# Patient Record
Sex: Female | Born: 1955 | Race: White | Hispanic: No | Marital: Married | Smoking: Former smoker
Health system: Southern US, Community
[De-identification: ages and names within clinical notes are randomized; demographics above are authoritative.]

## PROBLEM LIST (undated history)

## (undated) DIAGNOSIS — E785 Hyperlipidemia, unspecified: Secondary | ICD-10-CM

## (undated) DIAGNOSIS — J449 Chronic obstructive pulmonary disease, unspecified: Secondary | ICD-10-CM

## (undated) DIAGNOSIS — F32A Depression, unspecified: Secondary | ICD-10-CM

## (undated) DIAGNOSIS — I2510 Atherosclerotic heart disease of native coronary artery without angina pectoris: Secondary | ICD-10-CM

## (undated) DIAGNOSIS — F329 Major depressive disorder, single episode, unspecified: Secondary | ICD-10-CM

## (undated) DIAGNOSIS — J189 Pneumonia, unspecified organism: Secondary | ICD-10-CM

## (undated) DIAGNOSIS — A310 Pulmonary mycobacterial infection: Secondary | ICD-10-CM

## (undated) DIAGNOSIS — Z923 Personal history of irradiation: Secondary | ICD-10-CM

## (undated) DIAGNOSIS — F419 Anxiety disorder, unspecified: Secondary | ICD-10-CM

## (undated) DIAGNOSIS — R0989 Other specified symptoms and signs involving the circulatory and respiratory systems: Secondary | ICD-10-CM

## (undated) DIAGNOSIS — I219 Acute myocardial infarction, unspecified: Secondary | ICD-10-CM

## (undated) HISTORY — DX: Personal history of irradiation: Z92.3

## (undated) HISTORY — DX: Depression, unspecified: F32.A

## (undated) HISTORY — DX: Anxiety disorder, unspecified: F41.9

## (undated) HISTORY — DX: Major depressive disorder, single episode, unspecified: F32.9

## (undated) HISTORY — DX: Other specified symptoms and signs involving the circulatory and respiratory systems: R09.89

## (undated) HISTORY — DX: Chronic obstructive pulmonary disease, unspecified: J44.9

## (undated) HISTORY — DX: Hyperlipidemia, unspecified: E78.5

---
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
## 1984-04-04 HISTORY — PX: NASAL SINUS SURGERY: SHX719

## 2003-05-17 ENCOUNTER — Emergency Department (HOSPITAL_COMMUNITY): Admission: AD | Admit: 2003-05-17 | Discharge: 2003-05-17 | Payer: Self-pay | Admitting: Emergency Medicine

## 2008-11-03 ENCOUNTER — Encounter: Admission: RE | Admit: 2008-11-03 | Discharge: 2008-11-03 | Payer: Self-pay | Admitting: Internal Medicine

## 2010-06-03 ENCOUNTER — Ambulatory Visit (HOSPITAL_COMMUNITY)
Admission: RE | Admit: 2010-06-03 | Discharge: 2010-06-03 | Disposition: A | Discharge status: Home / Self Care | Source: Ambulatory Visit | Attending: Internal Medicine | Admitting: Internal Medicine

## 2010-06-03 ENCOUNTER — Other Ambulatory Visit (HOSPITAL_COMMUNITY)
Admission: RE | Admit: 2010-06-03 | Discharge: 2010-06-03 | Disposition: A | Discharge status: Home / Self Care | Source: Ambulatory Visit | Attending: Internal Medicine | Admitting: Internal Medicine

## 2010-06-03 ENCOUNTER — Other Ambulatory Visit (HOSPITAL_COMMUNITY): Payer: Self-pay | Admitting: Internal Medicine

## 2010-06-03 ENCOUNTER — Other Ambulatory Visit: Payer: Self-pay | Admitting: Internal Medicine

## 2010-06-03 DIAGNOSIS — J449 Chronic obstructive pulmonary disease, unspecified: Secondary | ICD-10-CM

## 2010-06-03 DIAGNOSIS — Z01419 Encounter for gynecological examination (general) (routine) without abnormal findings: Secondary | ICD-10-CM | POA: Insufficient documentation

## 2010-06-03 DIAGNOSIS — F17200 Nicotine dependence, unspecified, uncomplicated: Secondary | ICD-10-CM

## 2010-06-03 DIAGNOSIS — J4489 Other specified chronic obstructive pulmonary disease: Secondary | ICD-10-CM | POA: Insufficient documentation

## 2011-09-29 ENCOUNTER — Ambulatory Visit (HOSPITAL_COMMUNITY)
Admission: RE | Admit: 2011-09-29 | Discharge: 2011-09-29 | Disposition: A | Discharge status: Home / Self Care | Source: Ambulatory Visit | Attending: Internal Medicine | Admitting: Internal Medicine

## 2011-09-29 ENCOUNTER — Other Ambulatory Visit (HOSPITAL_COMMUNITY): Payer: Self-pay | Admitting: Internal Medicine

## 2011-09-29 DIAGNOSIS — J449 Chronic obstructive pulmonary disease, unspecified: Secondary | ICD-10-CM | POA: Insufficient documentation

## 2011-09-29 DIAGNOSIS — R634 Abnormal weight loss: Secondary | ICD-10-CM

## 2011-09-29 DIAGNOSIS — F17200 Nicotine dependence, unspecified, uncomplicated: Secondary | ICD-10-CM | POA: Insufficient documentation

## 2011-09-29 DIAGNOSIS — J4489 Other specified chronic obstructive pulmonary disease: Secondary | ICD-10-CM | POA: Insufficient documentation

## 2012-06-05 ENCOUNTER — Ambulatory Visit (HOSPITAL_COMMUNITY)
Admission: RE | Admit: 2012-06-05 | Discharge: 2012-06-05 | Disposition: A | Discharge status: Home / Self Care | Source: Ambulatory Visit | Attending: Internal Medicine | Admitting: Internal Medicine

## 2012-06-05 ENCOUNTER — Other Ambulatory Visit (HOSPITAL_COMMUNITY): Payer: Self-pay | Admitting: Internal Medicine

## 2012-06-05 DIAGNOSIS — R059 Cough, unspecified: Secondary | ICD-10-CM | POA: Insufficient documentation

## 2012-06-05 DIAGNOSIS — Z87891 Personal history of nicotine dependence: Secondary | ICD-10-CM | POA: Insufficient documentation

## 2012-06-05 DIAGNOSIS — J4489 Other specified chronic obstructive pulmonary disease: Secondary | ICD-10-CM | POA: Insufficient documentation

## 2012-06-05 DIAGNOSIS — R079 Chest pain, unspecified: Secondary | ICD-10-CM | POA: Insufficient documentation

## 2012-06-05 DIAGNOSIS — J449 Chronic obstructive pulmonary disease, unspecified: Secondary | ICD-10-CM | POA: Insufficient documentation

## 2012-06-06 ENCOUNTER — Encounter (HOSPITAL_COMMUNITY): Payer: Self-pay | Admitting: Emergency Medicine

## 2012-06-06 ENCOUNTER — Emergency Department (HOSPITAL_COMMUNITY)

## 2012-06-06 ENCOUNTER — Emergency Department (HOSPITAL_COMMUNITY)
Admission: EM | Admit: 2012-06-06 | Discharge: 2012-06-06 | Disposition: A | Discharge status: Home / Self Care | Attending: Emergency Medicine | Admitting: Emergency Medicine

## 2012-06-06 DIAGNOSIS — IMO0002 Reserved for concepts with insufficient information to code with codable children: Secondary | ICD-10-CM | POA: Insufficient documentation

## 2012-06-06 DIAGNOSIS — F17200 Nicotine dependence, unspecified, uncomplicated: Secondary | ICD-10-CM | POA: Insufficient documentation

## 2012-06-06 DIAGNOSIS — J029 Acute pharyngitis, unspecified: Secondary | ICD-10-CM | POA: Insufficient documentation

## 2012-06-06 DIAGNOSIS — J189 Pneumonia, unspecified organism: Secondary | ICD-10-CM

## 2012-06-06 DIAGNOSIS — J159 Unspecified bacterial pneumonia: Secondary | ICD-10-CM | POA: Insufficient documentation

## 2012-06-06 DIAGNOSIS — Z79899 Other long term (current) drug therapy: Secondary | ICD-10-CM | POA: Insufficient documentation

## 2012-06-06 DIAGNOSIS — R52 Pain, unspecified: Secondary | ICD-10-CM | POA: Insufficient documentation

## 2012-06-06 LAB — BASIC METABOLIC PANEL
BUN: 10 mg/dL (ref 6–23)
CO2: 25 mEq/L (ref 19–32)
Calcium: 9 mg/dL (ref 8.4–10.5)
GFR calc non Af Amer: >90 mL/min (ref >90)
Glucose, Bld: 105 mg/dL — ABNORMAL HIGH (ref 70–99)

## 2012-06-06 LAB — CBC WITH DIFFERENTIAL/PLATELET
Eosinophils Absolute: 0 10*3/uL (ref 0.0–0.7)
Eosinophils Relative: 0 % (ref 0–5)
HCT: 38.3 % (ref 36.0–46.0)
Hemoglobin: 13.2 g/dL (ref 12.0–15.0)
Lymphs Abs: 1.9 10*3/uL (ref 0.7–4.0)
MCH: 31 pg (ref 26.0–34.0)
MCV: 89.9 fL (ref 78.0–100.0)
Monocytes Relative: 9 % (ref 3–12)
RBC: 4.26 MIL/uL (ref 3.87–5.11)

## 2012-06-06 MED ORDER — KETOROLAC TROMETHAMINE 30 MG/ML IJ SOLN
30.0000 mg | Freq: Once | INTRAMUSCULAR | Status: AC
  Administered 2012-06-06: 30 mg via INTRAVENOUS
  Filled 2012-06-06: qty 1

## 2012-06-06 MED ORDER — FLUTICASONE-SALMETEROL 250-50 MCG/DOSE IN AEPB: 1.0000 | INHALATION_SPRAY | Freq: Every morning | RESPIRATORY_TRACT | Status: DC

## 2012-06-06 MED ORDER — KETOROLAC TROMETHAMINE 10 MG PO TABS: 10.0000 mg | ORAL_TABLET | Freq: Four times a day (QID) | ORAL | Status: DC | PRN

## 2012-06-06 MED ORDER — ONDANSETRON HCL 4 MG/2ML IJ SOLN
4.0000 mg | Freq: Once | INTRAMUSCULAR | Status: AC
  Administered 2012-06-06: 4 mg via INTRAVENOUS
  Filled 2012-06-06: qty 2

## 2012-06-06 MED ORDER — LEVOFLOXACIN IN D5W 750 MG/150ML IV SOLN
750.0000 mg | Freq: Once | INTRAVENOUS | Status: AC
  Administered 2012-06-06: 750 mg via INTRAVENOUS
  Filled 2012-06-06: qty 150

## 2012-06-06 MED ORDER — SODIUM CHLORIDE 0.9 % IV BOLUS (SEPSIS)
1000.0000 mL | Freq: Once | INTRAVENOUS | Status: AC
  Administered 2012-06-06: 1000 mL via INTRAVENOUS

## 2012-06-06 NOTE — ED Notes (Signed)
Pt states she was dx w/ pneumonia yesterday and was given oral abx.  Has taken two pills and states that she doesn't feel better yet.  C/o gen body aches.  Pain 6/10.

## 2012-06-06 NOTE — ED Provider Notes (Signed)
History     CSN: [id number]  Arrival date & time 06/06/12  1216   First MD Initiated Contact with Patient 06/06/12 1253      Chief Complaint  Patient presents with  . Pneumonia  . referred by PCP     (Consider location/radiation/quality/duration/timing/severity/associated sxs/prior treatment) HPI Comments: 57 year old female with a history of tobacco use but no other significant medical history who presents with a complaint of coughing and body aches. She has an associated sore throat. Her symptoms were gradual in onset 5 days ago, they have been persistent, her body aches involved are her lower back, bilateral thighs and bilateral upper extremities. She has no swelling, rashes. The cough has been productive of a green phlegm. Yesterday she had a chest x-ray which showed a possible left-sided pneumonia, she states that her family Dr. told her today that her blood work was abnormal and to seek further evaluation at the hospital. The patient states that she is just not improving though she does not feel worse than yesterday. She has been compliant with her Levaquin therapy.  The history is provided by the patient and the spouse.    History reviewed. No pertinent past medical history.  Past Surgical History  Procedure Laterality Date  . Nasal sinus surgery      History reviewed. No pertinent family history.  History  Substance Use Topics  . Smoking status: Current Every Day Smoker -- 0.50 packs/day  . Smokeless tobacco: Not on file  . Alcohol Use: Yes    OB History   Grav Para Term Preterm Abortions TAB SAB Ect Mult Living                  Review of Systems  All other systems reviewed and are negative.    Allergies  Review of patient's allergies indicates no known allergies.  Home Medications   Current Outpatient Rx  Name  Route  Sig  Dispense  Refill  . buPROPion (WELLBUTRIN XL) 300 MG 24 hr tablet   Oral   Take 300 mg by mouth every morning.         .  Cyanocobalamin (VITAMIN B 12 PO)   Oral   Take 1 tablet by mouth every morning.         . Fluticasone-Salmeterol (ADVAIR) 250-50 MCG/DOSE AEPB   Inhalation   Inhale 1 puff into the lungs every morning.         Marland Kitchen ibuprofen (ADVIL) 200 MG tablet   Oral   Take 400 mg by mouth every 8 (eight) hours as needed for fever.         . Multiple Vitamin (MULTIVITAMIN WITH MINERALS) TABS   Oral   Take 1 tablet by mouth every morning.         . vitamin C (ASCORBIC ACID) 500 MG tablet   Oral   Take 500 mg by mouth every morning.         Marland Kitchen ketorolac (TORADOL) 10 MG tablet   Oral   Take 1 tablet (10 mg total) by mouth every 6 (six) hours as needed for pain.   20 tablet   0     BP 134/80  Pulse 87  Temp(Src) 98.9 F (37.2 C) (Oral)  Resp 20  SpO2 95%  Physical Exam  Nursing note and vitals reviewed. Constitutional: She appears well-developed and well-nourished. No distress.  HENT:  Head: Normocephalic and atraumatic.  Mouth/Throat: No oropharyngeal exudate.  Nasal passages congested, pharynx erythematous without asymmetry exudate  hypertrophy  Eyes: Conjunctivae and EOM are normal. Pupils are equal, round, and reactive to light. Right eye exhibits no discharge. Left eye exhibits no discharge. No scleral icterus.  Neck: Normal range of motion. Neck supple. No JVD present. No thyromegaly present.  Cardiovascular: Normal rate, regular rhythm, normal heart sounds and intact distal pulses.  Exam reveals no gallop and no friction rub.   No murmur heard. Pulmonary/Chest: Effort normal and breath sounds normal. No respiratory distress. She has no wheezes. She has no rales.  Overall the lungs are very clear with only a slight expiratory wheeze.  Abdominal: Soft. Bowel sounds are normal. She exhibits no distension and no mass. There is no tenderness.  Musculoskeletal: Normal range of motion. She exhibits no edema and no tenderness.  Lymphadenopathy:    She has no cervical adenopathy.   Neurological: She is alert. Coordination normal.  Skin: Skin is warm and dry. No rash noted. No erythema.  Psychiatric: She has a normal mood and affect. Her behavior is normal.    ED Course  Procedures (including critical care time)  Labs Reviewed  CBC WITH DIFFERENTIAL - Abnormal; Notable for the following:    Platelets 148 (*)    All other components within normal limits  BASIC METABOLIC PANEL - Abnormal; Notable for the following:    Glucose, Bld 105 (*)    All other components within normal limits   Dg Chest 2 View  06/05/2012  *RADIOLOGY REPORT*  Clinical Data: Cough and left-sided chest pain.  History of tobacco use.  CHEST - 2 VIEW  Comparison: 09/29/2011  Findings: Stable hyperinflation and chronic lung disease.  There may be a very subtle infiltrate in the left infrahilar lung.  No edema or pleural fluid is identified.  Heart size and mediastinal contours are within normal limits.  IMPRESSION: Possible subtle acute infiltrate in the left infrahilar lung. Stable COPD.   Original Report Authenticated By: Irish Lack, M.D.      1. CAP (community acquired pneumonia)       MDM  The vital signs are overall very normal without fever or tachycardia or hypotension. Oxygenation 95% to 100% on room air. I have personally reviewed her x-ray from yesterday and find her to be a very small infiltrate in the left lung. Her blood work is totally normal without leukocytosis, renal dysfunction or electrolyte abnormalities. I discussed with the patient her findings and she appears stable for discharge. She has been given intravenous Levaquin prior to discharge as well as intravenous Toradol which has helped her pain significantly.  The patient appears stable to followup with her family Dr. I doubt worsening pneumonia and would consider that this is possibly a viral process such as influenza given her myalgias and upper respiratory symptoms.        Vida Roller, MD 06/06/12 [phone]

## 2012-06-06 NOTE — ED Notes (Signed)
Pharmacy at bedside

## 2012-07-19 ENCOUNTER — Other Ambulatory Visit (HOSPITAL_COMMUNITY): Payer: Self-pay | Admitting: Internal Medicine

## 2012-07-19 ENCOUNTER — Ambulatory Visit (HOSPITAL_COMMUNITY)
Admission: RE | Admit: 2012-07-19 | Discharge: 2012-07-19 | Disposition: A | Discharge status: Home / Self Care | Source: Ambulatory Visit | Attending: Internal Medicine | Admitting: Internal Medicine

## 2012-07-19 DIAGNOSIS — J189 Pneumonia, unspecified organism: Secondary | ICD-10-CM

## 2012-07-19 DIAGNOSIS — R0989 Other specified symptoms and signs involving the circulatory and respiratory systems: Secondary | ICD-10-CM | POA: Insufficient documentation

## 2012-07-19 DIAGNOSIS — R05 Cough: Secondary | ICD-10-CM | POA: Insufficient documentation

## 2012-07-19 DIAGNOSIS — J449 Chronic obstructive pulmonary disease, unspecified: Secondary | ICD-10-CM | POA: Insufficient documentation

## 2012-07-19 DIAGNOSIS — R059 Cough, unspecified: Secondary | ICD-10-CM | POA: Insufficient documentation

## 2012-07-19 DIAGNOSIS — J4489 Other specified chronic obstructive pulmonary disease: Secondary | ICD-10-CM | POA: Insufficient documentation

## 2012-07-19 DIAGNOSIS — F17200 Nicotine dependence, unspecified, uncomplicated: Secondary | ICD-10-CM | POA: Insufficient documentation

## 2013-06-01 ENCOUNTER — Other Ambulatory Visit: Payer: Self-pay | Admitting: Physician Assistant

## 2013-06-26 ENCOUNTER — Telehealth: Payer: Self-pay | Admitting: Internal Medicine

## 2013-06-26 NOTE — Telephone Encounter (Signed)
Left message for pt to call.

## 2013-06-26 NOTE — Telephone Encounter (Signed)
LEFT MESSAGE FOR PT TO [facility] WITH [place]

## 2013-07-14 DIAGNOSIS — J449 Chronic obstructive pulmonary disease, unspecified: Secondary | ICD-10-CM | POA: Insufficient documentation

## 2013-07-14 DIAGNOSIS — F419 Anxiety disorder, unspecified: Secondary | ICD-10-CM | POA: Insufficient documentation

## 2013-07-14 DIAGNOSIS — F329 Major depressive disorder, single episode, unspecified: Secondary | ICD-10-CM | POA: Insufficient documentation

## 2013-07-14 DIAGNOSIS — E785 Hyperlipidemia, unspecified: Secondary | ICD-10-CM | POA: Insufficient documentation

## 2013-07-14 DIAGNOSIS — R0989 Other specified symptoms and signs involving the circulatory and respiratory systems: Secondary | ICD-10-CM | POA: Insufficient documentation

## 2013-07-17 ENCOUNTER — Other Ambulatory Visit (HOSPITAL_COMMUNITY)
Admission: RE | Admit: 2013-07-17 | Discharge: 2013-07-17 | Disposition: A | Discharge status: Home / Self Care | Source: Ambulatory Visit | Attending: Physician Assistant | Admitting: Physician Assistant

## 2013-07-17 ENCOUNTER — Ambulatory Visit (INDEPENDENT_AMBULATORY_CARE_PROVIDER_SITE_OTHER): Admitting: Physician Assistant

## 2013-07-17 ENCOUNTER — Encounter: Payer: Self-pay | Admitting: Physician Assistant

## 2013-07-17 VITALS — BP 130/72 | HR 72 | Temp 97.9°F | Resp 16 | Ht 65.5 in | Wt 120.0 lb

## 2013-07-17 DIAGNOSIS — Z1151 Encounter for screening for human papillomavirus (HPV): Secondary | ICD-10-CM | POA: Insufficient documentation

## 2013-07-17 DIAGNOSIS — R0989 Other specified symptoms and signs involving the circulatory and respiratory systems: Secondary | ICD-10-CM

## 2013-07-17 DIAGNOSIS — Z1211 Encounter for screening for malignant neoplasm of colon: Secondary | ICD-10-CM

## 2013-07-17 DIAGNOSIS — Z79899 Other long term (current) drug therapy: Secondary | ICD-10-CM

## 2013-07-17 DIAGNOSIS — Z01419 Encounter for gynecological examination (general) (routine) without abnormal findings: Secondary | ICD-10-CM | POA: Insufficient documentation

## 2013-07-17 DIAGNOSIS — R5383 Other fatigue: Secondary | ICD-10-CM

## 2013-07-17 DIAGNOSIS — F17200 Nicotine dependence, unspecified, uncomplicated: Secondary | ICD-10-CM

## 2013-07-17 DIAGNOSIS — E785 Hyperlipidemia, unspecified: Secondary | ICD-10-CM

## 2013-07-17 DIAGNOSIS — N760 Acute vaginitis: Secondary | ICD-10-CM | POA: Insufficient documentation

## 2013-07-17 DIAGNOSIS — E559 Vitamin D deficiency, unspecified: Secondary | ICD-10-CM

## 2013-07-17 DIAGNOSIS — Z0000 Encounter for general adult medical examination without abnormal findings: Secondary | ICD-10-CM

## 2013-07-17 DIAGNOSIS — Z124 Encounter for screening for malignant neoplasm of cervix: Secondary | ICD-10-CM

## 2013-07-17 DIAGNOSIS — Z1159 Encounter for screening for other viral diseases: Secondary | ICD-10-CM

## 2013-07-17 DIAGNOSIS — R5381 Other malaise: Secondary | ICD-10-CM

## 2013-07-17 DIAGNOSIS — F329 Major depressive disorder, single episode, unspecified: Secondary | ICD-10-CM

## 2013-07-17 DIAGNOSIS — F3289 Other specified depressive episodes: Secondary | ICD-10-CM

## 2013-07-17 DIAGNOSIS — F32A Depression, unspecified: Secondary | ICD-10-CM

## 2013-07-17 DIAGNOSIS — I10 Essential (primary) hypertension: Secondary | ICD-10-CM

## 2013-07-17 DIAGNOSIS — J449 Chronic obstructive pulmonary disease, unspecified: Secondary | ICD-10-CM

## 2013-07-17 LAB — CBC WITH DIFFERENTIAL/PLATELET
BASOS ABS: 0 10*3/uL (ref 0.0–0.1)
Basophils Relative: 0 % (ref 0–1)
Eosinophils Absolute: 0.1 10*3/uL (ref 0.0–0.7)
Eosinophils Relative: 1 % (ref 0–5)
HEMATOCRIT: 41 % (ref 36.0–46.0)
Hemoglobin: 14.1 g/dL (ref 12.0–15.0)
LYMPHS ABS: 1.7 10*3/uL (ref 0.7–4.0)
LYMPHS PCT: 29 % (ref 12–46)
MCH: 30.4 pg (ref 26.0–34.0)
MCHC: 34.4 g/dL (ref 30.0–36.0)
MCV: 88.4 fL (ref 78.0–100.0)
MONO ABS: 0.4 10*3/uL (ref 0.1–1.0)
Monocytes Relative: 7 % (ref 3–12)
NEUTROS ABS: 3.7 10*3/uL (ref 1.7–7.7)
Neutrophils Relative %: 63 % (ref 43–77)
Platelets: 161 10*3/uL (ref 150–400)
RBC: 4.64 MIL/uL (ref 3.87–5.11)
RDW: 13.7 % (ref 11.5–15.5)
WBC: 5.9 10*3/uL (ref 4.0–10.5)

## 2013-07-17 LAB — HEPATIC FUNCTION PANEL
ALK PHOS: 80 U/L (ref 39–117)
ALT: 23 U/L (ref 0–35)
AST: 21 U/L (ref 0–37)
Albumin: 4.2 g/dL (ref 3.5–5.2)
BILIRUBIN INDIRECT: 0.5 mg/dL (ref 0.2–1.2)
BILIRUBIN TOTAL: 0.6 mg/dL (ref 0.2–1.2)
Bilirubin, Direct: 0.1 mg/dL (ref 0.0–0.3)
Total Protein: 6.9 g/dL (ref 6.0–8.3)

## 2013-07-17 LAB — LIPID PANEL
CHOL/HDL RATIO: 2.9 ratio
Cholesterol: 239 mg/dL — ABNORMAL HIGH (ref 0–200)
HDL: 82 mg/dL (ref >39)
LDL CALC: 145 mg/dL — AB (ref 0–99)
Triglycerides: 59 mg/dL (ref <150)
VLDL: 12 mg/dL (ref 0–40)

## 2013-07-17 LAB — VITAMIN B12: VITAMIN B 12: 1079 pg/mL — AB (ref 211–911)

## 2013-07-17 LAB — BASIC METABOLIC PANEL WITH GFR
BUN: 9 mg/dL (ref 6–23)
CHLORIDE: 104 meq/L (ref 96–112)
CO2: 28 mEq/L (ref 19–32)
Calcium: 9.3 mg/dL (ref 8.4–10.5)
Creat: 0.64 mg/dL (ref 0.50–1.10)
GFR, Est African American: >89 mL/min
Glucose, Bld: 93 mg/dL (ref 70–99)
POTASSIUM: 4 meq/L (ref 3.5–5.3)
Sodium: 140 mEq/L (ref 135–145)

## 2013-07-17 LAB — IRON AND TIBC
%SAT: 29 % (ref 20–55)
IRON: 79 ug/dL (ref 42–145)
TIBC: 273 ug/dL (ref 250–470)
UIBC: 194 ug/dL (ref 125–400)

## 2013-07-17 LAB — HEMOGLOBIN A1C
Hgb A1c MFr Bld: 5 % (ref <5.7)
Mean Plasma Glucose: 97 mg/dL (ref <117)

## 2013-07-17 LAB — MAGNESIUM: Magnesium: 1.9 mg/dL (ref 1.5–2.5)

## 2013-07-17 LAB — TSH: TSH: 2.038 u[IU]/mL (ref 0.350–4.500)

## 2013-07-17 LAB — FERRITIN: Ferritin: 170 ng/mL (ref 10–291)

## 2013-07-17 MED ORDER — TRIAMCINOLONE ACETONIDE 0.1 % EX OINT: 1.0000 "application " | TOPICAL_OINTMENT | Freq: Every day | CUTANEOUS | Status: DC

## 2013-07-17 NOTE — Progress Notes (Signed)
Complete Physical HPI 58 y.o. female  presents for a complete physical. 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 She denies chest pain, dizziness.  She states that for the past month she has been having some SOB when she goes to bed, she has been coughing some pale green sputum but very scant. Denies orthopnea, PND however she wakes up 4-5 times a night.  She is not on cholesterol medication. Her cholesterol is at goal. The cholesterol last visit was:  LDL 92, HDL 60.  Patient is on Vitamin D supplement.  42 She denies swelling at the end of her shit in her feet however she states that she has back and leg pain in the morning with some stiffness. Takes Advil occ helps. She has COPD, last CXR was 07/2012. She has stopped smoking since Aug 2nd.  Current Medications:  Current Outpatient Prescriptions on File Prior to Visit  Medication Sig Dispense Refill  . buPROPion (WELLBUTRIN XL) 300 MG 24 hr tablet TAKE 1 TABLET DAILY  30 tablet  0  . Cyanocobalamin (VITAMIN B 12 PO) Take 1 tablet by mouth every morning.      . Fluticasone-Salmeterol (ADVAIR) 250-50 MCG/DOSE AEPB Inhale 1 puff into the lungs every morning.  60 each  1  . ibuprofen (ADVIL) 200 MG tablet Take 400 mg by mouth every 8 (eight) hours as needed for fever.      . Multiple Vitamin (MULTIVITAMIN WITH MINERALS) TABS Take 1 tablet by mouth every morning.      . vitamin C (ASCORBIC ACID) 500 MG tablet Take 500 mg by mouth every morning.       No current facility-administered medications on file prior to visit.   Health Maintenance:   Tetanus:declines Pneumovax: declines Flu vaccine: declines Zostavax: declines Pap: 2012,  1 abnormal 40 years ago, due today MGM: 2014 due this year in July DEXA: Higher risk due to tobacco history and BMI, no fractures, family history will wait.  Colonoscopy: DUE EGD:  N/A  Allergies: No Known Allergies Medical History:  Past Medical History  Diagnosis Date  . Depression   . Anxiety   . COPD (chronic obstructive pulmonary disease)   . Hyperlipidemia   . Labile hypertension    Surgical History:  Past Surgical History  Procedure Laterality Date  . Nasal sinus surgery     Family History:  Family History  Problem Relation Age of Onset  . Heart attack Mother   . Emphysema Mother   . Heart attack Father   . Asthma Other    Social History:  History  Substance Use Topics  . Smoking status: Current Every Day Smoker -- 0.50 packs/day  . Smokeless tobacco: Not on file  . Alcohol Use: Yes     Review of Systems: [X]  = complains of  [ ]  = denies  General: Fatigue Valu.Nieves ] Fever [ ]  Chills [ ]  Weakness [ ]   Insomnia [X ]Weight change [ ]  Night sweats [ ]   Change in appetite [ ]  Eyes: Redness [ ]  Blurred vision [ ]  Diplopia [ ]  Discharge [ ]   ENT: Congestion [ ]  Sinus Pain [ ]  Post Nasal Drip [ ]  Sore Throat [ ]  Earache [ ]  hearing loss [ ]  Tinnitus [ ]  Snoring Valu.Nieves ]  Cardiac: Chest pain/pressure [ ]  SOB [ ]  Orthopnea [ ]   Palpitations [ ]   Paroxysmal nocturnal dyspnea[ ]  Claudication [ ]  Edema [ ]   Pulmonary: Cough Valu.Nieves ] Wheezing[ ]   SOB Valu.Nieves ]  Pleurisy [ ]   GI: Nausea [ ]  Vomiting[ ]  Dysphagia[ ]  Heartburn[ ]  Abdominal pain [ ]  Constipation [ ] ; Diarrhea [ ]  BRBPR [ ]  Melena[ ]  Bloating [ ]  Hemorrhoids [ ]   GU: Hematuria[ ]  Dysuria [ ]  Nocturia[ ]  Urgency [ ]   Hesitancy [ ]  Discharge [ ]  Frequency [ ]   Breast:  Breast lumps [ ]   nipple discharge [ ]    Neuro: Headaches[ ]  Vertigo[ ]  Paresthesias[ ]  Spasm [ ]  Speech changes [ ]  Incoordination [ ]   Ortho: Arthritis Valu.Nieves ] Joint pain Valu.Nieves ] Muscle pain [ X] Joint swelling [ ]  Back Pain Valu.Nieves ] Skin:  Rash [ ]   Pruritis [ ]  Change in skin lesion [ ]   Psych: Depression[ ]  Anxiety[ ]  Confusion [ ]  Memory loss [ ]   Heme/Lypmh: Bleeding [ ]  Bruising [ ]  Enlarged lymph nodes [ ]   Endocrine: Visual blurring [ ]   Paresthesia [ ]  Polyuria [ ]  Polydypsea [ ]    Heat/cold intolerance [ ]  Hypoglycemia [ ]   Physical Exam: Estimated body mass index is 19.66 kg/(m^2) as calculated from the following:   Height as of this encounter: 5' 5.5" (1.664 m).   Weight as of this encounter: 120 lb (54.432 kg). BP 130/72  Pulse 72  Temp(Src) 97.9 F (36.6 C)  Resp 16  Ht 5' 5.5" (1.664 m)  Wt 120 lb (54.432 kg)  BMI 19.66 kg/m2 General Appearance: Well nourished, in no apparent distress. Eyes: PERRLA, EOMs, conjunctiva no swelling or erythema, normal fundi and vessels. Sinuses: No Frontal/maxillary tenderness ENT/Mouth: Ext aud canals clear, normal light reflex with TMs without erythema, bulging.  Good dentition. No erythema, swelling, or exudate on post pharynx. Tonsils not swollen or erythematous. Hearing normal.  Neck: Supple, thyroid normal. No bruits Respiratory: Respiratory effort normal, , wheezing left sided, BS equal bilaterally without rales, rhonchi, or stridor. Cardio: RRR without murmurs, rubs or gallops. Brisk peripheral pulses without edema.  Chest: symmetric, with normal excursions and percussion. Breasts: Symmetric, without lumps, nipple discharge, retractions. Abdomen: Soft, +BS. Non tender, no guarding, rebound, hernias, masses, or organomegaly. .  Lymphatics: Non tender without lymphadenopathy.  Genitourinary: VULVA: normal appearing vulva with no masses, tenderness or lesions, vulvar hypopigmentation, VAGINA: normal appearing vagina with normal color and discharge, no lesions, vaginal discharge - white, malodorous and thick, CERVIX: normal appearing cervix without discharge or lesions, PAP: Pap smear done today, WET MOUNT done - results: pending. Musculoskeletal: Full ROM all peripheral extremities,5/5 strength, and normal gait. Skin: Warm, dry without rashes, lesions, ecchymosis.  Neuro: Cranial nerves intact, reflexes equal bilaterally. Normal muscle tone, no cerebellar symptoms. Sensation  intact.  Psych: Awake and oriented X 3, normal affect, Insight and Judgment appropriate.   EKG: WNL no changes.  Assessment and Plan: 1. Labile hypertension controlled - [place], RETROPERITNL ABD,  LTD  2. COPD (chronic obstructive pulmonary disease) Smoking cessation discussed - DG Chest 2 View; Future  3. Depression Continue meds  4. Hyperlipidemia -continue medications, check lipids, decrease fatty foods, increase activity.   5. Routine general medical examination at a health care facility - CBC with Differential - BASIC METABOLIC PANEL WITH GFR - Hepatic function panel - Lipid panel - TSH - Hemoglobin A1c - Vit D  25 hydroxy (rtn osteoporosis monitoring) - Urinalysis, Routine w reflex microscopic - Microalbumin / creatinine urine ratio - Vitamin B12 - Magnesium -  Iron and TIBC - Ferritin - EKG 12-Lead  6. Other malaise and fatigue Crowded mouth, HTN, fatigue, snoring, SOB, frequent awakenings - Ambulatory referral to Sleep Studies  7. Screening for cervical cancer - Cytology - PAP  8. Colon cancer screening - Ambulatory referral to Gastroenterology  9. Screening for viral disease - Hepatitis A antibody, total - Hepatitis B core antibody, total - Hepatitis B e antibody - Hepatitis B surface antibody - Hepatitis C antibody  10. Vaginitis and vulvovaginitis - Cytology - PAP   Discussed med's effects and SE's. Screening labs and tests as requested with regular follow-up as recommended.   [name] 9:23 AM

## 2013-07-18 ENCOUNTER — Encounter: Payer: Self-pay | Admitting: Gastroenterology

## 2013-07-18 LAB — URINALYSIS, ROUTINE W REFLEX MICROSCOPIC
Bilirubin Urine: NEGATIVE
GLUCOSE, UA: NEGATIVE mg/dL
HGB URINE DIPSTICK: NEGATIVE
KETONES UR: NEGATIVE mg/dL
Nitrite: NEGATIVE
PH: 6 (ref 5.0–8.0)
Protein, ur: NEGATIVE mg/dL
Specific Gravity, Urine: 1.007 (ref 1.005–1.030)
Urobilinogen, UA: 0.2 mg/dL (ref 0.0–1.0)

## 2013-07-18 LAB — HEPATITIS C ANTIBODY: HCV Ab: NEGATIVE

## 2013-07-18 LAB — HEPATITIS B CORE ANTIBODY, TOTAL: Hep B Core Total Ab: NONREACTIVE

## 2013-07-18 LAB — MICROALBUMIN / CREATININE URINE RATIO
Creatinine, Urine: 24 mg/dL
MICROALB/CREAT RATIO: 20.8 mg/g (ref 0.0–30.0)
Microalb, Ur: 0.5 mg/dL (ref 0.00–1.89)

## 2013-07-18 LAB — URINALYSIS, MICROSCOPIC ONLY
Bacteria, UA: NONE SEEN
CASTS: NONE SEEN
Crystals: NONE SEEN
SQUAMOUS EPITHELIAL / LPF: NONE SEEN

## 2013-07-18 LAB — VITAMIN D 25 HYDROXY (VIT D DEFICIENCY, FRACTURES): Vit D, 25-Hydroxy: 48 ng/mL (ref 30–89)

## 2013-07-18 LAB — HEPATITIS B SURFACE ANTIBODY,QUALITATIVE: Hep B S Ab: NEGATIVE

## 2013-07-18 LAB — HEPATITIS A ANTIBODY, TOTAL: HEP A TOTAL AB: REACTIVE — AB

## 2013-07-19 LAB — HEPATITIS B E ANTIBODY: Hepatitis Be Antibody: NONREACTIVE

## 2013-07-22 LAB — CERVICOVAGINAL ANCILLARY ONLY
Bacterial vaginitis: POSITIVE — AB
CANDIDA VAGINITIS: NEGATIVE

## 2013-07-23 ENCOUNTER — Other Ambulatory Visit: Payer: Self-pay

## 2013-07-23 MED ORDER — METRONIDAZOLE 500 MG PO TABS: ORAL_TABLET | ORAL | Status: DC

## 2013-07-23 MED ORDER — FLUTICASONE-SALMETEROL 250-50 MCG/DOSE IN AEPB: 1.0000 | INHALATION_SPRAY | Freq: Every morning | RESPIRATORY_TRACT | Status: DC

## 2013-07-23 NOTE — Addendum Note (Signed)
Addended by: [name] R on: 07/23/2013 08:42 AM   Modules accepted: Orders

## 2013-08-14 ENCOUNTER — Ambulatory Visit (AMBULATORY_SURGERY_CENTER): Payer: Self-pay | Admitting: *Deleted

## 2013-08-14 VITALS — Ht 64.0 in | Wt 118.6 lb

## 2013-08-14 DIAGNOSIS — Z1211 Encounter for screening for malignant neoplasm of colon: Secondary | ICD-10-CM

## 2013-08-14 MED ORDER — MOVIPREP 100 G PO SOLR: ORAL | Status: DC

## 2013-08-14 NOTE — Progress Notes (Signed)
No allergies to eggs or soy. No problems with anesthesia.  Pt given Emmi instructions for colonoscopy  No oxygen use  No diet drug use  

## 2013-08-28 ENCOUNTER — Ambulatory Visit (AMBULATORY_SURGERY_CENTER): Admitting: Gastroenterology

## 2013-08-28 ENCOUNTER — Encounter: Payer: Self-pay | Admitting: Gastroenterology

## 2013-08-28 VITALS — BP 136/67 | HR 56 | Temp 98.6°F | Resp 40 | Ht 60.0 in | Wt 118.0 lb

## 2013-08-28 DIAGNOSIS — D126 Benign neoplasm of colon, unspecified: Secondary | ICD-10-CM

## 2013-08-28 DIAGNOSIS — Z1211 Encounter for screening for malignant neoplasm of colon: Secondary | ICD-10-CM

## 2013-08-28 MED ORDER — SODIUM CHLORIDE 0.9 % IV SOLN: 500.0000 mL | INTRAVENOUS | Status: DC

## 2013-08-28 NOTE — Progress Notes (Signed)
Called to room to assist during endoscopic procedure.  Patient ID and intended procedure confirmed with present staff. Received instructions for my participation in the procedure from the performing physician.  

## 2013-08-28 NOTE — Op Note (Signed)
[place]  [address] [place], [address]   COLONOSCOPY PROCEDURE REPORT  PATIENT: Katrina Campbell, Katrina Campbell  MR#: [id number] BIRTHDATE: 1955/09/15 , 79  yrs. old GENDER: Female ENDOSCOPIST: Milus Banister, MD REFERRED MP:NTIRWER Melford Aase, M.D. PROCEDURE DATE:  08/28/2013 PROCEDURE:   Colonoscopy with snare polypectomy First Screening Colonoscopy - Avg.  risk and is 50 yrs.  old or older Yes.  Prior Negative Screening - Now for repeat screening. N/A  History of Adenoma - Now for follow-up colonoscopy & has been > or = to 3 yrs.  N/A  Polyps Removed Today? Yes. ASA CLASS:   Class II INDICATIONS:average risk screening. MEDICATIONS: MAC sedation, administered by CRNA and Propofol (Diprivan) 270 mg IV  DESCRIPTION OF PROCEDURE:   After the risks benefits and alternatives of the procedure were thoroughly explained, informed consent was obtained.  A digital rectal exam revealed no abnormalities of the rectum.   The [id number]  endoscope was introduced through the anus and advanced to the cecum, which was identified by both the appendix and ileocecal valve. No adverse events experienced.   The quality of the prep was excellent.  The instrument was then slowly withdrawn as the colon was fully examined.   COLON FINDINGS: One polyp was found, removed and sent to pathology. This was sessile, 38mm across, located in transverse segment, removed with cold snare.  The examination was otherwise normal. Retroflexed views revealed no abnormalities. The time to cecum=5 minutes 20 seconds.  Withdrawal time=9 minutes 41 seconds.  The scope was withdrawn and the procedure completed. COMPLICATIONS: There were no complications.  ENDOSCOPIC IMPRESSION: One polyp was found, removed and sent to pathology. The examination was otherwise normal.  RECOMMENDATIONS: If the polyp(s) removed today are proven to be adenomatous (pre-cancerous) polyps, you will need a repeat colonoscopy in  5 years.  Otherwise you should continue to follow colorectal cancer screening guidelines for "routine risk" patients with colonoscopy in 10 years.  You will receive a letter within 1-2 weeks with the results of your biopsy as well as final recommendations.  Please call my office if you have not received a letter after 3 weeks.   eSigned:  Milus Banister, MD 08/28/2013 10:05 AM

## 2013-08-28 NOTE — Patient Instructions (Signed)
Discharge instructions given with verbal understanding. Handout on polyps. Resume previous medications. YOU HAD AN ENDOSCOPIC PROCEDURE TODAY AT [facility]: Refer to the procedure report that was given to you for any specific questions about what was found during the examination.  If the procedure report does not answer your questions, please call your gastroenterologist to clarify.  If you requested that your care partner not be given the details of your procedure findings, then the procedure report has been included in a sealed envelope for you to review at your convenience later.  YOU SHOULD EXPECT: Some feelings of bloating in the abdomen. Passage of more gas than usual.  Walking can help get rid of the air that was put into your GI tract during the procedure and reduce the bloating. If you had a lower endoscopy (such as a colonoscopy or flexible sigmoidoscopy) you may notice spotting of blood in your stool or on the toilet paper. If you underwent a bowel prep for your procedure, then you may not have a normal bowel movement for a few days.  DIET: Your first meal following the procedure should be a light meal and then it is ok to progress to your normal diet.  A half-sandwich or bowl of soup is an example of a good first meal.  Heavy or fried foods are harder to digest and may make you feel nauseous or bloated.  Likewise meals heavy in dairy and vegetables can cause extra gas to form and this can also increase the bloating.  Drink plenty of fluids but you should avoid alcoholic beverages for 24 hours.  ACTIVITY: Your care partner should take you home directly after the procedure.  You should plan to take it easy, moving slowly for the rest of the day.  You can resume normal activity the day after the procedure however you should NOT DRIVE or use heavy machinery for 24 hours (because of the sedation medicines used during the test).    SYMPTOMS TO REPORT IMMEDIATELY: A  gastroenterologist can be reached at any hour.  During normal business hours, 8:30 AM to 5:00 PM Monday through Friday, call [phone].  After hours and on weekends, please call the GI answering service at [phone] who will take a message and have the physician on call contact you.   Following lower endoscopy (colonoscopy or flexible sigmoidoscopy):  Excessive amounts of blood in the stool  Significant tenderness or worsening of abdominal pains  Swelling of the abdomen that is new, acute  Fever of 100F or higher  FOLLOW UP: If any biopsies were taken you will be contacted by phone or by letter within the next 1-3 weeks.  Call your gastroenterologist if you have not heard about the biopsies in 3 weeks.  Our staff will call the home number listed on your records the next business day following your procedure to check on you and address any questions or concerns that you may have at that time regarding the information given to you following your procedure. This is a courtesy call and so if there is no answer at the home number and we have not heard from you through the emergency physician on call, we will assume that you have returned to your regular daily activities without incident.  SIGNATURES/CONFIDENTIALITY: You and/or your care partner have signed paperwork which will be entered into your electronic medical record.  These signatures attest to the fact that that the information above on your After Visit Summary has been  reviewed and is understood.  Full responsibility of the confidentiality of this discharge information lies with you and/or your care-partner.

## 2013-08-28 NOTE — Progress Notes (Signed)
Pt stable to RR 

## 2013-08-29 ENCOUNTER — Telehealth: Payer: Self-pay | Admitting: *Deleted

## 2013-08-29 NOTE — Telephone Encounter (Signed)
  Follow up Call-  Call back number 08/28/2013  Post procedure Call Back phone  # [phone]  Permission to leave phone message Yes    [facility]

## 2013-09-03 ENCOUNTER — Encounter: Payer: Self-pay | Admitting: Gastroenterology

## 2013-12-13 ENCOUNTER — Other Ambulatory Visit: Payer: Self-pay | Admitting: Physician Assistant

## 2014-01-13 ENCOUNTER — Ambulatory Visit (INDEPENDENT_AMBULATORY_CARE_PROVIDER_SITE_OTHER): Admitting: Internal Medicine

## 2014-01-13 ENCOUNTER — Encounter: Payer: Self-pay | Admitting: Internal Medicine

## 2014-01-13 VITALS — BP 132/80 | HR 68 | Temp 98.2°F | Resp 18 | Ht 65.5 in | Wt 123.0 lb

## 2014-01-13 DIAGNOSIS — J4541 Moderate persistent asthma with (acute) exacerbation: Secondary | ICD-10-CM

## 2014-01-13 MED ORDER — PREDNISONE 20 MG PO TABS: ORAL_TABLET | ORAL | Status: DC

## 2014-01-13 MED ORDER — UMECLIDINIUM BROMIDE 62.5 MCG/INH IN AEPB: 1.0000 | INHALATION_SPRAY | Freq: Every day | RESPIRATORY_TRACT | Status: DC

## 2014-01-13 NOTE — Progress Notes (Signed)
   Subjective:    Patient ID: Katrina Campbell, female    DOB: 01-09-1956, 58 y.o.   MRN: [id number]  HPI very nice 58 yo reformed smoker treated at an Urgent care ~ 1  mo ago w/Levaquin(?) and more recently given an Rx for a Z pak & a cephalosporin. Describes a productive cough.   PMHx (+) COPD, HLD & labile HTN Meds/All - reviewed  Review of Systems  All systems reviewed negative except as above.   Objective:   Physical Exam  BP 132/80  Pulse 68  Temp 98.2 F Resp 18  Ht 5' 5.5"   Wt 123 lb  BMI 20.15 kg/m2  O2 Sat = 94-96 %  HEENT - Eac's patent. TM's Nl. EOM's full. PERRLA. NasoOroPharynx clear. Neck - supple. Nl Thyroid. Carotids 2+ & No bruits, nodes, JVD Chest - Bilat coarse Rales, rhonchi and  Wheezes. No resp Distress.  Cor - Nl HS. RRR w/o sig MGR. PP 1(+). No edema. Abd - No palpable organomegaly, masses or tenderness. BS nl. MS- FROM w/o deformities. Muscle power, tone and bulk Nl. Gait Nl. Neuro - No obvious Cr N abnormalities. Sensory, motor and Cerebellar functions appear Nl w/o focal abnormalities. Psyche - Mental status normal & appropriate.  No delusions, ideations or obvious mood abnormalities.  Assessment & Plan:   1. Asthmatic bronchitis with exacerbation, moderate persistent  - advised start her 2 antibiotics  and given Rx - Prednisone 20 mg #20 Pulse/taper  & Sx/RX - Incruse Ellipta  - ROW - 2 weeks

## 2014-01-13 NOTE — Patient Instructions (Signed)
Chronic Asthmatic Bronchitis Chronic asthmatic bronchitis is a complication of persistent asthma. After a period of time with asthma, some people develop airflow obstruction that is present all the time, even when not having an asthma attack.There is also persistent inflammation of the airways, and the bronchial tubes produce more mucus. Chronic asthmatic bronchitis usually is a permanent problem with the lungs. CAUSES  Chronic asthmatic bronchitis happens most often in people who have asthma and also smoke cigarettes. Occasionally, it can happen to a person with long-standing or severe asthma even if the person is not a smoker. SIGNS AND SYMPTOMS  Chronic asthmatic bronchitis usually causes symptoms of both asthma and chronic bronchitis, including:   Coughing.  Increased sputum production.  Wheezing and shortness of breath.  Chest discomfort.  Recurring infections. DIAGNOSIS  Your health care provider will take a medical history and perform a physical exam. Chronic asthmatic bronchitis is suspected when a person with asthma has abnormal results on breathing tests (pulmonary function tests) even when breathing symptoms are at their best. Other tests, such as a chest X-ray, may be performed to rule out other conditions.  TREATMENT  Treatment involves controlling symptoms with medicine and lifestyle changes.  Your health care provider may prescribe asthma medicines, including inhaler and nebulizer medicines.  Infection can be treated with medicine to kill germs (antibiotics). Serious infections may require hospitalization. These can include:  Pneumonia.  Sinus infections.  Acute bronchitis.   Preventing infection and hospitalization is very important. Get an influenza vaccination every year as directed by your health care provider. Ask your health care provider whether you need a pneumonia vaccine.  Ask your health care provider whether you would benefit from a pulmonary  rehabilitation program. HOME CARE INSTRUCTIONS  Take medicines only as directed by your health care provider.  If you are a cigarette smoker, the most important thing that you can do is quit. Talk to your health care provider for help with quitting smoking.  Avoid pollen, dust, animal dander, molds, smoke, and other things that cause attacks.  Regular exercise is very important to help you feel better. Discuss possible exercise routines with your health care provider.  If animal dander is the cause of asthma, you may not be able to keep pets.  It is important that you:  Become educated about your medical condition.  Participate in maintaining wellness.  Seek medical care as directed. Delay in seeking medical care could cause permanent injury and may be a risk to your life. SEEK MEDICAL CARE IF:  You have wheezing and shortness of breath even if taking medicine to prevent attacks.  You have muscle aches, chest pain, or thickening of sputum.  Your sputum changes from clear or white to yellow, green, gray, or bloody. SEEK IMMEDIATE MEDICAL CARE IF:  Your usual medicines do not stop your wheezing.  You have increased coughing or shortness of breath or both.  You have increased difficulty breathing.  You have any problems from the medicine you are taking, such as a rash, itching, swelling, or trouble breathing. MAKE SURE YOU:   Understand these instructions.  Will watch your condition.  Will get help right away if you are not doing well or get worse. Document Released: 01/06/2006 Document Revised: 08/05/2013 Document Reviewed: 04/29/2013 [facility] Patient Information 2015 [place], [place]. This information is not intended to replace advice given to you by your health care provider. Make sure you discuss any questions you have with your health care provider.

## 2014-01-16 ENCOUNTER — Other Ambulatory Visit: Payer: Self-pay | Admitting: Internal Medicine

## 2014-01-16 MED ORDER — UMECLIDINIUM BROMIDE 62.5 MCG/INH IN AEPB
62.5000 ug | INHALATION_SPRAY | Freq: Every day | RESPIRATORY_TRACT | Status: AC
Start: 2014-01-16 — End: 2014-07-18

## 2014-01-22 ENCOUNTER — Other Ambulatory Visit: Payer: Self-pay | Admitting: *Deleted

## 2014-01-22 MED ORDER — BUPROPION HCL ER (XL) 300 MG PO TB24: ORAL_TABLET | ORAL | Status: DC

## 2014-01-23 ENCOUNTER — Telehealth: Payer: Self-pay | Admitting: *Deleted

## 2014-01-23 NOTE — Telephone Encounter (Signed)
Patient advised of normal CT Chest with no sign of cancer.

## 2014-01-28 ENCOUNTER — Ambulatory Visit (INDEPENDENT_AMBULATORY_CARE_PROVIDER_SITE_OTHER): Admitting: Internal Medicine

## 2014-01-28 ENCOUNTER — Encounter: Payer: Self-pay | Admitting: Internal Medicine

## 2014-01-28 VITALS — BP 112/70 | HR 68 | Temp 98.1°F | Resp 16 | Ht 65.5 in | Wt 127.8 lb

## 2014-01-28 DIAGNOSIS — B370 Candidal stomatitis: Secondary | ICD-10-CM

## 2014-01-28 DIAGNOSIS — J4531 Mild persistent asthma with (acute) exacerbation: Secondary | ICD-10-CM

## 2014-01-28 DIAGNOSIS — J042 Acute laryngotracheitis: Secondary | ICD-10-CM

## 2014-01-28 MED ORDER — AZITHROMYCIN 250 MG PO TABS: ORAL_TABLET | ORAL | Status: DC

## 2014-01-28 MED ORDER — FLUCONAZOLE 100 MG PO TABS: 100.0000 mg | ORAL_TABLET | Freq: Every day | ORAL | Status: AC

## 2014-01-28 MED ORDER — FLUTICASONE-SALMETEROL 250-50 MCG/DOSE IN AEPB: 1.0000 | INHALATION_SPRAY | Freq: Two times a day (BID) | RESPIRATORY_TRACT | Status: DC

## 2014-01-28 MED ORDER — PREDNISONE 20 MG PO TABS: ORAL_TABLET | ORAL | Status: DC

## 2014-01-28 NOTE — Progress Notes (Signed)
   Subjective:    Patient ID: Katrina Campbell, female    DOB: [birth year], 58 y.o.   MRN: [id number]  HPI  Patient presents for  Week f/u after treatment with a Z pak and Keflex and persistent dry non productive 'wheezy' cough. ! Month prior to that she had been treated with Levaquin. Denies fever chills or sputum pdn. CT scan Chest done 01/22/2014 showed COPD w/o pulmonary nodules. Patient relates smoking cessation about 1 year ago ( Has hx/o smoking ~ 1&1/2 ppd from age 14 yo 24 yrs til quitting age 58 = 34 Pk Yrs.) Was seen recently by Dr Redmond Baseman for ENT exam with Dx/o Ritta Slot and prescribed a "liquid" for Thrush.   Medication Sig  . buPROPion (WELLBUTRIN XL) 300 MG 24 hr tablet TAKE 1 TABLET DAILY  . calcium carbonate (OS-CAL) 600 MG TABS tablet Take 600 mg by mouth daily with breakfast.  . Cyanocobalamin (VITAMIN B 12 PO) Take 1 tablet by mouth every morning.  . fluticasone (FLONASE) 50 MCG/ACT nasal spray Place 1 spray into both nostrils 2 (two) times daily as needed for allergies or rhinitis.  Marland Kitchen guaiFENesin (MUCINEX) 600 MG 12 hr tablet Take by mouth daily.  Marland Kitchen ibuprofen (ADVIL) 200 MG tablet Take 400 mg by mouth every 8 (eight) hours as needed for fever.  . Multiple Vitamin (MULTIVITAMIN WITH MINERALS) TABS Take 1 tablet by mouth every morning.  Marland Kitchen Umeclidinium Bromide (INCRUSE ELLIPTA) 62.5 MCG/INH AEPB Inhale 62.5 mcg into the lungs daily.  . vitamin C (ASCORBIC ACID) 500 MG tablet Take 500 mg by mouth every morning.  . predniSONE (DELTASONE) 20 MG tablet Completed   No Known Allergies  Past Medical History  Diagnosis Date  . Depression   . Anxiety   . COPD (chronic obstructive pulmonary disease)   . Hyperlipidemia   . Labile hypertension    Review of Systems non contributory to above  Objective:   Physical Exam  BP 112/70  Pulse 68  Temp(Src) 98.1 F (36.7 C) (Temporal)  Resp 16  Ht 5' 5.5" (1.664 m)  Wt 127 lb 12.8 oz (57.97 kg)  BMI 20.94 kg/m2 Raspy wheezy dry cough and  slight hoarseness. O2 sat =  93%  HEENT - Eac's patent. TM's Nl. EOM's full. PERRLA. NasoOroPharynx with fer patches of Thrush. Neck - supple. Nl Thyroid. Carotids 2+ & No bruits, nodes, JVD Chest - Decreased BS w/ scattered dry rales/rhonchi and few fine forced post tussive end expiratory wheezes wheezes. Cor - Nl HS. RRR w/o sig MGR. PP 1(+). No edema. Abd - No palpable organomegaly, masses or tenderness. BS nl. MS- FROM w/o deformities. Muscle power, tone and bulk Nl. Gait Nl. Neuro - No obvious Cr N abnormalities. Sensory, motor and Cerebellar functions appear Nl w/o focal abnormalities. Psyche - Mental status normal & appropriate.  .  Assessment & Plan:   1. Acute laryngitis and tracheitis  - Rx Zpak  Repeat and Prednisone pulse/taper  2. Asthmatic bronchitis, mild persistent, with acute exacerbation  - Continue Advair & Incruse   3. Thrush, oral  - Rx Diflucan 100 mg # 15   ROV 2 weeks to reassess

## 2014-02-11 ENCOUNTER — Encounter: Payer: Self-pay | Admitting: Internal Medicine

## 2014-02-11 ENCOUNTER — Ambulatory Visit (INDEPENDENT_AMBULATORY_CARE_PROVIDER_SITE_OTHER): Admitting: Internal Medicine

## 2014-02-11 VITALS — BP 126/72 | HR 72 | Temp 98.1°F | Resp 16 | Ht 65.5 in | Wt 129.2 lb

## 2014-02-11 DIAGNOSIS — J4541 Moderate persistent asthma with (acute) exacerbation: Secondary | ICD-10-CM

## 2014-02-11 DIAGNOSIS — I10 Essential (primary) hypertension: Secondary | ICD-10-CM

## 2014-02-11 DIAGNOSIS — R0989 Other specified symptoms and signs involving the circulatory and respiratory systems: Secondary | ICD-10-CM

## 2014-02-11 NOTE — Progress Notes (Signed)
   Subjective:    Patient ID: Katrina Campbell, female    DOB: 1956/02/11, 58 y.o.   MRN: [id number]  HPI A very nice 58 yo MWF returning for recheck after protracted exacerbation(s) of bronchitis and 3 courses of antibiotics. Now 2 weeks post treatment with a Z pak/prednisone  and institution of Incruse Elliptica- she is markedly improved with occas dry cough, no sputum pdn and no apparent wheezing. Note patient did stop smoking Aug 2014. Recent CXR showed only COPD.   Medication Sig  . buPROPion XL)300 MG 24 hr tablet TAKE 1 TABLET DAILY  . OS-CAL 600 MG  Take 600 mg by mouth daily with breakfast.  . Cyanocobalamin (VITAMIN B 12 PO) Take 1 tablet by mouth every morning.  . fluticasone (FLONASE)nasal spray Place 1 spray into both nostrils 2  times daily   . ADVAIR DISKUS 250-50  Inhale 1 puff into the lungs 2 (two) times daily.  Marland Kitchen MUCINEX 600 MG 12 hr tablet Take by mouth daily.  Marland Kitchen ibuprofen 200 MG tablet Take 400 mg by mouth every 8 (eight) hours as needed for fever.  . MULTIVITAMIN WITH MINERALS Take 1 tablet by mouth every morning.  Marland Kitchen Umeclidinium Br / INCRUSE ELLIPTA 62.5  Inhale 62.5 mcg into the lungs daily.  . vitamin C (ASCORBIC ACID) 500 MG tablet Take 500 mg by mouth every morning.   No Known Allergies   Past Medical History  Diagnosis Date  . Depression   . Anxiety   . COPD (chronic obstructive pulmonary disease)   . Hyperlipidemia   . Labile hypertension    Review of Systems Negative except as above  Objective:   Physical Exam BP 126/72 mmHg  Pulse 72  Temp(Src) 98.1 F (36.7 C)  Resp 16  Ht 5' 5.5" (1.664 m)  Wt 129 lb 3.2 oz (58.605 kg)  BMI 21.17 kg/m2  HEENT - Negative & normal. Neck - supple. Nl Thyroid. Carotids 2+ & No bruits, nodes, JVD Chest - BS decreased w/o rales, rhonchi, wheezes. Cor - Nl HS. RRR w/o sig MGR. PP 1(+). No edema. MS- FROM w/o deformities. Muscle power, tone and bulk Nl. Gait Nl. Neuro - No obvious w/o focal abnormalities. Psyche -  Mental status normal & appropriate.   Assessment & Plan:   1. Labile hypertension   2. Asthmatic bronchitis with exacerbation, moderate persistent  - Continue meds same and ROV 6 months or prn

## 2014-02-20 ENCOUNTER — Emergency Department (INDEPENDENT_AMBULATORY_CARE_PROVIDER_SITE_OTHER)

## 2014-02-20 ENCOUNTER — Emergency Department (HOSPITAL_COMMUNITY)
Admission: EM | Admit: 2014-02-20 | Discharge: 2014-02-20 | Disposition: A | Discharge status: Home / Self Care | Source: Home / Self Care | Attending: Emergency Medicine | Admitting: Emergency Medicine

## 2014-02-20 ENCOUNTER — Encounter (HOSPITAL_COMMUNITY): Payer: Self-pay | Admitting: Emergency Medicine

## 2014-02-20 DIAGNOSIS — M25579 Pain in unspecified ankle and joints of unspecified foot: Secondary | ICD-10-CM

## 2014-02-20 DIAGNOSIS — M779 Enthesopathy, unspecified: Secondary | ICD-10-CM

## 2014-02-20 MED ORDER — TRAMADOL HCL 50 MG PO TABS: 100.0000 mg | ORAL_TABLET | Freq: Three times a day (TID) | ORAL | Status: DC | PRN

## 2014-02-20 MED ORDER — MELOXICAM 15 MG PO TABS: 15.0000 mg | ORAL_TABLET | Freq: Every day | ORAL | Status: DC

## 2014-02-20 NOTE — Discharge Instructions (Signed)

## 2014-02-20 NOTE — ED Notes (Signed)
Pt states that she was walking about 4 days ago and felt this sharp pain in her ankle that radiated to her leg. Pt states that he ankle started swelling about 2 days ago per pt.

## 2014-02-20 NOTE — ED Provider Notes (Signed)
Chief Complaint   Ankle Pain   History of Present Illness   Katrina Campbell is a 58 year old female who was at work 4 days ago when she developed pain over her right lateral malleolus. She denies any injury. She did not twist her ankle, did not hear a pop. The pain began spontaneously. It's been somewhat swollen since then. It radiates up into the pretibial area and in the calf. The pain is worse with weightbearing and better if she gets off her feet. The patient just finished several courses of antibiotics for pneumonia and was also taking steroids at the same time. She's not sure exactly what the antibiotics were.  Review of Systems   Other than as noted above, the patient denies any of the following symptoms: Systemic:  No fevers or chills.   Musculoskeletal:  No joint pain or swelling, back pain, or neck pain. Neurological:  No muscular weakness or paresthesias.  [place]   Past medical history, family history, social history, meds, and allergies were reviewed.   Physical Examination     Vital signs:  BP 152/74 mmHg  Pulse 64  Temp(Src) 98.5 F (36.9 C) (Oral)  Resp 20  SpO2 100% Gen:  Alert and oriented times 3.  In no distress. Musculoskeletal: Exam of the ankle reveals there is no pain to palpation over the lateral malleolus itself but she has pain both anteriorly and posteriorly. There is no obvious swelling. The ankle has a full range of motion with pain on movement. She has pain on resisted flexion, extension, and internal and external rotation. Anterior drawer sign negative.  Talar tilt negative. Squeeze test negative. Achilles tendon, peroneal tendon, and tibialis posterior were intact. Otherwise, all joints had a full a ROM with no swelling, bruising or deformity.  No edema, pulses full. Extremities were warm and pink.  Capillary refill was brisk. There was no calf tenderness. Homans sign was negative. Skin:  Clear, warm and dry.  No rash. Neuro:  Alert and oriented times 3.   Muscle strength was normal.  Sensation was intact to light touch.   Radiology   Dg Ankle Complete Right  02/20/2014   CLINICAL DATA:  Fall at work 5 days ago. Persistent ankle pain and swelling. Initial encounter.  EXAM: RIGHT ANKLE - COMPLETE 3+ VIEW  COMPARISON:  None.  FINDINGS: There is no evidence of fracture, dislocation, or joint effusion. There is no evidence of arthropathy or other focal bone abnormality. Soft tissues are unremarkable.  IMPRESSION: Negative.   Electronically Signed   By: Earle Gell M.D.   On: 02/20/2014 16:52    I reviewed the images independently and personally and concur with the radiologist's findings.  Course in [facility] [place]   The patient was placed in a cam walker and given crutches.  Assessment   The primary encounter diagnosis was Tendonitis. A diagnosis of Ankle pain was also pertinent to this visit.  Differential diagnosis is stress fracture, tendinitis, or tendon rupture.  Plan     1.  Meds:  The following meds were prescribed:   Discharge Medication List as of 02/20/2014  5:10 PM    START taking these medications   Details  meloxicam (MOBIC) 15 MG tablet Take 1 tablet (15 mg total) by mouth daily., Starting 02/20/2014, Until Discontinued, Normal    traMADol (ULTRAM) 50 MG tablet Take 2 tablets (100 mg total) by mouth every 8 (eight) hours as needed., Starting 02/20/2014, Until Discontinued, Print  2.  Patient Education/Counseling:  The patient was given appropriate handouts, self care instructions, including rest and activity, elevation, application of ice and compression, and instructed in pain control.  No weightbearing or work for right now and follow-up with orthopedics early next week.  3.  Follow up:  The patient was told to follow up here if no better in 3 to 4 days, or sooner if becoming worse in any way, and given some red flag symptoms such as increasing pain or neurological symptoms which would prompt immediate return.       Harden Mo, MD 02/20/14 [phone]

## 2014-02-21 ENCOUNTER — Encounter: Payer: Self-pay | Admitting: *Deleted

## 2014-02-24 ENCOUNTER — Other Ambulatory Visit: Payer: Self-pay | Admitting: *Deleted

## 2014-02-24 MED ORDER — FLUTICASONE-SALMETEROL 250-50 MCG/DOSE IN AEPB: 1.0000 | INHALATION_SPRAY | Freq: Two times a day (BID) | RESPIRATORY_TRACT | Status: DC

## 2014-06-09 ENCOUNTER — Telehealth: Payer: Self-pay | Admitting: *Deleted

## 2014-06-09 NOTE — Telephone Encounter (Signed)
PER pts ortho needs to schedule a BMD due to a fracture back in Nov.... He suggests this but Melissa said back at cpe she had needed one also  Needs MGM also will schedule both after 2 if possible & call pt.  Pt is already a pt at [facility]  Patient =  [phone]

## 2014-06-10 ENCOUNTER — Other Ambulatory Visit: Payer: Self-pay | Admitting: *Deleted

## 2014-06-10 DIAGNOSIS — Z1231 Encounter for screening mammogram for malignant neoplasm of breast: Secondary | ICD-10-CM

## 2014-06-10 DIAGNOSIS — Z780 Asymptomatic menopausal state: Secondary | ICD-10-CM

## 2014-06-10 NOTE — Telephone Encounter (Signed)
Faxed order to [facility] for Mammogram and Bone Density. [facility] to call patient and schedule appointments.

## 2014-06-25 ENCOUNTER — Other Ambulatory Visit: Payer: Self-pay | Admitting: *Deleted

## 2014-06-25 MED ORDER — FLUTICASONE PROPIONATE 50 MCG/ACT NA SUSP: 1.0000 | Freq: Two times a day (BID) | NASAL | Status: DC | PRN

## 2014-07-21 ENCOUNTER — Encounter: Payer: Self-pay | Admitting: Physician Assistant

## 2014-12-10 ENCOUNTER — Ambulatory Visit (INDEPENDENT_AMBULATORY_CARE_PROVIDER_SITE_OTHER): Admitting: Physician Assistant

## 2014-12-10 ENCOUNTER — Encounter: Payer: Self-pay | Admitting: Physician Assistant

## 2014-12-10 VITALS — BP 140/80 | HR 70 | Temp 97.5°F | Resp 16 | Ht 65.5 in | Wt 126.2 lb

## 2014-12-10 DIAGNOSIS — Z79899 Other long term (current) drug therapy: Secondary | ICD-10-CM

## 2014-12-10 DIAGNOSIS — J441 Chronic obstructive pulmonary disease with (acute) exacerbation: Secondary | ICD-10-CM

## 2014-12-10 DIAGNOSIS — K140 Glossitis: Secondary | ICD-10-CM

## 2014-12-10 MED ORDER — PREDNISONE 20 MG PO TABS: ORAL_TABLET | ORAL | Status: DC

## 2014-12-10 MED ORDER — LEVOFLOXACIN 500 MG PO TABS
500.0000 mg | ORAL_TABLET | Freq: Every day | ORAL | Status: DC
Start: 2014-12-10 — End: 2015-02-10

## 2014-12-10 MED ORDER — NYSTATIN 100000 UNIT/ML MT SUSP: 5.0000 mL | Freq: Four times a day (QID) | OROMUCOSAL | Status: DC

## 2014-12-10 MED ORDER — ALBUTEROL SULFATE HFA 108 (90 BASE) MCG/ACT IN AERS: 2.0000 | INHALATION_SPRAY | Freq: Four times a day (QID) | RESPIRATORY_TRACT | Status: DC | PRN

## 2014-12-10 NOTE — Patient Instructions (Signed)
Glossitis Glossitis is an inflammation of the tongue. Changes in the appearance of the tongue may be a primary tongue disorder. This means the problem is only in the tongue. Glossitis may be a symptom of other disorders. CAUSES   Excessive alcohol.  Multiple allergies.  Infections.  Tobacco and nicotine use.  Anemia.  Mechanical injury.  Spicy foods.  Vitamin B deficiency.  Damage from chemicals or hot food or drink. SYMPTOMS  There may be swelling and color changes in the tongue. Sometimes the surface of the tongue may look smooth. This disorder may be painless. But the tongue is usually sore and tender. It can be fiery red if condition is caused by deficiency of B vitamins. It is sometimes pale if there is anemia. Anemia means there are not enough red blood cells. There may be problems with chewing, swallowing and speaking. In some cases, glossitis may result in severe tongue swelling that blocks the airway. DIAGNOSIS  The diagnosis of glossitis is made easily by physical exam and asking for a history. Sometimes blood tests may be done. TREATMENT   The goal of treatment is to reduce inflammation. Hospitalization is usually not necessary unless tongue swelling is severe.  Good oral hygiene is important. This means good tooth brushing at least twice a day, and flossing daily for treatment and prevention.  Corticosteroids such as prednisone may be given to reduce the redness and soreness.  Medications may be prescribed if the cause of glossitis is an infection.  Other problems such as anemia and nutritional deficiencies are treated. This may be a dietary change or vitamin supplements. Avoid hot or spicy foods, alcohol, and tobacco. This lessens the discomfort.  Avoid anything that is irritating to your mouth or tongue. Glossitis usually responds well to treatment if the cause of it is removed or treated.  SEEK IMMEDIATE MEDICAL CARE IF:   Symptoms of glossitis persist for  longer than 10 days.  Tongue swelling is severe and breathing, speaking, chewing, or swallowing difficulties are present.  You have no relief from medications given.  You develop difficulties breathing. Document Released: 03/11/2002 Document Revised: 06/13/2011 Document Reviewed: 04/25/2008 [facility] Patient Information 2015 [place], [place]. This information is not intended to replace advice given to you by your health care provider. Make sure you discuss any questions you have with your health care provider. Chronic Obstructive Pulmonary Disease Chronic obstructive pulmonary disease (COPD) is a common lung problem. In COPD, the flow of air from the lungs is limited. The way your lungs work will probably never return to normal, but there are things you can do to improve your lungs and make yourself feel better. HOME CARE  Take all medicines as told by your doctor.  Avoid medicines or cough syrups that dry up your airway (such as antihistamines) and do not allow you to get rid of thick spit. You do not need to avoid them if told differently by your doctor.  If you smoke, stop. Smoking makes the problem worse.  Avoid being around things that make your breathing worse (like smoke, chemicals, and fumes).  Use oxygen therapy and therapy to help improve your lungs (pulmonary rehabilitation) if told by your doctor. If you need home oxygen therapy, ask your doctor if you should buy a tool to measure your oxygen level (oximeter).  Avoid people who have a sickness you can catch (contagious).  Avoid going outside when it is very hot, cold, or humid.  Eat healthy foods. Eat smaller meals more often.  Rest before meals.  Stay active, but remember to also rest.  Make sure to get all the shots (vaccines) your doctor recommends. Ask your doctor if you need a pneumonia shot.  Learn and use tips on how to relax.  Learn and use tips on how to control your breathing as told by your doctor.  Try:  Breathing in (inhaling) through your nose for 1 second. Then, pucker your lips and breath out (exhale) through your lips for 2 seconds.  Putting one hand on your belly (abdomen). Breathe in slowly through your nose for 1 second. Your hand on your belly should move out. Pucker your lips and breathe out slowly through your lips. Your hand on your belly should move in as you breathe out.  Learn and use controlled coughing to clear thick spit from your lungs. The steps are:  Lean your head a little forward.  Breathe in deeply.  Try to hold your breath for 3 seconds.  Keep your mouth slightly open while coughing 2 times.  Spit any thick spit out into a tissue.  Rest and do the steps again 1 or 2 times as needed. GET HELP IF:  You cough up more thick spit than usual.  There is a change in the color or thickness of the spit.  It is harder to breathe than usual.  Your breathing is faster than usual. GET HELP RIGHT AWAY IF:   You have shortness of breath while resting.  You have shortness of breath that stops you from:  Being able to talk.  Doing normal activities.  You chest hurts for longer than 5 minutes.  Your skin color is more blue than usual.  Your pulse oximeter shows that you have low oxygen for longer than 5 minutes. MAKE SURE YOU:   Understand these instructions.  Will watch your condition.  Will get help right away if you are not doing well or get worse. Document Released: 09/07/2007 Document Revised: 08/05/2013 Document Reviewed: 11/15/2012 [facility] Patient Information 2015 [place], [place]. This information is not intended to replace advice given to you by your health care provider. Make sure you discuss any questions you have with your health care provider.

## 2014-12-10 NOTE — Progress Notes (Signed)
   Subjective:    Patient ID: [name], female    DOB: 1955-04-06, 59 y.o.   MRN: [id number]  HPI 59 y.o. WF with history of COPD (72 year pack smoking history, q 2 years ago), HTN, presents with bronchitis symptoms x 2 weeks. She has sinus pressure, upper back pain, cough with green mucus, fever, chills, SOB, wheezing.  She is on mucinex, advair/incruse, flonase, and has albuterol inhaler but has not used it. Has seen Dr. Redmond Baseman in the past, ENT. She also has tongue tenderness for a long time, feels swollen/tender.   Blood pressure 140/80, pulse 70, temperature 97.5 F (36.4 C), resp. rate 16, height 5' 5.5" (1.664 m), weight 126 lb 3.2 oz (57.244 kg). Current Outpatient Prescriptions on File Prior to Visit  Medication Sig Dispense Refill  . buPROPion (WELLBUTRIN XL) 300 MG 24 hr tablet TAKE 1 TABLET DAILY 90 tablet 4  . calcium carbonate (OS-CAL) 600 MG TABS tablet Take 600 mg by mouth daily with breakfast.    . fluticasone (FLONASE) 50 MCG/ACT nasal spray Place 1 spray into both nostrils 2 (two) times daily as needed for allergies or rhinitis. 16 g 3  . Fluticasone-Salmeterol (ADVAIR DISKUS) 250-50 MCG/DOSE AEPB Inhale 1 puff into the lungs 2 (two) times daily. 180 each 1  . guaiFENesin (MUCINEX) 600 MG 12 hr tablet Take by mouth daily.    Marland Kitchen ibuprofen (ADVIL) 200 MG tablet Take 400 mg by mouth every 8 (eight) hours as needed for fever.    . Multiple Vitamin (MULTIVITAMIN WITH MINERALS) TABS Take 1 tablet by mouth every morning.    . vitamin C (ASCORBIC ACID) 500 MG tablet Take 500 mg by mouth every morning.     No current facility-administered medications on file prior to visit.   Past Medical History  Diagnosis Date  . Depression   . Anxiety   . COPD (chronic obstructive pulmonary disease)   . Hyperlipidemia   . Labile hypertension     Review of Systems  Constitutional: Positive for fatigue. Negative for fever and chills.  HENT: Positive for congestion, rhinorrhea, sinus pressure  and sore throat. Negative for dental problem, ear discharge, ear pain, nosebleeds, trouble swallowing and voice change.   Respiratory: Positive for cough, chest tightness, shortness of breath and wheezing.   Cardiovascular: Negative.   Gastrointestinal: Negative.   Genitourinary: Negative.   Musculoskeletal: Negative.   Neurological: Negative.        Objective:   Physical Exam  Constitutional: She is oriented to person, place, and time. She appears well-developed and well-nourished.  HENT:  Head: Normocephalic and atraumatic.  Right Ear: External ear normal.  Left Ear: External ear normal.  Nose: Nose normal.  Mouth/Throat: Oropharynx is clear and moist.  Eyes: Conjunctivae are normal. Pupils are equal, round, and reactive to light.  Neck: Normal range of motion. Neck supple.  Cardiovascular: Normal rate and regular rhythm.   Pulmonary/Chest: Effort normal. No respiratory distress. She has wheezes (diffuse but worse right lower lobe). She has no rales. She exhibits no tenderness.  Abdominal: Soft. Bowel sounds are normal.  Lymphadenopathy:    She has no cervical adenopathy.  Neurological: She is alert and oriented to person, place, and time.  Skin: Skin is warm and dry.      Assessment & Plan:  AECOPD- Levaquin '500mg'$ , prednisone, albuterol, and continue inhalers.  If worsening SOB, CP go to ER Needs CPE  Glossitis- check labs, will give nystatin mouth wash due to inhaler use.

## 2014-12-11 LAB — BASIC METABOLIC PANEL WITH GFR
BUN: 8 mg/dL (ref 7–25)
CALCIUM: 9.1 mg/dL (ref 8.6–10.4)
CO2: 29 mmol/L (ref 20–31)
CREATININE: 0.59 mg/dL (ref 0.50–0.99)
Chloride: 104 mmol/L (ref 98–110)
GFR, Est Non African American: >89 mL/min (ref >=60)
Glucose, Bld: 97 mg/dL (ref 65–99)
Potassium: 3.9 mmol/L (ref 3.5–5.3)
SODIUM: 145 mmol/L (ref 135–146)

## 2014-12-11 LAB — CBC WITH DIFFERENTIAL/PLATELET
Basophils Absolute: 0 10*3/uL (ref 0.0–0.1)
Basophils Relative: 0 % (ref 0–1)
EOS ABS: 0.1 10*3/uL (ref 0.0–0.7)
EOS PCT: 1 % (ref 0–5)
HCT: 41.8 % (ref 36.0–46.0)
Hemoglobin: 13.7 g/dL (ref 12.0–15.0)
LYMPHS ABS: 2.1 10*3/uL (ref 0.7–4.0)
Lymphocytes Relative: 22 % (ref 12–46)
MCH: 30.2 pg (ref 26.0–34.0)
MCHC: 32.8 g/dL (ref 30.0–36.0)
MCV: 92.1 fL (ref 78.0–100.0)
MONO ABS: 0.5 10*3/uL (ref 0.1–1.0)
MONOS PCT: 5 % (ref 3–12)
MPV: 10.9 fL (ref 8.6–12.4)
Neutro Abs: 6.9 10*3/uL (ref 1.7–7.7)
Neutrophils Relative %: 72 % (ref 43–77)
PLATELETS: 201 10*3/uL (ref 150–400)
RBC: 4.54 MIL/uL (ref 3.87–5.11)
RDW: 13.7 % (ref 11.5–15.5)
WBC: 9.6 10*3/uL (ref 4.0–10.5)

## 2014-12-11 LAB — IRON AND TIBC
%SAT: 26 % (ref 11–50)
Iron: 60 ug/dL (ref 45–160)
TIBC: 229 ug/dL — ABNORMAL LOW (ref 250–450)
UIBC: 169 ug/dL (ref 125–400)

## 2014-12-11 LAB — HEPATIC FUNCTION PANEL
ALBUMIN: 3.9 g/dL (ref 3.6–5.1)
ALT: 18 U/L (ref 6–29)
AST: 18 U/L (ref 10–35)
Alkaline Phosphatase: 87 U/L (ref 33–130)
BILIRUBIN DIRECT: 0.1 mg/dL (ref <=0.2)
BILIRUBIN TOTAL: 0.3 mg/dL (ref 0.2–1.2)
Indirect Bilirubin: 0.2 mg/dL (ref 0.2–1.2)
Total Protein: 6.3 g/dL (ref 6.1–8.1)

## 2014-12-11 LAB — FOLATE RBC: RBC Folate: 593 ng/mL (ref >280)

## 2014-12-11 LAB — FERRITIN: FERRITIN: 128 ng/mL (ref 10–291)

## 2014-12-11 LAB — VITAMIN B12: Vitamin B-12: 1103 pg/mL — ABNORMAL HIGH (ref 211–911)

## 2014-12-16 ENCOUNTER — Other Ambulatory Visit: Payer: Self-pay | Admitting: Internal Medicine

## 2014-12-29 ENCOUNTER — Ambulatory Visit (INDEPENDENT_AMBULATORY_CARE_PROVIDER_SITE_OTHER): Admitting: Physician Assistant

## 2014-12-29 ENCOUNTER — Encounter: Payer: Self-pay | Admitting: Physician Assistant

## 2014-12-29 VITALS — BP 116/78 | HR 72 | Temp 97.5°F | Resp 16 | Ht 65.5 in | Wt 127.0 lb

## 2014-12-29 DIAGNOSIS — Z79899 Other long term (current) drug therapy: Secondary | ICD-10-CM

## 2014-12-29 DIAGNOSIS — F329 Major depressive disorder, single episode, unspecified: Secondary | ICD-10-CM

## 2014-12-29 DIAGNOSIS — E785 Hyperlipidemia, unspecified: Secondary | ICD-10-CM

## 2014-12-29 DIAGNOSIS — E559 Vitamin D deficiency, unspecified: Secondary | ICD-10-CM

## 2014-12-29 DIAGNOSIS — R0989 Other specified symptoms and signs involving the circulatory and respiratory systems: Secondary | ICD-10-CM

## 2014-12-29 DIAGNOSIS — Z0000 Encounter for general adult medical examination without abnormal findings: Secondary | ICD-10-CM | POA: Diagnosis not present

## 2014-12-29 DIAGNOSIS — J449 Chronic obstructive pulmonary disease, unspecified: Secondary | ICD-10-CM

## 2014-12-29 DIAGNOSIS — F32A Depression, unspecified: Secondary | ICD-10-CM

## 2014-12-29 DIAGNOSIS — I10 Essential (primary) hypertension: Secondary | ICD-10-CM

## 2014-12-29 DIAGNOSIS — F419 Anxiety disorder, unspecified: Secondary | ICD-10-CM

## 2014-12-29 LAB — LIPID PANEL
Cholesterol: 185 mg/dL (ref 125–200)
HDL: 68 mg/dL (ref >=46)
LDL CALC: 86 mg/dL (ref <130)
TRIGLYCERIDES: 157 mg/dL — AB (ref <150)
Total CHOL/HDL Ratio: 2.7 Ratio (ref <=5.0)
VLDL: 31 mg/dL — AB (ref <30)

## 2014-12-29 LAB — MAGNESIUM: MAGNESIUM: 2.1 mg/dL (ref 1.5–2.5)

## 2014-12-29 NOTE — Progress Notes (Signed)
Complete Physical   Assessment and Plan: 1. Labile hypertension - continue medications, DASH diet, exercise and monitor at home. Call if greater than 130/80.  - TSH - Urinalysis, Routine w reflex microscopic (not at [facility]) - Microalbumin / creatinine urine ratio - EKG 12-Lead - [place], RETROPERITNL ABD,  LTD  2. Chronic obstructive pulmonary disease, unspecified COPD, unspecified chronic bronchitis type  will get CXR, continue meds.   3. Hyperlipidemia -continue medications, check lipids, decrease fatty foods, increase activity.  - Lipid panel  4. Depression Depression- continue medications, stress management techniques discussed, increase water, good sleep hygiene discussed, increase exercise, and increase veggies.   5. Anxiety Anxiety-  continue medications, stress management techniques discussed, increase water, good sleep hygiene discussed, increase exercise, and increase veggies.   6. Medication management - Magnesium  7. Vitamin D deficiency - Vit D  25 hydroxy (rtn osteoporosis monitoring)   Discussed med's effects and SE's. Screening labs and tests as requested with regular follow-up as recommended.   HPI 59 y.o. female  presents for a complete physical. 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 She denies chest pain, dizziness.  Lab Results  Component Value Date   HGBA1C 5.0 07/17/2013   She is not on cholesterol medication. Her cholesterol is at goal. The cholesterol last visit was:   Lab Results  Component Value Date   CHOL 239* 07/17/2013   HDL 82 07/17/2013   LDLCALC 145* 07/17/2013   TRIG 59 07/17/2013   CHOLHDL 2.9 07/17/2013   Patient is on Vitamin D supplement.   Lab Results  Component Value Date   VD25OH 48 07/17/2013   She denies swelling at the end of her shift in her feet however she states that she has back and leg pain in the  morning with some stiffness. Takes Advil occ helps. She has COPD, last CXR was 07/2012. She has She has stopped smoking since Aug 2nd 2014. Treated for COPD exacerbation 09/07, feeling better, still with cough  Current Medications:  Current Outpatient Prescriptions on File Prior to Visit  Medication Sig Dispense Refill  . ADVAIR DISKUS 250-50 MCG/DOSE AEPB USE 1 INHALATION TWICE A DAY 180 each 1  . albuterol (PROAIR HFA) 108 (90 BASE) MCG/ACT inhaler Inhale 2 puffs into the lungs every 6 (six) hours as needed for wheezing or shortness of breath. 18 g 3  . buPROPion (WELLBUTRIN XL) 300 MG 24 hr tablet TAKE 1 TABLET DAILY 90 tablet 4  . calcium carbonate (OS-CAL) 600 MG TABS tablet Take 600 mg by mouth daily with breakfast.    . fluticasone (FLONASE) 50 MCG/ACT nasal spray Place 1 spray into both nostrils 2 (two) times daily as needed for allergies or rhinitis. 16 g 3  . guaiFENesin (MUCINEX) 600 MG 12 hr tablet Take by mouth daily.    Marland Kitchen ibuprofen (ADVIL) 200 MG tablet Take 400 mg by mouth every 8 (eight) hours as needed for fever.    Marland Kitchen levofloxacin (LEVAQUIN) 500 MG tablet Take 1 tablet (500 mg total) by mouth daily. 10 tablet 0  . Multiple Vitamin (MULTIVITAMIN WITH MINERALS) TABS Take 1 tablet by mouth every morning.    . nystatin (MYCOSTATIN) 100000 UNIT/ML suspension Take 5 mLs (500,000 Units total) by mouth 4 (four) times daily. Swish and swallow 240 mL 1  . predniSONE (DELTASONE) 20 MG tablet 2 tablets daily  for 3 days, 1 tablet daily for 4 days. 10 tablet 0  . vitamin C (ASCORBIC ACID) 500 MG tablet Take 500 mg by mouth every morning.     No current facility-administered medications on file prior to visit.   Health Maintenance:   Tetanus:declines Pneumovax: declines Prevnar 13: declines Flu vaccine: declines Zostavax: declines Pap: 2015,  1 abnormal 40 years ago, due today MGM: 10/2014 DEXA: Higher risk due to tobacco history and BMI, no fractures, family history will wait.   Colonoscopy: 2015 EGD: N/A  Allergies: No Known Allergies Medical History:  Past Medical History  Diagnosis Date  . Depression   . Anxiety   . COPD (chronic obstructive pulmonary disease)   . Hyperlipidemia   . Labile hypertension    Surgical History:  Past Surgical History  Procedure Laterality Date  . Nasal sinus surgery  1986   Family History:  Family History  Problem Relation Age of Onset  . Heart attack Mother   . Emphysema Mother   . Heart attack Father   . Asthma Other   . Colon cancer Neg Hx    Social History:  Social History  Substance Use Topics  . Smoking status: Former Smoker -- 0.50 packs/day  . Smokeless tobacco: Current User    Last Attempt to Quit: 11/02/2012     Comment: vapor  . Alcohol Use: 25.2 oz/week    42 Cans of beer per week   ROS    Physical Exam: Estimated body mass index is 20.81 kg/(m^2) as calculated from the following:   Height as of this encounter: 5' 5.5" (1.664 m).   Weight as of this encounter: 127 lb (57.607 kg). BP 116/78 mmHg  Pulse 72  Temp(Src) 97.5 F (36.4 C)  Resp 16  Ht 5' 5.5" (1.664 m)  Wt 127 lb (57.607 kg)  BMI 20.81 kg/m2 General Appearance: Well nourished, in no apparent distress. Eyes: PERRLA, EOMs, conjunctiva no swelling or erythema, normal fundi and vessels. Sinuses: No Frontal/maxillary tenderness ENT/Mouth: Ext aud canals clear, normal light reflex with TMs without erythema, bulging.  Good dentition. No erythema, swelling, or exudate on post pharynx. Tonsils not swollen or erythematous. Hearing normal.  Neck: Supple, thyroid normal. No bruits Respiratory: Respiratory effort normal, , wheezing left sided, BS equal bilaterally without rales, rhonchi, or stridor. Cardio: RRR without murmurs, rubs or gallops. Brisk peripheral pulses without edema.  Chest: symmetric, with normal excursions and percussion. Breasts: Symmetric, without lumps, nipple discharge, retractions. Abdomen: Soft, +BS. Non tender,  no guarding, rebound, hernias, masses, or organomegaly.  Lymphatics: Non tender without lymphadenopathy.  Musculoskeletal: Full ROM all peripheral extremities,5/5 strength, and normal gait. Skin: Warm, dry without rashes, lesions, ecchymosis.  Neuro: Cranial nerves intact, reflexes equal bilaterally. Normal muscle tone, no cerebellar symptoms. Sensation intact.  Psych: Awake and oriented X 3, normal affect, Insight and Judgment appropriate.   EKG: WNL no changes. Aorta Scan: WNL  [name] 3:16 PM

## 2014-12-30 LAB — URINALYSIS, MICROSCOPIC ONLY
Bacteria, UA: NONE SEEN [HPF]
CRYSTALS: NONE SEEN [HPF]
Casts: NONE SEEN [LPF]
RBC / HPF: NONE SEEN RBC/HPF (ref <=2)
Squamous Epithelial / LPF: NONE SEEN [HPF] (ref <=5)
WBC, UA: NONE SEEN WBC/HPF (ref <=5)
YEAST: NONE SEEN [HPF]

## 2014-12-30 LAB — URINALYSIS, ROUTINE W REFLEX MICROSCOPIC
BILIRUBIN URINE: NEGATIVE
Glucose, UA: NEGATIVE
HGB URINE DIPSTICK: NEGATIVE
KETONES UR: NEGATIVE
NITRITE: NEGATIVE
PH: 6.5 (ref 5.0–8.0)
Protein, ur: NEGATIVE
SPECIFIC GRAVITY, URINE: 1.011 (ref 1.001–1.035)

## 2014-12-30 LAB — MICROALBUMIN / CREATININE URINE RATIO
Creatinine, Urine: 64.4 mg/dL
MICROALB UR: 0.2 mg/dL (ref <2.0)
Microalb Creat Ratio: 3.1 mg/g (ref 0.0–30.0)

## 2014-12-30 LAB — VITAMIN D 25 HYDROXY (VIT D DEFICIENCY, FRACTURES): Vit D, 25-Hydroxy: 50 ng/mL (ref 30–100)

## 2014-12-30 LAB — TSH: TSH: 3.819 u[IU]/mL (ref 0.350–4.500)

## 2015-01-10 ENCOUNTER — Encounter: Payer: Self-pay | Admitting: *Deleted

## 2015-01-26 ENCOUNTER — Other Ambulatory Visit: Payer: Self-pay | Admitting: Physician Assistant

## 2015-01-26 ENCOUNTER — Telehealth: Payer: Self-pay | Admitting: Physician Assistant

## 2015-01-26 DIAGNOSIS — J449 Chronic obstructive pulmonary disease, unspecified: Secondary | ICD-10-CM

## 2015-01-26 DIAGNOSIS — R9389 Abnormal findings on diagnostic imaging of other specified body structures: Secondary | ICD-10-CM

## 2015-01-26 NOTE — Telephone Encounter (Signed)
Patient went to UC over the weekend for cough, had CXR and states there was a mass. Referral made for CT and pulmonary but to [place] and she is requesting to have those things done here. Will get records sent to our office.

## 2015-01-30 ENCOUNTER — Other Ambulatory Visit

## 2015-02-10 ENCOUNTER — Ambulatory Visit (INDEPENDENT_AMBULATORY_CARE_PROVIDER_SITE_OTHER): Admitting: Pulmonary Disease

## 2015-02-10 ENCOUNTER — Encounter: Payer: Self-pay | Admitting: Pulmonary Disease

## 2015-02-10 VITALS — BP 122/80 | HR 75 | Temp 98.1°F | Ht 64.0 in | Wt 124.6 lb

## 2015-02-10 DIAGNOSIS — J432 Centrilobular emphysema: Secondary | ICD-10-CM

## 2015-02-10 DIAGNOSIS — J189 Pneumonia, unspecified organism: Secondary | ICD-10-CM

## 2015-02-10 DIAGNOSIS — R938 Abnormal findings on diagnostic imaging of other specified body structures: Secondary | ICD-10-CM

## 2015-02-10 DIAGNOSIS — R0600 Dyspnea, unspecified: Secondary | ICD-10-CM | POA: Diagnosis not present

## 2015-02-10 DIAGNOSIS — R9389 Abnormal findings on diagnostic imaging of other specified body structures: Secondary | ICD-10-CM

## 2015-02-10 MED ORDER — AMOXICILLIN-POT CLAVULANATE 875-125 MG PO TABS: 1.0000 | ORAL_TABLET | Freq: Two times a day (BID) | ORAL | Status: DC

## 2015-02-10 MED ORDER — FLUTICASONE-SALMETEROL 250-50 MCG/DOSE IN AEPB: INHALATION_SPRAY | RESPIRATORY_TRACT | Status: DC

## 2015-02-10 MED ORDER — UMECLIDINIUM BROMIDE 62.5 MCG/INH IN AEPB: 1.0000 | INHALATION_SPRAY | Freq: Every day | RESPIRATORY_TRACT | Status: DC

## 2015-02-10 NOTE — Patient Instructions (Signed)
Tylena-- it was nice meeting you today...  We reviewed your available medical data, did a preliminary breathing test, and checked your oxygen levels...     We decided to add an additional antibiotic and wait a few weeks to repeat your CXR & CT scan...     In this regard- start the new AUGMENTIN '875mg'$  one tab twice daily til gone...    Be sure to always take a Probiotic like ALIGN (OTC) daily while you are on the antibiotics...  For your COPD I would like to recommend taking the OIBBCW888- one inhalation twice daily...    And add the INCRUSE one inhalation once daily back into your regimen...  Continue the [facility] '600mg'$  tabs taking 2 tabs twice daily w/ lots of fluids...  You may use the OTC DELSYM for cough + lozenges, gum, copugh drops, etc...  Call for any questions...  Let's plan a follow up visit in 3 weeks, sooner if needed for problems.Marland KitchenMarland Kitchen

## 2015-02-28 ENCOUNTER — Encounter: Payer: Self-pay | Admitting: Pulmonary Disease

## 2015-02-28 NOTE — Progress Notes (Signed)
Subjective:     Patient ID: Katrina Campbell, female   DOB: Sep 10, 1955, 59 y.o.   MRN: [id number]  HPI 59 y/o WF, referred by [name] & [name] for an abn CXR>      Katrina Campbell is an ex-smoker, having started in her teens and smoked for ~45 yrs up to 2ppd, for an est 24 pack-yr smoking hx (she quit in 2014 because her fingers were purple);  She carries the Dx of underlying COPD & was placed on Advair250, also tried Incruse x 10mobut she stopped this med;  She describes a developing cough, prod of a small amt of green phlegm (no blood), assoc w/ increased SOB and sharp left upper CP, and decr energy "I was past going";  She went to urgent care where a CXR showed a left upper lobe opac "I was treated for pneumonia w/ 2 shots & Levaquin x 10d";  [facility] showing   Smoking Hx>  As above  Pulmonary Hx>  She has a hx of sinusitis w/ surg x2;  She has had occas bouts of bronchitis requiring antibiotics & pneumonia in 2015 treated w/ antibiotic & pred w/ resolution;  No hx asthma, no hx TB or known exposure;  She admits to mild cough, some chest congestion, but denied SOB/DOE before the last 2-3 yrs...  Medical Hx>  HBP (but not requiring meds now), HL (on diet alone), anxiety/ depression (on WellbutrinXL)..Marland KitchenMarland Kitchen Family Hx>  Father was a smoker w/ emphysema & died in his 773'sw/ CHF; mother died at 749w/ CHF; all 3 sibs have lung dis- 1Bro died 564'dw/ lung cancer, 1Sis died w/ pneumonia, 1Sis alive w/ COPD...  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  Current Meds>  Advair250, AlbuterolHFA, Mucinex, ASA81, MVIs, WellbutrinXL300...  EXAM shows Afeb, VSS, O2sat=98% on RA at rest;  HEENT- neg, mallampati1;  Chest- decr BS at bases, w/o w/r/r;  Heart- RR w/o m/r/g;  Abd- soft, neg;  Ext- w/o c/c/e;  Neuro- intact...   CXR 07/19/12 showed norm heart size, COPD w/  hyperinflation, resolved prev lingular infiltrate- now clear/ NAD...   [facility] athersclerotic calcif in Ao & branch vessels including the coronaries, no adenopathy, COPD changes noted, no pulm nodules, min RML atx  LABS 9/16 in Epic>  Chems- wnl;  CBC- wnl;  Norm VitD, B12, & Iron;  TSH=3.82...  [facility], underlying COPD w/ hyperinflation, 2.6cm density LUL medially- infiltrate vs mass...  CT Chest performed 01/27/15 [facility] showed left apical (medial/post) opacity assoc w/ centrilob emphysema changes & scattered areas of scarring  Spirometry 02/10/15 showed FVC=2.98 (97%), FEV1=1.59 (66%), %1sec=54%, mid-flows reduced at 33% predicted... c/w mod severe airflow obstruction & GOLD Stage2 COPD.  Ambulatory Oxygen saturation test on RA 02/10/15>  O2sat=97% w/ pulse=62/min;  She ambulated 3 laps w/ lowest O2sat=84% w/ pulse=102/min (SOB during a cough paroxysm)...   IMP/PLAN>>  CYeslinhas mod COPD & quit smoking 2014;  She had a lingular pneumonia in 2015 which resolved to baseline after antibiotics and pred rx;  She presented w/ acute LUL symptoms c/w pneumonia and an abn CXR=> slow to resolve after antibiotic rx;  I told her that in my opinion we should give it a little longer to see if  this represents XRay lag time- rec to take AUGMENTIN875Bid/Align, continue MUCINEX(2Bid)/ fluids, continue ADVAIR250Bid, and restart the INCRUSE daily;  We plan ROV in 3wks w/ CXR & to consider timing of a repeat CT, consider PET, etc....     Past Medical History  Diagnosis Date  . Depression >> on WellbutrinXL300   . Anxiety   . COPD (chronic obstructive pulmonary disease) ([place])   . Hyperlipidemia   . Labile hypertension >> not current on BP meds      Past Surgical History  Procedure Laterality Date  . Nasal sinus surgery  1986    Outpatient Encounter Prescriptions as of 02/10/2015  Medication  Sig  . albuterol (PROAIR HFA) 108 (90 BASE) MCG/ACT inhaler Inhale 2 puffs into the lungs every 6 (six) hours as needed for wheezing or shortness of breath.  Marland Kitchen aspirin 81 MG tablet Take 81 mg by mouth daily.  Marland Kitchen buPROPion (WELLBUTRIN XL) 300 MG 24 hr tablet TAKE 1 TABLET DAILY  . calcium carbonate (OS-CAL) 600 MG TABS tablet Take 600 mg by mouth daily with breakfast.  . fluticasone (FLONASE) 50 MCG/ACT nasal spray Place 1 spray into both nostrils 2 (two) times daily as needed for allergies or rhinitis.  . Fluticasone-Salmeterol (ADVAIR DISKUS) 250-50 MCG/DOSE AEPB USE 1 INHALATION TWICE A DAY  . guaiFENesin (MUCINEX) 600 MG 12 hr tablet Take by mouth daily.  Marland Kitchen ibuprofen (ADVIL) 200 MG tablet Take 400 mg by mouth every 8 (eight) hours as needed for fever.  . Multiple Vitamin (MULTIVITAMIN WITH MINERALS) TABS Take 1 tablet by mouth every morning.  . vitamin C (ASCORBIC ACID) 500 MG tablet Take 500 mg by mouth every morning.  . [DISCONTINUED] ADVAIR DISKUS 250-50 MCG/DOSE AEPB USE 1 INHALATION TWICE A DAY  . amoxicillin-clavulanate (AUGMENTIN) 875-125 MG tablet Take 1 tablet by mouth 2 (two) times daily.  . [DISCONTINUED] levofloxacin (LEVAQUIN) 500 MG tablet Take 1 tablet (500 mg total) by mouth daily.  . [DISCONTINUED] nystatin (MYCOSTATIN) 100000 UNIT/ML suspension Take 5 mLs (500,000 Units total) by mouth 4 (four) times daily. Swish and swallow (Patient not taking: Reported on 02/10/2015)  . [DISCONTINUED] predniSONE (DELTASONE) 20 MG tablet 2 tablets daily for 3 days, 1 tablet daily for 4 days.    No Known Allergies   Family History  Problem Relation Age of Onset  . Heart attack Mother   . Emphysema Mother   . Heart attack Father   . Asthma Other   . Colon cancer Neg Hx     Social History   Social History  . Marital Status: Married    Spouse Name: N/A  . Number of Children: N/A  . Years of Education: N/A   Occupational History  . Not on file.   Social History Main Topics  .  Smoking status: Former Smoker -- 1.00 packs/day for 40 years    Types: Cigarettes    Quit date: 11/09/2012  . Smokeless tobacco: Current User    Last Attempt to Quit: 11/02/2012     Comment: vapor  . Alcohol Use: 25.2 oz/week    42 Cans of beer per week  . Drug Use: No  . Sexual Activity: Not on file   Other Topics Concern  . Not on file   Social History Narrative    Current Medications, Allergies, Past Medical History, Past Surgical History, Family History, and Social History were reviewed in [employer] record.   Review of Systems  All symptoms NEG except where BOLDED >>  Constitutional:  F/C/S, fatigue, anorexia, unexpected weight change. HEENT:  HA, visual changes, hearing loss, earache, nasal symptoms, sore throat, mouth sores, hoarseness. Resp:  cough, sputum, hemoptysis; SOB, tightness, wheezing. Cardio:  CP, palpit, DOE, orthopnea, edema. GI:  N/V/D/C, blood in stool; reflux, abd pain, distention, gas. GU:  dysuria, freq, urgency, hematuria, flank pain, voiding difficulty. MS:  joint pain, swelling, tenderness, decr ROM; neck pain, back pain, etc. Neuro:  HA, tremors, seizures, dizziness, syncope, weakness, numbness, gait abn. Skin:  suspicious lesions or skin rash. Heme:  adenopathy, bruising, bleeding. Psyche:  confusion, agitation, sleep disturbance, hallucinations, anxiety, depression suicidal.   Objective:   Physical Exam       Vital Signs:  Reviewed...  General:  WD, WN, 59 y/o WF in NAD; alert & oriented; pleasant & cooperative... HEENT:  [place]/AT; Conjunctiva- pink, Sclera- nonicteric, EOM-wnl, PERRLA, EACs-clear, TMs-wnl; NOSE-clear; THROAT-clear & wnl. Neck:  Supple w/ fair ROM; no JVD; normal carotid impulses w/o bruits; no thyromegaly or nodules palpated; no lymphadenopathy. Chest:  Decr BS at bases, clear to P & A x few scat rhonchi- without wheezes, rales, or signs of consolidation. Heart:  Regular Rhythm; norm S1 & S2  without murmurs, rubs, or gallops detected. Abdomen:  Soft & nontender- no guarding or rebound; normal bowel sounds; no organomegaly or masses palpated. Ext:  Normal ROM; without deformities or arthritic changes; no varicose veins, venous insuffic, or edema;  Pulses intact w/o bruits. Neuro:  No focal neuro deficits; sensory testing normal; gait normal & balance OK. Derm:  No lesions noted; no rash etc. Lymph:  No cervical, supraclavicular, axillary, or inguinal adenopathy palpated.   Assessment:      IMP >>     COPD>  Mod airlow obstruction w/ GOLD Stage2 COPD; on Advair250, restart Incruse daily + Mucinex, Proair, etc...    Abn CXR/ CTChest>  LUL opacity- r/o pneumonia/ tumor/ etc...    HxPneumonia>  He had lingular pneumonia 2015 that responded to antibiotics and pred...    Ex-smoker>  She has ~65 pack-yr hx and quit in 2014...    Atherosclerotic calcif in Ao, branch vessels, and coronaries on prev CTs...   PLAN >>     Katrina Campbell has mod COPD & quit smoking 2014;  She had a lingular pneumonia in 2015 which resolved to baseline after antibiotics and pred rx;  She presented w/ acute LUL symptoms c/w pneumonia and an abn CXR=> slow to resolve after antibiotic rx;  I told her that in my opinion we should give it a little longer to see if this represents XRay lag time- rec to take AUGMENTIN875Bid/Align, continue MUCINEX(2Bid)/ fluids, continue ADVAIR250Bid, and restart the INCRUSE daily;  We plan ROV in 3wks w/ CXR & to consider timing of a repeat CT, consider PET, etc...     Plan:     Patient's Medications  New Prescriptions   AMOXICILLIN-CLAVULANATE (AUGMENTIN) 875-125 MG TABLET    Take 1 tablet by mouth 2 (two) times daily.   UMECLIDINIUM BROMIDE (INCRUSE ELLIPTA) 62.5 MCG/INH AEPB    Inhale 1 puff into the lungs daily.  Previous Medications   ALBUTEROL (PROAIR HFA) 108 (90 BASE) MCG/ACT INHALER    Inhale 2 puffs into the lungs every 6 (six) hours as needed for wheezing or shortness of  breath.   ASPIRIN 81 MG TABLET    Take 81 mg by mouth daily.   BUPROPION (WELLBUTRIN XL) 300 MG 24 HR TABLET  TAKE 1 TABLET DAILY   CALCIUM CARBONATE (OS-CAL) 600 MG TABS TABLET    Take 600 mg by mouth daily with breakfast.   FLUTICASONE (FLONASE) 50 MCG/ACT NASAL SPRAY    Place 1 spray into both nostrils 2 (two) times daily as needed for allergies or rhinitis.   GUAIFENESIN (MUCINEX) 600 MG 12 HR TABLET    Take by mouth daily.   IBUPROFEN (ADVIL) 200 MG TABLET    Take 400 mg by mouth every 8 (eight) hours as needed for fever.   MULTIPLE VITAMIN (MULTIVITAMIN WITH MINERALS) TABS    Take 1 tablet by mouth every morning.   VITAMIN C (ASCORBIC ACID) 500 MG TABLET    Take 500 mg by mouth every morning.  Modified Medications   Modified Medication Previous Medication   FLUTICASONE-SALMETEROL (ADVAIR DISKUS) 250-50 MCG/DOSE AEPB ADVAIR DISKUS 250-50 MCG/DOSE AEPB      USE 1 INHALATION TWICE A DAY    USE 1 INHALATION TWICE A DAY  Discontinued Medications   LEVOFLOXACIN (LEVAQUIN) 500 MG TABLET    Take 1 tablet (500 mg total) by mouth daily.   NYSTATIN (MYCOSTATIN) 100000 UNIT/ML SUSPENSION    Take 5 mLs (500,000 Units total) by mouth 4 (four) times daily. Swish and swallow   PREDNISONE (DELTASONE) 20 MG TABLET    2 tablets daily for 3 days, 1 tablet daily for 4 days.

## 2015-03-09 ENCOUNTER — Other Ambulatory Visit: Payer: Self-pay | Admitting: Internal Medicine

## 2015-03-11 ENCOUNTER — Ambulatory Visit (INDEPENDENT_AMBULATORY_CARE_PROVIDER_SITE_OTHER): Admitting: Pulmonary Disease

## 2015-03-11 ENCOUNTER — Ambulatory Visit (INDEPENDENT_AMBULATORY_CARE_PROVIDER_SITE_OTHER)
Admission: RE | Admit: 2015-03-11 | Discharge: 2015-03-11 | Disposition: A | Discharge status: Home / Self Care | Source: Ambulatory Visit | Attending: Pulmonary Disease | Admitting: Pulmonary Disease

## 2015-03-11 ENCOUNTER — Encounter: Payer: Self-pay | Admitting: Pulmonary Disease

## 2015-03-11 VITALS — BP 130/70 | HR 57 | Temp 98.3°F | Wt 129.2 lb

## 2015-03-11 DIAGNOSIS — R9389 Abnormal findings on diagnostic imaging of other specified body structures: Secondary | ICD-10-CM

## 2015-03-11 DIAGNOSIS — J189 Pneumonia, unspecified organism: Secondary | ICD-10-CM

## 2015-03-11 DIAGNOSIS — R938 Abnormal findings on diagnostic imaging of other specified body structures: Secondary | ICD-10-CM

## 2015-03-11 DIAGNOSIS — J449 Chronic obstructive pulmonary disease, unspecified: Secondary | ICD-10-CM

## 2015-03-11 MED ORDER — METHYLPREDNISOLONE 8 MG PO TABS: ORAL_TABLET | ORAL | Status: DC

## 2015-03-11 NOTE — Patient Instructions (Signed)
Today we updated your med list in our EPIC system...    Continue your current medications the same...    Continue the ADVAIR twice daily & the INCRUSE daily...  Increase the MUCINEX (Guaifenesin) to '600mg'$  4 times daily (or '1200mg'$  twice daily)...    With lots of extra fluids....  We are prescribing MEDROL '8mg'$  tabs to take as follows>>    Start w/ one tab twice daily for 5 days...    Then decrease to one tab each AM for 5 days...    Then decrease to 1/2 tab each AM for 5 days...    Then decrease to 1/2 tab every other day til gone (1/2, 0, 1/2, 0, etc)...  Work hard to expectorate the phlegm...  Call for any questions or if the sputum changes or you develop fever/ chills/ or sweats...  Let's plan a follow up visit in 2month, sooner if needed for problems..Marland KitchenMarland Kitchen

## 2015-03-11 NOTE — Progress Notes (Signed)
Subjective:     Patient ID: Katrina Campbell, female   DOB: Aug 02, 1955, 59 y.o.   MRN: [id number]  HPI ~  February 10, 2015:  Initial pulmonary consult by SN>  54 y/o WF, referred by Katrina Campbell & Katrina Campbell for an abn CXR>      Katrina Campbell is an ex-smoker, having started in her teens and smoked for ~45 yrs up to 2ppd, for an est 51 pack-yr smoking hx (she quit in 2014 because her fingers were purple);  She carries the Dx of underlying COPD & was placed on Advair250, also tried Incruse x 64mobut she stopped this med;  She describes a developing cough, prod of a small amt of green phlegm (no blood), assoc w/ increased SOB and sharp left upper CP, and decr energy "I was past going";  She went to urgent care where a CXR showed a left upper lobe opac "I was treated for pneumonia w/ 2 shots & Levaquin x 10d";  [facility] showing   Smoking Hx>  As above  Pulmonary Hx>  She has a hx of sinusitis w/ surg x2;  She has had occas bouts of bronchitis requiring antibiotics & pneumonia in 2015 treated w/ antibiotic & pred w/ resolution;  No hx asthma, no hx TB or known exposure;  She admits to mild cough, some chest congestion, but denied SOB/DOE before the last 2-3 yrs...  Medical Hx>  HBP (but not requiring meds now), HL (on diet alone), anxiety/ depression (on WellbutrinXL)..Marland KitchenMarland Kitchen Family Hx>  Father was a smoker w/ emphysema & died in his 798'sw/ CHF; mother died at 723w/ CHF; all 3 sibs have lung dis- 1Bro died 576'dw/ lung cancer, 1Sis died w/ pneumonia, 1Sis alive w/ COPD...  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  Current Meds>  Advair250, AlbuterolHFA, Mucinex, ASA81, MVIs, WellbutrinXL300... EXAM shows Afeb, VSS, O2sat=98% on RA at rest;  HEENT- neg, mallampati1;  Chest- decr BS at bases, w/o w/r/r;  Heart- RR w/o m/r/g;  Abd- soft, neg;  Ext- w/o c/c/e;  Neuro-  intact...   CXR 07/19/12 showed norm heart size, COPD w/ hyperinflation, resolved prev lingular infiltrate- now clear/ NAD...   [facility] athersclerotic calcif in Ao & branch vessels including the coronaries, no adenopathy, COPD changes noted, no pulm nodules, min RML atx  LABS 9/16 in Epic>  Chems- wnl;  CBC- wnl;  Norm VitD, B12, & Iron;  TSH=3.82...  [facility], underlying COPD w/ hyperinflation, 2.6cm density LUL medially- infiltrate vs mass...  CT Chest performed 01/27/15 [facility] showed left apical (medial/post) opacity assoc w/ centrilob emphysema changes & scattered areas of scarring  Spirometry 02/10/15 showed FVC=2.98 (97%), FEV1=1.59 (66%), %1sec=54%, mid-flows reduced at 33% predicted... c/w mod severe airflow obstruction & GOLD Stage2 COPD.  Ambulatory Oxygen saturation test on RA 02/10/15>  O2sat=97% w/ pulse=62/min;  She ambulated 3 laps w/ lowest O2sat=84% w/ pulse=102/min (SOB during a cough paroxysm)...  IMP/PLAN>>  [name] mod COPD & quit smoking 2014;  She had a lingular pneumonia in 2015 which resolved to baseline after antibiotics and pred rx;  She presented w/ acute LUL symptoms c/w pneumonia and an abn CXR=> slow to resolve after antibiotic rx;  I told her that in my opinion  we should give it a little longer to see if this represents XRay lag time- rec to take AUGMENTIN875Bid/Align, continue MUCINEX(2Bid)/ fluids, continue ADVAIR250Bid, and restart the INCRUSE daily;  We plan ROV in 3wks w/ CXR & to consider timing of a repeat CT, consider PET, etc....   ~  March 11, 2015:  80moROV w/ SN>  [name] that she is breathing sl better- notes sl congestion, sl sore throat, sl cough/ beige phlegm, sl left scapular pain, no f/c/s=> we discussed [facility]ukesMMW for throat & continue meds + Mucinex '1200mg'$ Bid w/ fluids...     COPD/ Emphysema> on Advair250Bid, Incruse daily,  Mucinex 600- 1to2Bid    Hx LUL pneumonia w/ left apical opac on CT 01/2015 slow to clear>     Ex-smoker w/ 65+pack-yr hx>  She quit smoking in 2014...    Hx sinusitis w/ surg x2 in the past EXAM shows Afeb, VSS, O2sat=99% on RA at rest;  HEENT- neg, mallampati1;  Chest- decr BS at bases, sl congested cough, w/o w/r/r;  Heart- RR w/o m/r/g;  Abd- soft, neg;  Ext- w/o c/c/e;  Neuro- intact...   CXR 03/11/15 showed norm heart size, hyperinflation w/ interstitial coarsening, NAD...  IMP/PLAN>>  Katrina Campbell Stage 2 COPD/ emphysema & prev LUL pneumonia w/ CT Chest 01/2015 showing left apical opacity (slow to clear); she has quit smoking (2014), takes Advair250Bid, Incruse daily, Mucinex, and we discussed need for incr exercise program... CXR today w/ COPD/emphysema & left apex looks ok- we decided to wait 243mo recheck CT at that time...     Past Medical History  Diagnosis Date  . Depression >> on WellbutrinXL300   . Anxiety   . COPD (chronic obstructive pulmonary disease) (HCC) >> on Advair250 & Incruse   . Hyperlipidemia   . Labile hypertension >> not current on BP meds      Past Surgical History  Procedure Laterality Date  . Nasal sinus surgery  1986    Outpatient Encounter Prescriptions as of 03/11/2015  Medication Sig  . albuterol (PROAIR HFA) 108 (90 BASE) MCG/ACT inhaler Inhale 2 puffs into the lungs every 6 (six) hours as needed for wheezing or shortness of breath.  . Marland Kitchenspirin 81 MG tablet Take 81 mg by mouth daily.  . Marland KitchenuPROPion (WELLBUTRIN XL) 300 MG 24 hr tablet TAKE 1 TABLET DAILY  . calcium carbonate (OS-CAL) 600 MG TABS tablet Take 600 mg by mouth daily with breakfast.  . fluticasone (FLONASE) 50 MCG/ACT nasal spray Place 1 spray into both nostrils 2 (two) times daily as needed for allergies or rhinitis.  . Fluticasone-Salmeterol (ADVAIR DISKUS) 250-50 MCG/DOSE AEPB USE 1 INHALATION TWICE A DAY  . guaiFENesin (MUCINEX) 600 MG 12 hr tablet Take by mouth daily.  . Marland Kitchenbuprofen (ADVIL)  200 MG tablet Take 400 mg by mouth every 8 (eight) hours as needed for fever.  . Multiple Vitamin (MULTIVITAMIN WITH MINERALS) TABS Take 1 tablet by mouth every morning.  . Marland Kitchenmeclidinium Bromide (INCRUSE ELLIPTA) 62.5 MCG/INH AEPB Inhale 1 puff into the lungs daily.  . vitamin C (ASCORBIC ACID) 500 MG tablet Take 500 mg by mouth every morning.  . [DISCONTINUED] amoxicillin-clavulanate (AUGMENTIN) 875-125 MG tablet Take 1 tablet by mouth 2 (two) times daily.    No Known Allergies   Current Medications, Allergies, Past Medical History, Past Surgical History, Family History, and Social History were reviewed in [employer].   Review of Systems  All symptoms NEG except where BOLDED >>  Constitutional:  F/C/S, fatigue, anorexia, unexpected weight change. HEENT:  HA, visual changes, hearing loss, earache, nasal symptoms, sore throat, mouth sores, hoarseness. Resp:  cough, sputum, hemoptysis; SOB, tightness, wheezing. Cardio:  CP, palpit, DOE, orthopnea, edema. GI:  N/V/D/C, blood in stool; reflux, abd pain, distention, gas. GU:  dysuria, freq, urgency, hematuria, flank pain, voiding difficulty. MS:  joint pain, swelling, tenderness, decr ROM; neck pain, back pain, etc. Neuro:  HA, tremors, seizures, dizziness, syncope, weakness, numbness, gait abn. Skin:  suspicious lesions or skin rash. Heme:  adenopathy, bruising, bleeding. Psyche:  confusion, agitation, sleep disturbance, hallucinations, anxiety, depression suicidal.   Objective:   Physical Exam       Vital Signs:  Reviewed...  General:  WD, WN, 59 y/o WF in NAD; alert & oriented; pleasant & cooperative... HEENT:  [place]/AT; Conjunctiva- pink, Sclera- nonicteric, EOM-wnl, PERRLA, EACs-clear, TMs-wnl; NOSE-clear; THROAT-clear & wnl. Neck:  Supple w/ fair ROM; no JVD; normal carotid impulses w/o bruits; no thyromegaly or nodules palpated; no lymphadenopathy. Chest:  Decr BS at bases, clear to P & A x  few scat rhonchi & sl congested cough- without wheezes, rales, or signs of consolidation. Heart:  Regular Rhythm; norm S1 & S2 without murmurs, rubs, or gallops detected. Abdomen:  Soft & nontender- no guarding or rebound; normal bowel sounds; no organomegaly or masses palpated. Ext:  Normal ROM; without deformities or arthritic changes; no varicose veins, venous insuffic, or edema;  Pulses intact w/o bruits. Neuro:  No focal neuro deficits; sensory testing normal; gait normal & balance OK. Derm:  No lesions noted; no rash etc. Lymph:  No cervical, supraclavicular, axillary, or inguinal adenopathy palpated.   Assessment:      IMP >>     COPD>  Mod airlow obstruction w/ GOLD Stage2 COPD; on Advair250, restart Incruse daily + Mucinex, Proair, etc...    Abn CXR/ CTChest>  LUL opacity- r/o pneumonia/ tumor/ etc...    HxPneumonia>  He had lingular pneumonia 2015 that responded to antibiotics and pred...    Ex-smoker>  She has ~65 pack-yr hx and quit in 2014...    Atherosclerotic calcif in Ao, branch vessels, and coronaries on prev CTs...   PLAN >>  11/8>  Katrina Campbell has mod COPD & quit smoking 2014;  She had a lingular pneumonia in 2015 which resolved to baseline after antibiotics and pred rx;  She presented w/ acute LUL symptoms c/w pneumonia and an abn CXR=> slow to resolve after antibiotic rx;  I told her that in my opinion we should give it a little longer to see if this represents XRay lag time- rec to take AUGMENTIN875Bid/Align, continue MUCINEX(2Bid)/ fluids, continue ADVAIR250Bid, and restart the INCRUSE daily;  We plan ROV in 3wks w/ CXR & to consider timing of a repeat CT, consider PET, etc... 12/7>  Katrina Campbell has Stage 2 COPD/ emphysema & prev LUL pneumonia w/ CT Chest 01/2015 showing left apical opacity (slow to clear); she has quit smoking (2014), takes Advair250Bid, Incruse daily, Mucinex, and we discussed need for incr exercise program... CXR today w/ COPD/emphysema & left apex looks ok- we  decided to wait 48mo& recheck CT at that time     Plan:     Patient's Medications  New Prescriptions   METHYLPREDNISOLONE (MEDROL) 8 MG TABLET    Use as directed  Previous Medications   ALBUTEROL (PROAIR HFA) 108 (90 BASE) MCG/ACT INHALER    Inhale 2 puffs into the lungs every  6 (six) hours as needed for wheezing or shortness of breath.   ASPIRIN 81 MG TABLET    Take 81 mg by mouth daily.   BUPROPION (WELLBUTRIN XL) 300 MG 24 HR TABLET    TAKE 1 TABLET DAILY   CALCIUM CARBONATE (OS-CAL) 600 MG TABS TABLET    Take 600 mg by mouth daily with breakfast.   FLUTICASONE (FLONASE) 50 MCG/ACT NASAL SPRAY    Place 1 spray into both nostrils 2 (two) times daily as needed for allergies or rhinitis.   FLUTICASONE-SALMETEROL (ADVAIR DISKUS) 250-50 MCG/DOSE AEPB    USE 1 INHALATION TWICE A DAY   GUAIFENESIN (MUCINEX) 600 MG 12 HR TABLET    Take by mouth daily.   IBUPROFEN (ADVIL) 200 MG TABLET    Take 400 mg by mouth every 8 (eight) hours as needed for fever.   MULTIPLE VITAMIN (MULTIVITAMIN WITH MINERALS) TABS    Take 1 tablet by mouth every morning.   UMECLIDINIUM BROMIDE (INCRUSE ELLIPTA) 62.5 MCG/INH AEPB    Inhale 1 puff into the lungs daily.   VITAMIN C (ASCORBIC ACID) 500 MG TABLET    Take 500 mg by mouth every morning.  Modified Medications   No medications on file  Discontinued Medications   AMOXICILLIN-CLAVULANATE (AUGMENTIN) 875-125 MG TABLET    Take 1 tablet by mouth 2 (two) times daily.

## 2015-03-23 ENCOUNTER — Encounter: Payer: Self-pay | Admitting: Pulmonary Disease

## 2015-03-30 ENCOUNTER — Encounter: Payer: Self-pay | Admitting: *Deleted

## 2015-04-25 ENCOUNTER — Encounter: Payer: Self-pay | Admitting: *Deleted

## 2015-05-12 ENCOUNTER — Ambulatory Visit (INDEPENDENT_AMBULATORY_CARE_PROVIDER_SITE_OTHER): Admitting: Pulmonary Disease

## 2015-05-12 ENCOUNTER — Encounter: Payer: Self-pay | Admitting: Pulmonary Disease

## 2015-05-12 ENCOUNTER — Ambulatory Visit (INDEPENDENT_AMBULATORY_CARE_PROVIDER_SITE_OTHER)
Admission: RE | Admit: 2015-05-12 | Discharge: 2015-05-12 | Disposition: A | Discharge status: Home / Self Care | Source: Ambulatory Visit | Attending: Pulmonary Disease | Admitting: Pulmonary Disease

## 2015-05-12 ENCOUNTER — Telehealth: Payer: Self-pay | Admitting: Pulmonary Disease

## 2015-05-12 VITALS — BP 138/78 | HR 68 | Temp 99.4°F | Ht 64.0 in | Wt 129.4 lb

## 2015-05-12 DIAGNOSIS — J449 Chronic obstructive pulmonary disease, unspecified: Secondary | ICD-10-CM

## 2015-05-12 DIAGNOSIS — R9389 Abnormal findings on diagnostic imaging of other specified body structures: Secondary | ICD-10-CM

## 2015-05-12 DIAGNOSIS — R938 Abnormal findings on diagnostic imaging of other specified body structures: Secondary | ICD-10-CM

## 2015-05-12 MED ORDER — FIRST-DUKES MOUTHWASH MT SUSP: 5.0000 mL | Freq: Four times a day (QID) | OROMUCOSAL | Status: DC | PRN

## 2015-05-12 NOTE — Telephone Encounter (Signed)
Spoke with Ronalee Belts with [facility].  Was advised there's several variations in proportions of [facility] MMW and is usually sent in with rx.  Per SN, see if pt is ok with rx being sent to another pharmacay.  Spoke with pt.  States her insurance will only cover rxs at [facility].    Spoke with Ronalee Belts at [facility].  Their standard MMW includes the following: 40 mg of hydrocortisone 25 mL of nystatin  155 mL of benadryl  Spoke with SN.  Per SN, about proportions for MMW is fine.  Ronalee Belts aware and voiced no further questions or concerns at this time.

## 2015-05-12 NOTE — Patient Instructions (Signed)
Today we updated your med list in our EPIC system...    Continue your current medications the same...  We discussed taking the ALBUTEROL rescue inhaler 1st thing in the AM...    Then following this w/ your CYELYH909- 2 sprays, and the INCRUSE- one inhalation...    Then brush & rinse your mouth to avoid side effects...  We wrote for the [facility] Magic Mouthwash> one tsp gargle & swallow up to 4 times daily as needed for your throat...  Continue the Mucinex '1200mg'$  twice daily + extra fluids...  Finally we checked a follow up CXR today & will sched a repeat CT Chest as well (please give the radiologist the CT [place] of your old scan)...  Call for any questions...  Let's plan a follow up visit in 60month, sooner if needed for problems..Marland KitchenMarland Kitchen

## 2015-05-12 NOTE — Telephone Encounter (Signed)
Pharmacy is requesting what ingredients and how much of each in [facility] MMW rx.  (? Benadryl, Nystatin, Hydrocortisone).  Please advise

## 2015-05-21 ENCOUNTER — Ambulatory Visit (INDEPENDENT_AMBULATORY_CARE_PROVIDER_SITE_OTHER)
Admission: RE | Admit: 2015-05-21 | Discharge: 2015-05-21 | Disposition: A | Discharge status: Home / Self Care | Source: Ambulatory Visit | Attending: Pulmonary Disease | Admitting: Pulmonary Disease

## 2015-05-21 DIAGNOSIS — R9389 Abnormal findings on diagnostic imaging of other specified body structures: Secondary | ICD-10-CM

## 2015-05-21 DIAGNOSIS — R938 Abnormal findings on diagnostic imaging of other specified body structures: Secondary | ICD-10-CM

## 2015-05-21 DIAGNOSIS — J449 Chronic obstructive pulmonary disease, unspecified: Secondary | ICD-10-CM | POA: Diagnosis not present

## 2015-05-25 ENCOUNTER — Telehealth: Payer: Self-pay | Admitting: Pulmonary Disease

## 2015-05-25 NOTE — Telephone Encounter (Signed)
Pt is requesting CT chest results from 05/22/15 (in epic). SN is off all week on schedule. Will forward to DOD to advise on results as pt is requesting results. Please advise MR thanks

## 2015-05-25 NOTE — Telephone Encounter (Signed)
Spoke with the pt and notified of results per MR She verbalized understanding  Nothing further needed

## 2015-05-25 NOTE — Telephone Encounter (Signed)
IMPRESSION: Prior patchy opacity in the medial left lung apex is no longer visualized. Mild residual post infectious/inflammatory scarring.  4 mm nodule along the minor fissure. Follow-up CT chest is suggested in 12 months.  Electronically Signed  By: Julian Hy M.D.  On: 05/22/2015 12:03      Above when compared to oct 2016

## 2015-05-28 ENCOUNTER — Other Ambulatory Visit: Payer: Self-pay | Admitting: Pulmonary Disease

## 2015-05-28 DIAGNOSIS — J449 Chronic obstructive pulmonary disease, unspecified: Secondary | ICD-10-CM

## 2015-05-28 NOTE — Progress Notes (Signed)
Quick Note:  Called spoke with patient, advised of CT results / recs as stated by SN. Pt verbalized her understanding and denied any questions. Pt is already scheduled for follow up w/ SN in August 2017, will add to note and create orders only encounter for cxr. ______

## 2015-05-30 NOTE — Progress Notes (Signed)
Subjective:     Patient ID: Katrina Campbell, female   DOB: September 28, 1955, 60 y.o.   MRN: [id number]  HPI ~  February 10, 2015:  Initial pulmonary consult by SN>  38 y/o WF, referred by Katrina Campbell & Katrina Campbell for an abn CXR>      Katrina Campbell is an ex-smoker, having started in her teens and smoked for ~45 yrs up to 2ppd, for an est 75 pack-yr smoking hx (she quit in 2014 because her fingers were purple);  She carries the Dx of underlying COPD & was placed on Advair250, also tried Incruse x 37mobut she stopped this med;  She describes a developing cough, prod of a small amt of green phlegm (no blood), assoc w/ increased SOB and sharp left upper CP, and decr energy "I was past going";  She went to urgent care where a CXR showed a left upper lobe opac "I was treated for pneumonia w/ 2 shots & Levaquin x 10d";  [facility] CT [facility] showing   Smoking Hx>  As above  Pulmonary Hx>  She has a hx of sinusitis w/ surg x2;  She has had occas bouts of bronchitis requiring antibiotics & pneumonia in 2015 treated w/ antibiotic & pred w/ resolution;  No hx asthma, no hx TB or known exposure;  She admits to mild cough, some chest congestion, but denied SOB/DOE before the last 2-3 yrs...  Medical Hx>  HBP (but not requiring meds now), HL (on diet alone), anxiety/ depression (on WellbutrinXL)..Katrina KitchenMarland Campbell Family Hx>  Father was a smoker w/ emphysema & died in his 727'sw/ CHF; mother died at 722w/ CHF; all 3 sibs have lung dis- 1Bro died 59'dw/ lung cancer, 1Sis died w/ pneumonia, 1Sis alive w/ COPD...  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  Current Meds>  Advair250, AlbuterolHFA, Mucinex, ASA81, MVIs, WellbutrinXL300... EXAM shows Afeb, VSS, O2sat=98% on RA at rest;  HEENT- neg, mallampati1;  Chest- decr BS at bases, w/o w/r/r;  Heart- RR w/o m/r/g;  Abd- soft, neg;  Ext- w/o c/c/e;  Neuro-  intact...   CXR 07/19/12 showed norm heart size, COPD w/ hyperinflation, resolved prev lingular infiltrate- now clear/ NAD...   [facility] showed athersclerotic calcif in Ao & branch vessels including the coronaries, no adenopathy, COPD changes noted, no pulm nodules, min RML atx  LABS 9/16 in Epic>  Chems- wnl;  CBC- wnl;  Norm VitD, B12, & Iron;  TSH=3.82...  [facility], underlying COPD w/ hyperinflation, 2.6cm density LUL medially- infiltrate vs mass...  CT Chest performed 01/27/15 [facility] showed left apical (medial/post) opacity assoc w/ centrilob emphysema changes & scattered areas of scarring  Spirometry 02/10/15 showed FVC=2.98 (97%), FEV1=1.59 (66%), %1sec=54%, mid-flows reduced at 33% predicted... c/w mod severe airflow obstruction & GOLD Stage2 COPD.  Ambulatory Oxygen saturation test on RA 02/10/15>  O2sat=97% w/ pulse=62/min;  She ambulated 3 laps w/ lowest O2sat=84% w/ pulse=102/min (SOB during a cough paroxysm)...  IMP/PLAN>>  CLeolahas mod COPD & quit smoking 2014;  She had a lingular pneumonia in 2015 which resolved to baseline after antibiotics and pred rx;  She presented w/ acute LUL symptoms c/w pneumonia and an abn CXR=> slow to resolve after antibiotic rx;  I told her that in my opinion  we should give it a little longer to see if this represents XRay lag time- rec to take AUGMENTIN875Bid/Align, continue MUCINEX(2Bid)/ fluids, continue ADVAIR250Bid, and restart the INCRUSE daily;  We plan ROV in 3wks w/ CXR & to consider timing of a repeat CT, consider PET, etc....   ~  March 11, 2015:  75moROV w/ SN>  CAnjuliereports that she is breathing sl better- notes sl congestion, sl sore throat, sl cough/ beige phlegm, sl left scapular pain, no f/c/s=> we discussed DukesMMW for throat & continue meds + Mucinex '1200mg'$ Bid w/ fluids...     COPD/ Emphysema> on Advair250Bid, Incruse daily,  Mucinex 600- 1to2Bid    Hx LUL pneumonia w/ left apical opac on CT 01/2015 slow to clear>     Ex-smoker w/ 65+pack-yr hx>  She quit smoking in 2014...    Hx sinusitis w/ surg x2 in the past EXAM shows Afeb, VSS, O2sat=99% on RA at rest;  HEENT- neg, mallampati1;  Chest- decr BS at bases, sl congested cough, w/o w/r/r;  Heart- RR w/o m/r/g;  Abd- soft, neg;  Ext- w/o c/c/e;  Neuro- intact...   CXR 03/11/15 showed norm heart size, hyperinflation w/ interstitial coarsening, NAD...  IMP/PLAN>>  CLancehas Stage 2 COPD/ emphysema & prev LUL pneumonia w/ CT Chest 01/2015 showing left apical opacity (slow to clear); she has quit smoking (2014), takes Advair250Bid, Incruse daily, Mucinex, and we discussed need for incr exercise program... CXR today w/ COPD/emphysema & left apex looks ok- we decided to wait 268mo recheck CT at that time...   ~  May 12, 2015:  62m25moV & Katrina Campbell some chr congestion, clear sput, & DOE- she continues on Advbair250, Incruse, Proair, Mucinex, Fluids... It is time for her f/u CT Chest...    COPD/ Emphysema> on Advair250Bid, Incruse daily, Proair, Mucinex 600- 1to2Bid; Mod airflow obstruction w/ GOLD Stage2 COPD    Hx LUL pneumonia w/ left apical opac on CT 01/2015 slow to clear> also had lingular pneumonia 2015    Ex-smoker w/ 65+pack-yr hx>  She quit smoking in 2014...    Hx sinusitis w/ surg x2 in the past    Atherosclerotic calcif in Ao, branch vessels, and coronaries on prev CTs...  EXAM shows Afeb, VSS, O2sat=97% on RA at rest;  HEENT- neg, mallampati1;  Chest- decr BS at bases, sl congested cough, w/o w/r/r;  Heart- RR w/o m/r/g;  Abd- soft, neg;  Ext- w/o c/c/e;  Neuro- intact  CXR 05/12/15 showed norm heart size, hyperinflation/ clear lungs/ NAD...   CT Chest 05/22/15 showed norm heart size, coronary calcif & atherosclerotic calcif of Ao, no signif adenopathy, mild centrilob & pqaraseptal emphysema, prev left apical opac is resolved w/ mild resid scarring, new 4mm262module along minor fissure & radiology rec f/u CT in 35yr.5yrMP/PLAN>>  Left apical opac is resolved, new 4mm n68mle in right mid-zone noted; for the cough she will continue the Mucinex and for the Dyspnea- advised again to start exercise program... We plan ROV in 58mo.  [facility]most Medical History  Diagnosis Date  . Depression >> on WellbutrinXL300   . Anxiety   . COPD (chronic obstructive pulmonary disease) (HCC) >> on Advair250 & Incruse   . Hyperlipidemia   . Labile hypertension >> not current on BP meds      Past Surgical History  Procedure Laterality Date  . Nasal sinus surgery  1986    Outpatient Encounter Prescriptions as of 03/11/2015  Medication Sig  .  albuterol (PROAIR HFA) 108 (90 BASE) MCG/ACT inhaler Inhale 2 puffs into the lungs every 6 (six) hours as needed for wheezing or shortness of breath.  Katrina Campbell aspirin 81 MG tablet Take 81 mg by mouth daily.  Katrina Campbell buPROPion (WELLBUTRIN XL) 300 MG 24 hr tablet TAKE 1 TABLET DAILY  . calcium carbonate (OS-CAL) 600 MG TABS tablet Take 600 mg by mouth daily with breakfast.  . fluticasone (FLONASE) 50 MCG/ACT nasal spray Place 1 spray into both nostrils 2 (two) times daily as needed for allergies or rhinitis.  . Fluticasone-Salmeterol (ADVAIR DISKUS) 250-50 MCG/DOSE AEPB USE 1 INHALATION TWICE A DAY  . guaiFENesin (MUCINEX) 600 MG 12 hr tablet Take by mouth daily.  Katrina Campbell ibuprofen (ADVIL) 200 MG tablet Take 400 mg by mouth every 8 (eight) hours as needed for fever.  . Multiple Vitamin (MULTIVITAMIN WITH MINERALS) TABS Take 1 tablet by mouth every morning.  Katrina Campbell Umeclidinium Bromide (INCRUSE ELLIPTA) 62.5 MCG/INH AEPB Inhale 1 puff into the lungs daily.  . vitamin C (ASCORBIC ACID) 500 MG tablet Take 500 mg by mouth every morning.  . [DISCONTINUED] amoxicillin-clavulanate (AUGMENTIN) 875-125 MG tablet Take 1 tablet by mouth 2 (two) times daily.    No Known Allergies   Current Medications, Allergies, Past Medical History, Past Surgical History, Family  History, and Social History were reviewed in [employer] record.   Review of Systems             All symptoms NEG except where BOLDED >>  Constitutional:  F/C/S, fatigue, anorexia, unexpected weight change. HEENT:  HA, visual changes, hearing loss, earache, nasal symptoms, sore throat, mouth sores, hoarseness. Resp:  cough, sputum, hemoptysis; SOB, tightness, wheezing. Cardio:  CP, palpit, DOE, orthopnea, edema. GI:  N/V/D/C, blood in stool; reflux, abd pain, distention, gas. GU:  dysuria, freq, urgency, hematuria, flank pain, voiding difficulty. MS:  joint pain, swelling, tenderness, decr ROM; neck pain, back pain, etc. Neuro:  HA, tremors, seizures, dizziness, syncope, weakness, numbness, gait abn. Skin:  suspicious lesions or skin rash. Heme:  adenopathy, bruising, bleeding. Psyche:  confusion, agitation, sleep disturbance, hallucinations, anxiety, depression suicidal.   Objective:   Physical Exam       Vital Signs:  Reviewed...  General:  WD, WN, 60 y/o WF in NAD; alert & oriented; pleasant & cooperative... HEENT:  [place]/AT; Conjunctiva- pink, Sclera- nonicteric, EOM-wnl, PERRLA, EACs-clear, TMs-wnl; NOSE-clear; THROAT-clear & wnl. Neck:  Supple w/ fair ROM; no JVD; normal carotid impulses w/o bruits; no thyromegaly or nodules palpated; no lymphadenopathy. Chest:  Decr BS at bases, clear to P & A x few scat rhonchi & sl congested cough- without wheezes, rales, or signs of consolidation. Heart:  Regular Rhythm; norm S1 & S2 without murmurs, rubs, or gallops detected. Abdomen:  Soft & nontender- no guarding or rebound; normal bowel sounds; no organomegaly or masses palpated. Ext:  Normal ROM; without deformities or arthritic changes; no varicose veins, venous insuffic, or edema;  Pulses intact w/o bruits. Neuro:  No focal neuro deficits; sensory testing normal; gait normal & balance OK. Derm:  No lesions noted; no rash etc. Lymph:  No cervical, supraclavicular,  axillary, or inguinal adenopathy palpated.   Assessment:      IMP >>     COPD>  Mod airlow obstruction w/ GOLD Stage2 COPD; on Advair250, restart Incruse daily + Mucinex, Proair, etc...    Abn CXR/ CTChest>  LUL opacity- r/o pneumonia/ tumor/ etc...    HxPneumonia>  He had lingular pneumonia 2015 that  responded to antibiotics and pred...    Ex-smoker>  She has ~65 pack-yr hx and quit in 2014...    Atherosclerotic calcif in Ao, branch vessels, and coronaries on prev CTs...   PLAN >>  11/8>  Amariyana has mod COPD & quit smoking 2014;  She had a lingular pneumonia in 2015 which resolved to baseline after antibiotics and pred rx;  She presented w/ acute LUL symptoms c/w pneumonia and an abn CXR=> slow to resolve after antibiotic rx;  I told her that in my opinion we should give it a little longer to see if this represents XRay lag time- rec to take AUGMENTIN875Bid/Align, continue MUCINEX(2Bid)/ fluids, continue ADVAIR250Bid, and restart the INCRUSE daily;  We plan ROV in 3wks w/ CXR & to consider timing of a repeat CT, consider PET, etc... 12/7>  Tsuyako has Stage 2 COPD/ emphysema & prev LUL pneumonia w/ CT Chest 01/2015 showing left apical opacity (slow to clear); she has quit smoking (2014), takes Advair250Bid, Incruse daily, Mucinex, and we discussed need for incr exercise program... CXR today w/ COPD/emphysema & left apex looks ok- we decided to wait 38mo& recheck CT at that time.  2/7>  Left apical opac is resolved, new 415mnodule in right mid-zone noted; for the cough she will continue the Mucinex and for the Dyspnea- advised again to start exercise program... We plan ROV in 64m3mo   Plan:     Patient's Medications  New Prescriptions   DIPHENHYD-HYDROCORT-NYSTATIN (FIRST-DUKES MOUTHWASH) SUSP    Use as directed 5 mLs in the mouth or throat 4 (four) times daily as needed (gargle and swallow).  Previous Medications   ALBUTEROL (PROAIR HFA) 108 (90 BASE) MCG/ACT INHALER    Inhale 2 puffs into  the lungs every 6 (six) hours as needed for wheezing or shortness of breath.   ASPIRIN 81 MG TABLET    Take 81 mg by mouth daily.   BUPROPION (WELLBUTRIN XL) 300 MG 24 HR TABLET    TAKE 1 TABLET DAILY   CALCIUM CARBONATE (OS-CAL) 600 MG TABS TABLET    Take 600 mg by mouth daily with breakfast.   FLUTICASONE (FLONASE) 50 MCG/ACT NASAL SPRAY    Place 1 spray into both nostrils 2 (two) times daily as needed for allergies or rhinitis.   FLUTICASONE-SALMETEROL (ADVAIR DISKUS) 250-50 MCG/DOSE AEPB    USE 1 INHALATION TWICE A DAY   GUAIFENESIN (MUCINEX) 600 MG 12 HR TABLET    Take 1,200 mg by mouth 2 (two) times daily.    IBUPROFEN (ADVIL) 200 MG TABLET    Take 400 mg by mouth every 8 (eight) hours as needed for fever.   MULTIPLE VITAMIN (MULTIVITAMIN WITH MINERALS) TABS    Take 1 tablet by mouth every morning.   UMECLIDINIUM BROMIDE (INCRUSE ELLIPTA) 62.5 MCG/INH AEPB    Inhale 1 puff into the lungs daily.   VITAMIN C (ASCORBIC ACID) 500 MG TABLET    Take 500 mg by mouth every morning.  Modified Medications   No medications on file  Discontinued Medications   METHYLPREDNISOLONE (MEDROL) 8 MG TABLET    Use as directed

## 2015-07-07 ENCOUNTER — Encounter: Payer: Self-pay | Admitting: Internal Medicine

## 2015-07-07 ENCOUNTER — Ambulatory Visit (INDEPENDENT_AMBULATORY_CARE_PROVIDER_SITE_OTHER): Admitting: Internal Medicine

## 2015-07-07 VITALS — BP 138/82 | HR 84 | Temp 99.0°F | Resp 18 | Ht 65.5 in | Wt 129.0 lb

## 2015-07-07 DIAGNOSIS — R300 Dysuria: Secondary | ICD-10-CM | POA: Diagnosis not present

## 2015-07-07 MED ORDER — CIPROFLOXACIN HCL 500 MG PO TABS: 500.0000 mg | ORAL_TABLET | Freq: Two times a day (BID) | ORAL | Status: AC

## 2015-07-07 MED ORDER — PHENAZOPYRIDINE HCL 100 MG PO TABS: 100.0000 mg | ORAL_TABLET | Freq: Three times a day (TID) | ORAL | Status: DC | PRN

## 2015-07-07 NOTE — Progress Notes (Signed)
   Subjective:    Patient ID: Katrina Campbell, female    DOB: 31-May-1955, 60 y.o.   MRN: [id number]  Dysuria  This is a new problem. The current episode started yesterday. The problem occurs every urination. The problem has been rapidly worsening. The quality of the pain is described as burning and stabbing. The pain is severe. There has been no fever. Associated symptoms include chills, frequency, hematuria, hesitancy, nausea and urgency. Pertinent negatives include no discharge, flank pain or vomiting.      Review of Systems  Constitutional: Positive for chills and fatigue. Negative for fever.  Gastrointestinal: Positive for nausea, abdominal pain and constipation. Negative for vomiting and diarrhea.  Genitourinary: Positive for dysuria, hesitancy, urgency, frequency and hematuria. Negative for flank pain, vaginal bleeding, vaginal discharge and vaginal pain.       Objective:   Physical Exam  Constitutional: She is oriented to person, place, and time. She appears well-developed and well-nourished. No distress.  HENT:  Head: Normocephalic.  Mouth/Throat: Oropharynx is clear and moist. No oropharyngeal exudate.  Eyes: Conjunctivae are normal. No scleral icterus.  Neck: Normal range of motion. Neck supple. No JVD present. No thyromegaly present.  Cardiovascular: Normal rate, regular rhythm, normal heart sounds and intact distal pulses.  Exam reveals no gallop and no friction rub.   No murmur heard. Pulmonary/Chest: Effort normal and breath sounds normal. No respiratory distress. She has no wheezes. She has no rales. She exhibits no tenderness.  Abdominal: Soft. Normal appearance and bowel sounds are normal. She exhibits no distension and no mass. There is tenderness in the suprapubic area. There is CVA tenderness. There is no rigidity, no rebound, no guarding, no tenderness at McBurney's point and negative Murphy's sign.  Musculoskeletal: Normal range of motion.  Lymphadenopathy:    She has  no cervical adenopathy.  Neurological: She is alert and oriented to person, place, and time.  Skin: Skin is warm and dry. She is not diaphoretic.  Psychiatric: She has a normal mood and affect. Her behavior is normal. Judgment and thought content normal.  Nursing note and vitals reviewed.   Filed Vitals:   07/07/15 1107  BP: 138/82  Pulse: 84  Temp: 99 F (37.2 C)  Resp: 18          Assessment & Plan:    1. Dysuria -cipro 500 mg BID x 10 days -pyridium prn -ibuprofen prn -likely cystitis - Urinalysis, Routine w reflex microscopic (not at [facility]) - Urine culture

## 2015-07-07 NOTE — Patient Instructions (Signed)
Please take cipro twice daily until the medication is gone unless you hear otherwise from me.  Please take pyridium up to 3 times daily to help with bladder spasms and urinary frequency.  For the next 48 hours please take ibuprofen 800 mg three times daily to help with pain and inflammation of the bladder.    If you get fever, nausea and vomiting that don't allow you to take your antibiotic, or severe pain under your ribs please go directly to the ER.

## 2015-07-08 LAB — URINALYSIS, ROUTINE W REFLEX MICROSCOPIC
Bilirubin Urine: NEGATIVE
GLUCOSE, UA: NEGATIVE
KETONES UR: NEGATIVE
NITRITE: POSITIVE — AB
PH: 6 (ref 5.0–8.0)
SPECIFIC GRAVITY, URINE: 1.007 (ref 1.001–1.035)

## 2015-07-08 LAB — URINALYSIS, MICROSCOPIC ONLY
CRYSTALS: NONE SEEN [HPF]
Casts: NONE SEEN [LPF]
RBC / HPF: NONE SEEN RBC/HPF (ref <=2)
Yeast: NONE SEEN [HPF]

## 2015-07-10 LAB — URINE CULTURE

## 2015-07-21 ENCOUNTER — Telehealth: Payer: Self-pay | Admitting: Internal Medicine

## 2015-07-21 NOTE — Telephone Encounter (Signed)
[facility] refused to do screening mammo, patient indicates a palable lump under her left arm pit. Also states that her bicep to her elbow is painful. Please advise.

## 2015-08-06 ENCOUNTER — Ambulatory Visit: Payer: Self-pay

## 2015-08-13 ENCOUNTER — Ambulatory Visit (INDEPENDENT_AMBULATORY_CARE_PROVIDER_SITE_OTHER)

## 2015-08-13 DIAGNOSIS — R8271 Bacteriuria: Secondary | ICD-10-CM

## 2015-08-14 LAB — URINALYSIS, ROUTINE W REFLEX MICROSCOPIC
Bilirubin Urine: NEGATIVE
GLUCOSE, UA: NEGATIVE
HGB URINE DIPSTICK: NEGATIVE
Ketones, ur: NEGATIVE
LEUKOCYTES UA: NEGATIVE
NITRITE: NEGATIVE
PROTEIN: NEGATIVE
Specific Gravity, Urine: 1.008 (ref 1.001–1.035)
pH: 7 (ref 5.0–8.0)

## 2015-08-14 LAB — URINE CULTURE
COLONY COUNT: NO GROWTH
ORGANISM ID, BACTERIA: NO GROWTH

## 2015-08-21 ENCOUNTER — Telehealth: Payer: Self-pay | Admitting: Pulmonary Disease

## 2015-08-21 MED ORDER — AMOXICILLIN-POT CLAVULANATE 875-125 MG PO TABS: 1.0000 | ORAL_TABLET | Freq: Two times a day (BID) | ORAL | Status: DC

## 2015-08-21 NOTE — Telephone Encounter (Signed)
Spoke with pt, aware of rec's. Rx called in to pharmacy requested. Nothing further needed.

## 2015-08-21 NOTE — Telephone Encounter (Signed)
Spoke with pt and she states that she started coughing several days ago. She has productive cough with green mucus. Pt denies wheeze/SOB/CP/tightness or f/n/v. Pt has been taking Mucinex 1200 BID and using her albuterol HFA with some improvement. Pt states that SN usually sends in abx for her.   SN out of office. MW please advise. Thanks!   Patient Instructions     Today we updated your med list in our EPIC system...  Continue your current medications the same...  We discussed taking the ALBUTEROL rescue inhaler 1st thing in the AM...  Then following this w/ your [id number]- 2 sprays, and the INCRUSE- one inhalation...  Then brush & rinse your mouth to avoid side effects...  We wrote for the [facility] Magic Mouthwash> one tsp gargle & swallow up to 4 times daily as needed for your throat...  Continue the Mucinex '1200mg'$  twice daily + extra fluids...  Finally we checked a follow up CXR today & will sched a repeat CT Chest as well (please give the radiologist the CT [place] of your old scan)...  Call for any questions...  Let's plan a follow up visit in 38month, sooner if needed for problems..Marland KitchenMarland Kitchen

## 2015-08-21 NOTE — Telephone Encounter (Signed)
Augmentin 875 mg take one pill twice daily  X 10 days - take at breakfast and supper with large glass of water.  It would help reduce the usual side effects (diarrhea and yeast infections) if you ate cultured yogurt at lunch.  

## 2015-09-03 ENCOUNTER — Other Ambulatory Visit: Payer: Self-pay | Admitting: Internal Medicine

## 2015-09-23 ENCOUNTER — Encounter: Payer: Self-pay | Admitting: Internal Medicine

## 2015-09-23 ENCOUNTER — Ambulatory Visit (INDEPENDENT_AMBULATORY_CARE_PROVIDER_SITE_OTHER): Admitting: Internal Medicine

## 2015-09-23 VITALS — BP 128/72 | HR 56 | Temp 98.2°F | Resp 16 | Ht 65.5 in | Wt 134.0 lb

## 2015-09-23 DIAGNOSIS — M7552 Bursitis of left shoulder: Secondary | ICD-10-CM | POA: Diagnosis not present

## 2015-09-23 MED ORDER — DIAZEPAM 5 MG PO TABS: 5.0000 mg | ORAL_TABLET | Freq: Every evening | ORAL | Status: DC | PRN

## 2015-09-23 MED ORDER — MELOXICAM 15 MG PO TABS: 15.0000 mg | ORAL_TABLET | Freq: Every day | ORAL | Status: DC

## 2015-09-23 NOTE — Progress Notes (Signed)
   Subjective:    Patient ID: Katrina Campbell, female    DOB: 1955-10-20, 60 y.o.   MRN: [id number]  Arm Pain  Pertinent negatives include no chest pain.  Patient presents to the office for evaluation of left shoulder pain x 2 months.  She notes that the pain is like a constant throbbing aching feeling.  It does really bother her when she lays on that side and when she has to put her seat belt on or take her bra off.  She did not have any  Injury that she can think of.  No prior injuries in the past.  She is right hand dominant.  No relief with advil.  She reports that it didn't help that much.  She has not noticed tingling, numbness, no swelling, some weakness.    Review of Systems  Constitutional: Negative for fever, chills and fatigue.  Respiratory: Negative for chest tightness and shortness of breath.   Cardiovascular: Negative for chest pain and palpitations.  Gastrointestinal: Negative for nausea, vomiting, abdominal pain, diarrhea and constipation.  Genitourinary: Positive for dysuria, urgency and frequency. Negative for hematuria, flank pain, vaginal bleeding, vaginal discharge, difficulty urinating and vaginal pain.       Objective:   Physical Exam  Constitutional: She is oriented to person, place, and time. She appears well-developed and well-nourished. No distress.  HENT:  Head: Normocephalic.  Mouth/Throat: Oropharynx is clear and moist. No oropharyngeal exudate.  Eyes: Conjunctivae are normal. No scleral icterus.  Neck: Normal range of motion. Neck supple. No JVD present. No thyromegaly present.  Cardiovascular: Normal rate, regular rhythm, normal heart sounds and intact distal pulses.  Exam reveals no gallop and no friction rub.   No murmur heard. Pulmonary/Chest: Effort normal and breath sounds normal. No respiratory distress. She has no wheezes. She has no rales. She exhibits no tenderness.  Abdominal: Soft. Bowel sounds are normal. She exhibits no distension and no mass. There  is no tenderness. There is no rebound and no guarding.  Musculoskeletal: Normal range of motion.  Lymphadenopathy:    She has no cervical adenopathy.  Neurological: She is alert and oriented to person, place, and time.  Skin: Skin is warm and dry. She is not diaphoretic.  Psychiatric: She has a normal mood and affect. Her behavior is normal. Judgment and thought content normal.  Nursing note and vitals reviewed.   Filed Vitals:   09/23/15 1033  BP: 128/72  Pulse: 56  Temp: 98.2 F (36.8 C)  Resp: 16          Assessment & Plan:    1. Shoulder bursitis, left -mobic daily -valium qhs -likely needs PT and intraarticular injection for bursitis -rotator cuff is intact - Ambulatory referral to Orthopedic Surgery

## 2015-11-09 ENCOUNTER — Encounter: Payer: Self-pay | Admitting: Pulmonary Disease

## 2015-11-09 ENCOUNTER — Ambulatory Visit (INDEPENDENT_AMBULATORY_CARE_PROVIDER_SITE_OTHER): Admitting: Pulmonary Disease

## 2015-11-09 VITALS — BP 122/70 | HR 64 | Temp 98.2°F | Ht 64.0 in | Wt 137.1 lb

## 2015-11-09 DIAGNOSIS — R9389 Abnormal findings on diagnostic imaging of other specified body structures: Secondary | ICD-10-CM

## 2015-11-09 DIAGNOSIS — J449 Chronic obstructive pulmonary disease, unspecified: Secondary | ICD-10-CM | POA: Diagnosis not present

## 2015-11-09 DIAGNOSIS — R938 Abnormal findings on diagnostic imaging of other specified body structures: Secondary | ICD-10-CM

## 2015-11-09 NOTE — Progress Notes (Signed)
Subjective:     Patient ID: Katrina Campbell, female   DOB: Aug 20, 1955, 60 y.o.   MRN: [id number]  HPI   60 y/o WF, former smoker who quit in 2014, w/ COPD/Emphysema (GOLD Stage 2), Hx LUL pneumonia 10/16 that was slow to clear, f/u CT Chest 2/17 w/ resolution of LUL changes (scarring) but coronary & Aortic atherosclerotic calcif, mild centrilob & pqaraseptal emphysema, & a new 46m nodule along minor fissure => needs f/u...  ~  February 10, 2015:  Initial pulmonary consult by SN>  89y/o WF, referred by [name]& Dr. BGlenda Chromanfor an abn CXR>      Katrina Campbell an ex-smoker, having started in her teens and smoked for ~45 yrs up to 2ppd, for an est 639pack-yr smoking hx (she quit in 2014 because her fingers were purple);  She carries the Dx of underlying COPD & was placed on Advair250, also tried Incruse x 34mout she stopped this med;  She describes a developing cough, prod of a small amt of green phlegm (no blood), assoc w/ increased SOB and sharp left upper CP, and decr energy "I was past going";  She went to urgent care where a CXR showed a left upper lobe opac "I was treated for pneumonia w/ 2 shots & Levaquin x 10d";  [facility] showing   Smoking Hx>  As above  Pulmonary Hx>  She has a hx of sinusitis w/ surg x2;  She has had occas bouts of bronchitis requiring antibiotics & pneumonia in 2015 treated w/ antibiotic & pred w/ resolution;  No hx asthma, no hx TB or known exposure;  She admits to mild cough, some chest congestion, but denied SOB/DOE before the last 2-3 yrs...  Medical Hx>  HBP (but not requiring meds now), HL (on diet alone), anxiety/ depression (on WellbutrinXL)...Marland KitchenMarland KitchenFamily Hx>  Father was a smoker w/ emphysema & died in his 7037's/ CHF; mother died at 7457/ CHF; all 3 sibs have lung dis- 1Bro died 5072'd/ lung cancer, 1Sis died w/ pneumonia, 1Sis alive w/ COPD...  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  Current Meds>  Advair250, AlbuterolHFA, Mucinex, ASA81, MVIs, WellbutrinXL300... EXAM shows Afeb, VSS, O2sat=98% on RA at rest;  HEENT- neg, mallampati1;  Chest- decr BS at bases, w/o w/r/r;  Heart- RR w/o m/r/g;  Abd- soft, neg;  Ext- w/o c/c/e;  Neuro- intact...   CXR 07/19/12 showed norm heart size, COPD w/ hyperinflation, resolved prev lingular infiltrate- now clear/ NAD...   [facility] at[facility] calcif in Ao & branch vessels including the coronaries, no adenopathy, COPD changes noted, no pulm nodules, min RML atx  LABS 9/16 in Epic>  Chems- wnl;  CBC- wnl;  Norm VitD, B12, & Iron;  TSH=3.82...  [facility], [facility] COPD w/ hyperinflation, 2.6cm density LUL medially- infiltrate vs mass...  CT Chest performed 01/27/15 [facility] showed left apical (medial/post) opacity assoc w/ centrilob emphysema changes & scattered areas of scarring  Spirometry 02/10/15 showed FVC=2.98 (97%), FEV1=1.59 (66%), %1sec=54%, mid-flows reduced at 33% predicted... c/w mod severe airflow obstruction & GOLD Stage2 COPD.  Ambulatory Oxygen saturation test on RA 02/10/15>  O2sat=97% w/ pulse=62/min;  She ambulated 3 laps w/ lowest O2sat=84% w/ pulse=102/min (SOB during  a cough paroxysm)...  IMP/PLAN>>  Katrina Campbell has mod COPD & quit smoking 2014;  She had a lingular pneumonia in 2015 which resolved to baseline after antibiotics and pred rx;  She presented w/ acute LUL symptoms c/w pneumonia and an abn CXR=> slow to resolve after antibiotic rx;  I told her that in my opinion we should give it a little longer to see if this represents XRay lag time- rec to take AUGMENTIN875Bid/Align, continue MUCINEX(2Bid)/ fluids, continue ADVAIR250Bid, and restart the INCRUSE daily;  We plan ROV in 3wks w/ CXR & to consider timing of a repeat CT, consider PET, etc....    ~  March 11, 2015:  23moROV w/ SN>  CKyndallreports that she is breathing sl better- notes sl congestion, sl sore throat, sl cough/ beige phlegm, sl left scapular pain, no f/c/s=> we discussed Katrina Campbell for throat & continue meds + Mucinex '1200mg'$ Bid w/ fluids...     COPD/ Emphysema> on Advair250Bid, Incruse daily, Mucinex 600- 1to2Bid    Hx LUL pneumonia w/ left apical opac on CT 01/2015 slow to clear>     Ex-smoker w/ 65+pack-yr hx>  She quit smoking in 2014...    Hx sinusitis w/ surg x2 in the past EXAM shows Afeb, VSS, O2sat=99% on RA at rest;  HEENT- neg, mallampati1;  Chest- decr BS at bases, sl congested cough, w/o w/r/r;  Heart- RR w/o m/r/g;  Abd- soft, neg;  Ext- w/o c/c/e;  Neuro- intact...   CXR 03/11/15 showed norm heart size, hyperinflation w/ interstitial coarsening, NAD...  IMP/PLAN>>  Katrina Campbell Stage 2 COPD/ emphysema & prev LUL pneumonia w/ CT Chest 01/2015 showing left apical opacity (slow to clear); she has quit smoking (2014), takes Advair250Bid, Incruse daily, Mucinex, and we discussed need for incr exercise program... CXR today w/ COPD/emphysema & left apex looks ok- we decided to wait 261mo recheck CT at that time...   ~  May 12, 2015:  46m27moV & Katrina Campbell some chr congestion, clear sput, & DOE- she continues on Advbair250, Incruse, Proair, Mucinex, Fluids... It is time for her f/u CT Chest...    COPD/ Emphysema> on Advair250Bid, Incruse daily, Proair, Mucinex 600- 1to2Bid; Mod airflow obstruction w/ GOLD Stage2 COPD    Hx LUL pneumonia w/ left apical opac on CT 01/2015 slow to clear> also had lingular pneumonia 2015    Ex-smoker w/ 65+pack-yr hx>  She quit smoking in 2014...    Hx sinusitis w/ surg x2 in the past    Atherosclerotic calcif in Ao, branch vessels, and coronaries on prev CTs...  EXAM shows Afeb, VSS, O2sat=97% on RA at rest;  HEENT- neg, mallampati1;  Chest- decr BS at bases, sl congested cough, w/o w/r/r;  Heart- RR w/o m/r/g;  Abd- soft, neg;  Ext-  w/o c/c/e;  Neuro- intact  CXR 05/12/15 showed norm heart size, hyperinflation/ clear lungs/ NAD...   CT Chest 05/22/15 showed norm heart size, coronary calcif & atherosclerotic calcif of Ao, no signif adenopathy, mild centrilob & pqaraseptal emphysema, prev left apical opac is resolved w/ mild resid scarring, new 4mm51mdule along minor fissure & radiology rec f/u CT in 57yr.49yrMP/PLAN>>  Left apical opac is resolved, new 4mm n66mle in right mid-zone noted; for the cough she will continue the Mucinex and for the Dyspnea- advised again to start exercise program... We plan ROV in 574mo.  48mougust 7, 2017:  574mo ROV31moonnie has retired from [employer] retirement but not exercising 7  we discussed the need for a regular exercise program;  She is concerned about 8# wt gain (her BMI=24) and this provides an ideal situation to encourage an exercise program- we discussed a Gym, the [employer], Yoga, etc... She had some left shoulder pain, saw an orthopedist w/ shot for frozen shoulder which helped some... Her breathing is stable- some DOE w/ exertion, notes occas wheezing, min cough, no sput, etc; she is regular w/ her ADVAIR-250 (2spBid) and Incruse (once daily) she is using Mucinex'1200mg'$ /d and AlbutHFA rescue inhaler as needed (once daily on ave); she thinks that her small dog may be part of her prob indoors and we reviewed having a safe room w/ HEPA filter air cleaner to creat a good environment for her to breathe...  EXAM shows Afeb, VSS, O2sat=98% on RA at rest;  HEENT- neg, mallampati1;  Chest- decr BS at bases, clear w/o w/r/r;  Heart- RR w/o m/r/g;  Abd- soft, neg;  Ext- w/o c/c/e;  Neuro- intact  We reviewed prev CXRs & CT Chest scan IMP/PLAN>>  Katrina Campbell is reassured about her breathing; doing sati off cigarettes for 109yr, taking her inhalers regularly; now needs to incr her exercise program & push the envelope for max improvement- briefly discussed Pulm Rehab but she wants to try it on her own & I  agree; we plan ROV recheck & f/u CT Chest in 654mo. In the meanwhile we reviewed adult vaccine schedule but she is reluctant, says she hasn't taken any vaccines, thinks her sisters vaccination gave her severe pneumonia a few yrs ago; I didn't push the issue but discussed the benefits from Pneumococcal vaccination 7 the flu shots; she will consider these options...     Past Medical History  Diagnosis Date  . Depression >> on WellbutrinXL300   . Anxiety   . COPD (chronic obstructive pulmonary disease) (HCC) >> on Advair250 & Incruse   . Hyperlipidemia   . Labile hypertension >> not current on BP meds      Past Surgical History:  Procedure Laterality Date  . [place]  Outpatient Encounter Prescriptions as of 11/09/15  Medication Sig  . albuterol (PROAIR HFA) 108 (90 BASE) MCG/ACT inhaler Inhale 2 puffs into the lungs every 6 (six) hours as needed for wheezing or shortness of breath.  . Marland Kitchenspirin 81 MG tablet Take 81 mg by mouth daily.  . Marland KitchenuPROPion (WELLBUTRIN XL) 300 MG 24 hr tablet TAKE 1 TABLET DAILY  . calcium carbonate (OS-CAL) 600 MG TABS tablet Take 600 mg by mouth daily with breakfast.  . fluticasone (FLONASE) 50 MCG/ACT nasal spray Place 1 spray into both nostrils 2 (two) times daily as needed for allergies or rhinitis.  . Fluticasone-Salmeterol (ADVAIR DISKUS) 250-50 MCG/DOSE AEPB USE 1 INHALATION TWICE A DAY  . guaiFENesin (MUCINEX) 600 MG 12 hr tablet Take by mouth daily.  . Marland Kitchenbuprofen (ADVIL) 200 MG tablet Take 400 mg by mouth every 8 (eight) hours as needed for fever.  . Multiple Vitamin (MULTIVITAMIN WITH MINERALS) TABS Take 1 tablet by mouth every morning.  . Marland Kitchenmeclidinium Bromide (INCRUSE ELLIPTA) 62.5 MCG/INH AEPB Inhale 1 puff into the lungs daily.  . vitamin C (ASCORBIC ACID) 500 MG tablet Take 500 mg by mouth every morning.    No Known Allergies   Current Medications, Allergies, Past Medical History, Past Surgical History, Family History, and Social  History were reviewed in [employer].   Review of Systems  All symptoms NEG except where BOLDED >>  Constitutional:  F/C/S, fatigue, anorexia, unexpected weight change. HEENT:  HA, visual changes, hearing loss, earache, nasal symptoms, sore throat, mouth sores, hoarseness. Resp:  cough, sputum, hemoptysis; SOB, tightness, wheezing. Cardio:  CP, palpit, DOE, orthopnea, edema. GI:  N/V/D/C, blood in stool; reflux, abd pain, distention, gas. GU:  dysuria, freq, urgency, hematuria, flank pain, voiding difficulty. MS:  joint pain, swelling, tenderness, decr ROM; neck pain, back pain, etc. Neuro:  HA, tremors, seizures, dizziness, syncope, weakness, numbness, gait abn. Skin:  suspicious lesions or skin rash. Heme:  adenopathy, bruising, bleeding. Psyche:  confusion, agitation, sleep disturbance, hallucinations, anxiety, depression suicidal.   Objective:   Physical Exam       Vital Signs:  Reviewed...   General:  WD, WN, 60 y/o WF in NAD; alert & oriented; pleasant & cooperative... HEENT:  [place]/AT; Conjunctiva- pink, Sclera- nonicteric, EOM-wnl, PERRLA, EACs-clear, TMs-wnl; NOSE-clear; THROAT-clear & wnl.  Neck:  Supple w/ fair ROM; no JVD; normal carotid impulses w/o bruits; no thyromegaly or nodules palpated; no lymphadenopathy.  Chest:  Decr BS at bases, clear to P & A x few scat rhonchi & sl congested cough- without wheezes, rales, or signs of consolidation. Heart:  Regular Rhythm; norm S1 & S2 without murmurs, rubs, or gallops detected. Abdomen:  Soft & nontender- no guarding or rebound; normal bowel sounds; no organomegaly or masses palpated. Ext:  Normal ROM; without deformities or arthritic changes; no varicose veins, venous insuffic, or edema;  Pulses intact w/o bruits. Neuro:  No focal neuro deficits; sensory testing normal; gait normal & balance OK. Derm:  No lesions noted; no rash etc. Lymph:  No cervical, supraclavicular, axillary, or  inguinal adenopathy palpated.   Assessment:      IMP >>     COPD>  Mod airlow obstruction w/ GOLD Stage2 COPD; on Advair250, restart Incruse daily + Mucinex, Proair, etc...    Abn CXR/ CTChest>  LUL opacity- r/o pneumonia/ tumor/ XRay lag time => resolved over time    HxPneumonia>  He had prev lingular pneumonia 2015 that responded to antibiotics and pred...    Ex-smoker>  She has ~65 pack-yr hx and quit in 2014...    Atherosclerotic calcif in Ao, branch vessels, and coronaries on prev CTs...   PLAN >>  02/10/15>  Katrina Campbell has mod COPD & quit smoking 2014;  She had a lingular pneumonia in 2015 which resolved to baseline after antibiotics and pred rx;  She presented w/ acute LUL symptoms c/w pneumonia and an abn CXR=> slow to resolve after antibiotic rx;  I told her that in my opinion we should give it a little longer to see if this represents XRay lag time- rec to take AUGMENTIN875Bid/Align, continue MUCINEX(2Bid)/ fluids, continue ADVAIR250Bid, and restart the INCRUSE daily;  We plan ROV in 3wks w/ CXR & to consider timing of a repeat CT, consider PET, etc... 03/11/15>  Katrina Campbell has Stage 2 COPD/ emphysema & prev LUL pneumonia w/ CT Chest 01/2015 showing left apical opacity (slow to clear); she has quit smoking (2014), takes Advair250Bid, Incruse daily, Mucinex, and we discussed need for incr exercise program... CXR today w/ COPD/emphysema & left apex looks ok- we decided to wait 61mo& recheck CT at that time.  05/12/15>  Left apical opac is resolved, new 471mnodule in right mid-zone noted; for the cough she will continue the Mucinex and for the Dyspnea- advised again to start exercise program... We plan ROV in 57m34mo/7/17>  ConMarlowe Campbell  is reassured about her breathing; doing satis off cigarettes for 4yr, taking her inhalers regularly; now needs to incr her exercise program & push the envelope for max improvement- briefly discussed Pulm Rehab but she wants to try it on her own & I agree; we plan ROV recheck &  f/u CT Chest in 693mo.     Plan:     Patient's Medications  New Prescriptions   No medications on file  Previous Medications   ALBUTEROL (PROAIR HFA) 108 (90 BASE) MCG/ACT INHALER    Inhale 2 puffs into the lungs every 6 (six) hours as needed for wheezing or shortness of breath.   ASPIRIN 81 MG TABLET    Take 81 mg by mouth daily.   BUPROPION (WELLBUTRIN XL) 300 MG 24 HR TABLET    TAKE 1 TABLET DAILY   CALCIUM CARBONATE (OS-CAL) 600 MG TABS TABLET    Take 600 mg by mouth daily with breakfast.   FLUTICASONE (FLONASE) 50 MCG/ACT NASAL SPRAY    Place 1 spray into both nostrils 2 (two) times daily as needed for allergies or rhinitis.   FLUTICASONE-SALMETEROL (ADVAIR DISKUS) 250-50 MCG/DOSE AEPB    USE 1 INHALATION TWICE A DAY   GUAIFENESIN (MUCINEX) 600 MG 12 HR TABLET    Take 1,200 mg by mouth 2 (two) times daily.    IBUPROFEN (ADVIL) 200 MG TABLET    Take 400 mg by mouth every 8 (eight) hours as needed for fever.   MULTIPLE VITAMIN (MULTIVITAMIN WITH MINERALS) TABS    Take 1 tablet by mouth every morning.   UMECLIDINIUM BROMIDE (INCRUSE ELLIPTA) 62.5 MCG/INH AEPB    Inhale 1 puff into the lungs daily.   VITAMIN C (ASCORBIC ACID) 500 MG TABLET    Take 500 mg by mouth every morning.  Modified Medications   No medications on file  Discontinued Medications   DIAZEPAM (VALIUM) 5 MG TABLET    Take 1 tablet (5 mg total) by mouth at bedtime as needed for anxiety (spasms).   MELOXICAM (MOBIC) 15 MG TABLET    Take 1 tablet (15 mg total) by mouth daily.

## 2015-11-09 NOTE — Patient Instructions (Signed)
Today we updated your med list in our EPIC system...    Continue your current medications the same...  We discussed diet & EXERCISE program to help your breathing...  Call for any questions or if we can be of service in any way...  Let's plan a follow up visit in 19mo sooner if needed for any breathing problems...    We will plan a follow up CT Chest at that time..Marland KitchenMarland Kitchen

## 2015-11-16 ENCOUNTER — Encounter: Payer: Self-pay | Admitting: *Deleted

## 2015-11-19 ENCOUNTER — Other Ambulatory Visit: Payer: Self-pay | Admitting: Pulmonary Disease

## 2015-12-02 ENCOUNTER — Other Ambulatory Visit: Payer: Self-pay | Admitting: Physician Assistant

## 2015-12-30 ENCOUNTER — Encounter: Payer: Self-pay | Admitting: Physician Assistant

## 2015-12-30 ENCOUNTER — Ambulatory Visit (INDEPENDENT_AMBULATORY_CARE_PROVIDER_SITE_OTHER): Admitting: Physician Assistant

## 2015-12-30 ENCOUNTER — Ambulatory Visit: Payer: Self-pay | Admitting: Physician Assistant

## 2015-12-30 VITALS — BP 122/66 | HR 64 | Temp 97.5°F | Resp 16 | Ht 64.0 in | Wt 138.8 lb

## 2015-12-30 DIAGNOSIS — F329 Major depressive disorder, single episode, unspecified: Secondary | ICD-10-CM

## 2015-12-30 DIAGNOSIS — Z136 Encounter for screening for cardiovascular disorders: Secondary | ICD-10-CM | POA: Diagnosis not present

## 2015-12-30 DIAGNOSIS — Z0001 Encounter for general adult medical examination with abnormal findings: Secondary | ICD-10-CM | POA: Diagnosis not present

## 2015-12-30 DIAGNOSIS — I712 Thoracic aortic aneurysm, without rupture, unspecified: Secondary | ICD-10-CM | POA: Insufficient documentation

## 2015-12-30 DIAGNOSIS — R9389 Abnormal findings on diagnostic imaging of other specified body structures: Secondary | ICD-10-CM

## 2015-12-30 DIAGNOSIS — E559 Vitamin D deficiency, unspecified: Secondary | ICD-10-CM

## 2015-12-30 DIAGNOSIS — I10 Essential (primary) hypertension: Secondary | ICD-10-CM

## 2015-12-30 DIAGNOSIS — R7309 Other abnormal glucose: Secondary | ICD-10-CM | POA: Diagnosis not present

## 2015-12-30 DIAGNOSIS — E785 Hyperlipidemia, unspecified: Secondary | ICD-10-CM | POA: Diagnosis not present

## 2015-12-30 DIAGNOSIS — Z79899 Other long term (current) drug therapy: Secondary | ICD-10-CM

## 2015-12-30 DIAGNOSIS — R6889 Other general symptoms and signs: Secondary | ICD-10-CM

## 2015-12-30 DIAGNOSIS — J432 Centrilobular emphysema: Secondary | ICD-10-CM | POA: Diagnosis not present

## 2015-12-30 DIAGNOSIS — R0989 Other specified symptoms and signs involving the circulatory and respiratory systems: Secondary | ICD-10-CM

## 2015-12-30 DIAGNOSIS — I700 Atherosclerosis of aorta: Secondary | ICD-10-CM | POA: Diagnosis not present

## 2015-12-30 DIAGNOSIS — F32A Depression, unspecified: Secondary | ICD-10-CM

## 2015-12-30 DIAGNOSIS — R938 Abnormal findings on diagnostic imaging of other specified body structures: Secondary | ICD-10-CM | POA: Diagnosis not present

## 2015-12-30 DIAGNOSIS — M8580 Other specified disorders of bone density and structure, unspecified site: Secondary | ICD-10-CM | POA: Insufficient documentation

## 2015-12-30 DIAGNOSIS — F419 Anxiety disorder, unspecified: Secondary | ICD-10-CM

## 2015-12-30 LAB — CBC WITH DIFFERENTIAL/PLATELET
Basophils Absolute: 0 cells/uL (ref 0–200)
Basophils Relative: 0 %
Eosinophils Absolute: 132 cells/uL (ref 15–500)
Eosinophils Relative: 2 %
HCT: 41.5 % (ref 35.0–45.0)
Hemoglobin: 13.8 g/dL (ref 11.7–15.5)
Lymphocytes Relative: 38 %
Lymphs Abs: 2508 cells/uL (ref 850–3900)
MCH: 30.1 pg (ref 27.0–33.0)
MCHC: 33.3 g/dL (ref 32.0–36.0)
MCV: 90.6 fL (ref 80.0–100.0)
MPV: 11.6 fL (ref 7.5–12.5)
Monocytes Absolute: 528 cells/uL (ref 200–950)
Monocytes Relative: 8 %
Neutro Abs: 3432 cells/uL (ref 1500–7800)
Neutrophils Relative %: 52 %
Platelets: 158 10*3/uL (ref 140–400)
RBC: 4.58 MIL/uL (ref 3.80–5.10)
RDW: 13.9 % (ref 11.0–15.0)
WBC: 6.6 10*3/uL (ref 3.8–10.8)

## 2015-12-30 MED ORDER — FLUTICASONE-SALMETEROL 250-50 MCG/DOSE IN AEPB: INHALATION_SPRAY | RESPIRATORY_TRACT | 1 refills | Status: DC

## 2015-12-30 NOTE — Progress Notes (Signed)
Complete Physical   Assessment and Plan: Labile hypertension - continue medications, DASH diet, exercise and monitor at home. Call if greater than 130/80.  - TSH - Urinalysis, Routine w reflex microscopic (not at [facility]) - Microalbumin / creatinine urine ratio - EKG 12-Lead - [place], RETROPERITNL ABD,  LTD  Chronic obstructive pulmonary disease, unspecified COPD, unspecified chronic bronchitis type  will get CXR, continue meds.   Hyperlipidemia -continue medications, check lipids, decrease fatty foods, increase activity.  - Lipid panel  Depression, remission Depression- continue medications, can try to decrease/stop wellbutrin in the fall, stress management techniques discussed, increase water, good sleep hygiene discussed, increase exercise, and increase veggies.   Anxiety  continue medications, stress management techniques discussed, increase water, good sleep hygiene discussed, increase exercise, and increase veggies.   Medication management - Magnesium  Vitamin D deficiency - Vit D  25 hydroxy (rtn osteoporosis monitoring)  Abnormal CT of the chest Repeat CT chest 05/2016  Thoracic aortic aneurysm without rupture (HCC) Control blood pressure, cholesterol, glucose, increase exercise.   Atherosclerosis of aorta (HCC) Control blood pressure, cholesterol, glucose, increase exercise.     Discussed med's effects and SE's. Screening labs and tests as requested with regular follow-up as recommended.   HPI 60 y.o. female  presents for a complete physical. Her blood pressure has been controlled at home, today their BP is BP: 122/66 She does not workout, fractured ankle, but just got treadmill and wants to start working out.  She denies chest pain, dizziness.  Lab Results  Component Value Date   HGBA1C 5.0 07/17/2013   She is not on cholesterol medication. Her cholesterol is at goal. The cholesterol last visit was:   Lab Results  Component Value Date   CHOL 185 12/29/2014    HDL 68 12/29/2014   LDLCALC 86 12/29/2014   TRIG 157 (H) 12/29/2014   CHOLHDL 2.7 12/29/2014   Patient is on Vitamin D supplement.   Lab Results  Component Value Date   VD25OH 46 12/29/2014   She denies swelling at the end of her shift in her feet however she states that she has back and leg pain in the morning with some stiffness. Takes Advil occ helps. She has COPD, last CT chest was 05/2015, follows with Dr. Lenna Gilford. She has She has stopped smoking since Aug 2nd 2014.  Current Medications:  Current Outpatient Prescriptions on File Prior to Visit  Medication Sig Dispense Refill  . albuterol (PROAIR HFA) 108 (90 BASE) MCG/ACT inhaler Inhale 2 puffs into the lungs every 6 (six) hours as needed for wheezing or shortness of breath. 18 g 3  . aspirin 81 MG tablet Take 81 mg by mouth daily.    Marland Kitchen buPROPion (WELLBUTRIN XL) 300 MG 24 hr tablet TAKE 1 TABLET DAILY 90 tablet 0  . calcium carbonate (OS-CAL) 600 MG TABS tablet Take 600 mg by mouth daily with breakfast.    . fluticasone (FLONASE) 50 MCG/ACT nasal spray Place 1 spray into both nostrils 2 (two) times daily as needed for allergies or rhinitis. 16 g 3  . Fluticasone-Salmeterol (ADVAIR DISKUS) 250-50 MCG/DOSE AEPB USE 1 INHALATION TWICE A DAY (Patient taking differently: 2 puffs, twice daily) 180 each 1  . guaiFENesin (MUCINEX) 600 MG 12 hr tablet Take 1,200 mg by mouth 2 (two) times daily.     Marland Kitchen ibuprofen (ADVIL) 200 MG tablet Take 400 mg by mouth every 8 (eight) hours as needed for fever.    . INCRUSE ELLIPTA 62.5 MCG/INH AEPB USE  1 INHALATION DAILY (DISCARD 6 WEEKS AFTER OPENING FOIL TRAY) 90 each 1  . Multiple Vitamin (MULTIVITAMIN WITH MINERALS) TABS Take 1 tablet by mouth every morning.    . vitamin C (ASCORBIC ACID) 500 MG tablet Take 500 mg by mouth every morning.     No current facility-administered medications on file prior to visit.    Health Maintenance:   Tetanus:declines Pneumovax: declines Prevnar 13: declines Flu  vaccine: declines Zostavax: declines  Pap: 2015,  1 abnormal 40 years ago, will not get another MGM: 07/2015 DEXA: 2016 Colonoscopy: 2015 EGD: N/A CT Chest: 05/2015, need repeat 1 year for nodule  Patient Care Team: Unk Pinto, MD as PCP - General (Internal Medicine) Druscilla Brownie, MD as Consulting Physician (Dermatology) Noralee Space, MD as Consulting Physician (Pulmonary Disease)   Medical History:  Past Medical History:  Diagnosis Date  . Anxiety   . COPD (chronic obstructive pulmonary disease) ([place])   . Depression   . Hyperlipidemia   . Labile hypertension    Allergies No Known Allergies  SURGICAL HISTORY She  has a past surgical history that includes Nasal sinus surgery (1986). FAMILY HISTORY Her family history includes Asthma in her other; Emphysema in her mother; Heart attack in her father and mother. SOCIAL HISTORY She  reports that she quit smoking about 3 years ago. Her smoking use included Cigarettes. She has a 40.00 pack-year smoking history. She uses smokeless tobacco. She reports that she drinks about 25.2 oz of alcohol per week . She reports that she does not use drugs.   Review of Systems  Constitutional: Negative.   HENT: Negative.   Eyes: Negative.   Respiratory: Negative.   Cardiovascular: Negative.   Gastrointestinal: Negative.   Genitourinary: Negative.   Musculoskeletal: Negative.   Skin: Negative.   Neurological: Negative.   Endo/Heme/Allergies: Negative.   Psychiatric/Behavioral: Negative.     Physical Exam: Estimated body mass index is 23.82 kg/m as calculated from the following:   Height as of this encounter: '5\' 4"'$  (1.626 m).   Weight as of this encounter: 138 lb 12.8 oz (63 kg). BP 122/66   Pulse 64   Temp 97.5 F (36.4 C)   Resp 16   Ht '5\' 4"'$  (1.626 m)   Wt 138 lb 12.8 oz (63 kg)   SpO2 98%   BMI 23.82 kg/m  General Appearance: Well nourished, in no apparent distress. Eyes: PERRLA, EOMs, conjunctiva no swelling or  erythema, normal fundi and vessels. Sinuses: No Frontal/maxillary tenderness ENT/Mouth: Ext aud canals clear, normal light reflex with TMs without erythema, bulging.  Good dentition. No erythema, swelling, or exudate on post pharynx. Tonsils not swollen or erythematous. Hearing normal.  Neck: Supple, thyroid normal. No bruits Respiratory: Respiratory effort normal, , wheezing left sided, BS equal bilaterally without rales, rhonchi, or stridor. Cardio: RRR without murmurs, rubs or gallops. Brisk peripheral pulses without edema.  Chest: symmetric, with normal excursions and percussion. Breasts: Symmetric, without lumps, nipple discharge, retractions. Abdomen: Soft, +BS. Non tender, no guarding, rebound, hernias, masses, or organomegaly.  Lymphatics: Non tender without lymphadenopathy.  Musculoskeletal: Full ROM all peripheral extremities,5/5 strength, and normal gait. Skin: Warm, dry without rashes, lesions, ecchymosis.  Neuro: Cranial nerves intact, reflexes equal bilaterally. Normal muscle tone, no cerebellar symptoms. Sensation intact.  Psych: Awake and oriented X 3, normal affect, Insight and Judgment appropriate.   EKG: WNL no changes. Aorta Scan: defer  [name] 3:18 PM

## 2015-12-30 NOTE — Patient Instructions (Signed)
Wait until spring, but can cut wellbturin in half x [id number] try to get off of it  Chronic Obstructive Pulmonary Disease Chronic obstructive pulmonary disease (COPD) is a common lung condition in which airflow from the lungs is limited. COPD is a general term that can be used to describe many different lung problems that limit airflow, including both chronic bronchitis and emphysema. If you have COPD, your lung function will probably never return to normal, but there are measures you can take to improve lung function and make yourself feel better. CAUSES   Smoking (common).  Exposure to secondhand smoke.  Genetic problems.  Chronic inflammatory lung diseases or recurrent infections. SYMPTOMS  Shortness of breath, especially with physical activity.  Deep, persistent (chronic) cough with a large amount of thick mucus.  Wheezing.  Rapid breaths (tachypnea).  Gray or bluish discoloration (cyanosis) of the skin, especially in your fingers, toes, or lips.  Fatigue.  Weight loss.  Frequent infections or episodes when breathing symptoms become much worse (exacerbations).  Chest tightness. DIAGNOSIS Your health care provider will take a medical history and perform a physical examination to diagnose COPD. Additional tests for COPD may include:  Lung (pulmonary) function tests.  Chest X-ray.  CT scan.  Blood tests. TREATMENT  Treatment for COPD may include:  Inhaler and nebulizer medicines. These help manage the symptoms of COPD and make your breathing more comfortable.  Supplemental oxygen. Supplemental oxygen is only helpful if you have a low oxygen level in your blood.  Exercise and physical activity. These are beneficial for nearly all people with COPD.  Lung surgery or transplant.  Nutrition therapy to gain weight, if you are underweight.  Pulmonary rehabilitation. This may involve working with a team of health care providers and specialists, such as respiratory,  occupational, and physical therapists. HOME CARE INSTRUCTIONS  Take all medicines (inhaled or pills) as directed by your health care provider.  Avoid over-the-counter medicines or cough syrups that dry up your airway (such as antihistamines) and slow down the elimination of secretions unless instructed otherwise by your health care provider.  If you are a smoker, the most important thing that you can do is stop smoking. Continuing to smoke will cause further lung damage and breathing trouble. Ask your health care provider for help with quitting smoking. He or she can direct you to community resources or hospitals that provide support.  Avoid exposure to irritants such as smoke, chemicals, and fumes that aggravate your breathing.  Use oxygen therapy and pulmonary rehabilitation if directed by your health care provider. If you require home oxygen therapy, ask your health care provider whether you should purchase a pulse oximeter to measure your oxygen level at home.  Avoid contact with individuals who have a contagious illness.  Avoid extreme temperature and humidity changes.  Eat healthy foods. Eating smaller, more frequent meals and resting before meals may help you maintain your strength.  Stay active, but balance activity with periods of rest. Exercise and physical activity will help you maintain your ability to do things you want to do.  Preventing infection and hospitalization is very important when you have COPD. Make sure to receive all the vaccines your health care provider recommends, especially the pneumococcal and influenza vaccines. Ask your health care provider whether you need a pneumonia vaccine.  Learn and use relaxation techniques to manage stress.  Learn and use controlled breathing techniques as directed by your health care provider. Controlled breathing techniques include:  Pursed lip breathing.  Start by breathing in (inhaling) through your nose for 1 second. Then, purse  your lips as if you were going to whistle and breathe out (exhale) through the pursed lips for 2 seconds.  Diaphragmatic breathing. Start by putting one hand on your abdomen just above your waist. Inhale slowly through your nose. The hand on your abdomen should move out. Then purse your lips and exhale slowly. You should be able to feel the hand on your abdomen moving in as you exhale.  Learn and use controlled coughing to clear mucus from your lungs. Controlled coughing is a series of short, progressive coughs. The steps of controlled coughing are: 1. Lean your head slightly forward. 2. Breathe in deeply using diaphragmatic breathing. 3. Try to hold your breath for 3 seconds. 4. Keep your mouth slightly open while coughing twice. 5. Spit any mucus out into a tissue. 6. Rest and repeat the steps once or twice as needed. SEEK MEDICAL CARE IF:  You are coughing up more mucus than usual.  There is a change in the color or thickness of your mucus.  Your breathing is more labored than usual.  Your breathing is faster than usual. SEEK IMMEDIATE MEDICAL CARE IF:  You have shortness of breath while you are resting.  You have shortness of breath that prevents you from:  Being able to talk.  Performing your usual physical activities.  You have chest pain lasting longer than 5 minutes.  Your skin color is more cyanotic than usual.  You measure low oxygen saturations for longer than 5 minutes with a pulse oximeter. MAKE SURE YOU:  Understand these instructions.  Will watch your condition.  Will get help right away if you are not doing well or get worse.   This information is not intended to replace advice given to you by your health care provider. Make sure you discuss any questions you have with your health care provider.   Document Released: 12/29/2004 Document Revised: 04/11/2014 Document Reviewed: 11/15/2012 [employer] Interactive Patient Education [employer].

## 2015-12-31 LAB — HEPATIC FUNCTION PANEL
ALT: 22 U/L (ref 6–29)
AST: 27 U/L (ref 10–35)
Albumin: 4.3 g/dL (ref 3.6–5.1)
Alkaline Phosphatase: 68 U/L (ref 33–130)
BILIRUBIN DIRECT: 0.1 mg/dL (ref <=0.2)
BILIRUBIN INDIRECT: 0.4 mg/dL (ref 0.2–1.2)
Total Bilirubin: 0.5 mg/dL (ref 0.2–1.2)
Total Protein: 6.4 g/dL (ref 6.1–8.1)

## 2015-12-31 LAB — URINALYSIS, ROUTINE W REFLEX MICROSCOPIC
BILIRUBIN URINE: NEGATIVE
GLUCOSE, UA: NEGATIVE
Hgb urine dipstick: NEGATIVE
Ketones, ur: NEGATIVE
Leukocytes, UA: NEGATIVE
Nitrite: NEGATIVE
PROTEIN: NEGATIVE
Specific Gravity, Urine: 1.017 (ref 1.001–1.035)
pH: 7 (ref 5.0–8.0)

## 2015-12-31 LAB — BASIC METABOLIC PANEL WITH GFR
BUN: 14 mg/dL (ref 7–25)
CALCIUM: 9.4 mg/dL (ref 8.6–10.4)
CHLORIDE: 106 mmol/L (ref 98–110)
CO2: 25 mmol/L (ref 20–31)
Creat: 0.76 mg/dL (ref 0.50–0.99)
GFR, EST NON AFRICAN AMERICAN: 84 mL/min (ref >=60)
GFR, Est African American: >89 mL/min (ref >=60)
Glucose, Bld: 85 mg/dL (ref 65–99)
POTASSIUM: 4 mmol/L (ref 3.5–5.3)
SODIUM: 144 mmol/L (ref 135–146)

## 2015-12-31 LAB — MICROALBUMIN / CREATININE URINE RATIO
Creatinine, Urine: 78 mg/dL (ref 20–320)
Microalb Creat Ratio: 3 mcg/mg creat (ref <30)
Microalb, Ur: 0.2 mg/dL

## 2015-12-31 LAB — LIPID PANEL
CHOL/HDL RATIO: 3.4 ratio (ref <=5.0)
CHOLESTEROL: 243 mg/dL — AB (ref 125–200)
HDL: 72 mg/dL (ref >=46)
LDL Cholesterol: 133 mg/dL — ABNORMAL HIGH (ref <130)
Triglycerides: 191 mg/dL — ABNORMAL HIGH (ref <150)
VLDL: 38 mg/dL — AB (ref <30)

## 2015-12-31 LAB — MAGNESIUM: Magnesium: 2.1 mg/dL (ref 1.5–2.5)

## 2015-12-31 LAB — TSH: TSH: 3.08 mIU/L

## 2015-12-31 LAB — VITAMIN D 25 HYDROXY (VIT D DEFICIENCY, FRACTURES): VIT D 25 HYDROXY: 55 ng/mL (ref 30–100)

## 2016-03-01 ENCOUNTER — Other Ambulatory Visit: Payer: Self-pay | Admitting: Internal Medicine

## 2016-03-05 ENCOUNTER — Encounter: Payer: Self-pay | Admitting: *Deleted

## 2016-04-04 DIAGNOSIS — I219 Acute myocardial infarction, unspecified: Secondary | ICD-10-CM

## 2016-04-04 HISTORY — DX: Acute myocardial infarction, unspecified: I21.9

## 2016-05-04 ENCOUNTER — Other Ambulatory Visit: Payer: Self-pay | Admitting: Pulmonary Disease

## 2016-05-11 ENCOUNTER — Ambulatory Visit: Admitting: Pulmonary Disease

## 2016-05-19 ENCOUNTER — Encounter: Payer: Self-pay | Admitting: Pulmonary Disease

## 2016-05-19 ENCOUNTER — Ambulatory Visit (INDEPENDENT_AMBULATORY_CARE_PROVIDER_SITE_OTHER): Admitting: Pulmonary Disease

## 2016-05-19 VITALS — BP 128/82 | HR 76 | Temp 97.9°F | Ht 64.0 in | Wt 137.0 lb

## 2016-05-19 DIAGNOSIS — I10 Essential (primary) hypertension: Secondary | ICD-10-CM

## 2016-05-19 DIAGNOSIS — J432 Centrilobular emphysema: Secondary | ICD-10-CM

## 2016-05-19 DIAGNOSIS — R938 Abnormal findings on diagnostic imaging of other specified body structures: Secondary | ICD-10-CM

## 2016-05-19 DIAGNOSIS — F419 Anxiety disorder, unspecified: Secondary | ICD-10-CM | POA: Diagnosis not present

## 2016-05-19 DIAGNOSIS — I700 Atherosclerosis of aorta: Secondary | ICD-10-CM

## 2016-05-19 DIAGNOSIS — R9389 Abnormal findings on diagnostic imaging of other specified body structures: Secondary | ICD-10-CM

## 2016-05-19 DIAGNOSIS — R0989 Other specified symptoms and signs involving the circulatory and respiratory systems: Secondary | ICD-10-CM

## 2016-05-19 MED ORDER — CLONAZEPAM 0.5 MG PO TABS: ORAL_TABLET | ORAL | 5 refills | Status: DC

## 2016-05-19 NOTE — Progress Notes (Signed)
Subjective:     Patient ID: Katrina Campbell, female   DOB: 09-Apr-1955, 61 y.o.   MRN: [id number]  HPI   61 y/o WF, former smoker who quit in 2014, w/ COPD/Emphysema (GOLD Stage 2), Hx LUL pneumonia 10/16 that was slow to clear, f/u CT Chest 2/17 w/ resolution of LUL changes (scarring) but coronary & Aortic atherosclerotic calcif, mild centrilob & pqaraseptal emphysema, & a new 47m nodule along minor fissure => needs f/u...  ~  February 10, 2015:  Initial pulmonary consult by SN>  61y/o WF, referred by [name]& Dr. BGlenda Chromanfor an abn CXR>      CKeciais an ex-smoker, having started in her teens and smoked for ~45 yrs up to 2ppd, for an est 650pack-yr smoking hx (she quit in 2014 because her fingers were purple);  She carries the Dx of underlying COPD & was placed on Advair250, also tried Incruse x 338mout she stopped this med;  She describes a developing cough, prod of a small amt of green phlegm (no blood), assoc w/ increased SOB and sharp left upper CP, and decr energy "I was past going";  She went to urgent care where a CXR showed a left upper lobe opac "I was treated for pneumonia w/ 2 shots & Levaquin x 10d";  [facility] (see below).  Smoking Hx>  As above  Pulmonary Hx>  She has a hx of sinusitis w/ surg x2;  She has had occas bouts of bronchitis requiring antibiotics & pneumonia in 2015 treated w/ antibiotic & pred w/ resolution;  No hx asthma, no hx TB or known exposure;  She admits to mild cough, some chest congestion, but denied SOB/DOE before the last 2-3 yrs...  Medical Hx>  HBP (but not requiring meds now), HL (on diet alone), anxiety/ depression (on WellbutrinXL)...Marland KitchenMarland KitchenFamily Hx>  Father was a smoker w/ emphysema & died in his 7016's/ CHF; mother died at 7421/ CHF; all 3 sibs have lung dis- 1Bro died 5043'd/ lung cancer, 1Sis died w/ pneumonia, 1Sis alive w/ COPD...  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  Current Meds>  Advair250, AlbuterolHFA, Mucinex, ASA81, MVIs, WellbutrinXL300... EXAM shows Afeb, VSS, O2sat=98% on RA at rest;  HEENT- neg, mallampati1;  Chest- decr BS at bases, w/o w/r/r;  Heart- RR w/o m/r/g;  Abd- soft, neg;  Ext- w/o c/c/e;  Neuro- intact...   CXR 07/19/12 showed norm heart size, COPD w/ hyperinflation, resolved prev lingular infiltrate- now clear/ NAD...   [facility] athersclerotic calcif in Ao & branch vessels including the coronaries, no adenopathy, COPD changes noted, no pulm nodules, min RML atx  LABS 9/16 in Epic>  Chems- wnl;  CBC- wnl;  Norm VitD, B12, & Iron;  TSH=3.82...  [facility], [facility] COPD w/ hyperinflation, 2.6cm density LUL medially- infiltrate vs mass...  CT Chest performed 01/27/15 [facility] showed left apical (medial/post) opacity assoc w/ centrilob emphysema changes & scattered areas of scarring  Spirometry 02/10/15 showed FVC=2.98 (97%), FEV1=1.59 (66%), %1sec=54%, mid-flows reduced at 33% predicted... c/w mod severe airflow obstruction & GOLD Stage2 COPD.  Ambulatory Oxygen saturation test on RA 02/10/15>  O2sat=97% w/ pulse=62/min;  She ambulated 3 laps w/ lowest O2sat=84% w/ pulse=102/min (SOB during  a cough paroxysm)...  IMP/PLAN>>  [name] has mod COPD & quit smoking 2014;  She had a lingular pneumonia in 2015 which resolved to baseline after antibiotics and pred rx;  She presented w/ acute LUL symptoms c/w pneumonia and an abn CXR=> slow to resolve after antibiotic rx;  I told her that in my opinion we should give it a little longer to see if this represents XRay lag time- rec to take AUGMENTIN875Bid/Align, continue MUCINEX(2Bid)/ fluids, continue ADVAIR250Bid, and restart the INCRUSE daily;  We plan ROV in 3wks w/ CXR & to consider timing of a repeat CT, consider PET,  etc....   ~  March 11, 2015:  41moROV w/ SN>  CCaileyreports that she is breathing sl better- notes sl congestion, sl sore throat, sl cough/ beige phlegm, sl left scapular pain, no f/c/s=> we discussed DukesMMW for throat & continue meds + Mucinex '1200mg'$ Bid w/ fluids...     COPD/ Emphysema> on Advair250Bid, Incruse daily, Mucinex 600- 1to2Bid    Hx LUL pneumonia w/ left apical opac on CT 01/2015 slow to clear>     Ex-smoker w/ 65+pack-yr hx>  She quit smoking in 2014...    Hx sinusitis w/ surg x2 in the past EXAM shows Afeb, VSS, O2sat=99% on RA at rest;  HEENT- neg, mallampati1;  Chest- decr BS at bases, sl congested cough, w/o w/r/r;  Heart- RR w/o m/r/g;  Abd- soft, neg;  Ext- w/o c/c/e;  Neuro- intact...   CXR 03/11/15 showed norm heart size, hyperinflation w/ interstitial coarsening, NAD...  IMP/PLAN>>  CNarissahas Stage 2 COPD/ emphysema & prev LUL pneumonia w/ CT Chest 01/2015 showing left apical opacity (slow to clear); she has quit smoking (2014), takes Advair250Bid, Incruse daily, Mucinex, and we discussed need for incr exercise program... CXR today w/ COPD/emphysema & left apex looks ok- we decided to wait 2890mo recheck CT at that time...   ~  May 12, 2015:  90m39moV & ConHeilytes some chr congestion, clear sput, & DOE- she continues on Advbair250, Incruse, Proair, Mucinex, Fluids... It is time for her f/u CT Chest...    COPD/ Emphysema> on Advair250Bid, Incruse daily, Proair, Mucinex 600- 1to2Bid; Mod airflow obstruction w/ GOLD Stage2 COPD    Hx LUL pneumonia w/ left apical opac on CT 01/2015 slow to clear> also had lingular pneumonia 2015    Ex-smoker w/ 65+pack-yr hx>  She quit smoking in 2014...    Hx sinusitis w/ surg x2 in the past    Atherosclerotic calcif in Ao, branch vessels, and coronaries on prev CTs...  EXAM shows Afeb, VSS, O2sat=97% on RA at rest;  HEENT- neg, mallampati1;  Chest- decr BS at bases, sl congested cough, w/o w/r/r;  Heart- RR w/o m/r/g;  Abd- soft, neg;   Ext- w/o c/c/e;  Neuro- intact  CXR 05/12/15 showed norm heart size, hyperinflation/ clear lungs/ NAD...   CT Chest 05/22/15 showed norm heart size, coronary calcif & atherosclerotic calcif of Ao, no signif adenopathy, mild centrilob & pqaraseptal emphysema, prev left apical opac is resolved w/ mild resid scarring, new 4mm70mdule along minor fissure & radiology rec f/u CT in 62yr.60yrMP/PLAN>>  Left apical opac is resolved, new 4mm n87mle in right mid-zone noted; for the cough she will continue the Mucinex and for the Dyspnea- advised again to start exercise program... We plan ROV in 47mo.  590mougust 7, 2017:  47mo ROV24moonnie has retired from [employer] retirement but not exercising 7  we discussed the need for a regular exercise program;  She is concerned about 8# wt gain (her BMI=24) and this provides an ideal situation to encourage an exercise program- we discussed a Gym, the [employer], Yoga, etc... She had some left shoulder pain, saw an orthopedist w/ shot for frozen shoulder which helped some... Her breathing is stable- some DOE w/ exertion, notes occas wheezing, min cough, no sput, etc; she is regular w/ her ADVAIR-250 (2spBid) and Incruse (once daily) she is using Mucinex'1200mg'$ /d and AlbutHFA rescue inhaler as needed (once daily on ave); she thinks that her small dog may be part of her prob indoors and we reviewed having a safe room w/ HEPA filter air cleaner to creat a good environment for her to breathe...  EXAM shows Afeb, VSS, O2sat=98% on RA at rest;  HEENT- neg, mallampati1;  Chest- decr BS at bases, clear w/o w/r/r;  Heart- RR w/o m/r/g;  Abd- soft, neg;  Ext- w/o c/c/e;  Neuro- intact  We reviewed prev CXRs & CT Chest scan IMP/PLAN>>  Wallace is reassured about her breathing; doing satis off cigarettes for 63yr, taking her inhalers regularly; now needs to incr her exercise program & push the envelope for max improvement- briefly discussed Pulm Rehab but she wants to try it on her  own & I agree; we plan ROV recheck & f/u CT Chest in 669mo. In the meanwhile we reviewed adult vaccine schedule but she is reluctant, says she hasn't taken any vaccines, thinks her sisters vaccination gave her severe pneumonia a few yrs ago; I didn't push the issue but discussed the benefits from Pneumococcal vaccination 7 the flu shots; she will consider these options...    ~  May 19, 2016:  70m64moV & Dynasty shared the tragic news that her 32 88o daughter (who lived w/ her) overdosed 2 wks ago & passed away ([place]); ConTomia understandably tearful, can't sleep, notes breathing is worse w/ incr use of her rescue inhaler (and little benefit)... We discussed trial of Klonopin for the dyspnea and anxiety, cautioned not to fall back on old habits and stay away from cigs...     COPD/ Emphysema> on Advair250Bid, Incruse daily, Mucinex 600- 1to2Bid; Proair rescue & Flonase prn; CT Chest showed centrilob & paraseptal emphysema and PFTs showed GOLD Stage 2 disease...    Hx LUL pneumonia w/ left apical opac on CT 01/2015 slow to clear> f/u CT Chest 2/17 showed resolution of LUL changes    4mm24mlm nodule on CT Chest 05/2015> located along the minor fissure & f/u due 43yr 27yr/u CT Chest 05/2016 w/ resolution of the nodule    Ex-smoker w/ 65+pack-yr hx>  She quit smoking in 2014...    Hx sinusitis w/ surg x2 in the past    Cardiac Issues> on ASA81; coronary & aortic calcif seen on CT Chest; prev hx of HBP (not on meds now) => CT Chest 05/2016 w/ Asc Ao measure of 4cm, +cardiac risk factors therefore referred to Cardiology to establish.    Medical issues> PCP is DrMcKeown on WellbutrinXL300 to help depression EXAM shows Afeb, VSS, O2sat=97% on RA at rest;  HEENT- neg, mallampati1;  Chest- decr BS at bases, clear w/o w/r/r;  Heart- RR w/o m/r/g;  Abd- soft, neg;  Ext- w/o c/c/e;  Neuro- intact  CT Chest => pending IMP/PLAN>>  Alonia's family has suffered a terrible event & she has  developed incr dyspnea as a result;  We discussed the benefit & roll of Klonopin in this setting & she will try 0.'5mg'$  1/2 to 1 tab Bid;  She is due for her f/u CT Chest & we will plan brief recheck in 6-8 wks... Note: >50% of this 31mn ov was spent in counseling & coordination of care...  ADDENDUM>>  CT CHEST 05/31/16 showed mod emphysema, no adenopathy, and the prev nodule along the right minor fissure has resolved;  Other CT findings include atherosclerotic changes in Ao & coronaries, and some dilatation of ascending Ao measuring ~4cm...  She has several risk factors for CAD w/ prev smoking & elev chol;  EKGs reviewed w/ poor R progression from V1=>3...  REC Cardiac eval to establish & follow going forward => [facility]...    Past Medical History  Diagnosis Date  . Depression >> on WellbutrinXL300   . Anxiety   . COPD (chronic obstructive pulmonary disease) (HCC) >> on Advair250 & Incruse   . Hyperlipidemia   . Labile hypertension >> not current on BP meds      Past Surgical History:  [place]  [place]   Outpatient Encounter Prescriptions as of 05/19/2016  Medication Sig  . albuterol (PROAIR HFA) 108 (90 BASE) MCG/ACT inhaler Inhale 2 puffs into the lungs every 6 (six) hours as needed for wheezing or shortness of breath.  .Marland Kitchenaspirin 81 MG tablet Take 81 mg by mouth daily.  .Marland KitchenbuPROPion (WELLBUTRIN XL) 300 MG 24 hr tablet TAKE 1 TABLET DAILY  . calcium carbonate (OS-CAL) 600 MG TABS tablet Take 600 mg by mouth daily with breakfast.  . fluticasone (FLONASE) 50 MCG/ACT nasal spray Place 1 spray into both nostrils 2 (two) times daily as needed for allergies or rhinitis.  . Fluticasone-Salmeterol (ADVAIR DISKUS) 250-50 MCG/DOSE AEPB 2 puffs, twice daily  . guaiFENesin (MUCINEX) 600 MG 12 hr tablet Take 1,200 mg by mouth 2 (two) times daily.   .Marland Kitchenibuprofen (ADVIL) 200 MG tablet Take 400 mg by mouth every 8 (eight) hours as needed for fever.   . INCRUSE ELLIPTA 62.5 MCG/INH AEPB USE 1 INHALATION DAILY (DISCARD 6 WEEKS AFTER OPENING FOIL TRAY)  . Multiple Vitamin (MULTIVITAMIN WITH MINERALS) TABS Take 1 tablet by mouth every morning.  . vitamin C (ASCORBIC ACID) 500 MG tablet Take 500 mg by mouth every morning.  . clonazePAM (KLONOPIN) 0.5 MG tablet Take 1/2 tablet by mouth two times daily   No facility-administered encounter medications on file as of 05/19/2016.     No Known Allergies   Current Medications, Allergies, Past Medical History, Past Surgical History, Family History, and Social History were reviewed in [employer]record.   Review of Systems             All symptoms NEG except where BOLDED >>  Constitutional:  F/C/S, fatigue, anorexia, unexpected weight change. HEENT:  HA, visual changes, hearing loss, earache, nasal symptoms, sore throat, mouth sores, hoarseness. Resp:  cough, sputum, hemoptysis; SOB, tightness, wheezing. Cardio:  CP, palpit, DOE, orthopnea, edema. GI:  N/V/D/C, blood in stool; reflux, abd pain, distention, gas. GU:  dysuria, freq, urgency, hematuria, flank pain, voiding difficulty. MS:  joint pain, swelling, tenderness, decr ROM; neck pain, back pain, etc. Neuro:  HA, tremors, seizures, dizziness, syncope, weakness, numbness, gait abn. Skin:  suspicious lesions or skin rash. Heme:  adenopathy, bruising, bleeding. Psyche:  confusion, agitation, sleep disturbance, hallucinations, anxiety, depression suicidal.   Objective:   Physical  Exam       Vital Signs:  Reviewed...   General:  WD, WN, 61 y/o WF in NAD; alert & oriented; pleasant & cooperative... HEENT:  [place]/AT; Conjunctiva- pink, Sclera- nonicteric, EOM-wnl, PERRLA, EACs-clear, TMs-wnl; NOSE-clear; THROAT-clear & wnl.  Neck:  Supple w/ fair ROM; no JVD; normal carotid impulses w/o bruits; no thyromegaly or nodules palpated; no lymphadenopathy.  Chest:  Decr BS at bases, clear to P & A x few scat rhonchi & sl  congested cough- without wheezes, rales, or signs of consolidation. Heart:  Regular Rhythm; norm S1 & S2 without murmurs, rubs, or gallops detected. Abdomen:  Soft & nontender- no guarding or rebound; normal bowel sounds; no organomegaly or masses palpated. Ext:  Normal ROM; without deformities or arthritic changes; no varicose veins, venous insuffic, or edema;  Pulses intact w/o bruits. Neuro:  No focal neuro deficits; sensory testing normal; gait normal & balance OK. Derm:  No lesions noted; no rash etc. Lymph:  No cervical, supraclavicular, axillary, or inguinal adenopathy palpated.   Assessment:      IMP >>     COPD/ Emphysema> on Advair250Bid, Incruse daily, Mucinex 600- 1to2Bid; Proair rescue & Flonase prn; CT Chest showed centrilob & paraseptal emphysema and PFTs showed GOLD Stage 2 disease...    Hx LUL pneumonia w/ left apical opac on CT 01/2015 slow to clear> f/u CT Chest 2/17 showed resolution of LUL changes    74m pulm nodule on CT Chest 05/2015> located along the minor fissure & f/u due 158yr> f/u CT Chest 05/2016 w/ resolution of the nodule    Ex-smoker w/ 65+pack-yr hx>  She quit smoking in 2014...    Hx sinusitis w/ surg x2 in the past    Cardiac Issues> on ASA81; coronary & aortic calcif seen on CT Chest; prev hx of HBP (not on meds now) => CT Chest 05/2016 w/ Asc Ao measure of 4cm, +cardiac risk factors therefore referred to Cardiology to establish.    Medical issues> PCP is DrMcKeown on WellbutrinXL300 to help depression.. Marland Kitchen PLAN >>  02/10/15>  CoJilliannaas mod COPD & quit smoking 2014;  She had a lingular pneumonia in 2015 which resolved to baseline after antibiotics and pred rx;  She presented w/ acute LUL symptoms c/w pneumonia and an abn CXR=> slow to resolve after antibiotic rx;  I told her that in my opinion we should give it a little longer to see if this represents XRay lag time- rec to take AUGMENTIN875Bid/Align, continue MUCINEX(2Bid)/ fluids, continue ADVAIR250Bid, and  restart the INCRUSE daily;  We plan ROV in 3wks w/ CXR & to consider timing of a repeat CT, consider PET, etc... 03/11/15>  CoEstephanieas Stage 2 COPD/ emphysema & prev LUL pneumonia w/ CT Chest 01/2015 showing left apical opacity (slow to clear); she has quit smoking (2014), takes Advair250Bid, Incruse daily, Mucinex, and we discussed need for incr exercise program... CXR today w/ COPD/emphysema & left apex looks ok- we decided to wait 92m73morecheck CT at that time.  05/12/15>  Left apical opac is resolved, new 4mm13mdule in right mid-zone noted; for the cough she will continue the Mucinex and for the Dyspnea- advised again to start exercise program... We plan ROV in 23mo.64mo/17>  ConniShenikaeassured about her breathing; doing satis off cigarettes for 71yrs,68yring her inhalers regularly; now needs to incr her exercise program & push the envelope for max improvement- briefly discussed Pulm Rehab but she wants to try it  on her own & I agree; we plan ROV recheck & f/u CT Chest in 18mo.. 05/19/16>   Ashling's family has suffered a terrible event & she has developed incr dyspnea as a result;  We discussed the benefit & roll of Klonopin in this setting & she will try 0.'5mg'$  1/2 to 1 tab Bid;  She is due for her f/u CT Chest & we will plan brief recheck in 6-8 wks     Plan:     Patient's Medications  New Prescriptions   CLONAZEPAM (KLONOPIN) 0.5 MG TABLET    Take 1/2 tablet by mouth two times daily  Previous Medications   ALBUTEROL (PROAIR HFA) 108 (90 BASE) MCG/ACT INHALER    Inhale 2 puffs into the lungs every 6 (six) hours as needed for wheezing or shortness of breath.   ASPIRIN 81 MG TABLET    Take 81 mg by mouth daily.   BUPROPION (WELLBUTRIN XL) 300 MG 24 HR TABLET    TAKE 1 TABLET DAILY   CALCIUM CARBONATE (OS-CAL) 600 MG TABS TABLET    Take 600 mg by mouth daily with breakfast.   FLUTICASONE (FLONASE) 50 MCG/ACT NASAL SPRAY    Place 1 spray into both nostrils 2 (two) times daily as needed for allergies or  rhinitis.   FLUTICASONE-SALMETEROL (ADVAIR DISKUS) 250-50 MCG/DOSE AEPB    2 puffs, twice daily   GUAIFENESIN (MUCINEX) 600 MG 12 HR TABLET    Take 1,200 mg by mouth 2 (two) times daily.    IBUPROFEN (ADVIL) 200 MG TABLET    Take 400 mg by mouth every 8 (eight) hours as needed for fever.   INCRUSE ELLIPTA 62.5 MCG/INH AEPB    USE 1 INHALATION DAILY (DISCARD 6 WEEKS AFTER OPENING FOIL TRAY)   MULTIPLE VITAMIN (MULTIVITAMIN WITH MINERALS) TABS    Take 1 tablet by mouth every morning.   VITAMIN C (ASCORBIC ACID) 500 MG TABLET    Take 500 mg by mouth every morning.  Modified Medications   No medications on file  Discontinued Medications   No medications on file

## 2016-05-19 NOTE — Patient Instructions (Signed)
Today we updated your med list in our EPIC system...    Continue your current medications the same...  We decided to start a combination relaxer to relax the chest wall muscles to improve your shortness of breath...    In this regard-- start the new KLONOPIN 0.'5mg'$  tabs taking 1/2 to 1 tab twice daily on a regular basis...  We will sched the follow up CT Chest to check on that tiny nodule & compare to our 2/17 scan...    We will contact you w/ the results when available...   Katrina Campbell, we are so sorry & saddened by your loss, if there is anything that we can do for you please let us know...  Let's plan a follow up visit in 6-8 weeks, sooner if needed for problems.Marland KitchenMarland Kitchen

## 2016-05-27 ENCOUNTER — Other Ambulatory Visit

## 2016-05-30 ENCOUNTER — Other Ambulatory Visit: Payer: Self-pay | Admitting: Internal Medicine

## 2016-05-31 ENCOUNTER — Ambulatory Visit (INDEPENDENT_AMBULATORY_CARE_PROVIDER_SITE_OTHER)
Admission: RE | Admit: 2016-05-31 | Discharge: 2016-05-31 | Disposition: A | Discharge status: Home / Self Care | Source: Ambulatory Visit | Attending: Pulmonary Disease | Admitting: Pulmonary Disease

## 2016-05-31 DIAGNOSIS — R938 Abnormal findings on diagnostic imaging of other specified body structures: Secondary | ICD-10-CM

## 2016-05-31 DIAGNOSIS — R9389 Abnormal findings on diagnostic imaging of other specified body structures: Secondary | ICD-10-CM

## 2016-06-01 ENCOUNTER — Other Ambulatory Visit: Payer: Self-pay | Admitting: Pulmonary Disease

## 2016-06-01 DIAGNOSIS — R9389 Abnormal findings on diagnostic imaging of other specified body structures: Secondary | ICD-10-CM

## 2016-06-01 DIAGNOSIS — I700 Atherosclerosis of aorta: Secondary | ICD-10-CM

## 2016-06-01 DIAGNOSIS — I712 Thoracic aortic aneurysm, without rupture, unspecified: Secondary | ICD-10-CM

## 2016-06-13 ENCOUNTER — Telehealth: Payer: Self-pay | Admitting: Pulmonary Disease

## 2016-06-13 NOTE — Telephone Encounter (Signed)
Spoke with pt. States that she received a jury summons. Pt is wanting SN to write a letter to excuse her from this. Advised her that we would need the jury summons to be brought to the office. Will await the jury summons.

## 2016-06-13 NOTE — Telephone Encounter (Signed)
Husband (barry) brought jury summons in. Summons placed in SN folder at the checkout area. Contact # [phone].Katrina Campbell

## 2016-06-13 NOTE — Telephone Encounter (Signed)
Will route message to [facility] for follow up.

## 2016-06-14 ENCOUNTER — Encounter: Payer: Self-pay | Admitting: *Deleted

## 2016-06-15 NOTE — Telephone Encounter (Signed)
Patient is calling to follow up on [employer]. Patient need summons completed and placed in mail by Friday. Please call patient once this is done.

## 2016-06-16 NOTE — Telephone Encounter (Signed)
SN please advise if you are okay with providing pt with a jury summons excuse letter. Thanks!

## 2016-06-16 NOTE — Telephone Encounter (Signed)
Per Marliss Czar  - the letter plus the original jury summons was placed in the mail on 3.13.18.   Called and spoke to pt, she has still not received this yet, pt states she needs this tomorrow 3.16.18. Called and left message for federal jury clerk, Augustina Mood, at [phone] and left detailed message stating dilemma. Advised pt that I will try and call back tomorrow morning and will also call her back as well. Will forward this to my box to f/u on.

## 2016-06-16 NOTE — Telephone Encounter (Signed)
Spoke with SN who remembered Katrina Campbell already doing this Letter in patient's chart and reprinted for SN to sign again  Letter signed by SN Called spoke with patient - letter personally placed up front in brown folder for her to pick (the summons is not here in the office for [place] to mail to the courthouse on her behalf)  Nothing further needed; will sign off

## 2016-06-17 NOTE — Telephone Encounter (Signed)
[place] at [phone] and was advised that even though it is due today 06/17/16, they will still accept it as long as the letter excusing her from jury duty was dated prior to due date. Spoke with Leigh and the summons and the letter was placed in the mail earlier this week. Pt aware. Nothing further needed at this time.

## 2016-06-23 ENCOUNTER — Ambulatory Visit (INDEPENDENT_AMBULATORY_CARE_PROVIDER_SITE_OTHER): Admitting: Cardiology

## 2016-06-23 ENCOUNTER — Encounter: Payer: Self-pay | Admitting: Cardiology

## 2016-06-23 VITALS — BP 132/82 | HR 80 | Ht 64.0 in | Wt 135.0 lb

## 2016-06-23 DIAGNOSIS — E785 Hyperlipidemia, unspecified: Secondary | ICD-10-CM | POA: Diagnosis not present

## 2016-06-23 DIAGNOSIS — I2510 Atherosclerotic heart disease of native coronary artery without angina pectoris: Secondary | ICD-10-CM

## 2016-06-23 DIAGNOSIS — R0609 Other forms of dyspnea: Secondary | ICD-10-CM | POA: Diagnosis not present

## 2016-06-23 DIAGNOSIS — R938 Abnormal findings on diagnostic imaging of other specified body structures: Secondary | ICD-10-CM

## 2016-06-23 DIAGNOSIS — I700 Atherosclerosis of aorta: Secondary | ICD-10-CM | POA: Diagnosis not present

## 2016-06-23 DIAGNOSIS — R0600 Dyspnea, unspecified: Secondary | ICD-10-CM

## 2016-06-23 DIAGNOSIS — R9389 Abnormal findings on diagnostic imaging of other specified body structures: Secondary | ICD-10-CM

## 2016-06-23 MED ORDER — ROSUVASTATIN CALCIUM 10 MG PO TABS: 10.0000 mg | ORAL_TABLET | Freq: Every day | ORAL | 1 refills | Status: DC

## 2016-06-23 NOTE — Progress Notes (Signed)
Cardiology Office Note    Date:  06/23/2016   ID:  Katrina Campbell, DOB Mar 21, 1956, MRN [id number]  PCP:  Alesia Richards, MD  Cardiologist:   Ena Dawley, MD  Referring physician:  Alesia Richards, MD   Chief complain: DOE, calcifications on chest CT  History of Present Illness:  Katrina Campbell is a 61 y.o. female with prior medical history of COPD depression hyperlipidemia who has been referred to [place] by her primary care physician for incidental finding of coronary artery calcification on chest CT performed for finding of pulmonary nodule. Patient has been in fairly good health, she was previously told she has high cholesterol but didn't want to take cholesterol medicine. She worked in [employer] and smoked until 4 years ago. In February of this year she lost her only daughter due to drug overdose and is currently going through major grief and depression with anxiety attacks and chest pain. She has always been very active and exercises on treadmill but lately she has noticed dyspnea on exertion when she walks on a treadmill. She denies palpitations or syncope. In her family her mother had CHF and died at age of 63 father had CHF and died at age of 45 both of her brother and sister died of lung disease. She denies any lower extremity edema no orthopnea or paroxysmal nocturnal dyspnea.  Past Medical History:  Diagnosis Date  . Anxiety   . COPD (chronic obstructive pulmonary disease) ([place])   . Depression   . Hyperlipidemia   . Labile hypertension     Past Surgical History:  Procedure Laterality Date  . NASAL SINUS SURGERY  1986    Current Medications: Outpatient Medications Prior to Visit  Medication Sig Dispense Refill  . albuterol (PROAIR HFA) 108 (90 BASE) MCG/ACT inhaler Inhale 2 puffs into the lungs every 6 (six) hours as needed for wheezing or shortness of breath. 18 g 3  . aspirin 81 MG tablet Take 81 mg by mouth daily.    Marland Kitchen buPROPion (WELLBUTRIN XL) 300 MG  24 hr tablet TAKE 1 TABLET DAILY 90 tablet 0  . calcium carbonate (OS-CAL) 600 MG TABS tablet Take 600 mg by mouth daily with breakfast.    . clonazePAM (KLONOPIN) 0.5 MG tablet Take 1/2 tablet by mouth two times daily 30 tablet 5  . fluticasone (FLONASE) 50 MCG/ACT nasal spray Place 1 spray into both nostrils 2 (two) times daily as needed for allergies or rhinitis. 16 g 3  . Fluticasone-Salmeterol (ADVAIR DISKUS) 250-50 MCG/DOSE AEPB 2 puffs, twice daily 180 each 1  . guaiFENesin (MUCINEX) 600 MG 12 hr tablet Take 1,200 mg by mouth 2 (two) times daily.     Marland Kitchen ibuprofen (ADVIL) 200 MG tablet Take 400 mg by mouth every 8 (eight) hours as needed for fever.    . INCRUSE ELLIPTA 62.5 MCG/INH AEPB USE 1 INHALATION DAILY (DISCARD 6 WEEKS AFTER OPENING FOIL TRAY) 90 each 1  . Multiple Vitamin (MULTIVITAMIN WITH MINERALS) TABS Take 1 tablet by mouth every morning.    . vitamin C (ASCORBIC ACID) 500 MG tablet Take 500 mg by mouth every morning.     No facility-administered medications prior to visit.      Allergies:   Patient has no known allergies.   Social History   Social History  . Marital status: Married    Spouse name: N/A  . Number of children: N/A  . Years of education: N/A   Social History Main Topics  . Smoking status:  Former Smoker    Packs/day: 1.00    Years: 40.00    Types: Cigarettes    Quit date: 11/09/2012  . Smokeless tobacco: Current User    Last attempt to quit: 11/02/2012     Comment: vapor  . Alcohol use 25.2 oz/week    42 Cans of beer per week  . Drug use: No  . Sexual activity: Not Asked   Other Topics Concern  . None   Social History Narrative  . None     Family History:  The patient's family history includes Asthma in her other; Congestive Heart Failure in her father and mother; Emphysema in her mother; Heart attack in her father and mother; Lung disease in her brother and sister.   ROS:   Please see the history of present illness.    ROS All other systems  reviewed and are negative.   PHYSICAL EXAM:   VS:  BP 132/82   Pulse 80   Ht '5\' 4"'$  (1.626 m)   Wt 135 lb (61.2 kg)   SpO2 96%   BMI 23.17 kg/m    GEN: Well nourished, well developed, in no acute distress  HEENT: normal  Neck: no JVD, carotid bruits, or masses Cardiac: RRR; no murmurs, rubs, or gallops,no edema  Respiratory:  clear to auscultation bilaterally, normal work of breathing GI: soft, nontender, nondistended, + BS MS: no deformity or atrophy  Skin: warm and dry, no rash Neuro:  Alert and Oriented x 3, Strength and sensation are intact Psych: euthymic mood, full affect  Wt Readings from Last 3 Encounters:  06/23/16 135 lb (61.2 kg)  05/19/16 137 lb (62.1 kg)  12/30/15 138 lb 12.8 oz (63 kg)      Studies/Labs Reviewed:   EKG:  EKG is ordered today.  The EKG was personally reviewed and showed normal sinus rhythm normal EKG.  Recent Labs: 12/30/2015: ALT 22; BUN 14; Creat 0.76; Hemoglobin 13.8; Magnesium 2.1; Platelets 158; Potassium 4.0; Sodium 144; TSH 3.08   Lipid Panel    Component Value Date/Time   CHOL 243 (H) 12/30/2015 1536   TRIG 191 (H) 12/30/2015 1536   HDL 72 12/30/2015 1536   CHOLHDL 3.4 12/30/2015 1536   VLDL 38 (H) 12/30/2015 1536   LDLCALC 133 (H) 12/30/2015 1536    Additional studies/ records that were reviewed today include:   Chest CT performed 03/30/2017 1. Resolution of previously described nodule along the right minor fissure. 2. Advanced emphysema, without acute superimposed process. 3. Age advanced coronary artery atherosclerosis. Recommend assessment of coronary risk factors and consideration of medical therapy. 4. Borderline to mild ascending aortic aneurysm, unchanged.   ASSESSMENT:    1. Coronary artery calcification seen on CT scan   2. DOE (dyspnea on exertion)   3. Abnormal CT of the chest   4. Hyperlipidemia, unspecified hyperlipidemia type   5. Atherosclerosis of aorta (HCC)     PLAN:  In order of problems  listed above:  1. The patient has symptoms of typical exertional dyspnea and evidence of coronary calcification in the proximal portions of all 3 coronary arteries seen on chest CT did have personally reviewed. We will schedule an exercise nuclear stress test to determine if any of those calcifications or functionally significant. 2. Coronary calcification on chest CT - the patient had LDL 133 in September 2017 and evidence of heavy coronary calcification on chest CT, she needs to be started treated with statin, I will start rosuvastatin 10 mg daily and repeat lipids  and LFTs in 2 months. 3. Hyperlipidemia - as above   Medication Adjustments/Labs and Tests Ordered: Current medicines are reviewed at length with the patient today.  Concerns regarding medicines are outlined above.  Medication changes, Labs and Tests ordered today are listed in the Patient Instructions below. Patient Instructions  Medication Instructions:   START TAKING ROSUVASTATIN 10 MG ONCE DAILY     Labwork:  PRIOR TOO YOUR 2 MONTH FOLLOW-UP WITH DR Meda Coffee TO CHECK A---CMET AND LIPIDS--PLEASE COME FASTING TO THIS LAB APPOINTMENT     Testing/Procedures:  Your physician has requested that you have en exercise stress myoview. For further information please visit [email or web address]. Please follow instruction sheet, as given.    Follow-Up:  2 MONTHS WITH DR Vasilis Luhman--PLEASE HAVE YOUR LABS DONE A WEEK PRIOR TOO THIS APPOINTMENT       If you need a refill on your cardiac medications before your next appointment, please call your pharmacy.      Signed, Ena Dawley, MD  06/23/2016 6:36 PM    [address], [place], [place]  [address] Phone: [phone]; Fax: [phone]

## 2016-06-23 NOTE — Patient Instructions (Signed)
Medication Instructions:   START TAKING ROSUVASTATIN 10 MG ONCE DAILY     Labwork:  PRIOR TOO YOUR 2 MONTH FOLLOW-UP WITH DR Meda Coffee TO CHECK A---CMET AND LIPIDS--PLEASE COME FASTING TO THIS LAB APPOINTMENT     Testing/Procedures:  Your physician has requested that you have en exercise stress myoview. For further information please visit [email or web address]. Please follow instruction sheet, as given.    Follow-Up:  2 MONTHS WITH DR NELSON--PLEASE HAVE YOUR LABS DONE A WEEK PRIOR TOO THIS APPOINTMENT       If you need a refill on your cardiac medications before your next appointment, please call your pharmacy.

## 2016-06-27 ENCOUNTER — Telehealth (HOSPITAL_COMMUNITY): Payer: Self-pay | Admitting: *Deleted

## 2016-06-27 NOTE — Telephone Encounter (Signed)
Patient given detailed instructions per Myocardial Perfusion Study Information Sheet for the test on 06/28/16 at 0715. Patient notified to arrive 15 minutes early and that it is imperative to arrive on time for appointment to keep from having the test rescheduled.  If you need to cancel or reschedule your appointment, please call the office within 24 hours of your appointment. Failure to do so may result in a cancellation of your appointment, and a $50 no show fee. Patient verbalized understanding.Chiyoko Torrico, Ranae Palms

## 2016-06-28 ENCOUNTER — Ambulatory Visit (HOSPITAL_COMMUNITY): Attending: Cardiology

## 2016-06-28 ENCOUNTER — Ambulatory Visit: Payer: Self-pay | Admitting: Physician Assistant

## 2016-06-28 DIAGNOSIS — E785 Hyperlipidemia, unspecified: Secondary | ICD-10-CM | POA: Insufficient documentation

## 2016-06-28 DIAGNOSIS — R938 Abnormal findings on diagnostic imaging of other specified body structures: Secondary | ICD-10-CM | POA: Diagnosis not present

## 2016-06-28 DIAGNOSIS — I700 Atherosclerosis of aorta: Secondary | ICD-10-CM | POA: Diagnosis not present

## 2016-06-28 DIAGNOSIS — R9389 Abnormal findings on diagnostic imaging of other specified body structures: Secondary | ICD-10-CM

## 2016-06-28 DIAGNOSIS — R0609 Other forms of dyspnea: Secondary | ICD-10-CM | POA: Diagnosis not present

## 2016-06-28 DIAGNOSIS — R0600 Dyspnea, unspecified: Secondary | ICD-10-CM

## 2016-06-28 DIAGNOSIS — R9439 Abnormal result of other cardiovascular function study: Secondary | ICD-10-CM | POA: Diagnosis not present

## 2016-06-28 LAB — MYOCARDIAL PERFUSION IMAGING
Estimated workload: 10.4 METS
Exercise duration (min): 8 min
Exercise duration (sec): 5 s
LV dias vol: 78 mL (ref 46–106)
LV sys vol: 33 mL
MPHR: 158 {beats}/min
Peak BP: 150 mmHg
Peak HR: 150 {beats}/min
Percent HR: 94 %
RATE: 0.22
RPE: 18
Rest HR: 66 {beats}/min
SDS: 1
SRS: 6
SSS: 5
TID: 0.94

## 2016-06-28 MED ORDER — TECHNETIUM TC 99M TETROFOSMIN IV KIT
32.4000 | PACK | Freq: Once | INTRAVENOUS | Status: AC | PRN
Start: 2016-06-28 — End: 2016-06-28
  Administered 2016-06-28: 32.4 via INTRAVENOUS
  Filled 2016-06-28: qty 33

## 2016-06-28 MED ORDER — TECHNETIUM TC 99M TETROFOSMIN IV KIT
10.9000 | PACK | Freq: Once | INTRAVENOUS | Status: AC | PRN
  Administered 2016-06-28: 10.9 via INTRAVENOUS
  Filled 2016-06-28: qty 11

## 2016-06-29 ENCOUNTER — Telehealth: Payer: Self-pay | Admitting: Cardiology

## 2016-06-29 NOTE — Telephone Encounter (Signed)
New Message   pt verbalized that she is returning call for rn for her results 06-28-2016

## 2016-06-29 NOTE — Telephone Encounter (Signed)
The pt has been given her stress test results. She verbalized understanding.

## 2016-06-30 ENCOUNTER — Ambulatory Visit (INDEPENDENT_AMBULATORY_CARE_PROVIDER_SITE_OTHER): Payer: TRICARE For Life (TFL) | Admitting: Pulmonary Disease

## 2016-06-30 VITALS — BP 116/74 | HR 65 | Temp 96.5°F | Ht 64.0 in | Wt 134.5 lb

## 2016-06-30 DIAGNOSIS — R938 Abnormal findings on diagnostic imaging of other specified body structures: Secondary | ICD-10-CM | POA: Diagnosis not present

## 2016-06-30 DIAGNOSIS — F419 Anxiety disorder, unspecified: Secondary | ICD-10-CM

## 2016-06-30 DIAGNOSIS — J432 Centrilobular emphysema: Secondary | ICD-10-CM

## 2016-06-30 DIAGNOSIS — F320 Major depressive disorder, single episode, mild: Secondary | ICD-10-CM | POA: Diagnosis not present

## 2016-06-30 DIAGNOSIS — I10 Essential (primary) hypertension: Secondary | ICD-10-CM

## 2016-06-30 DIAGNOSIS — I700 Atherosclerosis of aorta: Secondary | ICD-10-CM | POA: Diagnosis not present

## 2016-06-30 DIAGNOSIS — R9389 Abnormal findings on diagnostic imaging of other specified body structures: Secondary | ICD-10-CM

## 2016-06-30 DIAGNOSIS — I712 Thoracic aortic aneurysm, without rupture, unspecified: Secondary | ICD-10-CM

## 2016-06-30 DIAGNOSIS — R0989 Other specified symptoms and signs involving the circulatory and respiratory systems: Secondary | ICD-10-CM

## 2016-06-30 MED ORDER — FLUTICASONE-SALMETEROL 250-50 MCG/DOSE IN AEPB: 1.0000 | INHALATION_SPRAY | Freq: Two times a day (BID) | RESPIRATORY_TRACT | 0 refills | Status: DC

## 2016-06-30 NOTE — Patient Instructions (Signed)
Today we updated your med list in our EPIC system...    Continue your current medications the same...  We discussed using the Klonopin more regularly...    Try 1/4th tab during the day & reserve the 1/2 tab dose for bedtime... ' Continue the [place]...  Call for any questions...  Let's plan a follow up visit in 34mo sooner if needed for any problems..Marland KitchenMarland Kitchen

## 2016-07-03 ENCOUNTER — Encounter: Payer: Self-pay | Admitting: Pulmonary Disease

## 2016-07-03 NOTE — Progress Notes (Signed)
Subjective:     Patient ID: Katrina Campbell, female   DOB: [birth year], 61 y.o.   MRN: [id number]  HPI 61 y/o WF, former smoker who quit in 2014, w/ COPD/Emphysema (GOLD Stage 2), Hx LUL pneumonia 10/16 that was slow to clear, f/u CT Chest 2/17 w/ resolution of LUL changes (scarring) but coronary & Aortic atherosclerotic calcif, mild centrilob & pqaraseptal emphysema, & a new 2m nodule along minor fissure => needs f/u...  ~  February 10, 2015:  Initial pulmonary consult by SN>  [age]y/o WF, referred by [name]& Dr. BGlenda Chromanfor an abn CXR>      Katrina Campbell an ex-smoker, having started in her teens and smoked for ~45 yrs up to 2ppd, for an est 61pack-yr smoking hx (she quit in 2014 because her fingers were purple);  She carries the Dx of underlying COPD & was placed on Advair250, also tried Incruse x 345mout she stopped this med;  She describes a developing cough, prod of a small amt of green phlegm (no blood), assoc w/ increased SOB and sharp left upper CP, and decr energy "I was past going";  She went to urgent care where a CXR showed a left upper lobe opac "I was treated for pneumonia w/ 2 shots & Levaquin x 10d";  [facility] (see below).  Smoking Hx>  As above  Pulmonary Hx>  She has a hx of sinusitis w/ surg x2;  She has had occas bouts of bronchitis requiring antibiotics & pneumonia in 2015 treated w/ antibiotic & pred w/ resolution;  No hx asthma, no hx TB or known exposure;  She admits to mild cough, some chest congestion, but denied SOB/DOE before the last 2-3 yrs...  Medical Hx>  HBP (but not requiring meds now), HL (on diet alone), anxiety/ depression (on WellbutrinXL)...Marland KitchenMarland KitchenFamily Hx>  Father was a smoker w/ emphysema & died in his 7021's/ CHF; mother died at 7427/ CHF; all 3 sibs have lung dis- 1Bro died 5022'd/ lung cancer, 1Sis died w/ pneumonia, 1Sis alive w/ COPD...  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  Current Meds>  Advair250, AlbuterolHFA, Mucinex, ASA81, MVIs, WellbutrinXL300... EXAM shows Afeb, VSS, O2sat=98% on RA at rest;  HEENT- neg, mallampati1;  Chest- decr BS at bases, w/o w/r/r;  Heart- RR w/o m/r/g;  Abd- soft, neg;  Ext- w/o c/c/e;  Neuro- intact...   CXR 07/19/12 showed norm heart size, COPD w/ hyperinflation, resolved prev lingular infiltrate- now clear/ NAD...   [facility] athersclerotic calcif in Ao & branch vessels including the coronaries, no adenopathy, COPD changes noted, no pulm nodules, min RML atx  LABS 9/16 in Epic>  Chems- wnl;  CBC- wnl;  Norm VitD, B12, & Iron;  TSH=3.82...  [facility], underlying COPD w/ hyperinflation, 2.6cm density LUL medially- infiltrate vs mass...  CT Chest performed 01/27/15 [facility] showed left apical (medial/post) opacity assoc w/ centrilob emphysema changes & scattered areas of scarring  Spirometry 02/10/15 showed FVC=2.98 (97%), FEV1=1.59 (66%), %1sec=54%, mid-flows reduced at 33% predicted... c/w mod severe airflow obstruction & GOLD Stage2 COPD.  Ambulatory Oxygen saturation test on RA 02/10/15>  O2sat=97% w/ pulse=62/min;  She ambulated 3 laps w/ lowest O2sat=84% w/ pulse=102/min (SOB during a cough  paroxysm)...  IMP/PLAN>>  Cyndal has mod COPD & quit smoking 2014;  She had a lingular pneumonia in 2015 which resolved to baseline after antibiotics and pred rx;  She presented w/ acute LUL symptoms c/w pneumonia and an abn CXR=> slow to resolve after antibiotic rx;  I told her that in my opinion we should give it a little longer to see if this represents XRay lag time- rec to take AUGMENTIN875Bid/Align, continue MUCINEX(2Bid)/ fluids, continue ADVAIR250Bid, and restart the INCRUSE daily;  We plan ROV in 3wks w/ CXR & to consider timing of a repeat CT, consider PET, etc....    ~  March 11, 2015:  65mo ROV w/ SN>  Katrina Campbell reports that she is breathing sl better- notes sl congestion, sl sore throat, sl cough/ beige phlegm, sl left scapular pain, no f/c/s=> we discussed DukesMMW for throat & continue meds + Mucinex 1200mg Bid w/ fluids...     COPD/ Emphysema> on Advair250Bid, Incruse daily, Mucinex 600- 1to2Bid    Hx LUL pneumonia w/ left apical opac on CT 01/2015 slow to clear>     Ex-smoker w/ 65+pack-yr hx>  She quit smoking in 2014...    Hx sinusitis w/ surg x2 in the past EXAM shows Afeb, VSS, O2sat=99% on RA at rest;  HEENT- neg, mallampati1;  Chest- decr BS at bases, sl congested cough, w/o w/r/r;  Heart- RR w/o m/r/g;  Abd- soft, neg;  Ext- w/o c/c/e;  Neuro- intact...   CXR 03/11/15 showed norm heart size, hyperinflation w/ interstitial coarsening, NAD...  IMP/PLAN>>  Katrina Campbell has Stage 2 COPD/ emphysema & prev LUL pneumonia w/ CT Chest 01/2015 showing left apical opacity (slow to clear); she has quit smoking (2014), takes Advair250Bid, Incruse daily, Mucinex, and we discussed need for incr exercise program... CXR today w/ COPD/emphysema & left apex looks ok- we decided to wait 20mo & recheck CT at that time...   ~  May 12, 2015:  30mo ROV & Katrina Campbell notes some chr congestion, clear sput, & DOE- she continues on Advbair250, Incruse, Proair, Mucinex, Fluids... It is time for her f/u CT Chest...    COPD/ Emphysema> on Advair250Bid, Incruse daily, Proair, Mucinex 600- 1to2Bid; Mod airflow obstruction w/ GOLD Stage2 COPD    Hx LUL pneumonia w/ left apical opac on CT 01/2015 slow to clear> also had lingular pneumonia 2015    Ex-smoker w/ 65+pack-yr hx>  She quit smoking in 2014...    Hx sinusitis w/ surg x2 in the past    Atherosclerotic calcif in Ao, branch vessels, and coronaries on prev CTs...  EXAM shows Afeb, VSS, O2sat=97% on RA at rest;  HEENT- neg, mallampati1;  Chest- decr BS at bases, sl congested cough, w/o w/r/r;  Heart- RR w/o m/r/g;  Abd- soft, neg;  Ext-  w/o c/c/e;  Neuro- intact  CXR 05/12/15 showed norm heart size, hyperinflation/ clear lungs/ NAD...   CT Chest 05/22/15 showed norm heart size, coronary calcif & atherosclerotic calcif of Ao, no signif adenopathy, mild centrilob & pqaraseptal emphysema, prev left apical opac is resolved w/ mild resid scarring, new 4mm nodule along minor fissure & radiology rec f/u CT in 62yr... IMP/PLAN>>  Left apical opac is resolved, new 43mm nodule in right mid-zone noted; for the cough she will continue the Mucinex and for the Dyspnea- advised again to start exercise program... We plan ROV in 40mo.  ~  November 09, 2015:  2mo ROV & Katrina Campbell has retired from [employer], enjoying retirement but not exercising 7 we discussed  the need for a regular exercise program;  She is concerned about 8# wt gain (her BMI=24) and this provides an ideal situation to encourage an exercise program- we discussed a Gym, the [employer], Yoga, etc... She had some left shoulder pain, saw an orthopedist w/ shot for frozen shoulder which helped some... Her breathing is stable- some DOE w/ exertion, notes occas wheezing, min cough, no sput, etc; she is regular w/ her ADVAIR-250 (2spBid) and Incruse (once daily) she is using Mucinex1200mg /d and AlbutHFA rescue inhaler as needed (once daily on ave); she thinks that her small dog may be part of her prob indoors and we reviewed having a safe room w/ HEPA filter air cleaner to creat a good environment for her to breathe...  EXAM shows Afeb, VSS, O2sat=98% on RA at rest;  HEENT- neg, mallampati1;  Chest- decr BS at bases, clear w/o w/r/r;  Heart- RR w/o m/r/g;  Abd- soft, neg;  Ext- w/o c/c/e;  Neuro- intact  We reviewed prev CXRs & CT Chest scan IMP/PLAN>>  Katrina Campbell is reassured about her breathing; doing satis off cigarettes for 36yrs, taking her inhalers regularly; now needs to incr her exercise program & push the envelope for max improvement- briefly discussed Pulm Rehab but she wants to try it on her own & I  agree; we plan ROV recheck & f/u CT Chest in 57mo... In the meanwhile we reviewed adult vaccine schedule but she is reluctant, says she hasn't taken any vaccines, thinks her sisters vaccination gave her severe pneumonia a few yrs ago; I didn't push the issue but discussed the benefits from Pneumococcal vaccination 7 the flu shots; she will consider these options...   ~  May 19, 2016:  42mo ROV & Katrina Campbell shared the tragic news that her 61 y/o daughter (who lived w/ her) overdosed 2 wks ago & passed away (she had been in [place] x2 w/o help); Addisson is understandably tearful, can't sleep, notes breathing is worse w/ incr use of her rescue inhaler (and little benefit)... We discussed trial of Klonopin for the dyspnea and anxiety, cautioned not to fall back on old habits and stay away from cigs...     COPD/ Emphysema> on Advair250Bid, Incruse daily, Mucinex 600- 1to2Bid; Proair rescue & Flonase prn; CT Chest showed centrilob & paraseptal emphysema and PFTs showed GOLD Stage 2 disease...    Hx LUL pneumonia w/ left apical opac on CT 01/2015 slow to clear> f/u CT Chest 2/17 showed resolution of LUL changes    68mm pulm nodule on CT Chest 05/2015> located along the minor fissure & f/u due 62yr => f/u CT Chest 05/2016 w/ resolution of the nodule    Ex-smoker w/ 65+pack-yr hx>  She quit smoking in 2014...    Hx sinusitis w/ surg x2 in the past    Cardiac Issues> on ASA81; coronary & aortic calcif seen on CT Chest; prev hx of HBP (not on meds now), HL (not on meds) => CT Chest 05/2016 w/ Asc Ao measure of 4cm, +cardiac risk factors therefore referred to Cardiology to establish.    Medical issues> PCP is DrMcKeown on WellbutrinXL300 to help depression EXAM shows Afeb, VSS, O2sat=97% on RA at rest;  HEENT- neg, mallampati1;  Chest- decr BS at bases, clear w/o w/r/r;  Heart- RR w/o m/r/g;  Abd- soft, neg;  Ext- w/o c/c/e;  Neuro- intact  CT Chest => pending IMP/PLAN>>  Katrina Campbell family has suffered a terrible event & she  has developed incr dyspnea as a result;  We discussed the benefit & roll of Klonopin in this setting & she will try 0.'5mg'$  1/2 to 1 tab Bid;  She is due for her f/u CT Chest & we will plan brief recheck in 6-8 wks... Note: >50% of this 48mn ov was spent in counseling & coordination of care...  ADDENDUM>>  CT CHEST 05/31/16 showed mod emphysema, no adenopathy, and the prev nodule along the right minor fissure has resolved;  Other CT findings include atherosclerotic changes in Ao & coronaries, and some dilatation of ascending Ao measuring ~4cm...  She has several risk factors for CAD w/ prev smoking & elev chol;  EKGs reviewed w/ poor R progression from V1=>3...  REC Cardiac eval to establish & follow going forward => [facility]   ~  June 30, 2016:  6wk ROV & CSanaiihas been using the Klonopin 0.'5mg'$ - taking 1/2 tab Qhs but not using it during the day- we reviewed her options for taking this med and the expected benefits... In addition she saw [facility]- DrKNelson 06/23/16 for initial eval> note reviewed CAD seen on CT Chest, HBP (not currently on meds), HL (not on meds), ex-smoker, +FamHx heart dis;  She rec Nuclear stress test, and start med rx w/ Cres10...    COPD/emphysema> on Advair, Incruse, Mucinex, Proair; she is stable w/ DOE, min cough, no sput, no hemoptysis, etc...    Hx LUL pneumonia 10/16 that was slow to clear on CXR> finally resolved and serial CT scans showed resolution w/ persistent COPD/emphysema, atherosclerotic changes & ~4cm dilatation of the asc ao...    Ex-smoker w/ 65+pack-yr hx>  She quit smoking in 2014... EXAM shows Afeb, VSS, O2sat=97% on RA at rest;  HEENT- neg, mallampati1;  Chest- decr BS at bases, clear w/o w/r/r;  Heart- RR w/o m/r/g;  Abd- soft, neg;  Ext- w/o c/c/e;  Neuro- intact  EKG 06/23/16>  NSR, rate85, poor R prog V1-2, min NSSTTWA...   Nuclear Stress test 06/28/16>  Felt to be normal w/ breast attenuation but no ischemia, norm BP response, EF=58%,  normal wall motion...  IMP/PLAN>>  Katrina Campbell stable- she will continue the statin rx per drNelson & use the Klonopin as we discussed; stable on the Advair/ Incruse/ etc... we plan rov 672mosooner prn breathing problems...    Past Medical History  Diagnosis Date  . Depression >> on WellbutrinXL300   . Anxiety   . COPD (chronic obstructive pulmonary disease) (HCC) >> on Advair250 & Incruse   . Hyperlipidemia   . Labile hypertension >> not current on BP meds      Past Surgical History:  Procedure Laterality Date  . NATrappe  Outpatient Encounter Prescriptions as of 06/30/2016  Medication Sig  . albuterol (PROAIR HFA) 108 (90 BASE) MCG/ACT inhaler Inhale 2 puffs into the lungs every 6 (six) hours as needed for wheezing or shortness of breath.  . Marland Kitchenspirin 81 MG tablet Take 81 mg by mouth daily.  . Marland KitchenuPROPion (WELLBUTRIN XL) 300 MG 24 hr tablet TAKE 1 TABLET DAILY  . calcium carbonate (OS-CAL) 600 MG TABS tablet Take 600 mg by mouth daily with breakfast.  . clonazePAM (KLONOPIN) 0.5 MG tablet Take 1/2 tablet by mouth two times daily  . fluticasone (FLONASE) 50 MCG/ACT nasal spray Place 1 spray into both nostrils 2 (two) times daily as needed for allergies or rhinitis.  . Fluticasone-Salmeterol (ADVAIR DISKUS) 250-50 MCG/DOSE AEPB 2 puffs, twice daily  . guaiFENesin (MUCINEX) 600 MG  12 hr tablet Take 1,200 mg by mouth 2 (two) times daily.   Marland Kitchen ibuprofen (ADVIL) 200 MG tablet Take 400 mg by mouth every 8 (eight) hours as needed for fever.  . INCRUSE ELLIPTA 62.5 MCG/INH AEPB USE 1 INHALATION DAILY (DISCARD 6 WEEKS AFTER OPENING FOIL TRAY)  . Multiple Vitamin (MULTIVITAMIN WITH MINERALS) TABS Take 1 tablet by mouth every morning.  . rosuvastatin (CRESTOR) 10 MG tablet Take 1 tablet (10 mg total) by mouth daily.  . vitamin C (ASCORBIC ACID) 500 MG tablet Take 500 mg by mouth every morning.  . Fluticasone-Salmeterol (ADVAIR DISKUS) 250-50 MCG/DOSE AEPB Inhale 1 puff into the  lungs 2 (two) times daily.   No facility-administered encounter medications on file as of 06/30/2016.     No Known Allergies   Current Medications, Allergies, Past Medical History, Past Surgical History, Family History, and Social History were reviewed in [employer] record.   Review of Systems             All symptoms NEG except where BOLDED >>  Constitutional:  F/C/S, fatigue, anorexia, unexpected weight change. HEENT:  HA, visual changes, hearing loss, earache, nasal symptoms, sore throat, mouth sores, hoarseness. Resp:  cough, sputum, hemoptysis; SOB, tightness, wheezing. Cardio:  CP, palpit, DOE, orthopnea, edema. GI:  N/V/D/C, blood in stool; reflux, abd pain, distention, gas. GU:  dysuria, freq, urgency, hematuria, flank pain, voiding difficulty. MS:  joint pain, swelling, tenderness, decr ROM; neck pain, back pain, etc. Neuro:  HA, tremors, seizures, dizziness, syncope, weakness, numbness, gait abn. Skin:  suspicious lesions or skin rash. Heme:  adenopathy, bruising, bleeding. Psyche:  confusion, agitation, sleep disturbance, hallucinations, anxiety, depression suicidal.   Objective:   Physical Exam       Vital Signs:  Reviewed...   General:  WD, WN, 61 y/o WF in NAD; alert & oriented; pleasant & cooperative... HEENT:  [place]/AT; Conjunctiva- pink, Sclera- nonicteric, EOM-wnl, PERRLA, EACs-clear, TMs-wnl; NOSE-clear; THROAT-clear & wnl.  Neck:  Supple w/ fair ROM; no JVD; normal carotid impulses w/o bruits; no thyromegaly or nodules palpated; no lymphadenopathy.  Chest:  Decr BS at bases, clear to P & A x few scat rhonchi & sl congested cough- without wheezes, rales, or signs of consolidation. Heart:  Regular Rhythm; norm S1 & S2 without murmurs, rubs, or gallops detected. Abdomen:  Soft & nontender- no guarding or rebound; normal bowel sounds; no organomegaly or masses palpated. Ext:  Normal ROM; without deformities or arthritic changes; no varicose  veins, venous insuffic, or edema;  Pulses intact w/o bruits. Neuro:  No focal neuro deficits; sensory testing normal; gait normal & balance OK. Derm:  No lesions noted; no rash etc. Lymph:  No cervical, supraclavicular, axillary, or inguinal adenopathy palpated.   Assessment:      IMP >>     COPD/ Emphysema> on Advair250Bid, Incruse daily, Mucinex 600- 1to2Bid; Proair rescue & Flonase prn; CT Chest showed centrilob & paraseptal emphysema and PFTs showed GOLD Stage 2 disease...    Hx LUL pneumonia w/ left apical opac on CT 01/2015 slow to clear> f/u CT Chest 2/17 showed resolution of LUL changes    74m pulm nodule on CT Chest 05/2015> located along the minor fissure & f/u due 120yr> f/u CT Chest 05/2016 w/ resolution of the nodule    Ex-smoker w/ 65+pack-yr hx>  She quit smoking in 2014...    Hx sinusitis w/ surg x2 in the past    Cardiac Issues> on ASA81; coronary &  aortic calcif seen on CT Chest; prev hx of HBP (not on meds now) => CT Chest 05/2016 w/ Asc Ao measure of 4cm, +cardiac risk factors therefore referred to Cardiology to establish.    Medical issues> PCP is DrMcKeown on WellbutrinXL300 to help depression.Marland Kitchen   PLAN >>  02/10/15>  Darcey has mod COPD & quit smoking 2014;  She had a lingular pneumonia in 2015 which resolved to baseline after antibiotics and pred rx;  She presented w/ acute LUL symptoms c/w pneumonia and an abn CXR=> slow to resolve after antibiotic rx;  I told her that in my opinion we should give it a little longer to see if this represents XRay lag time- rec to take AUGMENTIN875Bid/Align, continue MUCINEX(2Bid)/ fluids, continue ADVAIR250Bid, and restart the INCRUSE daily;  We plan ROV in 3wks w/ CXR & to consider timing of a repeat CT, consider PET, etc... 03/11/15>  Acire has Stage 2 COPD/ emphysema & prev LUL pneumonia w/ CT Chest 01/2015 showing left apical opacity (slow to clear); she has quit smoking (2014), takes Advair250Bid, Incruse daily, Mucinex, and we discussed  need for incr exercise program... CXR today w/ COPD/emphysema & left apex looks ok- we decided to wait 34mo& recheck CT at that time.  05/12/15>  Left apical opac is resolved, new 455mnodule in right mid-zone noted; for the cough she will continue the Mucinex and for the Dyspnea- advised again to start exercise program... We plan ROV in 56m32mo/7/17>  Katrina Campbell reassured about her breathing; doing satis off cigarettes for 79yr66yraking her inhalers regularly; now needs to incr her exercise program & push the envelope for max improvement- briefly discussed Pulm Rehab but she wants to try it on her own & I agree; we plan ROV recheck & f/u CT Chest in 554mo.23mo/15/18>   Katrina Campbell family has suffered a terrible event & she has developed incr dyspnea as a result;  We discussed the benefit & roll of Klonopin in this setting & she will try 0.'5mg'$  1/2 to 1 tab Bid;  She is due for her f/u CT Chest & we will plan brief recheck in 6-8 wks  06/30/16>   Katrina Campbell- she will continue the statin rx per drNelson & use the Klonopin as we discussed; stable on the Advair/ Incruse/ etc... we plan rov 554mo, 55moer prn breathing problems...   Plan:     Patient's Medications  New Prescriptions   FLUTICASONE-SALMETEROL (ADVAIR DISKUS) 250-50 MCG/DOSE AEPB    Inhale 1 puff into the lungs 2 (two) times daily.  Previous Medications   ALBUTEROL (PROAIR HFA) 108 (90 BASE) MCG/ACT INHALER    Inhale 2 puffs into the lungs every 6 (six) hours as needed for wheezing or shortness of breath.   ASPIRIN 81 MG TABLET    Take 81 mg by mouth daily.   BUPROPION (WELLBUTRIN XL) 300 MG 24 HR TABLET    TAKE 1 TABLET DAILY   CALCIUM CARBONATE (OS-CAL) 600 MG TABS TABLET    Take 600 mg by mouth daily with breakfast.   CLONAZEPAM (KLONOPIN) 0.5 MG TABLET    Take 1/2 tablet by mouth two times daily   FLUTICASONE (FLONASE) 50 MCG/ACT NASAL SPRAY    Place 1 spray into both nostrils 2 (two) times daily as needed for allergies or rhinitis.    FLUTICASONE-SALMETEROL (ADVAIR DISKUS) 250-50 MCG/DOSE AEPB    2 puffs, twice daily   GUAIFENESIN (MUCINEX) 600 MG 12 HR TABLET    Take 1,200  mg by mouth 2 (two) times daily.    IBUPROFEN (ADVIL) 200 MG TABLET    Take 400 mg by mouth every 8 (eight) hours as needed for fever.   INCRUSE ELLIPTA 62.5 MCG/INH AEPB    USE 1 INHALATION DAILY (DISCARD 6 WEEKS AFTER OPENING FOIL TRAY)   MULTIPLE VITAMIN (MULTIVITAMIN WITH MINERALS) TABS    Take 1 tablet by mouth every morning.   ROSUVASTATIN (CRESTOR) 10 MG TABLET    Take 1 tablet (10 mg total) by mouth daily.   VITAMIN C (ASCORBIC ACID) 500 MG TABLET    Take 500 mg by mouth every morning.  Modified Medications   No medications on file  Discontinued Medications   No medications on file

## 2016-08-18 ENCOUNTER — Other Ambulatory Visit

## 2016-08-18 DIAGNOSIS — R0609 Other forms of dyspnea: Secondary | ICD-10-CM

## 2016-08-18 DIAGNOSIS — E785 Hyperlipidemia, unspecified: Secondary | ICD-10-CM

## 2016-08-18 DIAGNOSIS — R0600 Dyspnea, unspecified: Secondary | ICD-10-CM

## 2016-08-18 DIAGNOSIS — R9389 Abnormal findings on diagnostic imaging of other specified body structures: Secondary | ICD-10-CM

## 2016-08-18 DIAGNOSIS — I700 Atherosclerosis of aorta: Secondary | ICD-10-CM

## 2016-08-18 LAB — COMPREHENSIVE METABOLIC PANEL
ALT: 30 IU/L (ref 0–32)
AST: 31 IU/L (ref 0–40)
Albumin/Globulin Ratio: 2 (ref 1.2–2.2)
Albumin: 4.3 g/dL (ref 3.6–4.8)
Alkaline Phosphatase: 89 IU/L (ref 39–117)
BUN/Creatinine Ratio: 21 (ref 12–28)
BUN: 15 mg/dL (ref 8–27)
Bilirubin Total: 0.3 mg/dL (ref 0.0–1.2)
CO2: 24 mmol/L (ref 18–29)
Calcium: 9.3 mg/dL (ref 8.7–10.3)
Chloride: 102 mmol/L (ref 96–106)
Creatinine, Ser: 0.72 mg/dL (ref 0.57–1.00)
GFR calc Af Amer: 104 mL/min/{1.73_m2} (ref >59)
GFR calc non Af Amer: 90 mL/min/{1.73_m2} (ref >59)
Globulin, Total: 2.1 g/dL (ref 1.5–4.5)
Glucose: 86 mg/dL (ref 65–99)
Potassium: 4.6 mmol/L (ref 3.5–5.2)
Sodium: 142 mmol/L (ref 134–144)
Total Protein: 6.4 g/dL (ref 6.0–8.5)

## 2016-08-18 LAB — LIPID PANEL
Chol/HDL Ratio: 2.4 ratio (ref 0.0–4.4)
Cholesterol, Total: 162 mg/dL (ref 100–199)
HDL: 68 mg/dL (ref >39)
LDL Calculated: 80 mg/dL (ref 0–99)
Triglycerides: 70 mg/dL (ref 0–149)
VLDL Cholesterol Cal: 14 mg/dL (ref 5–40)

## 2016-08-25 ENCOUNTER — Ambulatory Visit (INDEPENDENT_AMBULATORY_CARE_PROVIDER_SITE_OTHER): Admitting: Cardiology

## 2016-08-25 DIAGNOSIS — R9389 Abnormal findings on diagnostic imaging of other specified body structures: Secondary | ICD-10-CM

## 2016-08-25 DIAGNOSIS — I700 Atherosclerosis of aorta: Secondary | ICD-10-CM

## 2016-08-25 DIAGNOSIS — E785 Hyperlipidemia, unspecified: Secondary | ICD-10-CM

## 2016-08-25 DIAGNOSIS — R0600 Dyspnea, unspecified: Secondary | ICD-10-CM

## 2016-08-25 DIAGNOSIS — R938 Abnormal findings on diagnostic imaging of other specified body structures: Secondary | ICD-10-CM | POA: Diagnosis not present

## 2016-08-25 DIAGNOSIS — R0609 Other forms of dyspnea: Secondary | ICD-10-CM

## 2016-08-25 MED ORDER — ROSUVASTATIN CALCIUM 10 MG PO TABS: 10.0000 mg | ORAL_TABLET | ORAL | 3 refills | Status: DC

## 2016-08-25 NOTE — Patient Instructions (Addendum)
Medication Instructions:   START TAKING CRESTOR 10 MG BY MOUTH THREE TIMES A WEEK    Labwork:  PRIOR TOO YOUR ONE YEAR FOLLOW-UP APPOINTMENT WITH DR Meda Coffee TO CHECK---CMET AND LIPIDS---PLEASE COME FASTING TO THIS LAB APPOINTMENT     Follow-Up:  Your physician wants you to follow-up in: [place] will receive a reminder letter in the mail two months in advance. If you don't receive a letter, please call our office to schedule the follow-up appointment.  PLEASE HAVE YOUR LABS DONE A WEEK PRIOR TOO THIS APPOINTMENT        If you need a refill on your cardiac medications before your next appointment, please call your pharmacy.

## 2016-08-25 NOTE — Progress Notes (Signed)
Cardiology Office Note    Date:  08/25/2016   ID:  Katrina Campbell, DOB 12/16/1955, MRN [id number]  PCP:  Unk Pinto, MD  Cardiologist:   Ena Dawley, MD  Referring physician:  Alesia Richards, MD   Chief complain: DOE, calcifications on chest CT  History of Present Illness:  Katrina Campbell is a 61 y.o. female with prior medical history of COPD depression hyperlipidemia who has been referred to [place] by her primary care physician for incidental finding of coronary artery calcification on chest CT performed for finding of pulmonary nodule. Patient has been in fairly good health, she was previously told she has high cholesterol but didn't want to take cholesterol medicine. She worked in [employer] and smoked until 4 years ago. In February of this year she lost her only daughter due to drug overdose and is currently going through major grief and depression with anxiety attacks and chest pain. She has always been very active and exercises on treadmill but lately she has noticed dyspnea on exertion when she walks on a treadmill. She denies palpitations or syncope. In her family her mother had CHF and died at age of 60 father had CHF and died at age of 63 both of her brother and sister died of lung disease. She denies any lower extremity edema no orthopnea or paroxysmal nocturnal dyspnea.  08/25/2016 - 2 months follow-up, the patient underwent exercise nuclear stress test with excellent functional capacity, normal LVEF and no ischemia. She was started on Crestor 10 mg daily and her cholesterol improved significantly, triglycerides improved from 191 -> 70, LDL improved from 133 -> 80. She feels that Crestor makes her slightly tired, she continues to exercise every day 20 minutes on a treadmill. She has changed her diet and tries to limit fried food and eat more fresh fruit.  Past Medical History:  Diagnosis Date  . Anxiety   . COPD (chronic obstructive pulmonary disease) ([place])   .  Depression   . Hyperlipidemia   . Labile hypertension    Past Surgical History:  Procedure Laterality Date  . NASAL SINUS SURGERY  1986   Current Medications: Outpatient Medications Prior to Visit  Medication Sig Dispense Refill  . albuterol (PROAIR HFA) 108 (90 BASE) MCG/ACT inhaler Inhale 2 puffs into the lungs every 6 (six) hours as needed for wheezing or shortness of breath. 18 g 3  . aspirin 81 MG tablet Take 81 mg by mouth daily.    Marland Kitchen buPROPion (WELLBUTRIN XL) 300 MG 24 hr tablet TAKE 1 TABLET DAILY 90 tablet 0  . calcium carbonate (OS-CAL) 600 MG TABS tablet Take 600 mg by mouth daily with breakfast.    . clonazePAM (KLONOPIN) 0.5 MG tablet Take 1/2 tablet by mouth two times daily 30 tablet 5  . fluticasone (FLONASE) 50 MCG/ACT nasal spray Place 1 spray into both nostrils 2 (two) times daily as needed for allergies or rhinitis. 16 g 3  . Fluticasone-Salmeterol (ADVAIR DISKUS) 250-50 MCG/DOSE AEPB 2 puffs, twice daily 180 each 1  . Fluticasone-Salmeterol (ADVAIR DISKUS) 250-50 MCG/DOSE AEPB Inhale 1 puff into the lungs 2 (two) times daily. 2 each 0  . guaiFENesin (MUCINEX) 600 MG 12 hr tablet Take 1,200 mg by mouth 2 (two) times daily.     Marland Kitchen ibuprofen (ADVIL) 200 MG tablet Take 400 mg by mouth every 8 (eight) hours as needed for fever.    . INCRUSE ELLIPTA 62.5 MCG/INH AEPB USE 1 INHALATION DAILY (DISCARD 6 WEEKS AFTER OPENING FOIL  TRAY) 90 each 1  . Multiple Vitamin (MULTIVITAMIN WITH MINERALS) TABS Take 1 tablet by mouth every morning.    . vitamin C (ASCORBIC ACID) 500 MG tablet Take 500 mg by mouth every morning.    . rosuvastatin (CRESTOR) 10 MG tablet Take 1 tablet (10 mg total) by mouth daily. 90 tablet 1   No facility-administered medications prior to visit.     Allergies:   Patient has no known allergies.   Social History   Social History  . Marital status: Married    Spouse name: N/A  . Number of children: N/A  . Years of education: N/A   Social History Main  Topics  . Smoking status: Former Smoker    Packs/day: 1.00    Years: 40.00    Types: Cigarettes    Quit date: 11/09/2012  . Smokeless tobacco: Current User    Last attempt to quit: 11/02/2012     Comment: vapor  . Alcohol use 25.2 oz/week    42 Cans of beer per week  . Drug use: No  . Sexual activity: Not on file   Other Topics Concern  . Not on file   Social History Narrative  . No narrative on file    Family History:  The patient's family history includes Asthma in her other; Congestive Heart Failure in her father and mother; Emphysema in her mother; Heart attack in her father and mother; Lung disease in her brother and sister.   ROS:   Please see the history of present illness.    ROS All other systems reviewed and are negative.  PHYSICAL EXAM:   VS:  BP 134/76   Pulse 68   Ht 5\' 4"  (1.626 m)   Wt 135 lb (61.2 kg)   BMI 23.17 kg/m    GEN: Well nourished, well developed, in no acute distress  HEENT: normal  Neck: no JVD, carotid bruits, or masses Cardiac: RRR; no murmurs, rubs, or gallops,no edema  Respiratory:  clear to auscultation bilaterally, normal work of breathing GI: soft, nontender, nondistended, + BS MS: no deformity or atrophy  Skin: warm and dry, no rash Neuro:  Alert and Oriented x 3, Strength and sensation are intact Psych: euthymic mood, full affect  Wt Readings from Last 3 Encounters:  08/25/16 135 lb (61.2 kg)  06/30/16 134 lb 8 oz (61 kg)  06/28/16 135 lb (61.2 kg)    Studies/Labs Reviewed:   EKG:  EKG is ordered today.  The EKG was personally reviewed and showed normal sinus rhythm normal EKG.  Recent Labs: 12/30/2015: Hemoglobin 13.8; Magnesium 2.1; Platelets 158; TSH 3.08 08/18/2016: ALT 30; BUN 15; Creatinine, Ser 0.72; Potassium 4.6; Sodium 142   Lipid Panel    Component Value Date/Time   CHOL 162 08/18/2016 0757   TRIG 70 08/18/2016 0757   HDL 68 08/18/2016 0757   CHOLHDL 2.4 08/18/2016 0757   CHOLHDL 3.4 12/30/2015 1536   VLDL 38  (H) 12/30/2015 1536   LDLCALC 80 08/18/2016 0757   Additional studies/ records that were reviewed today include:   Chest CT performed 03/30/2017 1. Resolution of previously described nodule along the right minor fissure. 2. Advanced emphysema, without acute superimposed process. 3. Age advanced coronary artery atherosclerosis. Recommend assessment of coronary risk factors and consideration of medical therapy. 4. Borderline to mild ascending aortic aneurysm, unchanged.    ASSESSMENT:    1. Abnormal CT of the chest   2. Hyperlipidemia, unspecified hyperlipidemia type   3. Atherosclerosis of aorta (  [place])   4. DOE (dyspnea on exertion)     PLAN:  In order of problems listed above:  1. The patient has symptoms of typical exertional dyspnea and evidence of coronary calcification in the proximal portions of all 3 coronary arteries seen on chest CT did have personally reviewed. Exercise nuclear stress test showed great functional capacity and no ischemia.. 2. Coronary calcification on chest CT - the patient had LDL 133 in September 2017 and evidence of heavy coronary calcification on chest CT, she was started on rosuvastatin 10 mg daily and repeat lipids show excellent improvement in LDL and triglycerides however mild fatigue, though decreased crests are to 10 mg 3 times a week.  3. Hyperlipidemia - as above  Follow up in one year with CMP and lipids.   Medication Adjustments/Labs and Tests Ordered: Current medicines are reviewed at length with the patient today.  Concerns regarding medicines are outlined above.  Medication changes, Labs and Tests ordered today are listed in the Patient Instructions below. There are no Patient Instructions on file for this visit.   Signed, Ena Dawley, MD  08/25/2016 9:35 AM    [facility] [address], [place], [place]  [address] Phone: [phone]; Fax: [phone]

## 2016-08-31 LAB — HM MAMMOGRAPHY

## 2016-09-02 ENCOUNTER — Encounter: Payer: Self-pay | Admitting: Family Medicine

## 2016-10-14 ENCOUNTER — Telehealth: Payer: Self-pay | Admitting: Physician Assistant

## 2016-10-14 MED ORDER — FLUTICASONE PROPIONATE 50 MCG/ACT NA SUSP: 1.0000 | Freq: Two times a day (BID) | NASAL | 0 refills | Status: DC | PRN

## 2016-10-14 NOTE — Telephone Encounter (Signed)
-----   Message from Elenor Quinones, [place] sent at 10/13/2016  2:30 PM EDT ----- Regarding: refill question Pt called for refill on Flonase & her last office visit with our office was on 12-30-15 with you.  She has no future visits with our office but she does have a CPE w/ a new provider on 12-28-2016.  Please advise?

## 2016-10-14 NOTE — Telephone Encounter (Signed)
Pt was made aware of Rx that was sent to the pharmacy & that she would need to get her refills from her new provider.  Pt agreed & hung up

## 2016-10-31 ENCOUNTER — Other Ambulatory Visit: Payer: Self-pay | Admitting: Pulmonary Disease

## 2016-12-28 ENCOUNTER — Encounter: Payer: Self-pay | Admitting: Family Medicine

## 2016-12-28 ENCOUNTER — Ambulatory Visit (INDEPENDENT_AMBULATORY_CARE_PROVIDER_SITE_OTHER): Admitting: Family Medicine

## 2016-12-28 VITALS — BP 132/80 | HR 78 | Temp 98.1°F | Resp 16 | Ht 64.0 in | Wt 141.0 lb

## 2016-12-28 DIAGNOSIS — M8580 Other specified disorders of bone density and structure, unspecified site: Secondary | ICD-10-CM | POA: Diagnosis not present

## 2016-12-28 DIAGNOSIS — Z124 Encounter for screening for malignant neoplasm of cervix: Secondary | ICD-10-CM | POA: Diagnosis not present

## 2016-12-28 DIAGNOSIS — Z0000 Encounter for general adult medical examination without abnormal findings: Secondary | ICD-10-CM | POA: Diagnosis not present

## 2016-12-28 DIAGNOSIS — J432 Centrilobular emphysema: Secondary | ICD-10-CM

## 2016-12-28 DIAGNOSIS — F320 Major depressive disorder, single episode, mild: Secondary | ICD-10-CM | POA: Diagnosis not present

## 2016-12-28 DIAGNOSIS — E785 Hyperlipidemia, unspecified: Secondary | ICD-10-CM | POA: Diagnosis not present

## 2016-12-28 MED ORDER — CALCIUM-VITAMIN D 500-400 MG-UNIT PO TABS: ORAL_TABLET | ORAL | Status: DC

## 2016-12-28 NOTE — Assessment & Plan Note (Signed)
Discussed co enzyme q10 to take with her crestor TIW Check labs, I think she has missed more doses at end of visit state she was not doing well with the crestor

## 2016-12-28 NOTE — Patient Instructions (Addendum)
Coenzyme q 10 can abe taken with the Crestor  We will call with lab results  Call if you would like referral for therapy F/U 6 months

## 2016-12-28 NOTE — Progress Notes (Signed)
Subjective:    Patient ID: Katrina Campbell, female    DOB: 14-Aug-1955, 61 y.o.   MRN: [id number]  Patient presents for New Patient CPE (is fasting)  Patient here to establish care and for Physical. She was previous patient of [facility] She is also followed by Dr. Lenna Gilford pulmonology secondary to her emphysema/COPD. She does have history of pneumonia as well as pulmonary nodules which are being followed. She is currently on Advair, Incruse and  albuterol as needed  Seasonal alergies   Dr. Meda Coffee cardiology- she was noted to have coronary artery calcifications and a CT scan. She had nuclear stress test with no evidence of any ischemia. She was started on Crestor 10 mg daily for her hyperlipidemia, had repeat in MAY and told to change to three times a week   Chart reveals that she lost her daughter to a drug overdose in February of this year. She's been on clonazepam and WellbutrinShe actually stopped the clonazepam as it was making her feel too drugged up in feeling that the next day. She is still on the Wellbutrin which she is actually been on since her 39s initially help her quit smoking and to help with her depression  Dermatology Dr. Allyson Sabal- full body check for moles yearly   Colonoscopy-UTD Mammogram- UTD PAP Smear- Due last in April 2015, normal Bone Density- Osteopenia 2016, had repeated his year   Optometry- [facility]   Immunizations- Declines    Review Of Systems:  GEN- denies fatigue, fever, weight loss,weakness, recent illness HEENT- denies eye drainage, change in vision, nasal discharge, CVS- denies chest pain, palpitations RESP- denies SOB, cough, wheeze ABD- denies N/V, change in stools, abd pain GU- denies dysuria, hematuria, dribbling, incontinence MSK- denies joint pain, muscle aches, injury Neuro- denies headache, dizziness, syncope, seizure activity       Objective:    BP 132/80   Pulse 78   Temp 98.1 F (36.7 C) (Oral)   Resp 16   Ht 5\' 4"  (1.626 m)   Wt 141  lb (64 kg)   SpO2 98%   BMI 24.20 kg/m  GEN- NAD, alert and oriented x3 HEENT- PERRL, EOMI, non injected sclera, pink conjunctiva, MMM, oropharynx clear Neck- Supple, no thyromegaly CVS- RRR, no murmur RESP-CTAB ABD-NABS,soft,NT,ND GU- normal external genitalia, vaginal mucosa pink and moist, cervix visualized no growth, no blood form os - until after pap then mild bleeding,no discharge, no CMT, no ovarian masses, uterus normal size Psych- tearful discussing daughter, not anxious no SI EXT- No edema Pulses- Radial, DP- 2+        Assessment & Plan:      Problem List Items Addressed This Visit      Unprioritized   Osteopenia    Vitamin  D, weight bearing exercise      Relevant Orders   Vitamin D, 25-hydroxy   Hyperlipidemia    Discussed co enzyme q10 to take with her crestor TIW Check labs, I think she has missed more doses at end of visit state she was not doing well with the crestor       Relevant Orders   Lipid panel   Depression    Continue Wellbutrin      COPD (chronic obstructive pulmonary disease) (HCC)    Followed by pulmonary.       Other Visit Diagnoses    Routine general medical examination at a health care facility    -  Primary   CPE done, PAP smear done if normal none further,  declines immunizations, obtain recent bone density, check vitamin D level   Relevant Orders   CBC with Differential/Platelet   Comprehensive metabolic panel   Cervical cancer screening       Relevant Orders   Pap IG w/ reflex to HPV when ASC-U      Note: This dictation was prepared with Dragon dictation along with smaller phrase technology. Any transcriptional errors that result from this process are unintentional.

## 2016-12-28 NOTE — Assessment & Plan Note (Signed)
Vitamin  D, weight bearing exercise

## 2016-12-28 NOTE — Assessment & Plan Note (Signed)
Followed by pulmonary 

## 2016-12-28 NOTE — Assessment & Plan Note (Signed)
-   Continue Wellbutrin 

## 2016-12-29 ENCOUNTER — Encounter: Payer: Self-pay | Admitting: *Deleted

## 2016-12-29 ENCOUNTER — Other Ambulatory Visit: Payer: Self-pay | Admitting: *Deleted

## 2016-12-29 ENCOUNTER — Encounter: Payer: Self-pay | Admitting: Physician Assistant

## 2016-12-29 LAB — COMPREHENSIVE METABOLIC PANEL
AG Ratio: 1.9 (calc) (ref 1.0–2.5)
ALKALINE PHOSPHATASE (APISO): 81 U/L (ref 33–130)
ALT: 26 U/L (ref 6–29)
AST: 25 U/L (ref 10–35)
Albumin: 4.4 g/dL (ref 3.6–5.1)
BILIRUBIN TOTAL: 0.5 mg/dL (ref 0.2–1.2)
BUN: 16 mg/dL (ref 7–25)
CALCIUM: 9.1 mg/dL (ref 8.6–10.4)
CO2: 27 mmol/L (ref 20–32)
Chloride: 106 mmol/L (ref 98–110)
Creat: 0.82 mg/dL (ref 0.50–0.99)
Globulin: 2.3 g/dL (calc) (ref 1.9–3.7)
Glucose, Bld: 88 mg/dL (ref 65–99)
Potassium: 4.5 mmol/L (ref 3.5–5.3)
Sodium: 141 mmol/L (ref 135–146)
Total Protein: 6.7 g/dL (ref 6.1–8.1)

## 2016-12-29 LAB — PAP IG W/ RFLX HPV ASCU

## 2016-12-29 LAB — CBC WITH DIFFERENTIAL/PLATELET
BASOS PCT: 0.3 %
Basophils Absolute: 18 cells/uL (ref 0–200)
Eosinophils Absolute: 102 cells/uL (ref 15–500)
Eosinophils Relative: 1.7 %
HEMATOCRIT: 41.8 % (ref 35.0–45.0)
HEMOGLOBIN: 14.1 g/dL (ref 11.7–15.5)
LYMPHS ABS: 2286 {cells}/uL (ref 850–3900)
MCH: 30.5 pg (ref 27.0–33.0)
MCHC: 33.7 g/dL (ref 32.0–36.0)
MCV: 90.3 fL (ref 80.0–100.0)
MPV: 11.8 fL (ref 7.5–12.5)
Monocytes Relative: 6.9 %
NEUTROS ABS: 3180 {cells}/uL (ref 1500–7800)
Neutrophils Relative %: 53 %
PLATELETS: 145 10*3/uL (ref 140–400)
RBC: 4.63 10*6/uL (ref 3.80–5.10)
RDW: 12.7 % (ref 11.0–15.0)
Total Lymphocyte: 38.1 %
WBC: 6 10*3/uL (ref 3.8–10.8)
WBCMIX: 414 {cells}/uL (ref 200–950)

## 2016-12-29 LAB — LIPID PANEL
CHOLESTEROL: 193 mg/dL (ref <200)
HDL: 73 mg/dL (ref >50)
LDL CHOLESTEROL (CALC): 101 mg/dL — AB
Non-HDL Cholesterol (Calc): 120 mg/dL (calc) (ref <130)
TRIGLYCERIDES: 94 mg/dL (ref <150)
Total CHOL/HDL Ratio: 2.6 (calc) (ref <5.0)

## 2016-12-29 LAB — VITAMIN D 25 HYDROXY (VIT D DEFICIENCY, FRACTURES): Vit D, 25-Hydroxy: 53 ng/mL (ref 30–100)

## 2016-12-29 MED ORDER — FLUTICASONE PROPIONATE 50 MCG/ACT NA SUSP: 1.0000 | Freq: Two times a day (BID) | NASAL | 0 refills | Status: DC | PRN

## 2017-01-02 ENCOUNTER — Ambulatory Visit: Payer: TRICARE For Life (TFL) | Admitting: Pulmonary Disease

## 2017-01-03 ENCOUNTER — Encounter: Payer: Self-pay | Admitting: *Deleted

## 2017-01-05 ENCOUNTER — Other Ambulatory Visit: Payer: Self-pay | Admitting: Physician Assistant

## 2017-01-12 ENCOUNTER — Other Ambulatory Visit: Payer: Self-pay | Admitting: Family Medicine

## 2017-01-12 MED ORDER — BUPROPION HCL ER (XL) 300 MG PO TB24: 300.0000 mg | ORAL_TABLET | Freq: Every day | ORAL | 1 refills | Status: DC

## 2017-01-24 ENCOUNTER — Encounter: Payer: Self-pay | Admitting: Physician Assistant

## 2017-03-08 ENCOUNTER — Ambulatory Visit (HOSPITAL_COMMUNITY)
Admission: EM | Admit: 2017-03-08 | Discharge: 2017-03-08 | Disposition: A | Discharge status: Home / Self Care | Attending: Family Medicine | Admitting: Family Medicine

## 2017-03-08 ENCOUNTER — Encounter (HOSPITAL_COMMUNITY): Payer: Self-pay | Admitting: Emergency Medicine

## 2017-03-08 ENCOUNTER — Telehealth: Payer: Self-pay | Admitting: Pulmonary Disease

## 2017-03-08 ENCOUNTER — Ambulatory Visit (INDEPENDENT_AMBULATORY_CARE_PROVIDER_SITE_OTHER)

## 2017-03-08 DIAGNOSIS — M25572 Pain in left ankle and joints of left foot: Secondary | ICD-10-CM | POA: Diagnosis not present

## 2017-03-08 MED ORDER — DICLOFENAC SODIUM 75 MG PO TBEC: 75.0000 mg | DELAYED_RELEASE_TABLET | Freq: Two times a day (BID) | ORAL | 0 refills | Status: DC

## 2017-03-08 NOTE — Telephone Encounter (Signed)
Spoke with the pt  She is c/o cough x 2 wks with green sputum  She is also c/o chest congestion and minimal wheezing  She has not been seen since march 2018  OV with SN at 9:30 am tomorrow

## 2017-03-08 NOTE — ED Triage Notes (Signed)
Pt sts left ankle pain starting 3 days ago; pt sts some swelling and hx of same with fx in past

## 2017-03-08 NOTE — Discharge Instructions (Signed)
You may use over the counter ibuprofen or acetaminophen as needed.  ° °

## 2017-03-09 ENCOUNTER — Encounter: Payer: Self-pay | Admitting: Pulmonary Disease

## 2017-03-09 ENCOUNTER — Ambulatory Visit (INDEPENDENT_AMBULATORY_CARE_PROVIDER_SITE_OTHER): Admitting: Pulmonary Disease

## 2017-03-09 ENCOUNTER — Ambulatory Visit: Admitting: Physician Assistant

## 2017-03-09 VITALS — BP 128/84 | HR 67 | Temp 98.0°F | Ht 64.0 in | Wt 142.6 lb

## 2017-03-09 DIAGNOSIS — J441 Chronic obstructive pulmonary disease with (acute) exacerbation: Secondary | ICD-10-CM | POA: Insufficient documentation

## 2017-03-09 DIAGNOSIS — I712 Thoracic aortic aneurysm, without rupture, unspecified: Secondary | ICD-10-CM

## 2017-03-09 DIAGNOSIS — R9389 Abnormal findings on diagnostic imaging of other specified body structures: Secondary | ICD-10-CM

## 2017-03-09 DIAGNOSIS — J432 Centrilobular emphysema: Secondary | ICD-10-CM

## 2017-03-09 DIAGNOSIS — I700 Atherosclerosis of aorta: Secondary | ICD-10-CM

## 2017-03-09 DIAGNOSIS — R0989 Other specified symptoms and signs involving the circulatory and respiratory systems: Secondary | ICD-10-CM

## 2017-03-09 DIAGNOSIS — F341 Dysthymic disorder: Secondary | ICD-10-CM | POA: Insufficient documentation

## 2017-03-09 MED ORDER — METHYLPREDNISOLONE 4 MG PO TBPK: ORAL_TABLET | ORAL | 0 refills | Status: DC

## 2017-03-09 MED ORDER — LEVOFLOXACIN 500 MG PO TABS: 500.0000 mg | ORAL_TABLET | Freq: Every day | ORAL | 0 refills | Status: DC

## 2017-03-09 NOTE — Patient Instructions (Signed)
Today we updated your med list in our EPIC system...    Continue your current medications the same...  We wrote for an antibiotic- LEVAQUIN 500mg  one tab daily til gone...  And a MEDROL Dosepak for the inflammation-- take as directed on the pack...  Rest at home, lots of fluids, Tylenol as needed...  Call for any questions...   Let's plan a follow up eval this spring, and remember>>  NO SHOVELING SNOW!!!

## 2017-03-09 NOTE — ED Provider Notes (Signed)
[facility]   [id number] 03/08/17 Arrival Time: 6213  ASSESSMENT & PLAN:  1. Acute left ankle pain     Meds ordered this encounter  Medications  . diclofenac (VOLTAREN) 75 MG EC tablet    Sig: Take 1 tablet (75 mg total) by mouth 2 (two) times daily.    Dispense:  14 tablet    Refill:  0   Possibly tendonitis. Will f/u in several days with PCP or here if not showing improvement. Reviewed expectations re: course of current medical issues. Questions answered. Outlined signs and symptoms indicating need for more acute intervention. Patient verbalized understanding. After Visit Summary given.   SUBJECTIVE:  Katrina Campbell is a 61 y.o. female who presents with complaint of pain of her left lateral ankle. Onset approx 3 days ago. Doesn't remember injury. Some swelling. No extremity sensation changes or weakness. Certain movements exacerbate discomfort. Able to bear weight with discomfort. No OTC treatment.  ROS: As per HPI.   OBJECTIVE:  Vitals:   03/08/17 1359  BP: 138/71  Pulse: 73  Resp: 18  Temp: 98.8 F (37.1 C)  TempSrc: Oral  SpO2: 98%    General appearance: alert; no distress Extremities: L ankle with lateral swelling and tenderness over posterior lateral malleolus; FROM with discomfort; no bruising Skin: warm and dry Neurologic: normal gait; normal symmetric reflexes Psychological: alert and cooperative; normal mood and affect  Imaging: Dg Ankle Complete Left  Result Date: 03/08/2017 CLINICAL DATA:  Lateral ankle swelling.  No known injury. EXAM: LEFT ANKLE COMPLETE - 3+ VIEW COMPARISON:  None. FINDINGS: Soft tissue swelling is present over the lateral malleolus. There is no underlying fracture. The joint is located. No focal osseous abnormality present. IMPRESSION: Soft tissue swelling over the lateral malleolus without underlying fracture. This may related to edema or focal soft tissue injury. Electronically Signed   By: San Morelle M.D.   On:  03/08/2017 15:12    No Known Allergies  Past Medical History:  Diagnosis Date  . Anxiety   . COPD (chronic obstructive pulmonary disease) ([place])   . Depression   . Hyperlipidemia   . Labile hypertension    Social History   Socioeconomic History  . Marital status: Married    Spouse name: Not on file  . Number of children: Not on file  . Years of education: Not on file  . Highest education level: Not on file  Social Needs  . Financial resource strain: Not on file  . Food insecurity - worry: Not on file  . Food insecurity - inability: Not on file  . Transportation needs - medical: Not on file  . Transportation needs - non-medical: Not on file  Occupational History  . Not on file  Tobacco Use  . Smoking status: Former Smoker    Packs/day: 1.00    Years: 40.00    Pack years: 40.00    Types: Cigarettes    Last attempt to quit: 11/09/2012    Years since quitting: 4.3  . Smokeless tobacco: Never Used  . Tobacco comment: vapor  Substance and Sexual Activity  . Alcohol use: Yes    Alcohol/week: 14.4 oz    Types: 24 Cans of beer per week  . Drug use: No  . Sexual activity: Not Currently  Other Topics Concern  . Not on file  Social History Narrative  . Not on file   Family History  Problem Relation Age of Onset  . Heart attack Mother   . Emphysema Mother   .  Congestive Heart Failure Mother   . Heart attack Father   . Congestive Heart Failure Father   . Asthma Other   . Lung disease Sister   . Lung disease Brother   . Colon cancer Neg Hx    Past Surgical History:  Procedure Laterality Date  . NASAL SINUS SURGERY  1986     Vanessa Kick, MD 03/09/17 [phone]

## 2017-03-09 NOTE — Progress Notes (Signed)
Subjective:     Patient ID: Katrina Campbell, female   DOB: 20-Sep-1955, 61 y.o.   MRN: [id number]  HPI 61 y/o WF, former smoker who quit in 2014, w/ COPD/Emphysema (GOLD Stage 2), Hx LUL pneumonia 10/16 that was slow to clear, f/u CT Chest 2/17 w/ resolution of LUL changes (scarring) but coronary & Aortic atherosclerotic calcif, mild centrilob & pqaraseptal emphysema, & a new 66mm nodule along minor fissure => needs f/u...  ~  February 10, 2015:  Initial pulmonary consult by Katrina Campbell>  69 y/o WF, referred by Katrina Campbell & Dr. Glenda Campbell for an abn CXR>      Katrina Campbell is an ex-smoker, having started in her teens and smoked for ~45 yrs up to 2ppd, for an est 44 pack-yr smoking hx (she quit in 2014 because her fingers were purple);  She carries the Dx of underlying COPD & was placed on Advair250, also tried Incruse x 38mo but she stopped this med;  She describes a developing cough, prod of a small amt of green phlegm (no blood), assoc w/ increased SOB and sharp left upper CP, and decr energy "I was past going";  She went to urgent care where a CXR showed a left upper lobe opac "I was treated for pneumonia w/ 2 shots & Levaquin x 10d";  She subseq saw Katrina Mutters PA at [facility] office who ordered a CT Chest (see below).  Smoking Hx>  As above  Pulmonary Hx>  She has a hx of sinusitis w/ surg x2;  She has had occas bouts of bronchitis requiring antibiotics & pneumonia in 2015 treated w/ antibiotic & pred w/ resolution;  No hx asthma, no hx TB or known exposure;  She admits to mild cough, some chest congestion, but denied SOB/DOE before the last 2-3 yrs...  Medical Hx>  HBP (but not requiring meds now), HL (on diet alone), anxiety/ depression (on WellbutrinXL).Marland KitchenMarland Kitchen  Family Hx>  Father was a smoker w/ emphysema & died in his 23's w/ CHF; mother died at 21 w/ CHF; all 3 sibs have lung dis- 1Bro died 75'd w/ lung cancer, 1Sis died w/ pneumonia, 1Sis alive w/ COPD...  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  Current Meds>  Advair250, AlbuterolHFA, Mucinex, ASA81, MVIs, WellbutrinXL300... EXAM shows Afeb, VSS, O2sat=98% on RA at rest;  HEENT- neg, mallampati1;  Chest- decr BS at bases, w/o w/r/r;  Heart- RR w/o m/r/g;  Abd- soft, neg;  Ext- w/o c/c/e;  Neuro- intact...   CXR 07/19/12 showed norm heart size, COPD w/ hyperinflation, resolved prev lingular infiltrate- now clear/ NAD...   CT Chest 01/22/14 done at [facility] showed athersclerotic calcif in Ao & branch vessels including the coronaries, no adenopathy, COPD changes noted, no pulm nodules, min RML atx  LABS 9/16 in Epic>  Chems- wnl;  CBC- wnl;  Norm VitD, B12, & Iron;  TSH=3.82...  CXR 01/25/15 at [facility] showed norm heart size, underlying COPD w/ hyperinflation, 2.6cm density LUL medially- infiltrate vs mass...  CT Chest performed 01/27/15 [facility] showed left apical (medial/post) opacity assoc w/ centrilob emphysema changes & scattered areas of scarring  Spirometry 02/10/15 showed FVC=2.98 (97%), FEV1=1.59 (66%), %1sec=54%, mid-flows reduced at 33% predicted... c/w mod severe airflow obstruction & GOLD Stage2 COPD.  Ambulatory Oxygen saturation test on RA 02/10/15>  O2sat=97% w/ pulse=62/min;  She ambulated 3 laps w/ lowest O2sat=84% w/ pulse=102/min (SOB during a cough  paroxysm)...  IMP/PLAN>>  Katrina Campbell has mod COPD & quit smoking 2014;  She had a lingular pneumonia in 2015 which resolved to baseline after antibiotics and pred rx;  She presented w/ acute LUL symptoms c/w pneumonia and an abn CXR=> slow to resolve after antibiotic rx;  I told her that in my opinion we should give it a little longer to see if this represents XRay lag time- rec to take AUGMENTIN875Bid/Align, continue MUCINEX(2Bid)/ fluids, continue ADVAIR250Bid, and restart the INCRUSE daily;  We plan ROV in 3wks w/ CXR & to consider timing of a repeat CT, consider PET, etc....    ~  March 11, 2015:  65mo ROV w/ Katrina Campbell>  Katrina Campbell reports that she is breathing sl better- notes sl congestion, sl sore throat, sl cough/ beige phlegm, sl left scapular pain, no f/c/s=> we discussed DukesMMW for throat & continue meds + Mucinex 1200mg Bid w/ fluids...     COPD/ Emphysema> on Advair250Bid, Incruse daily, Mucinex 600- 1to2Bid    Hx LUL pneumonia w/ left apical opac on CT 01/2015 slow to clear>     Ex-smoker w/ 65+pack-yr hx>  She quit smoking in 2014...    Hx sinusitis w/ surg x2 in the past EXAM shows Afeb, VSS, O2sat=99% on RA at rest;  HEENT- neg, mallampati1;  Chest- decr BS at bases, sl congested cough, w/o w/r/r;  Heart- RR w/o m/r/g;  Abd- soft, neg;  Ext- w/o c/c/e;  Neuro- intact...   CXR 03/11/15 showed norm heart size, hyperinflation w/ interstitial coarsening, NAD...  IMP/PLAN>>  Katrina Campbell has Stage 2 COPD/ emphysema & prev LUL pneumonia w/ CT Chest 01/2015 showing left apical opacity (slow to clear); she has quit smoking (2014), takes Advair250Bid, Incruse daily, Mucinex, and we discussed need for incr exercise program... CXR today w/ COPD/emphysema & left apex looks ok- we decided to wait 20mo & recheck CT at that time...   ~  May 12, 2015:  30mo ROV & Katrina Campbell notes some chr congestion, clear sput, & DOE- she continues on Advbair250, Incruse, Proair, Mucinex, Fluids... It is time for her f/u CT Chest...    COPD/ Emphysema> on Advair250Bid, Incruse daily, Proair, Mucinex 600- 1to2Bid; Mod airflow obstruction w/ GOLD Stage2 COPD    Hx LUL pneumonia w/ left apical opac on CT 01/2015 slow to clear> also had lingular pneumonia 2015    Ex-smoker w/ 65+pack-yr hx>  She quit smoking in 2014...    Hx sinusitis w/ surg x2 in the past    Atherosclerotic calcif in Ao, branch vessels, and coronaries on prev CTs...  EXAM shows Afeb, VSS, O2sat=97% on RA at rest;  HEENT- neg, mallampati1;  Chest- decr BS at bases, sl congested cough, w/o w/r/r;  Heart- RR w/o m/r/g;  Abd- soft, neg;  Ext-  w/o c/c/e;  Neuro- intact  CXR 05/12/15 showed norm heart size, hyperinflation/ clear lungs/ NAD...   CT Chest 05/22/15 showed norm heart size, coronary calcif & atherosclerotic calcif of Ao, no signif adenopathy, mild centrilob & pqaraseptal emphysema, prev left apical opac is resolved w/ mild resid scarring, new 4mm nodule along minor fissure & radiology rec f/u CT in 62yr... IMP/PLAN>>  Left apical opac is resolved, new 43mm nodule in right mid-zone noted; for the cough she will continue the Mucinex and for the Dyspnea- advised again to start exercise program... We plan ROV in 40mo.  ~  November 09, 2015:  2mo ROV & Katrina Campbell has retired from [employer], enjoying retirement but not exercising 7 we discussed  the need for a regular exercise program;  She is concerned about 8# wt gain (her BMI=24) and this provides an ideal situation to encourage an exercise program- we discussed a Gym, the [employer], Yoga, etc... She had some left shoulder pain, saw an orthopedist w/ shot for frozen shoulder which helped some... Her breathing is stable- some DOE w/ exertion, notes occas wheezing, min cough, no sput, etc; she is regular w/ her ADVAIR-250 (2spBid) and Incruse (once daily) she is using Mucinex1200mg /d and AlbutHFA rescue inhaler as needed (once daily on ave); she thinks that her small dog may be part of her prob indoors and we reviewed having a safe room w/ HEPA filter air cleaner to creat a good environment for her to breathe...  EXAM shows Afeb, VSS, O2sat=98% on RA at rest;  HEENT- neg, mallampati1;  Chest- decr BS at bases, clear w/o w/r/r;  Heart- RR w/o m/r/g;  Abd- soft, neg;  Ext- w/o c/c/e;  Neuro- intact  We reviewed prev CXRs & CT Chest scan IMP/PLAN>>  Katrina Campbell is reassured about her breathing; doing satis off cigarettes for 36yrs, taking her inhalers regularly; now needs to incr her exercise program & push the envelope for max improvement- briefly discussed Pulm Rehab but she wants to try it on her own & I  agree; we plan ROV recheck & f/u CT Chest in 57mo... In the meanwhile we reviewed adult vaccine schedule but she is reluctant, says she hasn't taken any vaccines, thinks her sisters vaccination gave her severe pneumonia a few yrs ago; I didn't push the issue but discussed the benefits from Pneumococcal vaccination 7 the flu shots; she will consider these options...   ~  May 19, 2016:  42mo ROV & Katrina Campbell shared the tragic news that her 61 y/o daughter (who lived w/ her) overdosed 2 wks ago & passed away (she had been in [place] x2 w/o help); Katrina Campbell is understandably tearful, can't sleep, notes breathing is worse w/ incr use of her rescue inhaler (and little benefit)... We discussed trial of Klonopin for the dyspnea and anxiety, cautioned not to fall back on old habits and stay away from cigs...     COPD/ Emphysema> on Advair250Bid, Incruse daily, Mucinex 600- 1to2Bid; Proair rescue & Flonase prn; CT Chest showed centrilob & paraseptal emphysema and PFTs showed GOLD Stage 2 disease...    Hx LUL pneumonia w/ left apical opac on CT 01/2015 slow to clear> f/u CT Chest 2/17 showed resolution of LUL changes    68mm pulm nodule on CT Chest 05/2015> located along the minor fissure & f/u due 62yr => f/u CT Chest 05/2016 w/ resolution of the nodule    Ex-smoker w/ 65+pack-yr hx>  She quit smoking in 2014...    Hx sinusitis w/ surg x2 in the past    Cardiac Issues> on ASA81; coronary & aortic calcif seen on CT Chest; prev hx of HBP (not on meds now), HL (not on meds) => CT Chest 05/2016 w/ Asc Ao measure of 4cm, +cardiac risk factors therefore referred to Cardiology to establish.    Medical issues> PCP is DrMcKeown on WellbutrinXL300 to help depression EXAM shows Afeb, VSS, O2sat=97% on RA at rest;  HEENT- neg, mallampati1;  Chest- decr BS at bases, clear w/o w/r/r;  Heart- RR w/o m/r/g;  Abd- soft, neg;  Ext- w/o c/c/e;  Neuro- intact  CT Chest => pending IMP/PLAN>>  Katrina Campbell's family has suffered a terrible event & she  has developed incr dyspnea as a result;  We discussed the benefit & roll of Klonopin in this setting & she will try 0.5mg  1/2 to 1 tab Bid;  She is due for her f/u CT Chest & we will plan brief recheck in 6-8 wks... Note: >50% of this 22min ov was spent in counseling & coordination of care...  ADDENDUM>>  CT CHEST 05/31/16 showed mod emphysema, no adenopathy, and the prev nodule along the right minor fissure has resolved;  Other CT findings include atherosclerotic changes in Ao & coronaries, and some dilatation of ascending Ao measuring ~4cm...  She has several risk factors for CAD w/ prev smoking & elev chol;  EKGs reviewed w/ poor R progression from V1=>3...  REC Cardiac eval to establish & follow going forward => referred to [facility]...  ~  June 30, 2016:  6wk ROV & Katrina Campbell has been using the Klonopin 0.5mg - taking 1/2 tab Qhs but not using it during the day- we reviewed her options for taking this med and the expected benefits... In addition she saw [facility]- DrKNelson 06/23/16 for initial eval> note reviewed CAD seen on CT Chest, HBP (not currently on meds), HL (not on meds), ex-smoker, +FamHx heart dis;  She rec Nuclear stress test, and start med rx w/ Cres10...    COPD/emphysema> on Advair, Incruse, Mucinex, Proair; she is stable w/ DOE, min cough, no sput, no hemoptysis, etc...    Hx LUL pneumonia 10/16 that was slow to clear on CXR> finally resolved and serial CT scans showed resolution w/ persistent COPD/emphysema, atherosclerotic changes & ~4cm dilatation of the asc ao...    Ex-smoker w/ 65+pack-yr hx>  She quit smoking in 2014... EXAM shows Afeb, VSS, O2sat=97% on RA at rest;  HEENT- neg, mallampati1;  Chest- decr BS at bases, clear w/o w/r/r;  Heart- RR w/o m/r/g;  Abd- soft, neg;  Ext- w/o c/c/e;  Neuro- intact  EKG 06/23/16>  NSR, rate85, poor R prog V1-2, min NSSTTWA...   Nuclear Stress test 06/28/16>  Felt to be normal w/ breast attenuation but no ischemia, norm BP response, EF=58%,  normal wall motion...  IMP/PLAN>>  Katrina Campbell is stable- she will continue the statin rx per DrNelson & use the Klonopin as we discussed; stable on the Advair/ Incruse/ etc... we plan rov 32mo, sooner prn breathing problems...   ~  March 09, 2017:  81mo ROV & Katrina Campbell presents w/ a 2wk hx cough chest congestion/ some wheezing, incr SOB, productive of light green sput, trace hemoptysis, denies f/c/s, not resting at night;  She had some recent tendonitis in ankle, given voltaren but had to cut back on exercise => we discussed COPD exac & Rx w/ Levaquin & Medrol Dosepak which has worked well for her in the past... We reviewed the following medical problems during today's office visit>      COPD/ Emphysema> on Advair250Bid, Incruse daily, Mucinex 600- 1to2Bid w/ fluids; Proair rescue & Flonase prn; CT Chest showed centrilob & paraseptal emphysema and PFTs 02/2015 showed GOLD Stage 2 disease (FEV1=1.59 (66%).    Hx LUL pneumonia w/ left apical opac on CT 01/2015 slow to clear> f/u CT Chest 2/17 showed resolution of LUL changes    41mm pulm nodule on CT Chest 05/2015> located along the minor fissure => f/u CT Chest 05/2016 w/ resolution of the nodule    Ex-smoker w/ 65+pack-yr hx>  She quit smoking in 2014...    Hx sinusitis w/ surg x2 in the past    Cardiac Issues> on ASA81; coronary & aortic calcif  seen on CT Chest; prev hx of HBP (not on meds now), HL (not prev on meds), exsmoker & +FamHx => CT Chest 05/2016 w/ Asc Ao measure of 4cm, +cardiac risk factors therefore referred to Cardiology & seen by [facility] 06/2016;  She rec Nuclear stress test done 06/2016 felt to be normal w/ breast attenuation but no ischemia, norm BP response, EF=58%, normal wall motion;  She was started on Crestor10...    Medical issues> PCP is DrMcKeown on WellbutrinXL300 to help depression;  EXAM shows Afeb, VSS, O2sat=97% on RA at rest;  HEENT- neg, mallampati1;  Chest- decr BS at bases, clear w/o w/r/r;  Heart- RR w/o m/r/g;  Abd- soft, neg;   Ext- w/o c/c/e;  Neuro- intact IMP/PLAN>>  Katrina Campbell has a COPD exac & we discussed Levaquin500- Qdx7d and Medrol dosepak;  Asked to continue regular meds diligently, consider incr Mucinex600 to 2Bid + fluids;  When able=> back into exercise program & pay attention to her body in terms of chest discomfort, SOB, etc... We plan routine ROV recheck in 89mo w/ f/u CXR, Spirometry, Labs.    Past Medical History  Diagnosis Date  . Depression >> on WellbutrinXL300   . Anxiety   . COPD (chronic obstructive pulmonary disease) (HCC) >> on Advair250 & Incruse   . Hyperlipidemia   . Labile hypertension >> not current on BP meds      Past Surgical History:  Procedure Laterality Date  . NASAL SINUS SURGERY  1986    Outpatient Encounter Medications as of 03/09/2017  Medication Sig  . albuterol (PROAIR HFA) 108 (90 BASE) MCG/ACT inhaler Inhale 2 puffs into the lungs every 6 (six) hours as needed for wheezing or shortness of breath.  Marland Kitchen aspirin 81 MG tablet Take 81 mg by mouth daily.  Marland Kitchen buPROPion (WELLBUTRIN XL) 300 MG 24 hr tablet Take 1 tablet (300 mg total) by mouth daily.  . Calcium Carb-Cholecalciferol (CALCIUM-VITAMIN D) 500-400 MG-UNIT TABS 1 a day  . fluticasone (FLONASE) 50 MCG/ACT nasal spray Place 1 spray into both nostrils 2 (two) times daily as needed for allergies or rhinitis.  . Fluticasone-Salmeterol (ADVAIR DISKUS) 250-50 MCG/DOSE AEPB Inhale 1 puff into the lungs 2 (two) times daily.  Marland Kitchen guaiFENesin (MUCINEX) 600 MG 12 hr tablet Take 1,200 mg by mouth 2 (two) times daily.   Marland Kitchen ibuprofen (ADVIL) 200 MG tablet Take 400 mg by mouth every 8 (eight) hours as needed for fever.  . INCRUSE ELLIPTA 62.5 MCG/INH AEPB USE 1 INHALATION DAILY (DISCARD 6 WEEKS AFTER OPENING FOIL TRAY)  . MAGNESIUM PO Take by mouth daily.  . Multiple Vitamin (MULTIVITAMIN WITH MINERALS) TABS Take 1 tablet by mouth every morning.  . Omega-3 Fatty Acids (FISH OIL PO) Take 500 mg by mouth daily.  . rosuvastatin (CRESTOR) 10  MG tablet Take 1 tablet (10 mg total) by mouth 3 (three) times a week.  . vitamin C (ASCORBIC ACID) 500 MG tablet Take 500 mg by mouth every morning.  . diclofenac (VOLTAREN) 75 MG EC tablet Take 1 tablet (75 mg total) by mouth 2 (two) times daily. (Patient not taking: Reported on 03/09/2017)  . levofloxacin (LEVAQUIN) 500 MG tablet Take 1 tablet (500 mg total) by mouth daily.  . methylPREDNISolone (MEDROL DOSEPAK) 4 MG TBPK tablet 6 day pak-take as directed   No facility-administered encounter medications on file as of 03/09/2017.     No Known Allergies   Current Medications, Allergies, Past Medical History, Past Surgical History, Family History, and Social History  were reviewed in [facility] record.   Review of Systems             All symptoms NEG except where BOLDED >>  Constitutional:  F/C/S, fatigue, anorexia, unexpected weight change. HEENT:  HA, visual changes, hearing loss, earache, nasal symptoms, sore throat, mouth sores, hoarseness. Resp:  cough, sputum, hemoptysis; SOB, tightness, wheezing. Cardio:  CP, palpit, DOE, orthopnea, edema. GI:  N/V/D/C, blood in stool; reflux, abd pain, distention, gas. GU:  dysuria, freq, urgency, hematuria, flank pain, voiding difficulty. MS:  joint pain, swelling, tenderness, decr ROM; neck pain, back pain, etc. Neuro:  HA, tremors, seizures, dizziness, syncope, weakness, numbness, gait abn. Skin:  suspicious lesions or skin rash. Heme:  adenopathy, bruising, bleeding. Psyche:  confusion, agitation, sleep disturbance, hallucinations, anxiety, depression suicidal.   Objective:   Physical Exam       Vital Signs:  Reviewed...   General:  WD, WN, 61 y/o WF in NAD; alert & oriented; pleasant & cooperative... HEENT:  [place]/AT; Conjunctiva- pink, Sclera- nonicteric, EOM-wnl, PERRLA, EACs-clear, TMs-wnl; NOSE-clear; THROAT-clear & wnl.  Neck:  Supple w/ fair ROM; no JVD; normal carotid impulses w/o bruits; no thyromegaly or  nodules palpated; no lymphadenopathy.  Chest:  Decr BS at bases, clear to P & A x few scat rhonchi & sl congested cough- without wheezes, rales, or signs of consolidation. Heart:  Regular Rhythm; norm S1 & S2 without murmurs, rubs, or gallops detected. Abdomen:  Soft & nontender- no guarding or rebound; normal bowel sounds; no organomegaly or masses palpated. Ext:  Normal ROM; without deformities or arthritic changes; no varicose veins, venous insuffic, or edema;  Pulses intact w/o bruits. Neuro:  No focal neuro deficits; sensory testing normal; gait normal & balance OK. Derm:  No lesions noted; no rash etc. Lymph:  No cervical, supraclavicular, axillary, or inguinal adenopathy palpated.   Assessment:      IMP >>     COPD/ Emphysema> on Advair250Bid, Incruse daily, Mucinex 600- 1to2Bid; Proair rescue & Flonase prn; CT Chest showed centrilob & paraseptal emphysema and PFTs showed GOLD Stage 2 disease...    Hx LUL pneumonia w/ left apical opac on CT 01/2015 slow to clear> f/u CT Chest 2/17 showed resolution of LUL changes    22mm pulm nodule on CT Chest 05/2015> located along the minor fissure & f/u due 2yr => f/u CT Chest 05/2016 w/ resolution of the nodule    Ex-smoker w/ 65+pack-yr hx>  She quit smoking in 2014...    Hx sinusitis w/ surg x2 in the past    Cardiac Issues> on ASA81; coronary & aortic calcif seen on CT Chest; prev hx of HBP (not on meds now) => CT Chest 05/2016 w/ Asc Ao measure of 4cm, +cardiac risk factors therefore referred to Cardiology to establish.    Medical issues> PCP is DrMcKeown on WellbutrinXL300 to help depression.Marland Kitchen   PLAN >>  02/10/15>  Katrina Campbell has mod COPD & quit smoking 2014;  She had a lingular pneumonia in 2015 which resolved to baseline after antibiotics and pred rx;  She presented w/ acute LUL symptoms c/w pneumonia and an abn CXR=> slow to resolve after antibiotic rx;  I told her that in my opinion we should give it a little longer to see if this represents XRay  lag time- rec to take AUGMENTIN875Bid/Align, continue MUCINEX(2Bid)/ fluids, continue ADVAIR250Bid, and restart the INCRUSE daily;  We plan ROV in 3wks w/ CXR & to consider timing of a repeat CT,  consider PET, etc... 03/11/15>  Katrina Campbell has Stage 2 COPD/ emphysema & prev LUL pneumonia w/ CT Chest 01/2015 showing left apical opacity (slow to clear); she has quit smoking (2014), takes Advair250Bid, Incruse daily, Mucinex, and we discussed need for incr exercise program... CXR today w/ COPD/emphysema & left apex looks ok- we decided to wait 37mo & recheck CT at that time.  05/12/15>  Left apical opac is resolved, new 57mm nodule in right mid-zone noted; for the cough she will continue the Mucinex and for the Dyspnea- advised again to start exercise program... We plan ROV in 69mo. 11/09/15>  Katrina Campbell is reassured about her breathing; doing satis off cigarettes for 26yrs, taking her inhalers regularly; now needs to incr her exercise program & push the envelope for max improvement- briefly discussed Pulm Rehab but she wants to try it on her own & I agree; we plan ROV recheck & f/u CT Chest in 8mo... 05/19/16>   Katrina Campbell's family has suffered a terrible event & she has developed incr dyspnea as a result;  We discussed the benefit & roll of Klonopin in this setting & she will try 0.5mg  1/2 to 1 tab Bid;  She is due for her f/u CT Chest & we will plan brief recheck in 6-8 wks  06/30/16>   Katrina Campbell is stable- she will continue the statin rx per drNelson & use the Klonopin as we discussed; stable on the Advair/ Incruse/ etc... we plan rov 3mo, sooner prn breathing problems... 03/09/17>   Katrina Campbell has a COPD exac & we discussed Levaquin500- Qdx7d and Medrol dosepak;  Asked to continue regular meds diligently, consider incr Mucinex600 to 2Bid + fluids;  When able=> back into exercise program & pay attention to her body in terms of chest discomfort, SOB, etc   Plan:       Medication List        Accurate as of 03/09/17  4:36 PM. Always  use your most recent med list.          ADVIL 200 MG tablet Generic drug:  ibuprofen   albuterol 108 (90 Base) MCG/ACT inhaler Commonly known as:  PROAIR HFA Inhale 2 puffs into the lungs every 6 (six) hours as needed for wheezing or shortness of breath.   aspirin 81 MG tablet   buPROPion 300 MG 24 hr tablet Commonly known as:  WELLBUTRIN XL Take 1 tablet (300 mg total) by mouth daily.   Calcium-Vitamin D 500-400 MG-UNIT Tabs 1 a day   diclofenac 75 MG EC tablet Commonly known as:  VOLTAREN Take 1 tablet (75 mg total) by mouth 2 (two) times daily.   FISH OIL PO   fluticasone 50 MCG/ACT nasal spray Commonly known as:  FLONASE Place 1 spray into both nostrils 2 (two) times daily as needed for allergies or rhinitis.   Fluticasone-Salmeterol 250-50 MCG/DOSE Aepb Commonly known as:  ADVAIR DISKUS Inhale 1 puff into the lungs 2 (two) times daily.   guaiFENesin 600 MG 12 hr tablet Commonly known as:  MUCINEX   INCRUSE ELLIPTA 62.5 MCG/INH Aepb Generic drug:  umeclidinium bromide USE 1 INHALATION DAILY (DISCARD 6 WEEKS AFTER OPENING FOIL TRAY)   levofloxacin 500 MG tablet Commonly known as:  LEVAQUIN Take 1 tablet (500 mg total) by mouth daily.   MAGNESIUM PO   methylPREDNISolone 4 MG Tbpk tablet Commonly known as:  MEDROL DOSEPAK 6 day pak-take as directed   multivitamin with minerals Tabs tablet   rosuvastatin 10 MG tablet Commonly known as:  CRESTOR Take 1 tablet (10 mg total) by mouth 3 (three) times a week.   vitamin C 500 MG tablet Commonly known as:  ASCORBIC ACID       Where to Get Your Medications    These medications were sent to [facility] [place] [facility] [address] [facility] [address]  [place], [place] [address]   Phone:  [phone]   levofloxacin 500 MG tablet  methylPREDNISolone 4 MG Tbpk tablet

## 2017-04-29 ENCOUNTER — Other Ambulatory Visit: Payer: Self-pay | Admitting: Pulmonary Disease

## 2017-06-27 ENCOUNTER — Ambulatory Visit: Admitting: Family Medicine

## 2017-07-04 ENCOUNTER — Other Ambulatory Visit: Payer: Self-pay | Admitting: *Deleted

## 2017-07-04 MED ORDER — FLUTICASONE-SALMETEROL 250-50 MCG/DOSE IN AEPB: 1.0000 | INHALATION_SPRAY | Freq: Two times a day (BID) | RESPIRATORY_TRACT | 2 refills | Status: DC

## 2017-07-11 ENCOUNTER — Other Ambulatory Visit: Payer: Self-pay | Admitting: Family Medicine

## 2017-08-08 ENCOUNTER — Ambulatory Visit (INDEPENDENT_AMBULATORY_CARE_PROVIDER_SITE_OTHER): Admitting: Family Medicine

## 2017-08-08 ENCOUNTER — Other Ambulatory Visit: Payer: Self-pay

## 2017-08-08 ENCOUNTER — Encounter: Payer: Self-pay | Admitting: Family Medicine

## 2017-08-08 VITALS — BP 136/84 | HR 75 | Temp 98.2°F | Resp 18 | Ht 64.0 in | Wt 141.4 lb

## 2017-08-08 DIAGNOSIS — B370 Candidal stomatitis: Secondary | ICD-10-CM

## 2017-08-08 DIAGNOSIS — T63484A Toxic effect of venom of other arthropod, undetermined, initial encounter: Secondary | ICD-10-CM

## 2017-08-08 MED ORDER — NYSTATIN 100000 UNIT/ML MT SUSP: 5.0000 mL | Freq: Four times a day (QID) | OROMUCOSAL | 0 refills | Status: DC

## 2017-08-08 MED ORDER — HYDROXYZINE HCL 10 MG PO TABS: 10.0000 mg | ORAL_TABLET | Freq: Four times a day (QID) | ORAL | 0 refills | Status: DC | PRN

## 2017-08-08 MED ORDER — METHYLPREDNISOLONE ACETATE 40 MG/ML IJ SUSP
40.0000 mg | Freq: Once | INTRAMUSCULAR | Status: AC
  Administered 2017-08-08: 40 mg via INTRAMUSCULAR

## 2017-08-08 NOTE — Progress Notes (Signed)
Patient received depo medrol 40 mg injection in right ventrogluteal. Patient tolerated well

## 2017-08-08 NOTE — Patient Instructions (Addendum)
Take over the counter zyrtec, allergra or claritin daily for the next several day, once to twice daily. Take Pepcid (famotidine) (over the counter ) 40 mg today. If the rash is not improving, get the hydroxyzine from the pharmacy and take as prescribed.  The steroid shot should help decrease the size of the reaction.  If in two days its not better, call us, we will need to get you a short burst of steroids.  Go to the ER or call 911 if you have swelling of your lips, face, tongue, or stridor or wheeze, chest pain, shortness of breath, uncontrolled nausea or vomiting.    Please return for recheck if this is not significantly better by Friday.   Oral Thrush, Adult Oral thrush, also called oral candidiasis, is a fungal infection that develops in the mouth and throat and on the tongue. It causes white patches to form on the mouth and tongue. Katrina Campbell is most common in older adults, but it can occur at any age. Many cases of thrush are mild, but this infection can also be serious. Katrina Campbell can be a repeated (recurrent) problem for certain people who have a weak body defense system (immune system). The weakness can be caused by chronic illnesses, or by taking medicines that limit the body's ability to fight infection. If a person has difficulty fighting infection, the fungus that causes thrush can spread through the body. This can cause life-threatening blood or organ infections. What are the causes? This condition is caused by a fungus (yeast) called Candida albicans.  This fungus is normally present in small amounts in the mouth and on other mucous membranes. It usually causes no harm.  If conditions are present that allow the fungus to grow without control, it invades surrounding tissues and becomes an infection.  Other Candida species can also lead to thrush (rare).  What increases the risk? This condition is more likely to develop in:  People with a weakened immune system.  Older  adults.  People with HIV (human immunodeficiency virus).  People with diabetes.  People with dry mouth (xerostomia).  Pregnant women.  People with poor dental care, especially people who have false teeth.  People who use antibiotic medicines.  What are the signs or symptoms? Symptoms of this condition can vary from mild and moderate to severe and persistent. Symptoms may include:  A burning feeling in the mouth and throat. This can occur at the start of a thrush infection.  White patches that stick to the mouth and tongue. The tissue around the patches may be red, raw, and painful. If rubbed (during tooth brushing, for example), the patches and the tissue of the mouth may bleed easily.  A bad taste in the mouth or difficulty tasting foods.  A cottony feeling in the mouth.  Pain during eating and swallowing.  Poor appetite.  Cracking at the corners of the mouth.  How is this diagnosed? This condition is diagnosed based on:  Physical exam. Your health care provider will look in your mouth.  Health history. Your health care provider will ask you questions about your health.  How is this treated? This condition is treated with medicines called antifungals, which prevent the growth of fungi. These medicines are either applied directly to the affected area (topical) or swallowed (oral). The treatment will depend on the severity of the condition. Mild thrush Mild cases of thrush may clear up with the use of an antifungal mouth rinse or lozenges. Treatment usually lasts about  14 days. Moderate to severe thrush  More severe thrush infections that have spread to the esophagus are treated with an oral antifungal medicine. A topical antifungal medicine may also be used.  For some severe infections, treatment may need to continue for more than 14 days.  Oral antifungal medicines are rarely used during pregnancy because they may be harmful to the unborn child. If you are pregnant,  talk with your health care provider about options for treatment. Persistent or recurrent thrush For cases of thrush that do not go away or keep coming back:  Treatment may be needed twice as long as the symptoms last.  Treatment will include both oral and topical antifungal medicines.  People with a weakened immune system can take an antifungal medicine on a continuous basis to prevent thrush infections.  It is important to treat conditions that make a person more likely to get thrush, such as diabetes or HIV. Follow these instructions at home: Medicines  Take over-the-counter and prescription medicines only as told by your health care provider.  Talk with your health care provider about an over-the-counter medicine called gentian violet, which kills bacteria and fungi. Relieving soreness and discomfort To help reduce the discomfort of thrush:  Drink cold liquids such as water or iced tea.  Try flavored ice treats or frozen juices.  Eat foods that are easy to swallow, such as gelatin, ice cream, or custard.  Try drinking from a straw if the patches in your mouth are painful.  General instructions  Eat plain, unflavored yogurt as directed by your health care provider. Check the label to make sure the yogurt contains live cultures. This yogurt can help healthy bacteria to grow in the mouth and can stop the growth of the fungus that causes thrush.  If you wear dentures, remove the dentures before going to bed, brush them vigorously, and soak them in a cleaning solution as directed by your health care provider.  Rinse your mouth with a warm salt-water mixture several times a day. To make a salt-water mixture, completely dissolve 1/2-1 tsp of salt in 1 cup of warm water. Contact a health care provider if:  Your symptoms are getting worse or are not improving within 7 days of starting treatment.  You have symptoms of a spreading infection, such as white patches on the skin outside of  the mouth. This information is not intended to replace advice given to you by your health care provider. Make sure you discuss any questions you have with your health care provider. Document Released: 12/15/2003 Document Revised: 12/14/2015 Document Reviewed: 12/14/2015 Elsevier Interactive Patient Education  2017 [place], [place], or [employer], Adult Bees, wasps, and hornets are part of a family of insects that can sting people. These stings can cause pain and inflammation, but they are usually not serious. However, some people may have an allergic reaction to a sting. This can cause the symptoms to be more severe. What increases the risk? You may be at a greater risk of getting stung if you:  Provoke a stinging insect by swatting or disturbing it.  Wear strong-smelling soaps, deodorants, or body sprays.  Spend time outdoors near gardens with flowers or fruit trees or in clothes that expose skin.  Eat or drink outside.  What are the signs or symptoms? Common symptoms of this condition include:  A red lump in the skin that sometimes has a tiny hole in the center. In some cases, a stinger may be  in the center of the wound.  Pain and itching at the sting site.  Redness and swelling around the sting site. If you have an allergic reaction (localized allergic reaction), the swelling and redness may spread out from the sting site. In some cases, this reaction can continue to develop over the next 24-48 hours.  In rare cases, a person may have a severe allergic reaction (anaphylactic reaction) to a sting. Symptoms of an anaphylactic reaction may include:  Wheezing or difficulty breathing.  Raised, itchy, red patches on the skin (hives).  Nausea or vomiting.  Abdominal cramping.  Diarrhea.  Tightness in the chest or chest pain.  Dizziness or fainting.  Redness of the face (flushing).  Hoarse voice.  Swollen tongue, lips, or face.  How is this diagnosed? This  condition is usually diagnosed based on your symptoms and medical history as well as a physical exam. You may have an allergy test to determine if you are allergic to the substance that the insect injected during the sting (venom). How is this treated? If you were stung by a bee, the stinger and a small sac of venom may be in the wound. It is important to remove the stinger as soon as possible. You can do this by brushing across the wound with gauze, a fingernail, or a flat card such as a credit card. Removing the stinger can help reduce the severity of your body's reaction to the sting. Most stings can be treated with:  Icing to reduce swelling in the area.  Medicines (antihistamines) to treat itching or an allergic reaction.  Medicines to help reduce pain. These may be medicines that you take by mouth, or medicated creams or lotions that you apply to your skin.  Pay close attention to your symptoms after you have been stung. If possible, have someone stay with you to make sure you do not have an allergic reaction. If you have any signs of an allergic reaction, call your health care provider. If you have ever had a severe allergic reaction, your health care provider may give you an inhaler or injectable medicine (epinephrine auto-injector) to use if necessary. Follow these instructions at home:  Wash the sting site 2-3 times each day with soap and water as told by your health care provider.  Apply or take over-the-counter and prescription medicines only as told by your health care provider.  If directed, apply ice to the sting area. ? Put ice in a plastic bag. ? Place a towel between your skin and the bag. ? Leave the ice on for 20 minutes, 2-3 times a day.  Do not scratch the sting area.  If you had a severe allergic reaction to a sting, you may need: ? To wear a medical bracelet or necklace that lists the allergy. ? To learn when and how to use an anaphylaxis kit or epinephrine  injection. Your family members and coworkers may also need to learn this. ? To carry an anaphylaxis kit or epinephrine injection with you at all times. How is this prevented?  Avoid swatting at stinging insects and disturbing insect nests.  Do not use fragrant soaps or lotions.  Wear shoes, pants, and long sleeves when spending time outdoors, especially in grassy areas where stinging insects are common.  Keep outdoor areas free from nests or hives.  Keep food and drink containers covered when eating outdoors.  Avoid working or sitting near [employer], if possible.  Wear gloves if you are gardening or  working outdoors.  If an attack by a stinging insect or a swarm seems likely in the moment, move away from the area or find a barrier between you and the insect(s), such as a door. Contact a health care provider if:  Your symptoms do not get better in 2-3 days.  You have redness, swelling, or pain that spreads beyond the area of the sting.  You have a fever. Get help right away if: You have symptoms of a severe allergic reaction. These include:  Wheezing or difficulty breathing.  Tightness in the chest or chest pain.  Light-headedness or fainting.  Itchy, raised, red patches on the skin.  Nausea or vomiting.  Abdominal cramping.  Diarrhea.  A swollen tongue or lips, or trouble swallowing.  Dizziness or fainting.  Summary  Stings from bees, wasps, and hornets can cause pain and inflammation, but they are usually not serious. However, some people may have an allergic reaction to a sting. This can cause the symptoms to be more severe.  Pay close attention to your symptoms after you have been stung. If possible, have someone stay with you to make sure you do not have an allergic reaction.  Call your health care provider if you have any signs of an allergic reaction. This information is not intended to replace advice given to you by your health care provider. Make  sure you discuss any questions you have with your health care provider. Document Released: 03/21/2005 Document Revised: 05/26/2016 Document Reviewed: 05/26/2016 Elsevier Interactive Patient Education  [employer].

## 2017-08-08 NOTE — Progress Notes (Signed)
Patient ID: Katrina Campbell, female    DOB: [birth year], 62 y.o.   MRN: [id number]  PCP: Alycia Rossetti, MD  Chief Complaint  Patient presents with  . bug bite on lower right back side    reached down to pick up dog     Subjective:   Katrina Campbell is a 62 y.o. female, presents to clinic with CC of possible sting or insect bite to her right abdomen that occurred 3 days ago.  She had taken her dog outside for a walk and was in some bushes when she felt something sting her on her right side is very mild felt like some burning or stinging.  She states that later that night she had a baseball sized red swollen area to that side but it still was not very bothersome and she had no other symptoms at that time.  Sunday morning, 2 days ago the area has spread from baseball size to about the size of a basketball covering most of her right side and abdomen.  Stinging burning and mild itching was more severe.  She applied topical hydrocortisone cream to it without much change.  She had some associated nausea, decreased appetite, and headache for which she took Tylenol.  She also feels like she is swollen all over to her hands and her face.  She had no other area of rash.  She denies any swelling her lips around her eyes.  No sensation of tongue swelling or throat closure.  She does have a history of COPD and pulmonary disease she denies any worsening shortness of breath from her baseline denies any stridor, wheezing.  It is not improved over the past 2 days so she came into be evaluated.  She has not tried any other treatments topical or over-the-counter.  She is unable to take Benadryl because it makes her extremely agitated.    She denies fever, chills, sweats, vomiting, diarrhea, near-syncope, rash.  No other new symptoms associated with possible sting or insect bite with this rash.  She does complain of chronic sore mouth and throat, inability to swallow citrusy things or spicy foods.  She is on inhaled  corticosteroid for her COPD.  She was treated a few years ago for oral thrush.  When examining her today, I commented that her throat was red and appear to have thrush, and she would like to be treated again for it.  When discussing treatments for his possible insect bite or localized allergic reaction patient states that she does not want to take pills if at all possible because her daughter did die of a drug overdose and she is very wary of medications.   Patient Active Problem List   Diagnosis Date Noted  . COPD with acute exacerbation ([place]) 03/09/2017  . Dysthymia 03/09/2017  . DOE (dyspnea on exertion) 06/23/2016  . Vitamin D deficiency 12/30/2015  . Thoracic aortic aneurysm ([place]) 12/30/2015  . Atherosclerosis of aorta ([place]) 12/30/2015  . Osteopenia 12/30/2015  . Abnormal CT of the chest 02/10/2015  . Depression   . Anxiety   . COPD (chronic obstructive pulmonary disease) ([place])   . Hyperlipidemia   . Labile hypertension      Prior to Admission medications   Medication Sig Start Date End Date Taking? Authorizing Provider  albuterol (PROAIR HFA) 108 (90 BASE) MCG/ACT inhaler Inhale 2 puffs into the lungs every 6 (six) hours as needed for wheezing or shortness of breath. 12/10/14  Yes [name], PA-C  aspirin  81 MG tablet Take 81 mg by mouth daily.   Yes [provider]  buPROPion (WELLBUTRIN XL) 300 MG 24 hr tablet TAKE 1 TABLET DAILY 07/11/17  Yes [place], Modena Nunnery, MD  Calcium Carb-Cholecalciferol (CALCIUM-VITAMIN D) 500-400 MG-UNIT TABS 1 a day 12/28/16  Yes Jemison, Modena Nunnery, MD  diclofenac (VOLTAREN) 75 MG EC tablet Take 1 tablet (75 mg total) by mouth 2 (two) times daily. 03/08/17  Yes Hagler, Aaron Edelman, MD  fluticasone (FLONASE) 50 MCG/ACT nasal spray Place 1 spray into both nostrils 2 (two) times daily as needed for allergies or rhinitis. 12/29/16  Yes Hallwood, Modena Nunnery, MD  Fluticasone-Salmeterol (ADVAIR DISKUS) 250-50 MCG/DOSE AEPB Inhale 1 puff into the lungs 2 (two)  times daily. 07/04/17  Yes Noralee Space, MD  guaiFENesin (MUCINEX) 600 MG 12 hr tablet Take 1,200 mg by mouth 2 (two) times daily.    Yes [provider]  ibuprofen (ADVIL) 200 MG tablet Take 400 mg by mouth every 8 (eight) hours as needed for fever.   Yes [provider]  INCRUSE ELLIPTA 62.5 MCG/INH AEPB USE 1 INHALATION DAILY (DISCARD 6 WEEKS AFTER OPENING FOIL TRAY) 05/01/17  Yes Noralee Space, MD  MAGNESIUM PO Take by mouth daily.   Yes [provider]  Multiple Vitamin (MULTIVITAMIN WITH MINERALS) TABS Take 1 tablet by mouth every morning.   Yes [provider]  Omega-3 Fatty Acids (FISH OIL PO) Take 500 mg by mouth daily.   Yes [provider]  rosuvastatin (CRESTOR) 10 MG tablet Take 1 tablet (10 mg total) by mouth 3 (three) times a week. 08/26/16  Yes Dorothy Spark, MD  vitamin C (ASCORBIC ACID) 500 MG tablet Take 500 mg by mouth every morning.   Yes [provider]  hydrOXYzine (ATARAX/VISTARIL) 10 MG tablet Take 1 tablet (10 mg total) by mouth every 6 (six) hours as needed for itching (itching, burning rash). 08/08/17   Delsa Grana, PA-C  nystatin (MYCOSTATIN) 100000 UNIT/ML suspension Take 5 mLs (500,000 Units total) by mouth 4 (four) times daily. Swish, gargle and swallow 08/08/17   Delsa Grana, PA-C     No Known Allergies   Family History  Problem Relation Age of Onset  . Heart attack Mother   . Emphysema Mother   . Congestive Heart Failure Mother   . Heart attack Father   . Congestive Heart Failure Father   . Asthma Other   . Lung disease Sister   . Lung disease Brother   . Colon cancer Neg Hx      Social History   Socioeconomic History  . Marital status: Married    Spouse name: Not on file  . Number of children: Not on file  . Years of education: Not on file  . Highest education level: Not on file  Occupational History  . Not on file  Social Needs  . Financial resource strain: Not on file  . Food  insecurity:    Worry: Not on file    Inability: Not on file  . Transportation needs:    Medical: Not on file    Non-medical: Not on file  Tobacco Use  . Smoking status: Former Smoker    Packs/day: 1.00    Years: 40.00    Pack years: 40.00    Types: Cigarettes    Last attempt to quit: 11/09/2012    Years since quitting: 4.7  . Smokeless tobacco: Never Used  . Tobacco comment: vapor  Substance and Sexual Activity  .  Alcohol use: Yes    Alcohol/week: 14.4 oz    Types: 24 Cans of beer per week  . Drug use: No  . Sexual activity: Not Currently  Lifestyle  . Physical activity:    Days per week: Not on file    Minutes per session: Not on file  . Stress: Not on file  Relationships  . Social connections:    Talks on phone: Not on file    Gets together: Not on file    Attends religious service: Not on file    Active member of club or organization: Not on file    Attends meetings of clubs or organizations: Not on file    Relationship status: Not on file  . Intimate partner violence:    Fear of current or ex partner: Not on file    Emotionally abused: Not on file    Physically abused: Not on file    Forced sexual activity: Not on file  Other Topics Concern  . Not on file  Social History Narrative  . Not on file     Review of Systems  Constitutional: Negative.  Negative for activity change, appetite change, chills, diaphoresis, fatigue, fever and unexpected weight change.  HENT: Positive for mouth sores and sore throat. Negative for congestion, dental problem, drooling, facial swelling, sneezing, trouble swallowing and voice change.   Eyes: Negative.  Negative for pain, discharge, redness and itching.  Respiratory: Negative for apnea, cough, choking, chest tightness, shortness of breath, wheezing and stridor.   Cardiovascular: Negative.  Negative for chest pain, palpitations and leg swelling.  Gastrointestinal: Positive for nausea. Negative for abdominal distention, abdominal  pain, diarrhea and vomiting.  Endocrine: Negative.   Genitourinary: Negative.   Musculoskeletal: Positive for arthralgias. Negative for joint swelling and myalgias.  Skin: Positive for color change. Negative for pallor, rash and wound.  Allergic/Immunologic: Negative.   Neurological: Positive for headaches. Negative for dizziness, tremors, seizures, syncope, speech difficulty, weakness, light-headedness and numbness.  Hematological: Negative.   Psychiatric/Behavioral: Negative.   All other systems reviewed and are negative.      Objective:    Vitals:   08/08/17 0832  BP: 136/84  Pulse: 75  Resp: 18  Temp: 98.2 F (36.8 C)  TempSrc: Oral  SpO2: 96%  Weight: 141 lb 6.4 oz (64.1 kg)  Height: 5\' 4"  (1.626 m)      Physical Exam  Constitutional: She is oriented to person, place, and time. She appears well-developed and well-nourished.  Non-toxic appearance. No distress.  Thin well-appearing female, appears stated age, no acute distress  HENT:  Head: Normocephalic and atraumatic.  Right Ear: External ear normal.  Left Ear: External ear normal.  Nose: Nose normal.  Mouth/Throat: Uvula is midline and mucous membranes are normal. No oropharyngeal exudate.  Diffuse pharyngeal erythema with white patches, over OP, palate, tongue Uvula midline, no edema Normal phonation, no stridor No swelling of lips, no tongue edema, no trismus  Eyes: Pupils are equal, round, and reactive to light. Conjunctivae, EOM and lids are normal. Right eye exhibits no discharge. Left eye exhibits no discharge. Scleral icterus is present.  No periorbital edema or erythema  Neck: Normal range of motion and phonation normal. Neck supple. No tracheal deviation present.  Cardiovascular: Normal rate, regular rhythm, normal heart sounds, intact distal pulses and normal pulses. Exam reveals no gallop and no friction rub.  No murmur heard. Pulses:      Radial pulses are 2+ on the right side, and 2+ on  the left  side.       Posterior tibial pulses are 2+ on the right side, and 2+ on the left side.  Pulmonary/Chest: Effort normal and breath sounds normal. No stridor. No respiratory distress. She has no wheezes. She has no rhonchi. She has no rales. She exhibits no tenderness.  Abdominal: Soft. Normal appearance and bowel sounds are normal. She exhibits no distension and no mass. There is no tenderness. There is no rebound and no guarding.  Musculoskeletal: Normal range of motion. She exhibits no edema or deformity.  Lymphadenopathy:    She has no cervical adenopathy.  Neurological: She is alert and oriented to person, place, and time. No cranial nerve deficit. She exhibits normal muscle tone. Coordination and gait normal.  Skin: Skin is warm, dry and intact. Capillary refill takes less than 2 seconds. Rash noted. She is not diaphoretic. There is erythema. No pallor.  Only 10 x 8 area of erythema, edema and induration to right side of abdomen between hips and ribs, with surrounding ~15 cm radius of mild edema with erythema and without induration.  Entire area with mild warmth.  Nontender to palpation, no fluctuance, skin intact, excoriations  No rash noted to anywhere else on patient's body, no rash to chest neck or face  Psychiatric: She has a normal mood and affect. Her speech is normal and behavior is normal. Judgment and thought content normal.  Nursing note and vitals reviewed.         Assessment & Plan:      ICD-10-CM   1. Allergic reaction to insect sting, undetermined intent, initial encounter T63.484A hydrOXYzine (ATARAX/VISTARIL) 10 MG tablet    methylPREDNISolone acetate (DEPO-MEDROL) injection 40 mg  2. Oral thrush B37.0 nystatin (MYCOSTATIN) 100000 UNIT/ML suspension    Large area of erythema and edema to right side of patient's abdomen, secondary to possible insect or bug bite, onset 3 days ago, gradually worsened over the first 24 to 48 hours and has been unchanged since that time  with mild constitutional symptoms of nausea headache and sensation of swelling however patient has no lip or face swelling, stridor, wheeze.  She has not taken any Benadryl because of adverse reactions.  She does not want to take steroids because she is very wary of taking pills.  She did agree to getting a steroid shot in clinic here.  She will continue to apply topical steroids to the area of swelling.  Most consistent with a localized allergic reaction to possible insect sting or bite.  No concern for skin infection at this time.  Advised patient to treat with antihistamines and Pepcid with steroids.  Plan is to treat with second-generation antihistamines for the next 1 to 2 days to see if she tolerates it and see if rash decreases inside with the steroid shot.  If the rash does not begin to decrease then I have called her in a lower dose Atarax to try.  If Atarax Pepcid and steroid injection do not decrease the area of erythema and edema in the next 2 to 3 days she is supposed to contact us or return for recheck so that we can prescribe a short lower dose burst of steroids.  Patient is in agreement with this plan, it was printed out for her on her discharge instructions and highlighted with her.  She is also advised to recheck if there is any concerning signs of skin infection developing.  On exam was also noted that patient has oral thrush secondary  to ICS use.  Gust proper use and swish and spitting after inhaled steroids.  Will prescribe oral nystatin to swish gargle and swallow.    Patient tolerated steroid injection was rechecked before she left, airway patent, no respiratory distress, vital signs stable and reassuring, patient left in good condition with stable vital signs.   Delsa Grana, PA-C 08/08/17 11:05 AM

## 2017-09-05 ENCOUNTER — Other Ambulatory Visit

## 2017-09-06 ENCOUNTER — Telehealth: Payer: Self-pay | Admitting: Pulmonary Disease

## 2017-09-06 MED ORDER — PREDNISONE 10 MG PO TABS: ORAL_TABLET | ORAL | 0 refills | Status: DC

## 2017-09-06 MED ORDER — DOXYCYCLINE HYCLATE 100 MG PO TABS: 100.0000 mg | ORAL_TABLET | Freq: Two times a day (BID) | ORAL | 0 refills | Status: DC

## 2017-09-06 NOTE — Telephone Encounter (Signed)
Spoke with pt, she started feeling bad 4 days ago with chest congestion. She is coughing up light green phlegm but sometimes the cough is dry especially at night. She knows she is going to get worse if she doesn't get an ABX. She denies any SOB but did have some wheezing last night. She has taken Mucin ex which she uses every day but has taken more since feeling sick. She also used her Flonase as well but no relief. Please advise DOD.   [facility]

## 2017-09-06 NOTE — Telephone Encounter (Signed)
Take doxycycline 100mg  po twice daily x 5 days; take after meals and avoid sunlight   Please take prednisone 40 mg x1 day, then 30 mg x1 day, then 20 mg x1 day, then 10 mg x1 day, and then 5 mg x1 day and stop

## 2017-09-06 NOTE — Telephone Encounter (Signed)
Spoke with pt. She is aware of Dr. Golden Pop recommendations. Rxs have been sent in. Nothing further was needed.

## 2017-09-12 ENCOUNTER — Ambulatory Visit: Admitting: Physician Assistant

## 2017-09-23 ENCOUNTER — Other Ambulatory Visit: Payer: Self-pay | Admitting: Family Medicine

## 2017-09-25 LAB — HM MAMMOGRAPHY

## 2017-09-27 ENCOUNTER — Encounter: Payer: Self-pay | Admitting: *Deleted

## 2017-10-03 ENCOUNTER — Other Ambulatory Visit: Admitting: *Deleted

## 2017-10-03 DIAGNOSIS — R0600 Dyspnea, unspecified: Secondary | ICD-10-CM

## 2017-10-03 DIAGNOSIS — R9389 Abnormal findings on diagnostic imaging of other specified body structures: Secondary | ICD-10-CM

## 2017-10-03 DIAGNOSIS — E785 Hyperlipidemia, unspecified: Secondary | ICD-10-CM

## 2017-10-03 DIAGNOSIS — I700 Atherosclerosis of aorta: Secondary | ICD-10-CM

## 2017-10-03 DIAGNOSIS — R0609 Other forms of dyspnea: Secondary | ICD-10-CM

## 2017-10-03 LAB — COMPREHENSIVE METABOLIC PANEL
ALT: 39 IU/L — ABNORMAL HIGH (ref 0–32)
AST: 32 IU/L (ref 0–40)
Albumin/Globulin Ratio: 2.2 (ref 1.2–2.2)
Albumin: 4.4 g/dL (ref 3.6–4.8)
Alkaline Phosphatase: 85 IU/L (ref 39–117)
BUN/Creatinine Ratio: 16 (ref 12–28)
BUN: 13 mg/dL (ref 8–27)
Bilirubin Total: 0.4 mg/dL (ref 0.0–1.2)
CO2: 24 mmol/L (ref 20–29)
Calcium: 9.1 mg/dL (ref 8.7–10.3)
Chloride: 106 mmol/L (ref 96–106)
Creatinine, Ser: 0.8 mg/dL (ref 0.57–1.00)
GFR calc Af Amer: 90 mL/min/{1.73_m2} (ref >59)
GFR calc non Af Amer: 78 mL/min/{1.73_m2} (ref >59)
Globulin, Total: 2 g/dL (ref 1.5–4.5)
Glucose: 91 mg/dL (ref 65–99)
Potassium: 4.4 mmol/L (ref 3.5–5.2)
Sodium: 143 mmol/L (ref 134–144)
Total Protein: 6.4 g/dL (ref 6.0–8.5)

## 2017-10-03 LAB — LIPID PANEL
Chol/HDL Ratio: 2.8 ratio (ref 0.0–4.4)
Cholesterol, Total: 193 mg/dL (ref 100–199)
HDL: 69 mg/dL (ref >39)
LDL Calculated: 107 mg/dL — ABNORMAL HIGH (ref 0–99)
Triglycerides: 87 mg/dL (ref 0–149)
VLDL Cholesterol Cal: 17 mg/dL (ref 5–40)

## 2017-10-04 ENCOUNTER — Telehealth: Payer: Self-pay | Admitting: Cardiology

## 2017-10-04 NOTE — Telephone Encounter (Signed)
Pt calling and returning your call. Please call pt.

## 2017-10-04 NOTE — Telephone Encounter (Signed)
Notified the Katrina Campbell that per Dr Meda Coffee, her labs showed that she had a normal CMP, normal HDL, TG, borderline LDL, and she recommends that she continue the same dose of rosuvastatin.  Katrina Campbell verbalized understanding and agrees with this plan.

## 2017-10-10 ENCOUNTER — Ambulatory Visit (INDEPENDENT_AMBULATORY_CARE_PROVIDER_SITE_OTHER): Admitting: Physician Assistant

## 2017-10-10 ENCOUNTER — Encounter: Payer: Self-pay | Admitting: Physician Assistant

## 2017-10-10 VITALS — BP 144/86 | HR 62 | Ht 64.0 in | Wt 138.4 lb

## 2017-10-10 DIAGNOSIS — I712 Thoracic aortic aneurysm, without rupture, unspecified: Secondary | ICD-10-CM

## 2017-10-10 DIAGNOSIS — E785 Hyperlipidemia, unspecified: Secondary | ICD-10-CM | POA: Diagnosis not present

## 2017-10-10 DIAGNOSIS — R9389 Abnormal findings on diagnostic imaging of other specified body structures: Secondary | ICD-10-CM | POA: Diagnosis not present

## 2017-10-10 DIAGNOSIS — R0600 Dyspnea, unspecified: Secondary | ICD-10-CM

## 2017-10-10 DIAGNOSIS — I700 Atherosclerosis of aorta: Secondary | ICD-10-CM | POA: Diagnosis not present

## 2017-10-10 DIAGNOSIS — R0609 Other forms of dyspnea: Secondary | ICD-10-CM

## 2017-10-10 MED ORDER — ROSUVASTATIN CALCIUM 10 MG PO TABS: 10.0000 mg | ORAL_TABLET | ORAL | 3 refills | Status: DC

## 2017-10-10 NOTE — Progress Notes (Signed)
Cardiology Office Note    Date:  10/10/2017   ID:  Katrina Campbell, DOB 1955-09-27, MRN [id number]  PCP:  Katrina Rossetti, MD  Cardiologist: No primary care provider on file.  Chief Complaint  Patient presents with  . Follow-up    History of Present Illness:  Katrina Campbell is a 62 y.o. female who has a history of coronary calcification and mildly dilated thoracic aortic aneurysm on CT and underwent nuclear stress test with excellent functional capacity, no ischemia and normal LVEF.  She also has hyperlipidemia managed with Crestor, COPD.  Recent lipid profile cholesterol 193 LDL 107 HDL 69, ALT slightly elevated at 39.  Patient comes in today for yearly follow-up.  Overall she feels well.  She denies any chest pain, palpitations, dyspnea, dizziness or presyncope.  She has chronic dyspnea on exertion from her COPD.  Blood pressure is up a little today and she has not been checking it at home.  She just got back from the beach so probably has not been eating as well as she should have.  Is scheduled to see Dr. Lenna Gilford in October and may have a CT then.  Exercises 40 minutes daily without problems.  She stopped her baby aspirin 6 months ago after reading up on the latest study.    Past Medical History:  Diagnosis Date  . Anxiety   . COPD (chronic obstructive pulmonary disease) ([place])   . Depression   . Hyperlipidemia   . Labile hypertension     Past Surgical History:  Procedure Laterality Date  . NASAL SINUS SURGERY  1986    Current Medications: Current Meds  Medication Sig  . albuterol (PROAIR HFA) 108 (90 BASE) MCG/ACT inhaler Inhale 2 puffs into the lungs every 6 (six) hours as needed for wheezing or shortness of breath.  Marland Kitchen buPROPion (WELLBUTRIN XL) 300 MG 24 hr tablet TAKE 1 TABLET DAILY  . Calcium Carb-Cholecalciferol (CALCIUM-VITAMIN D) 500-400 MG-UNIT TABS 1 a day  . fluticasone (FLONASE) 50 MCG/ACT nasal spray USE 1 SPRAY IN EACH NOSTRIL TWICE A DAY AS NEEDED FOR ALLERGIES OR  RHINITIS  . Fluticasone-Salmeterol (ADVAIR DISKUS) 250-50 MCG/DOSE AEPB Inhale 1 puff into the lungs 2 (two) times daily.  Marland Kitchen guaiFENesin (MUCINEX) 600 MG 12 hr tablet Take 1,200 mg by mouth 2 (two) times daily.   . hydrOXYzine (ATARAX/VISTARIL) 10 MG tablet Take 1 tablet (10 mg total) by mouth every 6 (six) hours as needed for itching (itching, burning rash).  Marland Kitchen ibuprofen (ADVIL) 200 MG tablet Take 400 mg by mouth every 8 (eight) hours as needed for fever.  . INCRUSE ELLIPTA 62.5 MCG/INH AEPB USE 1 INHALATION DAILY (DISCARD 6 WEEKS AFTER OPENING FOIL TRAY)  . MAGNESIUM PO Take by mouth daily.  . Multiple Vitamin (MULTIVITAMIN WITH MINERALS) TABS Take 1 tablet by mouth every morning.  . Omega-3 Fatty Acids (FISH OIL PO) Take 500 mg by mouth daily.  . predniSONE (DELTASONE) 10 MG tablet Take 40 mg x1 day, then 30 mg x1 day, then 20 mg x1 day, then 10 mg x1 day, and then 5 mg x1 day and stop  . rosuvastatin (CRESTOR) 10 MG tablet Take 1 tablet (10 mg total) by mouth 3 (three) times a week.  . vitamin C (ASCORBIC ACID) 500 MG tablet Take 500 mg by mouth every morning.     Allergies:   Patient has no known allergies.   Social History   Socioeconomic History  . Marital status: Married    Spouse  name: Not on file  . Number of children: Not on file  . Years of education: Not on file  . Highest education level: Not on file  Occupational History  . Not on file  Social Needs  . Financial resource strain: Not on file  . Food insecurity:    Worry: Not on file    Inability: Not on file  . Transportation needs:    Medical: Not on file    Non-medical: Not on file  Tobacco Use  . Smoking status: Former Smoker    Packs/day: 1.00    Years: 40.00    Pack years: 40.00    Types: Cigarettes    Last attempt to quit: 11/09/2012    Years since quitting: 4.9  . Smokeless tobacco: Never Used  . Tobacco comment: vapor  Substance and Sexual Activity  . Alcohol use: Yes    Alcohol/week: 14.4 oz     Types: 24 Cans of beer per week  . Drug use: No  . Sexual activity: Not Currently  Lifestyle  . Physical activity:    Days per week: Not on file    Minutes per session: Not on file  . Stress: Not on file  Relationships  . Social connections:    Talks on phone: Not on file    Gets together: Not on file    Attends religious service: Not on file    Active member of club or organization: Not on file    Attends meetings of clubs or organizations: Not on file    Relationship status: Not on file  Other Topics Concern  . Not on file  Social History Narrative  . Not on file     Family History:  The patient's family history includes Asthma in her other; Congestive Heart Failure in her father and mother; Emphysema in her mother; Heart attack in her father and mother; Lung disease in her brother and sister.   ROS:   Please see the history of present illness.    Review of Systems  Constitution: Negative.  HENT: Negative.   Eyes: Negative.   Cardiovascular: Positive for dyspnea on exertion.  Respiratory: Positive for cough.   Hematologic/Lymphatic: Bruises/bleeds easily.  Musculoskeletal: Negative.  Negative for joint pain.  Gastrointestinal: Negative.   Genitourinary: Negative.   Neurological: Negative.    All other systems reviewed and are negative.   PHYSICAL EXAM:   VS:  BP (!) 144/86 (BP Location: Left Arm, Patient Position: Sitting, Cuff Size: Normal)   Pulse 62   Ht 5\' 4"  (1.626 m)   Wt 138 lb 6.4 oz (62.8 kg)   BMI 23.76 kg/m   Physical Exam  GEN: Well nourished, well developed, in no acute distress  Neck: no JVD, carotid bruits, or masses Cardiac:RRR; no murmurs, rubs, or gallops  Respiratory:  clear to auscultation bilaterally, normal work of breathing GI: soft, nontender, nondistended, + BS Ext: without cyanosis, clubbing, or edema, Good distal pulses bilaterally Neuro:  Alert and Oriented x 3 Psych: euthymic mood, full affect  Wt Readings from Last 3 Encounters:   10/10/17 138 lb 6.4 oz (62.8 kg)  08/08/17 141 lb 6.4 oz (64.1 kg)  03/09/17 142 lb 9.6 oz (64.7 kg)      Studies/Labs Reviewed:   EKG:  EKG is ordered today.  The ekg ordered today demonstrates normal sinus rhythm, normal EKG  Recent Labs: 12/28/2016: Hemoglobin 14.1; Platelets 145 10/03/2017: ALT 39; BUN 13; Creatinine, Ser 0.80; Potassium 4.4; Sodium 143   Lipid  Panel    Component Value Date/Time   CHOL 193 10/03/2017 0806   TRIG 87 10/03/2017 0806   HDL 69 10/03/2017 0806   CHOLHDL 2.8 10/03/2017 0806   CHOLHDL 2.6 12/28/2016 0906   VLDL 38 (H) 12/30/2015 1536   LDLCALC 107 (H) 10/03/2017 0806   LDLCALC 101 (H) 12/28/2016 0906    Additional studies/ records that were reviewed today include:  CT 2/2018IMPRESSION: 1. Resolution of previously described nodule along the right minor fissure. 2. Advanced emphysema, without acute superimposed process. 3. Age advanced coronary artery atherosclerosis. Recommend assessment of coronary risk factors and consideration of medical therapy. 4. Borderline to mild ascending aortic aneurysm, unchanged. Recommend annual imaging followup by CTA or MRA. This recommendation follows 2010 ACCF/AHA/AATS/ACR/ASA/SCA/SCAI/SIR/STS/SVM Guidelines for the Diagnosis and Management of Patients with Thoracic Aortic Disease. Circulation. 2010; 121: I203-T597     Electronically Signed   By: Abigail Miyamoto M.D.   On: 05/31/2016 15:59    Nuclear stress test 3/2018Study Highlights      Nuclear stress EF: 58%.  Blood pressure demonstrated a normal response to exercise.  There was no ST segment deviation noted during stress.  Defect 1: There is a small defect of moderate severity present in the apical anterior and apex location.  The study is normal.  This is a low risk study.  The left ventricular ejection fraction is normal (55-65%).   Normal stress nuclear study with breast attenuation but no ischemia; EF 58 with normal wall motion.         ASSESSMENT:    1. Abnormal CT of the chest   2. Thoracic aortic aneurysm without rupture ([place])   3. Hyperlipidemia, unspecified hyperlipidemia type   4. Atherosclerosis of aorta ([place])   5. DOE (dyspnea on exertion)      PLAN:  In order of problems listed above:  Abnormal CT of the chest -normal nuclear stress test 06/2016  Thoracic aortic aneurysm on CT mildly dilated at 4 cm stable in 2017 and 05/2016-to see Dr. Leeanne Deed in October who will schedule her next CT  Hyperlipidemia LDL 107 on low-dose Crestor.  Dr. Meda Coffee did not advise making any further changes.  Avoid processed foods.  Borderline hypertension-blood pressure mildly elevated today.  She does have a cuff at home and will be keeping track of it.  2 g sodium diet.  She will call us if it stays above 135/85.  Otherwise follow-up with Dr. Meda Coffee in 1 year.    Medication Adjustments/Labs and Tests Ordered: Current medicines are reviewed at length with the patient today.  Concerns regarding medicines are outlined above.  Medication changes, Labs and Tests ordered today are listed in the Patient Instructions below. There are no Patient Instructions on file for this visit.   Sumner Boast, PA-C  10/10/2017 10:30 AM    [facility] [address], [place], [place]  [address] Phone: [phone]; Fax: [phone]

## 2017-10-10 NOTE — Patient Instructions (Addendum)
Medication Instructions:   START BACK TAKING ASPIRIN 81 MG ONCE A DY    If you need a refill on your cardiac medications before your next appointment, please call your pharmacy.  Labwork: NONE ORDERED  TODAY     Testing/Procedures: NONE ORDERED  TODAY    Follow-Up: Your physician wants you to follow-up in: [place] will receive a reminder letter in the mail two months in advance. If you don't receive a letter, please call our office to schedule the follow-up appointment.     Any Other Special Instructions Will Be Listed Below (If Applicable).  MONITOR BLOOD PRESSURE TO MAINTAIN 135/85                                                                   Low-Sodium Eating Plan Sodium, which is an element that makes up salt, helps you maintain a healthy balance of fluids in your body. Too much sodium can increase your blood pressure and cause fluid and waste to be held in your body. Your health care provider or dietitian may recommend following this plan if you have high blood pressure (hypertension), kidney disease, liver disease, or heart failure. Eating less sodium can help lower your blood pressure, reduce swelling, and protect your heart, liver, and kidneys. What are tips for following this plan? General guidelines  Most people on this plan should limit their sodium intake to 1,500-2,000 mg (milligrams) of sodium each day. Reading food labels  The Nutrition Facts label lists the amount of sodium in one serving of the food. If you eat more than one serving, you must multiply the listed amount of sodium by the number of servings.  Choose foods with less than 140 mg of sodium per serving.  Avoid foods with 300 mg of sodium or more per serving. Shopping  Look for lower-sodium products, often labeled as "low-sodium" or "no salt added."  Always check the sodium content even if foods are labeled as "unsalted" or "no salt added".  Buy fresh foods. ? Avoid  canned foods and premade or frozen meals. ? Avoid canned, cured, or processed meats  Buy breads that have less than 80 mg of sodium per slice. Cooking  Eat more home-cooked food and less restaurant, buffet, and fast food.  Avoid adding salt when cooking. Use salt-free seasonings or herbs instead of table salt or sea salt. Check with your health care provider or pharmacist before using salt substitutes.  Cook with plant-based oils, such as canola, sunflower, or olive oil. Meal planning  When eating at a restaurant, ask that your food be prepared with less salt or no salt, if possible.  Avoid foods that contain MSG (monosodium glutamate). MSG is sometimes added to [place] food, bouillon, and some canned foods. What foods are recommended? The items listed may not be a complete list. Talk with your dietitian about what dietary choices are best for you. Grains Low-sodium cereals, including oats, puffed wheat and rice, and shredded wheat. Low-sodium crackers. Unsalted rice. Unsalted pasta. Low-sodium bread. Whole-grain breads and whole-grain pasta. Vegetables Fresh or frozen vegetables. "No salt added" canned vegetables. "No salt added" tomato sauce and paste. Low-sodium or reduced-sodium tomato and vegetable juice. Fruits Fresh, frozen, or canned fruit. Fruit juice.  Meats and other protein foods Fresh or frozen (no salt added) meat, poultry, seafood, and fish. Low-sodium canned tuna and salmon. Unsalted nuts. Dried peas, beans, and lentils without added salt. Unsalted canned beans. Eggs. Unsalted nut butters. Dairy Milk. Soy milk. Cheese that is naturally low in sodium, such as ricotta cheese, fresh mozzarella, or Swiss cheese Low-sodium or reduced-sodium cheese. Cream cheese. Yogurt. Fats and oils Unsalted butter. Unsalted margarine with no trans fat. Vegetable oils such as canola or olive oils. Seasonings and other foods Fresh and dried herbs and spices. Salt-free seasonings. Low-sodium  mustard and ketchup. Sodium-free salad dressing. Sodium-free light mayonnaise. Fresh or refrigerated horseradish. Lemon juice. Vinegar. Homemade, reduced-sodium, or low-sodium soups. Unsalted popcorn and pretzels. Low-salt or salt-free chips. What foods are not recommended? The items listed may not be a complete list. Talk with your dietitian about what dietary choices are best for you. Grains Instant hot cereals. Bread stuffing, pancake, and biscuit mixes. Croutons. Seasoned rice or pasta mixes. Noodle soup cups. Boxed or frozen macaroni and cheese. Regular salted crackers. Self-rising flour. Vegetables Sauerkraut, pickled vegetables, and relishes. Olives. [place] fries. Onion rings. Regular canned vegetables (not low-sodium or reduced-sodium). Regular canned tomato sauce and paste (not low-sodium or reduced-sodium). Regular tomato and vegetable juice (not low-sodium or reduced-sodium). Frozen vegetables in sauces. Meats and other protein foods Meat or fish that is salted, canned, smoked, spiced, or pickled. Bacon, ham, sausage, hotdogs, corned beef, chipped beef, packaged lunch meats, salt pork, jerky, pickled herring, anchovies, regular canned tuna, sardines, salted nuts. Dairy Processed cheese and cheese spreads. Cheese curds. Blue cheese. Feta cheese. String cheese. Regular cottage cheese. Buttermilk. Canned milk. Fats and oils Salted butter. Regular margarine. Ghee. Bacon fat. Seasonings and other foods Onion salt, garlic salt, seasoned salt, table salt, and sea salt. Canned and packaged gravies. Worcestershire sauce. Tartar sauce. Barbecue sauce. Teriyaki sauce. Soy sauce, including reduced-sodium. Steak sauce. Fish sauce. Oyster sauce. Cocktail sauce. Horseradish that you find on the shelf. Regular ketchup and mustard. Meat flavorings and tenderizers. Bouillon cubes. Hot sauce and Tabasco sauce. Premade or packaged marinades. Premade or packaged taco seasonings. Relishes. Regular salad  dressings. Salsa. Potato and tortilla chips. Corn chips and puffs. Salted popcorn and pretzels. Canned or dried soups. Pizza. Frozen entrees and pot pies. Summary  Eating less sodium can help lower your blood pressure, reduce swelling, and protect your heart, liver, and kidneys.  Most people on this plan should limit their sodium intake to 1,500-2,000 mg (milligrams) of sodium each day.  Canned, boxed, and frozen foods are high in sodium. Restaurant foods, fast foods, and pizza are also very high in sodium. You also get sodium by adding salt to food.  Try to cook at home, eat more fresh fruits and vegetables, and eat less fast food, canned, processed, or prepared foods. This information is not intended to replace advice given to you by your health care provider. Make sure you discuss any questions you have with your health care provider. Document Released: 09/10/2001 Document Revised: 03/14/2016 Document Reviewed: 03/14/2016 Elsevier Interactive Patient Education  [employer].

## 2017-10-26 ENCOUNTER — Other Ambulatory Visit: Payer: Self-pay | Admitting: Pulmonary Disease

## 2017-10-27 ENCOUNTER — Other Ambulatory Visit: Payer: Self-pay | Admitting: Pulmonary Disease

## 2017-11-06 ENCOUNTER — Emergency Department (HOSPITAL_COMMUNITY)

## 2017-11-06 ENCOUNTER — Observation Stay (HOSPITAL_COMMUNITY)
Admission: EM | Admit: 2017-11-06 | Discharge: 2017-11-08 | Disposition: A | Discharge status: Home / Self Care | Attending: Internal Medicine | Admitting: Internal Medicine

## 2017-11-06 ENCOUNTER — Other Ambulatory Visit: Payer: Self-pay

## 2017-11-06 ENCOUNTER — Encounter (HOSPITAL_COMMUNITY): Payer: Self-pay | Admitting: Emergency Medicine

## 2017-11-06 DIAGNOSIS — Z7982 Long term (current) use of aspirin: Secondary | ICD-10-CM | POA: Diagnosis not present

## 2017-11-06 DIAGNOSIS — R0609 Other forms of dyspnea: Secondary | ICD-10-CM

## 2017-11-06 DIAGNOSIS — F419 Anxiety disorder, unspecified: Secondary | ICD-10-CM | POA: Insufficient documentation

## 2017-11-06 DIAGNOSIS — Z9889 Other specified postprocedural states: Secondary | ICD-10-CM | POA: Insufficient documentation

## 2017-11-06 DIAGNOSIS — R0600 Dyspnea, unspecified: Secondary | ICD-10-CM

## 2017-11-06 DIAGNOSIS — R55 Syncope and collapse: Secondary | ICD-10-CM | POA: Diagnosis not present

## 2017-11-06 DIAGNOSIS — Z8249 Family history of ischemic heart disease and other diseases of the circulatory system: Secondary | ICD-10-CM | POA: Diagnosis not present

## 2017-11-06 DIAGNOSIS — Z7951 Long term (current) use of inhaled steroids: Secondary | ICD-10-CM | POA: Diagnosis not present

## 2017-11-06 DIAGNOSIS — J432 Centrilobular emphysema: Secondary | ICD-10-CM | POA: Insufficient documentation

## 2017-11-06 DIAGNOSIS — I249 Acute ischemic heart disease, unspecified: Secondary | ICD-10-CM

## 2017-11-06 DIAGNOSIS — I10 Essential (primary) hypertension: Secondary | ICD-10-CM | POA: Diagnosis not present

## 2017-11-06 DIAGNOSIS — Z87891 Personal history of nicotine dependence: Secondary | ICD-10-CM | POA: Diagnosis not present

## 2017-11-06 DIAGNOSIS — I2584 Coronary atherosclerosis due to calcified coronary lesion: Secondary | ICD-10-CM | POA: Insufficient documentation

## 2017-11-06 DIAGNOSIS — Z825 Family history of asthma and other chronic lower respiratory diseases: Secondary | ICD-10-CM | POA: Diagnosis not present

## 2017-11-06 DIAGNOSIS — I700 Atherosclerosis of aorta: Secondary | ICD-10-CM | POA: Diagnosis not present

## 2017-11-06 DIAGNOSIS — I214 Non-ST elevation (NSTEMI) myocardial infarction: Secondary | ICD-10-CM | POA: Diagnosis present

## 2017-11-06 DIAGNOSIS — E785 Hyperlipidemia, unspecified: Secondary | ICD-10-CM | POA: Diagnosis not present

## 2017-11-06 DIAGNOSIS — I2510 Atherosclerotic heart disease of native coronary artery without angina pectoris: Secondary | ICD-10-CM | POA: Diagnosis not present

## 2017-11-06 DIAGNOSIS — Z79899 Other long term (current) drug therapy: Secondary | ICD-10-CM | POA: Diagnosis not present

## 2017-11-06 DIAGNOSIS — R9389 Abnormal findings on diagnostic imaging of other specified body structures: Secondary | ICD-10-CM

## 2017-11-06 DIAGNOSIS — I712 Thoracic aortic aneurysm, without rupture: Secondary | ICD-10-CM | POA: Insufficient documentation

## 2017-11-06 DIAGNOSIS — F329 Major depressive disorder, single episode, unspecified: Secondary | ICD-10-CM | POA: Insufficient documentation

## 2017-11-06 DIAGNOSIS — I209 Angina pectoris, unspecified: Secondary | ICD-10-CM | POA: Diagnosis present

## 2017-11-06 LAB — BASIC METABOLIC PANEL
Anion gap: 11 (ref 5–15)
BUN: 6 mg/dL — ABNORMAL LOW (ref 8–23)
CO2: 25 mmol/L (ref 22–32)
CREATININE: 0.81 mg/dL (ref 0.44–1.00)
Calcium: 9.7 mg/dL (ref 8.9–10.3)
Chloride: 104 mmol/L (ref 98–111)
GFR calc non Af Amer: >60 mL/min (ref >60)
Glucose, Bld: 128 mg/dL — ABNORMAL HIGH (ref 70–99)
Potassium: 3.9 mmol/L (ref 3.5–5.1)
SODIUM: 140 mmol/L (ref 135–145)

## 2017-11-06 LAB — I-STAT TROPONIN, ED: TROPONIN I, POC: 0 ng/mL (ref 0.00–0.08)

## 2017-11-06 LAB — CBC WITH DIFFERENTIAL/PLATELET
Abs Immature Granulocytes: 0 10*3/uL (ref 0.0–0.1)
BASOS ABS: 0 10*3/uL (ref 0.0–0.1)
BASOS PCT: 0 %
EOS ABS: 0.1 10*3/uL (ref 0.0–0.7)
Eosinophils Relative: 1 %
HCT: 41.8 % (ref 36.0–46.0)
Hemoglobin: 13.7 g/dL (ref 12.0–15.0)
IMMATURE GRANULOCYTES: 0 %
LYMPHS ABS: 1.8 10*3/uL (ref 0.7–4.0)
Lymphocytes Relative: 19 %
MCH: 30.2 pg (ref 26.0–34.0)
MCHC: 32.8 g/dL (ref 30.0–36.0)
MCV: 92.1 fL (ref 78.0–100.0)
Monocytes Absolute: 0.5 10*3/uL (ref 0.1–1.0)
Monocytes Relative: 5 %
NEUTROS PCT: 75 %
Neutro Abs: 6.8 10*3/uL (ref 1.7–7.7)
PLATELETS: 191 10*3/uL (ref 150–400)
RBC: 4.54 MIL/uL (ref 3.87–5.11)
RDW: 13.1 % (ref 11.5–15.5)
WBC: 9.2 10*3/uL (ref 4.0–10.5)

## 2017-11-06 NOTE — ED Triage Notes (Signed)
Pt to ED via [facility] with c/o chest pain and shortness of breath.  Enroute to ED pt was given ASA 324 and one NTG SL.  After approx 10 mins pt had a syncopal episode and what appeared to be V-tach on monitor.  Pt then vomited.  Pt rates chest pain at this time a 6 on scale of 0/10

## 2017-11-06 NOTE — ED Provider Notes (Signed)
[facility] EMERGENCY DEPARTMENT Provider Note   CSN: [id number] Arrival date & time: 11/06/17  2144    History   Chief Complaint Chief Complaint  Patient presents with  . Chest Pain    HPI Katrina Campbell is a 62 y.o. female.  62 y/o female with hx of COPD, HLD, labile HTN, and thoracic AA presents to the ED for complaints of chest pain. Patient states that she had eaten a carrot at 1700 and then went to walk on her treadmill which she does for 20 minutes every day. Patient states that she began to feel increasingly dyspneic and "couldn't get past the first stage" as she usually does. Prior cardiology notes reference some baseline DOE, but this today felt different. She got off the treadmill and went to the bathroom, felt lightheaded when walking back to sit in a chair. The patient endorses associated sharp, central, substernal, nonradiating chest pain. This has improved to a "soreness" in her chest and shoulders. She felt diaphoretic. Received 324mg  ASA and 1 SL NTG via EMS. After receiving NTG, patient had a syncopal event lasting ~90 seconds; vomited after regaining consciousness. EMS is unsure, but feel patient had a run of V-tach on the monitor while in transport. No preceding fever or URI symptoms. No leg swelling or prior hx of ACS.  The history is provided by the patient. No language interpreter was used.  Chest Pain      Past Medical History:  Diagnosis Date  . Anxiety   . COPD (chronic obstructive pulmonary disease) ([place])   . Depression   . Hyperlipidemia   . Labile hypertension     Patient Active Problem List   Diagnosis Date Noted  . Chest pain 11/07/2017  . COPD with acute exacerbation ([place]) 03/09/2017  . Dysthymia 03/09/2017  . DOE (dyspnea on exertion) 06/23/2016  . Vitamin D deficiency 12/30/2015  . Thoracic aortic aneurysm ([place]) 12/30/2015  . Atherosclerosis of aorta ([place]) 12/30/2015  . Osteopenia 12/30/2015  . Abnormal CT of the chest  02/10/2015  . Depression   . Anxiety   . COPD (chronic obstructive pulmonary disease) ([place])   . Hyperlipidemia   . Labile hypertension     Past Surgical History:  Procedure Laterality Date  . NASAL SINUS SURGERY  1986     OB History   None      Home Medications    Prior to Admission medications   Medication Sig Start Date End Date Taking? Authorizing Provider  ADVAIR DISKUS 250-50 MCG/DOSE AEPB USE 1 INHALATION TWICE A DAY 10/27/17  Yes Noralee Space, MD  albuterol (PROAIR HFA) 108 (90 BASE) MCG/ACT inhaler Inhale 2 puffs into the lungs every 6 (six) hours as needed for wheezing or shortness of breath. 12/10/14  Yes [name], PA-C  aspirin EC 81 MG tablet Take 81 mg by mouth daily.   Yes [provider]  buPROPion (WELLBUTRIN XL) 300 MG 24 hr tablet TAKE 1 TABLET DAILY 07/11/17  Yes [place], Modena Nunnery, MD  Calcium Carb-Cholecalciferol (CALCIUM-VITAMIN D) 500-400 MG-UNIT TABS 1 a day 12/28/16  Yes [place], Modena Nunnery, MD  cholecalciferol (VITAMIN D) 1000 units tablet Take 2,000 Units by mouth daily.   Yes [provider]  fluticasone (FLONASE) 50 MCG/ACT nasal spray USE 1 SPRAY IN EACH NOSTRIL TWICE A DAY AS NEEDED FOR ALLERGIES OR RHINITIS 09/25/17  Yes [place], Modena Nunnery, MD  guaiFENesin (MUCINEX) 600 MG 12 hr tablet Take 1,200 mg by mouth 2 (two) times daily  as needed for to loosen phlegm.    Yes [provider]  hydrOXYzine (ATARAX/VISTARIL) 10 MG tablet Take 1 tablet (10 mg total) by mouth every 6 (six) hours as needed for itching (itching, burning rash). 08/08/17  Yes Delsa Grana, PA-C  ibuprofen (ADVIL) 200 MG tablet Take 400 mg by mouth every 8 (eight) hours as needed for fever.   Yes [provider]  INCRUSE ELLIPTA 62.5 MCG/INH AEPB USE 1 INHALATION DAILY (DISCARD 6 WEEKS AFTER OPENING FOIL TRAY) 10/26/17  Yes Noralee Space, MD  MAGNESIUM PO Take 1 tablet by mouth daily.    Yes [provider]  Omega-3 Fatty Acids (FISH OIL PO) Take  500 mg by mouth daily.   Yes [provider]  rosuvastatin (CRESTOR) 10 MG tablet Take 1 tablet (10 mg total) by mouth 3 (three) times a week. 10/11/17  Yes Imogene Burn, PA-C  vitamin C (ASCORBIC ACID) 500 MG tablet Take 500 mg by mouth every morning.   Yes [provider]  predniSONE (DELTASONE) 10 MG tablet Take 40 mg x1 day, then 30 mg x1 day, then 20 mg x1 day, then 10 mg x1 day, and then 5 mg x1 day and stop Patient not taking: Reported on 11/07/2017 09/06/17   Brand Males, MD    Family History Family History  Problem Relation Age of Onset  . Heart attack Mother   . Emphysema Mother   . Congestive Heart Failure Mother   . Heart attack Father   . Congestive Heart Failure Father   . Asthma Other   . Lung disease Sister   . Lung disease Brother   . Colon cancer Neg Hx     Social History Social History   Tobacco Use  . Smoking status: Former Smoker    Packs/day: 1.00    Years: 40.00    Pack years: 40.00    Types: Cigarettes    Last attempt to quit: 11/09/2012    Years since quitting: 4.9  . Smokeless tobacco: Never Used  . Tobacco comment: vapor  Substance Use Topics  . Alcohol use: Yes    Alcohol/week: 14.4 oz    Types: 24 Cans of beer per week  . Drug use: No     Allergies   Patient has no known allergies.   Review of Systems Review of Systems  Cardiovascular: Positive for chest pain.  Ten systems reviewed and are negative for acute change, except as noted in the HPI.     Physical Exam Updated Vital Signs BP 130/90   Pulse 81   Temp 99 F (37.2 C) (Oral)   Resp 16   Ht 5\' 4"  (1.626 m)   Wt 60.8 kg (134 lb)   LMP  (Exact Date)   SpO2 99%   BMI 23.00 kg/m   Physical Exam  Constitutional: She is oriented to person, place, and time. She appears well-developed and well-nourished. No distress.  Nontoxic appearing and in NAD  HENT:  Head: Normocephalic and atraumatic.  Eyes: Conjunctivae and EOM are normal. No scleral icterus.    Neck: Normal range of motion.  Cardiovascular: Normal rate, regular rhythm and intact distal pulses.  Pulmonary/Chest: Effort normal. No stridor. No respiratory distress. She has no wheezes.  Slightly decreased BS throughout, but otherwise clear  Abdominal: Soft. She exhibits no distension.  Musculoskeletal: Normal range of motion.  Neurological: She is alert and oriented to person, place, and time. She exhibits normal muscle tone. Coordination normal.  Skin: Skin  is warm and dry. No rash noted. She is not diaphoretic. No erythema. No pallor.  Psychiatric: Her behavior is normal. Her mood appears anxious.  Nursing note and vitals reviewed.    ED Treatments / Results  Labs (all labs ordered are listed, but only abnormal results are displayed) Labs Reviewed  BASIC METABOLIC PANEL - Abnormal; Notable for the following components:      Result Value   Glucose, Bld 128 (*)    BUN 6 (*)    All other components within normal limits  I-STAT TROPONIN, ED - Abnormal; Notable for the following components:   Troponin i, poc 0.13 (*)    All other components within normal limits  CBC WITH DIFFERENTIAL/PLATELET  HEPARIN LEVEL (UNFRACTIONATED)  I-STAT TROPONIN, ED    EKG EKG Interpretation  Date/Time:  Monday November 06 2017 21:50:11 EDT Ventricular Rate:  73 PR Interval:    QRS Duration: 94 QT Interval:  400 QTC Calculation: 441 R Axis:   48 Text Interpretation:  Sinus rhythm Anterior infarct, old NO prior for comparison Confirmed by Thayer Jew [phone]) on 11/07/2017 12:56:33 AM   Radiology Dg Chest 2 View  Result Date: 11/06/2017 CLINICAL DATA:  Acute onset of generalized chest pain and shortness of breath. Syncope. Ventricular tachycardia. Vomiting. EXAM: CHEST - 2 VIEW COMPARISON:  CT of the chest performed 05/31/2016, and chest radiograph performed 05/12/2015 FINDINGS: The lungs are well-aerated and clear. There is no evidence of focal opacification, pleural effusion or  pneumothorax. The heart is normal in size; the mediastinal contour is within normal limits. No acute osseous abnormalities are seen. External pacing pads are noted. IMPRESSION: No acute cardiopulmonary process seen. Electronically Signed   By: Garald Balding M.D.   On: 11/06/2017 22:38   Ct Angio Chest Aorta W And/or Wo Contrast  Result Date: 11/07/2017 CLINICAL DATA:  Chest pain and dyspnea EXAM: CT ANGIOGRAPHY CHEST WITH CONTRAST TECHNIQUE: Multidetector CT imaging of the chest was performed using the standard protocol during bolus administration of intravenous contrast. Multiplanar CT image reconstructions and MIPs were obtained to evaluate the vascular anatomy. CONTRAST:  135mL ISOVUE-370 IOPAMIDOL (ISOVUE-370) INJECTION 76% COMPARISON:  05/31/2016 FINDINGS: Cardiovascular: Satisfactory opacification of the pulmonary arteries to the segmental level without pulmonary embolus. Ectatic, atherosclerotic ascending thoracic aorta to 3.8 cm. No dissection. Normal heart size with coronary arteriosclerosis at the proximal aspect of the LAD. No pericardial effusion. Patent great vessels with conventional branch pattern. Mediastinum/Nodes: No mediastinal nor hilar adenopathy. Patent midline trachea and mainstem bronchi. Esophagus is unremarkable. Lungs/Pleura: Centrilobular emphysema is noted bilaterally with scarring at the left lung apex. 6.6 mm in average nodular subpleural densities seen in the right upper lobe. No pulmonary consolidation. Additional 5 mm nodule in the left lower lobe is also seen, series 6/135. Probable atelectasis posteriorly in the right lower lobe with stable atelectasis and/or scarring in the lingula. No effusion or pneumothorax. Upper Abdomen: No acute abnormality. Musculoskeletal: No chest wall abnormality. No acute or significant osseous findings. Review of the MIP images confirms the above findings. IMPRESSION: 1. Stable mild ectasia of the ascending thoracic aorta to 3.8 cm. No dissection  or aneurysm. 2. No acute pulmonary embolus. 3. Nodular densities in the right upper lobe and left lung base measuring 6.6 mm and 5 mm respectively. These are new since prior exam. Non-contrast chest CT at 3-6 months is recommended. If the nodules are stable at time of repeat CT, then future CT at 18-24 months (from today's scan) is considered optional for  low-risk patients, but is recommended for high-risk patients. This recommendation follows the consensus statement: Guidelines for Management of Incidental Pulmonary Nodules Detected on CT Images: From the Fleischner Society 2017; Radiology 2017; 284:228-243. 4. Coronary arteriosclerosis. 5. Centrilobular emphysema is redemonstrated. Aortic Atherosclerosis (ICD10-I70.0). Electronically Signed   By: Ashley Royalty M.D.   On: 11/07/2017 01:50    Procedures Procedures (including critical care time)  Medications Ordered in ED Medications  heparin bolus via infusion 3,000 Units (has no administration in time range)  heparin ADULT infusion 100 units/mL (25000 units/250mL sodium chloride 0.45%) (has no administration in time range)  iopamidol (ISOVUE-370) 76 % injection 100 mL (100 mLs Intravenous Contrast Given 11/07/17 0100)    CRITICAL CARE Performed by: Antonietta Breach   Total critical care time: 35 minutes  Critical care time was exclusive of separately billable procedures and treating other patients.  Critical care was necessary to treat or prevent imminent or life-threatening deterioration.  Critical care was time spent personally by me on the following activities: development of treatment plan with patient and/or surrogate as well as nursing, discussions with consultants, evaluation of patient's response to treatment, examination of patient, obtaining history from patient or surrogate, ordering and performing treatments and interventions, ordering and review of laboratory studies, ordering and review of radiographic studies, pulse oximetry and  re-evaluation of patient's condition.   Initial Impression / Assessment and Plan / ED Course  I have reviewed the triage vital signs and the nursing notes.  Pertinent labs & imaging results that were available during my care of the patient were reviewed by me and considered in my medical decision making (see chart for details).     62 year old female presents to the emergency department for evaluation of chest pain and shortness of breath which began while walking on her treadmill.  Patient does this daily.  She was given 324mg  aspirin by EMS.  She was also given 1 tablet of sublingual nitroglycerin, but syncopized after receiving it.  Patient hemodynamically stable in the emergency department.  Troponin has been rising.  With concerning story for ACS, patient started on heparin.  Will admit to hospitalist service for further management; anticipate AM cardiology consultation.  Vitals:   11/07/17 0145 11/07/17 0200 11/07/17 0215 11/07/17 0231  BP: 111/76 113/73 111/81 130/90  Pulse: 78 73 72 81  Resp: (!) 21 (!) 23 18 16   Temp:      TempSrc:      SpO2: 97% 96% 96% 99%  Weight:      Height:        Final Clinical Impressions(s) / ED Diagnoses   Final diagnoses:  Acute coronary syndrome [facility])    ED Discharge Orders    None       Antonietta Breach, PA-C 11/07/17 0305    Merryl Hacker, MD 11/07/17 [phone]

## 2017-11-06 NOTE — ED Notes (Signed)
Pt to xray at this time.

## 2017-11-07 ENCOUNTER — Encounter (HOSPITAL_COMMUNITY)
Admission: EM | Disposition: A | Discharge status: Home / Self Care | Payer: Self-pay | Source: Home / Self Care | Attending: Emergency Medicine

## 2017-11-07 ENCOUNTER — Emergency Department (HOSPITAL_COMMUNITY)

## 2017-11-07 DIAGNOSIS — I249 Acute ischemic heart disease, unspecified: Secondary | ICD-10-CM | POA: Diagnosis not present

## 2017-11-07 DIAGNOSIS — I214 Non-ST elevation (NSTEMI) myocardial infarction: Secondary | ICD-10-CM | POA: Diagnosis not present

## 2017-11-07 DIAGNOSIS — R079 Chest pain, unspecified: Secondary | ICD-10-CM

## 2017-11-07 DIAGNOSIS — I2510 Atherosclerotic heart disease of native coronary artery without angina pectoris: Secondary | ICD-10-CM

## 2017-11-07 DIAGNOSIS — E782 Mixed hyperlipidemia: Secondary | ICD-10-CM | POA: Diagnosis not present

## 2017-11-07 DIAGNOSIS — I209 Angina pectoris, unspecified: Secondary | ICD-10-CM | POA: Diagnosis present

## 2017-11-07 DIAGNOSIS — I2584 Coronary atherosclerosis due to calcified coronary lesion: Secondary | ICD-10-CM

## 2017-11-07 HISTORY — PX: LEFT HEART CATH AND CORONARY ANGIOGRAPHY: CATH118249

## 2017-11-07 LAB — I-STAT TROPONIN, ED: Troponin i, poc: 0.13 ng/mL (ref 0.00–0.08)

## 2017-11-07 LAB — TROPONIN I
TROPONIN I: 0.04 ng/mL — AB (ref <0.03)
TROPONIN I: 0.04 ng/mL — AB (ref <0.03)
Troponin I: 0.07 ng/mL (ref <0.03)

## 2017-11-07 LAB — HIV ANTIBODY (ROUTINE TESTING W REFLEX): HIV Screen 4th Generation wRfx: NONREACTIVE

## 2017-11-07 LAB — HEPARIN LEVEL (UNFRACTIONATED): Heparin Unfractionated: 0.36 IU/mL (ref 0.30–0.70)

## 2017-11-07 SURGERY — LEFT HEART CATH AND CORONARY ANGIOGRAPHY
Anesthesia: LOCAL

## 2017-11-07 MED ORDER — UMECLIDINIUM BROMIDE 62.5 MCG/INH IN AEPB
1.0000 | INHALATION_SPRAY | Freq: Every day | RESPIRATORY_TRACT | Status: DC
  Administered 2017-11-08: 1 via RESPIRATORY_TRACT
  Filled 2017-11-07 (×2): qty 7

## 2017-11-07 MED ORDER — ONDANSETRON HCL 4 MG/2ML IJ SOLN: 4.0000 mg | Freq: Once | INTRAMUSCULAR | Status: DC | PRN

## 2017-11-07 MED ORDER — SODIUM CHLORIDE 0.9% FLUSH: 3.0000 mL | INTRAVENOUS | Status: DC | PRN

## 2017-11-07 MED ORDER — LIDOCAINE HCL (PF) 1 % IJ SOLN
INTRAMUSCULAR | Status: AC
  Filled 2017-11-07: qty 30

## 2017-11-07 MED ORDER — GUAIFENESIN ER 600 MG PO TB12: 1200.0000 mg | ORAL_TABLET | Freq: Two times a day (BID) | ORAL | Status: DC | PRN

## 2017-11-07 MED ORDER — ACETAMINOPHEN 325 MG PO TABS
650.0000 mg | ORAL_TABLET | ORAL | Status: DC | PRN
  Administered 2017-11-07 (×2): 650 mg via ORAL
  Filled 2017-11-07 (×2): qty 2

## 2017-11-07 MED ORDER — HEPARIN SODIUM (PORCINE) 1000 UNIT/ML IJ SOLN
INTRAMUSCULAR | Status: AC
  Filled 2017-11-07: qty 1

## 2017-11-07 MED ORDER — SODIUM CHLORIDE 0.9 % IV SOLN
INTRAVENOUS | Status: DC
  Administered 2017-11-07: 14:00:00 via INTRAVENOUS

## 2017-11-07 MED ORDER — HEPARIN (PORCINE) IN NACL 1000-0.9 UT/500ML-% IV SOLN
INTRAVENOUS | Status: AC
  Filled 2017-11-07: qty 1000

## 2017-11-07 MED ORDER — IOPAMIDOL (ISOVUE-370) INJECTION 76%
100.0000 mL | Freq: Once | INTRAVENOUS | Status: AC | PRN
  Administered 2017-11-07: 100 mL via INTRAVENOUS

## 2017-11-07 MED ORDER — IOHEXOL 350 MG/ML SOLN
INTRAVENOUS | Status: DC | PRN
  Administered 2017-11-07: 30 mL via INTRA_ARTERIAL

## 2017-11-07 MED ORDER — FENTANYL CITRATE (PF) 100 MCG/2ML IJ SOLN
INTRAMUSCULAR | Status: DC | PRN
  Administered 2017-11-07: 25 ug via INTRAVENOUS

## 2017-11-07 MED ORDER — MIDAZOLAM HCL 2 MG/2ML IJ SOLN
INTRAMUSCULAR | Status: AC
  Filled 2017-11-07: qty 2

## 2017-11-07 MED ORDER — ACETAMINOPHEN 500 MG PO TABS
1000.0000 mg | ORAL_TABLET | Freq: Once | ORAL | Status: AC
  Administered 2017-11-07: 1000 mg via ORAL
  Filled 2017-11-07: qty 2

## 2017-11-07 MED ORDER — LACTATED RINGERS IV SOLN: INTRAVENOUS | Status: DC

## 2017-11-07 MED ORDER — HEPARIN SODIUM (PORCINE) 1000 UNIT/ML IJ SOLN
INTRAMUSCULAR | Status: DC | PRN
  Administered 2017-11-07: 3000 [IU] via INTRAVENOUS

## 2017-11-07 MED ORDER — ASPIRIN 81 MG PO CHEW: 81.0000 mg | CHEWABLE_TABLET | ORAL | Status: DC

## 2017-11-07 MED ORDER — SODIUM CHLORIDE 0.9% FLUSH: 3.0000 mL | Freq: Two times a day (BID) | INTRAVENOUS | Status: DC

## 2017-11-07 MED ORDER — ASPIRIN EC 325 MG PO TBEC
325.0000 mg | DELAYED_RELEASE_TABLET | Freq: Every day | ORAL | Status: DC
  Administered 2017-11-07: 325 mg via ORAL
  Filled 2017-11-07: qty 1

## 2017-11-07 MED ORDER — HEPARIN BOLUS VIA INFUSION
3000.0000 [IU] | Freq: Once | INTRAVENOUS | Status: AC
  Administered 2017-11-07: 3000 [IU] via INTRAVENOUS
  Filled 2017-11-07: qty 3000

## 2017-11-07 MED ORDER — MORPHINE SULFATE (PF) 4 MG/ML IV SOLN
2.0000 mg | Freq: Once | INTRAVENOUS | Status: AC | PRN
  Administered 2017-11-07: 2 mg via INTRAVENOUS
  Filled 2017-11-07: qty 1

## 2017-11-07 MED ORDER — SODIUM CHLORIDE 0.9 % IV SOLN: 250.0000 mL | INTRAVENOUS | Status: DC | PRN

## 2017-11-07 MED ORDER — SODIUM CHLORIDE 0.9 % WEIGHT BASED INFUSION: 3.0000 mL/kg/h | INTRAVENOUS | Status: DC

## 2017-11-07 MED ORDER — HYDROXYZINE HCL 10 MG PO TABS
10.0000 mg | ORAL_TABLET | Freq: Four times a day (QID) | ORAL | Status: DC | PRN
  Filled 2017-11-07: qty 1

## 2017-11-07 MED ORDER — SODIUM CHLORIDE 0.9% FLUSH
3.0000 mL | Freq: Two times a day (BID) | INTRAVENOUS | Status: DC
  Administered 2017-11-08: 3 mL via INTRAVENOUS

## 2017-11-07 MED ORDER — OMEGA-3-ACID ETHYL ESTERS 1 G PO CAPS
1.0000 g | ORAL_CAPSULE | Freq: Every day | ORAL | Status: DC
  Administered 2017-11-07 – 2017-11-08 (×2): 1 g via ORAL
  Filled 2017-11-07 (×2): qty 1

## 2017-11-07 MED ORDER — ENOXAPARIN SODIUM 40 MG/0.4ML ~~LOC~~ SOLN
40.0000 mg | SUBCUTANEOUS | Status: DC
  Filled 2017-11-07: qty 0.4

## 2017-11-07 MED ORDER — HEPARIN (PORCINE) IN NACL 1000-0.9 UT/500ML-% IV SOLN
INTRAVENOUS | Status: DC | PRN
  Administered 2017-11-07 (×2): 500 mL

## 2017-11-07 MED ORDER — ASPIRIN 81 MG PO CHEW
81.0000 mg | CHEWABLE_TABLET | Freq: Every day | ORAL | Status: DC
  Administered 2017-11-08: 81 mg via ORAL
  Filled 2017-11-07: qty 1

## 2017-11-07 MED ORDER — MAGNESIUM OXIDE 400 (241.3 MG) MG PO TABS
ORAL_TABLET | Freq: Every day | ORAL | Status: DC
  Administered 2017-11-07 – 2017-11-08 (×2): 400 mg via ORAL
  Filled 2017-11-07 (×2): qty 1

## 2017-11-07 MED ORDER — ALBUTEROL SULFATE (2.5 MG/3ML) 0.083% IN NEBU: 2.5000 mg | INHALATION_SOLUTION | Freq: Four times a day (QID) | RESPIRATORY_TRACT | Status: DC | PRN

## 2017-11-07 MED ORDER — FENTANYL CITRATE (PF) 100 MCG/2ML IJ SOLN
INTRAMUSCULAR | Status: AC
  Filled 2017-11-07: qty 2

## 2017-11-07 MED ORDER — MIDAZOLAM HCL 2 MG/2ML IJ SOLN
INTRAMUSCULAR | Status: DC | PRN
  Administered 2017-11-07: 1 mg via INTRAVENOUS

## 2017-11-07 MED ORDER — FLUTICASONE PROPIONATE 50 MCG/ACT NA SUSP
1.0000 | Freq: Every day | NASAL | Status: DC
  Administered 2017-11-08: 1 via NASAL
  Filled 2017-11-07: qty 16

## 2017-11-07 MED ORDER — VERAPAMIL HCL 2.5 MG/ML IV SOLN
INTRAVENOUS | Status: AC
  Filled 2017-11-07: qty 2

## 2017-11-07 MED ORDER — LIDOCAINE HCL (PF) 1 % IJ SOLN
INTRAMUSCULAR | Status: DC | PRN
  Administered 2017-11-07: 2 mL

## 2017-11-07 MED ORDER — BUPROPION HCL ER (XL) 300 MG PO TB24
300.0000 mg | ORAL_TABLET | Freq: Every day | ORAL | Status: DC
  Administered 2017-11-07 – 2017-11-08 (×2): 300 mg via ORAL
  Filled 2017-11-07: qty 2
  Filled 2017-11-07: qty 1

## 2017-11-07 MED ORDER — ASPIRIN EC 81 MG PO TBEC: 81.0000 mg | DELAYED_RELEASE_TABLET | Freq: Every day | ORAL | Status: DC

## 2017-11-07 MED ORDER — MOMETASONE FURO-FORMOTEROL FUM 200-5 MCG/ACT IN AERO
2.0000 | INHALATION_SPRAY | Freq: Two times a day (BID) | RESPIRATORY_TRACT | Status: DC
  Administered 2017-11-08: 2 via RESPIRATORY_TRACT
  Filled 2017-11-07 (×2): qty 8.8

## 2017-11-07 MED ORDER — SODIUM CHLORIDE 0.9 % IV SOLN: INTRAVENOUS | Status: AC

## 2017-11-07 MED ORDER — SODIUM CHLORIDE 0.9 % WEIGHT BASED INFUSION: 1.0000 mL/kg/h | INTRAVENOUS | Status: DC

## 2017-11-07 MED ORDER — ROSUVASTATIN CALCIUM 10 MG PO TABS
10.0000 mg | ORAL_TABLET | ORAL | Status: DC
  Administered 2017-11-07: 10 mg via ORAL
  Filled 2017-11-07: qty 1

## 2017-11-07 MED ORDER — HEPARIN (PORCINE) IN NACL 100-0.45 UNIT/ML-% IJ SOLN
750.0000 [IU]/h | INTRAMUSCULAR | Status: DC
  Administered 2017-11-07: 750 [IU]/h via INTRAVENOUS
  Filled 2017-11-07: qty 250

## 2017-11-07 MED ORDER — VERAPAMIL HCL 2.5 MG/ML IV SOLN
INTRAVENOUS | Status: DC | PRN
  Administered 2017-11-07: 10 mL via INTRA_ARTERIAL

## 2017-11-07 MED ORDER — ONDANSETRON HCL 4 MG/2ML IJ SOLN: 4.0000 mg | Freq: Four times a day (QID) | INTRAMUSCULAR | Status: DC | PRN

## 2017-11-07 SURGICAL SUPPLY — 9 items
CATH 5FR JL3.5 JR4 ANG PIG MP (CATHETERS) ×2 IMPLANT
DEVICE RAD TR BAND REGULAR (VASCULAR PRODUCTS) ×2 IMPLANT
GLIDESHEATH SLEND SS 6F .021 (SHEATH) ×2 IMPLANT
GUIDEWIRE INQWIRE 1.5J.035X260 (WIRE) ×1 IMPLANT
INQWIRE 1.5J .035X260CM (WIRE) ×2
KIT HEART LEFT (KITS) ×2 IMPLANT
PACK CARDIAC CATHETERIZATION (CUSTOM PROCEDURE TRAY) ×2 IMPLANT
TRANSDUCER W/STOPCOCK (MISCELLANEOUS) ×2 IMPLANT
TUBING CIL FLEX 10 FLL-RA (TUBING) ×2 IMPLANT

## 2017-11-07 NOTE — Progress Notes (Signed)
ANTICOAGULATION CONSULT NOTE - Initial Consult  Pharmacy Consult for Heparin Indication: chest pain/ACS  No Known Allergies  Patient Measurements: Height: 5\' 4"  (162.6 cm) Weight: 134 lb (60.8 kg) IBW/kg (Calculated) : 54.7  Vital Signs: Temp: 99 F (37.2 C) (08/05 2219) Temp Source: Oral (08/05 2219) BP: 113/73 (08/06 0200) Pulse Rate: 73 (08/06 0200)  Labs: Recent Labs    11/06/17 2214  HGB 13.7  HCT 41.8  PLT 191  CREATININE 0.81    Estimated Creatinine Clearance: 60.6 mL/min (by C-G formula based on SCr of 0.81 mg/dL).   Medical History: Past Medical History:  Diagnosis Date  . Anxiety   . COPD (chronic obstructive pulmonary disease) ([place])   . Depression   . Hyperlipidemia   . Labile hypertension     Medications:  No current facility-administered medications on file prior to encounter.    Current Outpatient Medications on File Prior to Encounter  Medication Sig Dispense Refill  . ADVAIR DISKUS 250-50 MCG/DOSE AEPB USE 1 INHALATION TWICE A DAY 180 each 0  . albuterol (PROAIR HFA) 108 (90 BASE) MCG/ACT inhaler Inhale 2 puffs into the lungs every 6 (six) hours as needed for wheezing or shortness of breath. 18 g 3  . aspirin EC 81 MG tablet Take 81 mg by mouth daily.    [place] Kitchen buPROPion (WELLBUTRIN XL) 300 MG 24 hr tablet TAKE 1 TABLET DAILY 90 tablet 1  . Calcium Carb-Cholecalciferol (CALCIUM-VITAMIN D) 500-400 MG-UNIT TABS 1 a day 60 tablet   . cholecalciferol (VITAMIN D) 1000 units tablet Take 2,000 Units by mouth daily.    . fluticasone (FLONASE) 50 MCG/ACT nasal spray USE 1 SPRAY IN EACH NOSTRIL TWICE A DAY AS NEEDED FOR ALLERGIES OR RHINITIS 48 g 0  . guaiFENesin (MUCINEX) 600 MG 12 hr tablet Take 1,200 mg by mouth 2 (two) times daily as needed for to loosen phlegm.     . hydrOXYzine (ATARAX/VISTARIL) 10 MG tablet Take 1 tablet (10 mg total) by mouth every 6 (six) hours as needed for itching (itching, burning rash). 28 tablet 0  . ibuprofen (ADVIL) 200 MG  tablet Take 400 mg by mouth every 8 (eight) hours as needed for fever.    . INCRUSE ELLIPTA 62.5 MCG/INH AEPB USE 1 INHALATION DAILY (DISCARD 6 WEEKS AFTER OPENING FOIL TRAY) 90 each 1  . MAGNESIUM PO Take 1 tablet by mouth daily.     . Omega-3 Fatty Acids (FISH OIL PO) Take 500 mg by mouth daily.    . rosuvastatin (CRESTOR) 10 MG tablet Take 1 tablet (10 mg total) by mouth 3 (three) times a week. 45 tablet 3  . vitamin C (ASCORBIC ACID) 500 MG tablet Take 500 mg by mouth every morning.    . predniSONE (DELTASONE) 10 MG tablet Take 40 mg x1 day, then 30 mg x1 day, then 20 mg x1 day, then 10 mg x1 day, and then 5 mg x1 day and stop (Patient not taking: Reported on 11/07/2017) 11 tablet 0  . [DISCONTINUED] Multiple Vitamin (MULTIVITAMIN WITH MINERALS) TABS Take 1 tablet by mouth every morning.       Assessment: 62 y.o. female with chest pain for heparin Goal of Therapy:  Heparin level 0.3-0.7 units/ml Monitor platelets by anticoagulation protocol: Yes   Plan:  Heparin 3000 units IV bolus, then start heparin 750 units/hr Check heparin level in 6 hours.    Tommi Crepeau, Bronson Curb 11/07/2017,2:38 AM

## 2017-11-07 NOTE — Progress Notes (Signed)
ANTICOAGULATION CONSULT NOTE  Pharmacy Consult for Heparin Indication: chest pain/ACS  No Known Allergies  Patient Measurements: Height: 5\' 4"  (162.6 cm) Weight: 134 lb (60.8 kg) IBW/kg (Calculated) : 54.7  Vital Signs: BP: 137/72 (08/06 1430) Pulse Rate: 68 (08/06 1430)  Labs: Recent Labs    11/06/17 2214 11/07/17 0531 11/07/17 0943  HGB 13.7  --   --   HCT 41.8  --   --   PLT 191  --   --   HEPARINUNFRC  --   --  0.36  CREATININE 0.81  --   --   TROPONINI  --  0.07* 0.04*    Estimated Creatinine Clearance: 60.6 mL/min (by C-G formula based on SCr of 0.81 mg/dL).  Assessment: 62 y.o. female continues on IV heparin for r/o ACS. Initial heparin level is therapeutic. No bleeding noted and troponin is trending down. Planning cardiac cath today.   Goal of Therapy:  Heparin level 0.3-0.7 units/ml Monitor platelets by anticoagulation protocol: Yes   Plan:  Continue heparin gtt 750 units/hr F/u post-cath plans  Teige Rountree, Rande Lawman 11/07/2017,3:10 PM

## 2017-11-07 NOTE — H&P (Signed)
History and Physical    Katrina Campbell PZW:[id number] DOB: 12-Jun-1955 DOA: 11/06/2017  PCP: Alycia Rossetti, MD  Patient coming from: Home.  Chief Complaint: Chest pain.  HPI: Katrina Campbell is a 62 y.o. female with history of COPD, previous tobacco abuse, hyperlipidemia presents to the ER with complaint of chest pain.  Patient states that last evening around 5 PM patient was eating candy when she felt that something got stuck into her esophagus.  She started having stuck up feeling in the throat and chest pain.  Had no shortness of breath.  Continued to worsen.  And patient called her PCP who advised to call EMS.  EMS on route in the [place] gave him nitroglycerin sublingual following which patient had a brief syncopal episode and recovered without any issues.  Patient states following syncopal episode patient had multiple episodes of vomiting but no abdominal pain.  ED Course: In the ER CT angiogram of the chest shows pulmonary nodules: Early atherosclerosis otherwise unremarkable.  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  Troponin was mildly elevated for which patient was started on heparin.  Patient is chest pain-free and admitted for further work-up for chest pain with positive troponin.  Review of Systems: As per HPI, rest all negative.   Past Medical History:  Diagnosis Date  . Anxiety   . COPD (chronic obstructive pulmonary disease) ([place])   . Depression   . Hyperlipidemia   . Labile hypertension     Past Surgical History:  Procedure Laterality Date  . NASAL SINUS SURGERY  1986     reports that she quit smoking about 4 years ago. Her smoking use included cigarettes. She has a 40.00 pack-year smoking history. She has never used smokeless tobacco. She reports that she drinks about 14.4 oz of alcohol per week. She reports that she does not use drugs.  No Known Allergies  Family History  Problem Relation Age of Onset  . Heart attack Mother   . Emphysema Mother   . Congestive Heart Failure  Mother   . Heart attack Father   . Congestive Heart Failure Father   . Asthma Other   . Lung disease Sister   . Lung disease Brother   . Colon cancer Neg Hx     Prior to Admission medications   Medication Sig Start Date End Date Taking? Authorizing Provider  ADVAIR DISKUS 250-50 MCG/DOSE AEPB USE 1 INHALATION TWICE A DAY 10/27/17  Yes Noralee Space, MD  albuterol (PROAIR HFA) 108 (90 BASE) MCG/ACT inhaler Inhale 2 puffs into the lungs every 6 (six) hours as needed for wheezing or shortness of breath. 12/10/14  Yes [name], PA-C  aspirin EC 81 MG tablet Take 81 mg by mouth daily.   Yes [provider]  buPROPion (WELLBUTRIN XL) 300 MG 24 hr tablet TAKE 1 TABLET DAILY 07/11/17  Yes Garnet, Modena Nunnery, MD  Calcium Carb-Cholecalciferol (CALCIUM-VITAMIN D) 500-400 MG-UNIT TABS 1 a day 12/28/16  Yes Lindy, Modena Nunnery, MD  cholecalciferol (VITAMIN D) 1000 units tablet Take 2,000 Units by mouth daily.   Yes [provider]  fluticasone (FLONASE) 50 MCG/ACT nasal spray USE 1 SPRAY IN EACH NOSTRIL TWICE A DAY AS NEEDED FOR ALLERGIES OR RHINITIS 09/25/17  Yes [place], Modena Nunnery, MD  guaiFENesin (MUCINEX) 600 MG 12 hr tablet Take 1,200 mg by mouth 2 (two) times daily as needed for to loosen phlegm.    Yes [provider]  hydrOXYzine (ATARAX/VISTARIL) 10 MG tablet Take 1 tablet (10  mg total) by mouth every 6 (six) hours as needed for itching (itching, burning rash). 08/08/17  Yes Delsa Grana, PA-C  ibuprofen (ADVIL) 200 MG tablet Take 400 mg by mouth every 8 (eight) hours as needed for fever.   Yes [provider]  INCRUSE ELLIPTA 62.5 MCG/INH AEPB USE 1 INHALATION DAILY (DISCARD 6 WEEKS AFTER OPENING FOIL TRAY) 10/26/17  Yes Noralee Space, MD  MAGNESIUM PO Take 1 tablet by mouth daily.    Yes [provider]  Omega-3 Fatty Acids (FISH OIL PO) Take 500 mg by mouth daily.   Yes [provider]  rosuvastatin (CRESTOR) 10 MG tablet Take 1 tablet (10 mg  total) by mouth 3 (three) times a week. 10/11/17  Yes Imogene Burn, PA-C  vitamin C (ASCORBIC ACID) 500 MG tablet Take 500 mg by mouth every morning.   Yes [provider]  predniSONE (DELTASONE) 10 MG tablet Take 40 mg x1 day, then 30 mg x1 day, then 20 mg x1 day, then 10 mg x1 day, and then 5 mg x1 day and stop Patient not taking: Reported on 11/07/2017 09/06/17   Brand Males, MD    Physical Exam: Vitals:   11/07/17 0245 11/07/17 0300 11/07/17 0315 11/07/17 0330  BP:  (!) 118/97  111/76  Pulse: 77 77 74 73  Resp: 20 (!) 21 20 18   Temp:      TempSrc:      SpO2: 96% 97% 97% 96%  Weight:      Height:          Constitutional: Moderately built and nourished. Vitals:   11/07/17 0245 11/07/17 0300 11/07/17 0315 11/07/17 0330  BP:  (!) 118/97  111/76  Pulse: 77 77 74 73  Resp: 20 (!) 21 20 18   Temp:      TempSrc:      SpO2: 96% 97% 97% 96%  Weight:      Height:       Eyes: Anicteric no pallor. ENMT: No discharge from the ears eyes nose or mouth. Neck: No mass or.  No neck rigidity.  No JVD appreciated. Respiratory: No rhonchi or crepitations. Cardiovascular: S1-S2 heard no murmurs appreciated. Abdomen: Soft nontender bowel sounds present.   Musculoskeletal: No edema.  No joint effusion. Skin: No rash. Neurologic: Alert awake oriented to time place and person.  Moves all extremities. Psychiatric: Appears normal per normal affect.   Labs on Admission: I have personally reviewed following labs and imaging studies  CBC: Recent Labs  Lab 11/06/17 2214  WBC 9.2  NEUTROABS 6.8  HGB 13.7  HCT 41.8  MCV 92.1  PLT 299   Basic Metabolic Panel: Recent Labs  Lab 11/06/17 2214  NA 140  K 3.9  CL 104  CO2 25  GLUCOSE 128*  BUN 6*  CREATININE 0.81  CALCIUM 9.7   GFR: Estimated Creatinine Clearance: 60.6 mL/min (by C-G formula based on SCr of 0.81 mg/dL). Liver Function Tests: No results for input(s): AST, ALT, ALKPHOS, BILITOT, PROT, ALBUMIN in the  last 168 hours. No results for input(s): LIPASE, AMYLASE in the last 168 hours. No results for input(s): AMMONIA in the last 168 hours. Coagulation Profile: No results for input(s): INR, PROTIME in the last 168 hours. Cardiac Enzymes: No results for input(s): CKTOTAL, CKMB, CKMBINDEX, TROPONINI in the last 168 hours. BNP (last 3 results) No results for input(s): PROBNP in the last 8760 hours. HbA1C: No results for input(s): HGBA1C in the last 72 hours. CBG: No results  for input(s): GLUCAP in the last 168 hours. Lipid Profile: No results for input(s): CHOL, HDL, LDLCALC, TRIG, CHOLHDL, LDLDIRECT in the last 72 hours. Thyroid Function Tests: No results for input(s): TSH, T4TOTAL, FREET4, T3FREE, THYROIDAB in the last 72 hours. Anemia Panel: No results for input(s): VITAMINB12, FOLATE, FERRITIN, TIBC, IRON, RETICCTPCT in the last 72 hours. Urine analysis:    Component Value Date/Time   COLORURINE YELLOW 12/30/2015 1536   APPEARANCEUR CLEAR 12/30/2015 1536   LABSPEC 1.017 12/30/2015 1536   PHURINE 7.0 12/30/2015 1536   GLUCOSEU NEGATIVE 12/30/2015 1536   HGBUR NEGATIVE 12/30/2015 1536   BILIRUBINUR NEGATIVE 12/30/2015 1536   KETONESUR NEGATIVE 12/30/2015 1536   PROTEINUR NEGATIVE 12/30/2015 1536   UROBILINOGEN 0.2 07/17/2013 1024   NITRITE NEGATIVE 12/30/2015 1536   LEUKOCYTESUR NEGATIVE 12/30/2015 1536   Sepsis Labs: @LABRCNTIP (procalcitonin:4,lacticidven:4) )No results found for this or any previous visit (from the past 240 hour(s)).   Radiological Exams on Admission: Dg Chest 2 View  Result Date: 11/06/2017 CLINICAL DATA:  Acute onset of generalized chest pain and shortness of breath. Syncope. Ventricular tachycardia. Vomiting. EXAM: CHEST - 2 VIEW COMPARISON:  CT of the chest performed 05/31/2016, and chest radiograph performed 05/12/2015 FINDINGS: The lungs are well-aerated and clear. There is no evidence of focal opacification, pleural effusion or pneumothorax. The heart is  normal in size; the mediastinal contour is within normal limits. No acute osseous abnormalities are seen. External pacing pads are noted. IMPRESSION: No acute cardiopulmonary process seen. Electronically Signed   By: Garald Balding M.D.   On: 11/06/2017 22:38   Ct Angio Chest Aorta W And/or Wo Contrast  Result Date: 11/07/2017 CLINICAL DATA:  Chest pain and dyspnea EXAM: CT ANGIOGRAPHY CHEST WITH CONTRAST TECHNIQUE: Multidetector CT imaging of the chest was performed using the standard protocol during bolus administration of intravenous contrast. Multiplanar CT image reconstructions and MIPs were obtained to evaluate the vascular anatomy. CONTRAST:  157mL ISOVUE-370 IOPAMIDOL (ISOVUE-370) INJECTION 76% COMPARISON:  05/31/2016 FINDINGS: Cardiovascular: Satisfactory opacification of the pulmonary arteries to the segmental level without pulmonary embolus. Ectatic, atherosclerotic ascending thoracic aorta to 3.8 cm. No dissection. Normal heart size with coronary arteriosclerosis at the proximal aspect of the LAD. No pericardial effusion. Patent great vessels with conventional branch pattern. Mediastinum/Nodes: No mediastinal nor hilar adenopathy. Patent midline trachea and mainstem bronchi. Esophagus is unremarkable. Lungs/Pleura: Centrilobular emphysema is noted bilaterally with scarring at the left lung apex. 6.6 mm in average nodular subpleural densities seen in the right upper lobe. No pulmonary consolidation. Additional 5 mm nodule in the left lower lobe is also seen, series 6/135. Probable atelectasis posteriorly in the right lower lobe with stable atelectasis and/or scarring in the lingula. No effusion or pneumothorax. Upper Abdomen: No acute abnormality. Musculoskeletal: No chest wall abnormality. No acute or significant osseous findings. Review of the MIP images confirms the above findings. IMPRESSION: 1. Stable mild ectasia of the ascending thoracic aorta to 3.8 cm. No dissection or aneurysm. 2. No acute  pulmonary embolus. 3. Nodular densities in the right upper lobe and left lung base measuring 6.6 mm and 5 mm respectively. These are new since prior exam. Non-contrast chest CT at 3-6 months is recommended. If the nodules are stable at time of repeat CT, then future CT at 18-24 months (from today's scan) is considered optional for low-risk patients, but is recommended for high-risk patients. This recommendation follows the consensus statement: Guidelines for Management of Incidental Pulmonary Nodules Detected on CT Images: From the Fleischner Society 2017;  Radiology 2017; [id number]. 4. Coronary arteriosclerosis. 5. Centrilobular emphysema is redemonstrated. Aortic Atherosclerosis (ICD10-I70.0). Electronically Signed   By: Ashley Royalty M.D.   On: 11/07/2017 01:50    EKG: Independently reviewed.  Normal sinus rhythm with poor R wave progressions.  Assessment/Plan Active Problems:   Chest pain    1. Chest pain with positive troponin - we will cycle cardiac markers patient has been already started on heparin.  Keep patient on aspirin patient is presently chest pain-free.  Cardiology consulted.  CAT scan of the chest shows coronary arterial sclerosis.  Patient also has history of tobacco abuse previously. 2. History of COPD presently not wheezing.  Continue home inhalers. 3. Hyperlipidemia on statins. 4. Syncope likely vasovagal or brief episode of hypotension.  Patient also has vomiting.  Closely monitor in telemetry. 5. Lung nodules will need follow-up as outpatient.   DVT prophylaxis: Heparin.   Code Status: Full code. Family Communication: Discussed with patient. Disposition Plan: Home. Consults called: Cardiology. Admission status: Observation.   Rise Patience MD [facility] Pager [phone].  If 7PM-7AM, please contact night-coverage [email or web address] Password [email or web address]  11/07/2017, 4:10 AM

## 2017-11-07 NOTE — ED Notes (Signed)
Patient transported to CT 

## 2017-11-07 NOTE — Progress Notes (Signed)
Pt's primary RN called this RN for reassessment of right radial cath site with TR band applied. Primary RN has reinserted 56mL of air into TR band. Cath site now at a level 0 upon this RN's assessment. Cap refill is less than 3 seconds, no hematoma/bleeding observed, skin is warm and dry, and right radial pulse is 2+. Will continue to monitor.

## 2017-11-07 NOTE — ED Notes (Signed)
Dr. Dina Rich given critical Troponin results. Freida Busman RN notified

## 2017-11-07 NOTE — ED Notes (Signed)
Heparin infusion and bolus verified by Seth Bake, RN.

## 2017-11-07 NOTE — ED Notes (Signed)
Schorr, MD paged for Trop of 0.07. Will continue to monitor.

## 2017-11-07 NOTE — Interval H&P Note (Signed)
History and Physical Interval Note:  11/07/2017 4:56 PM  Katrina Campbell  has presented today for surgery, with the diagnosis of NSTEMI  The various methods of treatment have been discussed with the patient and family. After consideration of risks, benefits and other options for treatment, the patient has consented to  Procedure(s): LEFT HEART CATH AND CORONARY ANGIOGRAPHY (N/A) as a surgical intervention .  The patient's history has been reviewed, patient examined, no change in status, stable for surgery.  I have reviewed the patient's chart and labs.  Questions were answered to the patient's satisfaction.    Cath Lab Visit (complete for each Cath Lab visit)  Clinical Evaluation Leading to the Procedure:   ACS: Yes.    Non-ACS:  N/A  Deziree Mokry

## 2017-11-07 NOTE — Brief Op Note (Signed)
BRIEF CARDIAC CATHETERIZATION NOTE  DATE: 11/07/2017 TIME: 5:30 PM  PATIENT:  Katrina Campbell  62 y.o. female  PRE-OPERATIVE DIAGNOSIS:  NSTEMI  POST-OPERATIVE DIAGNOSIS:  Non-obstructive CAD  PROCEDURE:  Procedure(s): LEFT HEART CATH AND CORONARY ANGIOGRAPHY (N/A)  SURGEON:  Surgeon(s) and Role:    * Costella Schwarz, MD - Primary  FINDINGS: 1. Non-obstructive CAD with eccentric calcification of the ostial LAD with ~20% stenosis.  Otherwise, no significant CAD. 2. Normal LVEF and LVEDP.  RECOMMENDATIONS: 1. Continue medical therapy and risk factor modification.  Nelva Bush, MD [facility] [place] [facility] Pager: [phone]

## 2017-11-07 NOTE — H&P (View-Only) (Signed)
Cardiology Consultation:   Patient ID: Katrina Campbell; [id number]; 1955/08/07   Admit date: 11/06/2017 Date of Consult: 11/07/2017  Primary Care Provider: Alycia Rossetti, MD Primary Cardiologist: Dr. Meda Coffee    Patient Profile:   Katrina Campbell is a 62 y.o. female with a hx of coronary calcification, mildly dilated thoracic aortic aneurysm, COPD, hyperlipidemia, hypertension and anxiety who is being seen today for the evaluation of chest pain and elevated troponin at the request of Dr. Wyline Copas.  Patient had abnormal CT of chest in February 2018 showing mild ascending aortic aneurysm and age and advanced coronary artery atherosclerosis.  Follow-up stress test low risk study with excellent exercise capacity.  Patient exercise every day on treadmill.  She walks on and off incline for about 20 to 30-minute without any limitation.  She was doing well on cardiac standpoint when last seen by APP 10/2017.  History of Present Illness:   Katrina Campbell tented for sudden onset chest pain and shortness of breath.  This happened 10 minutes after eating "carrot".  Felt like something stuck with pressure-like sensation and shortness of breath.  She called her on-call PCP who advised EMS activation.  Patient received 324 mg aspirin and sublingual nitroglycerin x1.  Per ER note patient had about 30 seconds of syncope following nitroglycerin administration.  Her systolic blood pressure dropped to 60 from 158.  Reviewed from EMS report in media.  ER note indicates V. tach however review of strips in media just showed artifact -personally reviewed.  No arrhythmia noted.  Point-of-care troponin 0 0.13.  Troponin I 0.07>>0.04.  Electrolyte and kidney function normal.  CT angio of chest IMPRESSION: 1. Stable mild ectasia of the ascending thoracic aorta to 3.8 cm. No dissection or aneurysm. 2. No acute pulmonary embolus. 3. Nodular densities in the right upper lobe and left lung base measuring 6.6 mm and 5 mm respectively. These  are new since prior exam. Non-contrast chest CT at 3-6 months is recommended. If the nodules are stable at time of repeat CT, then future CT at 18-24 months (from today's scan) is considered optional for low-risk patients, but is recommended for high-risk patients. This recommendation follows the consensus statement: Guidelines for Management of Incidental Pulmonary Nodules Detected on CT Images: From the Fleischner Society 2017; Radiology 2017; 284:228-243. 4. Coronary arteriosclerosis. 5. Centrilobular emphysema is redemonstrated.  Past Medical History:  Diagnosis Date  . Anxiety   . COPD (chronic obstructive pulmonary disease) ([place])   . Depression   . Hyperlipidemia   . Labile hypertension     Past Surgical History:  Procedure Laterality Date  . NASAL SINUS SURGERY  1986    Inpatient Medications: Scheduled Meds: . aspirin EC  325 mg Oral Daily  . buPROPion  300 mg Oral Daily  . fluticasone  1 spray Each Nare Daily  . magnesium oxide   Oral Daily  . mometasone-formoterol  2 puff Inhalation BID  . omega-3 acid ethyl esters  1 g Oral Daily  . rosuvastatin  10 mg Oral Once per day on Tue Thu Sat  . umeclidinium bromide  1 puff Inhalation Daily   Continuous Infusions: . sodium chloride    . heparin 750 Units/hr (11/07/17 0306)   PRN Meds: acetaminophen, albuterol, guaiFENesin, hydrOXYzine, ondansetron (ZOFRAN) IV, ondansetron (ZOFRAN) IV  Allergies:   No Known Allergies  Social History:   Social History   Socioeconomic History  . Marital status: Married    Spouse name: Not on file  . Number of children: Not  on file  . Years of education: Not on file  . Highest education level: Not on file  Occupational History  . Not on file  Social Needs  . Financial resource strain: Not on file  . Food insecurity:    Worry: Not on file    Inability: Not on file  . Transportation needs:    Medical: Not on file    Non-medical: Not on file  Tobacco Use  . Smoking status:  Former Smoker    Packs/day: 1.00    Years: 40.00    Pack years: 40.00    Types: Cigarettes    Last attempt to quit: 11/09/2012    Years since quitting: 4.9  . Smokeless tobacco: Never Used  . Tobacco comment: vapor  Substance and Sexual Activity  . Alcohol use: Yes    Alcohol/week: 14.4 oz    Types: 24 Cans of beer per week  . Drug use: No  . Sexual activity: Not Currently  Lifestyle  . Physical activity:    Days per week: Not on file    Minutes per session: Not on file  . Stress: Not on file  Relationships  . Social connections:    Talks on phone: Not on file    Gets together: Not on file    Attends religious service: Not on file    Active member of club or organization: Not on file    Attends meetings of clubs or organizations: Not on file    Relationship status: Not on file  . Intimate partner violence:    Fear of current or ex partner: Not on file    Emotionally abused: Not on file    Physically abused: Not on file    Forced sexual activity: Not on file  Other Topics Concern  . Not on file  Social History Narrative  . Not on file    Family History:    Family History  Problem Relation Age of Onset  . Heart attack Mother   . Emphysema Mother   . Congestive Heart Failure Mother   . Heart attack Father   . Congestive Heart Failure Father   . Asthma Other   . Lung disease Sister   . Lung disease Brother   . Colon cancer Neg Hx      ROS:  Please see the history of present illness.  All other ROS reviewed and negative.     Physical Exam/Data:   Vitals:   11/07/17 0811 11/07/17 0815 11/07/17 1000 11/07/17 1149  BP: 126/89  109/65   Pulse: (!) 58 64 61   Resp: 15 (!) 24 20   Temp:      TempSrc:      SpO2: 98% 98% 93% 100%  Weight:      Height:       No intake or output data in the 24 hours ending 11/07/17 1334 Filed Weights   11/06/17 2155  Weight: 134 lb (60.8 kg)   Body mass index is 23 kg/m.  General:  Well nourished, well developed, in no  acute distress HEENT: normal Lymph: no adenopathy Neck: no JVD Endocrine:  No thryomegaly Vascular: No carotid bruits; FA pulses 2+ bilaterally without bruits  Cardiac:  normal S1, S2; RRR; no murmur. Substernal chest pressure reproducible with palpations Lungs:  clear to auscultation bilaterally, no wheezing, rhonchi or rales  Abd: soft, nontender, no hepatomegaly  Ext: no edema Musculoskeletal:  No deformities, BUE and BLE strength normal and equal Skin: warm and dry  Neuro:  CNs 2-12 intact, no focal abnormalities noted Psych:  Normal affect   EKG:  The EKG was personally reviewed and demonstrates:  NSR at rate of 73 bpm  Telemetry:  Telemetry was personally reviewed and demonstrates:  Normal sinus rhythm   Relevant CV Studies: As above  Laboratory Data:  Chemistry Recent Labs  Lab 11/06/17 2214  NA 140  K 3.9  CL 104  CO2 25  GLUCOSE 128*  BUN 6*  CREATININE 0.81  CALCIUM 9.7  GFRNONAA >60  GFRAA >60  ANIONGAP 11    No results for input(s): PROT, ALBUMIN, AST, ALT, ALKPHOS, BILITOT in the last 168 hours. Hematology Recent Labs  Lab 11/06/17 2214  WBC 9.2  RBC 4.54  HGB 13.7  HCT 41.8  MCV 92.1  MCH 30.2  MCHC 32.8  RDW 13.1  PLT 191   Cardiac Enzymes Recent Labs  Lab 11/07/17 0531 11/07/17 0943  TROPONINI 0.07* 0.04*    Recent Labs  Lab 11/06/17 2215 11/07/17 0222  TROPIPOC 0.00 0.13*    BNPNo results for input(s): BNP, PROBNP in the last 168 hours.  DDimer No results for input(s): DDIMER in the last 168 hours.  Radiology/Studies:  Dg Chest 2 View  Result Date: 11/06/2017 CLINICAL DATA:  Acute onset of generalized chest pain and shortness of breath. Syncope. Ventricular tachycardia. Vomiting. EXAM: CHEST - 2 VIEW COMPARISON:  CT of the chest performed 05/31/2016, and chest radiograph performed 05/12/2015 FINDINGS: The lungs are well-aerated and clear. There is no evidence of focal opacification, pleural effusion or pneumothorax. The heart is  normal in size; the mediastinal contour is within normal limits. No acute osseous abnormalities are seen. External pacing pads are noted. IMPRESSION: No acute cardiopulmonary process seen. Electronically Signed   By: Garald Balding M.D.   On: 11/06/2017 22:38   Ct Angio Chest Aorta W And/or Wo Contrast  Result Date: 11/07/2017 CLINICAL DATA:  Chest pain and dyspnea EXAM: CT ANGIOGRAPHY CHEST WITH CONTRAST TECHNIQUE: Multidetector CT imaging of the chest was performed using the standard protocol during bolus administration of intravenous contrast. Multiplanar CT image reconstructions and MIPs were obtained to evaluate the vascular anatomy. CONTRAST:  133mL ISOVUE-370 IOPAMIDOL (ISOVUE-370) INJECTION 76% COMPARISON:  05/31/2016 FINDINGS: Cardiovascular: Satisfactory opacification of the pulmonary arteries to the segmental level without pulmonary embolus. Ectatic, atherosclerotic ascending thoracic aorta to 3.8 cm. No dissection. Normal heart size with coronary arteriosclerosis at the proximal aspect of the LAD. No pericardial effusion. Patent great vessels with conventional branch pattern. Mediastinum/Nodes: No mediastinal nor hilar adenopathy. Patent midline trachea and mainstem bronchi. Esophagus is unremarkable. Lungs/Pleura: Centrilobular emphysema is noted bilaterally with scarring at the left lung apex. 6.6 mm in average nodular subpleural densities seen in the right upper lobe. No pulmonary consolidation. Additional 5 mm nodule in the left lower lobe is also seen, series 6/135. Probable atelectasis posteriorly in the right lower lobe with stable atelectasis and/or scarring in the lingula. No effusion or pneumothorax. Upper Abdomen: No acute abnormality. Musculoskeletal: No chest wall abnormality. No acute or significant osseous findings. Review of the MIP images confirms the above findings. IMPRESSION: 1. Stable mild ectasia of the ascending thoracic aorta to 3.8 cm. No dissection or aneurysm. 2. No acute  pulmonary embolus. 3. Nodular densities in the right upper lobe and left lung base measuring 6.6 mm and 5 mm respectively. These are new since prior exam. Non-contrast chest CT at 3-6 months is recommended. If the nodules are stable at time of repeat CT,  then future CT at 18-24 months (from today's scan) is considered optional for low-risk patients, but is recommended for high-risk patients. This recommendation follows the consensus statement: Guidelines for Management of Incidental Pulmonary Nodules Detected on CT Images: From the Fleischner Society 2017; Radiology 2017; 284:228-243. 4. Coronary arteriosclerosis. 5. Centrilobular emphysema is redemonstrated. Aortic Atherosclerosis (ICD10-I70.0). Electronically Signed   By: Ashley Royalty M.D.   On: 11/07/2017 01:50    Assessment and Plan:   1. Chest pain/NSTEMI -No prior episode.  Questionable GI in etiology versus musculoskeletal as her pain is reproducible with palpation.  Troponin trending down as above.  EKG without acute ischemic changes.  Currently chest pain-free.  Only reproducible with palpation.  No arrhythmia noted on telemetry as well as EMS strip. -We will let her eat.  Prior exercise Myoview normal in March 2018.  CTA of chest without dissection or PE.  -Continue aspirin, statin and heparin. Given coronary calcification and mid elevated enzyme will plan cath later today. Keep NPO.   2.  Syncope -Due to vasovagal/hypotension episode. Records from EMS run sheet - PTA Alert L Sit 158/100 M 101 R 16 R 94 Rm 4 15=4+5+6/NQ 12 Time      BP          P     SPO2 21:17   148/92     78    95 21:22    60/35     59    92 21:26   130/98   102   93 21:36   127/77    81    94  Dr. Meda Coffee to see today.   For questions or updates, please contact [place] Please consult [email or web address] for contact info under Cardiology/STEMI.   Mahalia Longest [place], [place]  11/07/2017 1:34 PM   The patient was seen, examined and discussed with  Bhagat,Bhavinkumar PA-C and I agree with the above.   62 y.o. female with a known hx of coronary calcification, mildly dilated thoracic aortic aneurysm, COPD, hyperlipidemia, hypertension and anxiety, previously evaluated by me, normal stress test in 2018, now presenting with chest pain with some typical and some atypical features, however ruled in for a NSTEMI, we will plan for a left cardiac cath today. Chest CT ruled out pulmonary embolism and aortic dissection, but shows new pulmonary nodules that will need to be followed. Crea is normal, no allergy to contrast. Continue rosuvastatin and aspirin, no room for ACEI or BB.  Ena Dawley, MD 11/07/2017

## 2017-11-07 NOTE — Progress Notes (Signed)
PROGRESS NOTE    Katrina Campbell  VEH:[id number] DOB: 15-Dec-1955 DOA: 11/06/2017 PCP: Alycia Rossetti, MD    Brief Narrative:  62 y.o. female with history of COPD, previous tobacco abuse, hyperlipidemia presents to the ER with complaint of chest pain.  Patient states that last evening around 5 PM patient was eating candy when she felt that something got stuck into her esophagus.  She started having stuck up feeling in the throat and chest pain.  Had no shortness of breath.  Continued to worsen.  And patient called her PCP who advised to call EMS.  EMS on route in the [place] gave him nitroglycerin sublingual following which patient had a brief syncopal episode and recovered without any issues.  Patient states following syncopal episode patient had multiple episodes of vomiting but no abdominal pain.  ED Course: In the ER CT angiogram of the chest shows pulmonary nodules: Early atherosclerosis otherwise unremarkable.  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  Troponin was mildly elevated for which patient was started on heparin.  Patient is chest pain-free and admitted for further work-up for chest pain with positive troponin.  Assessment & Plan:   Active Problems:   Chest pain   Acute coronary syndrome ([place])   1. Chest pain with positive troponin 1. Troponins trending down 2. This AM, patient denied further chest pains however noted to have bout of recurrent chest pain 3. Cardiology had been consulted and patient has since undergone heart cath 8/6, results pending. 2. History of COPD presently not wheezing.   1. Currently on minimal O2 support 2. Continue home inhalers as needed 3. Hyperlipidemia on statins 1. Continue statin as tolerated 4. Syncope likely vasovagal or brief episode of hypotension.   1. Continue to monitor on tele 2. Patient has remained stable thus far 5. Lung nodules will need follow-up as outpatient. 1. Incidental R upper and L basilar nodules noted with recommendationsfor repeat  non-contrast CT in 3-6 months. If nodules stable, then repeat CT 18-24 months  DVT prophylaxis: Heparin gtt Code Status: Full Family Communication: Pt in room, family at bedside Disposition Plan: Uncertain at this time  Consultants:   Cardiology  Procedures:   Heart cath 8/6  Antimicrobials: Anti-infectives (From admission, onward)   None       Subjective: This AM, denies further chest pains  Objective: Vitals:   11/07/17 1711 11/07/17 1716 11/07/17 1721 11/07/17 1726  BP: (!) 145/82 127/74 130/78 (!) 140/91  Pulse: 74 77 73 68  Resp: 14 16 16 14   Temp:      TempSrc:      SpO2: 97% 95% 94% 95%  Weight:      Height:       No intake or output data in the 24 hours ending 11/07/17 1731 Filed Weights   11/06/17 2155 11/07/17 1531  Weight: 60.8 kg (134 lb) 60.4 kg (133 lb 3.2 oz)    Examination:  General exam: Appears calm and comfortable  Respiratory system: Clear to auscultation. Respiratory effort normal. Cardiovascular system: S1 & S2 heard, RRR Gastrointestinal system: Abdomen is nondistended, soft and nontender. No organomegaly or masses felt. Normal bowel sounds heard. Central nervous system: Alert and oriented. No focal neurological deficits. Extremities: Symmetric 5 x 5 power. Skin: No rashes, lesions  Psychiatry: Judgement and insight appear normal. Mood & affect appropriate.   Data Reviewed: I have personally reviewed following labs and imaging studies  CBC: Recent Labs  Lab 11/06/17 2214  WBC 9.2  NEUTROABS 6.8  HGB  13.7  HCT 41.8  MCV 92.1  PLT 417   Basic Metabolic Panel: Recent Labs  Lab 11/06/17 2214  NA 140  K 3.9  CL 104  CO2 25  GLUCOSE 128*  BUN 6*  CREATININE 0.81  CALCIUM 9.7   GFR: Estimated Creatinine Clearance: 60.6 mL/min (by C-G formula based on SCr of 0.81 mg/dL). Liver Function Tests: No results for input(s): AST, ALT, ALKPHOS, BILITOT, PROT, ALBUMIN in the last 168 hours. No results for input(s): LIPASE,  AMYLASE in the last 168 hours. No results for input(s): AMMONIA in the last 168 hours. Coagulation Profile: No results for input(s): INR, PROTIME in the last 168 hours. Cardiac Enzymes: Recent Labs  Lab 11/07/17 0531 11/07/17 0943 11/07/17 1541  TROPONINI 0.07* 0.04* 0.04*   BNP (last 3 results) No results for input(s): PROBNP in the last 8760 hours. HbA1C: No results for input(s): HGBA1C in the last 72 hours. CBG: No results for input(s): GLUCAP in the last 168 hours. Lipid Profile: No results for input(s): CHOL, HDL, LDLCALC, TRIG, CHOLHDL, LDLDIRECT in the last 72 hours. Thyroid Function Tests: No results for input(s): TSH, T4TOTAL, FREET4, T3FREE, THYROIDAB in the last 72 hours. Anemia Panel: No results for input(s): VITAMINB12, FOLATE, FERRITIN, TIBC, IRON, RETICCTPCT in the last 72 hours. Sepsis Labs: No results for input(s): PROCALCITON, LATICACIDVEN in the last 168 hours.  No results found for this or any previous visit (from the past 240 hour(s)).   Radiology Studies: Dg Chest 2 View  Result Date: 11/06/2017 CLINICAL DATA:  Acute onset of generalized chest pain and shortness of breath. Syncope. Ventricular tachycardia. Vomiting. EXAM: CHEST - 2 VIEW COMPARISON:  CT of the chest performed 05/31/2016, and chest radiograph performed 05/12/2015 FINDINGS: The lungs are well-aerated and clear. There is no evidence of focal opacification, pleural effusion or pneumothorax. The heart is normal in size; the mediastinal contour is within normal limits. No acute osseous abnormalities are seen. External pacing pads are noted. IMPRESSION: No acute cardiopulmonary process seen. Electronically Signed   By: Garald Balding M.D.   On: 11/06/2017 22:38   Ct Angio Chest Aorta W And/or Wo Contrast  Result Date: 11/07/2017 CLINICAL DATA:  Chest pain and dyspnea EXAM: CT ANGIOGRAPHY CHEST WITH CONTRAST TECHNIQUE: Multidetector CT imaging of the chest was performed using the standard protocol  during bolus administration of intravenous contrast. Multiplanar CT image reconstructions and MIPs were obtained to evaluate the vascular anatomy. CONTRAST:  150mL ISOVUE-370 IOPAMIDOL (ISOVUE-370) INJECTION 76% COMPARISON:  05/31/2016 FINDINGS: Cardiovascular: Satisfactory opacification of the pulmonary arteries to the segmental level without pulmonary embolus. Ectatic, atherosclerotic ascending thoracic aorta to 3.8 cm. No dissection. Normal heart size with coronary arteriosclerosis at the proximal aspect of the LAD. No pericardial effusion. Patent great vessels with conventional branch pattern. Mediastinum/Nodes: No mediastinal nor hilar adenopathy. Patent midline trachea and mainstem bronchi. Esophagus is unremarkable. Lungs/Pleura: Centrilobular emphysema is noted bilaterally with scarring at the left lung apex. 6.6 mm in average nodular subpleural densities seen in the right upper lobe. No pulmonary consolidation. Additional 5 mm nodule in the left lower lobe is also seen, series 6/135. Probable atelectasis posteriorly in the right lower lobe with stable atelectasis and/or scarring in the lingula. No effusion or pneumothorax. Upper Abdomen: No acute abnormality. Musculoskeletal: No chest wall abnormality. No acute or significant osseous findings. Review of the MIP images confirms the above findings. IMPRESSION: 1. Stable mild ectasia of the ascending thoracic aorta to 3.8 cm. No dissection or aneurysm. 2. No acute  pulmonary embolus. 3. Nodular densities in the right upper lobe and left lung base measuring 6.6 mm and 5 mm respectively. These are new since prior exam. Non-contrast chest CT at 3-6 months is recommended. If the nodules are stable at time of repeat CT, then future CT at 18-24 months (from today's scan) is considered optional for low-risk patients, but is recommended for high-risk patients. This recommendation follows the consensus statement: Guidelines for Management of Incidental Pulmonary Nodules  Detected on CT Images: From the Fleischner Society 2017; Radiology 2017; 284:228-243. 4. Coronary arteriosclerosis. 5. Centrilobular emphysema is redemonstrated. Aortic Atherosclerosis (ICD10-I70.0). Electronically Signed   By: Ashley Royalty M.D.   On: 11/07/2017 01:50    Scheduled Meds: . [START ON 11/08/2017] aspirin  81 mg Oral Pre-Cath  . [MAR Hold] aspirin EC  325 mg Oral Daily  . [MAR Hold] buPROPion  300 mg Oral Daily  . [MAR Hold] fluticasone  1 spray Each Nare Daily  . [MAR Hold] magnesium oxide   Oral Daily  . [MAR Hold] mometasone-formoterol  2 puff Inhalation BID  . [MAR Hold] omega-3 acid ethyl esters  1 g Oral Daily  . [MAR Hold] rosuvastatin  10 mg Oral Once per day on Tue Thu Sat  . sodium chloride flush  3 mL Intravenous Q12H  . [MAR Hold] umeclidinium bromide  1 puff Inhalation Daily   Continuous Infusions: . sodium chloride 75 mL/hr at 11/07/17 1401  . sodium chloride    . [START ON 11/08/2017] sodium chloride     Followed by  . [START ON 11/08/2017] sodium chloride    . heparin 750 Units/hr (11/07/17 0306)     LOS: 0 days   Marylu Lund, MD [facility] Pager [phone]  If 7PM-7AM, please contact night-coverage [email or web address] Password [facility] 11/07/2017, 5:31 PM

## 2017-11-07 NOTE — Consult Note (Addendum)
Cardiology Consultation:   Patient ID: Katrina Campbell; [id number]; 06-27-1955   Admit date: 11/06/2017 Date of Consult: 11/07/2017  Primary Care Provider: Alycia Rossetti, MD Primary Cardiologist: Dr. Meda Coffee    Patient Profile:   Katrina Campbell is a 63 y.o. female with a hx of coronary calcification, mildly dilated thoracic aortic aneurysm, COPD, hyperlipidemia, hypertension and anxiety who is being seen today for the evaluation of chest pain and elevated troponin at the request of Dr. Wyline Copas.  Patient had abnormal CT of chest in February 2018 showing mild ascending aortic aneurysm and age and advanced coronary artery atherosclerosis.  Follow-up stress test low risk study with excellent exercise capacity.  Patient exercise every day on treadmill.  She walks on and off incline for about 20 to 30-minute without any limitation.  She was doing well on cardiac standpoint when last seen by APP 10/2017.  History of Present Illness:   [name] tented for sudden onset chest pain and shortness of breath.  This happened 10 minutes after eating "carrot".  Felt like something stuck with pressure-like sensation and shortness of breath.  She called her on-call PCP who advised EMS activation.  Patient received 324 mg aspirin and sublingual nitroglycerin x1.  Per ER note patient had about 30 seconds of syncope following nitroglycerin administration.  Her systolic blood pressure dropped to 60 from 158.  Reviewed from EMS report in media.  ER note indicates V. tach however review of strips in media just showed artifact -personally reviewed.  No arrhythmia noted.  Point-of-care troponin 0 0.13.  Troponin I 0.07>>0.04.  Electrolyte and kidney function normal.  CT angio of chest IMPRESSION: 1. Stable mild ectasia of the ascending thoracic aorta to 3.8 cm. No dissection or aneurysm. 2. No acute pulmonary embolus. 3. Nodular densities in the right upper lobe and left lung base measuring 6.6 mm and 5 mm respectively. These  are new since prior exam. Non-contrast chest CT at 3-6 months is recommended. If the nodules are stable at time of repeat CT, then future CT at 18-24 months (from today's scan) is considered optional for low-risk patients, but is recommended for high-risk patients. This recommendation follows the consensus statement: Guidelines for Management of Incidental Pulmonary Nodules Detected on CT Images: From the Fleischner Society 2017; Radiology 2017; 284:228-243. 4. Coronary arteriosclerosis. 5. Centrilobular emphysema is redemonstrated.  Past Medical History:  Diagnosis Date  . Anxiety   . COPD (chronic obstructive pulmonary disease) ([place])   . Depression   . Hyperlipidemia   . Labile hypertension     Past Surgical History:  Procedure Laterality Date  . NASAL SINUS SURGERY  1986    Inpatient Medications: Scheduled Meds: . aspirin EC  325 mg Oral Daily  . buPROPion  300 mg Oral Daily  . fluticasone  1 spray Each Nare Daily  . magnesium oxide   Oral Daily  . mometasone-formoterol  2 puff Inhalation BID  . omega-3 acid ethyl esters  1 g Oral Daily  . rosuvastatin  10 mg Oral Once per day on Tue Thu Sat  . umeclidinium bromide  1 puff Inhalation Daily   Continuous Infusions: . sodium chloride    . heparin 750 Units/hr (11/07/17 0306)   PRN Meds: acetaminophen, albuterol, guaiFENesin, hydrOXYzine, ondansetron (ZOFRAN) IV, ondansetron (ZOFRAN) IV  Allergies:   No Known Allergies  Social History:   Social History   Socioeconomic History  . Marital status: Married    Spouse name: Not on file  . Number of children: Not  on file  . Years of education: Not on file  . Highest education level: Not on file  Occupational History  . Not on file  Social Needs  . Financial resource strain: Not on file  . Food insecurity:    Worry: Not on file    Inability: Not on file  . Transportation needs:    Medical: Not on file    Non-medical: Not on file  Tobacco Use  . Smoking status:  Former Smoker    Packs/day: 1.00    Years: 40.00    Pack years: 40.00    Types: Cigarettes    Last attempt to quit: 11/09/2012    Years since quitting: 4.9  . Smokeless tobacco: Never Used  . Tobacco comment: vapor  Substance and Sexual Activity  . Alcohol use: Yes    Alcohol/week: 14.4 oz    Types: 24 Cans of beer per week  . Drug use: No  . Sexual activity: Not Currently  Lifestyle  . Physical activity:    Days per week: Not on file    Minutes per session: Not on file  . Stress: Not on file  Relationships  . Social connections:    Talks on phone: Not on file    Gets together: Not on file    Attends religious service: Not on file    Active member of club or organization: Not on file    Attends meetings of clubs or organizations: Not on file    Relationship status: Not on file  . Intimate partner violence:    Fear of current or ex partner: Not on file    Emotionally abused: Not on file    Physically abused: Not on file    Forced sexual activity: Not on file  Other Topics Concern  . Not on file  Social History Narrative  . Not on file    Family History:    Family History  Problem Relation Age of Onset  . Heart attack Mother   . Emphysema Mother   . Congestive Heart Failure Mother   . Heart attack Father   . Congestive Heart Failure Father   . Asthma Other   . Lung disease Sister   . Lung disease Brother   . Colon cancer Neg Hx      ROS:  Please see the history of present illness.  All other ROS reviewed and negative.     Physical Exam/Data:   Vitals:   11/07/17 0811 11/07/17 0815 11/07/17 1000 11/07/17 1149  BP: 126/89  109/65   Pulse: (!) 58 64 61   Resp: 15 (!) 24 20   Temp:      TempSrc:      SpO2: 98% 98% 93% 100%  Weight:      Height:       No intake or output data in the 24 hours ending 11/07/17 1334 Filed Weights   11/06/17 2155  Weight: 134 lb (60.8 kg)   Body mass index is 23 kg/m.  General:  Well nourished, well developed, in no  acute distress HEENT: normal Lymph: no adenopathy Neck: no JVD Endocrine:  No thryomegaly Vascular: No carotid bruits; FA pulses 2+ bilaterally without bruits  Cardiac:  normal S1, S2; RRR; no murmur. Substernal chest pressure reproducible with palpations Lungs:  clear to auscultation bilaterally, no wheezing, rhonchi or rales  Abd: soft, nontender, no hepatomegaly  Ext: no edema Musculoskeletal:  No deformities, BUE and BLE strength normal and equal Skin: warm and dry  Neuro:  CNs 2-12 intact, no focal abnormalities noted Psych:  Normal affect   EKG:  The EKG was personally reviewed and demonstrates:  NSR at rate of 73 bpm  Telemetry:  Telemetry was personally reviewed and demonstrates:  Normal sinus rhythm   Relevant CV Studies: As above  Laboratory Data:  Chemistry Recent Labs  Lab 11/06/17 2214  NA 140  K 3.9  CL 104  CO2 25  GLUCOSE 128*  BUN 6*  CREATININE 0.81  CALCIUM 9.7  GFRNONAA >60  GFRAA >60  ANIONGAP 11    No results for input(s): PROT, ALBUMIN, AST, ALT, ALKPHOS, BILITOT in the last 168 hours. Hematology Recent Labs  Lab 11/06/17 2214  WBC 9.2  RBC 4.54  HGB 13.7  HCT 41.8  MCV 92.1  MCH 30.2  MCHC 32.8  RDW 13.1  PLT 191   Cardiac Enzymes Recent Labs  Lab 11/07/17 0531 11/07/17 0943  TROPONINI 0.07* 0.04*    Recent Labs  Lab 11/06/17 2215 11/07/17 0222  TROPIPOC 0.00 0.13*    BNPNo results for input(s): BNP, PROBNP in the last 168 hours.  DDimer No results for input(s): DDIMER in the last 168 hours.  Radiology/Studies:  Dg Chest 2 View  Result Date: 11/06/2017 CLINICAL DATA:  Acute onset of generalized chest pain and shortness of breath. Syncope. Ventricular tachycardia. Vomiting. EXAM: CHEST - 2 VIEW COMPARISON:  CT of the chest performed 05/31/2016, and chest radiograph performed 05/12/2015 FINDINGS: The lungs are well-aerated and clear. There is no evidence of focal opacification, pleural effusion or pneumothorax. The heart is  normal in size; the mediastinal contour is within normal limits. No acute osseous abnormalities are seen. External pacing pads are noted. IMPRESSION: No acute cardiopulmonary process seen. Electronically Signed   By: Garald Balding M.D.   On: 11/06/2017 22:38   Ct Angio Chest Aorta W And/or Wo Contrast  Result Date: 11/07/2017 CLINICAL DATA:  Chest pain and dyspnea EXAM: CT ANGIOGRAPHY CHEST WITH CONTRAST TECHNIQUE: Multidetector CT imaging of the chest was performed using the standard protocol during bolus administration of intravenous contrast. Multiplanar CT image reconstructions and MIPs were obtained to evaluate the vascular anatomy. CONTRAST:  127mL ISOVUE-370 IOPAMIDOL (ISOVUE-370) INJECTION 76% COMPARISON:  05/31/2016 FINDINGS: Cardiovascular: Satisfactory opacification of the pulmonary arteries to the segmental level without pulmonary embolus. Ectatic, atherosclerotic ascending thoracic aorta to 3.8 cm. No dissection. Normal heart size with coronary arteriosclerosis at the proximal aspect of the LAD. No pericardial effusion. Patent great vessels with conventional branch pattern. Mediastinum/Nodes: No mediastinal nor hilar adenopathy. Patent midline trachea and mainstem bronchi. Esophagus is unremarkable. Lungs/Pleura: Centrilobular emphysema is noted bilaterally with scarring at the left lung apex. 6.6 mm in average nodular subpleural densities seen in the right upper lobe. No pulmonary consolidation. Additional 5 mm nodule in the left lower lobe is also seen, series 6/135. Probable atelectasis posteriorly in the right lower lobe with stable atelectasis and/or scarring in the lingula. No effusion or pneumothorax. Upper Abdomen: No acute abnormality. Musculoskeletal: No chest wall abnormality. No acute or significant osseous findings. Review of the MIP images confirms the above findings. IMPRESSION: 1. Stable mild ectasia of the ascending thoracic aorta to 3.8 cm. No dissection or aneurysm. 2. No acute  pulmonary embolus. 3. Nodular densities in the right upper lobe and left lung base measuring 6.6 mm and 5 mm respectively. These are new since prior exam. Non-contrast chest CT at 3-6 months is recommended. If the nodules are stable at time of repeat CT,  then future CT at 18-24 months (from today's scan) is considered optional for low-risk patients, but is recommended for high-risk patients. This recommendation follows the consensus statement: Guidelines for Management of Incidental Pulmonary Nodules Detected on CT Images: From the Fleischner Society 2017; Radiology 2017; 284:228-243. 4. Coronary arteriosclerosis. 5. Centrilobular emphysema is redemonstrated. Aortic Atherosclerosis (ICD10-I70.0). Electronically Signed   By: Ashley Royalty M.D.   On: 11/07/2017 01:50    Assessment and Plan:   1. Chest pain/NSTEMI -No prior episode.  Questionable GI in etiology versus musculoskeletal as her pain is reproducible with palpation.  Troponin trending down as above.  EKG without acute ischemic changes.  Currently chest pain-free.  Only reproducible with palpation.  No arrhythmia noted on telemetry as well as EMS strip. -We will let her eat.  Prior exercise Myoview normal in March 2018.  CTA of chest without dissection or PE.  -Continue aspirin, statin and heparin. Given coronary calcification and mid elevated enzyme will plan cath later today. Keep NPO.   2.  Syncope -Due to vasovagal/hypotension episode. Records from EMS run sheet - PTA Alert L Sit 158/100 M 101 R 16 R 94 Rm 4 15=4+5+6/NQ 12 Time      BP          P     SPO2 21:17   148/92     78    95 21:22    60/35     59    92 21:26   130/98   102   93 21:36   127/77    81    94  Dr. Meda Coffee to see today.   For questions or updates, please contact [place] Please consult [email or web address] for contact info under Cardiology/STEMI.   [place] Longest [place], [place]  11/07/2017 1:34 PM   The patient was seen, examined and discussed with  Bhagat,Bhavinkumar PA-C and I agree with the above.   62 y.o. female with a known hx of coronary calcification, mildly dilated thoracic aortic aneurysm, COPD, hyperlipidemia, hypertension and anxiety, previously evaluated by me, normal stress test in 2018, now presenting with chest pain with some typical and some atypical features, however ruled in for a NSTEMI, we will plan for a left cardiac cath today. Chest CT ruled out pulmonary embolism and aortic dissection, but shows new pulmonary nodules that will need to be followed. Crea is normal, no allergy to contrast. Continue rosuvastatin and aspirin, no room for ACEI or BB.  Ena Dawley, MD 11/07/2017

## 2017-11-08 ENCOUNTER — Encounter (HOSPITAL_COMMUNITY): Payer: Self-pay | Admitting: Internal Medicine

## 2017-11-08 ENCOUNTER — Other Ambulatory Visit: Payer: Self-pay

## 2017-11-08 DIAGNOSIS — I2510 Atherosclerotic heart disease of native coronary artery without angina pectoris: Secondary | ICD-10-CM | POA: Diagnosis not present

## 2017-11-08 DIAGNOSIS — R55 Syncope and collapse: Secondary | ICD-10-CM

## 2017-11-08 DIAGNOSIS — R079 Chest pain, unspecified: Secondary | ICD-10-CM | POA: Diagnosis not present

## 2017-11-08 DIAGNOSIS — E785 Hyperlipidemia, unspecified: Secondary | ICD-10-CM | POA: Diagnosis not present

## 2017-11-08 DIAGNOSIS — I249 Acute ischemic heart disease, unspecified: Secondary | ICD-10-CM | POA: Diagnosis not present

## 2017-11-08 DIAGNOSIS — E782 Mixed hyperlipidemia: Secondary | ICD-10-CM | POA: Diagnosis not present

## 2017-11-08 LAB — CBC
HEMATOCRIT: 38.5 % (ref 36.0–46.0)
Hemoglobin: 12.7 g/dL (ref 12.0–15.0)
MCH: 30.3 pg (ref 26.0–34.0)
MCHC: 33 g/dL (ref 30.0–36.0)
MCV: 91.9 fL (ref 78.0–100.0)
Platelets: 157 10*3/uL (ref 150–400)
RBC: 4.19 MIL/uL (ref 3.87–5.11)
RDW: 13 % (ref 11.5–15.5)
WBC: 7.8 10*3/uL (ref 4.0–10.5)

## 2017-11-08 LAB — BASIC METABOLIC PANEL
Anion gap: 9 (ref 5–15)
BUN: 5 mg/dL — AB (ref 8–23)
CALCIUM: 8.4 mg/dL — AB (ref 8.9–10.3)
CO2: 25 mmol/L (ref 22–32)
CREATININE: 0.69 mg/dL (ref 0.44–1.00)
Chloride: 106 mmol/L (ref 98–111)
GFR calc Af Amer: >60 mL/min (ref >60)
GLUCOSE: 101 mg/dL — AB (ref 70–99)
Potassium: 3.8 mmol/L (ref 3.5–5.1)
Sodium: 140 mmol/L (ref 135–145)

## 2017-11-08 MED ORDER — ROSUVASTATIN CALCIUM 10 MG PO TABS: 10.0000 mg | ORAL_TABLET | Freq: Every day | ORAL | Status: DC

## 2017-11-08 MED ORDER — ROSUVASTATIN CALCIUM 10 MG PO TABS: 10.0000 mg | ORAL_TABLET | Freq: Every day | ORAL | 0 refills | Status: DC

## 2017-11-08 NOTE — Progress Notes (Addendum)
Progress Note  Patient Name: Katrina Campbell Date of Encounter: 11/08/2017  Primary Cardiologist: Ena Dawley, MD   Subjective   Patient reports feeling some chest congestion this morning which she attributes to missing her inhalers yesterday. Denies chest pain, SOB, or palpitations. Some soreness noted at right radial cath site.  Inpatient Medications    Scheduled Meds: . aspirin  81 mg Oral Daily  . buPROPion  300 mg Oral Daily  . enoxaparin (LOVENOX) injection  40 mg Subcutaneous Q24H  . fluticasone  1 spray Each Nare Daily  . magnesium oxide   Oral Daily  . mometasone-formoterol  2 puff Inhalation BID  . omega-3 acid ethyl esters  1 g Oral Daily  . rosuvastatin  10 mg Oral q1800  . sodium chloride flush  3 mL Intravenous Q12H  . umeclidinium bromide  1 puff Inhalation Daily   Continuous Infusions: . sodium chloride     PRN Meds: sodium chloride, acetaminophen, albuterol, guaiFENesin, hydrOXYzine, ondansetron (ZOFRAN) IV, sodium chloride flush   Vital Signs    Vitals:   11/07/17 1726 11/08/17 0017 11/08/17 0521 11/08/17 0552  BP: (!) 140/91 (!) 101/54 123/73   Pulse: 68 64 67   Resp: 14 16 16    Temp:  98.3 F (36.8 C) 98.8 F (37.1 C)   TempSrc:  Oral Oral   SpO2: 95% 93% 94%   Weight:    133 lb 11.2 oz (60.6 kg)  Height:        Intake/Output Summary (Last 24 hours) at 11/08/2017 1007 Last data filed at 11/08/2017 0918 Gross per 24 hour  Intake 730 ml  Output 800 ml  Net -70 ml   Filed Weights   11/06/17 2155 11/07/17 1531 11/08/17 0552  Weight: 134 lb (60.8 kg) 133 lb 3.2 oz (60.4 kg) 133 lb 11.2 oz (60.6 kg)    Telemetry    NSR - Personally Reviewed   Physical Exam   GEN: Laying in bed in no acute distress.   Neck: No JVD, no carotid bruits Cardiac: RRR, no murmurs, rubs, or gallops.  Respiratory: Clear to auscultation bilaterally, no wheezes/ rales/ rhonchi GI: NABS, Soft, nontender, non-distended  MS: No edema; No deformity. Neuro:   Nonfocal, moving all extremities spontaneously Psych: Normal affect   Labs    Chemistry Recent Labs  Lab 11/06/17 2214 11/08/17 0429  NA 140 140  K 3.9 3.8  CL 104 106  CO2 25 25  GLUCOSE 128* 101*  BUN 6* 5*  CREATININE 0.81 0.69  CALCIUM 9.7 8.4*  GFRNONAA >60 >60  GFRAA >60 >60  ANIONGAP 11 9     Hematology Recent Labs  Lab 11/06/17 2214 11/08/17 0429  WBC 9.2 7.8  RBC 4.54 4.19  HGB 13.7 12.7  HCT 41.8 38.5  MCV 92.1 91.9  MCH 30.2 30.3  MCHC 32.8 33.0  RDW 13.1 13.0  PLT 191 157    Cardiac Enzymes Recent Labs  Lab 11/07/17 0531 11/07/17 0943 11/07/17 1541  TROPONINI 0.07* 0.04* 0.04*    Recent Labs  Lab 11/06/17 2215 11/07/17 0222  TROPIPOC 0.00 0.13*     BNPNo results for input(s): BNP, PROBNP in the last 168 hours.   DDimer No results for input(s): DDIMER in the last 168 hours.   Radiology    Dg Chest 2 View  Result Date: 11/06/2017 CLINICAL DATA:  Acute onset of generalized chest pain and shortness of breath. Syncope. Ventricular tachycardia. Vomiting. EXAM: CHEST - 2 VIEW COMPARISON:  CT of the  chest performed 05/31/2016, and chest radiograph performed 05/12/2015 FINDINGS: The lungs are well-aerated and clear. There is no evidence of focal opacification, pleural effusion or pneumothorax. The heart is normal in size; the mediastinal contour is within normal limits. No acute osseous abnormalities are seen. External pacing pads are noted. IMPRESSION: No acute cardiopulmonary process seen. Electronically Signed   By: Garald Balding M.D.   On: 11/06/2017 22:38   Ct Angio Chest Aorta W And/or Wo Contrast  Result Date: 11/07/2017 CLINICAL DATA:  Chest pain and dyspnea EXAM: CT ANGIOGRAPHY CHEST WITH CONTRAST TECHNIQUE: Multidetector CT imaging of the chest was performed using the standard protocol during bolus administration of intravenous contrast. Multiplanar CT image reconstructions and MIPs were obtained to evaluate the vascular anatomy. CONTRAST:   116mL ISOVUE-370 IOPAMIDOL (ISOVUE-370) INJECTION 76% COMPARISON:  05/31/2016 FINDINGS: Cardiovascular: Satisfactory opacification of the pulmonary arteries to the segmental level without pulmonary embolus. Ectatic, atherosclerotic ascending thoracic aorta to 3.8 cm. No dissection. Normal heart size with coronary arteriosclerosis at the proximal aspect of the LAD. No pericardial effusion. Patent great vessels with conventional branch pattern. Mediastinum/Nodes: No mediastinal nor hilar adenopathy. Patent midline trachea and mainstem bronchi. Esophagus is unremarkable. Lungs/Pleura: Centrilobular emphysema is noted bilaterally with scarring at the left lung apex. 6.6 mm in average nodular subpleural densities seen in the right upper lobe. No pulmonary consolidation. Additional 5 mm nodule in the left lower lobe is also seen, series 6/135. Probable atelectasis posteriorly in the right lower lobe with stable atelectasis and/or scarring in the lingula. No effusion or pneumothorax. Upper Abdomen: No acute abnormality. Musculoskeletal: No chest wall abnormality. No acute or significant osseous findings. Review of the MIP images confirms the above findings. IMPRESSION: 1. Stable mild ectasia of the ascending thoracic aorta to 3.8 cm. No dissection or aneurysm. 2. No acute pulmonary embolus. 3. Nodular densities in the right upper lobe and left lung base measuring 6.6 mm and 5 mm respectively. These are new since prior exam. Non-contrast chest CT at 3-6 months is recommended. If the nodules are stable at time of repeat CT, then future CT at 18-24 months (from today's scan) is considered optional for low-risk patients, but is recommended for high-risk patients. This recommendation follows the consensus statement: Guidelines for Management of Incidental Pulmonary Nodules Detected on CT Images: From the Fleischner Society 2017; Radiology 2017; 284:228-243. 4. Coronary arteriosclerosis. 5. Centrilobular emphysema is  redemonstrated. Aortic Atherosclerosis (ICD10-I70.0). Electronically Signed   By: Ashley Royalty M.D.   On: 11/07/2017 01:50    Cardiac Studies   LHC 11/07/17: Conclusions: 1. Eccentric calcification with mild stenosis of the ostial LAD (~20%).  Otherwise, no angiographically significant coronary artery disease. 2. Normal left ventricular systolic function and filling pressure.  Recommendations: 1. Continue medical therapy and risk factor modification to prevent progression of CAD. 2. Monitor overnight to ensure the patient does not have recurrent chest pain.  Anticipate discharge home tomorrow morning.  Recommend Aspirin 81mg  daily for mild CAD and thoracic aortic aneurysm.    Patient Profile     62 y.o. female with PMH of coronary calcifications, HTN, HLD, mildly dilated thoracic aortic aneurysm, COPD, and anxiety who presented with CP and syncope after nitro administration.   Assessment & Plan    1. Chest pain: patient with a history of coronary arteriosclerosis on prior CT chest. She presented with chest pain and had mild trop elevations to 0.07. She underwent LHC yesterday to further evaluate and was found to have mild non-obstructive CAD with 20%  stenosis of her LAD and normal LV function. She was recommended for continue medical therapy and risk factor modification.  - Continue ASA and statin  2. HTN: BP stable not on antihypertensive therapy. She reports taking her BP daily at home and keeping a log with SBP typically in the 110s and DBP typically in the 60s.  - Could consider adding low dose ACEi/ARB vs. BBlocker outpatient if BP log consistently greater than 120/80  3. HLD: LDL 107 10/03/17. Patient reports some dietary indiscretion recently.  - Will increase home crestor to 10mg  daily - has been taking TIW.  4. Ascending thoracic aortic aneurysm: stable on CTA chest this admission - Continue routine monitoring  5. Pulmonary nodules: noted on CT chest this admission. Will  need outpatient follow-up - Continue management per primary team  Outpatient follow-up has been arranged.   For questions or updates, please contact [place] Please consult [email or web address] for contact info under Cardiology/STEMI.     Signed, Abigail Butts, PA-C  11/08/2017, 10:07 AM   [phone]  The patient was seen, examined and discussed with Abigail Butts, PA-C  and I agree with the above.   62 y.o. female with a known hx of coronary calcification, mildly dilated thoracic aortic aneurysm, COPD, hyperlipidemia, hypertension and anxiety, previously evaluated by me, normal stress test in 2018, now presenting with chest pain with some typical and some atypical features, however ruled in for a NSTEMI, we will plan for a left cardiac cath today. Chest CT ruled out pulmonary embolism and aortic dissection, but shows new pulmonary nodules that will need to be followed. Crea is normal, s/t cath yesterday with only non-obstructive CAD, she can be discharged, we will arrange an outpatient follow up. Continue rosuvastatin and aspirin, no room for ACEI or BB. Vitals are stable.  Ena Dawley, MD 11/08/2017

## 2017-11-08 NOTE — Discharge Summary (Signed)
Physician Discharge Summary  Katrina Campbell IWP:[id number] DOB: Apr 11, 1955  PCP: Alycia Rossetti, MD  Admit date: 11/06/2017 Discharge date: 11/08/2017  Recommendations for Outpatient Follow-up:  1. Dr. Vic Blackbird, PCP in 1 week with repeat labs (BMP). Ferdinand, NP/Cardiology on 12/08/2017 at 8:30 AM. 3. Dr. Ena Dawley, Cardiology  Home Health: None Equipment/Devices: None  Discharge Condition: Improved and stable CODE STATUS: Full Diet recommendation: Heart healthy diet.  Discharge Diagnoses:  Active Problems:   Chest pain   Acute coronary syndrome [facility])   Brief Summary: 62 year old female with PMH of coronary calcification, mildly dilated thoracic aortic aneurysm, COPD (follows with Dr. Ty Hilts, Pulmonology), HLD, HTN, depression and anxiety presented to the ED due to chest pain.  She developed sudden onset of chest pain and dyspnea approximately 10 minutes after eating carrot.  She felt like something stuck with pressure-like sensation and dyspnea.  She called her PCP who advised EMS activation.  She received 324 mg of aspirin and sublingual NTG x1.  As per reports she apparently had about 30 seconds of syncope following NTG administration when her SBP dropped from 158-60.  Patient had abnormal CT of chest in February 2018 showing borderline to mild ascending aortic aneurysm, age advanced coronary artery atherosclerosis, advanced emphysema.  Up stress test had shown low risk study with excellent exercise capacity.  She apparently exercises every day on treadmill and walks on and off incline for about 20 to 30 minutes without any limitation.  She was admitted for further evaluation and cardiology was consulted.  Chest pain: Cardiology was consulted and assisted with evaluation.  She had history of coronary atherosclerosis on prior CT chest.  She presented with chest pain and had mild troponin elevation to 0.07.  Troponin showed flat trend.  She underwent LHC on 8/6 to  further evaluate and was found to have mild nonobstructive CAD with 20% stenosis of her LAD and normal LV function.  Cardiology recommends continued medical therapy, risk factor modification, they have seen her today and cleared her for discharge.  Continue aspirin and statin dose has been adjusted as below.  Syncope: Transient.  In the context of transient hypotension related to NTG.  No arrhythmias noted on monitor.  Currently asymptomatic.  Gradual increase in activity advised.  As discussed with Cardiology, no driving restrictions recommended.  COPD: Stable without clinical bronchospasm.  Essential hypertension: As per patient's report, she takes her blood pressure daily at home and maintains a log with SBP typically in the 110s and DBP typically in the 60s.  She developed transient hypotension in the ambulance on route to ED likely related to sublingual NTG.  Stable.  Hyperlipidemia: LDL 107 on 10/03/2017.  Cardiology have increased Crestor from 10 mg 3 times a week to 10 mg daily.  Ascending thoracic aortic aneurysm: Stable on CTA chest this admission.  Outpatient follow-up.  Pulmonary nodules: Incidentally noted on CT this admission.  Nodular densities noted in right upper lobe and left lung base measuring 6.6 mm and 5 mm respectively which are new since prior exam.  Currently not smoking.  Outpatient follow-up as per radiology recommendations (noncontrast CT chest at 3 to 6 months recommended.   Consultations:  Cardiology  Procedures:  [place] 8/6.   Discharge Instructions  Discharge Instructions    Call MD for:   Complete by:  As directed    Passing out or feeling like passing out.   Call MD for:  difficulty breathing, headache or visual disturbances   Complete  by:  As directed    Call MD for:  extreme fatigue   Complete by:  As directed    Call MD for:  persistant dizziness or light-headedness   Complete by:  As directed    Call MD for:  persistant nausea and vomiting    Complete by:  As directed    Call MD for:  severe uncontrolled pain   Complete by:  As directed    Diet - low sodium heart healthy   Complete by:  As directed    Increase activity slowly   Complete by:  As directed        Medication List    STOP taking these medications   predniSONE 10 MG tablet Commonly known as:  DELTASONE     TAKE these medications   ADVAIR DISKUS 250-50 MCG/DOSE Aepb Generic drug:  Fluticasone-Salmeterol USE 1 INHALATION TWICE A DAY   ADVIL 200 MG tablet Generic drug:  ibuprofen Take 400 mg by mouth every 8 (eight) hours as needed for fever.   albuterol 108 (90 Base) MCG/ACT inhaler Commonly known as:  PROAIR HFA Inhale 2 puffs into the lungs every 6 (six) hours as needed for wheezing or shortness of breath.   aspirin EC 81 MG tablet Take 81 mg by mouth daily.   buPROPion 300 MG 24 hr tablet Commonly known as:  WELLBUTRIN XL TAKE 1 TABLET DAILY   Calcium-Vitamin D 500-400 MG-UNIT Tabs 1 a day   cholecalciferol 1000 units tablet Commonly known as:  VITAMIN D Take 2,000 Units by mouth daily.   FISH OIL PO Take 500 mg by mouth daily.   fluticasone 50 MCG/ACT nasal spray Commonly known as:  FLONASE USE 1 SPRAY IN EACH NOSTRIL TWICE A DAY AS NEEDED FOR ALLERGIES OR RHINITIS   guaiFENesin 600 MG 12 hr tablet Commonly known as:  MUCINEX Take 1,200 mg by mouth 2 (two) times daily as needed for to loosen phlegm.   hydrOXYzine 10 MG tablet Commonly known as:  ATARAX/VISTARIL Take 1 tablet (10 mg total) by mouth every 6 (six) hours as needed for itching (itching, burning rash).   INCRUSE ELLIPTA 62.5 MCG/INH Aepb Generic drug:  umeclidinium bromide USE 1 INHALATION DAILY (DISCARD 6 WEEKS AFTER OPENING FOIL TRAY)   MAGNESIUM PO Take 1 tablet by mouth daily.   rosuvastatin 10 MG tablet Commonly known as:  CRESTOR Take 1 tablet (10 mg total) by mouth daily at 6 PM. What changed:  when to take this   vitamin C 500 MG tablet Commonly known  as:  ASCORBIC ACID Take 500 mg by mouth every morning.      Follow-up Information    Daune Perch, NP Follow up on 12/08/2017.   Specialty:  Nurse Practitioner Why:  Please arrive 15 minutes early for your 8:30am cardiology follow-up appointment Contact information: [address] [place] [address] [phone]        Alycia Rossetti, MD. Go on 11/15/2017.   Specialty:  Family Medicine Why:  To be seen with repeat labs (BMP).@2 :15pm Contact information: [address] [place] [address] [phone]        Dorothy Spark, MD.   Specialty:  Cardiology Contact information: [place] [address] [phone]          No Known Allergies    Procedures/Studies: Dg Chest 2 View  Result Date: 11/06/2017 CLINICAL DATA:  Acute onset of generalized chest pain and shortness  of breath. Syncope. Ventricular tachycardia. Vomiting. EXAM: CHEST - 2 VIEW COMPARISON:  CT of the chest performed 05/31/2016, and chest radiograph performed 05/12/2015 FINDINGS: The lungs are well-aerated and clear. There is no evidence of focal opacification, pleural effusion or pneumothorax. The heart is normal in size; the mediastinal contour is within normal limits. No acute osseous abnormalities are seen. External pacing pads are noted. IMPRESSION: No acute cardiopulmonary process seen. Electronically Signed   By: Garald Balding M.D.   On: 11/06/2017 22:38   Ct Angio Chest Aorta W And/or Wo Contrast  Result Date: 11/07/2017 CLINICAL DATA:  Chest pain and dyspnea EXAM: CT ANGIOGRAPHY CHEST WITH CONTRAST TECHNIQUE: Multidetector CT imaging of the chest was performed using the standard protocol during bolus administration of intravenous contrast. Multiplanar CT image reconstructions and MIPs were obtained to evaluate the vascular anatomy. CONTRAST:  152mL ISOVUE-370 IOPAMIDOL (ISOVUE-370) INJECTION 76% COMPARISON:  05/31/2016 FINDINGS: Cardiovascular:  Satisfactory opacification of the pulmonary arteries to the segmental level without pulmonary embolus. Ectatic, atherosclerotic ascending thoracic aorta to 3.8 cm. No dissection. Normal heart size with coronary arteriosclerosis at the proximal aspect of the LAD. No pericardial effusion. Patent great vessels with conventional branch pattern. Mediastinum/Nodes: No mediastinal nor hilar adenopathy. Patent midline trachea and mainstem bronchi. Esophagus is unremarkable. Lungs/Pleura: Centrilobular emphysema is noted bilaterally with scarring at the left lung apex. 6.6 mm in average nodular subpleural densities seen in the right upper lobe. No pulmonary consolidation. Additional 5 mm nodule in the left lower lobe is also seen, series 6/135. Probable atelectasis posteriorly in the right lower lobe with stable atelectasis and/or scarring in the lingula. No effusion or pneumothorax. Upper Abdomen: No acute abnormality. Musculoskeletal: No chest wall abnormality. No acute or significant osseous findings. Review of the MIP images confirms the above findings. IMPRESSION: 1. Stable mild ectasia of the ascending thoracic aorta to 3.8 cm. No dissection or aneurysm. 2. No acute pulmonary embolus. 3. Nodular densities in the right upper lobe and left lung base measuring 6.6 mm and 5 mm respectively. These are new since prior exam. Non-contrast chest CT at 3-6 months is recommended. If the nodules are stable at time of repeat CT, then future CT at 18-24 months (from today's scan) is considered optional for low-risk patients, but is recommended for high-risk patients. This recommendation follows the consensus statement: Guidelines for Management of Incidental Pulmonary Nodules Detected on CT Images: From the Fleischner Society 2017; Radiology 2017; 284:228-243. 4. Coronary arteriosclerosis. 5. Centrilobular emphysema is redemonstrated. Aortic Atherosclerosis (ICD10-I70.0). Electronically Signed   By: Ashley Royalty M.D.   On: 11/07/2017  01:50      Subjective: Patient denies any further chest pain or dyspnea.  Ambulated in the room without dizziness or lightheadedness.  Mild soreness in the right wrist at site of LHC without bleeding or swelling.  Patient lives with spouse and drives infrequently to doctor's visits.  Mild chest congestion but no productive cough.  No wheezing.  Discharge Exam:  Vitals:   11/08/17 0017 11/08/17 0521 11/08/17 0552 11/08/17 1145  BP: (!) 101/54 123/73  (!) 141/77  Pulse: 64 67  65  Resp: 16 16  20   Temp: 98.3 F (36.8 C) 98.8 F (37.1 C)  97.7 F (36.5 C)  TempSrc: Oral Oral  Oral  SpO2: 93% 94%  97%  Weight:   60.6 kg (133 lb 11.2 oz)   Height:        General: Pleasant middle-aged female, moderately built and nourished sitting up comfortably in bed.  Cardiovascular: S1 & S2 heard, RRR, S1/S2 +. No murmurs, rubs, gallops or clicks. No JVD or pedal edema.  Telemetry personally reviewed: Sinus rhythm. Respiratory: Clear to auscultation without wheezing, rhonchi or crackles. No increased work of breathing. Abdominal:  Non distended, non tender & soft. No organomegaly or masses appreciated. Normal bowel sounds heard. CNS: Alert and oriented. No focal deficits. Extremities: no edema, no cyanosis.  Right wrist/LHC site without acute findings.    The results of significant diagnostics from this hospitalization (including imaging, microbiology, ancillary and laboratory) are listed below for reference.     Microbiology: No results found for this or any previous visit (from the past 240 hour(s)).   Labs: CBC: Recent Labs  Lab 11/06/17 2214 11/08/17 0429  WBC 9.2 7.8  NEUTROABS 6.8  --   HGB 13.7 12.7  HCT 41.8 38.5  MCV 92.1 91.9  PLT 191 161   Basic Metabolic Panel: Recent Labs  Lab 11/06/17 2214 11/08/17 0429  NA 140 140  K 3.9 3.8  CL 104 106  CO2 25 25  GLUCOSE 128* 101*  BUN 6* 5*  CREATININE 0.81 0.69  CALCIUM 9.7 8.4*   Cardiac Enzymes: Recent Labs  Lab  11/07/17 0531 11/07/17 0943 11/07/17 1541  TROPONINI 0.07* 0.04* 0.04*      Time coordinating discharge: 25 minutes  SIGNED:  Vernell Leep, MD, FACP, [facility]. [facility] Pager [phone]  If 7PM-7AM, please contact night-coverage [email or web address] Password [email or web address] 11/08/2017, 12:11 PM

## 2017-11-08 NOTE — Discharge Instructions (Addendum)
Please get your medications reviewed and adjusted by your Primary MD. ° °Please request your Primary MD to go over all Hospital Tests and Procedure/Radiological results at the follow up, please get all Hospital records sent to your Prim MD by signing hospital release before you go home. ° °If you had Pneumonia of Lung problems at the Hospital: °Please get a 2 view Chest X ray done in 6-8 weeks after hospital discharge or sooner if instructed by your Primary MD. ° °If you have Congestive Heart Failure: °Please call your Cardiologist or Primary MD anytime you have any of the following symptoms:  °1) 3 pound weight gain in 24 hours or 5 pounds in 1 week  °2) shortness of breath, with or without a dry hacking cough  °3) swelling in the hands, feet or stomach  °4) if you have to sleep on extra pillows at night in order to breathe ° °Follow cardiac low salt diet and 1.5 lit/day fluid restriction. ° °If you have diabetes °Accuchecks 4 times/day, Once in AM empty stomach and then before each meal. °Log in all results and show them to your primary doctor at your next visit. °If any glucose reading is under 80 or above 300 call your primary MD immediately. ° °If you have Seizure/Convulsions/Epilepsy: °Please do not drive, operate heavy machinery, participate in activities at heights or participate in high speed sports until you have seen by Primary MD or a Neurologist and advised to do so again. ° °If you had Gastrointestinal Bleeding: °Please ask your Primary MD to check a complete blood count within one week of discharge or at your next visit. Your endoscopic/colonoscopic biopsies that are pending at the time of discharge, will also need to followed by your Primary MD. ° °Get Medicines reviewed and adjusted. °Please take all your medications with you for your next visit with your Primary MD ° °Please request your Primary MD to go over all hospital tests and procedure/radiological results at the follow up, please ask your  Primary MD to get all Hospital records sent to his/her office. ° °If you experience worsening of your admission symptoms, develop shortness of breath, life threatening emergency, suicidal or homicidal thoughts you must seek medical attention immediately by calling 911 or calling your MD immediately  if symptoms less severe. ° °You must read complete instructions/literature along with all the possible adverse reactions/side effects for all the Medicines you take and that have been prescribed to you. Take any new Medicines after you have completely understood and accpet all the possible adverse reactions/side effects.  ° °Do not drive or operate heavy machinery when taking Pain medications.  ° °Do not take more than prescribed Pain, Sleep and Anxiety Medications ° °Special Instructions: If you have smoked or chewed Tobacco  in the last 2 yrs please stop smoking, stop any regular Alcohol  and or any Recreational drug use. ° °Wear Seat belts while driving. ° °Please note °You were cared for by a hospitalist during your hospital stay. If you have any questions about your discharge medications or the care you received while you were in the hospital after you are discharged, you can call the unit and asked to speak with the hospitalist on call if the hospitalist that took care of you is not available. Once you are discharged, your primary care physician will handle any further medical issues. Please note that NO REFILLS for any discharge medications will be authorized once you are discharged, as it is imperative that you   return to your primary care physician (or establish a relationship with a primary care physician if you do not have one) for your aftercare needs so that they can reassess your need for medications and monitor your lab values.  You can reach the hospitalist office at phone [phone] or fax [phone]   If you do not have a primary care physician, you can call [phone] for a physician  referral.    Syncope Syncope is when you temporarily lose consciousness. Syncope may also be called fainting or passing out. It is caused by a sudden decrease in blood flow to the brain. Even though most causes of syncope are not dangerous, syncope can be a sign of a serious medical problem. Signs that you may be about to faint include:  Feeling dizzy or light-headed.  Feeling nauseous.  Seeing all white or all black in your field of vision.  Having cold, clammy skin.  If you fainted, get medical help right away.Call your local emergency services (911 in the [place] Do not drive yourself to the hospital. Follow these instructions at home: Pay attention to any changes in your symptoms. Take these actions to help with your condition:  Have someone stay with you until you feel stable.  Do not drive, use machinery, or play sports until your health care provider says it is okay.  Keep all follow-up visits as told by your health care provider. This is important.  If you start to feel like you might faint, lie down right away and raise (elevate) your feet above the level of your heart. Breathe deeply and steadily. Wait until all of the symptoms have passed.  Drink enough fluid to keep your urine clear or pale yellow.  If you are taking blood pressure or heart medicine, get up slowly and take several minutes to sit and then stand. This can reduce dizziness.  Take over-the-counter and prescription medicines only as told by your health care provider.  Get help right away if:  You have a severe headache.  You have unusual pain in your chest, abdomen, or back.  You are bleeding from your mouth or rectum, or you have black or tarry stool.  You have a very fast or irregular heartbeat (palpitations).  You have pain with breathing.  You faint once or repeatedly.  You have a seizure.  You are confused.  You have trouble walking.  You have severe weakness.  You have vision  problems. These symptoms may represent a serious problem that is an emergency. Do not wait to see if your symptoms will go away. Get medical help right away. Call your local emergency services (911 in the [place] Do not drive yourself to the hospital. This information is not intended to replace advice given to you by your health care provider. Make sure you discuss any questions you have with your health care provider. Document Released: 03/21/2005 Document Revised: 08/27/2015 Document Reviewed: 12/03/2014 Elsevier Interactive Patient Education  2018 [employer].    Nonspecific Chest Pain Chest pain can be caused by many different conditions. There is always a chance that your pain could be related to something serious, such as a heart attack or a blood clot in your lungs. Chest pain can also be caused by conditions that are not life-threatening. If you have chest pain, it is very important to follow up with your health care provider. What are the causes? Causes of this condition include:  Heartburn.  Pneumonia or bronchitis.  Anxiety or stress.  Inflammation around your heart (pericarditis) or lung (pleuritis or pleurisy).  A blood clot in your lung.  A collapsed lung (pneumothorax). This can develop suddenly on its own (spontaneous pneumothorax) or from trauma to the chest.  Shingles infection (varicella-zoster virus).  Heart attack.  Damage to the bones, muscles, and cartilage that make up your chest wall. This can include: ? Bruised bones due to injury. ? Strained muscles or cartilage due to frequent or repeated coughing or overwork. ? Fracture to one or more ribs. ? Sore cartilage due to inflammation (costochondritis).  What increases the risk? Risk factors for this condition may include:  Activities that increase your risk for trauma or injury to your chest.  Respiratory infections or conditions that cause frequent coughing.  Medical conditions or overeating that can  cause heartburn.  Heart disease or family history of heart disease.  Conditions or health behaviors that increase your risk of developing a blood clot.  Having had chicken pox (varicella zoster).  What are the signs or symptoms? Chest pain can feel like:  Burning or tingling on the surface of your chest or deep in your chest.  Crushing, pressure, aching, or squeezing pain.  Dull or sharp pain that is worse when you move, cough, or take a deep breath.  Pain that is also felt in your back, neck, shoulder, or arm, or pain that spreads to any of these areas.  Your chest pain may come and go, or it may stay constant. How is this diagnosed? Lab tests or other studies may be needed to find the cause of your pain. Your health care provider may have you take a test called an ECG (electrocardiogram). An ECG records your heartbeat patterns at the time the test is performed. You may also have other tests, such as:  Transthoracic echocardiogram (TTE). In this test, sound waves are used to create a picture of the heart structures and to look at how blood flows through your heart.  Transesophageal echocardiogram (TEE).This is a more advanced imaging test that takes images from inside your body. It allows your health care provider to see your heart in finer detail.  Cardiac monitoring. This allows your health care provider to monitor your heart rate and rhythm in real time.  Holter monitor. This is a portable device that records your heartbeat and can help to diagnose abnormal heartbeats. It allows your health care provider to track your heart activity for several days, if needed.  Stress tests. These can be done through exercise or by taking medicine that makes your heart beat more quickly.  Blood tests.  Other imaging tests.  How is this treated? Treatment depends on what is causing your chest pain. Treatment may include:  Medicines. These may include: ? Acid blockers for  heartburn. ? Anti-inflammatory medicine. ? Pain medicine for inflammatory conditions. ? Antibiotic medicine, if an infection is present. ? Medicines to dissolve blood clots. ? Medicines to treat coronary artery disease (CAD).  Supportive care for conditions that do not require medicines. This may include: ? Resting. ? Applying heat or cold packs to injured areas. ? Limiting activities until pain decreases.  Follow these instructions at home: Medicines  If you were prescribed an antibiotic, take it as told by your health care provider. Do not stop taking the antibiotic even if you start to feel better.  Take over-the-counter and prescription medicines only as told by your health care provider. Lifestyle  Do not use any products that contain nicotine or tobacco,  such as cigarettes and e-cigarettes. If you need help quitting, ask your health care provider.  Do not drink alcohol.  Make lifestyle changes as directed by your health care provider. These may include: ? Getting regular exercise. Ask your health care provider to suggest some activities that are safe for you. ? Eating a heart-healthy diet. A registered dietitian can help you to learn healthy eating options. ? Maintaining a healthy weight. ? Managing diabetes, if necessary. ? Reducing stress, such as with yoga or relaxation techniques. General instructions  Avoid any activities that bring on chest pain.  If heartburn is the cause for your chest pain, raise (elevate) the head of your bed about 6 inches (15 cm) by putting blocks under the legs. Sleeping with more pillows does not effectively relieve heartburn because it only changes the position of your head.  Keep all follow-up visits as told by your health care provider. This is important. This includes any further testing if your chest pain does not go away. Contact a health care provider if:  Your chest pain does not go away.  You have a rash with blisters on your  chest.  You have a fever.  You have chills. Get help right away if:  Your chest pain is worse.  You have a cough that gets worse, or you cough up blood.  You have severe pain in your abdomen.  You have severe weakness.  You faint.  You have sudden, unexplained chest discomfort.  You have sudden, unexplained discomfort in your arms, back, neck, or jaw.  You have shortness of breath at any time.  You suddenly start to sweat, or your skin gets clammy.  You feel nauseous or you vomit.  You suddenly feel light-headed or dizzy.  Your heart begins to beat quickly, or it feels like it is skipping beats. These symptoms may represent a serious problem that is an emergency. Do not wait to see if the symptoms will go away. Get medical help right away. Call your local emergency services (911 in the [place] Do not drive yourself to the hospital. This information is not intended to replace advice given to you by your health care provider. Make sure you discuss any questions you have with your health care provider. Document Released: 12/29/2004 Document Revised: 12/14/2015 Document Reviewed: 12/14/2015 Elsevier Interactive Patient Education  2017 [employer].

## 2017-11-08 NOTE — Plan of Care (Signed)
  Problem: Pain Managment: Goal: General experience of comfort will improve Outcome: Progressing   Problem: Coping: Goal: Level of anxiety will decrease Outcome: Progressing   

## 2017-11-13 ENCOUNTER — Telehealth: Payer: Self-pay | Admitting: Physician Assistant

## 2017-11-13 DIAGNOSIS — M7989 Other specified soft tissue disorders: Secondary | ICD-10-CM

## 2017-11-13 DIAGNOSIS — M25421 Effusion, right elbow: Secondary | ICD-10-CM

## 2017-11-13 DIAGNOSIS — L539 Erythematous condition, unspecified: Secondary | ICD-10-CM

## 2017-11-13 NOTE — Telephone Encounter (Signed)
New Message   Pt was released for hospital from having a heart cath and is having some symptoms with her right arm. Please call

## 2017-11-13 NOTE — Telephone Encounter (Signed)
Spoke with patient who states she is having throbbing in right arm from elbow to wrist s/p LHC on 8/6. She states pain relieved by Advil 400 mg twice daily. Reports swelling in one spot approximately 4 inches from injection site and extending 2 inches above that with warmth and mild redness over the weekend.  States she put ice on the site yesterday evening and thinks it is better today. States soreness improving and denies discharge from injection site. I advised her to elevate her RUE above the level of her heart while sitting and to continue to monitor today for s/s of infection. She states that swelling and discoloration have improved since last Tuesday. I advised her to call back if redness or warmth returns today or with other questions or concerns. She verbalized understanding and agreement and thanked me for the call.

## 2017-11-13 NOTE — Telephone Encounter (Signed)
Please schedule her for a upper extremity arterial US ASAP

## 2017-11-14 ENCOUNTER — Ambulatory Visit (HOSPITAL_COMMUNITY)
Admission: RE | Admit: 2017-11-14 | Discharge: 2017-11-14 | Disposition: A | Discharge status: Home / Self Care | Source: Ambulatory Visit | Attending: Cardiology | Admitting: Cardiology

## 2017-11-14 DIAGNOSIS — M7989 Other specified soft tissue disorders: Secondary | ICD-10-CM | POA: Insufficient documentation

## 2017-11-14 DIAGNOSIS — L539 Erythematous condition, unspecified: Secondary | ICD-10-CM | POA: Diagnosis not present

## 2017-11-14 DIAGNOSIS — M25421 Effusion, right elbow: Secondary | ICD-10-CM | POA: Insufficient documentation

## 2017-11-14 NOTE — Telephone Encounter (Signed)
Spoke with the pt and informed her that Dr Meda Coffee recommends that we order for her to have a upper extremity arterial US done ASAP. Informed the pt that I will send a message to our [facility] schedulers to call her back ASAP to have this study arranged.  Pt verbalized understanding and agrees with this plan.

## 2017-11-14 NOTE — Telephone Encounter (Signed)
Pts upper extremity arterial US is scheduled for today at 2:30 pm.  Pt made aware of appt date and time by [facility] scheduler.

## 2017-11-15 ENCOUNTER — Inpatient Hospital Stay: Admitting: Family Medicine

## 2017-11-21 ENCOUNTER — Ambulatory Visit (INDEPENDENT_AMBULATORY_CARE_PROVIDER_SITE_OTHER): Admitting: Family Medicine

## 2017-11-21 ENCOUNTER — Encounter: Payer: Self-pay | Admitting: Family Medicine

## 2017-11-21 ENCOUNTER — Other Ambulatory Visit: Payer: Self-pay

## 2017-11-21 VITALS — BP 130/64 | HR 68 | Temp 98.1°F | Resp 12 | Ht 64.0 in | Wt 135.0 lb

## 2017-11-21 DIAGNOSIS — R2231 Localized swelling, mass and lump, right upper limb: Secondary | ICD-10-CM | POA: Diagnosis not present

## 2017-11-21 DIAGNOSIS — J432 Centrilobular emphysema: Secondary | ICD-10-CM | POA: Diagnosis not present

## 2017-11-21 DIAGNOSIS — I2510 Atherosclerotic heart disease of native coronary artery without angina pectoris: Secondary | ICD-10-CM | POA: Diagnosis not present

## 2017-11-21 NOTE — Patient Instructions (Addendum)
Ultrasound to be done  We will call with lab results F/U 4-5 months for Physical

## 2017-11-21 NOTE — Assessment & Plan Note (Signed)
Coronary artery disease seen on her catheterization continue the aspirin and Crestor daily follow-up with cardiology  Recheck her renal function as she did have diet with her catheterization make sure this is still intact.  The mass is concerning for possible either deep subcutaneous hematoma or nodule or possible DVT will obtain an ultrasound of the upper extremity

## 2017-11-21 NOTE — Progress Notes (Signed)
Subjective:    Patient ID: Katrina Campbell, female    DOB: Sep 04, 1955, 62 y.o.   MRN: [id number]  Patient presents for Hospital F/U (x2 weeks- S/P elevated troponins, has had heart cath) Here for hospital follow-up.  She was admitted to the hospital after having episode of chest discomfort after eating a carrot.  When EMS arrived she has had a syncopal event.  She had mild elevated troponins therefore cardiac catheterization was performed through her right arm.  She had 20% nonobstructive coronary artery disease therefore medical management was decided.  She was already on statin drug but taken 3 times a week this was increased to daily.  Her blood pressures are mildly elevated but then returned back to normal.  She opted to follow them and so starting a beta-blocker and she has follow-up with cardiology in a couple of weeks.  She has not had any further chest pain shortness of breath or syncopal episodes.  Thought that this may be angina with some coronary artery disease unclear the cause of the syncopal episode.  Review her medication she is taking everything as prescribed.  She is concerned about him not that is come up on her right upper arm.  Initially she has some swelling and pain in the lower part of her arm she has an arterial duplex done on the 14th but there was no sign of any fistula or blockage at that time but states that the ultrasonographer did not go above her elbow which is where she has does not that is tender.  No increase in size  Also had a mild cough since she left the hospital she does have some mild production but it is clear she has not had any wheezing or tightness like her typical COPD exacerbations.  She did start taking some Mucinex and that has helped.  Review Of Systems:  GEN- denies fatigue, fever, weight loss,weakness, recent illness HEENT- denies eye drainage, change in vision, nasal discharge, CVS- denies chest pain, palpitations RESP- denies SOB, cough, wheeze ABD-  denies N/V, change in stools, abd pain GU- denies dysuria, hematuria, dribbling, incontinence MSK- + joint pain, muscle aches, injury Neuro- denies headache, dizziness, syncope, seizure activity       Objective:    BP 130/64   Pulse 68   Temp 98.1 F (36.7 C) (Oral)   Resp 12   Ht 5\' 4"  (1.626 m)   Wt 135 lb (61.2 kg)   SpO2 96%   BMI 23.17 kg/m  GEN- NAD, alert and oriented x3 HEENT- PERRL, EOMI, non injected sclera, pink conjunctiva, MMM, oropharynx clear Neck- Supple, no thyromegaly CVS- RRR, no murmur RESP-CTAB ABD-NABS,soft,NT,ND EXT- No edema, RIght Upper ext- grape size mass lower portiom or right arm beneath subcutaneous tissue, TTP, no bruising above  Pulses- Radial, DP- 2+        Assessment & Plan:      Problem List Items Addressed This Visit      Unprioritized   CAD (coronary artery disease)    Coronary artery disease seen on her catheterization continue the aspirin and Crestor daily follow-up with cardiology  Recheck her renal function as she did have diet with her catheterization make sure this is still intact.  The mass is concerning for possible either deep subcutaneous hematoma or nodule or possible DVT will obtain an ultrasound of the upper extremity      Relevant Orders   Basic metabolic panel   COPD (chronic obstructive pulmonary disease) ([place])  Pulmonary exam is normal.  Not can change in of her medications hold on any prednisone or antibiotics at this time.  We will see how she does with the Mucinex first not required any use of albuterol       Other Visit Diagnoses    Arm mass, right    -  Primary   Concern for probable soft tissue mass/hematoma or DVT in upper arm. her vascular arterial study was of lower arm ,[place] to be done   Relevant Orders   VAS [place] UPPER EXTREMITY VENOUS DUPLEX   US Venous Img Upper Bilat      Note: This dictation was prepared with Dragon dictation along with smaller phrase technology. Any transcriptional errors  that result from this process are unintentional.

## 2017-11-21 NOTE — Assessment & Plan Note (Signed)
Pulmonary exam is normal.  Not can change in of her medications hold on any prednisone or antibiotics at this time.  We will see how she does with the Mucinex first not required any use of albuterol

## 2017-11-22 ENCOUNTER — Ambulatory Visit (HOSPITAL_COMMUNITY)
Admission: RE | Admit: 2017-11-22 | Discharge: 2017-11-22 | Disposition: A | Discharge status: Home / Self Care | Source: Ambulatory Visit | Attending: Family Medicine | Admitting: Family Medicine

## 2017-11-22 ENCOUNTER — Encounter (HOSPITAL_COMMUNITY)

## 2017-11-22 ENCOUNTER — Telehealth: Payer: Self-pay | Admitting: *Deleted

## 2017-11-22 DIAGNOSIS — R2231 Localized swelling, mass and lump, right upper limb: Secondary | ICD-10-CM | POA: Diagnosis present

## 2017-11-22 NOTE — Progress Notes (Signed)
*  Preliminary Results* Right upper extremity venous duplex completed. Right upper extremity is negative for deep and superficial vein thrombosis. Left message with Dr. Dorian Heckle assistant.    11/22/2017 9:36 AM Abram Sander

## 2017-11-22 NOTE — Telephone Encounter (Signed)
Received call from [facility] with the vascular lab.   Reports no DVT seen in R upper arm.

## 2017-11-27 ENCOUNTER — Encounter: Payer: Self-pay | Admitting: Family Medicine

## 2017-11-28 LAB — BASIC METABOLIC PANEL
BUN: 11 mg/dL (ref 7–25)
CO2: 27 mmol/L (ref 20–32)
Calcium: 9.3 mg/dL (ref 8.6–10.4)
Chloride: 104 mmol/L (ref 98–110)
Creat: 0.73 mg/dL (ref 0.50–0.99)
Glucose, Bld: 86 mg/dL (ref 65–99)
POTASSIUM: 4.4 mmol/L (ref 3.5–5.3)
SODIUM: 140 mmol/L (ref 135–146)

## 2017-12-08 ENCOUNTER — Encounter: Payer: Self-pay | Admitting: Cardiology

## 2017-12-08 ENCOUNTER — Ambulatory Visit (INDEPENDENT_AMBULATORY_CARE_PROVIDER_SITE_OTHER): Admitting: Cardiology

## 2017-12-08 VITALS — BP 146/82 | HR 69 | Ht 64.0 in | Wt 132.8 lb

## 2017-12-08 DIAGNOSIS — R911 Solitary pulmonary nodule: Secondary | ICD-10-CM

## 2017-12-08 DIAGNOSIS — E785 Hyperlipidemia, unspecified: Secondary | ICD-10-CM

## 2017-12-08 DIAGNOSIS — I2510 Atherosclerotic heart disease of native coronary artery without angina pectoris: Secondary | ICD-10-CM | POA: Diagnosis not present

## 2017-12-08 DIAGNOSIS — I712 Thoracic aortic aneurysm, without rupture, unspecified: Secondary | ICD-10-CM

## 2017-12-08 NOTE — Progress Notes (Signed)
Cardiology Office Note:    Date:  12/08/2017   ID:  Katrina Campbell, DOB 09-Jan-1956, MRN [id number]  PCP:  Katrina Rossetti, MD  Cardiologist:  Katrina Dawley, MD  Referring MD: Katrina Rossetti, MD   Chief Complaint  Patient presents with  . Hospitalization Follow-up    Post cath    History of Present Illness:    Katrina Campbell is a 62 y.o. female with a past medical history significant for coronary calcification and mildly dilated thoracic aortic aneurysm on CT.  Nuclear stress test, 06/2016,  with excellent functional capacity, no ischemia and normal LVEF.  She also has hyperlipidemia, hypertension, depression and COPD.  She was admitted to the hospital on 11/06/2017 with complaints of sudden onset of chest pain and dyspnea approximately 10 minutes after eating a carrot.  She was given nitroglycerin sublingually and apparently had about 30 seconds of syncope afterward.  Her systolic blood pressure dropped from 158 to 60.  She had a mild troponin elevation of 0.07 and a flat trend.  She underwent left heart cath on 11/07/2017 that showed mild nonobstructive CAD with 20% stenosis of her LAD and normal LV function.  She is being treated with risk factor modification, aspirin and statin.  Katrina Campbell is here today alone for hospital follow up. She had developed pain and swelling of her right arm above the cath site.  Ultrasound of the right arm was done showing no evidence of pseudoaneurysm or AV fistula.  Another study was done later to check for DVT which was negative and the patient was advised to use warm compress.  Her arm is better. She still has a very small residual knot in her upper medial arm. Right radial cath site is healed. BP was a little high in the hospital. She kept a record at home as advised. BP 110's-120's/70's-80's. She increased her crestor to every day. She is having trouble sleeping but no pain. She wonders if the Crestor is causing this.   She is back to exercising  On her  treadmill with incline of 6 for 20 minutes. No exertional chest discomfort or dyspnea. No lightheadedness, palpitations.    Past Medical History:  Diagnosis Date  . Anxiety   . COPD (chronic obstructive pulmonary disease) ([place])   . Depression   . Hyperlipidemia   . Labile hypertension     Past Surgical History:  Procedure Laterality Date  . LEFT HEART CATH AND CORONARY ANGIOGRAPHY N/A 11/07/2017   Procedure: LEFT HEART CATH AND CORONARY ANGIOGRAPHY;  Surgeon: Nelva Bush, MD;  Location: [facility];  Service: Cardiovascular;  Laterality: N/A;  . NASAL SINUS SURGERY  1986    Current Medications: Current Meds  Medication Sig  . ADVAIR DISKUS 250-50 MCG/DOSE AEPB USE 1 INHALATION TWICE A DAY  . albuterol (PROAIR HFA) 108 (90 BASE) MCG/ACT inhaler Inhale 2 puffs into the lungs every 6 (six) hours as needed for wheezing or shortness of breath.  Marland Kitchen aspirin EC 81 MG tablet Take 81 mg by mouth daily.  Marland Kitchen buPROPion (WELLBUTRIN XL) 300 MG 24 hr tablet TAKE 1 TABLET DAILY  . Calcium Carb-Cholecalciferol (CALCIUM-VITAMIN D) 500-400 MG-UNIT TABS 1 a day  . cholecalciferol (VITAMIN D) 1000 units tablet Take 2,000 Units by mouth daily.  . fluticasone (FLONASE) 50 MCG/ACT nasal spray USE 1 SPRAY IN EACH NOSTRIL TWICE A DAY AS NEEDED FOR ALLERGIES OR RHINITIS  . guaiFENesin (MUCINEX) 600 MG 12 hr tablet Take 1,200 mg by mouth 2 (two)  times daily as needed for to loosen phlegm.   Marland Kitchen ibuprofen (ADVIL) 200 MG tablet Take 400 mg by mouth every 8 (eight) hours as needed for fever.  . INCRUSE ELLIPTA 62.5 MCG/INH AEPB USE 1 INHALATION DAILY (DISCARD 6 WEEKS AFTER OPENING FOIL TRAY)  . MAGNESIUM PO Take 1 tablet by mouth daily.   . Omega-3 Fatty Acids (FISH OIL PO) Take 500 mg by mouth daily.  . rosuvastatin (CRESTOR) 10 MG tablet Take 1 tablet (10 mg total) by mouth daily at 6 PM.  . vitamin C (ASCORBIC ACID) 500 MG tablet Take 500 mg by mouth every morning.     Allergies:   Patient has no  known allergies.   Social History   Socioeconomic History  . Marital status: Married    Spouse name: Not on file  . Number of children: Not on file  . Years of education: Not on file  . Highest education level: Not on file  Occupational History  . Not on file  Social Needs  . Financial resource strain: Not on file  . Food insecurity:    Worry: Not on file    Inability: Not on file  . Transportation needs:    Medical: Not on file    Non-medical: Not on file  Tobacco Use  . Smoking status: Former Smoker    Packs/day: 1.00    Years: 40.00    Pack years: 40.00    Types: Cigarettes    Last attempt to quit: 11/09/2012    Years since quitting: 5.0  . Smokeless tobacco: Never Used  . Tobacco comment: vapor  Substance and Sexual Activity  . Alcohol use: Yes    Alcohol/week: 24.0 standard drinks    Types: 24 Cans of beer per week  . Drug use: No  . Sexual activity: Not Currently  Lifestyle  . Physical activity:    Days per week: Not on file    Minutes per session: Not on file  . Stress: Not on file  Relationships  . Social connections:    Talks on phone: Not on file    Gets together: Not on file    Attends religious service: Not on file    Active member of club or organization: Not on file    Attends meetings of clubs or organizations: Not on file    Relationship status: Not on file  Other Topics Concern  . Not on file  Social History Narrative  . Not on file     Family History: The patient's family history includes Asthma in her other; Congestive Heart Failure in her father and mother; Emphysema in her mother; Heart attack in her father and mother; Lung disease in her brother and sister. There is no history of Colon cancer. ROS:   Please see the history of present illness.     All other systems reviewed and are negative.  EKGs/Labs/Other Studies Reviewed:    The following studies were reviewed today:  LEFT HEART CATH AND CORONARY ANGIOGRAPHY 11/07/2017  Conclusion    Conclusions: 1. Eccentric calcification with mild stenosis of the ostial LAD (~20%).  Otherwise, no angiographically significant coronary artery disease. 2. Normal left ventricular systolic function and filling pressure.  Recommendations: 1. Continue medical therapy and risk factor modification to prevent progression of CAD. 2. Monitor overnight to ensure the patient does not have recurrent chest pain.  Anticipate discharge home tomorrow morning.  Recommend Aspirin 81mg  daily for mild CAD and thoracic aortic aneurysm.  CT 05/2016 IMPRESSION: 1. Resolution of previously described nodule along the right minor fissure. 2. Advanced emphysema, without acute superimposed process. 3. Age advanced coronary artery atherosclerosis. Recommend assessment of coronary risk factors and consideration of medical therapy. 4. Borderline to mild ascending aortic aneurysm, unchanged. Recommend annual imaging followup by CTA or MRA. This recommendation follows 2010 ACCF/AHA/AATS/ACR/ASA/SCA/SCAI/SIR/STS/SVM Guidelines for the Diagnosis and Management of Patients with Thoracic Aortic Disease. Circulation. 2010; 121: X381-W299   Nuclear stress test 06/2016 Study Highlights    Nuclear stress EF: 58%.  Blood pressure demonstrated a normal response to exercise.  There was no ST segment deviation noted during stress.  Defect 1: There is a small defect of moderate severity present in the apical anterior and apex location.  The study is normal.  This is a low risk study.  The left ventricular ejection fraction is normal (55-65%).  Normal stress nuclear study with breast attenuation but no ischemia; EF 58 with normal wall motion.     EKG:  EKG is not ordered today.    Recent Labs: 10/03/2017: ALT 39 11/08/2017: Hemoglobin 12.7; Platelets 157 11/21/2017: BUN 11; Creat 0.73; Potassium 4.4; Sodium 140   Recent Lipid Panel    Component Value Date/Time   CHOL 193 10/03/2017 0806    TRIG 87 10/03/2017 0806   HDL 69 10/03/2017 0806   CHOLHDL 2.8 10/03/2017 0806   CHOLHDL 2.6 12/28/2016 0906   VLDL 38 (H) 12/30/2015 1536   LDLCALC 107 (H) 10/03/2017 0806   LDLCALC 101 (H) 12/28/2016 0906    Physical Exam:    VS:  BP (!) 146/82   Pulse 69   Ht 5\' 4"  (1.626 m)   Wt 132 lb 12.8 oz (60.2 kg)   SpO2 96%   BMI 22.80 kg/m     Wt Readings from Last 3 Encounters:  12/08/17 132 lb 12.8 oz (60.2 kg)  11/21/17 135 lb (61.2 kg)  11/08/17 133 lb 11.2 oz (60.6 kg)     Physical Exam  Constitutional: She is oriented to person, place, and time. She appears well-developed and well-nourished. No distress.  HENT:  Head: Normocephalic and atraumatic.  Neck: Normal range of motion. Neck supple. No JVD present.  Cardiovascular: Normal rate, regular rhythm, normal heart sounds and intact distal pulses. Exam reveals no gallop and no friction rub.  No murmur heard. Pulmonary/Chest: Effort normal and breath sounds normal. No respiratory distress. She has no wheezes. She has no rales.  Abdominal: Soft. Bowel sounds are normal.  Musculoskeletal: Normal range of motion. She exhibits no edema or deformity.  Neurological: She is alert and oriented to person, place, and time.  Skin: Skin is warm and dry.  Psychiatric: She has a normal mood and affect. Her behavior is normal. Judgment and thought content normal.  Vitals reviewed.   ASSESSMENT:    1. Coronary artery disease involving native coronary artery of native heart without angina pectoris   2. Hyperlipidemia, unspecified hyperlipidemia type   3. Thoracic aortic aneurysm without rupture (HCC)   4. Pulmonary nodule    PLAN:    In order of problems listed above:  CAD: Mild nonobstructive CAD by cardiac catheterization on 11/07/2017.  Advised on risk factor modification, aspirin and statin. Recovered well, back to exercising.   Hyperlipidemia: LDL was 107 on 10/03/2017.  Her Crestor has been increased from 10 mg 3 times a week to  10 mg daily. Will follow up at her PCP.  Ascending thoracic aortic aneurysm: Stable on CTA 11/07/2017, 3.8 cm.  Continue routine monitoring.  Pulmonary nodules: Incidentally noted on CT this admission.  Non smoker. Will need outpatient follow-up. Her PCP, Dr. Lenna Gilford is following.    Medication Adjustments/Labs and Tests Ordered: Current medicines are reviewed at length with the patient today.  Concerns regarding medicines are outlined above. Labs and tests ordered and medication changes are outlined in the patient instructions below:  Patient Instructions  Medication Instructions: Your physician recommends that you continue on your current medications as directed. Please refer to the Current Medication list given to you today.   Labwork: None Ordered  Procedures/Testing: None Ordered  Follow-Up: Your physician wants you to follow-up in: 1 year with Dr.Nelson You will receive a reminder letter in the mail two months in advance. If you don't receive a letter, please call our office to schedule the follow-up appointment.   Any Additional Special Instructions Will Be Listed Below (If Applicable).   DASH Eating Plan DASH stands for "Dietary Approaches to Stop Hypertension." The DASH eating plan is a healthy eating plan that has been shown to reduce high blood pressure (hypertension). It may also reduce your risk for type 2 diabetes, heart disease, and stroke. The DASH eating plan may also help with weight loss. What are tips for following this plan? General guidelines  Avoid eating more than 2,300 mg (milligrams) of salt (sodium) a day. If you have hypertension, you may need to reduce your sodium intake to 1,500 mg a day.  Limit alcohol intake to no more than 1 drink a day for nonpregnant women and 2 drinks a day for men. One drink equals 12 oz of beer, 5 oz of wine, or 1 oz of hard liquor.  Work with your health care provider to maintain a healthy body weight or to lose weight. Ask what  an ideal weight is for you.  Get at least 30 minutes of exercise that causes your heart to beat faster (aerobic exercise) most days of the week. Activities may include walking, swimming, or biking.  Work with your health care provider or diet and nutrition specialist (dietitian) to adjust your eating plan to your individual calorie needs. Reading food labels  Check food labels for the amount of sodium per serving. Choose foods with less than 5 percent of the Daily Value of sodium. Generally, foods with less than 300 mg of sodium per serving fit into this eating plan.  To find whole grains, look for the word "whole" as the first word in the ingredient list. Shopping  Buy products labeled as "low-sodium" or "no salt added."  Buy fresh foods. Avoid canned foods and premade or frozen meals. Cooking  Avoid adding salt when cooking. Use salt-free seasonings or herbs instead of table salt or sea salt. Check with your health care provider or pharmacist before using salt substitutes.  Do not fry foods. Cook foods using healthy methods such as baking, boiling, grilling, and broiling instead.  Cook with heart-healthy oils, such as olive, canola, soybean, or sunflower oil. Meal planning   Eat a balanced diet that includes: ? 5 or more servings of fruits and vegetables each day. At each meal, try to fill half of your plate with fruits and vegetables. ? Up to 6-8 servings of whole grains each day. ? Less than 6 oz of lean meat, poultry, or fish each day. A 3-oz serving of meat is about the same size as a deck of cards. One egg equals 1 oz. ? 2 servings of low-fat dairy each day. ?  A serving of nuts, seeds, or beans 5 times each week. ? Heart-healthy fats. Healthy fats called Omega-3 fatty acids are found in foods such as flaxseeds and coldwater fish, like sardines, salmon, and mackerel.  Limit how much you eat of the following: ? Canned or prepackaged foods. ? Food that is high in trans fat,  such as fried foods. ? Food that is high in saturated fat, such as fatty meat. ? Sweets, desserts, sugary drinks, and other foods with added sugar. ? Full-fat dairy products.  Do not salt foods before eating.  Try to eat at least 2 vegetarian meals each week.  Eat more home-cooked food and less restaurant, buffet, and fast food.  When eating at a restaurant, ask that your food be prepared with less salt or no salt, if possible. What foods are recommended? The items listed may not be a complete list. Talk with your dietitian about what dietary choices are best for you. Grains Whole-grain or whole-wheat bread. Whole-grain or whole-wheat pasta. Brown rice. [place]. Bulgur. Whole-grain and low-sodium cereals. Pita bread. Low-fat, low-sodium crackers. Whole-wheat flour tortillas. Vegetables Fresh or frozen vegetables (raw, steamed, roasted, or grilled). Low-sodium or reduced-sodium tomato and vegetable juice. Low-sodium or reduced-sodium tomato sauce and tomato paste. Low-sodium or reduced-sodium canned vegetables. Fruits All fresh, dried, or frozen fruit. Canned fruit in natural juice (without added sugar). Meat and other protein foods Skinless chicken or [place]. Ground chicken or [place]. Pork with fat trimmed off. Fish and seafood. Egg whites. Dried beans, peas, or lentils. Unsalted nuts, nut butters, and seeds. Unsalted canned beans. Lean cuts of beef with fat trimmed off. Low-sodium, lean deli meat. Dairy Low-fat (1%) or fat-free (skim) milk. Fat-free, low-fat, or reduced-fat cheeses. Nonfat, low-sodium ricotta or cottage cheese. Low-fat or nonfat yogurt. Low-fat, low-sodium cheese. Fats and oils Soft margarine without trans fats. Vegetable oil. Low-fat, reduced-fat, or light mayonnaise and salad dressings (reduced-sodium). Canola, safflower, olive, soybean, and sunflower oils. Avocado. Seasoning and other foods Herbs. Spices. Seasoning mixes without salt. Unsalted popcorn and  pretzels. Fat-free sweets. What foods are not recommended? The items listed may not be a complete list. Talk with your dietitian about what dietary choices are best for you. Grains Baked goods made with fat, such as croissants, muffins, or some breads. Dry pasta or rice meal packs. Vegetables Creamed or fried vegetables. Vegetables in a cheese sauce. Regular canned vegetables (not low-sodium or reduced-sodium). Regular canned tomato sauce and paste (not low-sodium or reduced-sodium). Regular tomato and vegetable juice (not low-sodium or reduced-sodium). Angie Fava. Olives. Fruits Canned fruit in a light or heavy syrup. Fried fruit. Fruit in cream or butter sauce. Meat and other protein foods Fatty cuts of meat. Ribs. Fried meat. Berniece Salines. Sausage. Bologna and other processed lunch meats. Salami. Fatback. Hotdogs. Bratwurst. Salted nuts and seeds. Canned beans with added salt. Canned or smoked fish. Whole eggs or egg yolks. Chicken or [place] with skin. Dairy Whole or 2% milk, cream, and half-and-half. Whole or full-fat cream cheese. Whole-fat or sweetened yogurt. Full-fat cheese. Nondairy creamers. Whipped toppings. Processed cheese and cheese spreads. Fats and oils Butter. Stick margarine. Lard. Shortening. Ghee. Bacon fat. Tropical oils, such as coconut, palm kernel, or palm oil. Seasoning and other foods Salted popcorn and pretzels. Onion salt, garlic salt, seasoned salt, table salt, and sea salt. Worcestershire sauce. Tartar sauce. Barbecue sauce. Teriyaki sauce. Soy sauce, including reduced-sodium. Steak sauce. Canned and packaged gravies. Fish sauce. Oyster sauce. Cocktail sauce. Horseradish that you find on the shelf. Ketchup.  Mustard. Meat flavorings and tenderizers. Bouillon cubes. Hot sauce and Tabasco sauce. Premade or packaged marinades. Premade or packaged taco seasonings. Relishes. Regular salad dressings. Where to find more information:  National Heart, Lung, and [place]:  https://wilson-eaton.com/  American Heart Association: www.heart.org Summary  The DASH eating plan is a healthy eating plan that has been shown to reduce high blood pressure (hypertension). It may also reduce your risk for type 2 diabetes, heart disease, and stroke.  With the DASH eating plan, you should limit salt (sodium) intake to 2,300 mg a day. If you have hypertension, you may need to reduce your sodium intake to 1,500 mg a day.  When on the DASH eating plan, aim to eat more fresh fruits and vegetables, whole grains, lean proteins, low-fat dairy, and heart-healthy fats.  Work with your health care provider or diet and nutrition specialist (dietitian) to adjust your eating plan to your individual calorie needs. This information is not intended to replace advice given to you by your health care provider. Make sure you discuss any questions you have with your health care provider. Document Released: 03/10/2011 Document Revised: 03/14/2016 Document Reviewed: 03/14/2016 Elsevier Interactive Patient Education  [employer].    If you need a refill on your cardiac medications before your next appointment, please call your pharmacy.      Signed, Daune Perch, NP  12/08/2017 [place]

## 2017-12-08 NOTE — Patient Instructions (Addendum)
Medication Instructions: Your physician recommends that you continue on your current medications as directed. Please refer to the Current Medication list given to you today.   Labwork: None Ordered  Procedures/Testing: None Ordered  Follow-Up: Your physician wants you to follow-up in: 1 year with Dr.Nelson You will receive a reminder letter in the mail two months in advance. If you don't receive a letter, please call our office to schedule the follow-up appointment.   Any Additional Special Instructions Will Be Listed Below (If Applicable).   DASH Eating Plan DASH stands for "Dietary Approaches to Stop Hypertension." The DASH eating plan is a healthy eating plan that has been shown to reduce high blood pressure (hypertension). It may also reduce your risk for type 2 diabetes, heart disease, and stroke. The DASH eating plan may also help with weight loss. What are tips for following this plan? General guidelines  Avoid eating more than 2,300 mg (milligrams) of salt (sodium) a day. If you have hypertension, you may need to reduce your sodium intake to 1,500 mg a day.  Limit alcohol intake to no more than 1 drink a day for nonpregnant women and 2 drinks a day for men. One drink equals 12 oz of beer, 5 oz of wine, or 1 oz of hard liquor.  Work with your health care provider to maintain a healthy body weight or to lose weight. Ask what an ideal weight is for you.  Get at least 30 minutes of exercise that causes your heart to beat faster (aerobic exercise) most days of the week. Activities may include walking, swimming, or biking.  Work with your health care provider or diet and nutrition specialist (dietitian) to adjust your eating plan to your individual calorie needs. Reading food labels  Check food labels for the amount of sodium per serving. Choose foods with less than 5 percent of the Daily Value of sodium. Generally, foods with less than 300 mg of sodium per serving fit into this  eating plan.  To find whole grains, look for the word "whole" as the first word in the ingredient list. Shopping  Buy products labeled as "low-sodium" or "no salt added."  Buy fresh foods. Avoid canned foods and premade or frozen meals. Cooking  Avoid adding salt when cooking. Use salt-free seasonings or herbs instead of table salt or sea salt. Check with your health care provider or pharmacist before using salt substitutes.  Do not fry foods. Cook foods using healthy methods such as baking, boiling, grilling, and broiling instead.  Cook with heart-healthy oils, such as olive, canola, soybean, or sunflower oil. Meal planning   Eat a balanced diet that includes: ? 5 or more servings of fruits and vegetables each day. At each meal, try to fill half of your plate with fruits and vegetables. ? Up to 6-8 servings of whole grains each day. ? Less than 6 oz of lean meat, poultry, or fish each day. A 3-oz serving of meat is about the same size as a deck of cards. One egg equals 1 oz. ? 2 servings of low-fat dairy each day. ? A serving of nuts, seeds, or beans 5 times each week. ? Heart-healthy fats. Healthy fats called Omega-3 fatty acids are found in foods such as flaxseeds and coldwater fish, like sardines, salmon, and mackerel.  Limit how much you eat of the following: ? Canned or prepackaged foods. ? Food that is high in trans fat, such as fried foods. ? Food that is high in saturated  fat, such as fatty meat. ? Sweets, desserts, sugary drinks, and other foods with added sugar. ? Full-fat dairy products.  Do not salt foods before eating.  Try to eat at least 2 vegetarian meals each week.  Eat more home-cooked food and less restaurant, buffet, and fast food.  When eating at a restaurant, ask that your food be prepared with less salt or no salt, if possible. What foods are recommended? The items listed may not be a complete list. Talk with your dietitian about what dietary choices  are best for you. Grains Whole-grain or whole-wheat bread. Whole-grain or whole-wheat pasta. Brown rice. [place]. Bulgur. Whole-grain and low-sodium cereals. Pita bread. Low-fat, low-sodium crackers. Whole-wheat flour tortillas. Vegetables Fresh or frozen vegetables (raw, steamed, roasted, or grilled). Low-sodium or reduced-sodium tomato and vegetable juice. Low-sodium or reduced-sodium tomato sauce and tomato paste. Low-sodium or reduced-sodium canned vegetables. Fruits All fresh, dried, or frozen fruit. Canned fruit in natural juice (without added sugar). Meat and other protein foods Skinless chicken or [place]. Ground chicken or [place]. Pork with fat trimmed off. Fish and seafood. Egg whites. Dried beans, peas, or lentils. Unsalted nuts, nut butters, and seeds. Unsalted canned beans. Lean cuts of beef with fat trimmed off. Low-sodium, lean deli meat. Dairy Low-fat (1%) or fat-free (skim) milk. Fat-free, low-fat, or reduced-fat cheeses. Nonfat, low-sodium ricotta or cottage cheese. Low-fat or nonfat yogurt. Low-fat, low-sodium cheese. Fats and oils Soft margarine without trans fats. Vegetable oil. Low-fat, reduced-fat, or light mayonnaise and salad dressings (reduced-sodium). Canola, safflower, olive, soybean, and sunflower oils. Avocado. Seasoning and other foods Herbs. Spices. Seasoning mixes without salt. Unsalted popcorn and pretzels. Fat-free sweets. What foods are not recommended? The items listed may not be a complete list. Talk with your dietitian about what dietary choices are best for you. Grains Baked goods made with fat, such as croissants, muffins, or some breads. Dry pasta or rice meal packs. Vegetables Creamed or fried vegetables. Vegetables in a cheese sauce. Regular canned vegetables (not low-sodium or reduced-sodium). Regular canned tomato sauce and paste (not low-sodium or reduced-sodium). Regular tomato and vegetable juice (not low-sodium or reduced-sodium). Angie Fava.  Olives. Fruits Canned fruit in a light or heavy syrup. Fried fruit. Fruit in cream or butter sauce. Meat and other protein foods Fatty cuts of meat. Ribs. Fried meat. Berniece Salines. Sausage. Bologna and other processed lunch meats. Salami. Fatback. Hotdogs. Bratwurst. Salted nuts and seeds. Canned beans with added salt. Canned or smoked fish. Whole eggs or egg yolks. Chicken or [place] with skin. Dairy Whole or 2% milk, cream, and half-and-half. Whole or full-fat cream cheese. Whole-fat or sweetened yogurt. Full-fat cheese. Nondairy creamers. Whipped toppings. Processed cheese and cheese spreads. Fats and oils Butter. Stick margarine. Lard. Shortening. Ghee. Bacon fat. Tropical oils, such as coconut, palm kernel, or palm oil. Seasoning and other foods Salted popcorn and pretzels. Onion salt, garlic salt, seasoned salt, table salt, and sea salt. Worcestershire sauce. Tartar sauce. Barbecue sauce. Teriyaki sauce. Soy sauce, including reduced-sodium. Steak sauce. Canned and packaged gravies. Fish sauce. Oyster sauce. Cocktail sauce. Horseradish that you find on the shelf. Ketchup. Mustard. Meat flavorings and tenderizers. Bouillon cubes. Hot sauce and Tabasco sauce. Premade or packaged marinades. Premade or packaged taco seasonings. Relishes. Regular salad dressings. Where to find more information:  National Heart, Lung, and [place]: https://wilson-eaton.com/  American Heart Association: www.heart.org Summary  The DASH eating plan is a healthy eating plan that has been shown to reduce high blood pressure (hypertension). It may also reduce  your risk for type 2 diabetes, heart disease, and stroke.  With the DASH eating plan, you should limit salt (sodium) intake to 2,300 mg a day. If you have hypertension, you may need to reduce your sodium intake to 1,500 mg a day.  When on the DASH eating plan, aim to eat more fresh fruits and vegetables, whole grains, lean proteins, low-fat dairy, and heart-healthy  fats.  Work with your health care provider or diet and nutrition specialist (dietitian) to adjust your eating plan to your individual calorie needs. This information is not intended to replace advice given to you by your health care provider. Make sure you discuss any questions you have with your health care provider. Document Released: 03/10/2011 Document Revised: 03/14/2016 Document Reviewed: 03/14/2016 Elsevier Interactive Patient Education  [employer].    If you need a refill on your cardiac medications before your next appointment, please call your pharmacy.

## 2018-01-07 ENCOUNTER — Other Ambulatory Visit: Payer: Self-pay | Admitting: Pulmonary Disease

## 2018-01-24 ENCOUNTER — Telehealth: Payer: Self-pay | Admitting: Pulmonary Disease

## 2018-01-24 NOTE — Telephone Encounter (Signed)
Called and spoke with patient she stated that she was seen in the hospital on 8/5 and got out on 8/8. When patient was released she was told that she needed to follow up with SN in regards to the new nodule that was found. Patient has been scheduled to follow up with SN. Nothing further needed.

## 2018-01-28 ENCOUNTER — Other Ambulatory Visit: Payer: Self-pay | Admitting: Family Medicine

## 2018-02-06 ENCOUNTER — Ambulatory Visit (INDEPENDENT_AMBULATORY_CARE_PROVIDER_SITE_OTHER): Admitting: Pulmonary Disease

## 2018-02-06 ENCOUNTER — Encounter: Payer: Self-pay | Admitting: Pulmonary Disease

## 2018-02-06 VITALS — BP 142/80 | HR 73 | Temp 98.3°F | Ht 64.0 in | Wt 133.2 lb

## 2018-02-06 DIAGNOSIS — F341 Dysthymic disorder: Secondary | ICD-10-CM | POA: Diagnosis not present

## 2018-02-06 DIAGNOSIS — J432 Centrilobular emphysema: Secondary | ICD-10-CM | POA: Diagnosis not present

## 2018-02-06 DIAGNOSIS — R918 Other nonspecific abnormal finding of lung field: Secondary | ICD-10-CM | POA: Diagnosis not present

## 2018-02-06 DIAGNOSIS — R9389 Abnormal findings on diagnostic imaging of other specified body structures: Secondary | ICD-10-CM | POA: Diagnosis not present

## 2018-02-06 MED ORDER — FLUTICASONE-SALMETEROL 250-50 MCG/DOSE IN AEPB: INHALATION_SPRAY | RESPIRATORY_TRACT | 2 refills | Status: DC

## 2018-02-06 NOTE — Patient Instructions (Signed)
Today we updated your med list in our EPIC system...    Continue your current medications the same...  We discussed repeating your CT Chest at this time...    We will contact you w/ the results when available...   Call for any questions or if I can be of service in any way...  After we see the results we will arrange for further follow up with out pulmonary team..Marland Kitchen

## 2018-02-06 NOTE — Progress Notes (Addendum)
Subjective:     Patient ID: Katrina Campbell, female   DOB: [birth year], 62 y.o.   MRN: [id number]  HPI 62 y/o WF, former smoker who quit in 2014, w/ COPD/Emphysema (GOLD Stage 2), Hx LUL pneumonia 10/16 that was slow to clear, f/u CT Chest 2/17 w/ resolution of LUL changes (scarring) but coronary & Aortic atherosclerotic calcif, mild centrilob & pqaraseptal emphysema, & a new 25mm nodule along minor fissure => needs f/u...  ~  February 10, 2015:  Initial pulmonary consult by SN>  26 y/o WF, referred by [name] & [name] for an abn CXR>      Katrina Campbell is an ex-smoker, having started in her teens and smoked for ~45 yrs up to 2ppd, for an est 50 pack-yr smoking hx (she quit in 2014 because her fingers were purple);  She carries the Dx of underlying COPD & was placed on Advair250, also tried Incruse x 77mo but she stopped this med;  She describes a developing cough, prod of a small amt of green phlegm (no blood), assoc w/ increased SOB and sharp left upper CP, and decr energy "I was past going";  She went to urgent care where a CXR showed a left upper lobe opac "I was treated for pneumonia w/ 2 shots & Levaquin x 10d";  She subseq saw [name] PA at [facility] office who ordered a CT Chest (see below).  Smoking Hx>  As above  Pulmonary Hx>  She has a hx of sinusitis w/ surg x2;  She has had occas bouts of bronchitis requiring antibiotics & pneumonia in 2015 treated w/ antibiotic & pred w/ resolution;  No hx asthma, no hx TB or known exposure;  She admits to mild cough, some chest congestion, but denied SOB/DOE before the last 2-3 yrs...  Medical Hx>  HBP (but not requiring meds now), HL (on diet alone), anxiety/ depression (on WellbutrinXL).Marland KitchenMarland Kitchen  Family Hx>  Father was a smoker w/ emphysema & died in his 44's w/ CHF; mother died at 8 w/ CHF; all 3 sibs have lung dis- 1Bro died 55'd w/ lung cancer, 1Sis died w/ pneumonia, 1Sis alive w/ COPD...  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  Current Meds>  Advair250, AlbuterolHFA, Mucinex, ASA81, MVIs, WellbutrinXL300... EXAM shows Afeb, VSS, O2sat=98% on RA at rest;  HEENT- neg, mallampati1;  Chest- decr BS at bases, w/o w/r/r;  Heart- RR w/o m/r/g;  Abd- soft, neg;  Ext- w/o c/c/e;  Neuro- intact...   CXR 07/19/12 showed norm heart size, COPD w/ hyperinflation, resolved prev lingular infiltrate- now clear/ NAD...   CT Chest 01/22/14 done at [facility] showed athersclerotic calcif in Ao & branch vessels including the coronaries, no adenopathy, COPD changes noted, no pulm nodules, min RML atx  LABS 9/16 in Epic>  Chems- wnl;  CBC- wnl;  Norm VitD, B12, & Iron;  TSH=3.82...  CXR 01/25/15 at [facility] showed norm heart size, underlying COPD w/ hyperinflation, 2.6cm density LUL medially- infiltrate vs mass...  CT Chest performed 01/27/15 [facility] showed left apical (medial/post) opacity assoc w/ centrilob emphysema changes & scattered areas of scarring  Spirometry 02/10/15 showed FVC=2.98 (97%), FEV1=1.59 (66%), %1sec=54%, mid-flows reduced at 33% predicted... c/w mod severe airflow obstruction & GOLD Stage2 COPD.  Ambulatory Oxygen saturation test on RA 02/10/15>  O2sat=97% w/ pulse=62/min;  She ambulated 3 laps w/ lowest O2sat=84% w/ pulse=102/min (SOB during a cough  paroxysm)...  IMP/PLAN>>  Katrina Campbell has mod COPD & quit smoking 2014;  She had a lingular pneumonia in 2015 which resolved to baseline after antibiotics and pred rx;  She presented w/ acute LUL symptoms c/w pneumonia and an abn CXR=> slow to resolve after antibiotic rx;  I told her that in my opinion we should give it a little longer to see if this represents XRay lag time- rec to take AUGMENTIN875Bid/Align, continue MUCINEX(2Bid)/ fluids, continue ADVAIR250Bid, and restart the INCRUSE daily;  We plan ROV in 3wks w/ CXR & to consider timing of a repeat CT, consider PET, etc....    ~  March 11, 2015:  86mo ROV w/ SN>  Katrina Campbell reports that she is breathing sl better- notes sl congestion, sl sore throat, sl cough/ beige phlegm, sl left scapular pain, no f/c/s=> we discussed DukesMMW for throat & continue meds + Mucinex 1200mg Bid w/ fluids...     COPD/ Emphysema> on Advair250Bid, Incruse daily, Mucinex 600- 1to2Bid    Hx LUL pneumonia w/ left apical opac on CT 01/2015 slow to clear>     Ex-smoker w/ 65+pack-yr hx>  She quit smoking in 2014...    Hx sinusitis w/ surg x2 in the past EXAM shows Afeb, VSS, O2sat=99% on RA at rest;  HEENT- neg, mallampati1;  Chest- decr BS at bases, sl congested cough, w/o w/r/r;  Heart- RR w/o m/r/g;  Abd- soft, neg;  Ext- w/o c/c/e;  Neuro- intact...   CXR 03/11/15 showed norm heart size, hyperinflation w/ interstitial coarsening, NAD...  IMP/PLAN>>  Katrina Campbell has Stage 2 COPD/ emphysema & prev LUL pneumonia w/ CT Chest 01/2015 showing left apical opacity (slow to clear); she has quit smoking (2014), takes Advair250Bid, Incruse daily, Mucinex, and we discussed need for incr exercise program... CXR today w/ COPD/emphysema & left apex looks ok- we decided to wait 67mo & recheck CT at that time...   ~  May 12, 2015:  62mo ROV & Katrina Campbell notes some chr congestion, clear sput, & DOE- she continues on Advbair250, Incruse, Proair, Mucinex, Fluids... It is time for her f/u CT Chest...    COPD/ Emphysema> on Advair250Bid, Incruse daily, Proair, Mucinex 600- 1to2Bid; Mod airflow obstruction w/ GOLD Stage2 COPD    Hx LUL pneumonia w/ left apical opac on CT 01/2015 slow to clear> also had lingular pneumonia 2015    Ex-smoker w/ 65+pack-yr hx>  She quit smoking in 2014...    Hx sinusitis w/ surg x2 in the past    Atherosclerotic calcif in Ao, branch vessels, and coronaries on prev CTs...  EXAM shows Afeb, VSS, O2sat=97% on RA at rest;  HEENT- neg, mallampati1;  Chest- decr BS at bases, sl congested cough, w/o w/r/r;  Heart- RR w/o m/r/g;  Abd- soft, neg;  Ext-  w/o c/c/e;  Neuro- intact  CXR 05/12/15 showed norm heart size, hyperinflation/ clear lungs/ NAD...   CT Chest 05/22/15 showed norm heart size, coronary calcif & atherosclerotic calcif of Ao, no signif adenopathy, mild centrilob & pqaraseptal emphysema, prev left apical opac is resolved w/ mild resid scarring, new 4mm nodule along minor fissure & radiology rec f/u CT in 59yr... IMP/PLAN>>  Left apical opac is resolved, new 53mm nodule in right mid-zone noted; for the cough she will continue the Mucinex and for the Dyspnea- advised again to start exercise program... We plan ROV in 70mo.  ~  November 09, 2015:  5mo ROV & Katrina Campbell has retired from [employer], enjoying retirement but not exercising 7 we discussed  the need for a regular exercise program;  She is concerned about 8# wt gain (her BMI=24) and this provides an ideal situation to encourage an exercise program- we discussed a Gym, the [employer], Yoga, etc... She had some left shoulder pain, saw an orthopedist w/ shot for frozen shoulder which helped some... Her breathing is stable- some DOE w/ exertion, notes occas wheezing, min cough, no sput, etc; she is regular w/ her ADVAIR-250 (2spBid) and Incruse (once daily) she is using Mucinex1200mg /d and AlbutHFA rescue inhaler as needed (once daily on ave); she thinks that her small dog may be part of her prob indoors and we reviewed having a safe room w/ HEPA filter air cleaner to creat a good environment for her to breathe...  EXAM shows Afeb, VSS, O2sat=98% on RA at rest;  HEENT- neg, mallampati1;  Chest- decr BS at bases, clear w/o w/r/r;  Heart- RR w/o m/r/g;  Abd- soft, neg;  Ext- w/o c/c/e;  Neuro- intact  We reviewed prev CXRs & CT Chest scan IMP/PLAN>>  Margart is reassured about her breathing; doing satis off cigarettes for 6yrs, taking her inhalers regularly; now needs to incr her exercise program & push the envelope for max improvement- briefly discussed Pulm Rehab but she wants to try it on her own & I  agree; we plan ROV recheck & f/u CT Chest in 66mo... In the meanwhile we reviewed adult vaccine schedule but she is reluctant, says she hasn't taken any vaccines, thinks her sisters vaccination gave her severe pneumonia a few yrs ago; I didn't push the issue but discussed the benefits from Pneumococcal vaccination 7 the flu shots; she will consider these options...   ~  May 19, 2016:  42mo ROV & Katrina Campbell shared the tragic news that her 97 y/o daughter (who lived w/ her) overdosed 2 wks ago & passed away (she had been in [place] x2 w/o help); Kenedi is understandably tearful, can't sleep, notes breathing is worse w/ incr use of her rescue inhaler (and little benefit)... We discussed trial of Klonopin for the dyspnea and anxiety, cautioned not to fall back on old habits and stay away from cigs...     COPD/ Emphysema> on Advair250Bid, Incruse daily, Mucinex 600- 1to2Bid; Proair rescue & Flonase prn; CT Chest showed centrilob & paraseptal emphysema and PFTs showed GOLD Stage 2 disease...    Hx LUL pneumonia w/ left apical opac on CT 01/2015 slow to clear> f/u CT Chest 2/17 showed resolution of LUL changes    107mm pulm nodule on CT Chest 05/2015> located along the minor fissure & f/u due 77yr => f/u CT Chest 05/2016 w/ resolution of the nodule    Ex-smoker w/ 65+pack-yr hx>  She quit smoking in 2014...    Hx sinusitis w/ surg x2 in the past    Cardiac Issues> on ASA81; coronary & aortic calcif seen on CT Chest; prev hx of HBP (not on meds now), HL (not on meds) => CT Chest 05/2016 w/ Asc Ao measure of 4cm, +cardiac risk factors therefore referred to Cardiology to establish.    Medical issues> PCP is DrMcKeown on WellbutrinXL300 to help depression EXAM shows Afeb, VSS, O2sat=97% on RA at rest;  HEENT- neg, mallampati1;  Chest- decr BS at bases, clear w/o w/r/r;  Heart- RR w/o m/r/g;  Abd- soft, neg;  Ext- w/o c/c/e;  Neuro- intact  CT Chest => pending IMP/PLAN>>  Katrina Campbell's family has suffered a terrible event & she  has developed incr dyspnea as a result;  We discussed the benefit & roll of Klonopin in this setting & she will try 0.5mg  1/2 to 1 tab Bid;  She is due for her f/u CT Chest & we will plan brief recheck in 6-8 wks... Note: >50% of this 64min ov was spent in counseling & coordination of care...  ADDENDUM>>  CT CHEST 05/31/16 showed mod emphysema, no adenopathy, and the prev nodule along the right minor fissure has resolved;  Other CT findings include atherosclerotic changes in Ao & coronaries, and some dilatation of ascending Ao measuring ~4cm...  She has several risk factors for CAD w/ prev smoking & elev chol;  EKGs reviewed w/ poor R progression from V1=>3...  REC Cardiac eval to establish & follow going forward => referred to [facility]...  ~  June 30, 2016:  6wk ROV & Katrina Campbell has been using the Klonopin 0.5mg - taking 1/2 tab Qhs but not using it during the day- we reviewed her options for taking this med and the expected benefits... In addition she saw CARDS- DrKNelson 06/23/16 for initial eval> note reviewed CAD seen on CT Chest, HBP (not currently on meds), HL (not on meds), ex-smoker, +FamHx heart dis;  She rec Nuclear stress test, and start med rx w/ Cres10...    COPD/emphysema> on Advair, Incruse, Mucinex, Proair; she is stable w/ DOE, min cough, no sput, no hemoptysis, etc...    Hx LUL pneumonia 10/16 that was slow to clear on CXR> finally resolved and serial CT scans showed resolution w/ persistent COPD/emphysema, atherosclerotic changes & ~4cm dilatation of the asc ao...    Ex-smoker w/ 65+pack-yr hx>  She quit smoking in 2014... EXAM shows Afeb, VSS, O2sat=97% on RA at rest;  HEENT- neg, mallampati1;  Chest- decr BS at bases, clear w/o w/r/r;  Heart- RR w/o m/r/g;  Abd- soft, neg;  Ext- w/o c/c/e;  Neuro- intact  EKG 06/23/16>  NSR, rate85, poor R prog V1-2, min NSSTTWA...   Nuclear Stress test 06/28/16>  Felt to be normal w/ breast attenuation but no ischemia, norm BP response, EF=58%,  normal wall motion...  IMP/PLAN>>  Katrina Campbell is stable- she will continue the statin rx per DrNelson & use the Klonopin as we discussed; stable on the Advair/ Incruse/ etc... we plan rov 92mo, sooner prn breathing problems...   ~  March 09, 2017:  62mo ROV & Katrina Campbell presents w/ a 2wk hx cough chest congestion/ some wheezing, incr SOB, productive of light green sput, trace hemoptysis, denies f/c/s, not resting at night;  She had some recent tendonitis in ankle, given voltaren but had to cut back on exercise => we discussed COPD exac & Rx w/ Levaquin & Medrol Dosepak which has worked well for her in the past... We reviewed the following medical problems during today's office visit>      COPD/ Emphysema> on Advair250Bid, Incruse daily, Mucinex 600- 1to2Bid w/ fluids; Proair rescue & Flonase prn; CT Chest showed centrilob & paraseptal emphysema and PFTs 02/2015 showed GOLD Stage 2 disease (FEV1=1.59 (66%).    Hx LUL pneumonia w/ left apical opac on CT 01/2015 slow to clear> f/u CT Chest 2/17 showed resolution of LUL changes    25mm pulm nodule on CT Chest 05/2015> located along the minor fissure => f/u CT Chest 05/2016 w/ resolution of the nodule    Ex-smoker w/ 65+pack-yr hx>  She quit smoking in 2014...    Hx sinusitis w/ surg x2 in the past    Cardiac Issues> on ASA81; coronary & aortic calcif  seen on CT Chest; prev hx of HBP (not on meds now), HL (not prev on meds), exsmoker & +FamHx => CT Chest 05/2016 w/ Asc Ao measure of 4cm, +cardiac risk factors therefore referred to Cardiology & seen by [facility] 06/2016;  She rec Nuclear stress test done 06/2016 felt to be normal w/ breast attenuation but no ischemia, norm BP response, EF=58%, normal wall motion;  She was started on Crestor10...    Medical issues> PCP is DrMcKeown on WellbutrinXL300 to help depression;  EXAM shows Afeb, VSS, O2sat=97% on RA at rest;  HEENT- neg, mallampati1;  Chest- decr BS at bases, clear w/o w/r/r;  Heart- RR w/o m/r/g;  Abd- soft, neg;   Ext- w/o c/c/e;  Neuro- intact IMP/PLAN>>  Katrina Campbell has a COPD exac & we discussed Levaquin500- Qdx7d and Medrol dosepak;  Asked to continue regular meds diligently, consider incr Mucinex600 to 2Bid + fluids;  When able=> back into exercise program & pay attention to her body in terms of chest discomfort, SOB, etc... We plan routine ROV recheck in 8mo w/ f/u CXR, Spirometry, Labs.   ~  February 06, 2018:  40mo ROV & Katrina Campbell did not return as planned earlier this yr having been Burns w/ CP where she ruled out for coronary ischemia, but had a CT Angio Chest that also ruled out for PE but showed 2 new tiny lung nodules measuring 6-30mm in R-ape[place] and 53mm in extreme LLL...  She is an ex-smoker w/ mixed COPD w/ centrilob emphysema and chr bronchitis (quit 2014) on Advair250Bid + Incruse daily and Mucinex 1200mg Bid + fluids;  Spirometry in 2016 showed FEV1=1.59 (66%) & %1sec=54% c/w mod airflow obstruction & GOLD Stage2 COPD;  She notes a mild cough, occas clear sputum, no hemoptysis, occas coughing paroxysms w/ a tickle/ hacking/ feels tight/ ?wheezing;  She has chr stable DOE but exercises on a treadmill daily & has been improving her duration etc... We reviewed the following interval medical notes in Epic-EMR>      She saw CARDS- [email or web address] on 10/10/17>  DrNelson pt- Known coronary calcif on CT Chest & mildly dilated asc ao ~4cm; s/p nuclear stress test w/ excellent functional capacity, no ischemia (+breast attenuation), norm LVEF (58%); no CP/ palpit/ dizziness/ edema; exercising on treadmill daily, she stopped her ASA on her own & was rec to restart.    She was ADM 8/6- 11/08/17 by [facility] w/ c/o CP (this occurred 10 min after eating a carrot)>  Notes reviewed, CXR w/ COPD- NAD, CT Angio Chest was NEG for PE, showed 2 small lung nodules RUL & LLL, atherosclerotic changes & ectatic Ao, COPD/ emphysema, etc;  Adm due to abn EKG w/ poor R progression & sl elev troponin;  Cardiac CATH was performed w/ mild non-obstructive CAD  (20%LAD) and norm LVF- rec for continued medical therapy & risk factor reduction (ASA81, Cres10, FishOil)...    She saw PCP- DrKDurham in [place] on 11/21/17>  Hosp f/u visit, note reviewed, complic in right arm w/ hematoma from the cath=> conservative rx...    She saw CARDS- JHammond,NP on 12/08/17>  Hosp f/u visit, right arm complic improved, back on her treadmill & approaching baseline... We reviewed the following medical problems during today's office visit>      COPD/ Emphysema> on Advair250Bid, Incruse daily, Mucinex 600- 1to2Bid w/ fluids; Proair rescue & Flonase prn; CT Chest showed centrilob & paraseptal emphysema, subseq CTA showed RUL & LLL nodules=> due for f/u scan now; PFTs 02/2015 showed mod airflow obstruction &  GOLD Stage 2 disease (FEV1=1.59 (66%) & %1sec=54%)    Hx LUL pneumonia w/ left apical opac on CT 01/2015 slow to clear> f/u CT Chest 2/17 showed resolution of LUL changes    Abn serial CT Chest scans> nodule located along the minor fissure in 2017 => resolved on scan 2018; 2 nodules noted on CTA 11/2017 are due for f/u scan now...    Ex-smoker w/ 65+pack-yr hx>  She quit smoking in 2014...    Hx sinusitis w/ surg x2 in the past    Cardiac Issues> on ASA81; coronary & aortic calcif seen on CT Chest; prev hx of HBP (not on meds now), HL (not prev on meds- on Cres10 now), exsmoker & +FamHx => CT Chest 2016-06-10 w/ Asc Ao measure of 4cm, +cardiac risk factors therefore referred to Cardiology & seen by [facility] 06/2016;  She rec Nuclear stress test done 06/2016 felt to be normal w/ breast attenuation but no ischemia, norm BP response, EF=58%, normal wall motion;  She was started on ASA81 &Crestor10;  Then ADM 11/2017 w/ CP=> cath showed non-obstructive dis w/ 20% LAD, norm LVEF & rec to continue med rx...    Medical issues> PCP is DrKDurham on Crestor10 for HL; WellbutrinXL300 to help depression (62 y/o daugh passed away 06-10-16 from drug overdose)  EXAM shows Afeb, VSS, O2sat=98% on RA at  rest;  HEENT- neg, mallampati1;  Chest- decr BS at bases, clear w/o w/r/r;  Heart- RR w/o m/r/g;  Abd- soft, neg;  Ext- w/o c/c/e;  Neuro- intact...  CT Chest (w/o contrast) 02/22/18 showed norm heart size, aortic & coronary atherosclerosis w/ ectatic Asc Ao measuring 4.0cm, no adenopathy, severe centrilob emphysema & diffuse bronch wall thickening, prev 22mm LLL & 29mm RUL nodules have resolved, new irreg 62mm LUL nodule identified=> needs f/u scan in about 59mo, mild bronchiectasis in RML... Note- radiology mentioned an indeterm liver focus in inferior left lobe- best seen on 11/2017 CTA, MRI w/ & w/o contrast is rec for further eval IMP/PLAN>>  Katrina Campbell has mod severe COPD/emphysema & her breathing is essentially stable on her regimen of Advair/ Incruse/ Mucinex, and her exercise program;  She knows to avoid infections as much as poss, and treat any URIs promptly to avoid severe resp exacerbations;  Her f/u CTChest showed resolution of the prev RUL & LLL nodules (likely inflammatory) but a new LUL lesion was identified=> this will need yet another f/u CT Chest in about 77mo;  As I am retiring at the end of 2019 I will arrange for Katrina Campbell to see one of my young partners in the 1st half of 2020 with this in mind...  Note too that the report indicates a liver focus in left lobe w/ rec for MRI w/ and w/o contrast to further eval=> I will send notification to her PCPs DrKDurham & DrWPicard in [place]...    Past Medical History  Diagnosis Date  . Depression >> on WellbutrinXL300   . Anxiety   . COPD (chronic obstructive pulmonary disease) (HCC) >> on Advair250 & Incruse   . Hyperlipidemia   . Labile hypertension >> not current on BP meds      Past Surgical History:  Procedure Laterality Date  . LEFT HEART CATH AND CORONARY ANGIOGRAPHY N/A 11/07/2017   Procedure: LEFT HEART CATH AND CORONARY ANGIOGRAPHY;  Surgeon: Nelva Bush, MD;  Location: [facility];  Service: Cardiovascular;  Laterality:  N/A;  . NASAL SINUS SURGERY  1986    Outpatient Encounter Medications  as of 02/06/2018  Medication Sig  . albuterol (PROAIR HFA) 108 (90 BASE) MCG/ACT inhaler Inhale 2 puffs into the lungs every 6 (six) hours as needed for wheezing or shortness of breath.  Marland Kitchen aspirin EC 81 MG tablet Take 81 mg by mouth daily.  Marland Kitchen buPROPion (WELLBUTRIN XL) 300 MG 24 hr tablet TAKE 1 TABLET DAILY  . Calcium Carb-Cholecalciferol (CALCIUM-VITAMIN D) 500-400 MG-UNIT TABS 1 a day  . cholecalciferol (VITAMIN D) 1000 units tablet Take 2,000 Units by mouth daily.  . fluticasone (FLONASE) 50 MCG/ACT nasal spray USE 1 SPRAY IN EACH NOSTRIL TWICE A DAY AS NEEDED FOR ALLERGIES OR RHINITIS  . Fluticasone-Salmeterol (ADVAIR DISKUS) 250-50 MCG/DOSE AEPB USE 1 INHALATION TWICE A DAY  . guaiFENesin (MUCINEX) 600 MG 12 hr tablet Take 1,200 mg by mouth 2 (two) times daily as needed for to loosen phlegm.   Marland Kitchen ibuprofen (ADVIL) 200 MG tablet Take 400 mg by mouth every 8 (eight) hours as needed for fever.  . INCRUSE ELLIPTA 62.5 MCG/INH AEPB USE 1 INHALATION DAILY (DISCARD 6 WEEKS AFTER OPENING FOIL TRAY)  . MAGNESIUM PO Take 1 tablet by mouth daily.   . Omega-3 Fatty Acids (FISH OIL PO) Take 500 mg by mouth daily.  . rosuvastatin (CRESTOR) 10 MG tablet Take 1 tablet (10 mg total) by mouth daily at 6 PM.  . vitamin C (ASCORBIC ACID) 500 MG tablet Take 500 mg by mouth every morning.  . [DISCONTINUED] ADVAIR DISKUS 250-50 MCG/DOSE AEPB USE 1 INHALATION TWICE A DAY   No facility-administered encounter medications on file as of 02/06/2018.     No Known Allergies    There is no immunization history on file for this patient. => need to confirm Flu & Pneumonia vaccinations w/ her PCP...   Current Medications, Allergies, Past Medical History, Past Surgical History, Family History, and Social History were reviewed in [employer] record.   Review of Systems             All symptoms NEG except where BOLDED >>   Constitutional:  F/C/S, fatigue, anorexia, unexpected weight change. HEENT:  HA, visual changes, hearing loss, earache, nasal symptoms, sore throat, mouth sores, hoarseness. Resp:  cough, sputum, hemoptysis; SOB, tightness, wheezing. Cardio:  CP, palpit, DOE, orthopnea, edema. GI:  N/V/D/C, blood in stool; reflux, abd pain, distention, gas. GU:  dysuria, freq, urgency, hematuria, flank pain, voiding difficulty. MS:  joint pain, swelling, tenderness, decr ROM; neck pain, back pain, etc. Neuro:  HA, tremors, seizures, dizziness, syncope, weakness, numbness, gait abn. Skin:  suspicious lesions or skin rash. Heme:  adenopathy, bruising, bleeding. Psyche:  confusion, agitation, sleep disturbance, hallucinations, anxiety, depression suicidal.   Objective:   Physical Exam       Vital Signs:  Reviewed...   General:  WD, WN, 62 y/o WF in NAD; alert & oriented; pleasant & cooperative... HEENT:  [place]/AT; Conjunctiva- pink, Sclera- nonicteric, EOM-wnl, PERRLA, EACs-clear, TMs-wnl; NOSE-clear; THROAT-clear & wnl.  Neck:  Supple w/ fair ROM; no JVD; normal carotid impulses w/o bruits; no thyromegaly or nodules palpated; no lymphadenopathy.  Chest:  Decr BS at bases, clear to P & A x few scat rhonchi & sl congested cough- without wheezes, rales, or signs of consolidation. Heart:  Regular Rhythm; norm S1 & S2 without murmurs, rubs, or gallops detected. Abdomen:  Soft & nontender- no guarding or rebound; normal bowel sounds; no organomegaly or masses palpated. Ext:  Normal ROM; without deformities or arthritic changes; no varicose veins, venous insuffic,  or edema;  Pulses intact w/o bruits. Neuro:  No focal neuro deficits; sensory testing normal; gait normal & balance OK. Derm:  No lesions noted; no rash etc. Lymph:  No cervical, supraclavicular, axillary, or inguinal adenopathy palpated.   Assessment:      IMP >>     COPD/ Emphysema> on Advair250Bid, Incruse daily, Mucinex 600- 1to2Bid; Proair  rescue & Flonase prn; CT Chest showed centrilob & paraseptal emphysema and PFTs showed GOLD Stage 2 disease...    Hx LUL pneumonia w/ left apical opac on CT 01/2015 slow to clear> f/u CT Chest 2/17 showed resolution of LUL changes    80mm pulm nodule on CT Chest 05/2015> located along the minor fissure & f/u due 48yr => f/u CT Chest 05/2016 w/ resolution of the nodule    Ex-smoker w/ 65+pack-yr hx>  She quit smoking in 2014...    Hx sinusitis w/ surg x2 in the past    Cardiac Issues> on ASA81; coronary & aortic calcif seen on CT Chest; prev hx of HBP (not on meds now) => CT Chest 05/2016 w/ Asc Ao measure of 4cm, +cardiac risk factors therefore referred to Cardiology to establish.    Medical issues> PCP is DrMcKeown on WellbutrinXL300 to help depression.Marland Kitchen   PLAN >>  02/10/15>  Amila has mod COPD & quit smoking 2014;  She had a lingular pneumonia in 2015 which resolved to baseline after antibiotics and pred rx;  She presented w/ acute LUL symptoms c/w pneumonia and an abn CXR=> slow to resolve after antibiotic rx;  I told her that in my opinion we should give it a little longer to see if this represents XRay lag time- rec to take AUGMENTIN875Bid/Align, continue MUCINEX(2Bid)/ fluids, continue ADVAIR250Bid, and restart the INCRUSE daily;  We plan ROV in 3wks w/ CXR & to consider timing of a repeat CT, consider PET, etc... 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 CXR today w/ COPD/emphysema & left apex looks ok- we decided to wait 37mo & recheck CT at that time.  05/12/15>  Left apical opac is resolved, new 40mm nodule in right mid-zone noted; for the cough she will continue the Mucinex and for the Dyspnea- advised again to start exercise program... We plan ROV in 32mo. 11/09/15>  Katrina Campbell is reassured about  her breathing; doing satis off cigarettes for 38yrs, taking her inhalers regularly; now needs to incr her exercise program & push the envelope for max improvement- briefly discussed Pulm Rehab but she wants to try it on her own & I agree; we plan ROV recheck & f/u CT Chest in 5mo... 05/19/16>   Katrina Campbell's family has suffered a terrible event & she has developed incr dyspnea as a result;  We discussed the benefit & roll of Klonopin in this setting & she will try 0.5mg  1/2 to 1 tab Bid;  She is due for her f/u CT Chest & we will plan brief recheck in 6-8 wks  06/30/16>   Katrina Campbell is stable- she will continue the statin rx per drNelson & use the Klonopin as we discussed; stable on the Advair/ Incruse/ etc... we plan rov 58mo, sooner prn breathing problems... 03/09/17>   Katrina Campbell has a COPD exac & we discussed Levaquin500- Qdx7d and Medrol dosepak;  Asked to continue regular meds diligently, consider incr  Mucinex600 to 2Bid + fluids;  When able=> back into exercise program & pay attention to her body in terms of chest discomfort, SOB, etc   Plan:     Patient's Medications  New Prescriptions   No medications on file  Previous Medications   ALBUTEROL (PROAIR HFA) 108 (90 BASE) MCG/ACT INHALER    Inhale 2 puffs into the lungs every 6 (six) hours as needed for wheezing or shortness of breath.   ASPIRIN EC 81 MG TABLET    Take 81 mg by mouth daily.   BUPROPION (WELLBUTRIN XL) 300 MG 24 HR TABLET    TAKE 1 TABLET DAILY   CALCIUM CARB-CHOLECALCIFEROL (CALCIUM-VITAMIN D) 500-400 MG-UNIT TABS    1 a day   CHOLECALCIFEROL (VITAMIN D) 1000 UNITS TABLET    Take 2,000 Units by mouth daily.   FLUTICASONE (FLONASE) 50 MCG/ACT NASAL SPRAY    USE 1 SPRAY IN EACH NOSTRIL TWICE A DAY AS NEEDED FOR ALLERGIES OR RHINITIS   GUAIFENESIN (MUCINEX) 600 MG 12 HR TABLET    Take 1,200 mg by mouth 2 (two) times daily as needed for to loosen phlegm.    IBUPROFEN (ADVIL) 200 MG TABLET    Take 400 mg by mouth every 8 (eight) hours as needed  for fever.   INCRUSE ELLIPTA 62.5 MCG/INH AEPB    USE 1 INHALATION DAILY (DISCARD 6 WEEKS AFTER OPENING FOIL TRAY)   MAGNESIUM PO    Take 1 tablet by mouth daily.    OMEGA-3 FATTY ACIDS (FISH OIL PO)    Take 500 mg by mouth daily.   ROSUVASTATIN (CRESTOR) 10 MG TABLET    Take 1 tablet (10 mg total) by mouth daily at 6 PM.   VITAMIN C (ASCORBIC ACID) 500 MG TABLET    Take 500 mg by mouth every morning.  Modified Medications   Modified Medication Previous Medication   FLUTICASONE-SALMETEROL (ADVAIR DISKUS) 250-50 MCG/DOSE AEPB ADVAIR DISKUS 250-50 MCG/DOSE AEPB      USE 1 INHALATION TWICE A DAY    USE 1 INHALATION TWICE A DAY  Discontinued Medications   No medications on file

## 2018-02-13 ENCOUNTER — Encounter: Payer: Self-pay | Admitting: Family Medicine

## 2018-02-13 ENCOUNTER — Ambulatory Visit (INDEPENDENT_AMBULATORY_CARE_PROVIDER_SITE_OTHER): Admitting: Family Medicine

## 2018-02-13 ENCOUNTER — Other Ambulatory Visit: Payer: Self-pay | Admitting: Family Medicine

## 2018-02-13 ENCOUNTER — Ambulatory Visit
Admission: RE | Admit: 2018-02-13 | Discharge: 2018-02-13 | Disposition: A | Discharge status: Home / Self Care | Source: Ambulatory Visit | Attending: Family Medicine | Admitting: Family Medicine

## 2018-02-13 VITALS — BP 164/90 | HR 72 | Temp 97.5°F | Resp 18 | Ht 64.0 in | Wt 132.0 lb

## 2018-02-13 DIAGNOSIS — R079 Chest pain, unspecified: Secondary | ICD-10-CM | POA: Diagnosis not present

## 2018-02-13 MED ORDER — HYDROCODONE-HOMATROPINE 5-1.5 MG/5ML PO SYRP: 5.0000 mL | ORAL_SOLUTION | Freq: Three times a day (TID) | ORAL | 0 refills | Status: DC | PRN

## 2018-02-13 NOTE — Progress Notes (Signed)
Subjective:    Patient ID: Katrina Campbell, female    DOB: 1956/02/19, 62 y.o.   MRN: [id number]  HPI Patient symptoms began Saturday.  Started with an upper respiratory infection including head congestion, sore throat, chest congestion, cough.  However she developed left-sided chest pain.  It is worse when she sitting up.  If she lies down it is better.  She denies any pain with deep inspiration.  She does have pain with coughing.  There is no pain with palpation of the sternum or the ribs.  I am unable to reproduce the pain on exam.  She is not coughing up any blood.  She denies any recent travel.  She denies any prolonged immobilization.  Denies any leg swelling or sign of DVT.  Patient had a normal stress test in March 2018.  Is no evidence of reversible ischemia at that time.  She had a left heart catheterization in August 2019.  The results are dictated below: Conclusions: 1. Eccentric calcification with mild stenosis of the ostial LAD (~20%).  Otherwise, no angiographically significant coronary artery disease. 2. Normal left ventricular systolic function and filling pressure.  On exam today, there are no audible crackles or rails or wheezing on pulmonary exam.  There is no JVD.  There is no peripheral edema.  The remainder of her exam is reassuring.  There is no evidence of a serious bacterial infection. EKG today shows normal sinus rhythm with normal intervals.  There is no evidence of ischemia or infarction.  However it is a poor quality EKG with significant motion artifact.  I see no evidence of ST segment elevation or PR depression that would suggest pericarditis.  Past Medical History:  Diagnosis Date  . Anxiety   . COPD (chronic obstructive pulmonary disease) ([place])   . Depression   . Hyperlipidemia   . Labile hypertension    Past Surgical History:  Procedure Laterality Date  . LEFT HEART CATH AND CORONARY ANGIOGRAPHY N/A 11/07/2017   Procedure: LEFT HEART CATH AND CORONARY  ANGIOGRAPHY;  Surgeon: Nelva Bush, MD;  Location: [facility];  Service: Cardiovascular;  Laterality: N/A;  . NASAL SINUS SURGERY  1986   Current Outpatient Medications on File Prior to Visit  Medication Sig Dispense Refill  . albuterol (PROAIR HFA) 108 (90 BASE) MCG/ACT inhaler Inhale 2 puffs into the lungs every 6 (six) hours as needed for wheezing or shortness of breath. 18 g 3  . aspirin EC 81 MG tablet Take 81 mg by mouth daily.    Marland Kitchen buPROPion (WELLBUTRIN XL) 300 MG 24 hr tablet TAKE 1 TABLET DAILY 90 tablet 4  . Calcium Carb-Cholecalciferol (CALCIUM-VITAMIN D) 500-400 MG-UNIT TABS 1 a day 60 tablet   . cholecalciferol (VITAMIN D) 1000 units tablet Take 2,000 Units by mouth daily.    . fluticasone (FLONASE) 50 MCG/ACT nasal spray USE 1 SPRAY IN EACH NOSTRIL TWICE A DAY AS NEEDED FOR ALLERGIES OR RHINITIS 48 g 0  . Fluticasone-Salmeterol (ADVAIR DISKUS) 250-50 MCG/DOSE AEPB USE 1 INHALATION TWICE A DAY 180 each 2  . guaiFENesin (MUCINEX) 600 MG 12 hr tablet Take 1,200 mg by mouth 2 (two) times daily as needed for to loosen phlegm.     Marland Kitchen ibuprofen (ADVIL) 200 MG tablet Take 400 mg by mouth every 8 (eight) hours as needed for fever.    . INCRUSE ELLIPTA 62.5 MCG/INH AEPB USE 1 INHALATION DAILY (DISCARD 6 WEEKS AFTER OPENING FOIL TRAY) 90 each 1  . MAGNESIUM PO  Take 1 tablet by mouth daily.     . Omega-3 Fatty Acids (FISH OIL PO) Take 500 mg by mouth daily.    . rosuvastatin (CRESTOR) 10 MG tablet Take 1 tablet (10 mg total) by mouth daily at 6 PM. 30 tablet 0  . vitamin C (ASCORBIC ACID) 500 MG tablet Take 500 mg by mouth every morning.     No current facility-administered medications on file prior to visit.      Past Medical History:  Diagnosis Date  . Anxiety   . COPD (chronic obstructive pulmonary disease) ([place])   . Depression   . Hyperlipidemia   . Labile hypertension    Past Surgical History:  Procedure Laterality Date  . LEFT HEART CATH AND CORONARY ANGIOGRAPHY  N/A 11/07/2017   Procedure: LEFT HEART CATH AND CORONARY ANGIOGRAPHY;  Surgeon: Nelva Bush, MD;  Location: [facility];  Service: Cardiovascular;  Laterality: N/A;  . NASAL SINUS SURGERY  1986   Current Outpatient Medications on File Prior to Visit  Medication Sig Dispense Refill  . albuterol (PROAIR HFA) 108 (90 BASE) MCG/ACT inhaler Inhale 2 puffs into the lungs every 6 (six) hours as needed for wheezing or shortness of breath. 18 g 3  . aspirin EC 81 MG tablet Take 81 mg by mouth daily.    Marland Kitchen buPROPion (WELLBUTRIN XL) 300 MG 24 hr tablet TAKE 1 TABLET DAILY 90 tablet 4  . Calcium Carb-Cholecalciferol (CALCIUM-VITAMIN D) 500-400 MG-UNIT TABS 1 a day 60 tablet   . cholecalciferol (VITAMIN D) 1000 units tablet Take 2,000 Units by mouth daily.    . fluticasone (FLONASE) 50 MCG/ACT nasal spray USE 1 SPRAY IN EACH NOSTRIL TWICE A DAY AS NEEDED FOR ALLERGIES OR RHINITIS 48 g 0  . Fluticasone-Salmeterol (ADVAIR DISKUS) 250-50 MCG/DOSE AEPB USE 1 INHALATION TWICE A DAY 180 each 2  . guaiFENesin (MUCINEX) 600 MG 12 hr tablet Take 1,200 mg by mouth 2 (two) times daily as needed for to loosen phlegm.     Marland Kitchen ibuprofen (ADVIL) 200 MG tablet Take 400 mg by mouth every 8 (eight) hours as needed for fever.    . INCRUSE ELLIPTA 62.5 MCG/INH AEPB USE 1 INHALATION DAILY (DISCARD 6 WEEKS AFTER OPENING FOIL TRAY) 90 each 1  . MAGNESIUM PO Take 1 tablet by mouth daily.     . Omega-3 Fatty Acids (FISH OIL PO) Take 500 mg by mouth daily.    . rosuvastatin (CRESTOR) 10 MG tablet Take 1 tablet (10 mg total) by mouth daily at 6 PM. 30 tablet 0  . vitamin C (ASCORBIC ACID) 500 MG tablet Take 500 mg by mouth every morning.     No current facility-administered medications on file prior to visit.    No Known Allergies Social History   Socioeconomic History  . Marital status: Married    Spouse name: Not on file  . Number of children: Not on file  . Years of education: Not on file  . Highest education  level: Not on file  Occupational History  . Not on file  Social Needs  . Financial resource strain: Not on file  . Food insecurity:    Worry: Not on file    Inability: Not on file  . Transportation needs:    Medical: Not on file    Non-medical: Not on file  Tobacco Use  . Smoking status: Former Smoker    Packs/day: 1.00    Years: 40.00    Pack years: 40.00  Types: Cigarettes    Last attempt to quit: 11/09/2012    Years since quitting: 5.2  . Smokeless tobacco: Never Used  . Tobacco comment: vapor  Substance and Sexual Activity  . Alcohol use: Yes    Alcohol/week: 24.0 standard drinks    Types: 24 Cans of beer per week  . Drug use: No  . Sexual activity: Not Currently  Lifestyle  . Physical activity:    Days per week: Not on file    Minutes per session: Not on file  . Stress: Not on file  Relationships  . Social connections:    Talks on phone: Not on file    Gets together: Not on file    Attends religious service: Not on file    Active member of club or organization: Not on file    Attends meetings of clubs or organizations: Not on file    Relationship status: Not on file  . Intimate partner violence:    Fear of current or ex partner: Not on file    Emotionally abused: Not on file    Physically abused: Not on file    Forced sexual activity: Not on file  Other Topics Concern  . Not on file  Social History Narrative  . Not on file     Review of Systems  All other systems reviewed and are negative.      Objective:   Physical Exam  Constitutional: She appears well-developed and well-nourished. No distress.  HENT:  Head: Normocephalic and atraumatic.  Right Ear: External ear normal.  Left Ear: External ear normal.  Nose: Nose normal.  Mouth/Throat: Oropharynx is clear and moist. No oropharyngeal exudate.  Cardiovascular: Normal rate, regular rhythm and normal heart sounds. Exam reveals no gallop and no friction rub.  No murmur heard. Pulmonary/Chest:  Effort normal and breath sounds normal. No stridor. No respiratory distress. She has no wheezes. She has no rales.  Abdominal: Soft. Bowel sounds are normal. She exhibits no distension and no mass. There is no tenderness. There is no rebound and no guarding. No hernia.  Lymphadenopathy:    She has no cervical adenopathy.  Skin: She is not diaphoretic.  Vitals reviewed.         Assessment & Plan:  Chest pain, unspecified type - Plan: EKG 12-Lead, DG Chest 2 View, CBC with Differential/Platelet, COMPLETE METABOLIC PANEL WITH GFR, D-dimer, quantitative (not at [facility])  Pain is nonspecific.  I will check a d-dimer.  If negative I believe this will fully rule out pulmonary embolism.  If positive, I will get a CT Joetta Manners of the chest to evaluate for PE.  I believe the most likely explanation is musculoskeletal chest wall pain related to coughing from an upper respiratory infection.  However I will also obtain a chest x-ray to evaluate for subtle pneumonia that may be causing her left-sided chest pain.  Also obtain a CBC to evaluate for leukocytosis.  Await the results of the chest x-ray to determine further treatment course.  Right now her symptoms sound to me to be more of a viral upper respiratory infection with chest wall pain.

## 2018-02-14 ENCOUNTER — Other Ambulatory Visit: Payer: Self-pay | Admitting: Cardiology

## 2018-02-14 DIAGNOSIS — R0600 Dyspnea, unspecified: Secondary | ICD-10-CM

## 2018-02-14 DIAGNOSIS — E785 Hyperlipidemia, unspecified: Secondary | ICD-10-CM

## 2018-02-14 DIAGNOSIS — R0609 Other forms of dyspnea: Secondary | ICD-10-CM

## 2018-02-14 DIAGNOSIS — R9389 Abnormal findings on diagnostic imaging of other specified body structures: Secondary | ICD-10-CM

## 2018-02-14 DIAGNOSIS — I700 Atherosclerosis of aorta: Secondary | ICD-10-CM

## 2018-02-14 LAB — CBC WITH DIFFERENTIAL/PLATELET
Basophils Absolute: 8 cells/uL (ref 0–200)
Basophils Relative: 0.2 %
Eosinophils Absolute: 29 cells/uL (ref 15–500)
Eosinophils Relative: 0.7 %
HCT: 43.8 % (ref 35.0–45.0)
Hemoglobin: 15.1 g/dL (ref 11.7–15.5)
Lymphs Abs: 1451 cells/uL (ref 850–3900)
MCH: 30.1 pg (ref 27.0–33.0)
MCHC: 34.5 g/dL (ref 32.0–36.0)
MCV: 87.4 fL (ref 80.0–100.0)
MPV: 12.5 fL (ref 7.5–12.5)
Monocytes Relative: 10.7 %
Neutro Abs: 2173 cells/uL (ref 1500–7800)
Neutrophils Relative %: 53 %
Platelets: 121 10*3/uL — ABNORMAL LOW (ref 140–400)
RBC: 5.01 10*6/uL (ref 3.80–5.10)
RDW: 12.7 % (ref 11.0–15.0)
Total Lymphocyte: 35.4 %
WBC mixed population: 439 cells/uL (ref 200–950)
WBC: 4.1 10*3/uL (ref 3.8–10.8)

## 2018-02-14 LAB — COMPLETE METABOLIC PANEL WITH GFR
AG RATIO: 1.9 (calc) (ref 1.0–2.5)
ALBUMIN MSPROF: 4.3 g/dL (ref 3.6–5.1)
ALT: 39 U/L — ABNORMAL HIGH (ref 6–29)
AST: 41 U/L — ABNORMAL HIGH (ref 10–35)
Alkaline phosphatase (APISO): 83 U/L (ref 33–130)
BILIRUBIN TOTAL: 0.2 mg/dL (ref 0.2–1.2)
BUN: 16 mg/dL (ref 7–25)
CALCIUM: 9.4 mg/dL (ref 8.6–10.4)
CO2: 27 mmol/L (ref 20–32)
Chloride: 108 mmol/L (ref 98–110)
Creat: 0.78 mg/dL (ref 0.50–0.99)
GFR, Est African American: 93 mL/min/{1.73_m2} (ref >=60)
GFR, Est Non African American: 80 mL/min/{1.73_m2} (ref >=60)
GLOBULIN: 2.3 g/dL (ref 1.9–3.7)
Glucose, Bld: 87 mg/dL (ref 65–99)
POTASSIUM: 4.4 mmol/L (ref 3.5–5.3)
SODIUM: 144 mmol/L (ref 135–146)
TOTAL PROTEIN: 6.6 g/dL (ref 6.1–8.1)

## 2018-02-14 LAB — D-DIMER, QUANTITATIVE (NOT AT ARMC): D DIMER QUANT: 0.36 ug{FEU}/mL (ref <0.50)

## 2018-02-14 MED ORDER — ROSUVASTATIN CALCIUM 10 MG PO TABS: 10.0000 mg | ORAL_TABLET | Freq: Every day | ORAL | 3 refills | Status: DC

## 2018-02-14 NOTE — Telephone Encounter (Signed)
Pt's medication was sent to pt's pharmacy as requested. Confirmation received.  °

## 2018-02-16 ENCOUNTER — Ambulatory Visit (INDEPENDENT_AMBULATORY_CARE_PROVIDER_SITE_OTHER): Admitting: Family Medicine

## 2018-02-16 ENCOUNTER — Encounter: Payer: Self-pay | Admitting: Family Medicine

## 2018-02-16 VITALS — BP 150/82 | HR 74 | Temp 98.2°F | Resp 16 | Wt 132.0 lb

## 2018-02-16 DIAGNOSIS — J069 Acute upper respiratory infection, unspecified: Secondary | ICD-10-CM

## 2018-02-16 DIAGNOSIS — R079 Chest pain, unspecified: Secondary | ICD-10-CM | POA: Diagnosis not present

## 2018-02-16 MED ORDER — AZITHROMYCIN 250 MG PO TABS: ORAL_TABLET | ORAL | 0 refills | Status: DC

## 2018-02-16 NOTE — Progress Notes (Signed)
Subjective:    Patient ID: Katrina Campbell, female    DOB: [birth year], 62 y.o.   MRN: [id number]  HPI  02/13/18 Patient symptoms began Saturday.  Started with an upper respiratory infection including head congestion, sore throat, chest congestion, cough.  However she developed left-sided chest pain.  It is worse when she sitting up.  If she lies down it is better.  She denies any pain with deep inspiration.  She does have pain with coughing.  There is no pain with palpation of the sternum or the ribs.  I am unable to reproduce the pain on exam.  She is not coughing up any blood.  She denies any recent travel.  She denies any prolonged immobilization.  Denies any leg swelling or sign of DVT.  Patient had a normal stress test in March 2018.  Is no evidence of reversible ischemia at that time.  She had a left heart catheterization in August 2019.  The results are dictated below: Conclusions: 1. Eccentric calcification with mild stenosis of the ostial LAD (~20%).  Otherwise, no angiographically significant coronary artery disease. 2. Normal left ventricular systolic function and filling pressure.  On exam today, there are no audible crackles or rails or wheezing on pulmonary exam.  There is no JVD.  There is no peripheral edema.  The remainder of her exam is reassuring.  There is no evidence of a serious bacterial infection. EKG today shows normal sinus rhythm with normal intervals.  There is no evidence of ischemia or infarction.  However it is a poor quality EKG with significant motion artifact.  I see no evidence of ST segment elevation or PR depression that would suggest pericarditis.  At that time, my plan was: Pain is nonspecific.  I will check a d-dimer.  If negative I believe this will fully rule out pulmonary embolism.  If positive, I will get a CT Joetta Manners of the chest to evaluate for PE.  I believe the most likely explanation is musculoskeletal chest wall pain related to coughing from an upper  respiratory infection.  However I will also obtain a chest x-ray to evaluate for subtle pneumonia that may be causing her left-sided chest pain.  Also obtain a CBC to evaluate for leukocytosis.  Await the results of the chest x-ray to determine further treatment course.  Right now her symptoms sound to me to be more of a viral upper respiratory infection with chest wall pain.  02/16/18 Patient states she feels a little bit better.  The pain in her left chest has improved on Advil.  She now has some pain in her right flank and in her right mid axillary line.  She continues to have cough.  She continues to have diffuse myalgias.  However she is now having copious rhinorrhea and sinus drainage.  She has head congestion.  She continues to to remain afebrile.  She denies any significant sinus pain.  She has no sore throat.  She denies any otalgia.  She denies any purulent sputum.  She denies any hemoptysis.  She denies any significant pleurisy although it does hurt to cough.  Test x-ray was clear.  D-dimer was negative.  CBC was normal  Past Medical History:  Diagnosis Date  . Anxiety   . COPD (chronic obstructive pulmonary disease) ([place])   . Depression   . Hyperlipidemia   . Labile hypertension    Past Surgical History:  Procedure Laterality Date  . LEFT HEART CATH AND CORONARY ANGIOGRAPHY N/A  11/07/2017   Procedure: LEFT HEART CATH AND CORONARY ANGIOGRAPHY;  Surgeon: Nelva Bush, MD;  Location: [facility];  Service: Cardiovascular;  Laterality: N/A;  . NASAL SINUS SURGERY  1986   Current Outpatient Medications on File Prior to Visit  Medication Sig Dispense Refill  . albuterol (PROAIR HFA) 108 (90 BASE) MCG/ACT inhaler Inhale 2 puffs into the lungs every 6 (six) hours as needed for wheezing or shortness of breath. 18 g 3  . aspirin EC 81 MG tablet Take 81 mg by mouth daily.    Marland Kitchen buPROPion (WELLBUTRIN XL) 300 MG 24 hr tablet TAKE 1 TABLET DAILY 90 tablet 4  . Calcium  Carb-Cholecalciferol (CALCIUM-VITAMIN D) 500-400 MG-UNIT TABS 1 a day 60 tablet   . cholecalciferol (VITAMIN D) 1000 units tablet Take 2,000 Units by mouth daily.    . fluticasone (FLONASE) 50 MCG/ACT nasal spray USE 1 SPRAY IN EACH NOSTRIL TWICE A DAY AS NEEDED FOR ALLERGIES OR RHINITIS 48 g 0  . Fluticasone-Salmeterol (ADVAIR DISKUS) 250-50 MCG/DOSE AEPB USE 1 INHALATION TWICE A DAY 180 each 2  . guaiFENesin (MUCINEX) 600 MG 12 hr tablet Take 1,200 mg by mouth 2 (two) times daily as needed for to loosen phlegm.     Marland Kitchen ibuprofen (ADVIL) 200 MG tablet Take 400 mg by mouth every 8 (eight) hours as needed for fever.    . INCRUSE ELLIPTA 62.5 MCG/INH AEPB USE 1 INHALATION DAILY (DISCARD 6 WEEKS AFTER OPENING FOIL TRAY) 90 each 1  . MAGNESIUM PO Take 1 tablet by mouth daily.     . Omega-3 Fatty Acids (FISH OIL PO) Take 500 mg by mouth daily.    . rosuvastatin (CRESTOR) 10 MG tablet Take 1 tablet (10 mg total) by mouth daily at 6 PM. 90 tablet 3  . vitamin C (ASCORBIC ACID) 500 MG tablet Take 500 mg by mouth every morning.    Marland Kitchen HYDROcodone-homatropine (HYCODAN) 5-1.5 MG/5ML syrup Take 5 mLs by mouth every 8 (eight) hours as needed for cough. (Patient not taking: Reported on 02/16/2018) 120 mL 0   No current facility-administered medications on file prior to visit.      Past Medical History:  Diagnosis Date  . Anxiety   . COPD (chronic obstructive pulmonary disease) ([place])   . Depression   . Hyperlipidemia   . Labile hypertension    Past Surgical History:  Procedure Laterality Date  . LEFT HEART CATH AND CORONARY ANGIOGRAPHY N/A 11/07/2017   Procedure: LEFT HEART CATH AND CORONARY ANGIOGRAPHY;  Surgeon: Nelva Bush, MD;  Location: [facility];  Service: Cardiovascular;  Laterality: N/A;  . NASAL SINUS SURGERY  1986   Current Outpatient Medications on File Prior to Visit  Medication Sig Dispense Refill  . albuterol (PROAIR HFA) 108 (90 BASE) MCG/ACT inhaler Inhale 2 puffs into the  lungs every 6 (six) hours as needed for wheezing or shortness of breath. 18 g 3  . aspirin EC 81 MG tablet Take 81 mg by mouth daily.    Marland Kitchen buPROPion (WELLBUTRIN XL) 300 MG 24 hr tablet TAKE 1 TABLET DAILY 90 tablet 4  . Calcium Carb-Cholecalciferol (CALCIUM-VITAMIN D) 500-400 MG-UNIT TABS 1 a day 60 tablet   . cholecalciferol (VITAMIN D) 1000 units tablet Take 2,000 Units by mouth daily.    . fluticasone (FLONASE) 50 MCG/ACT nasal spray USE 1 SPRAY IN EACH NOSTRIL TWICE A DAY AS NEEDED FOR ALLERGIES OR RHINITIS 48 g 0  . Fluticasone-Salmeterol (ADVAIR DISKUS) 250-50 MCG/DOSE AEPB USE  1 INHALATION TWICE A DAY 180 each 2  . guaiFENesin (MUCINEX) 600 MG 12 hr tablet Take 1,200 mg by mouth 2 (two) times daily as needed for to loosen phlegm.     Marland Kitchen ibuprofen (ADVIL) 200 MG tablet Take 400 mg by mouth every 8 (eight) hours as needed for fever.    . INCRUSE ELLIPTA 62.5 MCG/INH AEPB USE 1 INHALATION DAILY (DISCARD 6 WEEKS AFTER OPENING FOIL TRAY) 90 each 1  . MAGNESIUM PO Take 1 tablet by mouth daily.     . Omega-3 Fatty Acids (FISH OIL PO) Take 500 mg by mouth daily.    . rosuvastatin (CRESTOR) 10 MG tablet Take 1 tablet (10 mg total) by mouth daily at 6 PM. 90 tablet 3  . vitamin C (ASCORBIC ACID) 500 MG tablet Take 500 mg by mouth every morning.    Marland Kitchen HYDROcodone-homatropine (HYCODAN) 5-1.5 MG/5ML syrup Take 5 mLs by mouth every 8 (eight) hours as needed for cough. (Patient not taking: Reported on 02/16/2018) 120 mL 0   No current facility-administered medications on file prior to visit.    No Known Allergies Social History   Socioeconomic History  . Marital status: Married    Spouse name: Not on file  . Number of children: Not on file  . Years of education: Not on file  . Highest education level: Not on file  Occupational History  . Not on file  Social Needs  . Financial resource strain: Not on file  . Food insecurity:    Worry: Not on file    Inability: Not on file  . Transportation  needs:    Medical: Not on file    Non-medical: Not on file  Tobacco Use  . Smoking status: Former Smoker    Packs/day: 1.00    Years: 40.00    Pack years: 40.00    Types: Cigarettes    Last attempt to quit: 11/09/2012    Years since quitting: 5.2  . Smokeless tobacco: Never Used  . Tobacco comment: vapor  Substance and Sexual Activity  . Alcohol use: Yes    Alcohol/week: 24.0 standard drinks    Types: 24 Cans of beer per week  . Drug use: No  . Sexual activity: Not Currently  Lifestyle  . Physical activity:    Days per week: Not on file    Minutes per session: Not on file  . Stress: Not on file  Relationships  . Social connections:    Talks on phone: Not on file    Gets together: Not on file    Attends religious service: Not on file    Active member of club or organization: Not on file    Attends meetings of clubs or organizations: Not on file    Relationship status: Not on file  . Intimate partner violence:    Fear of current or ex partner: Not on file    Emotionally abused: Not on file    Physically abused: Not on file    Forced sexual activity: Not on file  Other Topics Concern  . Not on file  Social History Narrative  . Not on file     Review of Systems  Cardiovascular: Positive for chest pain.  All other systems reviewed and are negative.      Objective:   Physical Exam  Constitutional: She appears well-developed and well-nourished. No distress.  HENT:  Head: Normocephalic and atraumatic.  Right Ear: External ear normal.  Left Ear: External ear normal.  Nose: Nose normal.  Mouth/Throat: Oropharynx is clear and moist. No oropharyngeal exudate.  Cardiovascular: Normal rate, regular rhythm and normal heart sounds. Exam reveals no gallop and no friction rub.  No murmur heard. Pulmonary/Chest: Effort normal and breath sounds normal. No stridor. No respiratory distress. She has no wheezes. She has no rales.  Abdominal: Soft. Bowel sounds are normal. She  exhibits no distension and no mass. There is no tenderness. There is no rebound and no guarding. No hernia.  Lymphadenopathy:    She has no cervical adenopathy.  Skin: She is not diaphoretic.  Vitals reviewed.         Assessment & Plan:  Chest pain, unspecified type  Viral URI  Physical exam is unremarkable.  I am reassured by the fact the patient is gradually improving.  I still suspect an underlying viral illness/upper respiratory infection with pleurisy and musculoskeletal chest wall pain.  I recommended tincture of time.  I did give her a Z-Pak.  If symptoms worsen, she develops high fever, purulent sputum, I want her to start the antibiotics.  Recheck next week if no better.  Seek medical attention immediately if worsening.  I recommended Xyzal for head congestion.  She does have mild elevations in her liver function test which I believe are likely due to the virus.  I recommended rechecking a CMP next week.

## 2018-02-21 ENCOUNTER — Other Ambulatory Visit

## 2018-02-21 DIAGNOSIS — R945 Abnormal results of liver function studies: Principal | ICD-10-CM

## 2018-02-21 DIAGNOSIS — R7989 Other specified abnormal findings of blood chemistry: Secondary | ICD-10-CM

## 2018-02-22 ENCOUNTER — Ambulatory Visit (INDEPENDENT_AMBULATORY_CARE_PROVIDER_SITE_OTHER)
Admission: RE | Admit: 2018-02-22 | Discharge: 2018-02-22 | Disposition: A | Discharge status: Home / Self Care | Source: Ambulatory Visit | Attending: Pulmonary Disease | Admitting: Pulmonary Disease

## 2018-02-22 DIAGNOSIS — R918 Other nonspecific abnormal finding of lung field: Secondary | ICD-10-CM

## 2018-02-22 DIAGNOSIS — R9389 Abnormal findings on diagnostic imaging of other specified body structures: Secondary | ICD-10-CM

## 2018-02-23 ENCOUNTER — Other Ambulatory Visit: Payer: Self-pay | Admitting: Pulmonary Disease

## 2018-02-23 DIAGNOSIS — R918 Other nonspecific abnormal finding of lung field: Secondary | ICD-10-CM

## 2018-02-27 LAB — COMPREHENSIVE METABOLIC PANEL
AG Ratio: 2 (calc) (ref 1.0–2.5)
ALT: 26 U/L (ref 6–29)
AST: 29 U/L (ref 10–35)
Albumin: 4.2 g/dL (ref 3.6–5.1)
Alkaline phosphatase (APISO): 83 U/L (ref 33–130)
BUN: 14 mg/dL (ref 7–25)
CO2: 28 mmol/L (ref 20–32)
Calcium: 9.2 mg/dL (ref 8.6–10.4)
Chloride: 108 mmol/L (ref 98–110)
Creat: 0.78 mg/dL (ref 0.50–0.99)
GLUCOSE: 94 mg/dL (ref 65–99)
Globulin: 2.1 g/dL (calc) (ref 1.9–3.7)
Potassium: 4.4 mmol/L (ref 3.5–5.3)
SODIUM: 144 mmol/L (ref 135–146)
TOTAL PROTEIN: 6.3 g/dL (ref 6.1–8.1)
Total Bilirubin: 0.4 mg/dL (ref 0.2–1.2)

## 2018-02-27 LAB — TEST AUTHORIZATION

## 2018-02-27 LAB — LIPID PANEL
CHOL/HDL RATIO: 3.4 (calc) (ref <5.0)
Cholesterol: 151 mg/dL (ref <200)
HDL: 45 mg/dL — AB (ref >50)
LDL CHOLESTEROL (CALC): 83 mg/dL
Non-HDL Cholesterol (Calc): 106 mg/dL (calc) (ref <130)
TRIGLYCERIDES: 126 mg/dL (ref <150)

## 2018-03-09 ENCOUNTER — Telehealth: Payer: Self-pay | Admitting: Family Medicine

## 2018-03-09 NOTE — Telephone Encounter (Signed)
Patient called in stating that she was having bouts of diarrhea, and vomiting with some muscle aches. Patient asked if there was anything otc that she could take. Advised patient to that she could alternate between advil and tylenol and to sip clear liquids to avoid dehydration as well. Advised patient that if symptoms worsen to go to urgent care. Patient verbalized understanding.

## 2018-04-03 ENCOUNTER — Telehealth: Payer: Self-pay | Admitting: Pulmonary Disease

## 2018-04-03 NOTE — Telephone Encounter (Signed)
Called and spoke with patient, she is aware and verbalized understanding. Patient will start medications OTC but does not want to make a visit at this time.

## 2018-04-03 NOTE — Telephone Encounter (Signed)
Patient will need an office visit. She can start mucinex and delsym until she can be seen.

## 2018-04-03 NOTE — Telephone Encounter (Signed)
Called and spoke with patient, she stated that she has been having a headache with congestion and productive cough with green mucus. Patient denies fever, no body aches or chills. Patient would like something to be called in.

## 2018-04-24 ENCOUNTER — Other Ambulatory Visit: Payer: Self-pay | Admitting: Pulmonary Disease

## 2018-04-25 ENCOUNTER — Other Ambulatory Visit: Payer: Self-pay | Admitting: Family Medicine

## 2018-04-25 MED ORDER — FLUTICASONE PROPIONATE 50 MCG/ACT NA SUSP: 2.0000 | Freq: Every day | NASAL | 3 refills | Status: DC

## 2018-05-29 ENCOUNTER — Other Ambulatory Visit: Payer: Self-pay

## 2018-05-29 ENCOUNTER — Emergency Department (HOSPITAL_COMMUNITY)

## 2018-05-29 ENCOUNTER — Telehealth: Payer: Self-pay | Admitting: Family Medicine

## 2018-05-29 ENCOUNTER — Encounter (HOSPITAL_COMMUNITY): Payer: Self-pay

## 2018-05-29 ENCOUNTER — Observation Stay (HOSPITAL_COMMUNITY)
Admission: EM | Admit: 2018-05-29 | Discharge: 2018-05-30 | Disposition: A | Discharge status: Home / Self Care | Attending: Internal Medicine | Admitting: Internal Medicine

## 2018-05-29 DIAGNOSIS — I25119 Atherosclerotic heart disease of native coronary artery with unspecified angina pectoris: Principal | ICD-10-CM | POA: Insufficient documentation

## 2018-05-29 DIAGNOSIS — F32A Depression, unspecified: Secondary | ICD-10-CM | POA: Diagnosis present

## 2018-05-29 DIAGNOSIS — J439 Emphysema, unspecified: Secondary | ICD-10-CM | POA: Insufficient documentation

## 2018-05-29 DIAGNOSIS — Z7982 Long term (current) use of aspirin: Secondary | ICD-10-CM | POA: Diagnosis not present

## 2018-05-29 DIAGNOSIS — Z87891 Personal history of nicotine dependence: Secondary | ICD-10-CM | POA: Diagnosis not present

## 2018-05-29 DIAGNOSIS — Z79899 Other long term (current) drug therapy: Secondary | ICD-10-CM | POA: Diagnosis not present

## 2018-05-29 DIAGNOSIS — I10 Essential (primary) hypertension: Secondary | ICD-10-CM | POA: Diagnosis not present

## 2018-05-29 DIAGNOSIS — F419 Anxiety disorder, unspecified: Secondary | ICD-10-CM | POA: Insufficient documentation

## 2018-05-29 DIAGNOSIS — I712 Thoracic aortic aneurysm, without rupture: Secondary | ICD-10-CM | POA: Diagnosis not present

## 2018-05-29 DIAGNOSIS — F329 Major depressive disorder, single episode, unspecified: Secondary | ICD-10-CM | POA: Insufficient documentation

## 2018-05-29 DIAGNOSIS — R079 Chest pain, unspecified: Secondary | ICD-10-CM | POA: Diagnosis present

## 2018-05-29 DIAGNOSIS — E785 Hyperlipidemia, unspecified: Secondary | ICD-10-CM | POA: Diagnosis not present

## 2018-05-29 DIAGNOSIS — I700 Atherosclerosis of aorta: Secondary | ICD-10-CM | POA: Diagnosis not present

## 2018-05-29 DIAGNOSIS — Z8249 Family history of ischemic heart disease and other diseases of the circulatory system: Secondary | ICD-10-CM | POA: Insufficient documentation

## 2018-05-29 DIAGNOSIS — Z791 Long term (current) use of non-steroidal anti-inflammatories (NSAID): Secondary | ICD-10-CM | POA: Insufficient documentation

## 2018-05-29 DIAGNOSIS — Z7951 Long term (current) use of inhaled steroids: Secondary | ICD-10-CM | POA: Insufficient documentation

## 2018-05-29 DIAGNOSIS — M8580 Other specified disorders of bone density and structure, unspecified site: Secondary | ICD-10-CM | POA: Insufficient documentation

## 2018-05-29 DIAGNOSIS — I2510 Atherosclerotic heart disease of native coronary artery without angina pectoris: Secondary | ICD-10-CM | POA: Diagnosis present

## 2018-05-29 DIAGNOSIS — I209 Angina pectoris, unspecified: Secondary | ICD-10-CM | POA: Diagnosis not present

## 2018-05-29 DIAGNOSIS — J449 Chronic obstructive pulmonary disease, unspecified: Secondary | ICD-10-CM | POA: Diagnosis present

## 2018-05-29 LAB — I-STAT TROPONIN, ED: TROPONIN I, POC: 0.01 ng/mL (ref 0.00–0.08)

## 2018-05-29 LAB — TROPONIN I
Troponin I: <0.03 ng/mL (ref <0.03)
Troponin I: <0.03 ng/mL (ref <0.03)

## 2018-05-29 LAB — BASIC METABOLIC PANEL
ANION GAP: 4 — AB (ref 5–15)
BUN: 13 mg/dL (ref 8–23)
CHLORIDE: 109 mmol/L (ref 98–111)
CO2: 27 mmol/L (ref 22–32)
Calcium: 9.3 mg/dL (ref 8.9–10.3)
Creatinine, Ser: 0.74 mg/dL (ref 0.44–1.00)
Glucose, Bld: 101 mg/dL — ABNORMAL HIGH (ref 70–99)
POTASSIUM: 4.8 mmol/L (ref 3.5–5.1)
Sodium: 140 mmol/L (ref 135–145)

## 2018-05-29 LAB — CBC
HEMATOCRIT: 43.9 % (ref 36.0–46.0)
HEMOGLOBIN: 13.9 g/dL (ref 12.0–15.0)
MCH: 29.1 pg (ref 26.0–34.0)
MCHC: 31.7 g/dL (ref 30.0–36.0)
MCV: 91.8 fL (ref 80.0–100.0)
NRBC: 0 % (ref 0.0–0.2)
Platelets: 134 10*3/uL — ABNORMAL LOW (ref 150–400)
RBC: 4.78 MIL/uL (ref 3.87–5.11)
RDW: 13.7 % (ref 11.5–15.5)
WBC: 5.4 10*3/uL (ref 4.0–10.5)

## 2018-05-29 MED ORDER — ENOXAPARIN SODIUM 40 MG/0.4ML ~~LOC~~ SOLN
40.0000 mg | SUBCUTANEOUS | Status: DC
  Administered 2018-05-29: 40 mg via SUBCUTANEOUS
  Filled 2018-05-29: qty 0.4

## 2018-05-29 MED ORDER — ROSUVASTATIN CALCIUM 5 MG PO TABS
10.0000 mg | ORAL_TABLET | Freq: Every day | ORAL | Status: DC
  Administered 2018-05-29: 10 mg via ORAL
  Filled 2018-05-29: qty 2

## 2018-05-29 MED ORDER — ALBUTEROL SULFATE (2.5 MG/3ML) 0.083% IN NEBU: 2.5000 mg | INHALATION_SOLUTION | Freq: Four times a day (QID) | RESPIRATORY_TRACT | Status: DC | PRN

## 2018-05-29 MED ORDER — IBUPROFEN 400 MG PO TABS
400.0000 mg | ORAL_TABLET | Freq: Four times a day (QID) | ORAL | Status: DC | PRN
  Administered 2018-05-29: 400 mg via ORAL
  Filled 2018-05-29 (×2): qty 1

## 2018-05-29 MED ORDER — BUPROPION HCL ER (XL) 150 MG PO TB24
300.0000 mg | ORAL_TABLET | Freq: Every day | ORAL | Status: DC
  Administered 2018-05-29 – 2018-05-30 (×2): 300 mg via ORAL
  Filled 2018-05-29 (×2): qty 2

## 2018-05-29 MED ORDER — UMECLIDINIUM BROMIDE 62.5 MCG/INH IN AEPB
1.0000 | INHALATION_SPRAY | Freq: Every day | RESPIRATORY_TRACT | Status: DC
  Administered 2018-05-30: 1 via RESPIRATORY_TRACT
  Filled 2018-05-29: qty 7

## 2018-05-29 MED ORDER — ASPIRIN EC 81 MG PO TBEC
81.0000 mg | DELAYED_RELEASE_TABLET | Freq: Every day | ORAL | Status: DC
  Administered 2018-05-30: 81 mg via ORAL
  Filled 2018-05-29: qty 1

## 2018-05-29 MED ORDER — GUAIFENESIN ER 600 MG PO TB12: 1200.0000 mg | ORAL_TABLET | Freq: Two times a day (BID) | ORAL | Status: DC | PRN

## 2018-05-29 MED ORDER — ONDANSETRON HCL 4 MG/2ML IJ SOLN: 4.0000 mg | Freq: Four times a day (QID) | INTRAMUSCULAR | Status: DC | PRN

## 2018-05-29 MED ORDER — ACETAMINOPHEN 325 MG PO TABS: 650.0000 mg | ORAL_TABLET | ORAL | Status: DC | PRN

## 2018-05-29 MED ORDER — FLUTICASONE FUROATE-VILANTEROL 200-25 MCG/INH IN AEPB
1.0000 | INHALATION_SPRAY | Freq: Every day | RESPIRATORY_TRACT | Status: DC
  Administered 2018-05-30: 1 via RESPIRATORY_TRACT
  Filled 2018-05-29: qty 28

## 2018-05-29 MED ORDER — MORPHINE SULFATE (PF) 2 MG/ML IV SOLN: 2.0000 mg | INTRAVENOUS | Status: DC | PRN

## 2018-05-29 MED ORDER — NITROGLYCERIN 0.4 MG SL SUBL
SUBLINGUAL_TABLET | SUBLINGUAL | Status: AC
  Administered 2018-05-29: 0.4 mg
  Filled 2018-05-29: qty 1

## 2018-05-29 MED ORDER — SODIUM CHLORIDE 0.9% FLUSH
3.0000 mL | Freq: Once | INTRAVENOUS | Status: AC
  Administered 2018-05-29: 3 mL via INTRAVENOUS

## 2018-05-29 NOTE — ED Notes (Signed)
Report to Elizabeth,RN

## 2018-05-29 NOTE — ED Notes (Signed)
ED TO INPATIENT HANDOFF REPORT  ED Nurse Name and Phone #:  Kathlee Nations [phone]  S Name/Age/Gender Katrina Campbell 63 y.o. female Room/Bed: H020C/H020C  Code Status   Code Status: Prior  Home/SNF/Other Home Patient oriented to: self, place, time and situation Is this baseline? Yes   Triage Complete: Triage complete  Chief Complaint Chest Pain; L Arm Pain; HTN (159/99)  Triage Note Pt presents for evaluation of chest pain with radiation to L arm that started 2 hours ago. Reports hx of similar last year that she was admitted for. Pt reports hypertension.    Allergies No Known Allergies  Level of Care/Admitting Diagnosis ED Disposition    ED Disposition Condition Comment   Admit  Hospital Area: [facility] [[id number]]  Level of Care: Cardiac Telemetry [103]  I expect the patient will be discharged within 24 hours: Yes  LOW acuity---Tx typically complete <24 hrs---ACUTE conditions typically can be evaluated <24 hours---LABS likely to return to acceptable levels <24 hours---IS near functional baseline---EXPECTED to return to current living arrangement---NOT newly hypoxic: Meets criteria for 5C-Observation unit  Diagnosis: Chest pain [128786]  Admitting Physician: Barb Merino [[id number]]  Attending Physician: Barb Merino [[id number]]  PT Class (Do Not Modify): Observation [104]  PT Acc Code (Do Not Modify): Observation [10022]       B Medical/Surgery History Past Medical History:  Diagnosis Date  . Anxiety   . COPD (chronic obstructive pulmonary disease) ([place])   . Depression   . Hyperlipidemia   . Labile hypertension    Past Surgical History:  Procedure Laterality Date  . LEFT HEART CATH AND CORONARY ANGIOGRAPHY N/A 11/07/2017   Procedure: LEFT HEART CATH AND CORONARY ANGIOGRAPHY;  Surgeon: Nelva Bush, MD;  Location: [facility];  Service: Cardiovascular;  Laterality: N/A;  . NASAL SINUS SURGERY  1986     A IV Location/Drains/Wounds Patient  Lines/Drains/Airways Status   Active Line/Drains/Airways    Name:   Placement date:   Placement time:   Site:   Days:   Peripheral IV 05/29/18 Left;Distal Forearm   05/29/18    1343    Forearm   less than 1          Intake/Output Last 24 hours  Intake/Output Summary (Last 24 hours) at 05/29/2018 1636 Last data filed at 05/29/2018 1343 Gross per 24 hour  Intake 3 ml  Output -  Net 3 ml    Labs/Imaging Results for orders placed or performed during the hospital encounter of 05/29/18 (from the past 48 hour(s))  Basic metabolic panel     Status: Abnormal   Collection Time: 05/29/18 12:32 PM  Result Value Ref Range   Sodium 140 135 - 145 mmol/L   Potassium 4.8 3.5 - 5.1 mmol/L   Chloride 109 98 - 111 mmol/L   CO2 27 22 - 32 mmol/L   Glucose, Bld 101 (H) 70 - 99 mg/dL   BUN 13 8 - 23 mg/dL   Creatinine, Ser 0.74 0.44 - 1.00 mg/dL   Calcium 9.3 8.9 - 10.3 mg/dL   GFR calc non Af Amer >60 >60 mL/min   GFR calc Af Amer >60 >60 mL/min   Anion gap 4 (L) 5 - 15    Comment: Performed at [facility], [place] [address] [place], [place] [address]  CBC     Status: Abnormal   Collection Time: 05/29/18 12:32 PM  Result Value Ref Range   WBC 5.4 4.0 - 10.5 K/uL   RBC 4.78  3.87 - 5.11 MIL/uL   Hemoglobin 13.9 12.0 - 15.0 g/dL   HCT 43.9 36.0 - 46.0 %   MCV 91.8 80.0 - 100.0 fL   MCH 29.1 26.0 - 34.0 pg   MCHC 31.7 30.0 - 36.0 g/dL   RDW 13.7 11.5 - 15.5 %   Platelets 134 (L) 150 - 400 K/uL    Comment: REPEATED TO VERIFY   nRBC 0.0 0.0 - 0.2 %    Comment: Performed at [facility], [place] [address], [place], [place] [address]  I-stat troponin, ED     Status: None   Collection Time: 05/29/18  1:04 PM  Result Value Ref Range   Troponin i, poc 0.01 0.00 - 0.08 ng/mL   Comment 3            Comment: Due to the release kinetics of cTnI, a negative result within the first hours of the onset of symptoms does not rule out myocardial infarction with certainty. If myocardial  infarction is still suspected, repeat the test at appropriate intervals.    Dg Chest 2 View  Result Date: 05/29/2018 CLINICAL DATA:  Acute onset of burning chest pain that began this morning. EXAM: CHEST - 2 VIEW COMPARISON:  CT chest 02/22/2018 and earlier. Chest x-ray 02/13/2018 and earlier. FINDINGS: Cardiac silhouette normal in size, unchanged. Thoracic aorta mildly atherosclerotic, unchanged. Hilar and mediastinal contours otherwise unremarkable. Hyperinflation, emphysematous changes throughout both lungs and mild central peribronchial thickening, unchanged. Lungs otherwise clear. No localized airspace consolidation. No pleural effusions. No pneumothorax. Normal pulmonary vascularity. Visualized bony thorax intact. IMPRESSION: 1. No acute cardiopulmonary disease. 2. Aortic Atherosclerosis (ICD10-I70.0) and Emphysema (ICD10-J43.9). Electronically Signed   By: Evangeline Dakin M.D.   On: 05/29/2018 13:15    Pending Labs Unresulted Labs (From admission, onward)    Start     Ordered   Signed and Held  Troponin I - Now Then Q6H  Now then every 6 hours,   R     Signed and Held          Vitals/Pain Today's Vitals   05/29/18 1519 05/29/18 1524 05/29/18 1545 05/29/18 1631  BP: 127/78 127/72 132/67   Pulse: 70 65 61   Resp: 16 15 17    Temp:      TempSrc:      SpO2: 92% 93% 96%   Weight:      Height:      PainSc:    3     Isolation Precautions No active isolations  Medications Medications  ibuprofen (ADVIL,MOTRIN) tablet 400 mg (has no administration in time range)  sodium chloride flush (NS) 0.9 % injection 3 mL (3 mLs Intravenous Given 05/29/18 1343)  nitroGLYCERIN (NITROSTAT) 0.4 MG SL tablet (0.4 mg  Given 05/29/18 1516)    Mobility walks Low fall risk   Focused Assessments Cardiac Assessment Handoff:  Cardiac Rhythm: Normal sinus rhythm Lab Results  Component Value Date   TROPONINI 0.04 ([place]) 11/07/2017   Lab Results  Component Value Date   DDIMER 0.36 02/13/2018    Does the Patient currently have chest pain? No     R Recommendations: See Admitting Provider Note  Report given to:   Additional Notes:  I-stat Troponin 0.01

## 2018-05-29 NOTE — Telephone Encounter (Signed)
Pt's husband called and states that the pt's left arm is burning with some pain and her BP is 159/99. Pt states that she was having CP but took 3 ASA and it went away and she was having SOB but used her inhaler and that helped but it did not go away. With those symptoms and pt's past medical HX I recommended that he take her to the ER for evaluation. Pt's husband agreed and verbalized understanding.

## 2018-05-29 NOTE — ED Provider Notes (Signed)
[facility] EMERGENCY DEPARTMENT Provider Note   CSN: [id number] Arrival date & time: 05/29/18  1224    History   Chief Complaint Chief Complaint  Patient presents with  . Chest Pain    HPI 1. Katrina Campbell is a 63 y.o. female with a PMH HLD, COPD, HTN, and Anxiety presenting with intermittent left sided chest pain radiating to left arm onset at 11am today. Patient reports she had similar symptoms last year. Patient describes pain as a burning and nothing makes symptoms worse. Patient reports ASA helps her symptoms and she took three 81mg  ASA this morning. Patient denies using hormone replacement, recent travel, recent surgery, leg edema/pain, or hx of DVT/PE. Patient denies recent tobacco use (quit 5 years ago) or drug use. Patient reports daily alcohol use with a glass of wine at night. Dr. Meda Coffee is patient's cardiologist. Last Cath was on 11/2017 and it revealed eccentric calcification with mild stenosis of the ostial LAD (~20%), but no angiographically significant coronary artery disease. Left ventricular systolic function and filling pressure were normal.      HPI  Past Medical History:  Diagnosis Date  . Anxiety   . COPD (chronic obstructive pulmonary disease) ([place])   . Depression   . Hyperlipidemia   . Labile hypertension     Patient Active Problem List   Diagnosis Date Noted  . Pulmonary nodules 02/06/2018  . CAD (coronary artery disease) 11/21/2017  . Swelling of joint of upper arm, right 11/14/2017  . Swelling of extremity, right 11/14/2017  . Redness of forearm 11/14/2017  . Chest pain 11/07/2017  . Acute coronary syndrome ([place])   . COPD with acute exacerbation ([place]) 03/09/2017  . Dysthymia 03/09/2017  . DOE (dyspnea on exertion) 06/23/2016  . Vitamin D deficiency 12/30/2015  . Thoracic aortic aneurysm ([place]) 12/30/2015  . Atherosclerosis of aorta ([place]) 12/30/2015  . Osteopenia 12/30/2015  . Abnormal CT of the chest 02/10/2015  . Depression     . Anxiety   . COPD (chronic obstructive pulmonary disease) ([place])   . Hyperlipidemia   . Labile hypertension     Past Surgical History:  Procedure Laterality Date  . LEFT HEART CATH AND CORONARY ANGIOGRAPHY N/A 11/07/2017   Procedure: LEFT HEART CATH AND CORONARY ANGIOGRAPHY;  Surgeon: Nelva Bush, MD;  Location: [facility];  Service: Cardiovascular;  Laterality: N/A;  . NASAL SINUS SURGERY  1986     OB History   No obstetric history on file.      Home Medications    Prior to Admission medications   Medication Sig Start Date End Date Taking? Authorizing Provider  albuterol (PROAIR HFA) 108 (90 BASE) MCG/ACT inhaler Inhale 2 puffs into the lungs every 6 (six) hours as needed for wheezing or shortness of breath. 12/10/14   [name], PA-C  aspirin EC 81 MG tablet Take 81 mg by mouth daily.    [provider]  azithromycin (ZITHROMAX) 250 MG tablet 2 tabs poqday1, 1 tab poqday 2-5 02/16/18   Susy Frizzle, MD  buPROPion (WELLBUTRIN XL) 300 MG 24 hr tablet TAKE 1 TABLET DAILY 01/29/18   Alycia Rossetti, MD  Calcium Carb-Cholecalciferol (CALCIUM-VITAMIN D) 500-400 MG-UNIT TABS 1 a day 12/28/16   Alycia Rossetti, MD  cholecalciferol (VITAMIN D) 1000 units tablet Take 2,000 Units by mouth daily.    [provider]  fluticasone (FLONASE) 50 MCG/ACT nasal spray Place 2 sprays into both nostrils daily. 04/25/18   Alycia Rossetti, MD  Fluticasone-Salmeterol (ADVAIR DISKUS) 250-50 MCG/DOSE AEPB USE 1 INHALATION TWICE A DAY 02/06/18   Noralee Space, MD  guaiFENesin (MUCINEX) 600 MG 12 hr tablet Take 1,200 mg by mouth 2 (two) times daily as needed for to loosen phlegm.     [provider]  HYDROcodone-homatropine (HYCODAN) 5-1.5 MG/5ML syrup Take 5 mLs by mouth every 8 (eight) hours as needed for cough. Patient not taking: Reported on 02/16/2018 02/13/18   Susy Frizzle, MD  ibuprofen (ADVIL) 200 MG tablet Take 400 mg by mouth every 8 (eight)  hours as needed for fever.    [provider]  INCRUSE ELLIPTA 62.5 MCG/INH AEPB USE 1 INHALATION DAILY (DISCARD 6 WEEKS AFTER OPENING FOIL TRAY) 04/24/18   Noralee Space, MD  MAGNESIUM PO Take 1 tablet by mouth daily.     [provider]  Omega-3 Fatty Acids (FISH OIL PO) Take 500 mg by mouth daily.    [provider]  rosuvastatin (CRESTOR) 10 MG tablet Take 1 tablet (10 mg total) by mouth daily at 6 PM. 02/14/18   Dorothy Spark, MD  vitamin C (ASCORBIC ACID) 500 MG tablet Take 500 mg by mouth every morning.    [provider]    Family History Family History  Problem Relation Age of Onset  . Heart attack Mother   . Emphysema Mother   . Congestive Heart Failure Mother   . Heart attack Father   . Congestive Heart Failure Father   . Asthma Other   . Lung disease Sister   . Lung disease Brother   . Colon cancer Neg Hx     Social History Social History   Tobacco Use  . Smoking status: Former Smoker    Packs/day: 1.00    Years: 40.00    Pack years: 40.00    Types: Cigarettes    Last attempt to quit: 11/09/2012    Years since quitting: 5.5  . Smokeless tobacco: Never Used  . Tobacco comment: vapor  Substance Use Topics  . Alcohol use: Yes    Alcohol/week: 24.0 standard drinks    Types: 24 Cans of beer per week  . Drug use: No     Allergies   Patient has no known allergies.   Review of Systems Review of Systems  Constitutional: Negative for activity change, appetite change, chills, diaphoresis, fatigue, fever and unexpected weight change.  HENT: Negative for congestion and rhinorrhea.   Respiratory: Positive for shortness of breath. Negative for cough, chest tightness and wheezing.   Cardiovascular: Positive for chest pain. Negative for palpitations and leg swelling.  Gastrointestinal: Positive for nausea. Negative for abdominal pain and vomiting.  Endocrine: Negative for cold intolerance and heat intolerance.  Musculoskeletal:  Negative for back pain.  Skin: Negative for rash.  Allergic/Immunologic: Negative for immunocompromised state.  Neurological: Positive for light-headedness. Negative for dizziness, syncope and weakness.  Psychiatric/Behavioral: Negative for agitation and behavioral problems. The patient is not nervous/anxious.      Physical Exam Updated Vital Signs BP 127/72   Pulse 65   Temp 97.7 F (36.5 C) (Oral)   Resp 15   Ht 5\' 4"  (1.626 m)   Wt 59 kg   SpO2 93%   BMI 22.31 kg/m   Physical Exam Vitals signs and nursing note reviewed.  Constitutional:      General: She is not in acute distress.    Appearance: She is well-developed. She is not diaphoretic.  HENT:     Head: Normocephalic  and atraumatic.  Neck:     Musculoskeletal: Normal range of motion and neck supple.     Vascular: No JVD.  Cardiovascular:     Rate and Rhythm: Normal rate and regular rhythm.     Pulses: Normal pulses.          Radial pulses are 2+ on the right side and 2+ on the left side.       Dorsalis pedis pulses are 2+ on the right side and 2+ on the left side.     Heart sounds: Normal heart sounds. No murmur. No friction rub. No gallop.   Pulmonary:     Effort: Pulmonary effort is normal. No respiratory distress.     Breath sounds: Normal breath sounds. No wheezing or rales.  Chest:     Chest wall: No tenderness.  Abdominal:     Palpations: Abdomen is soft.     Tenderness: There is no abdominal tenderness.  Musculoskeletal: Normal range of motion.     Right lower leg: She exhibits no tenderness. No edema.     Left lower leg: She exhibits no tenderness. No edema.  Skin:    Capillary Refill: Capillary refill takes less than 2 seconds.     Coloration: Skin is not pale.     Findings: No rash.  Neurological:     Mental Status: She is alert and oriented to person, place, and time.      ED Treatments / Results  Labs (all labs ordered are listed, but only abnormal results are displayed) Labs Reviewed    BASIC METABOLIC PANEL - Abnormal; Notable for the following components:      Result Value   Glucose, Bld 101 (*)    Anion gap 4 (*)    All other components within normal limits  CBC - Abnormal; Notable for the following components:   Platelets 134 (*)    All other components within normal limits  I-STAT TROPONIN, ED    EKG EKG Interpretation  Date/Time:  Tuesday May 29 2018 12:30:43 EST Ventricular Rate:  60 PR Interval:  148 QRS Duration: 72 QT Interval:  442 QTC Calculation: 442 R Axis:   71 Text Interpretation:  Normal sinus rhythm Septal infarct , age undetermined Abnormal ECG since last tracing no significant change Confirmed by Malvin Johns [phone]) on 05/29/2018 1:33:27 PM   Radiology Dg Chest 2 View  Result Date: 05/29/2018 CLINICAL DATA:  Acute onset of burning chest pain that began this morning. EXAM: CHEST - 2 VIEW COMPARISON:  CT chest 02/22/2018 and earlier. Chest x-ray 02/13/2018 and earlier. FINDINGS: Cardiac silhouette normal in size, unchanged. Thoracic aorta mildly atherosclerotic, unchanged. Hilar and mediastinal contours otherwise unremarkable. Hyperinflation, emphysematous changes throughout both lungs and mild central peribronchial thickening, unchanged. Lungs otherwise clear. No localized airspace consolidation. No pleural effusions. No pneumothorax. Normal pulmonary vascularity. Visualized bony thorax intact. IMPRESSION: 1. No acute cardiopulmonary disease. 2. Aortic Atherosclerosis (ICD10-I70.0) and Emphysema (ICD10-J43.9). Electronically Signed   By: Evangeline Dakin M.D.   On: 05/29/2018 13:15    Procedures Procedures (including critical care time)  Medications Ordered in ED Medications  sodium chloride flush (NS) 0.9 % injection 3 mL (3 mLs Intravenous Given 05/29/18 1343)  nitroGLYCERIN (NITROSTAT) 0.4 MG SL tablet (0.4 mg  Given 05/29/18 1516)     Initial Impression / Assessment and Plan / ED Course  I have reviewed the triage vital signs  and the nursing notes.  Pertinent labs & imaging results that were available during my care  of the patient were reviewed by me and considered in my medical decision making (see chart for details).  Clinical Course as of May 29 1541  Tue May 29, 2018  1342 No acute cardiopulmonary disease noted on CXR.  DG Chest 2 View [AH]    Clinical Course User Index [AH] Arville Lime, PA-C      Concern for cardiac etiology of Chest Pain due to history and risk factors. No acute abnormalities found on EKG and first round of cardiac enzymes negative. Patient had ASA prior to arrival. Provided nitroglycerin with improvement of symptoms. Heart score is a 4. This case was discussed with Dr. Tamera Punt and agrees with plan to admit.  Consulted hospitalist. Hospitalist agreed to admit patient.   Final Clinical Impressions(s) / ED Diagnoses   Final diagnoses:  Left-sided chest pain    ED Discharge Orders    None       Arville Lime, PA-C 05/29/18 1553    Malvin Johns, MD 05/29/18 1555

## 2018-05-29 NOTE — ED Notes (Addendum)
Pt stats chest pain is back to 3/10 but now c/o headache. Will inform provider

## 2018-05-29 NOTE — ED Notes (Signed)
RN informed Pt can receive visitor

## 2018-05-29 NOTE — ED Notes (Signed)
Chest pain started this am at 10 radiating to left arm, nauseated, called medical dr and took 3 baby ASA prior to arrival.

## 2018-05-29 NOTE — ED Triage Notes (Signed)
Pt presents for evaluation of chest pain with radiation to L arm that started 2 hours ago. Reports hx of similar last year that she was admitted for. Pt reports hypertension.

## 2018-05-29 NOTE — H&P (Signed)
History and Physical    [name] OZD:[id number] DOB: Sep 16, 1955 DOA: 05/29/2018  PCP: Alycia Rossetti, MD  Patient coming from: Home.  I have personally briefly reviewed patient's old medical records available.   Chief Complaint: Chest pain.  HPI: Katrina Campbell is a 63 y.o. female with medical history significant of anxiety depression, hypertension hyperlipidemia and COPD, negative cardiac cath on 11/07/2017 presenting to the emergency room with left arm pain and left-sided chest pain for 1 day.  According to the patient, all of a sudden she started having pain in her left arm all throughout, then it settled at left side of the chest, moderate in intensity, intermittent.  Her arm pain improved, however her chest pain remained at 3 out of 10 all the time.  Denies any cough or wheezing.  Denies any nausea vomiting.  She started having headache after nitroglycerin.  No recent flulike symptoms.  No history of anginal pain.  She has COPD and on nebulizers but denies any exacerbations, wheezing. ED Course: Hemodynamically stable.  Electrolytes normal.  Twelve-lead EKG nonischemic.  Point-of-care troponin nonischemic.  Received nitroglycerin and aspirin in route to ER.  Review of Systems: As per HPI otherwise 10 point review of systems negative.    Past Medical History:  Diagnosis Date  . Anxiety   . COPD (chronic obstructive pulmonary disease) ([place])   . Depression   . Hyperlipidemia   . Labile hypertension     Past Surgical History:  Procedure Laterality Date  . LEFT HEART CATH AND CORONARY ANGIOGRAPHY N/A 11/07/2017   Procedure: LEFT HEART CATH AND CORONARY ANGIOGRAPHY;  Surgeon: Nelva Bush, MD;  Location: [facility];  Service: Cardiovascular;  Laterality: N/A;  . [place]     reports that she quit smoking about 5 years ago. Her smoking use included cigarettes. She has a 40.00 pack-year smoking history. She has never used smokeless tobacco. She reports current  alcohol use of about 24.0 standard drinks of alcohol per week. She reports that she does not use drugs.  No Known Allergies  Family History  Problem Relation Age of Onset  . Heart attack Mother   . Emphysema Mother   . Congestive Heart Failure Mother   . Heart attack Father   . Congestive Heart Failure Father   . Asthma Other   . Lung disease Sister   . Lung disease Brother   . Colon cancer Neg Hx      Prior to Admission medications   Medication Sig Start Date End Date Taking? Authorizing Provider  albuterol (PROAIR HFA) 108 (90 BASE) MCG/ACT inhaler Inhale 2 puffs into the lungs every 6 (six) hours as needed for wheezing or shortness of breath. 12/10/14  Yes [name], PA-C  aspirin EC 81 MG tablet Take 81 mg by mouth daily.   Yes [provider]  buPROPion (WELLBUTRIN XL) 300 MG 24 hr tablet TAKE 1 TABLET DAILY Patient taking differently: Take 300 mg by mouth daily.  01/29/18  Yes [place], Modena Nunnery, MD  Calcium Carb-Cholecalciferol (CALCIUM-VITAMIN D) 500-400 MG-UNIT TABS 1 a day 12/28/16  Yes [place], Modena Nunnery, MD  fluticasone [facility]) 50 MCG/ACT nasal spray Place 2 sprays into both nostrils daily. Patient taking differently: Place 2 sprays into both nostrils daily as needed for rhinitis.  04/25/18  Yes [place], Modena Nunnery, MD  Fluticasone-Salmeterol (ADVAIR DISKUS) 250-50 MCG/DOSE AEPB USE 1 INHALATION TWICE A DAY Patient taking differently: Inhale 1 puff into the lungs 2 (two) times  daily.  02/06/18  Yes Noralee Space, MD  guaiFENesin (MUCINEX) 600 MG 12 hr tablet Take 1,200 mg by mouth 2 (two) times daily as needed for to loosen phlegm.    Yes [provider]  ibuprofen (ADVIL) 200 MG tablet Take 400 mg by mouth every 8 (eight) hours as needed for fever.   Yes [provider]  INCRUSE ELLIPTA 62.5 MCG/INH AEPB USE 1 INHALATION DAILY (DISCARD 6 WEEKS AFTER OPENING FOIL TRAY) Patient taking differently: Inhale 1 puff into the lungs daily.  04/24/18  Yes  Noralee Space, MD  MAGNESIUM PO Take 1 tablet by mouth daily.    Yes [provider]  Omega-3 Fatty Acids (FISH OIL PO) Take 500 mg by mouth daily.   Yes [provider]  rosuvastatin (CRESTOR) 10 MG tablet Take 1 tablet (10 mg total) by mouth daily at 6 PM. 02/14/18  Yes Dorothy Spark, MD  vitamin C (ASCORBIC ACID) 500 MG tablet Take 500 mg by mouth every morning.   Yes [provider]  HYDROcodone-homatropine (HYCODAN) 5-1.5 MG/5ML syrup Take 5 mLs by mouth every 8 (eight) hours as needed for cough. Patient not taking: Reported on 02/16/2018 02/13/18   Susy Frizzle, MD    Physical Exam: Vitals:   05/29/18 1515 05/29/18 1519 05/29/18 1524 05/29/18 1545  BP: (!) 146/73 127/78 127/72 132/67  Pulse: (!) 56 70 65 61  Resp: 14 16 15 17   Temp:      TempSrc:      SpO2: 97% 92% 93% 96%  Weight:      Height:        Constitutional: NAD, calm, comfortable Vitals:   05/29/18 1515 05/29/18 1519 05/29/18 1524 05/29/18 1545  BP: (!) 146/73 127/78 127/72 132/67  Pulse: (!) 56 70 65 61  Resp: 14 16 15 17   Temp:      TempSrc:      SpO2: 97% 92% 93% 96%  Weight:      Height:       Eyes: PERRL, lids and conjunctivae normal ENMT: Mucous membranes are moist. Posterior pharynx clear of any exudate or lesions.Normal dentition.  Neck: normal, supple, no masses, no thyromegaly Respiratory: clear to auscultation bilaterally, no wheezing, no crackles. Normal respiratory effort. No accessory muscle use.  Cardiovascular: Regular rate and rhythm, no murmurs / rubs / gallops. No extremity edema. 2+ pedal pulses. No carotid bruits.  No reproducible chest pain. Abdomen: no tenderness, no masses palpated. No hepatosplenomegaly. Bowel sounds positive.  Musculoskeletal: no clubbing / cyanosis. No joint deformity upper and lower extremities. Good ROM, no contractures. Normal muscle tone.  Skin: no rashes, lesions, ulcers. No induration Neurologic: CN 2-12 grossly intact.  Sensation intact, DTR normal. Strength 5/5 in all 4.  Psychiatric: Normal judgment and insight. Alert and oriented x 3.  Looking anxious.    Labs on Admission: I have personally reviewed following labs and imaging studies  CBC: Recent Labs  Lab 05/29/18 1232  WBC 5.4  HGB 13.9  HCT 43.9  MCV 91.8  PLT 767*   Basic Metabolic Panel: Recent Labs  Lab 05/29/18 1232  NA 140  K 4.8  CL 109  CO2 27  GLUCOSE 101*  BUN 13  CREATININE 0.74  CALCIUM 9.3   GFR: Estimated Creatinine Clearance: 61.3 mL/min (by C-G formula based on SCr of 0.74 mg/dL). Liver Function Tests: No results for input(s): AST, ALT, ALKPHOS, BILITOT, PROT, ALBUMIN in the last 168 hours. No results for input(s): LIPASE,  AMYLASE in the last 168 hours. No results for input(s): AMMONIA in the last 168 hours. Coagulation Profile: No results for input(s): INR, PROTIME in the last 168 hours. Cardiac Enzymes: No results for input(s): CKTOTAL, CKMB, CKMBINDEX, TROPONINI in the last 168 hours. BNP (last 3 results) No results for input(s): PROBNP in the last 8760 hours. HbA1C: No results for input(s): HGBA1C in the last 72 hours. CBG: No results for input(s): GLUCAP in the last 168 hours. Lipid Profile: No results for input(s): CHOL, HDL, LDLCALC, TRIG, CHOLHDL, LDLDIRECT in the last 72 hours. Thyroid Function Tests: No results for input(s): TSH, T4TOTAL, FREET4, T3FREE, THYROIDAB in the last 72 hours. Anemia Panel: No results for input(s): VITAMINB12, FOLATE, FERRITIN, TIBC, IRON, RETICCTPCT in the last 72 hours. Urine analysis:    Component Value Date/Time   COLORURINE YELLOW 12/30/2015 1536   APPEARANCEUR CLEAR 12/30/2015 1536   LABSPEC 1.017 12/30/2015 1536   PHURINE 7.0 12/30/2015 1536   GLUCOSEU NEGATIVE 12/30/2015 1536   HGBUR NEGATIVE 12/30/2015 1536   BILIRUBINUR NEGATIVE 12/30/2015 1536   KETONESUR NEGATIVE 12/30/2015 1536   PROTEINUR NEGATIVE 12/30/2015 1536   UROBILINOGEN 0.2 07/17/2013 1024     NITRITE NEGATIVE 12/30/2015 1536   LEUKOCYTESUR NEGATIVE 12/30/2015 1536    Radiological Exams on Admission: Dg Chest 2 View  Result Date: 05/29/2018 CLINICAL DATA:  Acute onset of burning chest pain that began this morning. EXAM: CHEST - 2 VIEW COMPARISON:  CT chest 02/22/2018 and earlier. Chest x-ray 02/13/2018 and earlier. FINDINGS: Cardiac silhouette normal in size, unchanged. Thoracic aorta mildly atherosclerotic, unchanged. Hilar and mediastinal contours otherwise unremarkable. Hyperinflation, emphysematous changes throughout both lungs and mild central peribronchial thickening, unchanged. Lungs otherwise clear. No localized airspace consolidation. No pleural effusions. No pneumothorax. Normal pulmonary vascularity. Visualized bony thorax intact. IMPRESSION: 1. No acute cardiopulmonary disease. 2. Aortic Atherosclerosis (ICD10-I70.0) and Emphysema (ICD10-J43.9). Electronically Signed   By: Evangeline Dakin M.D.   On: 05/29/2018 13:15    EKG: Independently reviewed. Normal sinus rhythm.  No acute ST-T wave changes.  Comparable to previous EKGs.  Assessment/Plan Principal Problem:   Ischemic chest pain (HCC) Active Problems:   Depression   COPD (chronic obstructive pulmonary disease) (HCC)   Hyperlipidemia   CAD (coronary artery disease)   Chest pain     1.  Chest pain: Probably atypical chest pain. We will admit patient to the telemetry unit given severity of symptoms. Currently chest pain improving. Cycle EKG and troponins. Supplemental oxygen to keep saturations more than 90%. Aspirin , beta-blockers and a statin to continue. Nitroglycerin and Morphine for severe and recurrent pain. Rule out acute coronary syndrome, if ruled out she will follow-up with her cardiologist.  2.  Hypertension: Stable.  Resume home medications.  3.  Hyperlipidemia: Tolerating Crestor.  She will continue.  4.  Anxiety depression: On Wellbutrin.  Controlled symptoms.  5.  COPD without  exacerbation: Patient is on Incruse, Advair and albuterol that she will continue.    DVT prophylaxis: Lovenox subcu. Code Status: Full code. Family Communication: No family at bedside. Disposition Plan: Home, anticipate tomorrow Consults called: None Admission status: Observation.   Barb Merino MD [facility] Pager [phone]  If 7PM-7AM, please contact night-coverage [email or web address] Password [facility]  05/29/2018, 4:28 PM

## 2018-05-29 NOTE — ED Notes (Signed)
Pt states chest pain improved.

## 2018-05-30 DIAGNOSIS — I209 Angina pectoris, unspecified: Secondary | ICD-10-CM

## 2018-05-30 LAB — TROPONIN I: Troponin I: <0.03 ng/mL (ref <0.03)

## 2018-05-30 NOTE — Discharge Summary (Addendum)
Physician Discharge Summary  Calvina Liptak EVO:[id number] DOB: Oct 03, 1955 DOA: 05/29/2018  PCP: Alycia Rossetti, MD  Admit date: 05/29/2018 Discharge date: 05/30/2018  Admitted From: home Discharge disposition: home   Recommendations for Outpatient Follow-Up:   1. Consider PPI   Discharge Diagnosis:   Principal Problem:   Ischemic chest pain ([place]) Active Problems:   Depression   COPD (chronic obstructive pulmonary disease) (H[facility])   Hyperlipidemia   CAD (coronary artery disease)   Chest pain    Discharge Condition: Improved.  Diet recommendation: Low sodium, heart healthy  Wound care: None.  Code status: Full.   History of Present Illness:   Katrina Campbell is a 63 y.o. female with medical history significant of anxiety depression, hypertension hyperlipidemia and COPD, negative cardiac cath on 11/07/2017 presenting to the emergency room with left arm pain and left-sided chest pain for 1 day.  According to the patient, all of a sudden she started having pain in her left arm all throughout, then it settled at left side of the chest, moderate in intensity, intermittent.  Her arm pain improved, however her chest pain remained at 3 out of 10 all the time.  Denies any cough or wheezing.  Denies any nausea vomiting.  She started having headache after nitroglycerin.  No recent flulike symptoms.  No history of anginal pain.  She has COPD and on nebulizers but denies any exacerbations, wheezing. ED Course: Hemodynamically stable.  Electrolytes normal.  Twelve-lead EKG nonischemic.  Point-of-care troponin nonischemic.  Received nitroglycerin and aspirin in route to ER.   Hospital Course by Problem:    Chest pain: Probably atypical chest pain. -? gerd vs herpes zoster beginning as se was having more "burning sensation down arm" -patient to contact PCP for treatment if she develops rash/lesions  -CE negative; ruled out she will follow-up with her cardiologist.  Hypertension:  Stable.  Resume home medications.   Hyperlipidemia: Tolerating Crestor.  She will continue.   Anxiety depression: On Wellbutrin.  Controlled symptoms.  COPD without exacerbation: Patient is on Incruse, Advair and albuterol that she will continue.    Medical Consultants:      Discharge Exam:   Vitals:   05/30/18 0919 05/30/18 1007  BP:  135/79  Pulse:  61  Resp:  18  Temp:  97.9 F (36.6 C)  SpO2: 98% 96%   Vitals:   05/30/18 0416 05/30/18 0914 05/30/18 0919 05/30/18 1007  BP: 125/66   135/79  Pulse: (!) 56   61  Resp: 18   18  Temp: (!) 97.4 F (36.3 C)   97.9 F (36.6 C)  TempSrc: Oral   Oral  SpO2: 98% 98% 98% 96%  Weight:      Height:        General exam: Appears calm and comfortable.   The results of significant diagnostics from this hospitalization (including imaging, microbiology, ancillary and laboratory) are listed below for reference.     Procedures and Diagnostic Studies:   Dg Chest 2 View  Result Date: 05/29/2018 CLINICAL DATA:  Acute onset of burning chest pain that began this morning. EXAM: CHEST - 2 VIEW COMPARISON:  CT chest 02/22/2018 and earlier. Chest x-ray 02/13/2018 and earlier. FINDINGS: Cardiac silhouette normal in size, unchanged. Thoracic aorta mildly atherosclerotic, unchanged. Hilar and mediastinal contours otherwise unremarkable. Hyperinflation, emphysematous changes throughout both lungs and mild central peribronchial thickening, unchanged. Lungs otherwise clear. No localized airspace consolidation. No pleural effusions. No pneumothorax. Normal pulmonary vascularity. Visualized bony thorax  intact. IMPRESSION: 1. No acute cardiopulmonary disease. 2. Aortic Atherosclerosis (ICD10-I70.0) and Emphysema (ICD10-J43.9). Electronically Signed   By: Evangeline Dakin M.D.   On: 05/29/2018 13:15     Labs:   Basic Metabolic Panel: Recent Labs  Lab 05/29/18 1232  NA 140  K 4.8  CL 109  CO2 27  GLUCOSE 101*  BUN 13  CREATININE 0.74    CALCIUM 9.3   GFR Estimated Creatinine Clearance: 61.3 mL/min (by C-G formula based on SCr of 0.74 mg/dL). Liver Function Tests: No results for input(s): AST, ALT, ALKPHOS, BILITOT, PROT, ALBUMIN in the last 168 hours. No results for input(s): LIPASE, AMYLASE in the last 168 hours. No results for input(s): AMMONIA in the last 168 hours. Coagulation profile No results for input(s): INR, PROTIME in the last 168 hours.  CBC: Recent Labs  Lab 05/29/18 1232  WBC 5.4  HGB 13.9  HCT 43.9  MCV 91.8  PLT 134*   Cardiac Enzymes: Recent Labs  Lab 05/29/18 1756 05/29/18 2238 05/30/18 0527  TROPONINI <0.03 <0.03 <0.03   BNP: Invalid input(s): POCBNP CBG: No results for input(s): GLUCAP in the last 168 hours. D-Dimer No results for input(s): DDIMER in the last 72 hours. Hgb A1c No results for input(s): HGBA1C in the last 72 hours. Lipid Profile No results for input(s): CHOL, HDL, LDLCALC, TRIG, CHOLHDL, LDLDIRECT in the last 72 hours. Thyroid function studies No results for input(s): TSH, T4TOTAL, T3FREE, THYROIDAB in the last 72 hours.  Invalid input(s): FREET3 Anemia work up No results for input(s): VITAMINB12, FOLATE, FERRITIN, TIBC, IRON, RETICCTPCT in the last 72 hours. Microbiology No results found for this or any previous visit (from the past 240 hour(s)).   Discharge Instructions:   Discharge Instructions    Diet - low sodium heart healthy   Complete by:  As directed    Increase activity slowly   Complete by:  As directed      Allergies as of 05/30/2018   No Known Allergies     Medication List    STOP taking these medications   HYDROcodone-homatropine 5-1.5 MG/5ML syrup Commonly known as:  HYCODAN     TAKE these medications   ADVIL 200 MG tablet Generic drug:  ibuprofen Take 400 mg by mouth every 8 (eight) hours as needed for fever.   albuterol 108 (90 Base) MCG/ACT inhaler Commonly known as:  PROAIR HFA Inhale 2 puffs into the lungs every 6  (six) hours as needed for wheezing or shortness of breath.   aspirin EC 81 MG tablet Take 81 mg by mouth daily.   buPROPion 300 MG 24 hr tablet Commonly known as:  WELLBUTRIN XL TAKE 1 TABLET DAILY   Calcium-Vitamin D 500-400 MG-UNIT Tabs 1 a day   FISH OIL PO Take 500 mg by mouth daily.   fluticasone 50 MCG/ACT nasal spray Commonly known as:  FLONASE Place 2 sprays into both nostrils daily. What changed:    when to take this  reasons to take this   Fluticasone-Salmeterol 250-50 MCG/DOSE Aepb Commonly known as:  ADVAIR DISKUS USE 1 INHALATION TWICE A DAY What changed:    how much to take  how to take this  when to take this  additional instructions   guaiFENesin 600 MG 12 hr tablet Commonly known as:  MUCINEX Take 1,200 mg by mouth 2 (two) times daily as needed for to loosen phlegm.   INCRUSE ELLIPTA 62.5 MCG/INH Aepb Generic drug:  umeclidinium bromide USE 1 INHALATION DAILY (DISCARD 6  WEEKS AFTER OPENING FOIL TRAY) What changed:  See the new instructions.   MAGNESIUM PO Take 1 tablet by mouth daily.   rosuvastatin 10 MG tablet Commonly known as:  CRESTOR Take 1 tablet (10 mg total) by mouth daily at 6 PM.   vitamin C 500 MG tablet Commonly known as:  ASCORBIC ACID Take 500 mg by mouth every morning.      Follow-up Information    [place], Modena Nunnery, MD Follow up.   Specialty:  Family Medicine Why:  B12 if not recently checked Contact information: [place] [address] [phone]        Dorothy Spark, MD .   Specialty:  Cardiology Contact information: [place] [address] [phone]            Time coordinating discharge: 25 min  Signed:  Geradine Girt DO  [facility] 05/30/2018, 12:16 PM

## 2018-07-12 ENCOUNTER — Telehealth: Payer: Self-pay | Admitting: Pulmonary Disease

## 2018-07-12 NOTE — Telephone Encounter (Signed)
Called and spoke with patient regarding an ROV with BQ Pt is former SN pt and he is recommending pt to be seen by BQ Advised due to the covid19 BQ is not available in office until after July 2020 Placed recall for 91mo for BQ scheduled to be called for July Advised pt to call our office sooner if cough worsens for televisit with NP Pt expressed and verbalized understanding Nothing further needed at this time.

## 2018-08-07 ENCOUNTER — Telehealth: Payer: Self-pay | Admitting: Pulmonary Disease

## 2018-08-07 NOTE — Telephone Encounter (Signed)
Spoke with patient. She stated that she has had a cough on and off since January. At times, she will cough so hard that she will cough up clear phlegm with small specks of blood in it. Denied any other symptoms. Patient wanted to be seen but did not want to come into the office due to covid. Advised her that I could get her scheduled for a televisit. She agreed. Did advise her that a CXR might be needed but if it is, she could come to the office and go straight back to CXR. She verbalized understanding.   She has been scheduled for 08/08/18 at 930.   Nothing further needed at time of call.

## 2018-08-08 ENCOUNTER — Other Ambulatory Visit: Payer: Self-pay

## 2018-08-08 ENCOUNTER — Ambulatory Visit (INDEPENDENT_AMBULATORY_CARE_PROVIDER_SITE_OTHER): Admitting: Primary Care

## 2018-08-08 ENCOUNTER — Encounter: Payer: Self-pay | Admitting: Primary Care

## 2018-08-08 DIAGNOSIS — R042 Hemoptysis: Secondary | ICD-10-CM | POA: Diagnosis not present

## 2018-08-08 DIAGNOSIS — R918 Other nonspecific abnormal finding of lung field: Secondary | ICD-10-CM

## 2018-08-08 NOTE — Patient Instructions (Addendum)
Hemoptysis: - Avoid NSAIDS - Ok to continue ASA 81 mg - Hydromet syrup as prescribed for cough suppression - Back off flonase some as this can dry out nasal membranes  - Monitor symptoms and if worsens or develop frank red hemoptysis >1 tsp present to ED  - Looking into getting follow -up CT chest done this month re: pulmonary nodule   Follow up with Dr. Lake Bells in [place]    Hemoptysis  Hemoptysis is when you cough up blood. It can be mild or serious. If it is mild, you may cough up bloody spit and mucus (sputum). If you cough up 1-2 cups (240-480 mL) of blood within 24 hours (massive hemoptysis), it is an emergency. If you cough up blood, it is important to go and see your doctor. Follow these instructions at home:  Watch your condition for any changes.  Take over-the-counter and prescription medicines only as told by your doctor.  If you were prescribed an antibiotic medicine, take it as told by your doctor. Do not stop taking the antibiotic even if you start to feel better.  Go back to your normal activities as told by your doctor. Ask your doctor what activities are safe for you to do.  Do not use any products that contain nicotine or tobacco. These include cigarettes and e-cigarettes. If you need help quitting, ask your doctor.  Keep all follow-up visits as told by your doctor. This is important. Contact a doctor if:  You have a fever.  You cough up bloody spit and mucus. Get help right away if:  You cough up fresh blood or blood clots.  You have trouble breathing.  You have chest pain. This information is not intended to replace advice given to you by your health care provider. Make sure you discuss any questions you have with your health care provider. Document Released: 03/07/2012 Document Revised: 12/18/2015 Document Reviewed: 12/18/2015 Elsevier Interactive Patient Education  [employer].

## 2018-08-08 NOTE — Progress Notes (Signed)
Virtual Visit via Telephone Note  I connected with Katrina Campbell on 08/08/18 at  9:30 AM EDT by telephone and verified that I am speaking with the correct person using two identifiers.  Location: Patient: Home Provider: Office   I discussed the limitations, risks, security and privacy concerns of performing an evaluation and management service by telephone and the availability of in person appointments. I also discussed with the patient that there may be a patient responsible charge related to this service. The patient expressed understanding and agreed to proceed.   History of Present Illness: 63 year old female, former smoker quit 2014. PMH significant for COPD GOLD 2, paraseptal emphysema, dyspnea, pulmonary nodules, abnormal CT chest, CAD, thoracic aortic aneurysm, ACS, atherosclerosis of the aorta. Former patient of Dr. Lenna Gilford, [place] with Dr. Lake Bells. Last seen in office on 02/06/2018. Maintained on Advair 250 bid, incruse, mucinex, proair prn and flonase prn.   08/08/2018 Called today with complaints of cough. She normally has some baseline cough but recently developed persistent cough at night which has been keeping her awake. Starting to see some daily spects of blood in her mucus. Denies frank hemoptysis. She is not on a Blood thinner, takes a baby asa daily. Recenly started taking Advil for HA she has been getting in the morning. No changes in her breathing. Continues using Advair and incruse as prescribed, rare use of rescue inhaler. Using flonase a few times a day. She has some Hydromet cough syrup at home, asking if she can take. Discussed safe precautions. Denies sick contact, fever, pleuritic chest pain.   Observations/Objective:  - No shortness of breath, cough or wheezing noted during phone conversation  Testing:  02/22/18 CT Chest- Resolution prev RUL/LLL nodules but new irregular solid 50mm nodule noted in RUL. Otherwise, stable mixed COPD (centrilob emphysema and diffuse  bronchial wall thickening), RML bronchiectasis changes, similar atherosclerotic changes in Ao  11/2017 CTA - negative PE, showed 2 small lung nodules RUL/LLL measuring 6.6 and 58mm, atherosclerotic changes, extatic Ao, copd/emphysema   PFTs 02/2015 showed mod airflow obstruction & GOLD Stage 2 disease (FEV1=1.59 (66%) & %1sec=54%)  Assessment and Plan:  Hemoptysis: - Avoid NSAIDS - Ok to continue ASA 81 mg - Hydromet syrup for cough suppression - Also advised she back off flonase some as this can dry out nasal membranes - Monitor symptoms and if worsens or develops frank red hemoptysis >1 tsp present to ED   Pulmonary nodules: - CT chest in November showed new RUL irregular solid 41mm nodule  - Needs repeat CT chest this month, do not recommend pushing off d/t COVID restrictions  Follow Up Instructions: Follow up with Dr. Lake Bells in [place] (establish care)    I discussed the assessment and treatment plan with the patient. The patient was provided an opportunity to ask questions and all were answered. The patient agreed with the plan and demonstrated an understanding of the instructions.   The patient was advised to call back or seek an in-person evaluation if the symptoms worsen or if the condition fails to improve as anticipated.  I provided 20 minutes of non-face-to-face time during this encounter.   Martyn Ehrich, NP

## 2018-08-09 ENCOUNTER — Telehealth: Payer: Self-pay | Admitting: *Deleted

## 2018-08-09 NOTE — Progress Notes (Signed)
Reviewed,a gree needs CT scan

## 2018-08-09 NOTE — Telephone Encounter (Signed)
Covid-19 travel screening questions  Have you traveled in the last 14 days? If yes where? No Do you now or have you had a fever in the last 14 days? No Do you have any respiratory symptoms of shortness of breath or cough now or in the last 14 days? No Do you have any family members or close contacts with diagnosed or suspected Covid-19? No      

## 2018-08-10 ENCOUNTER — Other Ambulatory Visit: Payer: Self-pay

## 2018-08-10 ENCOUNTER — Ambulatory Visit (INDEPENDENT_AMBULATORY_CARE_PROVIDER_SITE_OTHER)
Admission: RE | Admit: 2018-08-10 | Discharge: 2018-08-10 | Disposition: A | Discharge status: Home / Self Care | Source: Ambulatory Visit | Attending: Primary Care | Admitting: Primary Care

## 2018-08-10 DIAGNOSIS — R918 Other nonspecific abnormal finding of lung field: Secondary | ICD-10-CM

## 2018-08-10 NOTE — Progress Notes (Signed)
Ct scan ordered for 1 year.

## 2018-08-10 NOTE — Progress Notes (Signed)
Called patient and discussed CT chest results, size of previous nodule has decreased in size.  Recommend repeat CT in 12-18 months. No further hemoptysis

## 2018-08-28 ENCOUNTER — Ambulatory Visit: Admitting: Pulmonary Disease

## 2018-08-30 ENCOUNTER — Telehealth: Payer: Self-pay | Admitting: Primary Care

## 2018-08-30 MED ORDER — BENZONATATE 200 MG PO CAPS: 200.0000 mg | ORAL_CAPSULE | Freq: Three times a day (TID) | ORAL | 1 refills | Status: DC | PRN

## 2018-08-30 NOTE — Telephone Encounter (Signed)
Send in RX for tessalon perles take three times a day for cough. Tylenol for headache.

## 2018-08-30 NOTE — Telephone Encounter (Signed)
Called and spoke with pt letting her know that [facility] sent in Rx tessalon to see if this would help with her cough. Stated to her to take tylenol for the headache and pt verbalized understanding. Nothing further needed.

## 2018-08-30 NOTE — Telephone Encounter (Signed)
Called [facility] and cancelled the Rx that had been sent there for the benzonatate. Sent the benzonatate Rx to pt's preferred pharmacy. Called and spoke with pt letting her know this had been done. Pt verbalized understanding. Nothing further needed.

## 2018-08-30 NOTE — Telephone Encounter (Signed)
Primary Pulmonologist: former SN pt, last seen by [facility] Last office visit and with whom: 08/08/2018 with Derl Barrow What do we see them for (pulmonary problems): centrilobular emphysema Last OV assessment/plan: Instructions   Hemoptysis: - Avoid NSAIDS - Ok to continue ASA 81 mg - Hydromet syrup as prescribed for cough suppression - Back off flonase some as this can dry out nasal membranes  - Monitor symptoms and if worsens or develop frank red hemoptysis >1 tsp present to ED  - Looking into getting follow -up CT chest done this month re: pulmonary nodule   Follow up with Dr. Lake Bells in [place]    Hemoptysis   Hemoptysis is when you cough up blood. It can be mild or serious. If it is mild, you may cough up bloody spit and mucus (sputum). If you cough up 1-2 cups (240-480 mL) of blood within 24 hours (massive hemoptysis), it is an emergency. If you cough up blood, it is important to go and see your doctor. Follow these instructions at home:  Watch your condition for any changes.  Take over-the-counter and prescription medicines only as told by your doctor.  If you were prescribed an antibiotic medicine, take it as told by your doctor. Do not stop taking the antibiotic even if you start to feel better.  Go back to your normal activities as told by your doctor. Ask your doctor what activities are safe for you to do.  Do not use any products that contain nicotine or tobacco. These include cigarettes and e-cigarettes. If you need help quitting, ask your doctor.  Keep all follow-up visits as told by your doctor. This is important. Contact a doctor if:  You have a fever.  You cough up bloody spit and mucus. Get help right away if:  You cough up fresh blood or blood clots.  You have trouble breathing.  You have chest pain. This information is not intended to replace advice given to you by your health care provider. Make sure you discuss any questions you have with your health  care provider. Document Released: 03/07/2012 Document Revised: 12/18/2015 Document Reviewed: 12/18/2015 Elsevier Interactive Patient Education  2019 [employer].       Was appointment offered to patient (explain)?  Pt wanting recommendations   Reason for call: Called and spoke with pt who stated she is still coughing and having pain in her chest which pt stated it is soreness/pain from the coughing. Pt also states from coughing so much she also has been having a headache.  Pt was taking the cough med that was prescribed by PCP but did not like the way it way it was making her feel so after two doses she stopped taking it and began taking mucinex.  Pt is also still taking her advair and incruse daily as prescribed.  Asked pt if she has had to use her rescue inhaler and pt stated that she has not used it.  Pt denies any temp as she stated that her temp was 98.6 about an hour ago. Pt denies any body aches or chills. Pt denies any recent travel and has not been around anyone that has been sick or exposed to [place].  Pt states when she coughs, the mucus is clear that she gets up. Pt wants to know what we recommend to help with her symptoms. Beth, please advise on this for pt. Thanks!  (examples of things to ask: : When did symptoms start? Fever? Cough? Productive? Color to sputum? More sputum  than usual? Wheezing? Have you needed increased oxygen? Are you taking your respiratory medications? What over the counter measures have you tried?)

## 2018-09-05 ENCOUNTER — Telehealth: Payer: Self-pay

## 2018-09-05 DIAGNOSIS — R059 Cough, unspecified: Secondary | ICD-10-CM

## 2018-09-05 DIAGNOSIS — R05 Cough: Secondary | ICD-10-CM

## 2018-09-05 NOTE — Telephone Encounter (Signed)
Called and spoke with Patient.  Patient denies fever, or SHOB at this time.  Patient stated she did not want hydromet cough syrup, because when last prescribed, it made her itch. Explained sputum culture, understanding stated.   Message routed to [place], NP

## 2018-09-05 NOTE — Telephone Encounter (Signed)
Needs sputum culture. Any fever or sob? Offer to refill hydromet cough syrup

## 2018-09-05 NOTE — Telephone Encounter (Signed)
Sputum orders in x3.  Nothing further is needed.  Cups at the front.

## 2018-09-05 NOTE — Telephone Encounter (Signed)
  Ladona Ridgel were sent to pharmacy for cough 08/30/18. Pt states she was told to call back if cough not improved. Pt can't sleep she is up coughing all night. Tessalon not helping she took 1 before bed last night and had to take another one within 2 hrs. Cough-thick clear mucus She is taking Mucinex as well.     Hemoptysis: - Avoid NSAIDS - Ok to continue ASA 81 mg - Hydromet syrup as prescribed for cough suppression - Back off flonase some as this can dry out nasal membranes  - Monitor symptoms and if worsens or develop frank red hemoptysis >1 tsp present to ED  - Looking into getting follow -up CT chest done this month re: pulmonary nodule   Follow up with Dr. Lake Bells in [place]    Hemoptysis   Hemoptysis is when you cough up blood. It can be mild or serious. If it is mild, you may cough up bloody spit and mucus (sputum). If you cough up 1-2 cups (240-480 mL) of blood within 24 hours (massive hemoptysis), it is an emergency. If you cough up blood, it is important to go and see your doctor. Follow these instructions at home:  Watch your condition for any changes.  Take over-the-counter and prescription medicines only as told by your doctor.  If you were prescribed an antibiotic medicine, take it as told by your doctor. Do not stop taking the antibiotic even if you start to feel better.  Go back to your normal activities as told by your doctor. Ask your doctor what activities are safe for you to do.  Do not use any products that contain nicotine or tobacco. These include cigarettes and e-cigarettes. If you need help quitting, ask your doctor.  Keep all follow-up visits as told by your doctor. This is important. Contact a doctor if:  You have a fever.  You cough up bloody spit and mucus. Get help right away if:  You cough up fresh blood or blood clots.  You have trouble breathing.  You have chest pain.

## 2018-09-05 NOTE — Telephone Encounter (Signed)
Can you please order all three sputum cultures for me lisa?

## 2018-09-06 NOTE — Telephone Encounter (Signed)
Pt is calling back about her cough   [phone]

## 2018-09-06 NOTE — Telephone Encounter (Signed)
Returned call to patient.  Patient was unclear as to whether or not she was supposed to pick up sputum cup or not.  Advised cup is at front door for pick up and to read over instructions when she picks it up to make sure she has no further questions before leaving with cup.  Patient acknowledged understanding and plans to pick up specimen cup tomorrow.  Nothing further needed at this time.

## 2018-09-10 ENCOUNTER — Other Ambulatory Visit

## 2018-09-10 DIAGNOSIS — R059 Cough, unspecified: Secondary | ICD-10-CM

## 2018-09-10 DIAGNOSIS — R05 Cough: Secondary | ICD-10-CM

## 2018-09-11 ENCOUNTER — Telehealth: Payer: Self-pay | Admitting: Primary Care

## 2018-09-11 NOTE — Telephone Encounter (Signed)
She does not need another CT. Needs repeat in 12-18 months.

## 2018-09-11 NOTE — Telephone Encounter (Signed)
Spoke with the pt  She looked at her mychart today and noticed she has ct pending for 10/01/2018 Her last ct just done on 08/10/2018 and results below:   Notes recorded by Martyn Ehrich, NP on 08/10/2018 at 1:55 PM EDT Called patient and discussed CT chest results, size of previous nodule has decreased in size. Recommend repeat CT in 12-18 months. No further hemoptysis     Beth- pt asking why she needs another ct chest so soon Please advise thanks

## 2018-09-12 NOTE — Telephone Encounter (Signed)
Pt has been made aware no need for another CT until 12-18 mos. Nothing further needed.

## 2018-09-14 ENCOUNTER — Telehealth: Payer: Self-pay | Admitting: Primary Care

## 2018-09-14 DIAGNOSIS — R093 Abnormal sputum: Secondary | ICD-10-CM

## 2018-09-14 MED ORDER — LEVOFLOXACIN 500 MG PO TABS: 500.0000 mg | ORAL_TABLET | Freq: Every day | ORAL | 0 refills | Status: DC

## 2018-09-14 NOTE — Telephone Encounter (Signed)
Spoke with pt and relayed results. Pt stated she is having cough with clear mucus (about 2 tablespoonsfull), slight SOB with exercise and not sleeping. Antibiotic sent to preferred pharmacy. Will refer to ID. Nothing further is needed.

## 2018-09-14 NOTE — Progress Notes (Deleted)
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 Does she have active symptoms -Cough, fever, sob? Please refer to infectious disease.

## 2018-09-14 NOTE — Progress Notes (Addendum)
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 Does she have active symptoms -Cough, fever, sob? Please refer to infectious disease.   Patient contacted by nurse and stated that she has a constant productive cough with clear sputum and increases shortness of breath. Sending in RX levaquin.

## 2018-09-14 NOTE — Telephone Encounter (Signed)
Spoke with pt, advised her that we sent her ABX to the pharmacy. Pt understood. Nothing further is needed.

## 2018-09-14 NOTE — Progress Notes (Signed)
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 Does she have active symptoms -Cough, fever, sob? Please refer to infectious disease.

## 2018-09-24 ENCOUNTER — Other Ambulatory Visit: Payer: Self-pay

## 2018-09-24 ENCOUNTER — Encounter: Payer: Self-pay | Admitting: Infectious Disease

## 2018-09-24 ENCOUNTER — Ambulatory Visit (INDEPENDENT_AMBULATORY_CARE_PROVIDER_SITE_OTHER): Payer: TRICARE For Life (TFL) | Admitting: Infectious Disease

## 2018-09-24 VITALS — BP 164/92 | HR 68 | Temp 98.3°F | Wt 138.0 lb

## 2018-09-24 DIAGNOSIS — J42 Unspecified chronic bronchitis: Secondary | ICD-10-CM | POA: Diagnosis not present

## 2018-09-24 DIAGNOSIS — R05 Cough: Secondary | ICD-10-CM

## 2018-09-24 DIAGNOSIS — R9389 Abnormal findings on diagnostic imaging of other specified body structures: Secondary | ICD-10-CM

## 2018-09-24 DIAGNOSIS — R918 Other nonspecific abnormal finding of lung field: Secondary | ICD-10-CM | POA: Diagnosis not present

## 2018-09-24 DIAGNOSIS — A498 Other bacterial infections of unspecified site: Secondary | ICD-10-CM

## 2018-09-24 DIAGNOSIS — Z8619 Personal history of other infectious and parasitic diseases: Secondary | ICD-10-CM

## 2018-09-24 DIAGNOSIS — R053 Chronic cough: Secondary | ICD-10-CM

## 2018-09-24 MED ORDER — SULFAMETHOXAZOLE-TRIMETHOPRIM 800-160 MG PO TABS: 1.0000 | ORAL_TABLET | Freq: Two times a day (BID) | ORAL | 1 refills | Status: DC

## 2018-09-24 NOTE — Progress Notes (Signed)
Subjective:   Reason for Consult: Stenotrophomonas in sputum and history of protracted cough  Requesting Physician: Geraldo Pitter, NP    Patient ID: Katrina Campbell, female    DOB: 10-12-1955, 63 y.o.   MRN: [id number]  HPI  63 year old female, former smoker quit 2014. PMH significant for COPD GOLD 2, paraseptal emphysema, dyspnea, pulmonary nodules, abnormal CT chest, CAD, thoracic aortic aneurysm, ACS, atherosclerosis of the aorta.  Patient is still being followed in pulmonary clinic and most recently seen by Derl Barrow in a virtual visit on Aug 08, 2018.  She relates a history of worsening cough this spring that has been going on since February.  This began shortly after she was discharged in the hospital for chest pain with a work-up and thought that she was suffering from gastroesophageal reflux disease.  She is not having fevers or systemic symptoms of nausea or vomiting but has had some hemoptysis in the past which improved with cessation of nonsteroidals, continues to cough throughout the day at worse at night in terms of how much it bothers her.  She makes quite a bit of sputum.  This is been soft for AFB and for fungal and for bacterial culture and the Stenotrophomonas maltophilia organism was isolated.  She denies having been on recent antibiotics was given levofloxacin prescription but did not want to take it because she read that it was "an antibiotic of last resort".  I noted her history of nodules that if she shown up and then disappeared and gotten smaller in time.  I wondered if she could potentially be harboring Mycobacterium avium though she did not grow on her most recent AFB culture yet.  She also does not have other symptoms that would fit perfectly with that diagnosis such as weight loss or fevers.  But she certainly does have a chronic cough that is not resolving.  She denies being on PPIs or if that helped her for her chronic cough either.    Past Medical  History:  Diagnosis Date  . Anxiety   . COPD (chronic obstructive pulmonary disease) ([place])   . Depression   . Hyperlipidemia   . Labile hypertension     Past Surgical History:  Procedure Laterality Date  . LEFT HEART CATH AND CORONARY ANGIOGRAPHY N/A 11/07/2017   Procedure: LEFT HEART CATH AND CORONARY ANGIOGRAPHY;  Surgeon: Nelva Bush, MD;  Location: [facility];  Service: Cardiovascular;  Laterality: N/A;  . NASAL SINUS SURGERY  1986    Family History  Problem Relation Age of Onset  . Heart attack Mother   . Emphysema Mother   . Congestive Heart Failure Mother   . Heart attack Father   . Congestive Heart Failure Father   . Asthma Other   . Lung disease Sister   . Lung disease Brother   . Colon cancer Neg Hx       Social History   Socioeconomic History  . Marital status: Married    Spouse name: Not on file  . Number of children: Not on file  . Years of education: Not on file  . Highest education level: Not on file  Occupational History  . Not on file  Social Needs  . Financial resource strain: Not on file  . Food insecurity    Worry: Not on file    Inability: Not on file  . Transportation needs    Medical: Not on file    Non-medical: Not on file  Tobacco Use  .  Smoking status: Former Smoker    Packs/day: 1.00    Years: 40.00    Pack years: 40.00    Types: Cigarettes    Quit date: 11/09/2012    Years since quitting: 5.8  . Smokeless tobacco: Never Used  . Tobacco comment: vapor  Substance and Sexual Activity  . Alcohol use: Yes    Alcohol/week: 24.0 standard drinks    Types: 24 Cans of beer per week  . Drug use: No  . Sexual activity: Not Currently  Lifestyle  . Physical activity    Days per week: Not on file    Minutes per session: Not on file  . Stress: Not on file  Relationships  . Social Herbalist on phone: Not on file    Gets together: Not on file    Attends religious service: Not on file    Active member of club or  organization: Not on file    Attends meetings of clubs or organizations: Not on file    Relationship status: Not on file  Other Topics Concern  . Not on file  Social History Narrative  . Not on file    No Known Allergies   Current Outpatient Medications:  .  albuterol (PROAIR HFA) 108 (90 BASE) MCG/ACT inhaler, Inhale 2 puffs into the lungs every 6 (six) hours as needed for wheezing or shortness of breath., Disp: 18 g, Rfl: 3 .  aspirin EC 81 MG tablet, Take 81 mg by mouth daily., Disp: , Rfl:  .  buPROPion (WELLBUTRIN XL) 300 MG 24 hr tablet, TAKE 1 TABLET DAILY (Patient taking differently: Take 300 mg by mouth daily. ), Disp: 90 tablet, Rfl: 4 .  Calcium Carb-Cholecalciferol (CALCIUM-VITAMIN D) 500-400 MG-UNIT TABS, 1 a day, Disp: 60 tablet, Rfl:  .  fluticasone (FLONASE) 50 MCG/ACT nasal spray, Place 2 sprays into both nostrils daily. (Patient taking differently: Place 2 sprays into both nostrils daily as needed for rhinitis. ), Disp: 48 g, Rfl: 3 .  Fluticasone-Salmeterol (ADVAIR DISKUS) 250-50 MCG/DOSE AEPB, USE 1 INHALATION TWICE A DAY (Patient taking differently: Inhale 1 puff into the lungs 2 (two) times daily. ), Disp: 180 each, Rfl: 2 .  guaiFENesin (MUCINEX) 600 MG 12 hr tablet, Take 1,200 mg by mouth 2 (two) times daily as needed for to loosen phlegm. , Disp: , Rfl:  .  ibuprofen (ADVIL) 200 MG tablet, Take 400 mg by mouth every 8 (eight) hours as needed for fever., Disp: , Rfl:  .  INCRUSE ELLIPTA 62.5 MCG/INH AEPB, USE 1 INHALATION DAILY (DISCARD 6 WEEKS AFTER OPENING FOIL TRAY) (Patient taking differently: Inhale 1 puff into the lungs daily. ), Disp: 90 each, Rfl: 1 .  MAGNESIUM PO, Take 1 tablet by mouth daily. , Disp: , Rfl:  .  Omega-3 Fatty Acids (FISH OIL PO), Take 500 mg by mouth daily., Disp: , Rfl:  .  rosuvastatin (CRESTOR) 10 MG tablet, Take 1 tablet (10 mg total) by mouth daily at 6 PM., Disp: 90 tablet, Rfl: 3 .  vitamin C (ASCORBIC ACID) 500 MG tablet, Take 500 mg  by mouth every morning., Disp: , Rfl:  .  benzonatate (TESSALON) 200 MG capsule, Take 1 capsule (200 mg total) by mouth 3 (three) times daily as needed for cough. (Patient not taking: Reported on 09/24/2018), Disp: 30 capsule, Rfl: 1 .  sulfamethoxazole-trimethoprim (BACTRIM DS) 800-160 MG tablet, Take 1 tablet by mouth 2 (two) times daily., Disp: 20 tablet, Rfl: 1   Review  of Systems  Constitutional: Negative for chills and fever.  HENT: Negative for congestion and sore throat.   Eyes: Negative for photophobia.  Respiratory: Positive for cough, chest tightness and shortness of breath. Negative for wheezing.   Cardiovascular: Negative for chest pain, palpitations and leg swelling.  Gastrointestinal: Negative for abdominal pain, blood in stool, constipation, diarrhea, nausea and vomiting.  Genitourinary: Negative for dysuria, flank pain and hematuria.  Musculoskeletal: Negative for back pain and myalgias.  Skin: Negative for rash.  Neurological: Negative for dizziness, weakness and headaches.  Hematological: Does not bruise/bleed easily.  Psychiatric/Behavioral: Negative for suicidal ideas.       Objective:   Physical Exam Constitutional:      General: She is not in acute distress.    Appearance: She is not diaphoretic.  HENT:     Head: Normocephalic and atraumatic.     Right Ear: External ear normal.     Left Ear: External ear normal.     Nose: Nose normal.     Mouth/Throat:     Pharynx: No oropharyngeal exudate.  Eyes:     General: No scleral icterus.    Conjunctiva/sclera: Conjunctivae normal.     Pupils: Pupils are equal, round, and reactive to light.  Neck:     Musculoskeletal: Normal range of motion and neck supple.  Cardiovascular:     Rate and Rhythm: Normal rate and regular rhythm.     Heart sounds: Normal heart sounds. No murmur. No friction rub. No gallop.   Pulmonary:     Effort: Prolonged expiration present. No respiratory distress.     Breath sounds: Normal  breath sounds. No wheezing or rales.  Abdominal:     General: Bowel sounds are normal. There is no distension.     Palpations: Abdomen is soft.     Tenderness: There is no abdominal tenderness. There is no rebound.  Musculoskeletal: Normal range of motion.        General: No tenderness.  Lymphadenopathy:     Cervical: No cervical adenopathy.  Skin:    General: Skin is warm and dry.     Coloration: Skin is not pale.     Findings: No erythema or rash.  Neurological:     Mental Status: She is alert and oriented to person, place, and time.     Coordination: Coordination normal.  Psychiatric:        Judgment: Judgment normal.           Assessment & Plan:   Chronic cough in a patient with history of lung nodules and COPD:  I would like to work her up for Mycobacterium avium and get 2 additional sputum's for AFB culture.  I will also try to treat the stenotrophomonas with Bactrim.  I think other things such as gastroesophageal reflux disease potentially competitive attributing to her pathology should be considered`   Stenotrophomonas maltophilia: Not sure this is a colonizer pathogen but we will plan on giving her a 10-day course of Bactrim double strength 1 tablet twice daily.  Have asked her to drink copious amounts of fluids and avoid foods that are high in potassium.  I will plan on seeing her back in 1 month's time  I spent greater than 60 minutes with the patient including greater than 50% of time in face to face counsel of the patient during differential for chronic cough, including infectious causes causes such as Mycobacterium avium, unusual organism such as stenotrophomonas, more typically seen in highly treatment experienced individuals, other  causes of cough and in coordination of her care.

## 2018-09-26 ENCOUNTER — Other Ambulatory Visit: Payer: Medicare Other

## 2018-09-26 ENCOUNTER — Other Ambulatory Visit: Payer: Self-pay

## 2018-09-26 DIAGNOSIS — R05 Cough: Secondary | ICD-10-CM

## 2018-09-26 DIAGNOSIS — R059 Cough, unspecified: Secondary | ICD-10-CM

## 2018-10-01 ENCOUNTER — Inpatient Hospital Stay: Admission: RE | Admit: 2018-10-01 | Payer: TRICARE For Life (TFL) | Source: Ambulatory Visit

## 2018-10-12 ENCOUNTER — Telehealth: Payer: Self-pay | Admitting: Cardiology

## 2018-10-12 DIAGNOSIS — I2510 Atherosclerotic heart disease of native coronary artery without angina pectoris: Secondary | ICD-10-CM

## 2018-10-12 DIAGNOSIS — I712 Thoracic aortic aneurysm, without rupture, unspecified: Secondary | ICD-10-CM

## 2018-10-12 DIAGNOSIS — E785 Hyperlipidemia, unspecified: Secondary | ICD-10-CM

## 2018-10-12 DIAGNOSIS — I700 Atherosclerosis of aorta: Secondary | ICD-10-CM

## 2018-10-12 NOTE — Telephone Encounter (Signed)
New Message:  Patient wants to know if she will have to have labs drawn prior to her appt with Dr. Meda Coffee in October.

## 2018-10-12 NOTE — Telephone Encounter (Signed)
Called the pt and ordered for her to come in on October 1 at 0950 for lab to check lipids, cmet, cbc w diff, and TSH.  Pt is aware to come fasting to this appt.  Pts OV appt with Dr Meda Coffee is scheduled the following week on 10/6.  Pt verbalized understanding and agrees with this plan.

## 2018-10-18 LAB — HM MAMMOGRAPHY

## 2018-10-21 ENCOUNTER — Other Ambulatory Visit: Payer: Self-pay | Admitting: Pulmonary Disease

## 2018-10-24 ENCOUNTER — Ambulatory Visit (INDEPENDENT_AMBULATORY_CARE_PROVIDER_SITE_OTHER): Payer: Medicare Other | Admitting: Infectious Disease

## 2018-10-24 ENCOUNTER — Encounter: Payer: Self-pay | Admitting: Infectious Disease

## 2018-10-24 ENCOUNTER — Other Ambulatory Visit: Payer: Self-pay

## 2018-10-24 ENCOUNTER — Telehealth: Payer: Self-pay | Admitting: Primary Care

## 2018-10-24 DIAGNOSIS — R9389 Abnormal findings on diagnostic imaging of other specified body structures: Secondary | ICD-10-CM | POA: Diagnosis not present

## 2018-10-24 DIAGNOSIS — R918 Other nonspecific abnormal finding of lung field: Secondary | ICD-10-CM | POA: Diagnosis not present

## 2018-10-24 DIAGNOSIS — R0600 Dyspnea, unspecified: Secondary | ICD-10-CM

## 2018-10-24 DIAGNOSIS — A310 Pulmonary mycobacterial infection: Secondary | ICD-10-CM | POA: Insufficient documentation

## 2018-10-24 DIAGNOSIS — R0609 Other forms of dyspnea: Secondary | ICD-10-CM

## 2018-10-24 DIAGNOSIS — J42 Unspecified chronic bronchitis: Secondary | ICD-10-CM | POA: Diagnosis not present

## 2018-10-24 HISTORY — DX: Pulmonary mycobacterial infection: A31.0

## 2018-10-24 NOTE — Progress Notes (Signed)
Virtual Visit via Video Note  I connected with Katrina Campbell on 10/24/18 at  2:30 PM EDT by a video enabled telemedicine application and verified that I am speaking with the correct person using two identifiers.  Location: Patient: Home Provider: My Home  I discussed the limitations of evaluation and management by telemedicine and the availability of in person appointments. The patient expressed understanding and agreed to proceed.  History of Present Illness:  63 year old female, former smoker quit 2014. PMH significant for COPD GOLD 2, paraseptal emphysema, dyspnea, pulmonary nodules, abnormal CT chest, CAD, thoracic aortic aneurysm, ACS, atherosclerosis of the aorta.  Patient is still being followed in pulmonary clinic and most recently seen by Derl Barrow in a virtual visit on Aug 08, 2018.  She related a history of worsening cough this spring that has been going on since February.  This began shortly after she was discharged in the hospital for chest pain with a work-up and thought that she was suffering from gastroesophageal reflux disease.  She is not having fevers or systemic symptoms of nausea or vomiting but has had some hemoptysis in the past which improved with cessation of nonsteroidals, continues to cough throughout the day at worse at night in terms of how much it bothers her.  She makes quite a bit of sputum.  This is been ssent for AFB and for fungal and for bacterial culture and the Stenotrophomonas maltophilia organism was isolated.  She denies having been on recent antibiotics was given levofloxacin prescription but did not want to take it because she read that it was "an antibiotic of last resort".  I noted her history of nodules that if she shown up and then disappeared and gotten smaller in time.  I wondered if she could potentially be harboring Mycobacterium avium though she did not grow on her most  recent AFB culture from [facility]  She also dod not have other  symptoms that would fit perfectly with that diagnosis such as weight loss or fevers.  But she certainly does have a chronic cough that is not resolving.  She denies being on PPIs or if that helped her for her chronic cough either.  When I last saw her we decided to give her therapy for stenotrophomonas with Bactrim  We did this and it made absolutely no difference in her symptoms showing that this is a colonizer not a culprit organism for her cough.  She brought 3 sputum's for [place] to send to the lab for AFB stain and culture but only 1 culture was sent which was positive for Mycobacterium avium intracellulare.  I think it would be worthwhile initiating therapy for M avium to see if it improves her cough.  However she would like to first talk to her a pulmonary physician which I think is quite reasonable since treating Mycobacterium avium is an arduous affair and certainly not an emergency.   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  Her radiographic changes or not a's "slam dunk" for Mycobacterium avium but given the ongoing cough I think she warrants a therapeutic trial of M avium therapy.  She would like to discuss this with pulmonary first which is quite reasonable.    Follow Up Instructions:  I discussed the assessment and treatment plan with the patient. The patient was provided an opportunity to ask questions and all were answered. The patient agreed with the plan and demonstrated an understanding of the instructions.   The patient was advised to call back or seek an in-person evaluation if the symptoms worsen or if the condition fails to improve as anticipated.  I provided 21  minutes of non-face-to-face time during this encounter.   Alcide Evener, MD

## 2018-10-24 NOTE — Telephone Encounter (Signed)
Called spoke with patient to inquire about her cough and if sooner appt was needed.  Per patient her cough is NOT acute - is the same nagging cough that we have been treating.  Per patient she is requesting a 2nd opinion from her last visit with ID w/ [name] (6.22.2020 and virtual visit 10/24/18).  Appt has already been scheduled with Dr Vaughan Browner on 7.31.2020 @ 1100.  Patient is aware to contact the office for sooner follow up if her symptoms worsen prior to ov.  Will sign and route to Dr Vaughan Browner to make him aware.

## 2018-10-25 LAB — RESPIRATORY CULTURE OR RESPIRATORY AND SPUTUM CULTURE
MICRO NUMBER:: 546674
RESULT:: NORMAL
SPECIMEN QUALITY:: ADEQUATE

## 2018-10-25 LAB — FUNGUS CULTURE W SMEAR
MICRO NUMBER:: 546672
SMEAR:: NONE SEEN
SPECIMEN QUALITY:: ADEQUATE

## 2018-10-25 LAB — MYCOBACTERIA,CULT W/FLUOROCHROME SMEAR
MICRO NUMBER:: 546673
SMEAR:: NONE SEEN
SPECIMEN QUALITY:: ADEQUATE

## 2018-10-26 ENCOUNTER — Encounter: Payer: Self-pay | Admitting: *Deleted

## 2018-11-02 ENCOUNTER — Other Ambulatory Visit: Payer: Self-pay

## 2018-11-02 ENCOUNTER — Ambulatory Visit (INDEPENDENT_AMBULATORY_CARE_PROVIDER_SITE_OTHER): Payer: Medicare Other | Admitting: Pulmonary Disease

## 2018-11-02 ENCOUNTER — Encounter: Payer: Self-pay | Admitting: Pulmonary Disease

## 2018-11-02 VITALS — BP 120/64 | HR 69 | Temp 98.0°F | Ht 64.0 in | Wt 136.6 lb

## 2018-11-02 DIAGNOSIS — J449 Chronic obstructive pulmonary disease, unspecified: Secondary | ICD-10-CM

## 2018-11-02 DIAGNOSIS — I2510 Atherosclerotic heart disease of native coronary artery without angina pectoris: Secondary | ICD-10-CM | POA: Diagnosis not present

## 2018-11-02 DIAGNOSIS — R918 Other nonspecific abnormal finding of lung field: Secondary | ICD-10-CM

## 2018-11-02 MED ORDER — TRELEGY ELLIPTA 100-62.5-25 MCG/INH IN AEPB: 1.0000 | INHALATION_SPRAY | Freq: Every day | RESPIRATORY_TRACT | 3 refills | Status: AC

## 2018-11-02 MED ORDER — TRELEGY ELLIPTA 100-62.5-25 MCG/INH IN AEPB: 1.0000 | INHALATION_SPRAY | Freq: Every day | RESPIRATORY_TRACT | 0 refills | Status: DC

## 2018-11-02 NOTE — Patient Instructions (Addendum)
I have reviewed your CT scan There is a small lung nodule that can be followed up in 1 year We will order a CT scan without contrast for nodule follow-up in 1 year We will stop the advair, incruse and change you to a different inhaler called Trelegy Continue using the Mucinex for cough Follow-up in 3 months.

## 2018-11-02 NOTE — Progress Notes (Signed)
Katrina Campbell    [id number]    [birth year]  Primary Care Physician:[place], Modena Nunnery, MD  Referring Physician: Alycia Rossetti, MD [address] [place],  [place] [id number]  Chief complaint: Follow-up for COPD, pulmonary nodules  HPI: 63 year old with history of COPD, lung nodules, hyperlipidemia.  Previously followed by Dr. Belva Chimes of ongoing issues with cough since February 2020.  She had sputum culture which showed stenotrophomonas. Referred to ID.  She was treated Bactrim with no improvement in symptoms.  The stenotrophomonas is thought to be a colonization.  She also had 3 sputum cultures for AFB out of concern for MAI given findings of pulmonary nodules.  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  ID had discussed starting MAI therapy but patient is reluctant and wants a second opinion.  Currently maintained on Advair and Incruse for many years.  She is taking over-the-counter Zyrtec and Mucinex for cough which is helping some.  Denies any fevers, chills, loss of weight, loss of appetite.  Pets: Has a dog.  No cats, birds, farm animals Occupation: Retired Regulatory affairs officer Exposures: No known exposures.  No mold, hot tub, Jacuzzi Smoking history: 40-pack-year smoker.  Quit in 2014 Travel history: Previously lived in [place].  No significant recent travel Relevant family history: Dad had emphysema.  He was a smoker.  Outpatient Encounter Medications as of 11/02/2018  Medication Sig  . albuterol (PROAIR HFA) 108 (90 BASE) MCG/ACT inhaler Inhale 2 puffs into the lungs every 6 (six) hours as needed for wheezing or shortness of breath.  Marland Kitchen aspirin EC 81 MG tablet Take 81 mg by mouth daily.  Marland Kitchen buPROPion (WELLBUTRIN XL) 300 MG 24 hr tablet TAKE 1 TABLET DAILY (Patient taking differently: Take 300 mg by mouth daily. )  . Calcium Carb-Cholecalciferol (CALCIUM-VITAMIN D) 500-400 MG-UNIT TABS 1 a day  . fluticasone (FLONASE) 50 MCG/ACT nasal spray Place 2 sprays into  both nostrils daily. (Patient taking differently: Place 2 sprays into both nostrils daily as needed for rhinitis. )  . Fluticasone-Salmeterol (ADVAIR DISKUS) 250-50 MCG/DOSE AEPB USE 1 INHALATION TWICE A DAY (Patient taking differently: Inhale 1 puff into the lungs 2 (two) times daily. )  . guaiFENesin (MUCINEX) 600 MG 12 hr tablet Take 1,200 mg by mouth 2 (two) times daily as needed for to loosen phlegm.   Marland Kitchen ibuprofen (ADVIL) 200 MG tablet Take 400 mg by mouth every 8 (eight) hours as needed for fever.  Marland Kitchen MAGNESIUM PO Take 1 tablet by mouth daily.   . Omega-3 Fatty Acids (FISH OIL PO) Take 500 mg by mouth daily.  . rosuvastatin (CRESTOR) 10 MG tablet Take 1 tablet (10 mg total) by mouth daily at 6 PM.  . umeclidinium bromide (INCRUSE ELLIPTA) 62.5 MCG/INH AEPB Inhale 1 puff into the lungs daily.  . vitamin C (ASCORBIC ACID) 500 MG tablet Take 500 mg by mouth every morning.  . [DISCONTINUED] benzonatate (TESSALON) 200 MG capsule Take 1 capsule (200 mg total) by mouth 3 (three) times daily as needed for cough. (Patient not taking: Reported on 09/24/2018)  . [DISCONTINUED] sulfamethoxazole-trimethoprim (BACTRIM DS) 800-160 MG tablet Take 1 tablet by mouth 2 (two) times daily.   No facility-administered encounter medications on file as of 11/02/2018.     Allergies as of 11/02/2018  . (No Known Allergies)    Past Medical History:  Diagnosis Date  . Anxiety   . COPD (chronic obstructive pulmonary disease) ([place])   .  Depression   . Hyperlipidemia   . Labile hypertension   . 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 LEFT HEART CATH AND CORONARY ANGIOGRAPHY N/A 11/07/2017   Procedure: LEFT HEART CATH AND CORONARY ANGIOGRAPHY;  Surgeon: Nelva Bush, MD;  Location: [facility];  Service: Cardiovascular;  Laterality: N/A;  . NASAL SINUS SURGERY  1986    Family History  Problem Relation Age of Onset  . Heart attack  Mother   . Emphysema Mother   . Congestive Heart Failure Mother   . Heart attack Father   . Congestive Heart Failure Father   . Asthma Other   . Lung disease Sister   . Lung disease Brother   . Colon cancer Neg Hx     Social History   Socioeconomic History  . Marital status: Married    Spouse name: Not on file  . Number of children: Not on file  . Years of education: Not on file  . Highest education level: Not on file  Occupational History  . Not on file  Social Needs  . Financial resource strain: Not on file  . Food insecurity    Worry: Not on file    Inability: Not on file  . Transportation needs    Medical: Not on file    Non-medical: Not on file  Tobacco Use  . Smoking status: Former Smoker    Packs/day: 1.00    Years: 40.00    Pack years: 40.00    Types: Cigarettes    Quit date: 11/09/2012    Years since quitting: 5.9  . Smokeless tobacco: Never Used  . Tobacco comment: vapor  Substance and Sexual Activity  . Alcohol use: Yes    Alcohol/week: 24.0 standard drinks    Types: 24 Cans of beer per week  . Drug use: No  . Sexual activity: Not Currently  Lifestyle  . Physical activity    Days per week: Not on file    Minutes per session: Not on file  . Stress: Not on file  Relationships  . Social Herbalist on phone: Not on file    Gets together: Not on file    Attends religious service: Not on file    Active member of club or organization: Not on file    Attends meetings of clubs or organizations: Not on file    Relationship status: Not on file  . Intimate partner violence    Fear of current or ex partner: Not on file    Emotionally abused: Not on file    Physically abused: Not on file    Forced sexual activity: Not on file  Other Topics Concern  . Not on file  Social History Narrative  . Not on file    Review of systems: Review of Systems  Constitutional: Negative for fever and chills.  HENT: Negative.   Eyes: Negative for blurred  vision.  Respiratory: as per HPI  Cardiovascular: Negative for chest pain and palpitations.  Gastrointestinal: Negative for vomiting, diarrhea, blood per rectum. Genitourinary: Negative for dysuria, urgency, frequency and hematuria.  Musculoskeletal: Negative for myalgias, back pain and joint pain.  Skin: Negative for itching and rash.  Neurological: Negative for dizziness, tremors, focal weakness, seizures and loss of consciousness.  Endo/Heme/Allergies: Negative for environmental allergies.  Psychiatric/Behavioral: Negative for depression, suicidal ideas and hallucinations.  All other systems reviewed and are negative.  Physical Exam: Blood pressure  120/64, pulse 69, temperature 98 F (36.7 C), temperature source Oral, height _0  (1.626 m), weight 136 lb 9.6 oz (62 kg), SpO2 97 %. Gen:      No acute distress HEENT:  EOMI, sclera anicteric Neck:     No masses; no thyromegaly Lungs:    Clear to auscultation bilaterally; normal respiratory effort CV:         Regular rate and rhythm; no murmurs Abd:      + bowel sounds; soft, non-tender; no palpable masses, no distension Ext:    No edema; adequate peripheral perfusion Skin:      Warm and dry; no rash Neuro: alert and oriented x 3 Psych: normal mood and affect  Data Reviewed: Imaging: CT chest 05/21/2015- 4 mm nodule in the minor fissure.  Emphysema. CT chest 05/31/2016- resolution of pulmonary nodule.  Emphysema.   CT chest 11/07/2017- nodular densities in the right upper lobe.  5 mm nodule in the left lower lobe. Emphysema.  Atelectasis in the right lower lobe and lingula. CT chest 02/22/2018- previously described lung nodules have resolved.  New 8 mm left apical lung nodule.  Emphysema CT chest 08/10/2018- improving size of left upper lobe nodule.  Emphysema. I have reviewed the images personally.  PFTs: Spirometry 02/10/2015 FVC 3.0 [97%], FEV1 1.6 [6 6%],F/F 54 Moderate obstruction.  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  Follow-up repeat imaging in 1 year Reasonable to consider MAI infection especially as her pulmonary nodules have waxed and waned however CT scan findings are not very typical of this.  Discussed watchful waiting versus further work-up with BAL with patient.  She would prefer to hold on any treatment or investigation for now.  Plan/Recommendations: - Stop Advair, Incruse.  Start Trelegy - Mucinex - Follow-up CT in 1 year.  Marshell Garfinkel MD [facility] 11/02/2018, 11:29 AM  CC: Alycia Rossetti, MD

## 2018-11-07 LAB — MYCOBACTERIA,CULT W/FLUOROCHROME SMEAR
MICRO NUMBER:: 605049
SMEAR:: NONE SEEN
SPECIMEN QUALITY:: ADEQUATE

## 2018-11-26 ENCOUNTER — Ambulatory Visit: Payer: Medicare Other | Admitting: Infectious Disease

## 2019-01-02 ENCOUNTER — Other Ambulatory Visit: Payer: Self-pay

## 2019-01-02 ENCOUNTER — Other Ambulatory Visit: Payer: Medicare Other | Admitting: *Deleted

## 2019-01-02 LAB — COMPREHENSIVE METABOLIC PANEL
ALT: 27 IU/L (ref 0–32)
AST: 32 IU/L (ref 0–40)
Albumin/Globulin Ratio: 1.9 (ref 1.2–2.2)
Albumin: 4.4 g/dL (ref 3.8–4.8)
Alkaline Phosphatase: 95 IU/L (ref 39–117)
BUN/Creatinine Ratio: 17 (ref 12–28)
BUN: 15 mg/dL (ref 8–27)
Bilirubin Total: 0.4 mg/dL (ref 0.0–1.2)
CO2: 24 mmol/L (ref 20–29)
Calcium: 9.2 mg/dL (ref 8.7–10.3)
Chloride: 104 mmol/L (ref 96–106)
Creatinine, Ser: 0.88 mg/dL (ref 0.57–1.00)
GFR calc Af Amer: 80 mL/min/{1.73_m2} (ref >59)
GFR calc non Af Amer: 69 mL/min/{1.73_m2} (ref >59)
Globulin, Total: 2.3 g/dL (ref 1.5–4.5)
Glucose: 89 mg/dL (ref 65–99)
Potassium: 4.3 mmol/L (ref 3.5–5.2)
Sodium: 139 mmol/L (ref 134–144)
Total Protein: 6.7 g/dL (ref 6.0–8.5)

## 2019-01-02 LAB — CBC WITH DIFFERENTIAL/PLATELET
Basophils Absolute: 0 10*3/uL (ref 0.0–0.2)
Basos: 0 %
EOS (ABSOLUTE): 0.1 10*3/uL (ref 0.0–0.4)
Eos: 1 %
Hematocrit: 41.9 % (ref 34.0–46.6)
Hemoglobin: 14.5 g/dL (ref 11.1–15.9)
Immature Grans (Abs): 0 10*3/uL (ref 0.0–0.1)
Immature Granulocytes: 0 %
Lymphocytes Absolute: 2.1 10*3/uL (ref 0.7–3.1)
Lymphs: 34 %
MCH: 30.9 pg (ref 26.6–33.0)
MCHC: 34.6 g/dL (ref 31.5–35.7)
MCV: 89 fL (ref 79–97)
Monocytes Absolute: 0.4 10*3/uL (ref 0.1–0.9)
Monocytes: 7 %
Neutrophils Absolute: 3.4 10*3/uL (ref 1.4–7.0)
Neutrophils: 58 %
Platelets: 154 10*3/uL (ref 150–450)
RBC: 4.7 x10E6/uL (ref 3.77–5.28)
RDW: 12.2 % (ref 11.7–15.4)
WBC: 6 10*3/uL (ref 3.4–10.8)

## 2019-01-02 LAB — LIPID PANEL
Chol/HDL Ratio: 2.6 ratio (ref 0.0–4.4)
Cholesterol, Total: 173 mg/dL (ref 100–199)
HDL: 67 mg/dL (ref >39)
LDL Chol Calc (NIH): 90 mg/dL (ref 0–99)
Triglycerides: 87 mg/dL (ref 0–149)
VLDL Cholesterol Cal: 16 mg/dL (ref 5–40)

## 2019-01-02 LAB — TSH: TSH: 5.32 u[IU]/mL — ABNORMAL HIGH (ref 0.450–4.500)

## 2019-01-03 ENCOUNTER — Other Ambulatory Visit: Payer: Medicare Other

## 2019-01-04 ENCOUNTER — Other Ambulatory Visit: Payer: Self-pay

## 2019-01-07 ENCOUNTER — Ambulatory Visit (INDEPENDENT_AMBULATORY_CARE_PROVIDER_SITE_OTHER): Payer: Medicare Other | Admitting: Family Medicine

## 2019-01-07 ENCOUNTER — Other Ambulatory Visit: Payer: Self-pay

## 2019-01-07 ENCOUNTER — Encounter: Payer: Self-pay | Admitting: Family Medicine

## 2019-01-07 VITALS — BP 132/68 | HR 68 | Temp 98.3°F | Resp 14 | Ht 64.0 in | Wt 140.0 lb

## 2019-01-07 DIAGNOSIS — I2510 Atherosclerotic heart disease of native coronary artery without angina pectoris: Secondary | ICD-10-CM

## 2019-01-07 DIAGNOSIS — E785 Hyperlipidemia, unspecified: Secondary | ICD-10-CM

## 2019-01-07 DIAGNOSIS — R7989 Other specified abnormal findings of blood chemistry: Secondary | ICD-10-CM

## 2019-01-07 DIAGNOSIS — F320 Major depressive disorder, single episode, mild: Secondary | ICD-10-CM

## 2019-01-07 DIAGNOSIS — F5104 Psychophysiologic insomnia: Secondary | ICD-10-CM

## 2019-01-07 DIAGNOSIS — J42 Unspecified chronic bronchitis: Secondary | ICD-10-CM

## 2019-01-07 LAB — T4, FREE: Free T4: 1.1 ng/dL (ref 0.8–1.8)

## 2019-01-07 LAB — TSH: TSH: 2.63 mIU/L (ref 0.40–4.50)

## 2019-01-07 LAB — T3, FREE: T3, Free: 2.9 pg/mL (ref 2.3–4.2)

## 2019-01-07 MED ORDER — TRAZODONE HCL 50 MG PO TABS: 25.0000 mg | ORAL_TABLET | Freq: Every evening | ORAL | 3 refills | Status: DC | PRN

## 2019-01-07 NOTE — Assessment & Plan Note (Signed)
Per above start trazodone

## 2019-01-07 NOTE — Assessment & Plan Note (Signed)
She has an appoint with cardiology tomorrow.  She has not had any chest pains or recent angina symptoms.  Her other labs are fairly unremarkable.  Her abnormal TSH could just be transmitted it could also be a sign of stress in the setting of this possible MAI infection that pulmonary is currently working up.  Today Katrina Campbell repeat her thyroid function along with the T3 and T4 to see if she does have true underlying disorder. Not start any medications at this time.  If it is still borderline I will actually repeat her levels in 6 to 8 weeks.  Brought in the chronic insomnia and stress we discussed grief counseling but she wants to hold off at this time.  We will go ahead and start her on trazodone 25 to 50 mg at bedtime to see if this helps with sleep and her overall mood.

## 2019-01-07 NOTE — Progress Notes (Signed)
Subjective:    Patient ID: Katrina Campbell, female    DOB: September 30, 1955, 63 y.o.   MRN: [id number]  Patient presents for Follow-up (had labs done at [facility] that showed elevated tsh)   Pt here due to elevated TSH at  5.320 was found on her routine lab per her cardiologist.  She has not had any particular symptoms that she is aware of.  She does have chronic fatigue but she thinks that this is secondary to depression and not sleeping well.  Her daughter passed away 3 years ago and she still has difficulty sleeping and dealing with this.  She would like to try something to help with her sleep.  She has not had any palpitations but she does have chronic cough with some shortness of breath which she is followed by pulmonary.  They think that she may have MAI infection which is still being worked up.  I did review the pulmonary and infectious disease note.  She may be proceeding with bronchoscopy for confirmation diagnosis before having to go on antibiotics for almost a year to treat this.    No known thyroid disorder in the family   she has been gaining weight, now 140lbs  she does treadmill in the evening  even when she exercises feels puffy, retaining fluid  Note in the past she did try clonazepam for sleep this caused significant drowsiness            Review Of Systems:  GEN- denies fatigue, fever, weight loss,weakness, recent illness HEENT- denies eye drainage, change in vision, nasal discharge, CVS- denies chest pain, palpitations RESP- denies SOB, cough, wheeze ABD- denies N/V, change in stools, abd pain GU- denies dysuria, hematuria, dribbling, incontinence MSK- denies joint pain, muscle aches, injury Neuro- denies headache, dizziness, syncope, seizure activity       Objective:    BP 132/68   Pulse 68   Temp 98.3 F (36.8 C) (Oral)   Resp 14   Ht 5\' 4"  (1.626 m)   Wt 140 lb (63.5 kg)   SpO2 97%   BMI 24.03 kg/m  GEN- NAD, alert and oriented x3 HEENT- PERRL, EOMI, non  injected sclera, pink conjunctiva, MMM, oropharynx clear Neck- Supple, no thyromegaly CVS- RRR, no murmur RESP-CTAB Psych- Tearful, not axious appearing, no SI well groomed  EXT- No edema Pulses- Radial 2+        Assessment & Plan:      Problem List Items Addressed This Visit      Unprioritized   CAD (coronary artery disease) - Primary    She has an appoint with cardiology tomorrow.  She has not had any chest pains or recent angina symptoms.  Her other labs are fairly unremarkable.  Her abnormal TSH could just be transmitted it could also be a sign of stress in the setting of this possible MAI infection that pulmonary is currently working up.  Today Minna repeat her thyroid function along with the T3 and T4 to see if she does have true underlying disorder. Not start any medications at this time.  If it is still borderline I will actually repeat her levels in 6 to 8 weeks.  Brought in the chronic insomnia and stress we discussed grief counseling but she wants to hold off at this time.  We will go ahead and start her on trazodone 25 to 50 mg at bedtime to see if this helps with sleep and her overall mood.      Chronic insomnia  COPD (chronic obstructive pulmonary disease) ([place])   Depression    Per above start trazodone      Relevant Medications   traZODone (DESYREL) 50 MG tablet   Hyperlipidemia    Other Visit Diagnoses    Abnormal TSH       Relevant Orders   T3, free   TSH   T4, free      Note: This dictation was prepared with Dragon dictation along with smaller phrase technology. Any transcriptional errors that result from this process are unintentional.

## 2019-01-07 NOTE — Patient Instructions (Signed)
F/U 3 weeks TELEPHONE VISIT for medications

## 2019-01-08 ENCOUNTER — Encounter: Payer: Self-pay | Admitting: Cardiology

## 2019-01-08 ENCOUNTER — Ambulatory Visit (INDEPENDENT_AMBULATORY_CARE_PROVIDER_SITE_OTHER): Payer: Medicare Other | Admitting: Cardiology

## 2019-01-08 VITALS — BP 122/74 | HR 67 | Ht 64.0 in | Wt 140.4 lb

## 2019-01-08 DIAGNOSIS — I712 Thoracic aortic aneurysm, without rupture, unspecified: Secondary | ICD-10-CM

## 2019-01-08 DIAGNOSIS — I2510 Atherosclerotic heart disease of native coronary artery without angina pectoris: Secondary | ICD-10-CM

## 2019-01-08 DIAGNOSIS — E785 Hyperlipidemia, unspecified: Secondary | ICD-10-CM

## 2019-01-08 MED ORDER — FUROSEMIDE 20 MG PO TABS: 20.0000 mg | ORAL_TABLET | Freq: Every day | ORAL | 6 refills | Status: DC | PRN

## 2019-01-08 NOTE — Patient Instructions (Addendum)
Medication Instructions:   START TAKING FUROSEMIDE (LASIX) 20 MG BY MOUTH DAILY AS NEEDED FOR LOWER EXTREMITY SWELLING  If you need a refill on your cardiac medications before your next appointment, please call your pharmacy.     Follow-Up: At [facility], you and your health needs are our priority.  As part of our continuing mission to provide you with exceptional heart care, we have created designated Provider Care Teams.  These Care Teams include your primary Cardiologist (physician) and Advanced Practice Providers (APPs -  Physician Assistants and Nurse Practitioners) who all work together to provide you with the care you need, when you need it. You will need a follow up appointment in 12 months.  Please call our office 2 months in advance to schedule this appointment.  You may see Ena Dawley, MD or one of the following Advanced Practice Providers on your designated Care Team:   Melina Copa, [place] . Ermalinda Barrios, PA-C

## 2019-01-08 NOTE — Progress Notes (Signed)
Cardiology Office Note:    Date:  01/10/2019   ID:  [name], DOB 02-04-1956, MRN [id number]  PCP:  Alycia Rossetti, MD  Cardiologist:  Ena Dawley, MD  Referring MD: Alycia Rossetti, MD   Reason for visit: 6 months follow-up, lower extremity edema  History of Present Illness:    Katrina Campbell is a 63 y.o. female with a past medical history significant for coronary calcification and mildly dilated thoracic aortic aneurysm on CT.  Nuclear stress test, 06/2016,  with excellent functional capacity, no ischemia and normal LVEF.  She also has hyperlipidemia, hypertension, depression and COPD.  She was admitted to the hospital on 11/06/2017 with complaints of sudden onset of chest pain and dyspnea approximately 10 minutes after eating a carrot.  She was given nitroglycerin sublingually and apparently had about 30 seconds of syncope afterward.  Her systolic blood pressure dropped from 158 to 60.  She had a mild troponin elevation of 0.07 and a flat trend.  She underwent left heart cath on 11/07/2017 that showed mild nonobstructive CAD with 20% stenosis of her LAD and normal LV function.  She is being treated with risk factor modification, aspirin and statin.  Katrina Campbell is here today alone for hospital follow up. She had developed pain and swelling of her right arm above the cath site.  Ultrasound of the right arm was done showing no evidence of pseudoaneurysm or AV fistula.  Another study was done later to check for DVT which was negative and the patient was advised to use warm compress.  Her arm is better. She still has a very small residual knot in her upper medial arm. Right radial cath site is healed. BP was a little high in the hospital. She kept a record at home as advised. BP 110's-120's/70's-80's. She increased her crestor to every day. She is having trouble sleeping but no pain. She wonders if the Crestor is causing this.   01/08/2019 -this is 1 year follow-up, the patient denies any  further chest pain shortness of breath, she exercises and feels well.  She has on and off lower extremity edema that improved by next morning.   Past Medical History:  Diagnosis Date  . Anxiety   . COPD (chronic obstructive pulmonary disease) ([place])   . Depression   . Hyperlipidemia   . Labile hypertension   . 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 LEFT HEART CATH AND CORONARY ANGIOGRAPHY N/A 11/07/2017   Procedure: LEFT HEART CATH AND CORONARY ANGIOGRAPHY;  Surgeon: Nelva Bush, MD;  Location: [facility];  Service: Cardiovascular;  Laterality: N/A;  . NASAL SINUS SURGERY  1986    Current Medications: Current Meds  Medication Sig  . albuterol (PROAIR HFA) 108 (90 BASE) MCG/ACT inhaler Inhale 2 puffs into the lungs every 6 (six) hours as needed for wheezing or shortness of breath.  Marland Kitchen aspirin EC 81 MG tablet Take 81 mg by mouth daily.  Marland Kitchen buPROPion (WELLBUTRIN XL) 300 MG 24 hr tablet Take 300 mg by mouth daily.  . Calcium Carb-Cholecalciferol (CALCIUM-VITAMIN D) 500-400 MG-UNIT TABS 1 a day  . fluticasone (FLONASE) 50 MCG/ACT nasal spray Place 2 sprays into both nostrils as needed for allergies or rhinitis.  . Fluticasone-Umeclidin-Vilant (TRELEGY ELLIPTA) 100-62.5-25 MCG/INH AEPB Inhale 1 puff into the lungs daily.  Marland Kitchen guaiFENesin (MUCINEX) 600 MG 12 hr tablet Take 1,200 mg by mouth 2 (two) times daily as needed  for to loosen phlegm.   Marland Kitchen ibuprofen (ADVIL) 200 MG tablet Take 400 mg by mouth every 8 (eight) hours as needed for fever.  Marland Kitchen MAGNESIUM PO Take 1 tablet by mouth daily.   . Omega-3 Fatty Acids (FISH OIL PO) Take 500 mg by mouth daily.  . traZODone (DESYREL) 50 MG tablet Take 0.5-1 tablets (25-50 mg total) by mouth at bedtime as needed for sleep.  . vitamin C (ASCORBIC ACID) 500 MG tablet Take 500 mg by mouth every morning.  . [DISCONTINUED] rosuvastatin (CRESTOR) 10 MG tablet Take 1 tablet (10 mg  total) by mouth daily at 6 PM.     Allergies:   Patient has no known allergies.   Social History   Socioeconomic History  . Marital status: Married    Spouse name: Not on file  . Number of children: Not on file  . Years of education: Not on file  . Highest education level: Not on file  Occupational History  . Not on file  Social Needs  . Financial resource strain: Not on file  . Food insecurity    Worry: Not on file    Inability: Not on file  . Transportation needs    Medical: Not on file    Non-medical: Not on file  Tobacco Use  . Smoking status: Former Smoker    Packs/day: 1.00    Years: 40.00    Pack years: 40.00    Types: Cigarettes    Quit date: 11/09/2012    Years since quitting: 6.1  . Smokeless tobacco: Never Used  . Tobacco comment: vapor  Substance and Sexual Activity  . Alcohol use: Yes    Alcohol/week: 24.0 standard drinks    Types: 24 Cans of beer per week  . Drug use: No  . Sexual activity: Not Currently  Lifestyle  . Physical activity    Days per week: Not on file    Minutes per session: Not on file  . Stress: Not on file  Relationships  . Social Herbalist on phone: Not on file    Gets together: Not on file    Attends religious service: Not on file    Active member of club or organization: Not on file    Attends meetings of clubs or organizations: Not on file    Relationship status: Not on file  Other Topics Concern  . Not on file  Social History Narrative  . Not on file     Family History: The patient's family history includes Asthma in an other family member; Congestive Heart Failure in her father and mother; Emphysema in her mother; Heart attack in her father and mother; Lung disease in her brother and sister. There is no history of Colon cancer. ROS:   Please see the history of present illness.     All other systems reviewed and are negative.  EKGs/Labs/Other Studies Reviewed:    The following studies were reviewed today:   LEFT HEART CATH AND CORONARY ANGIOGRAPHY 11/07/2017  Conclusion   Conclusions: 1. Eccentric calcification with mild stenosis of the ostial LAD (~20%).  Otherwise, no angiographically significant coronary artery disease. 2. Normal left ventricular systolic function and filling pressure.  Recommendations: 1. Continue medical therapy and risk factor modification to prevent progression of CAD. 2. Monitor overnight to ensure the patient does not have recurrent chest pain.  Anticipate discharge home tomorrow morning.  Recommend Aspirin 81mg  daily for mild CAD and thoracic aortic aneurysm.  CT 05/2016 IMPRESSION: 1. Resolution of previously described nodule along the right minor fissure. 2. Advanced emphysema, without acute superimposed process. 3. Age advanced coronary artery atherosclerosis. Recommend assessment of coronary risk factors and consideration of medical therapy. 4. Borderline to mild ascending aortic aneurysm, unchanged. Recommend annual imaging followup by CTA or MRA. This recommendation follows 2010 ACCF/AHA/AATS/ACR/ASA/SCA/SCAI/SIR/STS/SVM Guidelines for the Diagnosis and Management of Patients with Thoracic Aortic Disease. Circulation. 2010; 121: Q683-M196   Nuclear stress test 06/2016 Study Highlights    Nuclear stress EF: 58%.  Blood pressure demonstrated a normal response to exercise.  There was no ST segment deviation noted during stress.  Defect 1: There is a small defect of moderate severity present in the apical anterior and apex location.  The study is normal.  This is a low risk study.  The left ventricular ejection fraction is normal (55-65%).  Normal stress nuclear study with breast attenuation but no ischemia; EF 58 with normal wall motion.     EKG:  EKG is not ordered today.    Recent Labs: 01/02/2019: ALT 27; BUN 15; Creatinine, Ser 0.88; Hemoglobin 14.5; Platelets 154; Potassium 4.3; Sodium 139 01/07/2019: TSH 2.63   Recent  Lipid Panel    Component Value Date/Time   CHOL 173 01/02/2019 1005   TRIG 87 01/02/2019 1005   HDL 67 01/02/2019 1005   CHOLHDL 2.6 01/02/2019 1005   CHOLHDL 3.4 02/21/2018 0845   VLDL 38 (H) 12/30/2015 1536   LDLCALC 90 01/02/2019 1005   LDLCALC 83 02/21/2018 0845    Physical Exam:    VS:  BP 122/74   Pulse 67   Ht 5\' 4"  (1.626 m)   Wt 140 lb 6.4 oz (63.7 kg)   SpO2 97%   BMI 24.10 kg/m     Wt Readings from Last 3 Encounters:  01/08/19 140 lb 6.4 oz (63.7 kg)  01/07/19 140 lb (63.5 kg)  11/02/18 136 lb 9.6 oz (62 kg)     Physical Exam  Constitutional: She is oriented to person, place, and time. She appears well-developed and well-nourished. No distress.  HENT:  Head: Normocephalic and atraumatic.  Neck: Normal range of motion. Neck supple. No JVD present.  Cardiovascular: Normal rate, regular rhythm, normal heart sounds and intact distal pulses. Exam reveals no gallop and no friction rub.  No murmur heard. Pulmonary/Chest: Effort normal and breath sounds normal. No respiratory distress. She has no wheezes. She has no rales.  Abdominal: Soft. Bowel sounds are normal.  Musculoskeletal: Normal range of motion.        General: No deformity or edema.  Neurological: She is alert and oriented to person, place, and time.  Skin: Skin is warm and dry.  Psychiatric: She has a normal mood and affect. Her behavior is normal. Judgment and thought content normal.  Vitals reviewed.   ASSESSMENT:    1. Coronary artery disease involving native coronary artery of native heart without angina pectoris   2. Hyperlipidemia, unspecified hyperlipidemia type   3. Thoracic aortic aneurysm without rupture (HCC)    PLAN:    In order of problems listed above:  CAD: Mild nonobstructive CAD by cardiac catheterization on 11/07/2017.  Advised on risk factor modification, aspirin and statin.  EKG today is normal and unchanged.  No need for ischemic work-up.  Hyperlipidemia: LDL was 107 on  10/03/2017.  Her Crestor has been increased from 10 mg 3 times a week to 10 mg daily.   Upper normal size of the ascending aorta: Stable  on CTA 11/07/2017, 3.8 cm.  Continue routine monitoring.  Pulmonary nodules: Incidentally noted on CT in 2019, follow-up scan in May 2020 showed decrease in size, most compatible with inflammatory/infectious process. Additional surveillance CT is reasonable at 18-24 months after the initial CT. Dr. Lenna Gilford is following.   Intermittent lower extremity edema -we will prescribe Lasix 20 mg daily p.o. as needed  Medication Adjustments/Labs and Tests Ordered: Current medicines are reviewed at length with the patient today.  Concerns regarding medicines are outlined above. Labs and tests ordered and medication changes are outlined in the patient instructions below:  Patient Instructions  Medication Instructions:   START TAKING FUROSEMIDE (LASIX) 20 MG BY MOUTH DAILY AS NEEDED FOR LOWER EXTREMITY SWELLING  If you need a refill on your cardiac medications before your next appointment, please call your pharmacy.     Follow-Up: At [facility], you and your health needs are our priority.  As part of our continuing mission to provide you with exceptional heart care, we have created designated Provider Care Teams.  These Care Teams include your primary Cardiologist (physician) and Advanced Practice Providers (APPs -  Physician Assistants and Nurse Practitioners) who all work together to provide you with the care you need, when you need it. You will need a follow up appointment in 12 months.  Please call our office 2 months in advance to schedule this appointment.  You may see Ena Dawley, MD or one of the following Advanced Practice Providers on your designated Care Team:   Melina Copa, [place] . Ermalinda Barrios, PA-C        Signed, Ena Dawley, MD  01/10/2019 7:21 AM    [place]

## 2019-01-09 ENCOUNTER — Other Ambulatory Visit: Payer: Self-pay | Admitting: Cardiology

## 2019-01-09 DIAGNOSIS — R0600 Dyspnea, unspecified: Secondary | ICD-10-CM

## 2019-01-09 DIAGNOSIS — E785 Hyperlipidemia, unspecified: Secondary | ICD-10-CM

## 2019-01-09 DIAGNOSIS — R0609 Other forms of dyspnea: Secondary | ICD-10-CM

## 2019-01-09 DIAGNOSIS — I700 Atherosclerosis of aorta: Secondary | ICD-10-CM

## 2019-01-09 DIAGNOSIS — R9389 Abnormal findings on diagnostic imaging of other specified body structures: Secondary | ICD-10-CM

## 2019-01-11 ENCOUNTER — Telehealth: Payer: Self-pay | Admitting: *Deleted

## 2019-01-11 NOTE — Telephone Encounter (Signed)
Received call from patient.   Reports that she was seen in office on 01/07/2019. States that she did discuss her chronic cough with MD at that time. States that since Monday, cough has worsened and become dry and hacking. Denies SOB or production of mucus, though states that she takes Mucinex daily. Noted cough while on phone and voice quality hoarse. Reports that she is worried that she has developed laryngitis.   MD please advise.

## 2019-01-11 NOTE — Telephone Encounter (Signed)
Call placed to patient and patient made aware.  

## 2019-01-11 NOTE — Telephone Encounter (Signed)
Her chronic cough is being managed by pulmonary they are working up some chronic infection Laryngitis can come from post nasal drainage or the continous cough due to inflammation She can try magic mouthwash if there is any sore throat, otherwise her regular meds for her COPD/allergies, salt water gargle, nasal saline Throat lozenges  Advise her antibiotics are not typically needed for laryngitis  She can also call them for recommendations if the above does not help

## 2019-01-15 ENCOUNTER — Encounter: Payer: Self-pay | Admitting: Pulmonary Disease

## 2019-01-15 ENCOUNTER — Telehealth: Payer: Self-pay

## 2019-01-15 ENCOUNTER — Ambulatory Visit (INDEPENDENT_AMBULATORY_CARE_PROVIDER_SITE_OTHER): Payer: Medicare Other | Admitting: Pulmonary Disease

## 2019-01-15 ENCOUNTER — Other Ambulatory Visit: Payer: Self-pay

## 2019-01-15 VITALS — BP 138/70 | HR 62 | Temp 98.0°F | Ht 64.0 in | Wt 139.0 lb

## 2019-01-15 DIAGNOSIS — I2510 Atherosclerotic heart disease of native coronary artery without angina pectoris: Secondary | ICD-10-CM

## 2019-01-15 DIAGNOSIS — J449 Chronic obstructive pulmonary disease, unspecified: Secondary | ICD-10-CM | POA: Diagnosis not present

## 2019-01-15 MED ORDER — FLUTTER DEVI: 0 refills | Status: DC

## 2019-01-15 MED ORDER — PANTOPRAZOLE SODIUM 40 MG PO TBEC: 40.0000 mg | DELAYED_RELEASE_TABLET | Freq: Every day | ORAL | 1 refills | Status: DC

## 2019-01-15 MED ORDER — CHLORPHENIRAMINE MALEATE 4 MG PO TABS: 8.0000 mg | ORAL_TABLET | Freq: Three times a day (TID) | ORAL | 1 refills | Status: DC | PRN

## 2019-01-15 NOTE — Telephone Encounter (Signed)
LMTCB x1 to make patient aware that Dr. Vaughan Browner has scheduled her for a Bronch at [facility] on 01/29/19 @8 :30am. Patient will need to arrive 2 hours prior and have Covid testing the week before and someone will call to schedule that.

## 2019-01-15 NOTE — Telephone Encounter (Signed)
Covid testing scheduled. Nothing further needed at this time.

## 2019-01-15 NOTE — Progress Notes (Signed)
Katrina Campbell    [id number]    1955/06/29  Primary Care Physician:Brightwood, Modena Nunnery, MD  Referring Physician: Alycia Rossetti, MD [address] [place],  [place] [address]  Chief complaint: Follow-up for COPD, pulmonary nodules  HPI: 63 year old with history of COPD, lung nodules, hyperlipidemia.  Previously followed by Dr. Belva Chimes of ongoing issues with cough since February 2020.  She had sputum culture which showed stenotrophomonas. Referred to ID.  She was treated Bactrim with no improvement in symptoms.  The stenotrophomonas is thought to be a colonization.  She also had 3 sputum cultures for AFB out of concern for MAI given findings of pulmonary nodules.  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  ID had discussed starting MAI therapy but patient is reluctant and wants a second opinion.  Currently maintained on Advair and Incruse for many years.  She is taking over-the-counter Zyrtec and Mucinex for cough which is helping some.  Denies any fevers, chills, loss of weight, loss of appetite.  Pets: Has a dog.  No cats, birds, farm animals Occupation: Retired Regulatory affairs officer Exposures: No known exposures.  No mold, hot tub, Jacuzzi Smoking history: 40-pack-year smoker.  Quit in 2014 Travel history: Previously lived in [place].  No significant recent travel Relevant family history: Dad had emphysema.  He was a smoker.   Interim history: Continues to have chronic cough, mostly nonproductive in nature Continues on Trelegy inhaler  Outpatient Encounter Medications as of 01/15/2019  Medication Sig  . albuterol (PROAIR HFA) 108 (90 BASE) MCG/ACT inhaler Inhale 2 puffs into the lungs every 6 (six) hours as needed for wheezing or shortness of breath.  Marland Kitchen aspirin EC 81 MG tablet Take 81 mg by mouth daily.  Marland Kitchen buPROPion (WELLBUTRIN XL) 300 MG 24 hr tablet Take 300 mg by mouth daily.  . Calcium Carb-Cholecalciferol (CALCIUM-VITAMIN D) 500-400 MG-UNIT TABS 1 a day  .  fluticasone (FLONASE) 50 MCG/ACT nasal spray Place 2 sprays into both nostrils as needed for allergies or rhinitis.  . Fluticasone-Umeclidin-Vilant (TRELEGY ELLIPTA) 100-62.5-25 MCG/INH AEPB Inhale 1 puff into the lungs daily.  . furosemide (LASIX) 20 MG tablet Take 1 tablet (20 mg total) by mouth daily as needed for edema (Swelling).  Marland Kitchen guaiFENesin (MUCINEX) 600 MG 12 hr tablet Take 1,200 mg by mouth 2 (two) times daily as needed for to loosen phlegm.   Marland Kitchen ibuprofen (ADVIL) 200 MG tablet Take 400 mg by mouth every 8 (eight) hours as needed for fever.  Marland Kitchen MAGNESIUM PO Take 1 tablet by mouth daily.   . Omega-3 Fatty Acids (FISH OIL PO) Take 500 mg by mouth daily.  . rosuvastatin (CRESTOR) 10 MG tablet TAKE 1 TABLET DAILY AT 6 P.M.  . traZODone (DESYREL) 50 MG tablet Take 0.5-1 tablets (25-50 mg total) by mouth at bedtime as needed for sleep.  . vitamin C (ASCORBIC ACID) 500 MG tablet Take 500 mg by mouth every morning.   No facility-administered encounter medications on file as of 01/15/2019.    Physical Exam: Blood pressure 138/70, pulse 62, temperature 98 F (36.7 C), temperature source Temporal, height 5\' 4"  (1.626 m), weight 139 lb (63 kg), SpO2 98 %. Gen:      No acute distress HEENT:  EOMI, sclera anicteric Neck:     No masses; no thyromegaly Lungs:    Clear to auscultation bilaterally; normal respiratory effort CV:         Regular rate and  rhythm; no murmurs Abd:      + bowel sounds; soft, non-tender; no palpable masses, no distension Ext:    No edema; adequate peripheral perfusion Skin:      Warm and dry; no rash Neuro: alert and oriented x 3 Psych: normal mood and affect  Data Reviewed: Imaging: CT chest 05/21/2015- 4 mm nodule in the minor fissure.  Emphysema. CT chest 05/31/2016- resolution of pulmonary nodule.  Emphysema.   CT chest 11/07/2017- nodular densities in the right upper lobe.  5 mm nodule in the left lower lobe. Emphysema.  Atelectasis in the right lower lobe and  lingula. CT chest 02/22/2018- previously described lung nodules have resolved.  New 8 mm left apical lung nodule.  Emphysema CT chest 08/10/2018- improving size of left upper lobe nodule.  Emphysema. I have reviewed the images personally.  PFTs: Spirometry 02/10/2015 FVC 3.0 [97%], FEV1 1.6 [6 6%],F/F 54 Moderate obstruction.  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  Follow-up repeat imaging in 1 year Reasonable to consider MAI infection especially as her pulmonary nodules have waxed and waned with sputum growing MAI, however CT scan findings are not very typical of this.  We had held off bronchoscopy earlier this year after discussion with the patient- but given ongoing symptoms she would like to proceed Risk benefits discussed with patient and she is agreeable.   More then 1/2 the time of the 40 min visit was spent in counseling and/or coordination of care with the patient and family.  Plan/Recommendations: - Continue Trelegy - Mucinex, flutter valve - Flonase, chlorphentermine, Protonix - Schedule bronchoscopy  Marshell Garfinkel MD  Pulmonary and Critical Care 01/15/2019, 11:09 AM  CC: Katrina Campbell, Modena Nunnery, MD

## 2019-01-15 NOTE — Telephone Encounter (Signed)
I spoke with the patient and made her aware of the time and location for her Bronch. She verbalized understanding and will be there 2 hours before. She has been made aware that she will get a call for Covid testing.

## 2019-01-15 NOTE — Addendum Note (Signed)
Addended by: Hildred Alamin I on: 01/15/2019 03:13 PM   Modules accepted: Orders

## 2019-01-15 NOTE — Patient Instructions (Signed)
Continue the Flonase We will start you on chlorpheniramine 8 mg 3 times daily.  This medication is available over-the-counter as well We will also started on Protonix 40 mg a day. Continue the Trelegy inhaler, Mucinex We will give you a flutter valve for clearance of secretion  We will schedule you for a bronchoscope.  I will get back in touch with you with the date after this has been scheduled Follow-up in 1-2 months.

## 2019-01-18 ENCOUNTER — Telehealth: Payer: Self-pay | Admitting: Pulmonary Disease

## 2019-01-18 NOTE — Telephone Encounter (Signed)
Spoke with pt. She is questioning if her husband could be tested for COVID when she goes for her preprocedure testing. Advised her that if her husband is not a pt of ours, we can't order the test. Pt will contact her husband's PCP and see if they would be willing to order this test. Nothing further was needed.

## 2019-01-23 ENCOUNTER — Ambulatory Visit: Payer: Medicare Other | Admitting: Pulmonary Disease

## 2019-01-26 ENCOUNTER — Other Ambulatory Visit (HOSPITAL_COMMUNITY)
Admission: RE | Admit: 2019-01-26 | Discharge: 2019-01-26 | Disposition: A | Discharge status: Home / Self Care | Payer: Medicare Other | Source: Ambulatory Visit | Attending: Pulmonary Disease | Admitting: Pulmonary Disease

## 2019-01-26 DIAGNOSIS — Z20828 Contact with and (suspected) exposure to other viral communicable diseases: Secondary | ICD-10-CM | POA: Insufficient documentation

## 2019-01-26 DIAGNOSIS — Z01812 Encounter for preprocedural laboratory examination: Secondary | ICD-10-CM | POA: Insufficient documentation

## 2019-01-27 LAB — NOVEL CORONAVIRUS, NAA (HOSP ORDER, SEND-OUT TO REF LAB; TAT 18-24 HRS): SARS-CoV-2, NAA: NOT DETECTED

## 2019-01-28 ENCOUNTER — Ambulatory Visit (INDEPENDENT_AMBULATORY_CARE_PROVIDER_SITE_OTHER): Payer: Medicare Other | Admitting: Family Medicine

## 2019-01-28 ENCOUNTER — Encounter: Payer: Self-pay | Admitting: Family Medicine

## 2019-01-28 ENCOUNTER — Other Ambulatory Visit: Payer: Self-pay

## 2019-01-28 DIAGNOSIS — I2510 Atherosclerotic heart disease of native coronary artery without angina pectoris: Secondary | ICD-10-CM | POA: Diagnosis not present

## 2019-01-28 DIAGNOSIS — F320 Major depressive disorder, single episode, mild: Secondary | ICD-10-CM

## 2019-01-28 DIAGNOSIS — F5104 Psychophysiologic insomnia: Secondary | ICD-10-CM

## 2019-01-28 DIAGNOSIS — R7989 Other specified abnormal findings of blood chemistry: Secondary | ICD-10-CM | POA: Diagnosis not present

## 2019-01-28 NOTE — Progress Notes (Signed)
Virtual Visit via Telephone Note  I connected with Katrina Campbell on 01/28/19 at 11:50am by telephone and verified that I am speaking with the correct person using two identifiers.       Pt location: at home   Physician location:  In office, [facility], Vic Blackbird MD     On call: patient and physician   I discussed the limitations, risks, security and privacy concerns of performing an evaluation and management service by telephone and the availability of in person appointments. I also discussed with the patient that there may be a patient responsible charge related to this service. The patient expressed understanding and agreed to proceed.   History of Present Illness: Telephone visit to follow-up her recent visit for medications.  Pt scheduled for video bronchoscopy by pulmonary for her chronic cough concern for the atypical infection.  She was also recently given Protonix by pulmonary for possible reflux contributing but she has not seen any change in the symptoms.    MDD/ Chronic insomnia- she was started on trazodone,  She took 25mg  one night then 50mg  the next nightbut the cough kept her up to much .  He is willing to try for a longer period of time.  Abnormal TSH-did have repeat TSH along with T3 and T4 all were normal.  Recommend that we repeat these in another 3 to 4 months to ensure that they are staying normal.    Observations/Objective: NAD noted on phone, has mild hoarse voice, occ cough on phone  Assessment and Plan: Chronic insomnia/depression - after procedure will try the trazodone take 50 mg at bedtime daily for at least 2 weeks to see if she notes a change in her mood.  She will contact me for any concerns regarding the medication.  F/U Thyroid in January with lab visit only   She also asked about her weight.  She was given a diuretic by cardiology.  States that at times she does feel puffy in the hands and lower extremities.  She took the Lasix 1  time.  Advised her that she can take 2 to 3 days in a row to see if she gets any fluid off and then return to using as needed. Follow Up Instructions:    I discussed the assessment and treatment plan with the patient. The patient was provided an opportunity to ask questions and all were answered. The patient agreed with the plan and demonstrated an understanding of the instructions.   The patient was advised to call back or seek an in-person evaluation if the symptoms worsen or if the condition fails to improve as anticipated.  I provided 10 minutes of non-face-to-face time during this encounter. End Time 12:05pm  Vic Blackbird, MD

## 2019-01-29 ENCOUNTER — Encounter (HOSPITAL_COMMUNITY): Payer: Self-pay | Admitting: Respiratory Therapy

## 2019-01-29 ENCOUNTER — Encounter (HOSPITAL_COMMUNITY)
Admission: RE | Disposition: A | Discharge status: Home / Self Care | Payer: Self-pay | Source: Home / Self Care | Attending: Pulmonary Disease

## 2019-01-29 ENCOUNTER — Ambulatory Visit (HOSPITAL_COMMUNITY)
Admission: RE | Admit: 2019-01-29 | Discharge: 2019-01-29 | Disposition: A | Discharge status: Home / Self Care | Payer: Medicare Other | Source: Ambulatory Visit | Attending: Pulmonary Disease | Admitting: Pulmonary Disease

## 2019-01-29 ENCOUNTER — Ambulatory Visit (HOSPITAL_COMMUNITY)
Admission: RE | Admit: 2019-01-29 | Discharge: 2019-01-29 | Disposition: A | Discharge status: Home / Self Care | Payer: Medicare Other | Attending: Pulmonary Disease | Admitting: Pulmonary Disease

## 2019-01-29 ENCOUNTER — Ambulatory Visit (HOSPITAL_COMMUNITY): Payer: Medicare Other

## 2019-01-29 DIAGNOSIS — Z79899 Other long term (current) drug therapy: Secondary | ICD-10-CM | POA: Diagnosis not present

## 2019-01-29 DIAGNOSIS — R05 Cough: Secondary | ICD-10-CM | POA: Diagnosis not present

## 2019-01-29 DIAGNOSIS — Z9889 Other specified postprocedural states: Secondary | ICD-10-CM

## 2019-01-29 DIAGNOSIS — J439 Emphysema, unspecified: Secondary | ICD-10-CM | POA: Insufficient documentation

## 2019-01-29 DIAGNOSIS — E785 Hyperlipidemia, unspecified: Secondary | ICD-10-CM | POA: Diagnosis not present

## 2019-01-29 DIAGNOSIS — Z87891 Personal history of nicotine dependence: Secondary | ICD-10-CM | POA: Diagnosis not present

## 2019-01-29 DIAGNOSIS — R918 Other nonspecific abnormal finding of lung field: Secondary | ICD-10-CM | POA: Insufficient documentation

## 2019-01-29 DIAGNOSIS — Z7982 Long term (current) use of aspirin: Secondary | ICD-10-CM | POA: Diagnosis not present

## 2019-01-29 DIAGNOSIS — R059 Cough, unspecified: Secondary | ICD-10-CM

## 2019-01-29 HISTORY — PX: VIDEO BRONCHOSCOPY: SHX5072

## 2019-01-29 LAB — BODY FLUID CELL COUNT WITH DIFFERENTIAL
Eos, Fluid: 2 %
Lymphs, Fluid: 4 %
Monocyte-Macrophage-Serous Fluid: 24 % — ABNORMAL LOW (ref 50–90)
Neutrophil Count, Fluid: 70 % — ABNORMAL HIGH (ref 0–25)
Total Nucleated Cell Count, Fluid: 37 cu mm (ref 0–1000)

## 2019-01-29 SURGERY — BRONCHOSCOPY, WITH FLUOROSCOPY
Anesthesia: Moderate Sedation | Laterality: Bilateral

## 2019-01-29 MED ORDER — LIDOCAINE HCL URETHRAL/MUCOSAL 2 % EX GEL: 1.0000 "application " | Freq: Once | CUTANEOUS | Status: DC

## 2019-01-29 MED ORDER — MIDAZOLAM HCL (PF) 5 MG/ML IJ SOLN
INTRAMUSCULAR | Status: AC
  Filled 2019-01-29: qty 2

## 2019-01-29 MED ORDER — LIDOCAINE HCL URETHRAL/MUCOSAL 2 % EX GEL
CUTANEOUS | Status: DC | PRN
  Administered 2019-01-29: 1

## 2019-01-29 MED ORDER — FENTANYL CITRATE (PF) 100 MCG/2ML IJ SOLN
INTRAMUSCULAR | Status: DC | PRN
  Administered 2019-01-29 (×4): 25 ug via INTRAVENOUS

## 2019-01-29 MED ORDER — PHENYLEPHRINE HCL 0.25 % NA SOLN: 1.0000 | Freq: Four times a day (QID) | NASAL | Status: DC | PRN

## 2019-01-29 MED ORDER — PHENYLEPHRINE HCL 0.25 % NA SOLN
NASAL | Status: DC | PRN
  Administered 2019-01-29: 2 via NASAL

## 2019-01-29 MED ORDER — LIDOCAINE HCL 1 % IJ SOLN
INTRAMUSCULAR | Status: DC | PRN
  Administered 2019-01-29: 6 mL via RESPIRATORY_TRACT

## 2019-01-29 MED ORDER — SODIUM CHLORIDE 0.9 % IV SOLN
INTRAVENOUS | Status: DC
  Administered 2019-01-29: 08:00:00 via INTRAVENOUS

## 2019-01-29 MED ORDER — MIDAZOLAM HCL (PF) 10 MG/2ML IJ SOLN
INTRAMUSCULAR | Status: DC | PRN
  Administered 2019-01-29 (×4): 1 mg via INTRAVENOUS

## 2019-01-29 MED ORDER — FENTANYL CITRATE (PF) 100 MCG/2ML IJ SOLN
INTRAMUSCULAR | Status: AC
  Filled 2019-01-29: qty 4

## 2019-01-29 MED ORDER — BUTAMBEN-TETRACAINE-BENZOCAINE 2-2-14 % EX AERO: 1.0000 | INHALATION_SPRAY | Freq: Once | CUTANEOUS | Status: DC

## 2019-01-29 NOTE — H&P (Signed)
Katrina Campbell    [id number]    28-Apr-1955  Primary Care Physician:[place], Modena Nunnery, MD  Referring Physician: No referring provider defined for this encounter.  Chief complaint: Follow-up for COPD, pulmonary nodules  HPI: 63 year old with history of COPD, lung nodules, hyperlipidemia.  Previously followed by Dr. Belva Chimes of ongoing issues with cough since February 2020.  She had sputum culture which showed stenotrophomonas. Referred to ID.  She was treated Bactrim with no improvement in symptoms.  The stenotrophomonas is thought to be a colonization.  She also had 3 sputum cultures for AFB out of concern for MAI given findings of pulmonary nodules.  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  ID had discussed starting MAI therapy but patient is reluctant and wants a second opinion.  Currently maintained on Advair and Incruse for many years.  She is taking over-the-counter Zyrtec and Mucinex for cough which is helping some.  Denies any fevers, chills, loss of weight, loss of appetite.  Pets: Has a dog.  No cats, birds, farm animals Occupation: Retired Regulatory affairs officer Exposures: No known exposures.  No mold, hot tub, Jacuzzi Smoking history: 40-pack-year smoker.  Quit in 2014 Travel history: Previously lived in [place].  No significant recent travel Relevant family history: Dad had emphysema.  He was a smoker.   Interim history: Patient presents for bronchoscopy.  Continues on Trelegy inhaler Chronic cough is stable at baseline.  No fevers or chills.  Outpatient Encounter Medications as of 01/15/2019  Medication Sig  . albuterol (PROAIR HFA) 108 (90 BASE) MCG/ACT inhaler Inhale 2 puffs into the lungs every 6 (six) hours as needed for wheezing or shortness of breath.  Marland Kitchen aspirin EC 81 MG tablet Take 81 mg by mouth daily.  Marland Kitchen buPROPion (WELLBUTRIN XL) 300 MG 24 hr tablet Take 300 mg by mouth daily.  . Calcium Carb-Cholecalciferol (CALCIUM-VITAMIN D) 500-400 MG-UNIT TABS  1 a day  . fluticasone (FLONASE) 50 MCG/ACT nasal spray Place 2 sprays into both nostrils as needed for allergies or rhinitis.  . Fluticasone-Umeclidin-Vilant (TRELEGY ELLIPTA) 100-62.5-25 MCG/INH AEPB Inhale 1 puff into the lungs daily.  . furosemide (LASIX) 20 MG tablet Take 1 tablet (20 mg total) by mouth daily as needed for edema (Swelling).  Marland Kitchen guaiFENesin (MUCINEX) 600 MG 12 hr tablet Take 1,200 mg by mouth 2 (two) times daily as needed for to loosen phlegm.   Marland Kitchen ibuprofen (ADVIL) 200 MG tablet Take 400 mg by mouth every 8 (eight) hours as needed for fever.  Marland Kitchen MAGNESIUM PO Take 1 tablet by mouth daily.   . Omega-3 Fatty Acids (FISH OIL PO) Take 500 mg by mouth daily.  . rosuvastatin (CRESTOR) 10 MG tablet TAKE 1 TABLET DAILY AT 6 P.M.  . traZODone (DESYREL) 50 MG tablet Take 0.5-1 tablets (25-50 mg total) by mouth at bedtime as needed for sleep.  . vitamin C (ASCORBIC ACID) 500 MG tablet Take 500 mg by mouth every morning.   No facility-administered encounter medications on file as of 01/15/2019.    Physical Exam: Blood pressure 134/73, pulse 70, temperature 98.5 F (36.9 C), temperature source Oral, resp. rate 18, SpO2 94 %. Gen:      No acute distress HEENT:  EOMI, sclera anicteric Neck:     No masses; no thyromegaly Lungs:    Clear to auscultation bilaterally; normal respiratory effort CV:         Regular rate and rhythm; no murmurs Abd:      +  bowel sounds; soft, non-tender; no palpable masses, no distension Ext:    No edema; adequate peripheral perfusion Skin:      Warm and dry; no rash Neuro: alert and oriented x 3 Psych: normal mood and affect  Data Reviewed: Imaging: CT chest 05/21/2015- 4 mm nodule in the minor fissure.  Emphysema. CT chest 05/31/2016- resolution of pulmonary nodule.  Emphysema.   CT chest 11/07/2017- nodular densities in the right upper lobe.  5 mm nodule in the left lower lobe. Emphysema.  Atelectasis in the right lower lobe and lingula. CT chest  02/22/2018- previously described lung nodules have resolved.  New 8 mm left apical lung nodule.  Emphysema CT chest 08/10/2018- improving size of left upper lobe nodule.  Emphysema. I have reviewed the images personally.  PFTs: Spirometry 02/10/2015 FVC 3.0 [97%], FEV1 1.6 [6 6%],F/F 54 Moderate obstruction.  Labs: CBC 02/13/2018-WBC 4.1, eos 0.7%, absolute eosinophil count 29  Sputum culture 6/8-stenotrophomonas, scant yeast Sputum AFB 6/8 x 3-negative Sputum AFB 6/24- MAI  Assessment:  Pulmonary nodule, abnormal CT Ongoing evaluation for nodule on abdominal CT. Appears smaller on recent CT.  Follow-up repeat imaging in 1 year Reasonable to consider MAI infection especially as her pulmonary nodules have waxed and waned with sputum growing MAI, however CT scan findings are not very typical of this.  We had held off bronchoscopy earlier this year after discussion with the patient- but given ongoing symptoms she would like to proceed Risk benefits discussed with patient and she is agreeable.  Plan/Recommendations: - Proceed with bronchoscopy and BAL.  Marshell Garfinkel MD [facility] 01/29/2019, 9:27 AM  CC: No ref. provider found

## 2019-01-29 NOTE — Discharge Instructions (Signed)
Flexible Bronchoscopy, Care After This sheet gives you information about how to care for yourself after your test. Your doctor may also give you more specific instructions. If you have problems or questions, contact your doctor. Follow these instructions at home: Eating and drinking  Do not eat or drink anything (not even water) for 2 hours after your test, or until your numbing medicine (local anesthetic) wears off.  When your numbness is gone and your cough and gag reflexes have come back, you may: ? Eat only soft foods. ? Slowly drink liquids.  The day after the test, go back to your normal diet. Driving  Do not drive for 24 hours if you were given a medicine to help you relax (sedative).  Do not drive or use heavy machinery while taking prescription pain medicine. General instructions   Take over-the-counter and prescription medicines only as told by your doctor.  Return to your normal activities as told. Ask what activities are safe for you.  Do not use any products that have nicotine or tobacco in them. This includes cigarettes and e-cigarettes. If you need help quitting, ask your doctor.  Keep all follow-up visits as told by your doctor. This is important. It is very important if you had a tissue sample (biopsy) taken. Get help right away if:  You have shortness of breath that gets worse.  You get light-headed.  You feel like you are going to pass out (faint).  You have chest pain.  You cough up: ? More than a little blood. ? More blood than before. Summary  Do not eat or drink anything (not even water) for 2 hours after your test, or until your numbing medicine wears off.  Do not use cigarettes. Do not use e-cigarettes.  Get help right away if you have chest pain. This information is not intended to replace advice given to you by your health care provider. Make sure you discuss any questions you have with your health care provider. Document Released: 01/16/2009  Document Revised: 03/03/2017 Document Reviewed: 04/08/2016 Elsevier Patient Education  2020 [employer]. Nothing to eat or drink until   11:15 am today 01/29/2019 Any questions or concerns please call the office at [phone]

## 2019-01-29 NOTE — Op Note (Signed)
[facility] Patient Name: Katrina Campbell Pocedure Date: 01/29/2019 MRN: [id number] Attending MD: Marshell Garfinkel , MD Date of Birth: 04-16-1955 CSN: Finalized Age: 63 Admit Type: Ambulatory Gender: Female Procedure:            Bronchoscopy Indications:          Abnormal CT scan of chest Providers:            Marshell Garfinkel, MD, Andre Lefort RRT,RCP, Mickeal Needy RRT,RCP Referring MD:          Medicines:            Fentanyl 100 mcg IV, Midazolam 4 mg IV Complications:        No immediate complications Estimated Blood Loss: Estimated blood loss: none. Procedure:            Pre-Anesthesia Assessment:                       - A History and Physical has been performed. Patient                        meds and allergies have been reviewed. The risks and                        benefits of the procedure and the sedation options and                        risks were discussed with the patient. All questions                        were answered and informed consent was obtained.                        Patient identification and proposed procedure were                        verified prior to the procedure by the physician in the                        procedure room. Mental Status Examination: alert and                        oriented. Airway Examination: normal oropharyngeal                        airway. Respiratory Examination: clear to auscultation.                        CV Examination: normal. ASA Grade Assessment: II - A                        patient with mild systemic disease. After reviewing the                        risks and benefits, the patient was deemed in                        satisfactory condition to undergo the procedure. The  anesthesia plan was to use moderate sedation /                        analgesia (conscious sedation). Immediately prior to                        administration of  medications, the patient was                        re-assessed for adequacy to receive sedatives. The                        heart rate, respiratory rate, oxygen saturations, blood                        pressure, adequacy of pulmonary ventilation, and                        response to care were monitored throughout the                        procedure. The physical status of the patient was                        re-assessed after the procedure.                       After obtaining informed consent, the bronchoscope was                        passed under direct vision. Throughout the procedure,                        the patient's blood pressure, pulse, and oxygen                        saturations were monitored continuously. the BF-H190                        ([id number]) Olympus Diagnostic Bronchoscope was                        introduced through the right nostril and advanced to                        the tracheobronchial tree of both lungs. Scope In: 8:55:38 AM Scope Out: 9:05:34 AM Findings:      The oropharynx appears normal. The larynx appears normal. The vocal       cords appear normal. The subglottic space is normal. The trachea is of       normal caliber. The carina is sharp. The tracheobronchial tree was       examined to at least the first subsegmental level. Bronchial mucosa and       anatomy are normal; there are no endobronchial lesions. Thiin white       secretions noted bialterally      Bronchoalveolar lavage was performed in the RML medial segment (B5) of       the lung and sent for cell count, bacterial culture, viral smears &       culture, and fungal & AFB analysis and cytology. 180 mL  of fluid were       instilled. 80 mL were returned. The return was blood-tinged. There were       no mucoid plugs in the return fluid. Multiple specimens were obtained       and pooled into one specimen, which was sent for analysis. Impression:           - Abnormal CT scan of  chest                       - The airway examination was normal.                       - Bronchoalveolar lavage was performed. Moderate Sedation:      Moderate (conscious) sedation was administered by the endoscopy nurse       and supervised by the endoscopist. The following parameters were       monitored: oxygen saturation, heart rate, blood pressure, respiratory       rate, EKG, adequacy of pulmonary ventilation, and response to care.       Total physician intraservice time was 25 minutes. Recommendation:       - Await BAL results. Procedure Code(s):    --- Professional ---                       [phone], Bronchoscopy, rigid or flexible, including                        fluoroscopic guidance, when performed; with bronchial                        alveolar lavage                       [id number], Moderate sedation services provided by the same                        physician or other qualified health care professional                        performing the diagnostic or therapeutic service that                        the sedation supports, requiring the presence of an                        independent trained observer to assist in the                        monitoring of the patient's level of consciousness and                        physiological status; initial 15 minutes of                        intraservice time, patient age 30 years or older                       [phone], Moderate sedation; each additional 15 minutes                        intraservice time Diagnosis Code(s):    ---  Professional ---                       R93.89, Abnormal findings on diagnostic imaging of                        other specified body structures CPT copyright 2019 American Medical Association. All rights reserved. The codes documented in this report are preliminary and upon coder review may  be revised to meet current compliance requirements. Marshell Garfinkel, MD 01/29/2019 9:37:47 AM Number of Addenda: 0

## 2019-01-29 NOTE — Progress Notes (Signed)
Video Bronchoscopy done  Intervention Bronchial Washing done  Procedure tolerated well 

## 2019-01-30 ENCOUNTER — Encounter (HOSPITAL_COMMUNITY): Payer: Self-pay | Admitting: Pulmonary Disease

## 2019-01-31 LAB — CULTURE, BAL-QUANTITATIVE W GRAM STAIN
Culture: NORMAL
Special Requests: NORMAL

## 2019-02-04 ENCOUNTER — Encounter: Payer: Self-pay | Admitting: Pulmonary Disease

## 2019-02-04 ENCOUNTER — Telehealth (INDEPENDENT_AMBULATORY_CARE_PROVIDER_SITE_OTHER): Payer: Medicare Other | Admitting: Pulmonary Disease

## 2019-02-04 ENCOUNTER — Telehealth: Payer: Self-pay | Admitting: Pulmonary Disease

## 2019-02-04 DIAGNOSIS — J42 Unspecified chronic bronchitis: Secondary | ICD-10-CM | POA: Diagnosis not present

## 2019-02-04 DIAGNOSIS — A310 Pulmonary mycobacterial infection: Secondary | ICD-10-CM | POA: Diagnosis not present

## 2019-02-04 DIAGNOSIS — R05 Cough: Secondary | ICD-10-CM | POA: Diagnosis not present

## 2019-02-04 DIAGNOSIS — F419 Anxiety disorder, unspecified: Secondary | ICD-10-CM | POA: Diagnosis not present

## 2019-02-04 MED ORDER — HYDROCOD POLST-CPM POLST ER 10-8 MG/5ML PO SUER: 5.0000 mL | Freq: Every evening | ORAL | 0 refills | Status: DC | PRN

## 2019-02-04 NOTE — Telephone Encounter (Signed)
Schedule patient is a video or televisit with APP to further evaluate / discuss.   Wyn Quaker FNP

## 2019-02-04 NOTE — Assessment & Plan Note (Signed)
Plan: I believe patient should follow-up with primary care and consider psychiatry referral If patient is in fact positive for MAI the long course of treatment as well as complicated course of treatment with medications may increase patient's anxiety I would like for her to have the appropriate management in place

## 2019-02-04 NOTE — Assessment & Plan Note (Signed)
Plan: Sputum cultures for BAL from bronchoscopy on 01/29/2019 still pending Preliminary results show negative AFB negative respiratory sputum

## 2019-02-04 NOTE — Telephone Encounter (Signed)
Mychart video appt made. Nothing further needed at this time.

## 2019-02-04 NOTE — Assessment & Plan Note (Signed)
Status post bronchoscopy on 01/29/2019 No recent recorded fevers Sputum is clear Patient does not feel that her sputum amount has increased Dry and productive cough  Discussion: Discussed case with Dr. Vaughan Browner today.  With patient's recent bronchoscopy as well as currently pending sputum samples we will try to hold off on initiating additional antibiotic treatment.  Patient was treated with Bactrim for stenotrophomonas in June/2020.  Will await sputum culture results and AFB results before initiating additional treatment.  As patient does not have recorded temperatures and is not having discolored sputum, will hold off on empiric treatment of Levaquin.  Plan: We will prescribe Tussionex narcotic cough medicine to use at night Patient can use over-the-counter cough medicine Delsym during the day Patient declined Tessalon prescription today  If patient symptoms worsen or she starts to develop fevers or discolored sputum she needs to contact our office.  Patient to keep 2-week follow-up with our office to see Dr. Vaughan Browner

## 2019-02-04 NOTE — Telephone Encounter (Signed)
Call returned to patient, she feels like her cough is getting worse and worse. She reports when she is able to cough up anything it is clear. She reports her cough is keeping her up at night. She reports having increased body soreness from the constant coughing. She states she thought she would have the results of her Bronch by now to see what was wrong with her. She confirms that she is taking the protonix, using her  Trelegy daily, and taking the chlor tabs. She reports it seems like the coughing has gotten worse after the Bronch.   BM please advise. Thanks.

## 2019-02-04 NOTE — Patient Instructions (Signed)
You were seen today by Lauraine Rinne, NP  for:   1. 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 Chronic bronchitis, unspecified chronic bronchitis type ([place])  4. Anxiety  I would recommend that you follow-up with primary care regarding anxiety and depression management.  It would be beneficial to discuss with primary care if you would benefit from having a psychiatry referral    Follow Up:    Return in about 2 weeks (around 02/18/2019), or if symptoms worsen or fail to improve, for Follow up with Dr. Vaughan Browner.   Please do your part to reduce the spread of COVID-19:      Reduce your risk of any infection  and COVID19 by using the similar precautions used for avoiding the common cold or flu:  Marland Kitchen Wash your hands often with soap and warm water for at least 20 seconds.  If soap and water are not readily available, use an alcohol-based hand sanitizer with at least 60% alcohol.  . If coughing or sneezing, cover your mouth and nose by coughing or sneezing into the elbow areas of your shirt or coat, into a tissue or into your sleeve (not your hands). Langley Gauss A MASK when in public  . Avoid shaking hands with others and consider head nods or verbal greetings only. . Avoid touching your eyes, nose, or mouth with unwashed hands.  . Avoid close contact with people who are sick. . Avoid places or events with large numbers of people in one location, like concerts or sporting events. . If you have some symptoms but not all symptoms, continue to monitor at home and seek medical attention if your symptoms worsen. . If you are having a medical emergency, call 911.   [place] / e-Visit: [email or web address]         [facility]: [facility]: [phone]                   [facility]: [phone]     It is flu season:   >>> Best ways to protect herself from the flu: Receive the yearly flu vaccine, practice good hand hygiene washing with soap and also using hand sanitizer when available, eat a nutritious meals, get adequate rest, hydrate appropriately   Please contact the office if your symptoms worsen or you have concerns that you are not improving.   Thank you for choosing [facility] for your healthcare, and for allowing [place] to partner with you on your healthcare journey. I am thankful to be able to provide care to you today.   Wyn Quaker FNP-C

## 2019-02-04 NOTE — Progress Notes (Signed)
Virtual Visit via Video Note  I connected with Katrina Campbell on 02/04/19 at  3:00 PM EST by a video enabled telemedicine application and verified that I am speaking with the correct person using two identifiers.  Location: Patient: Home Provider: Office - [facility] - [address], [address], [place], [place] [address]  I discussed the limitations of evaluation and management by telemedicine and the availability of in person appointments. The patient expressed understanding and agreed to proceed. I also discussed with the patient that there may be a patient responsible charge related to this service. The patient expressed understanding and agreed to proceed.  Patient consented to consult via telephone: Yes People present and their role in pt care: Pt   History of Present Illness:  63 year old female former smoker followed in our office for chronic cough, COPD and history of lung nodules.  Patient has had a recent work-up for abnormal sputum results in June/2020 with stenotrophomonas.  She was treated with Bactrim.  Patient has also had an abnormal sputum showing MAI.  She is only had one positive sputum for MAI.  Patient recently underwent a bronchoscopy by Dr. Vaughan Browner to further evaluate potential diagnosis of MAI on 01/29/2019.  Sputum cultures are currently pending.  Past medical history: Depression, anxiety, hyperlipidemia, CAD, chronic insomnia Smoking history: Former smoker.  Quit 2014.  40-pack-year smoking. Maintenance: None Patient of Dr. Vaughan Browner  Chief complaint: Status post bronchoscopy, requesting results, symptoms not improving   63 year old female former smoker completing a video visit with our office today after having a bronchoscopy on 01/29/2019.  Patient has a chronic cough and suspected MAI infection.  Patient's most recent bronchoscopy was to further evaluate and to seek out inadequate sputum sample for testing for MAI.  Sputum samples are currently pending.  Patient  reports that her cough continues to worsen she is very frustrated by this.  She reports she is not sleeping well.  She reports that this is increased her anxiety as well as her depression due to her overwhelming fatigue as well as inability to sleep.  She has been evaluated by infectious disease Dr. Drucilla Schmidt but patient only had 1+ MAI culture and did not feel to have CT that immediately looked like MAI.  This is what prompted establishing with Dr. Vaughan Browner as well as the bronchoscopy on 01/29/2019.  Patient also had a abnormal sputum culture in June/2020 showing stenotrophomonas.  This was susceptible to Bactrim as well as Levaquin.  Patient was treated with Levaquin.  Patient's cough is occasionally dry or productive.  If it is productive it is clear sputum.  Patient is unsure if she is having fevers.  She does endorse chills.  Her thermometer does not have a battery in it.  She is not using a flutter valve.  She does not feel that anything has adequately manage her cough.  She is tried [employer], over-the-counter cough medicine, narcotic cough syrup without much success.  Observations/Objective:  Constant Cough throughout conversation today    01/26/2019 - sarscov2 - negative   01/29/2019-bronchoscopy BAL-rare white blood cells present, consistent with normal respiratory flora Fungal-still pending, no fungus observed AFB-negative  09/10/2018-respiratory sputum culture-stenotrophomonas maltophilia, sensitive to Levaquin and Bactrim >>> EW treated with Bactrim  Data Reviewed: Imaging: CT chest 05/21/2015- 4 mm nodule in the minor fissure.  Emphysema. CT chest 05/31/2016- resolution of pulmonary nodule.  Emphysema.   CT chest 11/07/2017- nodular densities in the right upper lobe.  5 mm nodule in the left lower  lobe. Emphysema.  Atelectasis in the right lower lobe and lingula. CT chest 02/22/2018- previously described lung nodules have resolved.  New 8 mm left apical lung nodule.  Emphysema CT  chest 08/10/2018- improving size of left upper lobe nodule.  Emphysema.  PFTs: Spirometry 02/10/2015 FVC 3.0 [97%], FEV1 1.6 [6 6%],F/F 54 Moderate obstruction.  Labs: CBC 02/13/2018-WBC 4.1, eos 0.7%, absolute eosinophil count 29  Sputum culture 6/8-stenotrophomonas, scant yeast Sputum AFB 6/8 x 3-negative Sputum AFB 6/24- MAI  Assessment and Plan:  COPD (chronic obstructive pulmonary disease) ([place]) Plan: Keep scheduled follow-up in 2 weeks with our office with Dr. Vaughan Browner  Pulmonary mycobacterial infection [facility]) Plan: Sputum cultures for BAL from bronchoscopy on 01/29/2019 still pending Preliminary results show negative AFB negative respiratory sputum  Anxiety Plan: I believe patient should follow-up with primary care and consider psychiatry referral If patient is in fact positive for MAI the long course of treatment as well as complicated course of treatment with medications may increase patient's anxiety I would like for her to have the appropriate management in place  Cough Status post bronchoscopy on 01/29/2019 No recent recorded fevers Sputum is clear Patient does not feel that her sputum amount has increased Dry and productive cough  Discussion: Discussed case with Dr. Vaughan Browner today.  With patient's recent bronchoscopy as well as currently pending sputum samples we will try to hold off on initiating additional antibiotic treatment.  Patient was treated with Bactrim for stenotrophomonas in June/2020.  Will await sputum culture results and AFB results before initiating additional treatment.  As patient does not have recorded temperatures and is not having discolored sputum, will hold off on empiric treatment of Levaquin.  Plan: We will prescribe Tussionex narcotic cough medicine to use at night Patient can use over-the-counter cough medicine Delsym during the day Patient declined Tessalon prescription today  If patient symptoms worsen or she starts to develop fevers or  discolored sputum she needs to contact our office.  Patient to keep 2-week follow-up with our office to see Dr. Vaughan Browner   Before prescribing the patient with Tussionex, I have checked [place] PMP aware and the patients overdose risk score is 190. Patient has 1 providers prescribing controlled substances. Patient has used 1 pharmacies. I have counseled the patient on the sedative effects of Tussionex. Patient to use this medication sparingly and not when driving, drinking alcohol, or using additional sedative medications. Patient has been prescribed 116m with no refills.    Follow Up Instructions:  Return in about 2 weeks (around 02/18/2019), or if symptoms worsen or fail to improve, for Follow up with Dr. MVaughan Browner    I discussed the assessment and treatment plan with the patient. The patient was provided an opportunity to ask questions and all were answered. The patient agreed with the plan and demonstrated an understanding of the instructions.   The patient was advised to call back or seek an in-person evaluation if the symptoms worsen or if the condition fails to improve as anticipated.  I provided 26 minutes of non-face-to-face time during this encounter.   BLauraine Rinne NP

## 2019-02-04 NOTE — Assessment & Plan Note (Addendum)
Plan: Keep scheduled follow-up in 2 weeks with our office with Dr. Vaughan Browner

## 2019-02-13 ENCOUNTER — Telehealth: Payer: Self-pay

## 2019-02-13 DIAGNOSIS — A310 Pulmonary mycobacterial infection: Secondary | ICD-10-CM

## 2019-02-13 NOTE — Telephone Encounter (Signed)
Referral placed for Infectious Disease/Dr. Tommy Medal per Dr. Vaughan Browner. I have also canceled her follow up appointment with him per his request.

## 2019-02-13 NOTE — Telephone Encounter (Signed)
-----   Message from Marshell Garfinkel, MD sent at 02/13/2019  8:32 AM EST ----- Labs reviewed with AFB positive. This is likely MAI. I called and discussed results with patient in detail Other tests show mold and bacteria in BAL although final bacterial cultures are negative and the fungal cultures are pending. The latter may be contaminants.  We'll make a referral to see Dr. Tommy Medal again to discuss treatment

## 2019-02-19 ENCOUNTER — Ambulatory Visit: Payer: Medicare Other | Admitting: Pulmonary Disease

## 2019-02-25 ENCOUNTER — Other Ambulatory Visit: Payer: Self-pay

## 2019-02-25 ENCOUNTER — Ambulatory Visit (INDEPENDENT_AMBULATORY_CARE_PROVIDER_SITE_OTHER): Payer: Medicare Other | Admitting: Infectious Disease

## 2019-02-25 ENCOUNTER — Encounter: Payer: Self-pay | Admitting: Infectious Disease

## 2019-02-25 VITALS — BP 164/79 | HR 78 | Temp 98.6°F | Wt 142.0 lb

## 2019-02-25 DIAGNOSIS — I2510 Atherosclerotic heart disease of native coronary artery without angina pectoris: Secondary | ICD-10-CM

## 2019-02-25 DIAGNOSIS — A310 Pulmonary mycobacterial infection: Secondary | ICD-10-CM

## 2019-02-25 DIAGNOSIS — J42 Unspecified chronic bronchitis: Secondary | ICD-10-CM

## 2019-02-25 DIAGNOSIS — I700 Atherosclerosis of aorta: Secondary | ICD-10-CM

## 2019-02-25 DIAGNOSIS — R9389 Abnormal findings on diagnostic imaging of other specified body structures: Secondary | ICD-10-CM | POA: Diagnosis not present

## 2019-02-25 MED ORDER — AZITHROMYCIN 500 MG PO TABS: 500.0000 mg | ORAL_TABLET | ORAL | 11 refills | Status: DC

## 2019-02-25 MED ORDER — RIFAMPIN 300 MG PO CAPS: 600.0000 mg | ORAL_CAPSULE | ORAL | 11 refills | Status: DC

## 2019-02-25 MED ORDER — ETHAMBUTOL HCL 400 MG PO TABS: 1600.0000 mg | ORAL_TABLET | ORAL | 11 refills | Status: DC

## 2019-02-25 NOTE — Progress Notes (Signed)
Subjective:   Chief complaint: persistent coughing    Patient ID: Katrina Campbell, female    DOB: 30-Nov-1955, 63 y.o.   MRN: [id number]  HPI  63 year old female, former smoker quit 2014. PMH significant for COPD GOLD 2, paraseptal emphysema, dyspnea, pulmonary nodules, abnormal CT chest, CAD, thoracic aortic aneurysm, ACS, atherosclerosis of the aorta.  Patient is still being followed in pulmonary clinic and most recently seen by Derl Barrow in a virtual visit on Aug 08, 2018.  She relates a history of worsening cough this spring that has been going on since February.  This began shortly after she was discharged in the hospital for chest pain with a work-up and thought that she was suffering from gastroesophageal reflux disease.  She is not having fevers or systemic symptoms of nausea or vomiting but has had some hemoptysis in the past which improved with cessation of nonsteroidals, continues to cough throughout the day at worse at night in terms of how much it bothers her.  She makes quite a bit of sputum.  This is been soft for AFB and for fungal and for bacterial culture and the Stenotrophomonas maltophilia organism was isolated.  She denies having been on recent antibiotics was given levofloxacin prescription but did not want to take it because she read that it was "an antibiotic of last resort".  I noted her history of nodules that if she shown up and then disappeared and gotten smaller in time.  I wondered if she could potentially be harboring Mycobacterium avium though she did not grow on her most recent AFB culture yet.  She also did not have other symptoms that would fit perfectly with that diagnosis such as weight loss or fevers.  But she certainly does have a chronic cough that is not resolving.  Since I last saw her she underwent bronchoscopy w BAL and is now growing M avium from cultures from BAL.   Past Medical History:  Diagnosis Date  . Anxiety   . COPD (chronic obstructive  pulmonary disease) ([place])   . Depression   . Hyperlipidemia   . Labile hypertension   . Pulmonary mycobacterial infection ([place]) 10/24/2018    Past Surgical History:  Procedure Laterality Date  . LEFT HEART CATH AND CORONARY ANGIOGRAPHY N/A 11/07/2017   Procedure: LEFT HEART CATH AND CORONARY ANGIOGRAPHY;  Surgeon: Nelva Bush, MD;  Location: [facility];  Service: Cardiovascular;  Laterality: N/A;  . NASAL SINUS SURGERY  1986  . VIDEO BRONCHOSCOPY Bilateral 01/29/2019   Procedure: VIDEO BRONCHOSCOPY WITH FLUORO;  Surgeon: Marshell Garfinkel, MD;  Location: [facility];  Service: Cardiopulmonary;  Laterality: Bilateral;    Family History  Problem Relation Age of Onset  . Heart attack Mother   . Emphysema Mother   . Congestive Heart Failure Mother   . Heart attack Father   . Congestive Heart Failure Father   . Asthma Other   . Lung disease Sister   . Lung disease Brother   . Colon cancer Neg Hx       Social History   Socioeconomic History  . Marital status: Married    Spouse name: Not on file  . Number of children: Not on file  . Years of education: Not on file  . Highest education level: Not on file  Occupational History  . Not on file  Social Needs  . Financial resource strain: Not on file  . Food insecurity    Worry: Not on file  Inability: Not on file  . Transportation needs    Medical: Not on file    Non-medical: Not on file  Tobacco Use  . Smoking status: Former Smoker    Packs/day: 1.00    Years: 40.00    Pack years: 40.00    Types: Cigarettes    Quit date: 11/09/2012    Years since quitting: 6.2  . Smokeless tobacco: Never Used  . Tobacco comment: vapor  Substance and Sexual Activity  . Alcohol use: Yes    Alcohol/week: 24.0 standard drinks    Types: 24 Cans of beer per week  . Drug use: No  . Sexual activity: Not Currently  Lifestyle  . Physical activity    Days per week: Not on file    Minutes per session: Not on file  . Stress: Not on  file  Relationships  . Social Herbalist on phone: Not on file    Gets together: Not on file    Attends religious service: Not on file    Active member of club or organization: Not on file    Attends meetings of clubs or organizations: Not on file    Relationship status: Not on file  Other Topics Concern  . Not on file  Social History Narrative  . Not on file    No Known Allergies   Current Outpatient Medications:  .  albuterol (PROAIR HFA) 108 (90 BASE) MCG/ACT inhaler, Inhale 2 puffs into the lungs every 6 (six) hours as needed for wheezing or shortness of breath., Disp: 18 g, Rfl: 3 .  aspirin EC 81 MG tablet, Take 81 mg by mouth daily with lunch. , Disp: , Rfl:  .  buPROPion (WELLBUTRIN XL) 300 MG 24 hr tablet, Take 300 mg by mouth daily with lunch. , Disp: , Rfl:  .  Calcium Carb-Cholecalciferol (CALCIUM-VITAMIN D) 500-400 MG-UNIT TABS, 1 a day (Patient taking differently: Take 1 tablet by mouth daily with lunch. ), Disp: 60 tablet, Rfl:  .  chlorpheniramine (CHLOR-TRIMETON) 4 MG tablet, Take 2 tablets (8 mg total) by mouth 3 (three) times daily as needed for allergies., Disp: 60 tablet, Rfl: 1 .  chlorpheniramine-HYDROcodone (TUSSIONEX PENNKINETIC ER) 10-8 MG/5ML SUER, Take 5 mLs by mouth at bedtime as needed for cough., Disp: 140 mL, Rfl: 0 .  fluticasone (FLONASE) 50 MCG/ACT nasal spray, Place 2 sprays into both nostrils as needed for allergies or rhinitis., Disp: , Rfl:  .  furosemide (LASIX) 20 MG tablet, Take 1 tablet (20 mg total) by mouth daily as needed for edema (Swelling)., Disp: 30 tablet, Rfl: 6 .  guaiFENesin (MUCINEX) 600 MG 12 hr tablet, Take 600 mg by mouth 2 (two) times daily. , Disp: , Rfl:  .  ibuprofen (ADVIL) 200 MG tablet, Take 400 mg by mouth every 8 (eight) hours as needed (pain/fever). , Disp: , Rfl:  .  Magnesium 500 MG TABS, Take 500 mg by mouth daily with lunch., Disp: , Rfl:  .  Omega-3 Fatty Acids (FISH OIL) 1000 MG CPDR, Take 1,000 mg by  mouth daily with lunch., Disp: , Rfl:  .  pantoprazole (PROTONIX) 40 MG tablet, Take 1 tablet (40 mg total) by mouth daily., Disp: 90 tablet, Rfl: 1 .  Respiratory Therapy Supplies (FLUTTER) DEVI, Use 3 times daily as directed to loosen sputum in chest., Disp: 1 each, Rfl: 0 .  rosuvastatin (CRESTOR) 10 MG tablet, TAKE 1 TABLET DAILY AT 6 P.M., Disp: 90 tablet, Rfl: 3 .  vitamin C (ASCORBIC ACID) 500 MG tablet, Take 500 mg by mouth daily. , Disp: , Rfl:  .  traZODone (DESYREL) 50 MG tablet, Take 0.5-1 tablets (25-50 mg total) by mouth at bedtime as needed for sleep. (Patient not taking: Reported on 02/25/2019), Disp: 30 tablet, Rfl: 3   Review of Systems  Constitutional: Negative for chills and fever.  HENT: Negative for congestion and sore throat.   Eyes: Negative for photophobia.  Respiratory: Positive for chest tightness and shortness of breath. Negative for wheezing.   Cardiovascular: Negative for chest pain, palpitations and leg swelling.  Gastrointestinal: Negative for abdominal pain, blood in stool, constipation, diarrhea, nausea and vomiting.  Genitourinary: Negative for dysuria, flank pain and hematuria.  Musculoskeletal: Negative for back pain and myalgias.  Skin: Negative for rash.  Neurological: Negative for dizziness, weakness and headaches.  Hematological: Does not bruise/bleed easily.  Psychiatric/Behavioral: Negative for suicidal ideas.       Objective:   Physical Exam Constitutional:      General: She is not in acute distress.    Appearance: She is not diaphoretic.  HENT:     Head: Normocephalic and atraumatic.     Right Ear: External ear normal.     Left Ear: External ear normal.     Nose: Nose normal.     Mouth/Throat:     Pharynx: No oropharyngeal exudate.  Eyes:     General: No scleral icterus.    Conjunctiva/sclera: Conjunctivae normal.     Pupils: Pupils are equal, round, and reactive to light.  Neck:     Musculoskeletal: Normal range of motion and neck  supple.  Cardiovascular:     Rate and Rhythm: Normal rate and regular rhythm.     Heart sounds: Normal heart sounds. No murmur. No friction rub. No gallop.   Pulmonary:     Effort: Prolonged expiration present. No respiratory distress.     Breath sounds: Normal breath sounds. No stridor. No wheezing or rales.  Abdominal:     General: Bowel sounds are normal.     Palpations: Abdomen is soft.     Tenderness: There is abdominal tenderness. There is rebound.  Musculoskeletal: Normal range of motion.        General: No tenderness.  Lymphadenopathy:     Cervical: No cervical adenopathy.  Skin:    General: Skin is warm and dry.     Coloration: Skin is not pale.     Findings: No erythema or rash.  Neurological:     General: No focal deficit present.     Mental Status: She is alert and oriented to person, place, and time.     Coordination: Coordination normal.  Psychiatric:        Mood and Affect: Mood normal.        Thought Content: Thought content normal.        Judgment: Judgment normal.           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

## 2019-02-26 ENCOUNTER — Other Ambulatory Visit: Payer: Self-pay | Admitting: Pulmonary Disease

## 2019-02-26 DIAGNOSIS — R059 Cough, unspecified: Secondary | ICD-10-CM

## 2019-02-26 DIAGNOSIS — R05 Cough: Secondary | ICD-10-CM

## 2019-02-26 MED ORDER — HYDROCOD POLST-CPM POLST ER 10-8 MG/5ML PO SUER: 5.0000 mL | Freq: Every evening | ORAL | 0 refills | Status: DC | PRN

## 2019-02-26 NOTE — Telephone Encounter (Signed)
02/26/2019 2055  I have electronically sent the prescription for Tussionex over to the patient's pharmacy. Before prescribing the patient with Tussionex, I have checked [place] PMP aware and the patients overdose risk score is 190. Patient has 2 providers prescribing controlled substances. Patient has used 1 pharmacies.   Triage please counsel the patient on the sedative effects of Tussionex. Patient to use this medication sparingly and not when driving, drinking alcohol, or using additional sedative medications.   Patient has been prescribed 133ml with no refills.   Patient needs to keep upcoming follow-up with Dr. Vaughan Browner.  Patient also needs to complete infectious disease appointment.  Wyn Quaker, FNP

## 2019-02-26 NOTE — Telephone Encounter (Signed)
Pt's hydrocodone cough med was last filled 02/04/2019 #149ml.  Called and spoke with pt. Pt is requesting to have her hydrocodone cough syrup refilled as she says she needs it at night to help her sleep. Aaron Edelman, please advise. Thanks!

## 2019-02-27 ENCOUNTER — Telehealth: Payer: Self-pay | Admitting: Pulmonary Disease

## 2019-02-27 NOTE — Telephone Encounter (Signed)
Called pt and advised message from the provider. Pt understood and verbalized understanding. Nothing further is needed.   Appt made for 03/13/2019 at 2:15 with Dr. Vaughan Browner

## 2019-02-27 NOTE — Telephone Encounter (Signed)
LMTCB

## 2019-03-04 NOTE — Telephone Encounter (Signed)
LMTCB

## 2019-03-05 NOTE — Telephone Encounter (Signed)
LMTCB

## 2019-03-07 NOTE — Telephone Encounter (Addendum)
Called and spoke to pt. Informed her of the recs per Aaron Edelman. She has had her ID appt already and states she will keep her upcoming follow up here. Nothing further needed at this time. Will sign off.

## 2019-03-12 ENCOUNTER — Telehealth: Payer: Self-pay | Admitting: Adult Health

## 2019-03-12 ENCOUNTER — Other Ambulatory Visit: Payer: Self-pay

## 2019-03-12 ENCOUNTER — Telehealth: Payer: Self-pay | Admitting: *Deleted

## 2019-03-12 DIAGNOSIS — A310 Pulmonary mycobacterial infection: Secondary | ICD-10-CM

## 2019-03-12 MED ORDER — AZITHROMYCIN 500 MG PO TABS: 500.0000 mg | ORAL_TABLET | ORAL | 2 refills | Status: DC

## 2019-03-12 MED ORDER — RIFAMPIN 300 MG PO CAPS: 600.0000 mg | ORAL_CAPSULE | ORAL | 2 refills | Status: DC

## 2019-03-12 MED ORDER — ETHAMBUTOL HCL 400 MG PO TABS: 1600.0000 mg | ORAL_TABLET | ORAL | 2 refills | Status: DC

## 2019-03-12 NOTE — Telephone Encounter (Signed)
Lets do a televisit

## 2019-03-12 NOTE — Telephone Encounter (Signed)
Called patient to do covid screen for appt 12/9 Pt reports fever last night for the 1st time since March. Pt has nausea and fatigue. Pt denies any other symptoms. She thinks it is side effects of starting a new medication. Please advise as to if she needs to change visit type?

## 2019-03-12 NOTE — Telephone Encounter (Signed)
Spoke with pt, appt switched to a televisit.  Nothing further needed at this time- will close encounter.

## 2019-03-12 NOTE — Telephone Encounter (Signed)
Would like to change MD from Dr. Vaughan Browner to Dr. Valeta Harms if possible .

## 2019-03-13 ENCOUNTER — Ambulatory Visit: Payer: Medicare Other | Admitting: Pulmonary Disease

## 2019-03-13 NOTE — Telephone Encounter (Signed)
Called and spoke with pt letting her know that we were going to switch her from Dr. Vaughan Browner to Dr.Icard to take over care and she verbalized understanding. Dr. Valeta Harms does not have any openings for 69min visit for December so recall has been placed for appt in January with Dr. Valeta Harms. Nothing further needed.

## 2019-03-13 NOTE — Telephone Encounter (Signed)
Sure. Ok to switch.

## 2019-03-13 NOTE — Telephone Encounter (Signed)
Dr Vaughan Browner please advise if okay for patient to switch to Dr Valeta Harms.  Thank you.

## 2019-03-14 ENCOUNTER — Telehealth: Payer: Self-pay

## 2019-03-14 NOTE — Telephone Encounter (Signed)
Please call her and ask if she would like to be seen sooner, I am glad to see her sooner to discuss any issues if she would like, if not then keep ov with DR. Icard as planned in Jan.

## 2019-03-14 NOTE — Telephone Encounter (Signed)
After completing phone in orders for patients medications, received multiple faxes requesting similar information that was provided via phone in. Received additional fax this morning 03/14/19 requesting ICD code for patient's diagnosis per [place] requirements. Provided technician with ICD A31.0 for Pulmonary Mycobacterial Infection and confirmed with technician that no additional information was necessary in order for patient to receive her medications. No other information was needed and medication refills were confirmed.   Vincenzina Jagoda Lorita Officer, RN

## 2019-03-14 NOTE — Telephone Encounter (Signed)
Spoke with the pt  She does want appt and prefers in office  covid screen neg  Appt scheduled  Nothing further needed

## 2019-03-15 ENCOUNTER — Ambulatory Visit (INDEPENDENT_AMBULATORY_CARE_PROVIDER_SITE_OTHER): Payer: Medicare Other | Admitting: Adult Health

## 2019-03-15 ENCOUNTER — Encounter: Payer: Self-pay | Admitting: Adult Health

## 2019-03-15 ENCOUNTER — Other Ambulatory Visit: Payer: Self-pay

## 2019-03-15 DIAGNOSIS — I2510 Atherosclerotic heart disease of native coronary artery without angina pectoris: Secondary | ICD-10-CM | POA: Diagnosis not present

## 2019-03-15 DIAGNOSIS — A310 Pulmonary mycobacterial infection: Secondary | ICD-10-CM | POA: Diagnosis not present

## 2019-03-15 DIAGNOSIS — J42 Unspecified chronic bronchitis: Secondary | ICD-10-CM | POA: Diagnosis not present

## 2019-03-15 DIAGNOSIS — Z23 Encounter for immunization: Secondary | ICD-10-CM | POA: Diagnosis not present

## 2019-03-15 NOTE — Assessment & Plan Note (Signed)
Currently compensated on present regimen. Increase flutter valve to 2 3 times a day.  Plan  Patient Instructions  Flu shot today.  Continue on TRELEGY 1 puff daily , rinse after use.  Flutter valve 2-3 times a day.  Continue Ethambutol, Rifampin and Azithromycin 3 days a week.  Follow up with ID clinic as planned.  Mucinex daily As needed  Congestion .  Fluids and rest  Advance activity as tolerated.  Follow up with Dr. Valeta Harms as planned next month and As needed

## 2019-03-15 NOTE — Patient Instructions (Addendum)
Flu shot today.  Continue on TRELEGY 1 puff daily , rinse after use.  Flutter valve 2-3 times a day.  Continue Ethambutol, Rifampin and Azithromycin 3 days a week.  Follow up with ID clinic as planned.  Mucinex daily As needed  Congestion .  Fluids and rest  Advance activity as tolerated.  Follow up with Dr. Valeta Harms as planned next month and As needed

## 2019-03-15 NOTE — Progress Notes (Signed)
_0  ID: Katrina Campbell, female    DOB: 09/01/1955, 63 y.o.   MRN: [id number]  Chief Complaint  Patient presents with  . Follow-up    MAI     Referring provider: Alycia Rossetti, MD  HPI: 63 year old female former smoker (quit 2014) followed for moderate COPD (Gold II ), emphysema, Lung nodules, and  MAI  TEST/EVENTS :  01/26/2019 - sarscov2 - negative   01/29/2019-bronchoscopy BAL-rare white blood cells present, consistent with normal respiratory flora Fungal-still pending, no fungus observed AFB-negative  09/10/2018-respiratory sputum culture-stenotrophomonas maltophilia, sensitive to Levaquin and Bactrim >>> EW treated with Bactrim  Data Reviewed: Imaging: CT chest 05/21/2015- 4 mm nodule in the minor fissure.  Emphysema. CT chest 05/31/2016- resolution of pulmonary nodule.  Emphysema.   CT chest 11/07/2017- nodular densities in the right upper lobe.  5 mm nodule in the left lower lobe. Emphysema.  Atelectasis in the right lower lobe and lingula. CT chest 02/22/2018- previously described lung nodules have resolved.  New 8 mm left apical lung nodule.  Emphysema CT chest 08/10/2018- improving size of left upper lobe nodule.  Emphysema.  PFTs: Spirometry 02/10/2015 FVC 3.0 [97%], FEV1 1.6 [6 6%],F/F 54 Moderate obstruction.  Labs: CBC 02/13/2018-WBC 4.1, eos 0.7%, absolute eosinophil count 29  Sputum culture 6/8-stenotrophomonas, scant yeast Sputum AFB 6/8 x 3-negative Sputum AFB 6/24- MAI BAL 01/29/2019 -MAI +   03/15/2019 Follow up : COPD and MAI  Patient presents for a 1 month follow-up.  Patient has underlying moderate COPD.  Has been undergoing work-up this past year for chronic cough and abnormal CT chest with pulmonary nodules.  Sputum culture June 2020 showed MAI.  Patient underwent a bronchoscopy with BAL January 29, 2019 that was also positive for MAI.  Patient has been referred to infectious disease.  Has seen Dr. Tommy Medal I recommended to begin treatment for  underlying MAI.  She was started on triple therapy with azithromycin, rifampin and ethambutol February 25, 2019.  Patient says she is tolerating okay she takes the medications Monday Wednesday and Friday.  Does have some associated nausea but says it is tolerable.  She denies any weight loss.  No hemoptysis.  Does feel like that her daily cough has slightly improved.  She coughs daily with mucus production.  She remains on Trelegy daily along with Mucinex and flutter valve. She says she is very active.  Exercises daily on her treadmill.  No Known Allergies  Immunization History  Administered Date(s) Administered  . Fluad Quad(high Dose 65+) 03/15/2019  . Influenza Inj Mdck Quad Pf 03/21/2018    Past Medical History:  Diagnosis Date  . Anxiety   . COPD (chronic obstructive pulmonary disease) ([place])   . Depression   . Hyperlipidemia   . Labile hypertension   . Pulmonary mycobacterial infection ([place]) 10/24/2018    Tobacco History: Social History   Tobacco Use  Smoking Status Former Smoker  . Packs/day: 1.00  . Years: 40.00  . Pack years: 40.00  . Types: Cigarettes  . Quit date: 11/09/2012  . Years since quitting: 6.3  Smokeless Tobacco Never Used  Tobacco Comment   vapor   Counseling given: Not Answered Comment: vapor   Outpatient Medications Prior to Visit  Medication Sig Dispense Refill  . albuterol (PROAIR HFA) 108 (90 BASE) MCG/ACT inhaler Inhale 2 puffs into the lungs every 6 (six) hours as needed for wheezing or shortness of breath. 18 g 3  . aspirin EC 81 MG tablet Take 81 mg by  mouth daily with lunch.     Marland Kitchen azithromycin (ZITHROMAX) 500 MG tablet Take 1 tablet (500 mg total) by mouth 3 (three) times a week. 45 tablet 2  . buPROPion (WELLBUTRIN XL) 300 MG 24 hr tablet Take 300 mg by mouth daily with lunch.     . Calcium Carb-Cholecalciferol (CALCIUM-VITAMIN D) 500-400 MG-UNIT TABS 1 a day (Patient taking differently: Take 1 tablet by mouth daily with lunch. ) 60 tablet     . ethambutol (MYAMBUTOL) 400 MG tablet Take 4 tablets (1,600 mg total) by mouth 3 (three) times a week. 144 tablet 2  . fluticasone (FLONASE) 50 MCG/ACT nasal spray Place 2 sprays into both nostrils as needed for allergies or rhinitis.    . Fluticasone-Umeclidin-Vilant (TRELEGY ELLIPTA) 100-62.5-25 MCG/INH AEPB     . furosemide (LASIX) 20 MG tablet Take 1 tablet (20 mg total) by mouth daily as needed for edema (Swelling). 30 tablet 6  . guaiFENesin (MUCINEX) 600 MG 12 hr tablet Take 600 mg by mouth 2 (two) times daily.     . Magnesium 500 MG TABS Take 500 mg by mouth daily with lunch.    . pantoprazole (PROTONIX) 40 MG tablet Take 1 tablet (40 mg total) by mouth daily. 90 tablet 1  . Respiratory Therapy Supplies (FLUTTER) DEVI Use 3 times daily as directed to loosen sputum in chest. 1 each 0  . rifampin (RIFADIN) 300 MG capsule Take 2 capsules (600 mg total) by mouth 3 (three) times a week. 72 capsule 2  . rosuvastatin (CRESTOR) 10 MG tablet TAKE 1 TABLET DAILY AT 6 P.M. 90 tablet 3  . traZODone (DESYREL) 50 MG tablet Take 0.5-1 tablets (25-50 mg total) by mouth at bedtime as needed for sleep. 30 tablet 3  . vitamin C (ASCORBIC ACID) 500 MG tablet Take 500 mg by mouth daily.     . chlorpheniramine (CHLOR-TRIMETON) 4 MG tablet Take 2 tablets (8 mg total) by mouth 3 (three) times daily as needed for allergies. (Patient not taking: Reported on 03/15/2019) 60 tablet 1  . chlorpheniramine-HYDROcodone (TUSSIONEX PENNKINETIC ER) 10-8 MG/5ML SUER Take 5 mLs by mouth at bedtime as needed for cough. (Patient not taking: Reported on 03/15/2019) 140 mL 0  . ibuprofen (ADVIL) 200 MG tablet Take 400 mg by mouth every 8 (eight) hours as needed (pain/fever).     . Omega-3 Fatty Acids (FISH OIL) 1000 MG CPDR Take 1,000 mg by mouth daily with lunch.     No facility-administered medications prior to visit.     Review of Systems:   Constitutional:   No  weight loss, night sweats,  Fevers, chills,  +fatigue, or   lassitude.  HEENT:   No headaches,  Difficulty swallowing,  Tooth/dental problems, or  Sore throat,                No sneezing, itching, ear ache, nasal congestion, post nasal drip,   CV:  No chest pain,  Orthopnea, PND, swelling in lower extremities, anasarca, dizziness, palpitations, syncope.   GI  No heartburn, indigestion, abdominal pain, nausea, vomiting, diarrhea, change in bowel habits, loss of appetite, bloody stools.   Resp:    No chest wall deformity  Skin: no rash or lesions.  GU: no dysuria, change in color of urine, no urgency or frequency.  No flank pain, no hematuria   MS:  No joint pain or swelling.  No decreased range of motion.  No back pain.    Physical Exam  BP 122/64 (BP  Location: Left Arm, Cuff Size: Normal)   Pulse 64   Temp 97.8 F (36.6 C) (Temporal)   Ht _0  (1.626 m)   Wt 137 lb 12.8 oz (62.5 kg)   SpO2 95% Comment: RA  BMI 23.65 kg/m   GEN: A/Ox3; pleasant , NAD    HEENT:  [place]/AT,    NOSE-clear, THROAT-clear, no lesions, no postnasal drip or exudate noted.   NECK:  Supple w/ fair ROM; no JVD; normal carotid impulses w/o bruits; no thyromegaly or nodules palpated; no lymphadenopathy.    RESP  Clear  P & A; w/o, wheezes/ rales/ or rhonchi. no accessory muscle use, no dullness to percussion  CARD:  RRR, no m/r/g, no peripheral edema, pulses intact, no cyanosis or clubbing.  GI:   Soft & nt; nml bowel sounds; no organomegaly or masses detected.   Musco: Warm bil, no deformities or joint swelling noted.   Neuro: alert, no focal deficits noted.    Skin: Warm, no lesions or rashes    Lab Results:  CBC    Component Value Date/Time   WBC 6.0 01/02/2019 1005   WBC 5.4 05/29/2018 1232   RBC 4.70 01/02/2019 1005   RBC 4.78 05/29/2018 1232   HGB 14.5 01/02/2019 1005   HCT 41.9 01/02/2019 1005   PLT 154 01/02/2019 1005   MCV 89 01/02/2019 1005   MCH 30.9 01/02/2019 1005   MCH 29.1 05/29/2018 1232   MCHC 34.6 01/02/2019 1005   MCHC  31.7 05/29/2018 1232   RDW 12.2 01/02/2019 1005   LYMPHSABS 2.1 01/02/2019 1005   MONOABS 0.5 11/06/2017 2214   EOSABS 0.1 01/02/2019 1005   BASOSABS 0.0 01/02/2019 1005    BMET    Component Value Date/Time   NA 139 01/02/2019 1005   K 4.3 01/02/2019 1005   CL 104 01/02/2019 1005   CO2 24 01/02/2019 1005   GLUCOSE 89 01/02/2019 1005   GLUCOSE 101 (H) 05/29/2018 1232   BUN 15 01/02/2019 1005   CREATININE 0.88 01/02/2019 1005   CREATININE 0.78 02/21/2018 0845   CALCIUM 9.2 01/02/2019 1005   GFRNONAA 69 01/02/2019 1005   GFRNONAA 80 02/13/2018 1148   GFRAA 80 01/02/2019 1005   GFRAA 93 02/13/2018 1148    BNP No results found for: BNP  ProBNP No results found for: PROBNP  Imaging: No results found.    No flowsheet data found.  No results found for: NITRICOXIDE      Assessment & Plan:   COPD (chronic obstructive pulmonary disease) ([place]) Currently compensated on present regimen. Increase flutter valve to 2 3 times a day.  Plan  Patient Instructions  Flu shot today.  Continue on TRELEGY 1 puff daily , rinse after use.  Flutter valve 2-3 times a day.  Continue Ethambutol, Rifampin and Azithromycin 3 days a week.  Follow up with ID clinic as planned.  Mucinex daily As needed  Congestion .  Fluids and rest  Advance activity as tolerated.  Follow up with Dr. Valeta Harms as planned next month and As needed           MAI (mycobacterium avium-intracellulare) [facility]) Pulmonary MAI-followed in ID clinic Recently started triple therapy last month.  She seems to be tolerating well. Follow-up with labs as planned.  Plan  Patient Instructions  Flu shot today.  Continue on TRELEGY 1 puff daily , rinse after use.  Flutter valve 2-3 times a day.  Continue Ethambutol, Rifampin and Azithromycin 3 days a week.  Follow up  with ID clinic as planned.  Mucinex daily As needed  Congestion .  Fluids and rest  Advance activity as tolerated.  Follow up with Dr. Valeta Harms as  planned next month and As needed               Rexene Edison, NP 03/15/2019

## 2019-03-15 NOTE — Assessment & Plan Note (Signed)
Pulmonary MAI-followed in ID clinic Recently started triple therapy last month.  She seems to be tolerating well. Follow-up with labs as planned.  Plan  Patient Instructions  Flu shot today.  Continue on TRELEGY 1 puff daily , rinse after use.  Flutter valve 2-3 times a day.  Continue Ethambutol, Rifampin and Azithromycin 3 days a week.  Follow up with ID clinic as planned.  Mucinex daily As needed  Congestion .  Fluids and rest  Advance activity as tolerated.  Follow up with Dr. Valeta Harms as planned next month and As needed

## 2019-03-16 NOTE — Progress Notes (Signed)
PCCM: Thanks for seeing her glad to establish care. Garner Nash, DO [facility] 03/16/2019 2:47 PM

## 2019-03-19 ENCOUNTER — Telehealth: Payer: Self-pay

## 2019-03-19 NOTE — Telephone Encounter (Signed)
Noted by triage Nothing further needed  Will sign off

## 2019-03-19 NOTE — Telephone Encounter (Signed)
PCCM:  Also, I am happy to see patient. Just thought it would be more appropriate and provider specific.   [facility] 03/19/2019 1:34 PM

## 2019-03-19 NOTE — Telephone Encounter (Signed)
-----   Message from Garner Nash, DO sent at 03/18/2019  2:20 PM EST ----- Hello,  Please see message below. Patient scheduled to see me on the 13th but she has MAI and this is a disease that [facility] is establishing a cohort of patients with bronchiectasis and MAC.  She is also in clinic that day so I would switch her appt to see Dr. Carlis Abbott instead of me to establish care.  Lance Muss, DO [facility] 03/18/2019 2:22 PM     ----- Message ----- From: Julian Hy, DO Sent: 03/18/2019  12:37 PM EST To: Octavio Graves Icard, DO  Yes, that's fine. I am not back in clinic until the end of the month, but I am happy to see her.  [facility] ----- Message ----- From: Garner Nash, DO Sent: 03/16/2019   2:48 PM EST To: Julian Hy, DO   Paige,  She is scheduled to see me but she has MAI/COPD being treated by ID. So, may be better for you to see and establish with instead of me. I am fine seeing too. But I thought I would share if you wanted. Lance Muss, DO [facility] 03/16/2019 2:49 PM     ----- Message ----- From: Melvenia Needles, NP Sent: 03/15/2019   3:32 PM EST To: Garner Nash, DO

## 2019-03-19 NOTE — Telephone Encounter (Signed)
I called and spoke to patient. I let patient know that we could keep her appointment for the same date and time and move her to Dr. Ainsley Spinner schedule since this is a focus of patients that Dr. Carlis Abbott is going to be working with.  Patient declined. Patient stated that she would like to speak to [facility] personally but would not disclose anything further. Offered a televisit since she wants to speak with provider but patient declined that as well.   Routing to TP and BI.

## 2019-03-19 NOTE — Telephone Encounter (Signed)
That is fine , Dr. Lenna Gilford  Had requested you and told to her to set up with you that was the reason for referral.  Ill get her switched.   Spoke with patient please leave with Dr. Valeta Harms

## 2019-03-20 ENCOUNTER — Telehealth: Payer: Self-pay | Admitting: *Deleted

## 2019-03-20 ENCOUNTER — Other Ambulatory Visit: Payer: Self-pay

## 2019-03-20 DIAGNOSIS — J449 Chronic obstructive pulmonary disease, unspecified: Secondary | ICD-10-CM

## 2019-03-20 MED ORDER — PANTOPRAZOLE SODIUM 40 MG PO TBEC: 40.0000 mg | DELAYED_RELEASE_TABLET | Freq: Every day | ORAL | 1 refills | Status: DC

## 2019-03-20 NOTE — Telephone Encounter (Signed)
-----   Message from Orpah Melter, CPhT sent at 03/20/2019 11:51 AM EST ----- Regarding: Patient requesting return call She called asking for a return call to [phone]  [facility] [place] Patient [facility] Phone: [phone] Fax:  [phone]

## 2019-03-20 NOTE — Telephone Encounter (Signed)
RN returned call, went to voicemail.  I left a message asking her to call back or that she could send a mychart message with her question. Landis Gandy, RN

## 2019-03-22 LAB — AFB ORGANISM ID BY DNA PROBE
M avium complex: POSITIVE — AB
M tuberculosis complex: NEGATIVE

## 2019-03-22 LAB — ACID FAST SMEAR (AFB, MYCOBACTERIA): Acid Fast Smear: NEGATIVE

## 2019-03-22 LAB — FUNGUS CULTURE WITH STAIN

## 2019-03-22 LAB — FUNGUS CULTURE RESULT

## 2019-03-22 LAB — MAC SUSCEPTIBILITY BROTH
Clarithromycin: 2
Linezolid: 32
Moxifloxacin: 4
Streptomycin: 64

## 2019-03-22 LAB — ACID FAST CULTURE WITH REFLEXED SENSITIVITIES (MYCOBACTERIA): Acid Fast Culture: POSITIVE — AB

## 2019-03-22 LAB — FUNGAL ORGANISM REFLEX

## 2019-04-08 ENCOUNTER — Other Ambulatory Visit: Payer: Self-pay | Admitting: Family Medicine

## 2019-04-15 ENCOUNTER — Encounter: Payer: Self-pay | Admitting: Infectious Disease

## 2019-04-15 ENCOUNTER — Telehealth: Payer: Self-pay

## 2019-04-15 ENCOUNTER — Ambulatory Visit (INDEPENDENT_AMBULATORY_CARE_PROVIDER_SITE_OTHER): Payer: Medicare Other | Admitting: Infectious Disease

## 2019-04-15 ENCOUNTER — Other Ambulatory Visit: Payer: Self-pay

## 2019-04-15 VITALS — BP 153/76 | HR 70 | Temp 97.6°F | Resp 12 | Ht 64.0 in | Wt 137.0 lb

## 2019-04-15 DIAGNOSIS — R9389 Abnormal findings on diagnostic imaging of other specified body structures: Secondary | ICD-10-CM | POA: Diagnosis not present

## 2019-04-15 DIAGNOSIS — A310 Pulmonary mycobacterial infection: Secondary | ICD-10-CM | POA: Diagnosis present

## 2019-04-15 DIAGNOSIS — I712 Thoracic aortic aneurysm, without rupture, unspecified: Secondary | ICD-10-CM

## 2019-04-15 MED ORDER — ETHAMBUTOL HCL 400 MG PO TABS: 800.0000 mg | ORAL_TABLET | Freq: Every day | ORAL | 11 refills | Status: DC

## 2019-04-15 MED ORDER — AZITHROMYCIN 500 MG PO TABS: 500.0000 mg | ORAL_TABLET | Freq: Every day | ORAL | 11 refills | Status: DC

## 2019-04-15 MED ORDER — RIFAMPIN 300 MG PO CAPS: 600.0000 mg | ORAL_CAPSULE | Freq: Every day | ORAL | 11 refills | Status: DC

## 2019-04-15 MED ORDER — ETHAMBUTOL HCL 100 MG PO TABS: 100.0000 mg | ORAL_TABLET | Freq: Every day | ORAL | 11 refills | Status: DC

## 2019-04-15 NOTE — Telephone Encounter (Signed)
Patient was seen today by Dr. Tommy Medal and prescribed Ethambutol, Rifampin, and Azithromycin. Patient request Rx to be written for 90 Day supply versus 30 day supply.  Routing to provider for verbal order. Katrina Campbell

## 2019-04-15 NOTE — Progress Notes (Signed)
Subjective:   Chief complaint: persistent coughing, malaise    Patient ID: Katrina Campbell, female    DOB: 12-11-1955, 64 y.o.   MRN: [id number]  HPI  64 year old female, former smoker quit 2014. 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  She is currently been on a 3 times a week regimen over the last nearly 3 months but has not had a great deal of improvement some improvement but she still continues to cough quite a bit as keeps her up at night.  She is lost a small amount of weight which he attributes to appetite.  She does have some significant nausea with the medicines and also has difficulty taking large ethambutol pills.       Past Medical History:  Diagnosis Date  . Anxiety   . COPD (chronic obstructive pulmonary disease) ([place])   . Depression   . Hyperlipidemia   . Labile hypertension   . 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 LEFT HEART CATH AND CORONARY ANGIOGRAPHY N/A 11/07/2017   Procedure: LEFT HEART CATH AND CORONARY ANGIOGRAPHY;  Surgeon: Nelva Bush, MD;  Location: [facility];  Service: Cardiovascular;  Laterality: N/A;  . NASAL SINUS SURGERY  1986  . VIDEO BRONCHOSCOPY Bilateral 01/29/2019   Procedure: VIDEO BRONCHOSCOPY WITH FLUORO;  Surgeon: Marshell Garfinkel, MD;  Location: [facility];  Service: Cardiopulmonary;  Laterality: Bilateral;    Family History  Problem Relation Age of Onset  . Heart attack Mother   . Emphysema Mother   . Congestive Heart Failure Mother   . Heart attack Father   . Congestive Heart Failure Father   . Asthma Other   . Lung disease Sister   . Lung disease Brother   . Colon cancer Neg Hx       Social History   Socioeconomic History  . Marital status: Married   Spouse name: Not on file  . Number of children: Not on file  . Years of education: Not on file  . Highest education level: Not on file  Occupational History  . Not on file  Tobacco Use  . Smoking status: Former Smoker    Packs/day: 1.00    Years: 40.00    Pack years: 40.00    Types: Cigarettes    Quit date: 11/09/2012    Years since quitting: 6.4  . Smokeless tobacco: Never Used  . Tobacco comment: vapor  Substance and Sexual Activity  . Alcohol use: Yes    Alcohol/week: 24.0 standard drinks    Types: 24 Cans of beer per week  . Drug use: No  . Sexual activity: Not Currently  Other Topics Concern  . Not on file  Social History Narrative  . Not on file   Social Determinants of Health   Financial Resource Strain:   . Difficulty of Paying Living Expenses: Not on file  Food Insecurity:   . Worried About Charity fundraiser in the Last Year: Not on file  . Ran Out of Food in the Last Year: Not on file  Transportation Needs:   . Lack of Transportation (Medical): Not on file  . Lack of Transportation (Non-Medical): Not on file  Physical Activity:   . Days of Exercise per Week: Not on file  . Minutes of Exercise per Session:  Not on file  Stress:   . Feeling of Stress : Not on file  Social Connections:   . Frequency of Communication with Friends and Family: Not on file  . Frequency of Social Gatherings with Friends and Family: Not on file  . Attends Religious Services: Not on file  . Active Member of Clubs or Organizations: Not on file  . Attends Archivist Meetings: Not on file  . Marital Status: Not on file    No Known Allergies   Current Outpatient Medications:  .  albuterol (PROAIR HFA) 108 (90 BASE) MCG/ACT inhaler, Inhale 2 puffs into the lungs every 6 (six) hours as needed for wheezing or shortness of breath., Disp: 18 g, Rfl: 3 .  aspirin EC 81 MG tablet, Take 81 mg by mouth daily with lunch. , Disp: , Rfl:  .  azithromycin (ZITHROMAX) 500 MG tablet,  Take 1 tablet (500 mg total) by mouth 3 (three) times a week., Disp: 45 tablet, Rfl: 2 .  buPROPion (WELLBUTRIN XL) 300 MG 24 hr tablet, TAKE 1 TABLET DAILY, Disp: 90 tablet, Rfl: 3 .  Calcium Carb-Cholecalciferol (CALCIUM-VITAMIN D) 500-400 MG-UNIT TABS, 1 a day (Patient taking differently: Take 1 tablet by mouth daily with lunch. ), Disp: 60 tablet, Rfl:  .  chlorpheniramine (CHLOR-TRIMETON) 4 MG tablet, Take 2 tablets (8 mg total) by mouth 3 (three) times daily as needed for allergies. (Patient not taking: Reported on 03/15/2019), Disp: 60 tablet, Rfl: 1 .  chlorpheniramine-HYDROcodone (TUSSIONEX PENNKINETIC ER) 10-8 MG/5ML SUER, Take 5 mLs by mouth at bedtime as needed for cough. (Patient not taking: Reported on 03/15/2019), Disp: 140 mL, Rfl: 0 .  fluticasone (FLONASE) 50 MCG/ACT nasal spray, Place 2 sprays into both nostrils as needed for allergies or rhinitis., Disp: , Rfl:  .  Fluticasone-Umeclidin-Vilant (TRELEGY ELLIPTA) 100-62.5-25 MCG/INH AEPB, , Disp: , Rfl:  .  furosemide (LASIX) 20 MG tablet, Take 1 tablet (20 mg total) by mouth daily as needed for edema (Swelling)., Disp: 30 tablet, Rfl: 6 .  guaiFENesin (MUCINEX) 600 MG 12 hr tablet, Take 600 mg by mouth 2 (two) times daily. , Disp: , Rfl:  .  ibuprofen (ADVIL) 200 MG tablet, Take 400 mg by mouth every 8 (eight) hours as needed (pain/fever). , Disp: , Rfl:  .  Magnesium 500 MG TABS, Take 500 mg by mouth daily with lunch., Disp: , Rfl:  .  Omega-3 Fatty Acids (FISH OIL) 1000 MG CPDR, Take 1,000 mg by mouth daily with lunch., Disp: , Rfl:  .  pantoprazole (PROTONIX) 40 MG tablet, Take 1 tablet (40 mg total) by mouth daily., Disp: 90 tablet, Rfl: 1 .  Respiratory Therapy Supplies (FLUTTER) DEVI, Use 3 times daily as directed to loosen sputum in chest., Disp: 1 each, Rfl: 0 .  rifampin (RIFADIN) 300 MG capsule, Take 2 capsules (600 mg total) by mouth 3 (three) times a week., Disp: 72 capsule, Rfl: 2 .  rosuvastatin (CRESTOR) 10 MG tablet,  TAKE 1 TABLET DAILY AT 6 P.M., Disp: 90 tablet, Rfl: 3 .  traZODone (DESYREL) 50 MG tablet, Take 0.5-1 tablets (25-50 mg total) by mouth at bedtime as needed for sleep., Disp: 30 tablet, Rfl: 3 .  vitamin C (ASCORBIC ACID) 500 MG tablet, Take 500 mg by mouth daily. , Disp: , Rfl:    Review of Systems  Constitutional: Negative for chills and fever.  HENT: Negative for congestion and sore throat.   Eyes: Negative for photophobia.  Respiratory: Positive for cough,  chest tightness and shortness of breath. Negative for wheezing.   Cardiovascular: Negative for chest pain, palpitations and leg swelling.  Gastrointestinal: Positive for nausea. Negative for abdominal pain, blood in stool, constipation, diarrhea and vomiting.  Genitourinary: Negative for dysuria, flank pain and hematuria.  Musculoskeletal: Negative for back pain and myalgias.  Skin: Negative for rash.  Neurological: Negative for dizziness, weakness and headaches.  Hematological: Does not bruise/bleed easily.  Psychiatric/Behavioral: Negative for suicidal ideas.       Objective:   Physical Exam Constitutional:      General: She is not in acute distress.    Appearance: She is not diaphoretic.  HENT:     Head: Normocephalic and atraumatic.     Right Ear: External ear normal.     Left Ear: External ear normal.     Nose: Nose normal.     Mouth/Throat:     Pharynx: No oropharyngeal exudate.  Eyes:     General: No scleral icterus.    Conjunctiva/sclera: Conjunctivae normal.     Pupils: Pupils are equal, round, and reactive to light.  Cardiovascular:     Rate and Rhythm: Normal rate and regular rhythm.     Heart sounds: Normal heart sounds. No murmur. No friction rub. No gallop.   Pulmonary:     Effort: Prolonged expiration present. No respiratory distress.     Breath sounds: Decreased air movement present. No stridor. No wheezing.  Abdominal:     General: Bowel sounds are normal.     Palpations: Abdomen is soft.    Musculoskeletal:        General: No tenderness. Normal range of motion.     Cervical back: Normal range of motion and neck supple.  Lymphadenopathy:     Cervical: No cervical adenopathy.  Skin:    General: Skin is warm and dry.     Coloration: Skin is not pale.     Findings: No erythema or rash.  Neurological:     General: No focal deficit present.     Mental Status: She is alert and oriented to person, place, and time.     Coordination: Coordination normal.  Psychiatric:        Attention and Perception: Attention and perception normal.        Mood and Affect: Mood is depressed.        Speech: Speech normal.        Behavior: Behavior normal.        Thought Content: Thought content normal.        Cognition and Memory: Cognition and memory normal.        Judgment: Judgment normal.           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  I will check liver function test today.  I have also asked her if she sees her pulmonary doctor in the next month or 2 to asked them to check LFTs at that time.  I will plan on seeing her back in 3 months time  GERD: Not so sure that she really has reflux disease and think reflux disease was brought out as a potential cause of cough.  I would consider weaning her  off this drug to reduce risk of C. difficile colitis infection.

## 2019-04-16 ENCOUNTER — Other Ambulatory Visit: Payer: Medicare Other

## 2019-04-16 DIAGNOSIS — R7989 Other specified abnormal findings of blood chemistry: Secondary | ICD-10-CM

## 2019-04-16 LAB — COMPLETE METABOLIC PANEL WITH GFR
AG Ratio: 1.7 (calc) (ref 1.0–2.5)
ALT: 20 U/L (ref 6–29)
AST: 26 U/L (ref 10–35)
Albumin: 4.3 g/dL (ref 3.6–5.1)
Alkaline phosphatase (APISO): 72 U/L (ref 37–153)
BUN: 14 mg/dL (ref 7–25)
CO2: 30 mmol/L (ref 20–32)
Calcium: 9.3 mg/dL (ref 8.6–10.4)
Chloride: 105 mmol/L (ref 98–110)
Creat: 0.8 mg/dL (ref 0.50–0.99)
GFR, Est African American: 90 mL/min/{1.73_m2} (ref >=60)
GFR, Est Non African American: 77 mL/min/{1.73_m2} (ref >=60)
Globulin: 2.5 g/dL (calc) (ref 1.9–3.7)
Glucose, Bld: 98 mg/dL (ref 65–99)
Potassium: 4.1 mmol/L (ref 3.5–5.3)
Sodium: 141 mmol/L (ref 135–146)
Total Bilirubin: 0.6 mg/dL (ref 0.2–1.2)
Total Protein: 6.8 g/dL (ref 6.1–8.1)

## 2019-04-16 MED ORDER — ETHAMBUTOL HCL 100 MG PO TABS: 100.0000 mg | ORAL_TABLET | Freq: Every day | ORAL | 3 refills | Status: DC

## 2019-04-16 MED ORDER — ETHAMBUTOL HCL 400 MG PO TABS: 800.0000 mg | ORAL_TABLET | Freq: Every day | ORAL | 3 refills | Status: AC

## 2019-04-16 MED ORDER — AZITHROMYCIN 500 MG PO TABS: 500.0000 mg | ORAL_TABLET | Freq: Every day | ORAL | 3 refills | Status: DC

## 2019-04-16 NOTE — Telephone Encounter (Signed)
Patient made aware of 90 day supply sent to requested pharmacy [facility]

## 2019-04-16 NOTE — Telephone Encounter (Signed)
90-day supplies completely fine with me

## 2019-04-16 NOTE — Addendum Note (Signed)
Addended by: Eugenia Mcalpine on: 04/16/2019 10:55 AM   Modules accepted: Orders

## 2019-04-16 NOTE — Addendum Note (Signed)
Addended by: Eugenia Mcalpine on: 04/16/2019 02:41 PM   Modules accepted: Orders

## 2019-04-17 ENCOUNTER — Other Ambulatory Visit: Payer: Self-pay

## 2019-04-17 ENCOUNTER — Ambulatory Visit (INDEPENDENT_AMBULATORY_CARE_PROVIDER_SITE_OTHER): Payer: Medicare Other | Admitting: Pulmonary Disease

## 2019-04-17 ENCOUNTER — Encounter: Payer: Self-pay | Admitting: Pulmonary Disease

## 2019-04-17 VITALS — BP 118/70 | HR 65 | Temp 98.3°F | Ht 64.0 in | Wt 137.4 lb

## 2019-04-17 DIAGNOSIS — A310 Pulmonary mycobacterial infection: Secondary | ICD-10-CM | POA: Diagnosis not present

## 2019-04-17 DIAGNOSIS — J449 Chronic obstructive pulmonary disease, unspecified: Secondary | ICD-10-CM | POA: Diagnosis not present

## 2019-04-17 DIAGNOSIS — J471 Bronchiectasis with (acute) exacerbation: Secondary | ICD-10-CM | POA: Diagnosis not present

## 2019-04-17 DIAGNOSIS — J479 Bronchiectasis, uncomplicated: Secondary | ICD-10-CM | POA: Insufficient documentation

## 2019-04-17 LAB — TEST AUTHORIZATION

## 2019-04-17 LAB — T3, FREE: T3, Free: 3.3 pg/mL (ref 2.3–4.2)

## 2019-04-17 LAB — TSH: TSH: 4.81 mIU/L — ABNORMAL HIGH (ref 0.40–4.50)

## 2019-04-17 LAB — T4, FREE: Free T4: 1.1 ng/dL (ref 0.8–1.8)

## 2019-04-17 MED ORDER — ANORO ELLIPTA 62.5-25 MCG/INH IN AEPB: 1.0000 | INHALATION_SPRAY | Freq: Every day | RESPIRATORY_TRACT | 0 refills | Status: DC

## 2019-04-17 MED ORDER — ANORO ELLIPTA 62.5-25 MCG/INH IN AEPB: 1.0000 | INHALATION_SPRAY | Freq: Every day | RESPIRATORY_TRACT | 3 refills | Status: DC

## 2019-04-17 MED ORDER — ALBUTEROL SULFATE (2.5 MG/3ML) 0.083% IN NEBU: 2.5000 mg | INHALATION_SOLUTION | Freq: Four times a day (QID) | RESPIRATORY_TRACT | 12 refills | Status: DC | PRN

## 2019-04-17 NOTE — Patient Instructions (Signed)
Thank you for visiting Dr. Valeta Harms at [facility]. Today we recommend the following:  New Nebulizer with tubing, supplies and albuterol solution New orders for vest therapy  Stop Trelegy Start Anoro, given samples today and new prescription   Return in about 8 weeks (around 06/12/2019) for with APP, Rexene Edison, NP .    Please do your part to reduce the spread of COVID-19.

## 2019-04-17 NOTE — Progress Notes (Signed)
Synopsis: Referred in January 2021 for former smoker quit 2014 moderate COPD, MAI by Alycia Rossetti, MD  Subjective:   PATIENT ID: Katrina Campbell GENDER: female DOB: [birth year], MRN: [id number]  Chief Complaint  Patient presents with  . Follow-up    Patient reports that she has sob and a cough. She has been Dx with MAI.     This is a 64 year old female moderate COPD, former smoker quit 2014, MAI patient last seen in the office by Dr. Vaughan Browner.  Former Dr. Lenna Gilford patient.  Treated for stenotrophomonas in the past.  Prior 3 sputum's AFB, 1/3+ for MAI.  Saw infectious disease to discuss initiation of therapy.  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  Patient was last seen by infectious disease on 04/15/2019.  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  Just started on azithromycin 500 mg daily, rifampin 600 mg daily plus ethambutol 900 mg daily.  Patient does have CT imaging with lower lobe bronchiectasis.  Patient has chronic sputum production.  She is short of breath with daily cough and sputum production.  During this time she is very anxious and hesitant about going out in public due to her chronic cough.  She denies hemoptysis patient's weight has also been stable.     Past Medical History:  Diagnosis Date  . Anxiety   . COPD (chronic obstructive pulmonary disease) ([place])   . Depression   . Hyperlipidemia   . Labile hypertension   . Pulmonary mycobacterial infection ([place]) 10/24/2018     Family History  Problem Relation Age of Onset  . Heart attack Mother   . Emphysema Mother   . Congestive Heart Failure Mother   . Heart attack Father   . Congestive Heart Failure Father   . Asthma Other   . Lung disease Sister   . Lung disease Brother   . Colon cancer Neg Hx      Past Surgical History:  Procedure Laterality Date  . LEFT  HEART CATH AND CORONARY ANGIOGRAPHY N/A 11/07/2017   Procedure: LEFT HEART CATH AND CORONARY ANGIOGRAPHY;  Surgeon: Nelva Bush, MD;  Location: [facility];  Service: Cardiovascular;  Laterality: N/A;  . NASAL SINUS SURGERY  1986  . VIDEO BRONCHOSCOPY Bilateral 01/29/2019   Procedure: VIDEO BRONCHOSCOPY WITH FLUORO;  Surgeon: Marshell Garfinkel, MD;  Location: [facility];  Service: Cardiopulmonary;  Laterality: Bilateral;    Social History   Socioeconomic History  . Marital status: Married    Spouse name: Not on file  . Number of children: Not on file  . Years of education: Not on file  . Highest education level: Not on file  Occupational History  . Not on file  Tobacco Use  . Smoking status: Former Smoker    Packs/day: 1.00    Years: 40.00    Pack years: 40.00    Types: Cigarettes    Quit date: 11/09/2012    Years since quitting: 6.4  . Smokeless tobacco: Never Used  . Tobacco comment: vapor  Substance and Sexual Activity  . Alcohol use: Yes    Alcohol/week: 24.0 standard drinks    Types: 24 Cans of beer per week  . Drug use: No  . Sexual activity: Not Currently  Other Topics Concern  . Not on file  Social History Narrative  . Not on file   Social Determinants of Health   Financial Resource  Strain:   . Difficulty of Paying Living Expenses: Not on file  Food Insecurity:   . Worried About Charity fundraiser in the Last Year: Not on file  . Ran Out of Food in the Last Year: Not on file  Transportation Needs:   . Lack of Transportation (Medical): Not on file  . Lack of Transportation (Non-Medical): Not on file  Physical Activity:   . Days of Exercise per Week: Not on file  . Minutes of Exercise per Session: Not on file  Stress:   . Feeling of Stress : Not on file  Social Connections:   . Frequency of Communication with Friends and Family: Not on file  . Frequency of Social Gatherings with Friends and Family: Not on file  . Attends Religious Services: Not  on file  . Active Member of Clubs or Organizations: Not on file  . Attends Archivist Meetings: Not on file  . Marital Status: Not on file  Intimate Partner Violence:   . Fear of Current or Ex-Partner: Not on file  . Emotionally Abused: Not on file  . Physically Abused: Not on file  . Sexually Abused: Not on file     No Known Allergies   Outpatient Medications Prior to Visit  Medication Sig Dispense Refill  . albuterol (PROAIR HFA) 108 (90 BASE) MCG/ACT inhaler Inhale 2 puffs into the lungs every 6 (six) hours as needed for wheezing or shortness of breath. 18 g 3  . aspirin EC 81 MG tablet Take 81 mg by mouth daily with lunch.     Marland Kitchen azithromycin (ZITHROMAX) 500 MG tablet Take 1 tablet (500 mg total) by mouth daily. 90 tablet 3  . buPROPion (WELLBUTRIN XL) 300 MG 24 hr tablet TAKE 1 TABLET DAILY 90 tablet 3  . Calcium Carb-Cholecalciferol (CALCIUM-VITAMIN D) 500-400 MG-UNIT TABS 1 a day (Patient taking differently: Take 1 tablet by mouth daily with lunch. ) 60 tablet   . ethambutol (MYAMBUTOL) 100 MG tablet Take 1 tablet (100 mg total) by mouth daily. Take with 2 423m tabs for 9043mtotal dose 90 tablet 3  . ethambutol (MYAMBUTOL) 400 MG tablet Take 2 tablets (800 mg total) by mouth daily. Take with 10069mablet of ethambutol for 900m67mtal 180 tablet 3  . fluticasone (FLONASE) 50 MCG/ACT nasal spray Place 2 sprays into both nostrils as needed for allergies or rhinitis.    . Fluticasone-Umeclidin-Vilant (TRELEGY ELLIPTA) 100-62.5-25 MCG/INH AEPB     . furosemide (LASIX) 20 MG tablet Take 1 tablet (20 mg total) by mouth daily as needed for edema (Swelling). 30 tablet 6  . guaiFENesin (MUCINEX) 600 MG 12 hr tablet Take 600 mg by mouth 2 (two) times daily.     . Magnesium 500 MG TABS Take 500 mg by mouth daily with lunch.    . Respiratory Therapy Supplies (FLUTTER) DEVI Use 3 times daily as directed to loosen sputum in chest. 1 each 0  . rifampin (RIFADIN) 300 MG capsule Take 2  capsules (600 mg total) by mouth daily. 60 capsule 11  . rosuvastatin (CRESTOR) 10 MG tablet TAKE 1 TABLET DAILY AT 6 P.M. 90 tablet 3  . traZODone (DESYREL) 50 MG tablet Take 0.5-1 tablets (25-50 mg total) by mouth at bedtime as needed for sleep. 30 tablet 3  . vitamin C (ASCORBIC ACID) 500 MG tablet Take 500 mg by mouth daily.     . chlorpheniramine (CHLOR-TRIMETON) 4 MG tablet Take 2 tablets (8 mg total) by mouth  3 (three) times daily as needed for allergies. 60 tablet 1  . chlorpheniramine-HYDROcodone (TUSSIONEX PENNKINETIC ER) 10-8 MG/5ML SUER Take 5 mLs by mouth at bedtime as needed for cough. (Patient not taking: Reported on 03/15/2019) 140 mL 0  . ibuprofen (ADVIL) 200 MG tablet Take 400 mg by mouth every 8 (eight) hours as needed (pain/fever).     . Omega-3 Fatty Acids (FISH OIL) 1000 MG CPDR Take 1,000 mg by mouth daily with lunch.    . pantoprazole (PROTONIX) 40 MG tablet Take 1 tablet (40 mg total) by mouth daily. 90 tablet 1   No facility-administered medications prior to visit.    Review of Systems  Constitutional: Negative for chills, fever, malaise/fatigue and weight loss.  HENT: Negative for hearing loss, sore throat and tinnitus.   Eyes: Negative for blurred vision and double vision.  Respiratory: Positive for cough, sputum production and shortness of breath. Negative for hemoptysis, wheezing and stridor.   Cardiovascular: Negative for chest pain, palpitations, orthopnea, leg swelling and PND.  Gastrointestinal: Negative for abdominal pain, constipation, diarrhea, heartburn, nausea and vomiting.  Genitourinary: Negative for dysuria, hematuria and urgency.  Musculoskeletal: Negative for joint pain and myalgias.  Skin: Negative for itching and rash.  Neurological: Negative for dizziness, tingling, weakness and headaches.  Endo/Heme/Allergies: Negative for environmental allergies. Does not bruise/bleed easily.  Psychiatric/Behavioral: Negative for depression. The patient is  not nervous/anxious and does not have insomnia.   All other systems reviewed and are negative.    Objective:  Physical Exam Vitals reviewed.  Constitutional:      General: She is not in acute distress.    Appearance: She is well-developed.  HENT:     Head: Normocephalic and atraumatic.  Eyes:     General: No scleral icterus.    Conjunctiva/sclera: Conjunctivae normal.     Pupils: Pupils are equal, round, and reactive to light.  Neck:     Vascular: No JVD.     Trachea: No tracheal deviation.  Cardiovascular:     Rate and Rhythm: Normal rate and regular rhythm.     Heart sounds: Normal heart sounds. No murmur.  Pulmonary:     Effort: Pulmonary effort is normal. No tachypnea, accessory muscle usage or respiratory distress.     Breath sounds: No stridor. Rhonchi present. No wheezing or rales.     Comments: Bilateral rhonchi, tubular breath sounds Abdominal:     General: Bowel sounds are normal. There is no distension.     Palpations: Abdomen is soft.     Tenderness: There is no abdominal tenderness.  Musculoskeletal:        General: No tenderness.     Cervical back: Neck supple.  Lymphadenopathy:     Cervical: No cervical adenopathy.  Skin:    General: Skin is warm and dry.     Capillary Refill: Capillary refill takes less than 2 seconds.     Findings: No rash.  Neurological:     Mental Status: She is alert and oriented to person, place, and time.  Psychiatric:        Behavior: Behavior normal.      Vitals:   04/17/19 1120  BP: 118/70  Pulse: 65  Temp: 98.3 F (36.8 C)  TempSrc: Temporal  SpO2: 97%  Weight: 137 lb 6.4 oz (62.3 kg)  Height: _0  (1.626 m)   97% on RA BMI Readings from Last 3 Encounters:  04/17/19 23.58 kg/m  04/15/19 23.52 kg/m  03/15/19 23.65 kg/m   Wt Readings  from Last 3 Encounters:  04/17/19 137 lb 6.4 oz (62.3 kg)  04/15/19 137 lb (62.1 kg)  03/15/19 137 lb 12.8 oz (62.5 kg)     CBC    Component Value Date/Time   WBC 6.0  01/02/2019 1005   WBC 5.4 05/29/2018 1232   RBC 4.70 01/02/2019 1005   RBC 4.78 05/29/2018 1232   HGB 14.5 01/02/2019 1005   HCT 41.9 01/02/2019 1005   PLT 154 01/02/2019 1005   MCV 89 01/02/2019 1005   MCH 30.9 01/02/2019 1005   MCH 29.1 05/29/2018 1232   MCHC 34.6 01/02/2019 1005   MCHC 31.7 05/29/2018 1232   RDW 12.2 01/02/2019 1005   LYMPHSABS 2.1 01/02/2019 1005   MONOABS 0.5 11/06/2017 2214   EOSABS 0.1 01/02/2019 1005   BASOSABS 0.0 01/02/2019 1005    Chest Imaging:  May 2020 CT chest: Scattered nodules.  Lower lobe small areas of bronchiectasis. The patient's images have been independently reviewed by me.    Pulmonary Functions Testing Results: No flowsheet data found.  FeNO: none   Pathology: none   Echocardiogram: none  Heart Catheterization: none     Assessment & Plan:     ICD-10-CM   1. Bronchiectasis with acute exacerbation ([place])  J47.1   2. Stage 2 moderate COPD by GOLD classification ([place])  J44.9   3. Pulmonary Mycobacterium avium complex (MAC) infection ([place])  A31.0     Discussion:  This is a 64 year old female with moderate stage COPD based on spirometry from 2016 with an FEV1 of 66%.  Longstanding history of smoking with CT imaging consistent with cysts severe emphysema.  She is currently managed with triple therapy inhaler regimen.  She is also on triple therapy antibiotic regimen for the treatment of her Mycobacterium avium complex.  She has chronic sputum production and respiratory symptoms due to this.  Plan: Antibiotics managed by infectious disease at this time. As for her respiratory symptoms I think we should come off of the inhaled steroid located in Trelegy. We will switch patient to Anoro Ellipta. Continue use of flutter valve for airway clearance and pulmonary toileting. We will start paperwork to see if we can get vest therapy approved.  She is still having ongoing sputum production due to MAI infection.  I think the patient would  benefit from vest therapy for airway clearance technique purposes.  She does not have anyone to complete chest PT at home. We will also give her a new prescription for a albuterol nebulizer.  She can use this before she does this therapy to help open her airways and improve her airway clearance.  Greater than 50% of this patient's 40-minute office visit was spent face-to-face discussing the above treatment plan and recommendations.  We also discussed the use of vest therapy.  Also the importance of maintaining her current inhaler regimen and antibiotics.  We also reviewed patient's CT imaging today in the office.   Current Outpatient Medications:  .  albuterol (PROAIR HFA) 108 (90 BASE) MCG/ACT inhaler, Inhale 2 puffs into the lungs every 6 (six) hours as needed for wheezing or shortness of breath., Disp: 18 g, Rfl: 3 .  aspirin EC 81 MG tablet, Take 81 mg by mouth daily with lunch. , Disp: , Rfl:  .  azithromycin (ZITHROMAX) 500 MG tablet, Take 1 tablet (500 mg total) by mouth daily., Disp: 90 tablet, Rfl: 3 .  buPROPion (WELLBUTRIN XL) 300 MG 24 hr tablet, TAKE 1 TABLET DAILY, Disp: 90 tablet, Rfl: 3 .  Calcium Carb-Cholecalciferol (CALCIUM-VITAMIN D) 500-400 MG-UNIT TABS, 1 a day (Patient taking differently: Take 1 tablet by mouth daily with lunch. ), Disp: 60 tablet, Rfl:  .  ethambutol (MYAMBUTOL) 100 MG tablet, Take 1 tablet (100 mg total) by mouth daily. Take with 2 464m tabs for 9024mtotal dose, Disp: 90 tablet, Rfl: 3 .  ethambutol (MYAMBUTOL) 400 MG tablet, Take 2 tablets (800 mg total) by mouth daily. Take with 10049mablet of ethambutol for 900m22mtal, Disp: 180 tablet, Rfl: 3 .  fluticasone (FLONASE) 50 MCG/ACT nasal spray, Place 2 sprays into both nostrils as needed for allergies or rhinitis., Disp: , Rfl:  .  Fluticasone-Umeclidin-Vilant (TRELEGY ELLIPTA) 100-62.5-25 MCG/INH AEPB, , Disp: , Rfl:  .  furosemide (LASIX) 20 MG tablet, Take 1 tablet (20 mg total) by mouth daily as  needed for edema (Swelling)., Disp: 30 tablet, Rfl: 6 .  guaiFENesin (MUCINEX) 600 MG 12 hr tablet, Take 600 mg by mouth 2 (two) times daily. , Disp: , Rfl:  .  Magnesium 500 MG TABS, Take 500 mg by mouth daily with lunch., Disp: , Rfl:  .  Respiratory Therapy Supplies (FLUTTER) DEVI, Use 3 times daily as directed to loosen sputum in chest., Disp: 1 each, Rfl: 0 .  rifampin (RIFADIN) 300 MG capsule, Take 2 capsules (600 mg total) by mouth daily., Disp: 60 capsule, Rfl: 11 .  rosuvastatin (CRESTOR) 10 MG tablet, TAKE 1 TABLET DAILY AT 6 P.M., Disp: 90 tablet, Rfl: 3 .  traZODone (DESYREL) 50 MG tablet, Take 0.5-1 tablets (25-50 mg total) by mouth at bedtime as needed for sleep., Disp: 30 tablet, Rfl: 3 .  vitamin C (ASCORBIC ACID) 500 MG tablet, Take 500 mg by mouth daily. , Disp: , Rfl:    BradGarner Nash [facility] 04/17/2019 11:45 AM

## 2019-04-23 ENCOUNTER — Telehealth: Payer: Self-pay | Admitting: Pulmonary Disease

## 2019-04-23 ENCOUNTER — Other Ambulatory Visit: Payer: Medicare Other

## 2019-04-23 NOTE — Telephone Encounter (Signed)
Spoke with pt, she stated that she didn't receive her nebulizer machine. I called [facility] and they have no record of an order for nebulizer. Rodena Piety did you get a CMN for this. In the referral tab, it looks like someone confirmed that it was received.

## 2019-04-23 NOTE — Telephone Encounter (Signed)
[phone] pt calling back

## 2019-04-23 NOTE — Telephone Encounter (Signed)
The order for the nebulizer was sent to [facility]. ATC pt, no answer. Left message for pt to call back.

## 2019-04-24 NOTE — Telephone Encounter (Signed)
No I don't have anything on this patient for Dr. Valeta Harms to sign

## 2019-04-24 NOTE — Telephone Encounter (Signed)
Pt is calling back - she never received a call back yesterday - also would like to know the status of the vest - CB# [phone]

## 2019-04-25 ENCOUNTER — Telehealth: Payer: Self-pay | Admitting: *Deleted

## 2019-04-25 NOTE — Telephone Encounter (Signed)
PCC's can we send order again/

## 2019-04-25 NOTE — Telephone Encounter (Signed)
Received call from patient.   Reports that she has been taking Trazodone to help her sleep, but she does not think it is working. Reports that she does have a cough at night that is making it difficult to sleep. Also states that if cough is not keeping her up, she does have a million thoughts in her head.   MD please advise.

## 2019-04-25 NOTE — Telephone Encounter (Signed)
Please see Levada Dy from [employer]'s response.  [place], Lanney Gins, Melissa; [place], Jeanie Cooks, Stephani Police  note in order from retail store says- [facility] out of stock. Shipping to patient      I sent her a message back asking if anyone let the patient know this issue

## 2019-04-25 NOTE — Telephone Encounter (Signed)
This order has been resent to [employer] as an urgent message

## 2019-04-26 ENCOUNTER — Other Ambulatory Visit: Payer: Self-pay

## 2019-04-26 MED ORDER — TEMAZEPAM 15 MG PO CAPS: 15.0000 mg | ORAL_CAPSULE | Freq: Every evening | ORAL | 1 refills | Status: DC | PRN

## 2019-04-26 NOTE — Telephone Encounter (Signed)
Pt notified Verbalizes understanding 

## 2019-04-26 NOTE — Telephone Encounter (Signed)
Spoke with pt and she was advised by [employer] that if she didn't get the nebulizer by Tuesday then to call them back. Pt understood and nothing further is needed.

## 2019-04-26 NOTE — Telephone Encounter (Signed)
D/C trazodone Start temazepam 1 hour before bedtime for sleep This is a long acting benzo for sleep/anxiety  Cough treated per her lung doctor

## 2019-04-29 ENCOUNTER — Other Ambulatory Visit: Payer: Self-pay

## 2019-04-29 DIAGNOSIS — A310 Pulmonary mycobacterial infection: Secondary | ICD-10-CM

## 2019-04-29 MED ORDER — RIFAMPIN 300 MG PO CAPS: 600.0000 mg | ORAL_CAPSULE | Freq: Every day | ORAL | 3 refills | Status: DC

## 2019-05-28 ENCOUNTER — Telehealth: Payer: Self-pay

## 2019-05-28 NOTE — Telephone Encounter (Signed)
Patient is calling to see if she should get COVID vaccine since she is on multiple medications and has MAI ?  Please advise.   Katrina Campbell K Katrina Bettenhausen,RN

## 2019-05-29 NOTE — Telephone Encounter (Signed)
Should definitely get Covid vaccinated

## 2019-05-29 NOTE — Telephone Encounter (Signed)
RN called patient, relayed Dr Lucianne Lei Dam's advice. Landis Gandy, RN

## 2019-06-12 ENCOUNTER — Encounter: Payer: Self-pay | Admitting: Adult Health

## 2019-06-12 ENCOUNTER — Other Ambulatory Visit: Payer: Self-pay | Admitting: *Deleted

## 2019-06-12 ENCOUNTER — Telehealth (INDEPENDENT_AMBULATORY_CARE_PROVIDER_SITE_OTHER): Payer: Medicare Other | Admitting: Adult Health

## 2019-06-12 DIAGNOSIS — J449 Chronic obstructive pulmonary disease, unspecified: Secondary | ICD-10-CM | POA: Diagnosis not present

## 2019-06-12 DIAGNOSIS — A310 Pulmonary mycobacterial infection: Secondary | ICD-10-CM | POA: Diagnosis not present

## 2019-06-12 DIAGNOSIS — Z87891 Personal history of nicotine dependence: Secondary | ICD-10-CM | POA: Diagnosis not present

## 2019-06-12 DIAGNOSIS — J479 Bronchiectasis, uncomplicated: Secondary | ICD-10-CM

## 2019-06-12 DIAGNOSIS — Z7982 Long term (current) use of aspirin: Secondary | ICD-10-CM | POA: Diagnosis not present

## 2019-06-12 MED ORDER — TEMAZEPAM 15 MG PO CAPS: 15.0000 mg | ORAL_CAPSULE | Freq: Every evening | ORAL | 0 refills | Status: DC | PRN

## 2019-06-12 NOTE — Progress Notes (Signed)
Virtual Visit via Video Note  I connected with Katrina Campbell on 06/12/19 at 10:30 AM EST by a video enabled telemedicine application and verified that I am speaking with the correct person using two identifiers.  Location: Patient: Home  Provider: Home    I discussed the limitations of evaluation and management by telemedicine and the availability of in person appointments. The patient expressed understanding and agreed to proceed.  History of Present Illness: 64 year old female former smoker (quit 2014) followed for moderate COPD (Gold 2), emphysema, lung nodules and MAI  Today's video visit is for a 39-monthfollow-up for COPD, bronchiectasis and pulmonary MAI.   [facility] visit [facility]  Goal was to remove her inhaled corticosteroid as she continues to undergo aggressive treatment for Mycobacterium avium complex.  Vest therapy was ordered last visit.. She is using the VEST Twice daily. Using Albuterol Neb daily.  Since last visit patient says she is doing better.  Feels that her cough has gotten significantly better since starting her current regimen with the vest therapy and flutter valve.  Also feels that the albuterol nebulizer is helping. She is on triple antibiotic therapy for her MAC, she says she is tolerating but has some side effects.  And has noticed that she has issues more with her teeth and is going to the dentist more often. [facility]  LFTs in January were normal.    Observations/Objective: Appears well with no distress noted    01/26/2019 - sarscov2 - negative  01/29/2019-bronchoscopy BAL-rare white blood cells present, consistent with normal respiratory flora Fungal-still pending,no fungus observed AFB-negative  09/10/2018-respiratory sputum culture-stenotrophomonas maltophilia, sensitive to Levaquin and Bactrim >>> EWtreated with Bactrim  Data Reviewed: Imaging: CT chest 05/21/2015-4 mm nodule in  the minor fissure. Emphysema. CT chest 05/31/2016-resolution of pulmonary nodule. Emphysema.  CT chest 11/07/2017-nodular densities in the right upper lobe. 5 mm nodule in the left lower lobe. Emphysema. Atelectasis in the right lower lobe and lingula. CT chest 02/22/2018-previously described lung nodules have resolved. New 8 mm left apical lung nodule. Emphysema CT chest 08/10/2018-improving size of left upper lobe nodule. Emphysema.  PFTs: Spirometry 02/10/2015 FVC 3.0 [97%], FEV1 1.6 [6 6%],F/F 54 Moderate obstruction.  Labs: CBC 02/13/2018-WBC 4.1, eos 0.7%, absolute eosinophil count 29  Sputum culture 6/8-stenotrophomonas, scant yeast Sputum AFB 6/8 x 3-negative Sputum AFB 6/24-MAI BAL 01/29/2019 -MAI +   Assessment and Plan: #1 COPD appears to be controlled on Anoro with no flare symptoms  #2 bronchiectasis-improved symptom management on vest therapy.  Patient to continue with mucolytic's flutter valve and vest therapy  #3 pulmonary MAC-she is tolerating aggressive triple antibiotic therapy.  She is continue with follow-up at the [facility].  And routine labs  Plan  Patient Instructions  Continue on ANORO 1 puff daily , rinse after use.  Flutter valve 2-3 times a day.  Continue on VEST Therapy Twice daily   Albuterol Nebs As needed   Continue Ethambutol, Rifampin and Azithromycin per ID recommendations.  Follow up with ID clinic as planned.  Mucinex daily As needed  Congestion .  Fluids and rest  Advance activity as tolerated.   Follow up with Dr. IValeta Harmsin 3 months and and As needed           Follow Up Instructions: Follow-up in 3 months and as needed    I discussed the assessment and treatment plan with the patient. The patient was provided  an opportunity to ask questions and all were answered. The patient agreed with the plan and demonstrated an understanding of the instructions.   The patient was advised to call back or seek an in-person  evaluation if the symptoms worsen or if the condition fails to improve as anticipated.  I provided 31  minutes of non-face-to-face time during this encounter.   Rexene Edison, NP

## 2019-06-12 NOTE — Patient Instructions (Signed)
Continue on ANORO 1 puff daily , rinse after use.  Flutter valve 2-3 times a day.  Continue on VEST Therapy Twice daily   Albuterol Nebs As needed   Continue Ethambutol, Rifampin and Azithromycin per ID recommendations.  Follow up with ID clinic as planned.  Mucinex daily As needed  Congestion .  Fluids and rest  Advance activity as tolerated.   Follow up with Dr. Valeta Harms in 3 months and and As needed

## 2019-06-12 NOTE — Telephone Encounter (Signed)
Received call from patient.   Reports that she has been taking Restoril for a dew months for insomnia.   Requested refill on Restoril to mail order. Also requested to change quantity to 90 day supply.   Ok to refill to mail order with increased quantity?  Last office visit 01/28/2019.  Last refill 04/26/2019, #1 refill.

## 2019-06-13 ENCOUNTER — Ambulatory Visit: Payer: Medicare Other | Admitting: Adult Health

## 2019-06-13 ENCOUNTER — Telehealth: Payer: Self-pay | Admitting: Adult Health

## 2019-06-13 NOTE — Telephone Encounter (Signed)
Dr. Valeta Harms,  This patient was last seen by you on 04/23/19.Looks like the original flutter valve was issued October 2020.  She saw you for: 1. Bronchiectasis with acute exacerbation ([place])  J47.1   2. Stage 2 moderate COPD by GOLD classification ([place])  J44.9   3. Pulmonary Mycobacterium avium complex (MAC) infection ([facility])  A31.0    Based on the notes for this patient, if we have a flutter valve available do you approve her getting one? The prescription for it would have to be signed also. Thank you much.

## 2019-06-13 NOTE — Telephone Encounter (Signed)
Yes please order new flutter valve.  Garner Nash, DO [facility] 06/13/2019 7:05 PM

## 2019-06-14 MED ORDER — FLUTTER DEVI: 0 refills | Status: DC

## 2019-06-14 NOTE — Telephone Encounter (Signed)
Flutter valve placed up front for pt.  Pt aware.  Nothing further needed at this time- will close encounter.

## 2019-06-14 NOTE — Progress Notes (Signed)
PCCM: thanks for seeing her Garner Nash, DO [facility] 06/14/2019 6:03 PM

## 2019-06-18 ENCOUNTER — Telehealth: Payer: Self-pay | Admitting: Pulmonary Disease

## 2019-06-18 MED ORDER — ALBUTEROL SULFATE HFA 108 (90 BASE) MCG/ACT IN AERS: 2.0000 | INHALATION_SPRAY | Freq: Four times a day (QID) | RESPIRATORY_TRACT | 0 refills | Status: DC | PRN

## 2019-06-18 NOTE — Telephone Encounter (Signed)
Spoke with pt and advised rx for albuterol was sent to [facility]. Nothing further is needed.

## 2019-06-24 ENCOUNTER — Ambulatory Visit: Payer: Medicare Other | Admitting: Adult Health

## 2019-07-12 ENCOUNTER — Telehealth: Payer: Self-pay

## 2019-07-12 NOTE — Telephone Encounter (Signed)
COVID-19 Pre-Screening Questions:07/12/19  Do you currently have a fever (>100 F), chills or unexplained body aches?NO   Are you currently experiencing new cough, shortness of breath, sore throat, runny nose?NO  .  Have you recently travelled outside the state of [place] in the last 14 days? NO  .  Have you been in contact with someone that is currently pending confirmation of Covid19 testing or has been confirmed to have the [place] virus? NO  **If the patient answers NO to ALL questions -  advise the patient to please call the clinic before coming to the office should any symptoms develop.

## 2019-07-15 ENCOUNTER — Encounter: Payer: Self-pay | Admitting: Infectious Disease

## 2019-07-15 ENCOUNTER — Ambulatory Visit (INDEPENDENT_AMBULATORY_CARE_PROVIDER_SITE_OTHER): Payer: Medicare Other | Admitting: Infectious Disease

## 2019-07-15 ENCOUNTER — Other Ambulatory Visit: Payer: Self-pay

## 2019-07-15 VITALS — BP 175/82 | HR 64 | Wt 134.6 lb

## 2019-07-15 DIAGNOSIS — K036 Deposits [accretions] on teeth: Secondary | ICD-10-CM

## 2019-07-15 DIAGNOSIS — J471 Bronchiectasis with (acute) exacerbation: Secondary | ICD-10-CM | POA: Diagnosis not present

## 2019-07-15 DIAGNOSIS — A310 Pulmonary mycobacterial infection: Secondary | ICD-10-CM | POA: Diagnosis present

## 2019-07-15 HISTORY — DX: Deposits (accretions) on teeth: K03.6

## 2019-07-15 LAB — COMPLETE METABOLIC PANEL WITH GFR
AG Ratio: 1.8 (calc) (ref 1.0–2.5)
ALT: 19 U/L (ref 6–29)
AST: 27 U/L (ref 10–35)
Albumin: 4.2 g/dL (ref 3.6–5.1)
Alkaline phosphatase (APISO): 77 U/L (ref 37–153)
BUN: 12 mg/dL (ref 7–25)
CO2: 28 mmol/L (ref 20–32)
Calcium: 9.5 mg/dL (ref 8.6–10.4)
Chloride: 106 mmol/L (ref 98–110)
Creat: 0.77 mg/dL (ref 0.50–0.99)
GFR, Est African American: 94 mL/min/{1.73_m2} (ref >=60)
GFR, Est Non African American: 81 mL/min/{1.73_m2} (ref >=60)
Globulin: 2.4 g/dL (calc) (ref 1.9–3.7)
Glucose, Bld: 77 mg/dL (ref 65–99)
Potassium: 4.4 mmol/L (ref 3.5–5.3)
Sodium: 141 mmol/L (ref 135–146)
Total Bilirubin: 0.4 mg/dL (ref 0.2–1.2)
Total Protein: 6.6 g/dL (ref 6.1–8.1)

## 2019-07-15 NOTE — Patient Instructions (Signed)
WE will try to procure Clofazamine to replace the rifampin in your 3 drug M avium treatment regimen  Continue current therapy until we have this drug for you  We will send you out to collect sputa for AFB and culture to return to our clinic

## 2019-07-15 NOTE — Progress Notes (Signed)
Subjective:   Chief complaint:    Patient ID: Katrina Campbell, female    DOB: July 04, 1955, 64 y.o.   MRN: [id number]  HPI  64 year old female, former smoker quit 2014. 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  She had been on a 3 times a week regimen  but had not had a great deal of improvement  We then changed her to azithromycin 500 mg daily rifampin 600 mg daily and ethambutol 900 mg daily.  Since changed to new regimen her coughing is improved dramatically.  She is actually having trouble getting up phlegm even using the vest provided by pulmonary.\  She has however had problems with staining of her teeth with red flecks throughout which the dental hygienist and dentist had a great deal of difficulty removing from her teeth.       Past Medical History:  Diagnosis Date  . Anxiety   . COPD (chronic obstructive pulmonary disease) ([place])   . Depression   . Hyperlipidemia   . Labile hypertension   . Pulmonary mycobacterial infection ([place]) 10/24/2018    Past Surgical History:  Procedure Laterality Date  . LEFT HEART CATH AND CORONARY ANGIOGRAPHY N/A 11/07/2017   Procedure: LEFT HEART CATH AND CORONARY ANGIOGRAPHY;  Surgeon: Nelva Bush, MD;  Location: [facility];  Service: Cardiovascular;  Laterality: N/A;  . NASAL SINUS SURGERY  1986  . VIDEO BRONCHOSCOPY Bilateral 01/29/2019   Procedure: VIDEO BRONCHOSCOPY WITH FLUORO;  Surgeon: Marshell Garfinkel, MD;  Location: [facility];  Service: Cardiopulmonary;  Laterality: Bilateral;    Family History  Problem Relation Age of Onset  . Heart attack Mother   . Emphysema Mother   . Congestive Heart Failure Mother   . Heart attack Father   . Congestive Heart Failure Father   . Asthma Other   . Lung disease Sister   . Lung disease Brother     . Colon cancer Neg Hx       Social History   Socioeconomic History  . Marital status: Married    Spouse name: Not on file  . Number of children: Not on file  . Years of education: Not on file  . Highest education level: Not on file  Occupational History  . Not on file  Tobacco Use  . Smoking status: Former Smoker    Packs/day: 1.00    Years: 40.00    Pack years: 40.00    Types: Cigarettes    Quit date: 11/09/2012    Years since quitting: 6.6  . Smokeless tobacco: Never Used  . Tobacco comment: vapor  Substance and Sexual Activity  . Alcohol use: Yes    Alcohol/week: 24.0 standard drinks    Types: 24 Cans of beer per week  . Drug use: No  . Sexual activity: Not Currently  Other Topics Concern  . Not on file  Social History Narrative  . Not on file   Social Determinants of Health   Financial Resource Strain:   . Difficulty of Paying Living Expenses:   Food Insecurity:   . Worried About Charity fundraiser in the Last Year:   . Arboriculturist in the Last Year:   Transportation Needs:   . Film/video editor (Medical):   Marland Kitchen Lack of Transportation (Non-Medical):   Physical Activity:   . Days of Exercise  per Week:   . Minutes of Exercise per Session:   Stress:   . Feeling of Stress :   Social Connections:   . Frequency of Communication with Friends and Family:   . Frequency of Social Gatherings with Friends and Family:   . Attends Religious Services:   . Active Member of Clubs or Organizations:   . Attends Archivist Meetings:   Marland Kitchen Marital Status:     No Known Allergies   Current Outpatient Medications:  .  albuterol (PROAIR HFA) 108 (90 Base) MCG/ACT inhaler, Inhale 2 puffs into the lungs every 6 (six) hours as needed for wheezing or shortness of breath., Disp: 24 g, Rfl: 0 .  albuterol (PROVENTIL) (2.5 MG/3ML) 0.083% nebulizer solution, Take 3 mLs (2.5 mg total) by nebulization every 6 (six) hours as needed for wheezing or shortness of breath.,  Disp: 225 mL, Rfl: 12 .  aspirin EC 81 MG tablet, Take 81 mg by mouth daily with lunch. , Disp: , Rfl:  .  azithromycin (ZITHROMAX) 500 MG tablet, Take 1 tablet (500 mg total) by mouth daily., Disp: 90 tablet, Rfl: 3 .  buPROPion (WELLBUTRIN XL) 300 MG 24 hr tablet, TAKE 1 TABLET DAILY, Disp: 90 tablet, Rfl: 3 .  Calcium Carb-Cholecalciferol (CALCIUM-VITAMIN D) 500-400 MG-UNIT TABS, 1 a day (Patient taking differently: Take 1 tablet by mouth daily with lunch. ), Disp: 60 tablet, Rfl:  .  ethambutol (MYAMBUTOL) 100 MG tablet, Take 1 tablet (100 mg total) by mouth daily. Take with 2 '400mg'$  tabs for '900mg'$  total dose, Disp: 90 tablet, Rfl: 3 .  ethambutol (MYAMBUTOL) 400 MG tablet, Take 2 tablets (800 mg total) by mouth daily. Take with '100mg'$  tablet of ethambutol for '900mg'$  total, Disp: 180 tablet, Rfl: 3 .  fluticasone (FLONASE) 50 MCG/ACT nasal spray, Place 2 sprays into both nostrils as needed for allergies or rhinitis., Disp: , Rfl:  .  furosemide (LASIX) 20 MG tablet, Take 1 tablet (20 mg total) by mouth daily as needed for edema (Swelling)., Disp: 30 tablet, Rfl: 6 .  guaiFENesin (MUCINEX) 600 MG 12 hr tablet, Take 600 mg by mouth 2 (two) times daily. , Disp: , Rfl:  .  Magnesium 500 MG TABS, Take 500 mg by mouth daily with lunch., Disp: , Rfl:  .  Respiratory Therapy Supplies (FLUTTER) DEVI, Use as directed, Disp: 1 each, Rfl: 0 .  rifampin (RIFADIN) 300 MG capsule, Take 2 capsules (600 mg total) by mouth daily., Disp: 180 capsule, Rfl: 3 .  rosuvastatin (CRESTOR) 10 MG tablet, TAKE 1 TABLET DAILY AT 6 P.M., Disp: 90 tablet, Rfl: 3 .  temazepam (RESTORIL) 15 MG capsule, Take 1 capsule (15 mg total) by mouth at bedtime as needed for sleep., Disp: 90 capsule, Rfl: 0 .  umeclidinium-vilanterol (ANORO ELLIPTA) 62.5-25 MCG/INH AEPB, Inhale 1 puff into the lungs daily., Disp: 180 each, Rfl: 3 .  vitamin C (ASCORBIC ACID) 500 MG tablet, Take 500 mg by mouth daily. , Disp: , Rfl:    Review of Systems    Constitutional: Negative for chills and fever.  HENT: Positive for dental problem. Negative for congestion and sore throat.   Eyes: Negative for photophobia.  Respiratory: Positive for shortness of breath. Negative for wheezing.   Cardiovascular: Negative for chest pain, palpitations and leg swelling.  Gastrointestinal: Negative for abdominal pain, blood in stool, constipation, diarrhea and vomiting.  Genitourinary: Negative for dysuria, flank pain and hematuria.  Musculoskeletal: Negative for back pain and myalgias.  Skin:  Negative for rash.  Neurological: Negative for dizziness, weakness and headaches.  Hematological: Does not bruise/bleed easily.  Psychiatric/Behavioral: Negative for suicidal ideas.       Objective:   Physical Exam Constitutional:      General: She is not in acute distress.    Appearance: She is not diaphoretic.  HENT:     Head: Normocephalic and atraumatic.     Right Ear: External ear normal.     Left Ear: External ear normal.     Nose: Nose normal.     Mouth/Throat:     Pharynx: No oropharyngeal exudate.  Eyes:     General: No scleral icterus.    Conjunctiva/sclera: Conjunctivae normal.     Pupils: Pupils are equal, round, and reactive to light.  Cardiovascular:     Rate and Rhythm: Normal rate and regular rhythm.     Heart sounds: Normal heart sounds. No murmur. No friction rub. No gallop.   Pulmonary:     Effort: Pulmonary effort is normal. Prolonged expiration present. No respiratory distress.     Breath sounds: Normal breath sounds. Decreased air movement present. No stridor. No wheezing.  Abdominal:     General: Bowel sounds are normal.     Palpations: Abdomen is soft.  Musculoskeletal:        General: No tenderness. Normal range of motion.     Cervical back: Normal range of motion and neck supple.  Lymphadenopathy:     Cervical: No cervical adenopathy.  Skin:    General: Skin is warm and dry.     Coloration: Skin is not pale.     Findings:  No erythema or rash.  Neurological:     General: No focal deficit present.     Mental Status: She is alert and oriented to person, place, and time.     Coordination: Coordination normal.  Psychiatric:        Attention and Perception: Attention and perception normal.        Mood and Affect: Mood is depressed.        Speech: Speech normal.        Behavior: Behavior normal.        Thought Content: Thought content normal.        Cognition and Memory: Cognition and memory normal.        Judgment: Judgment normal.    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  [name] is putting in application now once that is been done we will make that switch in the meantime she is to continue her current regimen.  We will check LFTs today.  We also send her out with a kit to collect AFB from her sputum if possible.  If this is negative or if she is unable to produce sputum will then count this is a negative smear.  We will try to treat her for a year + until get 2nd negative AFB or at least a  year with her when she states that her cough resolved provided the end of the ears flank by negative AFB result coming back  I will check liver function test today.  I  I will plan on seeing her back in 4 months time  Dental staining hopefully should resolve with  getting off the rifamycin

## 2019-07-16 ENCOUNTER — Telehealth: Payer: Self-pay | Admitting: Pharmacist

## 2019-07-16 ENCOUNTER — Encounter: Payer: Self-pay | Admitting: Infectious Disease

## 2019-07-16 ENCOUNTER — Other Ambulatory Visit: Payer: Medicare Other

## 2019-07-16 DIAGNOSIS — J471 Bronchiectasis with (acute) exacerbation: Secondary | ICD-10-CM

## 2019-07-16 DIAGNOSIS — A310 Pulmonary mycobacterial infection: Secondary | ICD-10-CM

## 2019-07-16 NOTE — Telephone Encounter (Signed)
Completed online application for clofazimine with [employer] . Will update Dr. Tommy Medal when approval status has been decided.

## 2019-07-16 NOTE — Addendum Note (Signed)
Addended by: Landis Gandy on: 07/16/2019 10:28 AM   Modules accepted: Orders

## 2019-07-29 NOTE — Telephone Encounter (Signed)
Patient is approved for clofazimine and it has arrived to clinic. Called patient to schedule an appt to come in for pick up and counseling.  She stated that she has changed her mind on stopping the rifampin and switching to clofazimine.  She has googled the clofazimine and is scared of the side effects. I went through the side effects with her such as photosensitivity, skin dryness, possible skin discoloration, and n/v/d. I told her that essentially none of my patients on clofazimine have had any side effects and tolerate it quite well.  She states that she goes to the beach often during the summer and is worried about the sun sensitivity. She wishes to continue on rifampin for now and will go to the dentist to get the discoloration scraped off if needed.  I told her I would let Dr. Tommy Medal know and keep her bottles in my drawer if she changes her mind. She said if Dr. Tommy Medal is adamant about starting the clofazimine to please call her.

## 2019-07-30 NOTE — Telephone Encounter (Signed)
Johnson if it were me I would probably go to clofazamine but this is also ok

## 2019-08-14 ENCOUNTER — Telehealth: Payer: Self-pay

## 2019-08-14 NOTE — Telephone Encounter (Signed)
MYCOBACTERIA, CULTURE, WITH FLUOROCHROME SMEAR  MICRO NUMBER: [id number] P   SPECIMEN QUALITY: Adequate P   Source: SPUTUM P   STATUS: PRELIMINARY P   SMEAR: No acid fast bacilli seen. P   ISOLATE 1: Acid-fast bacillusAbnormal  P   Comment: Acid-fast bacilli isolated. Identification is in progress.   [facility] called office with critical lab results. Will forward message to MD to he is aware. [place]

## 2019-08-15 NOTE — Telephone Encounter (Signed)
She has known Mycobacterium avium infection

## 2019-08-20 ENCOUNTER — Telehealth: Payer: Self-pay | Admitting: Cardiology

## 2019-08-20 NOTE — Telephone Encounter (Signed)
Pt c/o medication issue:  1. Name of Medication: Coq10 100 MG or 200 MG   2. How are you currently taking this medication (dosage and times per day)? Not currently taking medication  3. Are you having a reaction (difficulty breathing--STAT)? No   4. What is your medication issue? Aparna is calling wanting to know if it would be okay for her to start taking Coq10 either 100 MG or 200 MG with the other medications she is on for her heart. Please advise.

## 2019-08-20 NOTE — Telephone Encounter (Signed)
That is perfectly fine with me.

## 2019-08-20 NOTE — Telephone Encounter (Signed)
Per DPR it is OK to leave detailed message on voicemail.  Message left that it is fine with Dr Meda Coffee to take these medications. Left message to call if questions.

## 2019-09-17 ENCOUNTER — Ambulatory Visit: Payer: Medicare Other | Admitting: Adult Health

## 2019-09-17 ENCOUNTER — Other Ambulatory Visit: Payer: Self-pay | Admitting: *Deleted

## 2019-09-17 MED ORDER — TEMAZEPAM 15 MG PO CAPS: 15.0000 mg | ORAL_CAPSULE | Freq: Every evening | ORAL | 0 refills | Status: DC | PRN

## 2019-09-17 NOTE — Telephone Encounter (Signed)
Received fax requesting refill on Temazepam to mail order.   Ok to refill??  Last office visit 01/28/2019.  Last refill 06/12/2019 to [facility].

## 2019-09-22 LAB — MYCOBACTERIA,CULT W/FLUOROCHROME SMEAR
MICRO NUMBER:: 10362246
SMEAR:: NONE SEEN
SPECIMEN QUALITY:: ADEQUATE

## 2019-09-22 LAB — M. AVIUM MIC PANEL
AMIKACIN: 32
CIPROFLOXACIN: >16
CLARITHROMYCIN: 4
ETHAMBUTOL: 8
ETHIONAMIDE: 2.5
ISONIAZID: 4
LINEZOLID: 64
MOXIFLOXACIN: 8
RIFABUTIN: 0.5
RIFAMPIN: >8
STREPTOMYCIN: 64

## 2019-09-25 ENCOUNTER — Telehealth: Payer: Self-pay

## 2019-09-25 NOTE — Telephone Encounter (Signed)
Patient calling inquiring about her lab results. Explained to patient that her sputum culture still presents with mycobacterium and to continue to take her 3 regimen antibiotics as prescribed.  All questions/concerns answered.  Eugenia Mcalpine

## 2019-10-01 ENCOUNTER — Ambulatory Visit: Payer: Medicare Other | Admitting: Adult Health

## 2019-10-17 ENCOUNTER — Other Ambulatory Visit: Payer: Self-pay

## 2019-10-17 ENCOUNTER — Other Ambulatory Visit: Payer: Medicare Other

## 2019-10-17 DIAGNOSIS — R7989 Other specified abnormal findings of blood chemistry: Secondary | ICD-10-CM

## 2019-10-17 DIAGNOSIS — R799 Abnormal finding of blood chemistry, unspecified: Secondary | ICD-10-CM

## 2019-10-18 LAB — TSH: TSH: 3.42 mIU/L (ref 0.40–4.50)

## 2019-11-06 ENCOUNTER — Encounter: Payer: Self-pay | Admitting: Pulmonary Disease

## 2019-11-06 ENCOUNTER — Other Ambulatory Visit: Payer: Self-pay

## 2019-11-06 ENCOUNTER — Ambulatory Visit (INDEPENDENT_AMBULATORY_CARE_PROVIDER_SITE_OTHER): Payer: Medicare Other | Admitting: Pulmonary Disease

## 2019-11-06 VITALS — BP 112/62 | HR 60 | Temp 98.0°F | Ht 64.0 in | Wt 132.4 lb

## 2019-11-06 DIAGNOSIS — A310 Pulmonary mycobacterial infection: Secondary | ICD-10-CM

## 2019-11-06 DIAGNOSIS — R911 Solitary pulmonary nodule: Secondary | ICD-10-CM

## 2019-11-06 DIAGNOSIS — J479 Bronchiectasis, uncomplicated: Secondary | ICD-10-CM | POA: Diagnosis not present

## 2019-11-06 DIAGNOSIS — J449 Chronic obstructive pulmonary disease, unspecified: Secondary | ICD-10-CM | POA: Diagnosis not present

## 2019-11-06 DIAGNOSIS — Z87891 Personal history of nicotine dependence: Secondary | ICD-10-CM

## 2019-11-06 NOTE — Patient Instructions (Addendum)
Thank you for visiting Dr. Valeta Harms at [facility]. Today we recommend the following:  Orders Placed This Encounter  Procedures  . Ambulatory Referral for Lung Cancer Scre   Please schedule appt with Eric Form for LDCT Screening appt next available.   Return in about 4 months (around 03/07/2020) for with APP or Dr. Valeta Harms.    Please do your part to reduce the spread of COVID-19.

## 2019-11-06 NOTE — Progress Notes (Signed)
Synopsis: Referred in January 2021 for former smoker quit 2014 moderate COPD, MAI by Alycia Rossetti, MD  Subjective:   PATIENT ID: Katrina Campbell GENDER: female DOB: 08-20-1955, MRN: [id number]  Chief Complaint  Patient presents with  . Follow-up    Coughing with blood (streaky) in sputum    This is a 64 year old female moderate COPD, former smoker quit 2014, MAI patient last seen in the office by Dr. Vaughan Browner.  Former Dr. Lenna Gilford patient.  Treated for stenotrophomonas in the past.  Prior 3 sputum's AFB, 1/3+ for MAI.  Saw infectious disease to discuss initiation of therapy.  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  Patient was last seen by infectious disease on 04/15/2019.  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  Just started on azithromycin 500 mg daily, rifampin 600 mg daily plus ethambutol 900 mg daily.  Patient does have CT imaging with lower lobe bronchiectasis.  Patient has chronic sputum production.  She is short of breath with daily cough and sputum production.  During this time she is very anxious and hesitant about going out in public due to her chronic cough.  She denies hemoptysis patient's weight has also been stable.  OV 11/06/2019: around the 4th of July patient had congestion and cough that still hasnt really gone away.  Overall doing much better now.  Has less cough and congestion.  Recent follow-up with infectious disease plan for Monday.  Been compliant with her medications.  Still has some daily sputum production.  Rarely has a streak of blood with cough.     Past Medical History:  Diagnosis Date  . Anxiety   . COPD (chronic obstructive pulmonary disease) ([place])   . Dental staining 07/15/2019  . Depression   . Hyperlipidemia   . Labile hypertension   . Pulmonary mycobacterial infection ([place]) 10/24/2018     Family  History  Problem Relation Age of Onset  . Heart attack Mother   . Emphysema Mother   . Congestive Heart Failure Mother   . Heart attack Father   . Congestive Heart Failure Father   . Asthma Other   . Lung disease Sister   . Lung disease Brother   . Colon cancer Neg Hx      Past Surgical History:  Procedure Laterality Date  . LEFT HEART CATH AND CORONARY ANGIOGRAPHY N/A 11/07/2017   Procedure: LEFT HEART CATH AND CORONARY ANGIOGRAPHY;  Surgeon: Nelva Bush, MD;  Location: [facility];  Service: Cardiovascular;  Laterality: N/A;  . NASAL SINUS SURGERY  1986  . VIDEO BRONCHOSCOPY Bilateral 01/29/2019   Procedure: VIDEO BRONCHOSCOPY WITH FLUORO;  Surgeon: Marshell Garfinkel, MD;  Location: [facility];  Service: Cardiopulmonary;  Laterality: Bilateral;    Social History   Socioeconomic History  . Marital status: Married    Spouse name: Not on file  . Number of children: Not on file  . Years of education: Not on file  . Highest education level: Not on file  Occupational History  . Not on file  Tobacco Use  . Smoking status: Former Smoker    Packs/day: 1.00    Years: 40.00    Pack years: 40.00    Types: Cigarettes    Quit date: 11/09/2012    Years since quitting: 6.9  . Smokeless tobacco: Never Used  . Tobacco comment: vapor  Vaping Use  . Vaping Use: Former  Substance and Sexual Activity  . Alcohol use: Yes    Alcohol/week: 24.0 standard drinks    Types: 24 Cans of beer per week  . Drug use: No  . Sexual activity: Not Currently  Other Topics Concern  . Not on file  Social History Narrative  . Not on file   Social Determinants of Health   Financial Resource Strain:   . Difficulty of Paying Living Expenses:   Food Insecurity:   . Worried About Charity fundraiser in the Last Year:   . Arboriculturist in the Last Year:   Transportation Needs:   . Film/video editor (Medical):   Marland Kitchen Lack of Transportation (Non-Medical):   Physical Activity:   . Days of  Exercise per Week:   . Minutes of Exercise per Session:   Stress:   . Feeling of Stress :   Social Connections:   . Frequency of Communication with Friends and Family:   . Frequency of Social Gatherings with Friends and Family:   . Attends Religious Services:   . Active Member of Clubs or Organizations:   . Attends Archivist Meetings:   Marland Kitchen Marital Status:   Intimate Partner Violence:   . Fear of Current or Ex-Partner:   . Emotionally Abused:   Marland Kitchen Physically Abused:   . Sexually Abused:      No Known Allergies   Outpatient Medications Prior to Visit  Medication Sig Dispense Refill  . albuterol (PROAIR HFA) 108 (90 Base) MCG/ACT inhaler Inhale 2 puffs into the lungs every 6 (six) hours as needed for wheezing or shortness of breath. 24 g 0  . albuterol (PROVENTIL) (2.5 MG/3ML) 0.083% nebulizer solution Take 3 mLs (2.5 mg total) by nebulization every 6 (six) hours as needed for wheezing or shortness of breath. 225 mL 12  . aspirin EC 81 MG tablet Take 81 mg by mouth daily with lunch.     Marland Kitchen azithromycin (ZITHROMAX) 500 MG tablet Take 1 tablet (500 mg total) by mouth daily. 90 tablet 3  . buPROPion (WELLBUTRIN XL) 300 MG 24 hr tablet TAKE 1 TABLET DAILY 90 tablet 3  . Calcium Carb-Cholecalciferol (CALCIUM-VITAMIN D) 500-400 MG-UNIT TABS 1 a day (Patient taking differently: Take 1 tablet by mouth daily with lunch. ) 60 tablet   . ethambutol (MYAMBUTOL) 100 MG tablet Take 1 tablet (100 mg total) by mouth daily. Take with 2 472m tabs for 9063mtotal dose 90 tablet 3  . ethambutol (MYAMBUTOL) 400 MG tablet Take 2 tablets (800 mg total) by mouth daily. Take with 10066mablet of ethambutol for 900m31mtal 180 tablet 3  . furosemide (LASIX) 20 MG tablet Take 1 tablet (20 mg total) by mouth daily as needed for edema (Swelling). 30 tablet 6  . guaiFENesin (MUCINEX) 600 MG 12 hr tablet Take 600 mg by mouth 2 (two) times daily.     . Magnesium 500 MG TABS Take 500 mg by mouth daily with  lunch.    . Respiratory Therapy Supplies (FLUTTER) DEVI Use as directed 1 each 0  . rifampin (RIFADIN) 300 MG capsule Take 2 capsules (600 mg total) by mouth daily. 180 capsule 3  . rosuvastatin (CRESTOR) 10 MG tablet TAKE 1 TABLET DAILY AT 6 P.M. 90 tablet 3  . temazepam (RESTORIL) 15 MG capsule Take 1 capsule (15 mg total) by mouth at bedtime as needed for sleep. 90 capsule 0  . umeclidinium-vilanterol (ANORO ELLIPTA) 62.5-25 MCG/INH AEPB Inhale 1 puff into the  lungs daily. 180 each 3  . vitamin C (ASCORBIC ACID) 500 MG tablet Take 500 mg by mouth daily.     . fluticasone (FLONASE) 50 MCG/ACT nasal spray Place 2 sprays into both nostrils as needed for allergies or rhinitis. (Patient not taking: Reported on 11/06/2019)     No facility-administered medications prior to visit.    Review of Systems  Constitutional: Negative for chills, fever, malaise/fatigue and weight loss.  HENT: Negative for hearing loss, sore throat and tinnitus.   Eyes: Negative for blurred vision and double vision.  Respiratory: Positive for cough, hemoptysis, sputum production and shortness of breath. Negative for wheezing and stridor.   Cardiovascular: Negative for chest pain, palpitations, orthopnea, leg swelling and PND.  Gastrointestinal: Negative for abdominal pain, constipation, diarrhea, heartburn, nausea and vomiting.  Genitourinary: Negative for dysuria, hematuria and urgency.  Musculoskeletal: Negative for joint pain and myalgias.  Skin: Negative for itching and rash.  Neurological: Negative for dizziness, tingling, weakness and headaches.  Endo/Heme/Allergies: Negative for environmental allergies. Does not bruise/bleed easily.  Psychiatric/Behavioral: Negative for depression. The patient is not nervous/anxious and does not have insomnia.   All other systems reviewed and are negative.    Objective:  Physical Exam Vitals reviewed.  Constitutional:      General: She is not in acute distress.     Appearance: She is well-developed.  HENT:     Head: Normocephalic and atraumatic.  Eyes:     General: No scleral icterus.    Conjunctiva/sclera: Conjunctivae normal.     Pupils: Pupils are equal, round, and reactive to light.  Neck:     Vascular: No JVD.     Trachea: No tracheal deviation.  Cardiovascular:     Rate and Rhythm: Normal rate and regular rhythm.     Heart sounds: Normal heart sounds. No murmur heard.   Pulmonary:     Effort: Pulmonary effort is normal. No tachypnea, accessory muscle usage or respiratory distress.     Breath sounds: No stridor. Rhonchi present. No wheezing or rales.  Abdominal:     General: Bowel sounds are normal. There is no distension.     Palpations: Abdomen is soft.     Tenderness: There is no abdominal tenderness.  Musculoskeletal:        General: No tenderness.     Cervical back: Neck supple.  Lymphadenopathy:     Cervical: No cervical adenopathy.  Skin:    General: Skin is warm and dry.     Capillary Refill: Capillary refill takes less than 2 seconds.     Findings: No rash.  Neurological:     Mental Status: She is alert and oriented to person, place, and time.  Psychiatric:        Behavior: Behavior normal.      Vitals:   11/06/19 1325  BP: 112/62  Pulse: 60  Temp: 98 F (36.7 C)  TempSrc: Oral  SpO2: 96%  Weight: 132 lb 6.4 oz (60.1 kg)  Height: _0  (1.626 m)   96% on RA BMI Readings from Last 3 Encounters:  11/06/19 22.73 kg/m  07/15/19 23.10 kg/m  04/17/19 23.58 kg/m   Wt Readings from Last 3 Encounters:  11/06/19 132 lb 6.4 oz (60.1 kg)  07/15/19 134 lb 9.6 oz (61.1 kg)  04/17/19 137 lb 6.4 oz (62.3 kg)     CBC    Component Value Date/Time   WBC 6.0 01/02/2019 1005   WBC 5.4 05/29/2018 1232   RBC 4.70 01/02/2019 1005  RBC 4.78 05/29/2018 1232   HGB 14.5 01/02/2019 1005   HCT 41.9 01/02/2019 1005   PLT 154 01/02/2019 1005   MCV 89 01/02/2019 1005   MCH 30.9 01/02/2019 1005   MCH 29.1 05/29/2018  1232   MCHC 34.6 01/02/2019 1005   MCHC 31.7 05/29/2018 1232   RDW 12.2 01/02/2019 1005   LYMPHSABS 2.1 01/02/2019 1005   MONOABS 0.5 11/06/2017 2214   EOSABS 0.1 01/02/2019 1005   BASOSABS 0.0 01/02/2019 1005    Chest Imaging:  May 2020 CT chest: Scattered nodules.  Lower lobe small areas of bronchiectasis. The patient's images have been independently reviewed by me.    Pulmonary Functions Testing Results: No flowsheet data found.  FeNO: none   Pathology: none   Echocardiogram: none  Heart Catheterization: none     Assessment & Plan:     ICD-10-CM   1. Pulmonary Mycobacterium avium complex (MAC) infection ([place])  A31.0   2. Bronchiectasis without complication ([facility])  L87.5   3. Stage 2 moderate COPD by GOLD classification ([place])  J44.9   4. MAI (mycobacterium avium-intracellulare) ([facility])  A31.0   5. Lung nodule  R91.1 Ambulatory Referral for Lung Cancer Scre  6. Former smoker  Z87.891 Ambulatory Referral for Lung Cancer Scre    Discussion:  64 year old female, moderate COPD FEV1 66% predicted, longstanding history of smoking with evidence of emphysema managed on LAMA/LABA.  Now off ICS due to MAC.  Antibiotic triple therapy regimen for MAC treated by infectious disease.  Plan: Continue management of abx by ID  Continue anoro ellipta  Continue flutter and vest therapy  Let us know if you have anymore episodes of hemoptysis  Continue to exercise as much as you can, stay active  Referral to lung cancer screening program, 40+ pack year history, quit in 2014  Greater than 50% of this patient's 25-minute of visit was in face-to-face discussing the recommendations treatment plan.   Current Outpatient Medications:  .  albuterol (PROAIR HFA) 108 (90 Base) MCG/ACT inhaler, Inhale 2 puffs into the lungs every 6 (six) hours as needed for wheezing or shortness of breath., Disp: 24 g, Rfl: 0 .  albuterol (PROVENTIL) (2.5 MG/3ML) 0.083% nebulizer solution, Take 3 mLs (2.5 mg  total) by nebulization every 6 (six) hours as needed for wheezing or shortness of breath., Disp: 225 mL, Rfl: 12 .  aspirin EC 81 MG tablet, Take 81 mg by mouth daily with lunch. , Disp: , Rfl:  .  azithromycin (ZITHROMAX) 500 MG tablet, Take 1 tablet (500 mg total) by mouth daily., Disp: 90 tablet, Rfl: 3 .  buPROPion (WELLBUTRIN XL) 300 MG 24 hr tablet, TAKE 1 TABLET DAILY, Disp: 90 tablet, Rfl: 3 .  Calcium Carb-Cholecalciferol (CALCIUM-VITAMIN D) 500-400 MG-UNIT TABS, 1 a day (Patient taking differently: Take 1 tablet by mouth daily with lunch. ), Disp: 60 tablet, Rfl:  .  ethambutol (MYAMBUTOL) 100 MG tablet, Take 1 tablet (100 mg total) by mouth daily. Take with 2 450m tabs for 9050mtotal dose, Disp: 90 tablet, Rfl: 3 .  ethambutol (MYAMBUTOL) 400 MG tablet, Take 2 tablets (800 mg total) by mouth daily. Take with 10057mablet of ethambutol for 900m76mtal, Disp: 180 tablet, Rfl: 3 .  furosemide (LASIX) 20 MG tablet, Take 1 tablet (20 mg total) by mouth daily as needed for edema (Swelling)., Disp: 30 tablet, Rfl: 6 .  guaiFENesin (MUCINEX) 600 MG 12 hr tablet, Take 600 mg by mouth 2 (two) times daily. , Disp: ,  Rfl:  .  Magnesium 500 MG TABS, Take 500 mg by mouth daily with lunch., Disp: , Rfl:  .  Respiratory Therapy Supplies (FLUTTER) DEVI, Use as directed, Disp: 1 each, Rfl: 0 .  rifampin (RIFADIN) 300 MG capsule, Take 2 capsules (600 mg total) by mouth daily., Disp: 180 capsule, Rfl: 3 .  rosuvastatin (CRESTOR) 10 MG tablet, TAKE 1 TABLET DAILY AT 6 P.M., Disp: 90 tablet, Rfl: 3 .  temazepam (RESTORIL) 15 MG capsule, Take 1 capsule (15 mg total) by mouth at bedtime as needed for sleep., Disp: 90 capsule, Rfl: 0 .  umeclidinium-vilanterol (ANORO ELLIPTA) 62.5-25 MCG/INH AEPB, Inhale 1 puff into the lungs daily., Disp: 180 each, Rfl: 3 .  vitamin C (ASCORBIC ACID) 500 MG tablet, Take 500 mg by mouth daily. , Disp: , Rfl:  .  fluticasone (FLONASE) 50 MCG/ACT nasal spray, Place 2 sprays into  both nostrils as needed for allergies or rhinitis. (Patient not taking: Reported on 11/06/2019), Disp: , Rfl:    Garner Nash, DO [facility] 11/06/2019 1:37 PM

## 2019-11-11 ENCOUNTER — Ambulatory Visit (INDEPENDENT_AMBULATORY_CARE_PROVIDER_SITE_OTHER): Payer: Medicare Other | Admitting: Infectious Disease

## 2019-11-11 ENCOUNTER — Other Ambulatory Visit: Payer: Self-pay

## 2019-11-11 ENCOUNTER — Telehealth: Payer: Self-pay | Admitting: Pharmacist

## 2019-11-11 ENCOUNTER — Encounter: Payer: Self-pay | Admitting: Infectious Disease

## 2019-11-11 VITALS — BP 144/79 | HR 68 | Temp 98.2°F | Wt 131.0 lb

## 2019-11-11 DIAGNOSIS — J42 Unspecified chronic bronchitis: Secondary | ICD-10-CM | POA: Diagnosis not present

## 2019-11-11 DIAGNOSIS — R918 Other nonspecific abnormal finding of lung field: Secondary | ICD-10-CM | POA: Diagnosis not present

## 2019-11-11 DIAGNOSIS — J471 Bronchiectasis with (acute) exacerbation: Secondary | ICD-10-CM

## 2019-11-11 DIAGNOSIS — A310 Pulmonary mycobacterial infection: Secondary | ICD-10-CM | POA: Diagnosis present

## 2019-11-11 LAB — COMPLETE METABOLIC PANEL WITH GFR
AG Ratio: 2.3 (calc) (ref 1.0–2.5)
ALT: 27 U/L (ref 6–29)
AST: 30 U/L (ref 10–35)
Albumin: 4.4 g/dL (ref 3.6–5.1)
Alkaline phosphatase (APISO): 73 U/L (ref 37–153)
BUN: 13 mg/dL (ref 7–25)
CO2: 27 mmol/L (ref 20–32)
Calcium: 9 mg/dL (ref 8.6–10.4)
Chloride: 106 mmol/L (ref 98–110)
Creat: 0.84 mg/dL (ref 0.50–0.99)
GFR, Est African American: 84 mL/min/{1.73_m2} (ref >=60)
GFR, Est Non African American: 72 mL/min/{1.73_m2} (ref >=60)
Globulin: 1.9 g/dL (calc) (ref 1.9–3.7)
Glucose, Bld: 93 mg/dL (ref 65–99)
Potassium: 4.3 mmol/L (ref 3.5–5.3)
Sodium: 140 mmol/L (ref 135–146)
Total Bilirubin: 0.6 mg/dL (ref 0.2–1.2)
Total Protein: 6.3 g/dL (ref 6.1–8.1)

## 2019-11-11 MED ORDER — AMBULATORY NON FORMULARY MEDICATION: 100.0000 mg | Freq: Every day | 11 refills | Status: DC

## 2019-11-11 NOTE — Telephone Encounter (Signed)
Gave patient 2 bottles of clofazimine. This will last her ~3 months.

## 2019-11-11 NOTE — Progress Notes (Signed)
Subjective:   Chief complaint: She is frustrated with her infection and with the fact that there may be " no cure"    Patient ID: Katrina Campbell, female    DOB: 26-Jan-1956, 64 y.o.   MRN: [id number]  HPI  64 year old female, former smoker quit 2014. 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  She had been on a 3 times a week regimen  but had not had a great deal of improvement  We then changed her to azithromycin 500 mg daily rifampin 600 mg daily and ethambutol 900 mg daily.  Since changed to new regimen her coughing improved dramatically.  She is actually having trouble getting up phlegm even using the vest provided by pulmonary.  She has however had problems with staining of her teeth with red flecks throughout which the dental hygienist and dentist had a great deal of difficulty removing from her teeth.  [name] endeavored to procure Clofazamine the idea of swapping this drug in for her rifamycin.  However she initially did not want to do this.  We did send off AFB sputum for culture and unfortunately did recover Mycobacterium AVM yet again.  Her weight appears stable.  She says around July 4 she "must of caught some bug" and had a sore throat for a few days with worsening of her cough which is continued since then though not back to prior levels.       Past Medical History:  Diagnosis Date  . Anxiety   . COPD (chronic obstructive pulmonary disease) ([place])   . Dental staining 07/15/2019  . Depression   . Hyperlipidemia   . Labile hypertension   . Pulmonary mycobacterial infection ([place]) 10/24/2018    Past Surgical History:  Procedure Laterality Date  . LEFT HEART CATH AND CORONARY ANGIOGRAPHY N/A 11/07/2017   Procedure: LEFT HEART CATH AND CORONARY ANGIOGRAPHY;  Surgeon: Nelva Bush, MD;   Location: [facility];  Service: Cardiovascular;  Laterality: N/A;  . NASAL SINUS SURGERY  1986  . VIDEO BRONCHOSCOPY Bilateral 01/29/2019   Procedure: VIDEO BRONCHOSCOPY WITH FLUORO;  Surgeon: Marshell Garfinkel, MD;  Location: [facility];  Service: Cardiopulmonary;  Laterality: Bilateral;    Family History  Problem Relation Age of Onset  . Heart attack Mother   . Emphysema Mother   . Congestive Heart Failure Mother   . Heart attack Father   . Congestive Heart Failure Father   . Asthma Other   . Lung disease Sister   . Lung disease Brother   . Colon cancer Neg Hx       Social History   Socioeconomic History  . Marital status: Married    Spouse name: Not on file  . Number of children: Not on file  . Years of education: Not on file  . Highest education level: Not on file  Occupational History  . Not on file  Tobacco Use  . Smoking status: Former Smoker    Packs/day: 1.00    Years: 40.00    Pack years: 40.00    Types: Cigarettes    Quit date: 11/09/2012    Years since quitting: 7.0  . Smokeless tobacco: Never Used  . Tobacco comment: vapor  Vaping Use  . Vaping Use: Former  Substance and Sexual Activity  . Alcohol use: Yes    Alcohol/week: 24.0 standard drinks  Types: 24 Cans of beer per week  . Drug use: No  . Sexual activity: Not Currently  Other Topics Concern  . Not on file  Social History Narrative  . Not on file   Social Determinants of Health   Financial Resource Strain:   . Difficulty of Paying Living Expenses:   Food Insecurity:   . Worried About Charity fundraiser in the Last Year:   . Arboriculturist in the Last Year:   Transportation Needs:   . Film/video editor (Medical):   Marland Kitchen Lack of Transportation (Non-Medical):   Physical Activity:   . Days of Exercise per Week:   . Minutes of Exercise per Session:   Stress:   . Feeling of Stress :   Social Connections:   . Frequency of Communication with Friends and Family:   . Frequency  of Social Gatherings with Friends and Family:   . Attends Religious Services:   . Active Member of Clubs or Organizations:   . Attends Archivist Meetings:   Marland Kitchen Marital Status:     No Known Allergies   Current Outpatient Medications:  .  albuterol (PROAIR HFA) 108 (90 Base) MCG/ACT inhaler, Inhale 2 puffs into the lungs every 6 (six) hours as needed for wheezing or shortness of breath., Disp: 24 g, Rfl: 0 .  albuterol (PROVENTIL) (2.5 MG/3ML) 0.083% nebulizer solution, Take 3 mLs (2.5 mg total) by nebulization every 6 (six) hours as needed for wheezing or shortness of breath., Disp: 225 mL, Rfl: 12 .  aspirin EC 81 MG tablet, Take 81 mg by mouth daily with lunch. , Disp: , Rfl:  .  azithromycin (ZITHROMAX) 500 MG tablet, Take 1 tablet (500 mg total) by mouth daily., Disp: 90 tablet, Rfl: 3 .  buPROPion (WELLBUTRIN XL) 300 MG 24 hr tablet, TAKE 1 TABLET DAILY, Disp: 90 tablet, Rfl: 3 .  Calcium Carb-Cholecalciferol (CALCIUM-VITAMIN D) 500-400 MG-UNIT TABS, 1 a day (Patient taking differently: Take 1 tablet by mouth daily with lunch. ), Disp: 60 tablet, Rfl:  .  ethambutol (MYAMBUTOL) 100 MG tablet, Take 1 tablet (100 mg total) by mouth daily. Take with 2 462m tabs for 9057mtotal dose, Disp: 90 tablet, Rfl: 3 .  ethambutol (MYAMBUTOL) 400 MG tablet, Take 2 tablets (800 mg total) by mouth daily. Take with 10031mablet of ethambutol for 900m1mtal, Disp: 180 tablet, Rfl: 3 .  fluticasone (FLONASE) 50 MCG/ACT nasal spray, Place 2 sprays into both nostrils as needed for allergies or rhinitis. (Patient not taking: Reported on 11/06/2019), Disp: , Rfl:  .  furosemide (LASIX) 20 MG tablet, Take 1 tablet (20 mg total) by mouth daily as needed for edema (Swelling)., Disp: 30 tablet, Rfl: 6 .  guaiFENesin (MUCINEX) 600 MG 12 hr tablet, Take 600 mg by mouth 2 (two) times daily. , Disp: , Rfl:  .  Magnesium 500 MG TABS, Take 500 mg by mouth daily with lunch., Disp: , Rfl:  .  Respiratory Therapy  Supplies (FLUTTER) DEVI, Use as directed, Disp: 1 each, Rfl: 0 .  rifampin (RIFADIN) 300 MG capsule, Take 2 capsules (600 mg total) by mouth daily., Disp: 180 capsule, Rfl: 3 .  rosuvastatin (CRESTOR) 10 MG tablet, TAKE 1 TABLET DAILY AT 6 P.M., Disp: 90 tablet, Rfl: 3 .  temazepam (RESTORIL) 15 MG capsule, Take 1 capsule (15 mg total) by mouth at bedtime as needed for sleep., Disp: 90 capsule, Rfl: 0 .  umeclidinium-vilanterol (ANORO ELLIPTA) 62.5-25 MCG/INH  AEPB, Inhale 1 puff into the lungs daily., Disp: 180 each, Rfl: 3 .  vitamin C (ASCORBIC ACID) 500 MG tablet, Take 500 mg by mouth daily. , Disp: , Rfl:    Review of Systems  Constitutional: Negative for chills and fever.  HENT: Positive for dental problem. Negative for congestion and sore throat.   Eyes: Negative for photophobia.  Respiratory: Positive for cough and shortness of breath. Negative for wheezing.   Cardiovascular: Negative for chest pain, palpitations and leg swelling.  Gastrointestinal: Negative for abdominal pain, blood in stool, constipation, diarrhea and vomiting.  Genitourinary: Negative for dysuria, flank pain and hematuria.  Musculoskeletal: Negative for back pain and myalgias.  Skin: Negative for rash.  Neurological: Negative for dizziness, weakness and headaches.  Hematological: Does not bruise/bleed easily.  Psychiatric/Behavioral: Negative for suicidal ideas.       Objective:   Physical Exam Constitutional:      General: She is not in acute distress.    Appearance: She is not diaphoretic.  HENT:     Head: Normocephalic and atraumatic.     Right Ear: External ear normal.     Left Ear: External ear normal.     Nose: Nose normal.     Mouth/Throat:     Pharynx: No oropharyngeal exudate.  Eyes:     General: No scleral icterus.    Conjunctiva/sclera: Conjunctivae normal.     Pupils: Pupils are equal, round, and reactive to light.  Cardiovascular:     Rate and Rhythm: Normal rate and regular rhythm.      Heart sounds: Normal heart sounds. No murmur heard.  No friction rub. No gallop.   Pulmonary:     Effort: Pulmonary effort is normal. Prolonged expiration present. No respiratory distress.     Breath sounds: Normal breath sounds. Decreased air movement present. No stridor. No wheezing.  Abdominal:     General: Bowel sounds are normal.     Palpations: Abdomen is soft.  Musculoskeletal:        General: No tenderness. Normal range of motion.     Cervical back: Normal range of motion and neck supple.  Lymphadenopathy:     Cervical: No cervical adenopathy.  Skin:    General: Skin is warm and dry.     Coloration: Skin is not pale.     Findings: No erythema or rash.  Neurological:     General: No focal deficit present.     Mental Status: She is alert and oriented to person, place, and time.     Coordination: Coordination normal.  Psychiatric:        Attention and Perception: Attention and perception normal.        Mood and Affect: Mood is depressed. Affect is tearful.        Speech: Speech normal.        Behavior: Behavior normal.        Thought Content: Thought content normal.        Cognition and Memory: Cognition and memory normal.        Judgment: Judgment normal.    Oropharynx with some staining of her teeth seen below July 15, 2019:           Assessment & Plan:   Mycobacterium avium infection with nodular lung disease in pt with COPD   Continue azithromycin ethambutol along with clofazimine and discontinuing rifamycin  Check LFTs today  Send off AFB sputum kit for her to collect sent for culture for [place].  URI  hx: could have been COVID or other resp virus but she has recovered. She was vaccinated

## 2019-11-11 NOTE — Progress Notes (Signed)
Met with patient today and gave her 2 bottles of clofazimine for around ~3 month supply.   Counseled patient to take TWO clofazimine capsules together once daily. Advised patient not to separate capsules and to make sure to take them together. Explained that the tablets are 50 mg each but the dose needed is 100 mg, therefore the need to take two capsules together. Encouraged patient not to miss any doses and to continue taking until discontinued by Dr. Tommy Medal.  Counseled patient on what to do if dose is missed - if it is closer to the missed dose take immediately; if closer to next dose skip dose and take the next dose at the usual time.   Counseled patient that side effects are usually observed on higher doses but that common side effects include GI upset with nausea and diarrhea. Taking clofazimine with food may help the upset stomach, and it should also be taken with a full glass of water.  Also counseled patient that medication can darken skin and other secretions such as tears, saliva, and urine. Advised patient to avoid direct sun exposure while on clofazimine as the medication can increase sun sensitivity.  Asked that the patient wear sunscreen, a hat, and long sleeves while outside.  Other common side effects include skin dryness and liver toxicity but advised patient that we will be monitoring her liver throughout therapy. All side effects tend to resolve after discontinuation of clofazimine. Answered all questions and patient will call me if any issues arise.  Patient is going to the beach for 2 weeks and is worried about the increased sun sensitivity. She will continue rifampin until she returns from the beach and then start the clofazimine.

## 2019-11-14 ENCOUNTER — Other Ambulatory Visit: Payer: Self-pay | Admitting: *Deleted

## 2019-11-14 DIAGNOSIS — Z87891 Personal history of nicotine dependence: Secondary | ICD-10-CM

## 2019-11-26 ENCOUNTER — Other Ambulatory Visit: Payer: Self-pay

## 2019-11-26 ENCOUNTER — Other Ambulatory Visit: Payer: Medicare Other

## 2019-11-26 DIAGNOSIS — A310 Pulmonary mycobacterial infection: Secondary | ICD-10-CM

## 2019-12-10 ENCOUNTER — Telehealth: Payer: Self-pay | Admitting: Pharmacist

## 2019-12-10 ENCOUNTER — Other Ambulatory Visit: Payer: Self-pay | Admitting: Family Medicine

## 2019-12-10 NOTE — Telephone Encounter (Signed)
Patient called and stated that after discussing with her husband, she would like to go see the ID doctor in [place] that Dr. Tommy Medal mentioned to her.  I assume this is Dr. Lorenda Cahill. Will send message to Dr. Tommy Medal to start process. I told patient I wasn't sure how the referral process goes, but I would find out and call her back.

## 2019-12-10 NOTE — Telephone Encounter (Signed)
Ok to refill??  Last office visit 01/28/2019.  Last refill 09/17/2019.

## 2019-12-11 ENCOUNTER — Encounter: Payer: Self-pay | Admitting: Acute Care

## 2019-12-11 ENCOUNTER — Other Ambulatory Visit: Payer: Self-pay

## 2019-12-11 ENCOUNTER — Ambulatory Visit (INDEPENDENT_AMBULATORY_CARE_PROVIDER_SITE_OTHER): Payer: Medicare Other | Admitting: Acute Care

## 2019-12-11 ENCOUNTER — Ambulatory Visit
Admission: RE | Admit: 2019-12-11 | Discharge: 2019-12-11 | Disposition: A | Discharge status: Home / Self Care | Payer: Medicare Other | Source: Ambulatory Visit | Attending: Acute Care | Admitting: Acute Care

## 2019-12-11 VITALS — BP 130/70 | HR 55 | Temp 97.3°F | Ht 64.0 in | Wt 130.1 lb

## 2019-12-11 DIAGNOSIS — Z87891 Personal history of nicotine dependence: Secondary | ICD-10-CM

## 2019-12-11 DIAGNOSIS — Z122 Encounter for screening for malignant neoplasm of respiratory organs: Secondary | ICD-10-CM

## 2019-12-11 NOTE — Progress Notes (Signed)
Shared Decision Making Visit Lung Cancer Screening Program [phone])   Eligibility:  Age 64 y.o.  Pack Years Smoking History Calculation 77  pack years (# packs/per year x # years smoked)  Recent History of coughing up blood  no  Unexplained weight loss? no ( >Than 15 pounds within the last 6 months )  Prior History Lung / other cancer no (Diagnosis within the last 5 years already requiring surveillance chest CT Scans).  Smoking Status Former Smoker  Former Smokers: Years since quit: 7 years  Quit Date: 11/09/2012  Visit Components:  Discussion included one or more decision making aids. yes  Discussion included risk/benefits of screening. yes  Discussion included potential follow up diagnostic testing for abnormal scans. yes  Discussion included meaning and risk of over diagnosis. yes  Discussion included meaning and risk of False Positives. yes  Discussion included meaning of total radiation exposure. yes  Counseling Included:  Importance of adherence to annual lung cancer LDCT screening. yes  Impact of comorbidities on ability to participate in the program. yes  Ability and willingness to under diagnostic treatment. yes  Smoking Cessation Counseling:  Current Smokers:   Discussed importance of smoking cessation. yes  Information about tobacco cessation classes and interventions provided to patient. yes  Patient provided with "ticket" for LDCT Scan. yes  Symptomatic Patient. no  Counseling  Diagnosis Code: Tobacco Use Z72.0  Asymptomatic Patient yes  Counseling (Intermediate counseling: > three minutes counseling) R4270  Former Smokers:   Discussed the importance of maintaining cigarette abstinence. yes  Diagnosis Code: Personal History of Nicotine Dependence. W23.762  Information about tobacco cessation classes and interventions provided to patient. Yes  Patient provided with "ticket" for LDCT Scan. yes  Written Order for Lung Cancer Screening  with LDCT placed in Epic. Yes (CT Chest Lung Cancer Screening Low Dose W/O CM) [id number] Z12.2-Screening of respiratory organs Z87.891-Personal history of nicotine dependence  I spent 25 minutes of face to face time with Ms. Slaubaugh discussing the risks and benefits of lung cancer screening. We viewed a power point together that explained in detail the above noted topics. We took the time to pause the power point at intervals to allow for questions to be asked and answered to ensure understanding. We discussed that she had taken the single most powerful action possible to decrease her risk of developing lung cancer when she quit smoking. I counseled her to remain smoke free, and to contact me if she ever had the desire to smoke again so that I can provide resources and tools to help support the effort to remain smoke free. We discussed the time and location of the scan, and that either  Doroteo Glassman RN or I will call with the results within  24-48 hours of receiving them. She has my card and contact information in the event she needs to speak with me, in addition to a copy of the power point we reviewed as a resource. She verbalized understanding of all of the above and had no further questions upon leaving the office.     I explained to the patient that there has been a high incidence of coronary artery disease noted on these exams. I explained that this is a non-gated exam therefore degree or severity cannot be determined. This patient is currently on statin therapy. I have asked the patient to follow-up with their PCP regarding any incidental finding of coronary artery disease and management with diet or medication as they feel is clinically indicated.  The patient verbalized understanding of the above and had no further questions.     Magdalen Spatz, NP 12/11/2019 1:51 PM

## 2019-12-11 NOTE — Patient Instructions (Signed)
Thank you for participating in the [facility]. It was our pleasure to meet you today. We will call you with the results of your scan within the next few days. Your scan will be assigned a Lung RADS category score by the physicians reading the scans.  This Lung RADS score determines follow up scanning.  See below for description of categories, and follow up screening recommendations. We will be in touch to schedule your follow up screening annually or based on recommendations of our providers. We will fax a copy of your scan results to your Primary Care Physician, or the physician who referred you to the program, to ensure they have the results. Please call the office if you have any questions or concerns regarding your scanning experience or results.  Our office number is [phone]. Please speak with Denise Phelps, RN. She is our Lung Cancer Screening RN. If she is unavailable when you call, please have the office staff send her a message. She will return your call at her earliest convenience. Remember, if your scan is normal, we will scan you annually as long as you continue to meet the criteria for the program. (Age 55-77, Current smoker or smoker who has quit within the last 15 years). If you are a smoker, remember, quitting is the single most powerful action that you can take to decrease your risk of lung cancer and other pulmonary, breathing related problems. We know quitting is hard, and we are here to help.  Please let us know if there is anything we can do to help you meet your goal of quitting. If you are a former smoker, congratulations. We are proud of you! Remain smoke free! Remember you can refer friends or family members through the number above.  We will screen them to make sure they meet criteria for the program. Thank you for helping us take better care of you by participating in Lung Screening.  Lung RADS Categories:  Lung RADS 1: no nodules  or definitely non-concerning nodules.  Recommendation is for a repeat annual scan in 12 months.  Lung RADS 2:  nodules that are non-concerning in appearance and behavior with a very low likelihood of becoming an active cancer. Recommendation is for a repeat annual scan in 12 months.  Lung RADS 3: nodules that are probably non-concerning , includes nodules with a low likelihood of becoming an active cancer.  Recommendation is for a 6-month repeat screening scan. Often noted after an upper respiratory illness. We will be in touch to make sure you have no questions, and to schedule your 6-month scan.  Lung RADS 4 A: nodules with concerning findings, recommendation is most often for a follow up scan in 3 months or additional testing based on our provider's assessment of the scan. We will be in touch to make sure you have no questions and to schedule the recommended 3 month follow up scan.  Lung RADS 4 B:  indicates findings that are concerning. We will be in touch with you to schedule additional diagnostic testing based on our provider's  assessment of the scan.   

## 2019-12-16 ENCOUNTER — Telehealth: Payer: Self-pay | Admitting: Acute Care

## 2019-12-16 DIAGNOSIS — Z87891 Personal history of nicotine dependence: Secondary | ICD-10-CM

## 2019-12-16 NOTE — Telephone Encounter (Signed)
Pt informed of CT results per Eric Form, NP.  PT verbalized understanding.  Copy sent to PCP.  Order placed for 6 mth f/u CT.

## 2019-12-16 NOTE — Progress Notes (Signed)
Please call patient and let her know her scan was read as a Lung  RADS 3, nodules that are probably benign findings, short term follow up suggested: includes nodules with a low likelihood of becoming a clinically active cancer. Radiology recommends a 6 month repeat LDCT follow up.  Let her know radiology feels this may be an infection. Please schedule her with me for an OV within the next few weeks. Place order for follow up CT in 6 months.

## 2019-12-17 ENCOUNTER — Other Ambulatory Visit: Payer: Self-pay | Admitting: Infectious Disease

## 2019-12-17 DIAGNOSIS — A310 Pulmonary mycobacterial infection: Secondary | ICD-10-CM

## 2019-12-17 NOTE — Telephone Encounter (Signed)
Called patient about her message. Patient was referring to pulmonologist note when she saw her on 12/11/19 and about the CT on 12/12/19. Patient was concerned about the cardiac findings on this test, Aortic Atherosclerosis. Informed patient that this is not a new finding that she has been dx with this in the past. Informed patient that is why she is on a statin medication for cholesterol and to decrease her risk of a heart attack. Patient stated she is also having some muscle aches from the statin medication. Informed patient that we do have a lipid clinic we can see about referring her to that might help. Patient also complained about getting SOB and sharp pain in chest when exercising. Patient stated the sharp pain does not last long and usually resolves on it's own. Patient has an appointment with Dr. Meda Coffee on 02/13/20. Will see if Dr. Meda Coffee wants to order any testing before patient's appointment due to recent symptoms.

## 2019-12-17 NOTE — Telephone Encounter (Signed)
Referral in epic

## 2019-12-18 NOTE — Telephone Encounter (Signed)
Sent pt a mychart message indicating that Dr. Meda Coffee suggest we get her in the clinic before 11/11 appt with her.  Held the pt a slot on Vin Bhagat PA-C schedule for next Tuesday 9/21 at 3:15 pm.  Advised the pt to call the office to confirm this appt or message me back with confirmation.  Will await pt follow-up.

## 2019-12-18 NOTE — Telephone Encounter (Signed)
Pt confirmed appt for next week with Vin Bhagat PA-C.  Pt is schedule to see Robbie Lis PA-C for next Tuesday 9/21 at 3:15 pm.  She is aware to arrive at 3 pm.  ED precautions provided to the pt, if symptoms persist or worsen between now and her appt next week. Pt agreed with this plan.

## 2019-12-24 ENCOUNTER — Ambulatory Visit: Payer: Medicare Other | Admitting: Physician Assistant

## 2020-01-09 LAB — MYCOBACTERIA,CULT W/FLUOROCHROME SMEAR
MICRO NUMBER:: 10868737
SMEAR:: NONE SEEN
SPECIMEN QUALITY:: ADEQUATE

## 2020-01-12 DIAGNOSIS — F419 Anxiety disorder, unspecified: Secondary | ICD-10-CM | POA: Insufficient documentation

## 2020-01-12 DIAGNOSIS — F32A Depression, unspecified: Secondary | ICD-10-CM | POA: Insufficient documentation

## 2020-01-12 DIAGNOSIS — I249 Acute ischemic heart disease, unspecified: Secondary | ICD-10-CM

## 2020-01-12 DIAGNOSIS — E785 Hyperlipidemia, unspecified: Secondary | ICD-10-CM | POA: Insufficient documentation

## 2020-01-12 DIAGNOSIS — R0989 Other specified symptoms and signs involving the circulatory and respiratory systems: Secondary | ICD-10-CM | POA: Insufficient documentation

## 2020-01-17 ENCOUNTER — Telehealth: Payer: Self-pay

## 2020-01-17 DIAGNOSIS — A310 Pulmonary mycobacterial infection: Secondary | ICD-10-CM

## 2020-01-17 NOTE — Telephone Encounter (Signed)
Patient called and left a message asking when she needed to follow up with Dr. Tommy Medal. Patient also wanted to discuss her visit regarding her visit with Dr. Lorenda Cahill at [facility]. Called patient to get more information, no answer and voicemail not setup.  Jaymz Traywick T Brooks Sailors

## 2020-01-20 NOTE — Telephone Encounter (Signed)
Patient currently taking Ethambutol (900 mg) and Azithromycin (500 mg).  She has stopped the clofazimine, has not restarted the rifampin. Please advise both on medication she should be taking and on when to follow up with you.  Landis Gandy, RN

## 2020-01-20 NOTE — Telephone Encounter (Signed)
Please advise when she should follow up with you.  Dr Romie Levee visit notes available in [facility]. Landis Gandy, RN

## 2020-01-21 MED ORDER — AZITHROMYCIN 250 MG PO TABS: 250.0000 mg | ORAL_TABLET | Freq: Every day | ORAL | 0 refills | Status: DC

## 2020-01-21 MED ORDER — AZITHROMYCIN 250 MG PO TABS: 250.0000 mg | ORAL_TABLET | Freq: Every day | ORAL | 2 refills | Status: DC

## 2020-01-21 MED ORDER — RIFAMPIN 300 MG PO CAPS: 600.0000 mg | ORAL_CAPSULE | Freq: Every day | ORAL | 3 refills | Status: DC

## 2020-01-21 NOTE — Telephone Encounter (Signed)
Will relay to patient. I may need to send new Rx for azithromycin - how many refills?   When would you like to see her in clinic for follow up?

## 2020-01-21 NOTE — Telephone Encounter (Signed)
When should she schedule with you?

## 2020-01-21 NOTE — Telephone Encounter (Signed)
Relayed recommendations, regimen to patient.  Sent in new azithromycin 250 mg supply (30 days locally, rest of refills to mail order). Removed clofaz from med list. Patient has rifampin at home, 300 mg tablets take 2 once daily.  She will resume these.  RN placed the rifampin prescription back on the active medication list.  She is experiencing some nausea, queasiness during the day. Please advise/send in prescription to local [facility] if appropriate. Scheduled for follow up 11/16. Landis Gandy, RN

## 2020-01-21 NOTE — Telephone Encounter (Signed)
Go ahead and give 11

## 2020-01-21 NOTE — Telephone Encounter (Signed)
These were Dr Romie Levee recs:  A) Stop clofazimine B) Resume rifampin as before C) Decrease azithromycin to 250 mg daily D) Continue ethambutol at current dose of 900 mg daily

## 2020-01-21 NOTE — Addendum Note (Signed)
Addended by: Landis Gandy on: 01/21/2020 04:59 PM   Modules accepted: Orders

## 2020-01-21 NOTE — Telephone Encounter (Signed)
Probably in November.

## 2020-01-22 NOTE — Telephone Encounter (Signed)
Could use Zofran 4mg  q 6 hrs prn

## 2020-01-23 ENCOUNTER — Other Ambulatory Visit: Payer: Self-pay | Admitting: Pharmacist

## 2020-01-23 DIAGNOSIS — R110 Nausea: Secondary | ICD-10-CM

## 2020-01-23 MED ORDER — ONDANSETRON 4 MG PO TBDP: 4.0000 mg | ORAL_TABLET | Freq: Four times a day (QID) | ORAL | 2 refills | Status: DC | PRN

## 2020-02-05 DIAGNOSIS — I712 Thoracic aortic aneurysm, without rupture, unspecified: Secondary | ICD-10-CM

## 2020-02-05 DIAGNOSIS — E785 Hyperlipidemia, unspecified: Secondary | ICD-10-CM

## 2020-02-05 DIAGNOSIS — I700 Atherosclerosis of aorta: Secondary | ICD-10-CM

## 2020-02-05 DIAGNOSIS — I2510 Atherosclerotic heart disease of native coronary artery without angina pectoris: Secondary | ICD-10-CM

## 2020-02-05 DIAGNOSIS — Z79899 Other long term (current) drug therapy: Secondary | ICD-10-CM

## 2020-02-05 NOTE — Telephone Encounter (Signed)
Lab orders placed on this pt to have done prior to her one year follow-up appt with Dr. Meda Coffee on 02/13/20. Pt will come in for labs on 02/10/20.  Orders placed were CMET, CBC, TSH, and LIPIDS. Pt made aware of lab appt and to come fasting to this lab appt.  Pt made aware of this via her active mychart account.

## 2020-02-10 ENCOUNTER — Other Ambulatory Visit: Payer: Medicare Other | Admitting: *Deleted

## 2020-02-10 ENCOUNTER — Other Ambulatory Visit: Payer: Self-pay

## 2020-02-10 DIAGNOSIS — E785 Hyperlipidemia, unspecified: Secondary | ICD-10-CM

## 2020-02-10 DIAGNOSIS — I700 Atherosclerosis of aorta: Secondary | ICD-10-CM

## 2020-02-10 DIAGNOSIS — Z79899 Other long term (current) drug therapy: Secondary | ICD-10-CM

## 2020-02-10 DIAGNOSIS — I712 Thoracic aortic aneurysm, without rupture, unspecified: Secondary | ICD-10-CM

## 2020-02-10 DIAGNOSIS — I2510 Atherosclerotic heart disease of native coronary artery without angina pectoris: Secondary | ICD-10-CM

## 2020-02-10 LAB — COMPREHENSIVE METABOLIC PANEL
ALT: 27 IU/L (ref 0–32)
AST: 25 IU/L (ref 0–40)
Albumin/Globulin Ratio: 2.3 — ABNORMAL HIGH (ref 1.2–2.2)
Albumin: 4.2 g/dL (ref 3.8–4.8)
Alkaline Phosphatase: 82 IU/L (ref 44–121)
BUN/Creatinine Ratio: 12 (ref 12–28)
BUN: 10 mg/dL (ref 8–27)
Bilirubin Total: 0.3 mg/dL (ref 0.0–1.2)
CO2: 24 mmol/L (ref 20–29)
Calcium: 9.2 mg/dL (ref 8.7–10.3)
Chloride: 109 mmol/L — ABNORMAL HIGH (ref 96–106)
Creatinine, Ser: 0.82 mg/dL (ref 0.57–1.00)
GFR calc Af Amer: 86 mL/min/{1.73_m2} (ref >59)
GFR calc non Af Amer: 75 mL/min/{1.73_m2} (ref >59)
Globulin, Total: 1.8 g/dL (ref 1.5–4.5)
Glucose: 90 mg/dL (ref 65–99)
Potassium: 4.2 mmol/L (ref 3.5–5.2)
Sodium: 144 mmol/L (ref 134–144)
Total Protein: 6 g/dL (ref 6.0–8.5)

## 2020-02-10 LAB — LIPID PANEL
Chol/HDL Ratio: 2.6 ratio (ref 0.0–4.4)
Cholesterol, Total: 151 mg/dL (ref 100–199)
HDL: 58 mg/dL (ref >39)
LDL Chol Calc (NIH): 76 mg/dL (ref 0–99)
Triglycerides: 89 mg/dL (ref 0–149)
VLDL Cholesterol Cal: 17 mg/dL (ref 5–40)

## 2020-02-10 LAB — TSH: TSH: 4.88 u[IU]/mL — ABNORMAL HIGH (ref 0.450–4.500)

## 2020-02-10 LAB — CBC
Hematocrit: 39.9 % (ref 34.0–46.6)
Hemoglobin: 13.8 g/dL (ref 11.1–15.9)
MCH: 30 pg (ref 26.6–33.0)
MCHC: 34.6 g/dL (ref 31.5–35.7)
MCV: 87 fL (ref 79–97)
Platelets: 148 10*3/uL — ABNORMAL LOW (ref 150–450)
RBC: 4.6 x10E6/uL (ref 3.77–5.28)
RDW: 13.1 % (ref 11.7–15.4)
WBC: 4.9 10*3/uL (ref 3.4–10.8)

## 2020-02-13 ENCOUNTER — Ambulatory Visit (INDEPENDENT_AMBULATORY_CARE_PROVIDER_SITE_OTHER): Payer: Medicare Other | Admitting: Cardiology

## 2020-02-13 ENCOUNTER — Encounter: Payer: Self-pay | Admitting: Cardiology

## 2020-02-13 ENCOUNTER — Other Ambulatory Visit: Payer: Self-pay

## 2020-02-13 VITALS — BP 140/88 | HR 70 | Ht 64.0 in | Wt 133.6 lb

## 2020-02-13 DIAGNOSIS — E785 Hyperlipidemia, unspecified: Secondary | ICD-10-CM

## 2020-02-13 DIAGNOSIS — R0609 Other forms of dyspnea: Secondary | ICD-10-CM

## 2020-02-13 DIAGNOSIS — R0600 Dyspnea, unspecified: Secondary | ICD-10-CM | POA: Diagnosis not present

## 2020-02-13 DIAGNOSIS — R0989 Other specified symptoms and signs involving the circulatory and respiratory systems: Secondary | ICD-10-CM

## 2020-02-13 DIAGNOSIS — I2510 Atherosclerotic heart disease of native coronary artery without angina pectoris: Secondary | ICD-10-CM

## 2020-02-13 DIAGNOSIS — R931 Abnormal findings on diagnostic imaging of heart and coronary circulation: Secondary | ICD-10-CM

## 2020-02-13 DIAGNOSIS — Z79899 Other long term (current) drug therapy: Secondary | ICD-10-CM

## 2020-02-13 DIAGNOSIS — R072 Precordial pain: Secondary | ICD-10-CM | POA: Diagnosis not present

## 2020-02-13 MED ORDER — METOPROLOL TARTRATE 100 MG PO TABS: 100.0000 mg | ORAL_TABLET | Freq: Once | ORAL | 0 refills | Status: DC

## 2020-02-13 NOTE — Patient Instructions (Signed)
Medication Instructions:   STOP TAKING ROSUVASTATIN NOW  *If you need a refill on your cardiac medications before your next appointment, please call your pharmacy*  Testing/Procedures:  Your physician has requested that you have an echocardiogram. Echocardiography is a painless test that uses sound waves to create images of your heart. It provides your doctor with information about the size and shape of your heart and how well your heart's chambers and valves are working. This procedure takes approximately one hour. There are no restrictions for this procedure.   Your cardiac CT will be scheduled at one of the below locations:   [facility] [address] [place], [place] [address] [phone]  If scheduled at [facility], please arrive at the [facility] main entrance of [facility] 30 minutes prior to test start time. Proceed to the [facility] Radiology Department (first floor) to check-in and test prep.  Please follow these instructions carefully (unless otherwise directed):  On the Night Before the Test: . Be sure to Drink plenty of water. . Do not consume any caffeinated/decaffeinated beverages or chocolate 12 hours prior to your test. . Do not take any antihistamines 12 hours prior to your test.  On the Day of the Test: . Drink plenty of water. Do not drink any water within one hour of the test. . Do not eat any food 4 hours prior to the test. . You may take your regular medications prior to the test.  . Take metoprolol 100 MG BY MOUTH (Lopressor) two hours prior to test. . HOLD Furosemide  morning of the test. . FEMALES- please wear underwire-free bra if available      After the Test: . Drink plenty of water. . After receiving IV contrast, you may experience a mild flushed feeling. This is normal. . On occasion, you may experience a mild rash up to 24 hours after the test. This is not dangerous. If this occurs, you can take Benadryl 25 mg  and increase your fluid intake. . If you experience trouble breathing, this can be serious. If it is severe call 911 IMMEDIATELY. If it is mild, please call our office.  Once we have confirmed authorization from your insurance company, we will call you to set up a date and time for your test. Based on how quickly your insurance processes prior authorizations requests, please allow up to 4 weeks to be contacted for scheduling your Cardiac CT appointment. Be advised that routine Cardiac CT appointments could be scheduled as many as 8 weeks after your provider has ordered it.  For non-scheduling related questions, please contact the cardiac imaging nurse navigator should you have any questions/concerns: Marchia Bond, Cardiac Imaging Nurse Navigator Burley Saver, Interim Cardiac Imaging Nurse [facility] Direct Office Dial: [phone]   For scheduling needs, including cancellations and rescheduling, please call Vivien Rota at [phone], option 3.    Follow-Up:  PLEASE CALL OR MYCHART [place] IN 3-4 WEEKS TO GET YOU SCHEDULED TO SEE DR. Meda Coffee IN THE OFFICE FOR 3-4 MONTHS OUT--SHE WILL BE SOON ADDING IN DAYS

## 2020-02-13 NOTE — Progress Notes (Signed)
Cardiology Office Note:    Date:  02/13/2020   ID:  Katrina Campbell, DOB [birth year], MRN [id number]  PCP:  Alycia Rossetti, MD  Cardiologist:  Ena Dawley, MD  Referring MD: Alycia Rossetti, MD   Reason for visit: Chest pain, dyspnea on exertion  History of Present Illness:    Katrina Campbell is a 64 y.o. female with a past medical history significant for coronary calcification and mildly dilated thoracic aortic aneurysm on CT.  Nuclear stress test, 06/2016,  with excellent functional capacity, no ischemia and normal LVEF.  She also has hyperlipidemia, hypertension, depression and COPD.  She was admitted to the hospital on 11/06/2017 with complaints of sudden onset of chest pain and dyspnea approximately 10 minutes after eating a carrot.  She was given nitroglycerin sublingually and apparently had about 30 seconds of syncope afterward.  Her systolic blood pressure dropped from 158 to 60.  She had a mild troponin elevation of 0.07 and a flat trend.  She underwent left heart cath on 11/07/2017 that showed mild nonobstructive CAD with 20% stenosis of her LAD and normal LV function.  She is being treated with risk factor modification, aspirin and statin.  The patient states that she has been experiencing more dyspnea on exertion as well as chest pain.  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  She continues to walk on a treadmill for 20 minutes every day, at the beginning of her exercise she feels chest pressure but she powers through it and it improves later on.  She denies any diaphoresis, no dizziness or falls.   Past Medical History:  Diagnosis Date  . Anxiety   . COPD (chronic obstructive pulmonary disease) ([place])   . Dental staining 07/15/2019  . Depression   . Hyperlipidemia   . Labile hypertension   . 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 LEFT HEART CATH AND CORONARY ANGIOGRAPHY N/A 11/07/2017   Procedure: LEFT HEART CATH AND CORONARY ANGIOGRAPHY;  Surgeon: Nelva Bush, MD;  Location: [facility];  Service: Cardiovascular;  Laterality: N/A;  . NASAL SINUS SURGERY  1986  . VIDEO BRONCHOSCOPY Bilateral 01/29/2019   Procedure: VIDEO BRONCHOSCOPY WITH FLUORO;  Surgeon: Marshell Garfinkel, MD;  Location: [facility];  Service: Cardiopulmonary;  Laterality: Bilateral;    Current Medications: Current Meds  Medication Sig  . albuterol (PROAIR HFA) 108 (90 Base) MCG/ACT inhaler Inhale 2 puffs into the lungs every 6 (six) hours as needed for wheezing or shortness of breath.  Marland Kitchen albuterol (PROVENTIL) (2.5 MG/3ML) 0.083% nebulizer solution Take 3 mLs (2.5 mg total) by nebulization every 6 (six) hours as needed for wheezing or shortness of breath.  Marland Kitchen aspirin EC 81 MG tablet Take 81 mg by mouth daily with lunch.   Derrill Memo ON 02/20/2020] azithromycin (ZITHROMAX) 250 MG tablet Take 1 tablet (250 mg total) by mouth daily.  Marland Kitchen buPROPion (WELLBUTRIN XL) 300 MG 24 hr tablet TAKE 1 TABLET DAILY  . Calcium Carb-Cholecalciferol (CALCIUM-VITAMIN D) 500-400 MG-UNIT TABS 1 a day  . ethambutol (MYAMBUTOL) 100 MG tablet Take 1 tablet (100 mg total) by mouth daily. Take with 2 414m tabs for 9064mtotal dose  . ethambutol (MYAMBUTOL) 400 MG tablet Take 2 tablets (800 mg total) by mouth daily. Take with 10025mablet of ethambutol for  951m total  . fluticasone (FLONASE) 50 MCG/ACT nasal spray Place 2 sprays into both nostrils as needed for allergies or rhinitis.   . furosemide (LASIX) 20 MG tablet Take 1 tablet (20 mg total) by mouth daily as needed for edema (Swelling).  .Marland KitchenguaiFENesin (MUCINEX) 600 MG 12 hr tablet Take 600 mg by mouth 2 (two) times daily.   . Magnesium 500 MG TABS Take 500 mg by mouth daily with lunch.  . Respiratory Therapy Supplies (FLUTTER) DEVI Use as directed  . rifampin  (RIFADIN) 300 MG capsule Take 2 capsules (600 mg total) by mouth daily.  . temazepam (RESTORIL) 15 MG capsule TAKE 1 CAPSULE AT BEDTIME AS NEEDED FOR SLEEP  . umeclidinium-vilanterol (ANORO ELLIPTA) 62.5-25 MCG/INH AEPB Inhale 1 puff into the lungs daily.  . vitamin C (ASCORBIC ACID) 500 MG tablet Take 500 mg by mouth daily.   . [DISCONTINUED] rosuvastatin (CRESTOR) 10 MG tablet TAKE 1 TABLET DAILY AT 6 P.M.     Allergies:   Patient has no known allergies.   Social History   Socioeconomic History  . Marital status: Married    Spouse name: Not on file  . Number of children: Not on file  . Years of education: Not on file  . Highest education level: Not on file  Occupational History  . Not on file  Tobacco Use  . Smoking status: Former Smoker    Packs/day: 1.75    Years: 44.00    Pack years: 77.00    Types: Cigarettes    Quit date: 11/09/2012    Years since quitting: 7.2  . Smokeless tobacco: Never Used  . Tobacco comment: vapor  Vaping Use  . Vaping Use: Former  Substance and Sexual Activity  . Alcohol use: Yes    Alcohol/week: 24.0 standard drinks    Types: 24 Cans of beer per week  . Drug use: No  . Sexual activity: Not Currently  Other Topics Concern  . Not on file  Social History Narrative  . Not on file   Social Determinants of Health   Financial Resource Strain:   . Difficulty of Paying Living Expenses: Not on file  Food Insecurity:   . Worried About RCharity fundraiserin the Last Year: Not on file  . Ran Out of Food in the Last Year: Not on file  Transportation Needs:   . Lack of Transportation (Medical): Not on file  . Lack of Transportation (Non-Medical): Not on file  Physical Activity:   . Days of Exercise per Week: Not on file  . Minutes of Exercise per Session: Not on file  Stress:   . Feeling of Stress : Not on file  Social Connections:   . Frequency of Communication with Friends and Family: Not on file  . Frequency of Social Gatherings with  Friends and Family: Not on file  . Attends Religious Services: Not on file  . Active Member of Clubs or Organizations: Not on file  . Attends CArchivistMeetings: Not on file  . Marital Status: Not on file     Family History: The patient's family history includes Asthma in an other family member; Congestive Heart Failure in her father and mother; Emphysema in her mother; Heart attack in her father and mother; Lung disease in her brother and sister. There is no history of Colon cancer.   ROS:   Please see the history of present illness.     All other systems reviewed and are negative.  EKGs/Labs/Other Studies Reviewed:    The following studies were reviewed today:  LEFT HEART CATH AND CORONARY ANGIOGRAPHY 11/07/2017  Conclusion   Conclusions: 1. Eccentric calcification with mild stenosis of the ostial LAD (~20%).  Otherwise, no angiographically significant coronary artery disease. 2. Normal left ventricular systolic function and filling pressure.  Recommendations: 1. Continue medical therapy and risk factor modification to prevent progression of CAD. 2. Monitor overnight to ensure the patient does not have recurrent chest pain.  Anticipate discharge home tomorrow morning.  Recommend Aspirin 69m daily for mild CAD and thoracic aortic aneurysm.    CT 05/2016 IMPRESSION: 1. Resolution of previously described nodule along the right minor fissure. 2. Advanced emphysema, without acute superimposed process. 3. Age advanced coronary artery atherosclerosis. Recommend assessment of coronary risk factors and consideration of medical therapy. 4. Borderline to mild ascending aortic aneurysm, unchanged. Recommend annual imaging followup by CTA or MRA. This recommendation follows 2010 ACCF/AHA/AATS/ACR/ASA/SCA/SCAI/SIR/STS/SVM Guidelines for the Diagnosis and Management of Patients with Thoracic Aortic Disease. Circulation. 2010; 121: [id number]  Nuclear stress test  06/2016 Study Highlights    Nuclear stress EF: 58%.  Blood pressure demonstrated a normal response to exercise.  There was no ST segment deviation noted during stress.  Defect 1: There is a small defect of moderate severity present in the apical anterior and apex location.  The study is normal.  This is a low risk study.  The left ventricular ejection fraction is normal (55-65%).  Normal stress nuclear study with breast attenuation but no ischemia; EF 58 with normal wall motion.     EKG:  EKG is ordered today.  It shows sinus bradycardia 58 bpm normal EKG unchanged from prior.  This was personally reviewed.  Recent Labs: 02/10/2020: ALT 27; BUN 10; Creatinine, Ser 0.82; Hemoglobin 13.8; Platelets 148; Potassium 4.2; Sodium 144; TSH 4.880   Recent Lipid Panel    Component Value Date/Time   CHOL 151 02/10/2020 0813   TRIG 89 02/10/2020 0813   HDL 58 02/10/2020 0813   CHOLHDL 2.6 02/10/2020 0813   CHOLHDL 3.4 02/21/2018 0845   VLDL 38 (H) 12/30/2015 1536   LDLCALC 76 02/10/2020 0813   LDLCALC 83 02/21/2018 0845    Physical Exam:    VS:  BP 140/88   Pulse 70   Ht _0  (1.626 m)   Wt 133 lb 9.6 oz (60.6 kg)   SpO2 98%   BMI 22.93 kg/m     Wt Readings from Last 3 Encounters:  02/13/20 133 lb 9.6 oz (60.6 kg)  12/11/19 130 lb 1.6 oz (59 kg)  11/11/19 131 lb (59.4 kg)     Physical Exam Vitals reviewed.  Constitutional:      General: She is not in acute distress.    Appearance: She is well-developed.  HENT:     Head: Normocephalic and atraumatic.  Neck:     Vascular: No JVD.  Cardiovascular:     Rate and Rhythm: Normal rate and regular rhythm.     Pulses: Intact distal pulses.     Heart sounds: Normal heart sounds. No murmur heard.  No friction rub. No gallop.   Pulmonary:     Effort: Pulmonary effort is normal. No respiratory distress.     Breath sounds: Normal breath sounds. No wheezing or rales.  Abdominal:     General: Bowel sounds are normal.      Palpations: Abdomen is soft.  Musculoskeletal:        General: No deformity. Normal range  of motion.     Cervical back: Normal range of motion and neck supple.  Skin:    General: Skin is warm and dry.  Neurological:     Mental Status: She is alert and oriented to person, place, and time.  Psychiatric:        Behavior: Behavior normal.        Thought Content: Thought content normal.        Judgment: Judgment normal.    Chest CTA 12/11/2019  1. Lung-RADS 3, probably benign findings. Central bronchial wall thickening in the right upper lobe with areas of airway impaction. 7.4 mm nodule in this region may reflect an impacted airway, representing sequelae of atypical infection. Consider therapy and short-term follow-up in 6 months is recommended with repeat low-dose chest CT without contrast (please use the following order, "CT CHEST LCS NODULE FOLLOW-UP W/O CM"). 2. Emphysema (ICD10-J43.9) and Aortic Atherosclerosis (ICD10-170.0)  ASSESSMENT:    1. DOE (dyspnea on exertion)   2. Precordial pain   3. Hyperlipidemia, unspecified hyperlipidemia type   4. Medication management   5. Labile hypertension   6. Coronary artery disease involving native coronary artery of native heart without angina pectoris   7. Abnormal findings on diagnostic imaging of heart and coronary circulation       PLAN:    In order of problems listed above:  CAD: Mild nonobstructive CAD by cardiac catheterization on 11/07/2017.  Her EKG is unchanged, however she has new symptoms of chest pain and dyspnea on exertion, I will perform coronary CTA to evaluate if this is possibly secondary to ischemia or we can attribute it to her underlying lung disease that include MAC and emphysema. She is advised to try to use albuterol inhaler prior to exercise.  Hyperlipidemia: Lipids are at goal after increasing rosuvastatin to 10 mg daily, however she is experiencing significant joint pain, we will try to discontinue  rosuvastatin and see if that improves her symptoms.  If that is the case we will refer her to the lipid clinic for statin alternatives.  Pulmonary nodules: Incidentally noted on CT in 2019, follow-up scan in May 2020 showed decrease in size, most compatible with inflammatory/infectious process. She is being followed by ID and pulmonary specialist on antibiotics for MAC in the last year.  No significant improvement so far. We will obtain an echocardiogram to evaluate for potential pulmonary hypertension.  Intermittent lower extremity edema -improved with Lasix 20 mg daily.  Medication Adjustments/Labs and Tests Ordered: Current medicines are reviewed at length with the patient today.  Concerns regarding medicines are outlined above. Labs and tests ordered and medication changes are outlined in the patient instructions below:  Patient Instructions  Medication Instructions:   STOP TAKING ROSUVASTATIN NOW  *If you need a refill on your cardiac medications before your next appointment, please call your pharmacy*  Testing/Procedures:  Your physician has requested that you have an echocardiogram. Echocardiography is a painless test that uses sound waves to create images of your heart. It provides your doctor with information about the size and shape of your heart and how well your heart's chambers and valves are working. This procedure takes approximately one hour. There are no restrictions for this procedure.   Your cardiac CT will be scheduled at one of the below locations:   [facility] [address] [place], [place] [address] [phone]  If scheduled at [facility], please arrive at the [facility] main entrance of [facility] 30  minutes prior to test start time. Proceed to the [facility] Radiology Department (first floor) to check-in and test prep.  Please follow these instructions carefully (unless otherwise directed):  On the Night Before the  Test: . Be sure to Drink plenty of water. . Do not consume any caffeinated/decaffeinated beverages or chocolate 12 hours prior to your test. . Do not take any antihistamines 12 hours prior to your test.  On the Day of the Test: . Drink plenty of water. Do not drink any water within one hour of the test. . Do not eat any food 4 hours prior to the test. . You may take your regular medications prior to the test.  . Take metoprolol 100 MG BY MOUTH (Lopressor) two hours prior to test. . HOLD Furosemide  morning of the test. . FEMALES- please wear underwire-free bra if available      After the Test: . Drink plenty of water. . After receiving IV contrast, you may experience a mild flushed feeling. This is normal. . On occasion, you may experience a mild rash up to 24 hours after the test. This is not dangerous. If this occurs, you can take Benadryl 25 mg and increase your fluid intake. . If you experience trouble breathing, this can be serious. If it is severe call 911 IMMEDIATELY. If it is mild, please call our office.  Once we have confirmed authorization from your insurance company, we will call you to set up a date and time for your test. Based on how quickly your insurance processes prior authorizations requests, please allow up to 4 weeks to be contacted for scheduling your Cardiac CT appointment. Be advised that routine Cardiac CT appointments could be scheduled as many as 8 weeks after your provider has ordered it.  For non-scheduling related questions, please contact the cardiac imaging nurse navigator should you have any questions/concerns: Marchia Bond, Cardiac Imaging Nurse Navigator Burley Saver, Interim Cardiac Imaging Nurse [place] [facility] Direct Office Dial: [phone]   For scheduling needs, including cancellations and rescheduling, please call Vivien Rota at [phone], option 3.    Follow-Up:  PLEASE CALL OR MYCHART [place] IN 3-4 WEEKS TO GET YOU  SCHEDULED TO SEE DR. Meda Coffee IN THE OFFICE FOR 3-4 MONTHS OUT--SHE WILL BE SOON ADDING IN DAYS      Signed, Ena Dawley, MD  02/13/2020 10:43 AM    [place]

## 2020-02-18 ENCOUNTER — Telehealth: Payer: Self-pay

## 2020-02-18 ENCOUNTER — Encounter: Payer: Self-pay | Admitting: Infectious Disease

## 2020-02-18 ENCOUNTER — Ambulatory Visit (INDEPENDENT_AMBULATORY_CARE_PROVIDER_SITE_OTHER): Payer: Medicare Other | Admitting: Infectious Disease

## 2020-02-18 ENCOUNTER — Telehealth: Payer: Self-pay | Admitting: *Deleted

## 2020-02-18 ENCOUNTER — Other Ambulatory Visit: Payer: Self-pay

## 2020-02-18 VITALS — BP 145/83 | HR 60 | Temp 99.0°F | Wt 133.0 lb

## 2020-02-18 DIAGNOSIS — I2510 Atherosclerotic heart disease of native coronary artery without angina pectoris: Secondary | ICD-10-CM | POA: Diagnosis not present

## 2020-02-18 DIAGNOSIS — J42 Unspecified chronic bronchitis: Secondary | ICD-10-CM | POA: Diagnosis not present

## 2020-02-18 DIAGNOSIS — R110 Nausea: Secondary | ICD-10-CM

## 2020-02-18 DIAGNOSIS — J471 Bronchiectasis with (acute) exacerbation: Secondary | ICD-10-CM

## 2020-02-18 DIAGNOSIS — A310 Pulmonary mycobacterial infection: Secondary | ICD-10-CM | POA: Diagnosis present

## 2020-02-18 DIAGNOSIS — H9190 Unspecified hearing loss, unspecified ear: Secondary | ICD-10-CM | POA: Diagnosis not present

## 2020-02-18 DIAGNOSIS — F320 Major depressive disorder, single episode, mild: Secondary | ICD-10-CM | POA: Diagnosis not present

## 2020-02-18 MED ORDER — ETHAMBUTOL HCL 100 MG PO TABS
100.0000 mg | ORAL_TABLET | Freq: Every day | ORAL | 3 refills | Status: DC
Start: 2020-02-18 — End: 2020-05-21

## 2020-02-18 NOTE — Telephone Encounter (Signed)
Left the pt a message to call the office back and ask for me, so that I can schedule her next follow-up appointment with Dr. Meda Coffee for sometime in Feb 2022.  Pt was made aware at last office visit with Dr. Meda Coffee, that she would be getting a call back soon to arrange this appointment.  Will await pts call back.

## 2020-02-18 NOTE — Telephone Encounter (Signed)
Pt contacted me back via mychart message about scheduling her 3 month follow-up appt with Dr. Meda Coffee.  Scheduled the pt to come into the office to see Dr. Meda Coffee for follow-up for 05/21/20 at 0900.  Made her aware of this via mychart message.  Advised her to message me back if this appt date and time is a conflict with her schedule.

## 2020-02-18 NOTE — Progress Notes (Signed)
Subjective:   Chief complaint: nausea related to her M avium medications   Patient ID: Katrina Campbell, female    DOB: February 01, 1956, 64 y.o.   MRN: [id number]  HPI   64 year old female, former smoker quit 2014. 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  She had been on a 3 times a week regimen  but had not had a great deal of improvement  We then changed her to azithromycin 500 mg daily rifampin 600 mg daily and ethambutol 900 mg daily.  Since changed to new regimen her coughing improved dramatically.  She is actually having trouble getting up phlegm even using the vest provided by pulmonary.  She had in the interim  had problems with staining of her teeth with red flecks throughout which the dental hygienist and dentist had a great deal of difficulty removing from her teeth.  [name] endeavored to procure Clofazamine the idea of swapping this drug in for her rifamycin.  However she initially did not want to do this.  Since I last saw her she had started this regimen on 11/26/2019.  Sputum sent that same day was smear negative for AFB and final growth negative.   Cough was worse since switch from RIF to clofazaimine.  Clear mucous + occ blood streaks. She wished for 2nd opinion and we referred her to Dr. Lorenda Cahill at [facility] who saw her on 01/13/2020.  Patient had relayed to him decreased hearing and due to concerns re possible macrolide causing this her azithromycin dose was reduced.  Dr. Lorenda Cahill was not entirely convinced how much of pts symptoms were due to MAvium vs her COPD that underlies this.  She has resumed her azithromycin at lower dose, rifampin and ethambutol.  She has improvement in cough again but chronic nausea and does not enjoy the ordeal of taking all of the meds she currently has to take for rx  of M avium.     Past Medical History:  Diagnosis Date  . Anxiety   . COPD (chronic obstructive pulmonary disease) ([place])   . Dental staining 07/15/2019  . Depression   . Hyperlipidemia   . Labile hypertension   . Pulmonary mycobacterial infection ([place]) 10/24/2018    Past Surgical History:  Procedure Laterality Date  . LEFT HEART CATH AND CORONARY ANGIOGRAPHY N/A 11/07/2017   Procedure: LEFT HEART CATH AND CORONARY ANGIOGRAPHY;  Surgeon: Nelva Bush, MD;  Location: [place] [facility];  Service: Cardiovascular;  Laterality: N/A;  . NASAL SINUS SURGERY  1986  . VIDEO BRONCHOSCOPY Bilateral 01/29/2019   Procedure: VIDEO BRONCHOSCOPY WITH FLUORO;  Surgeon: Marshell Garfinkel, MD;  Location: [facility];  Service: Cardiopulmonary;  Laterality: Bilateral;    Family History  Problem Relation Age of Onset  . Heart attack Mother   . Emphysema Mother   . Congestive Heart Failure Mother   . Heart attack Father   . Congestive Heart Failure Father   . Asthma Other   . Lung disease Sister   . Lung disease Brother   . Colon cancer Neg Hx       Social History   Socioeconomic History  . Marital status: Married    Spouse name: Not on file  . Number of children: Not on file  . Years of education: Not on file  . Highest education level: Not on file  Occupational History  .  Not on file  Tobacco Use  . Smoking status: Former Smoker    Packs/day: 1.75    Years: 44.00    Pack years: 77.00    Types: Cigarettes    Quit date: 11/09/2012    Years since quitting: 7.2  . Smokeless tobacco: Never Used  . Tobacco comment: vapor  Vaping Use  . Vaping Use: Former  Substance and Sexual Activity  . Alcohol use: Yes    Alcohol/week: 24.0 standard drinks    Types: 24 Cans of beer per week  . Drug use: No  . Sexual activity: Not Currently  Other Topics Concern  . Not on file  Social History Narrative  . Not on file   Social Determinants of Health   Financial Resource Strain:   .  Difficulty of Paying Living Expenses: Not on file  Food Insecurity:   . Worried About Charity fundraiser in the Last Year: Not on file  . Ran Out of Food in the Last Year: Not on file  Transportation Needs:   . Lack of Transportation (Medical): Not on file  . Lack of Transportation (Non-Medical): Not on file  Physical Activity:   . Days of Exercise per Week: Not on file  . Minutes of Exercise per Session: Not on file  Stress:   . Feeling of Stress : Not on file  Social Connections:   . Frequency of Communication with Friends and Family: Not on file  . Frequency of Social Gatherings with Friends and Family: Not on file  . Attends Religious Services: Not on file  . Active Member of Clubs or Organizations: Not on file  . Attends Archivist Meetings: Not on file  . Marital Status: Not on file    No Known Allergies   Current Outpatient Medications:  .  albuterol (PROAIR HFA) 108 (90 Base) MCG/ACT inhaler, Inhale 2 puffs into the lungs every 6 (six) hours as needed for wheezing or shortness of breath., Disp: 24 g, Rfl: 0 .  albuterol (PROVENTIL) (2.5 MG/3ML) 0.083% nebulizer solution, Take 3 mLs (2.5 mg total) by nebulization every 6 (six) hours as needed for wheezing or shortness of breath., Disp: 225 mL, Rfl: 12 .  aspirin EC 81 MG tablet, Take 81 mg by mouth daily with lunch. , Disp: , Rfl:  .  [START ON 02/20/2020] azithromycin (ZITHROMAX) 250 MG tablet, Take 1 tablet (250 mg total) by mouth daily., Disp: 90 tablet, Rfl: 2 .  buPROPion (WELLBUTRIN XL) 300 MG 24 hr tablet, TAKE 1 TABLET DAILY, Disp: 90 tablet, Rfl: 3 .  Calcium Carb-Cholecalciferol (CALCIUM-VITAMIN D) 500-400 MG-UNIT TABS, 1 a day, Disp: 60 tablet, Rfl:  .  ethambutol (MYAMBUTOL) 100 MG tablet, Take 1 tablet (100 mg total) by mouth daily. Take with 2 47m tabs for 9040mtotal dose, Disp: 90 tablet, Rfl: 3 .  ethambutol (MYAMBUTOL) 400 MG tablet, Take 2 tablets (800 mg total) by mouth daily. Take with 10068mtablet of ethambutol for 900m77mtal, Disp: 180 tablet, Rfl: 3 .  fluticasone (FLONASE) 50 MCG/ACT nasal spray, Place 2 sprays into both nostrils as needed for allergies or rhinitis. , Disp: , Rfl:  .  furosemide (LASIX) 20 MG tablet, Take 1 tablet (20 mg total) by mouth daily as needed for edema (Swelling)., Disp: 30 tablet, Rfl: 6 .  guaiFENesin (MUCINEX) 600 MG 12 hr tablet, Take 600 mg by mouth 2 (two) times daily. , Disp: , Rfl:  .  Magnesium 500 MG TABS, Take  500 mg by mouth daily with lunch., Disp: , Rfl:  .  Respiratory Therapy Supplies (FLUTTER) DEVI, Use as directed, Disp: 1 each, Rfl: 0 .  rifampin (RIFADIN) 300 MG capsule, Take 2 capsules (600 mg total) by mouth daily., Disp: 180 capsule, Rfl: 3 .  temazepam (RESTORIL) 15 MG capsule, TAKE 1 CAPSULE AT BEDTIME AS NEEDED FOR SLEEP, Disp: 90 capsule, Rfl: 0 .  umeclidinium-vilanterol (ANORO ELLIPTA) 62.5-25 MCG/INH AEPB, Inhale 1 puff into the lungs daily., Disp: 180 each, Rfl: 3 .  vitamin C (ASCORBIC ACID) 500 MG tablet, Take 500 mg by mouth daily. , Disp: , Rfl:  .  metoprolol tartrate (LOPRESSOR) 100 MG tablet, Take 1 tablet (100 mg total) by mouth once for 1 dose. Take 2 hours prior to your Coronary CT., Disp: 1 tablet, Rfl: 0   Review of Systems  Constitutional: Negative for chills and fever.  HENT: Negative for congestion and sore throat.   Eyes: Negative for photophobia.  Respiratory: Positive for cough and shortness of breath. Negative for wheezing.   Cardiovascular: Negative for chest pain, palpitations and leg swelling.  Gastrointestinal: Positive for nausea. Negative for abdominal pain, blood in stool, constipation, diarrhea and vomiting.  Genitourinary: Negative for dysuria, flank pain and hematuria.  Musculoskeletal: Negative for back pain and myalgias.  Skin: Negative for rash.  Neurological: Negative for dizziness, weakness and headaches.  Hematological: Does not bruise/bleed easily.  Psychiatric/Behavioral: Positive  for dysphoric mood. Negative for suicidal ideas.       Objective:   Physical Exam Constitutional:      General: She is not in acute distress.    Appearance: She is not diaphoretic.  HENT:     Head: Normocephalic and atraumatic.     Right Ear: External ear normal.     Left Ear: External ear normal.     Nose: Nose normal.     Mouth/Throat:     Pharynx: No oropharyngeal exudate.  Eyes:     General: No scleral icterus.    Conjunctiva/sclera: Conjunctivae normal.     Pupils: Pupils are equal, round, and reactive to light.  Cardiovascular:     Rate and Rhythm: Normal rate and regular rhythm.     Heart sounds: Normal heart sounds. No murmur heard.  No friction rub. No gallop.   Pulmonary:     Effort: Pulmonary effort is normal. Prolonged expiration present. No respiratory distress.     Breath sounds: Normal breath sounds. Decreased air movement present. No stridor. No wheezing.  Abdominal:     General: Bowel sounds are normal.     Palpations: Abdomen is soft.  Musculoskeletal:        General: No tenderness. Normal range of motion.     Cervical back: Normal range of motion and neck supple.  Lymphadenopathy:     Cervical: No cervical adenopathy.  Skin:    General: Skin is warm and dry.     Coloration: Skin is not pale.     Findings: No erythema or rash.  Neurological:     General: No focal deficit present.     Mental Status: She is alert and oriented to person, place, and time.     Coordination: Coordination normal.  Psychiatric:        Attention and Perception: Attention and perception normal.        Mood and Affect: Mood is depressed. Affect is tearful.        Speech: Speech normal.        Behavior:  Behavior normal.        Thought Content: Thought content normal.        Cognition and Memory: Cognition and memory normal.        Judgment: Judgment normal.        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  COPD followed by Dr. Valeta Harms  Nausea: LFT's fine. Symptomatic treatment  Hearing loss: would benefit from formal audiology consult.will order

## 2020-02-18 NOTE — Telephone Encounter (Signed)
Patient called regarding sputum culture label. She states that the label has her lung doctor's name on it instead of Dr. Tommy Medal. RN advised patient that she can still use the sputum cup with label as long as her name, birthday and MRN number are on the label. Patient verified this information is present and has no further questions. Patient will drop off sample when convenient for her.   Beryle Flock, RN

## 2020-02-19 ENCOUNTER — Other Ambulatory Visit: Payer: Medicare Other

## 2020-02-19 DIAGNOSIS — A310 Pulmonary mycobacterial infection: Secondary | ICD-10-CM

## 2020-02-19 DIAGNOSIS — J471 Bronchiectasis with (acute) exacerbation: Secondary | ICD-10-CM

## 2020-02-19 DIAGNOSIS — J42 Unspecified chronic bronchitis: Secondary | ICD-10-CM

## 2020-02-19 NOTE — Addendum Note (Signed)
Addended by: Caffie Pinto on: 02/19/2020 11:57 AM   Modules accepted: Orders

## 2020-02-20 ENCOUNTER — Ambulatory Visit (HOSPITAL_COMMUNITY): Payer: Medicare Other

## 2020-03-02 ENCOUNTER — Telehealth (HOSPITAL_COMMUNITY): Payer: Self-pay | Admitting: Emergency Medicine

## 2020-03-02 NOTE — Telephone Encounter (Signed)
Reaching out to patient to offer assistance regarding upcoming cardiac imaging study; pt verbalizes understanding of appt date/time, parking situation and where to check in, pre-test NPO status and medications ordered, and verified current allergies; name and call back number provided for further questions should they arise Karn Derk RN Navigator Cardiac Imaging [facility] [phone] office [phone] cell 

## 2020-03-03 ENCOUNTER — Other Ambulatory Visit: Payer: Self-pay

## 2020-03-03 ENCOUNTER — Ambulatory Visit (HOSPITAL_COMMUNITY)
Admission: RE | Admit: 2020-03-03 | Discharge: 2020-03-03 | Disposition: A | Discharge status: Home / Self Care | Payer: Medicare Other | Source: Ambulatory Visit | Attending: Cardiology | Admitting: Cardiology

## 2020-03-03 ENCOUNTER — Encounter: Payer: Medicare Other | Admitting: *Deleted

## 2020-03-03 DIAGNOSIS — R0989 Other specified symptoms and signs involving the circulatory and respiratory systems: Secondary | ICD-10-CM

## 2020-03-03 DIAGNOSIS — R0600 Dyspnea, unspecified: Secondary | ICD-10-CM | POA: Diagnosis present

## 2020-03-03 DIAGNOSIS — Z006 Encounter for examination for normal comparison and control in clinical research program: Secondary | ICD-10-CM

## 2020-03-03 DIAGNOSIS — I2510 Atherosclerotic heart disease of native coronary artery without angina pectoris: Secondary | ICD-10-CM

## 2020-03-03 DIAGNOSIS — R072 Precordial pain: Secondary | ICD-10-CM

## 2020-03-03 DIAGNOSIS — E785 Hyperlipidemia, unspecified: Secondary | ICD-10-CM

## 2020-03-03 DIAGNOSIS — I700 Atherosclerosis of aorta: Secondary | ICD-10-CM | POA: Diagnosis not present

## 2020-03-03 DIAGNOSIS — Z79899 Other long term (current) drug therapy: Secondary | ICD-10-CM

## 2020-03-03 DIAGNOSIS — R0609 Other forms of dyspnea: Secondary | ICD-10-CM

## 2020-03-03 MED ORDER — NITROGLYCERIN 0.4 MG SL SUBL
SUBLINGUAL_TABLET | SUBLINGUAL | Status: AC
  Filled 2020-03-03: qty 2

## 2020-03-03 MED ORDER — NITROGLYCERIN 0.4 MG SL SUBL
0.8000 mg | SUBLINGUAL_TABLET | Freq: Once | SUBLINGUAL | Status: AC
  Administered 2020-03-03: 0.8 mg via SUBLINGUAL

## 2020-03-03 MED ORDER — IOHEXOL 350 MG/ML SOLN
80.0000 mL | Freq: Once | INTRAVENOUS | Status: AC | PRN
  Administered 2020-03-03: 80 mL via INTRAVENOUS

## 2020-03-03 NOTE — Progress Notes (Signed)
CT scan completed. Tolerated well. D/C home ambulatory, awake and alert. In no distress. 

## 2020-03-03 NOTE — Research (Signed)
Subject Name: Katrina Campbell  Subject met inclusion and exclusion criteria.  The informed consent form, study requirements and expectations were reviewed with the subject and questions and concerns were addressed prior to the signing of the consent form.  The subject verbalized understanding of the trial requirements.  The subject agreed to participate in the IDENTIFY trial and signed the informed consent at 0710 on 03/03/20  The informed consent was obtained prior to performance of any protocol-specific procedures for the subject.  A copy of the signed informed consent was given to the subject and a copy was placed in the subject's medical record.   Young, Rachel C   

## 2020-03-04 ENCOUNTER — Other Ambulatory Visit: Payer: Self-pay | Admitting: Family Medicine

## 2020-03-04 ENCOUNTER — Telehealth: Payer: Self-pay | Admitting: *Deleted

## 2020-03-04 DIAGNOSIS — E785 Hyperlipidemia, unspecified: Secondary | ICD-10-CM

## 2020-03-04 NOTE — Telephone Encounter (Signed)
-----   Message from Dorothy Spark, MD sent at 03/03/2020 11:15 PM EST ----- Coronary calcium score of 261. This was 62 percentile for age and sex matched control. Mild non-obstructive CAD (25-49%) in the distal left main and proximal portions of LAD and LCX arteries.   Start rosuvastatin 10 mg po QHS, repeat liipds and LFTs in 4-6 weeeks

## 2020-03-04 NOTE — Telephone Encounter (Signed)
I spoke with patient and reviewed results with her. Rosuvastatin was stopped at last office visit due to joint pain.  Plan was to refer to lipid clinic for statin alternative if pain improved off Rosuvastatin.  Patient reports joint pain has decreased since stopping Rosuvastatin.  She would like to proceed with referral to lipid clinic.  Appointment scheduled for 03/09/20 at 8:30

## 2020-03-04 NOTE — Telephone Encounter (Signed)
Ok to refill??  Last office visit 01/28/2019.  Last refill 12/10/2019.

## 2020-03-04 NOTE — Telephone Encounter (Signed)
Pt needs OV, will give 1 month refill

## 2020-03-05 ENCOUNTER — Ambulatory Visit: Payer: Self-pay

## 2020-03-05 LAB — SPECIMEN STATUS REPORT

## 2020-03-05 LAB — T3, FREE: T3, Free: 3.3 pg/mL (ref 2.0–4.4)

## 2020-03-05 LAB — T4, FREE: Free T4: 0.83 ng/dL (ref 0.82–1.77)

## 2020-03-06 ENCOUNTER — Other Ambulatory Visit: Payer: Self-pay

## 2020-03-06 ENCOUNTER — Ambulatory Visit
Admission: EM | Admit: 2020-03-06 | Discharge: 2020-03-06 | Disposition: A | Discharge status: Home / Self Care | Payer: Medicare Other | Attending: Emergency Medicine | Admitting: Emergency Medicine

## 2020-03-06 DIAGNOSIS — M545 Low back pain, unspecified: Secondary | ICD-10-CM | POA: Insufficient documentation

## 2020-03-06 DIAGNOSIS — Z79899 Other long term (current) drug therapy: Secondary | ICD-10-CM | POA: Diagnosis not present

## 2020-03-06 DIAGNOSIS — Z87891 Personal history of nicotine dependence: Secondary | ICD-10-CM | POA: Insufficient documentation

## 2020-03-06 LAB — POCT URINALYSIS DIP (MANUAL ENTRY)
Bilirubin, UA: NEGATIVE
Blood, UA: NEGATIVE
Glucose, UA: NEGATIVE mg/dL
Ketones, POC UA: NEGATIVE mg/dL
Leukocytes, UA: NEGATIVE
Nitrite, UA: NEGATIVE
Protein Ur, POC: NEGATIVE mg/dL
Spec Grav, UA: 1.015 (ref 1.010–1.025)
Urobilinogen, UA: 0.2 E.U./dL
pH, UA: 7 (ref 5.0–8.0)

## 2020-03-06 MED ORDER — DEXAMETHASONE SODIUM PHOSPHATE 10 MG/ML IJ SOLN
10.0000 mg | Freq: Once | INTRAMUSCULAR | Status: AC
  Administered 2020-03-06: 10 mg via INTRAMUSCULAR

## 2020-03-06 MED ORDER — CYCLOBENZAPRINE HCL 5 MG PO TABS: 5.0000 mg | ORAL_TABLET | Freq: Three times a day (TID) | ORAL | 0 refills | Status: DC | PRN

## 2020-03-06 MED ORDER — PREDNISONE 10 MG PO TABS: 20.0000 mg | ORAL_TABLET | Freq: Every day | ORAL | 0 refills | Status: DC

## 2020-03-06 NOTE — ED Provider Notes (Signed)
Katrina Campbell   [id number] 03/06/20 Arrival Time: 0803   Chief Complaint  Patient presents with  . Back Pain     SUBJECTIVE: History from: patient.  Katrina Campbell is a 64 y.o. female who presented to the urgent care with a complaint of low back pain for the past 2 to 3 days.  Denies any precipitating event, trauma or injury.  Localized the pain to the bilateral low back that radiates to upper thigh.  She describes the pain as constant and achy.  She has tried OTC medications without relief.  Her symptoms are made worse with ROM.  She denies similar symptoms in the past.  Denies chills, fever, nausea, vomiting, diarrhea, paresthesia, LOC, diplopia.  ROS: As per HPI.  All other pertinent ROS negative.     Past Medical History:  Diagnosis Date  . Anxiety   . COPD (chronic obstructive pulmonary disease) ([place])   . Dental staining 07/15/2019  . Depression   . Hyperlipidemia   . Labile hypertension   . Pulmonary mycobacterial infection ([place]) 10/24/2018   Past Surgical History:  Procedure Laterality Date  . LEFT HEART CATH AND CORONARY ANGIOGRAPHY N/A 11/07/2017   Procedure: LEFT HEART CATH AND CORONARY ANGIOGRAPHY;  Surgeon: Nelva Bush, MD;  Location: [facility] [facility];  Service: Cardiovascular;  Laterality: N/A;  . NASAL SINUS SURGERY  1986  . VIDEO BRONCHOSCOPY Bilateral 01/29/2019   Procedure: VIDEO BRONCHOSCOPY WITH FLUORO;  Surgeon: Marshell Garfinkel, MD;  Location: [facility];  Service: Cardiopulmonary;  Laterality: Bilateral;   No Known Allergies No current facility-administered medications on file prior to encounter.   Current Outpatient Medications on File Prior to Encounter  Medication Sig Dispense Refill  . albuterol (PROAIR HFA) 108 (90 Base) MCG/ACT inhaler Inhale 2 puffs into the lungs every 6 (six) hours as needed for wheezing or shortness of breath. 24 g 0  . albuterol (PROVENTIL) (2.5 MG/3ML) 0.083% nebulizer solution Take 3 mLs (2.5 mg total) by  nebulization every 6 (six) hours as needed for wheezing or shortness of breath. 225 mL 12  . aspirin EC 81 MG tablet Take 81 mg by mouth daily with lunch.     Marland Kitchen azithromycin (ZITHROMAX) 250 MG tablet Take 1 tablet (250 mg total) by mouth daily. 90 tablet 2  . buPROPion (WELLBUTRIN XL) 300 MG 24 hr tablet TAKE 1 TABLET DAILY 90 tablet 3  . Calcium Carb-Cholecalciferol (CALCIUM-VITAMIN D) 500-400 MG-UNIT TABS 1 a day 60 tablet   . ethambutol (MYAMBUTOL) 100 MG tablet Take 1 tablet (100 mg total) by mouth daily. Take with 2 400mg  tabs for 900mg  total dose 90 tablet 3  . ethambutol (MYAMBUTOL) 400 MG tablet Take 2 tablets (800 mg total) by mouth daily. Take with 100mg  tablet of ethambutol for 900mg  total 180 tablet 3  . fluticasone (FLONASE) 50 MCG/ACT nasal spray Place 2 sprays into both nostrils as needed for allergies or rhinitis.     . furosemide (LASIX) 20 MG tablet Take 1 tablet (20 mg total) by mouth daily as needed for edema (Swelling). 30 tablet 6  . guaiFENesin (MUCINEX) 600 MG 12 hr tablet Take 600 mg by mouth 2 (two) times daily.     . Magnesium 500 MG TABS Take 500 mg by mouth daily with lunch.    . metoprolol tartrate (LOPRESSOR) 100 MG tablet Take 1 tablet (100 mg total) by mouth once for 1 dose. Take 2 hours prior to your Coronary CT. 1 tablet 0  . Respiratory Therapy Supplies (  FLUTTER) DEVI Use as directed 1 each 0  . rifampin (RIFADIN) 300 MG capsule Take 2 capsules (600 mg total) by mouth daily. 180 capsule 3  . temazepam (RESTORIL) 15 MG capsule TAKE 1 CAPSULE AT BEDTIME AS NEEDED FOR SLEEP 30 capsule 0  . umeclidinium-vilanterol (ANORO ELLIPTA) 62.5-25 MCG/INH AEPB Inhale 1 puff into the lungs daily. 180 each 3  . vitamin C (ASCORBIC ACID) 500 MG tablet Take 500 mg by mouth daily.      Social History   Socioeconomic History  . Marital status: Married    Spouse name: Not on file  . Number of children: Not on file  . Years of education: Not on file  . Highest education level:  Not on file  Occupational History  . Not on file  Tobacco Use  . Smoking status: Former Smoker    Packs/day: 1.75    Years: 44.00    Pack years: 77.00    Types: Cigarettes    Quit date: 11/09/2012    Years since quitting: 7.3  . Smokeless tobacco: Never Used  . Tobacco comment: vapor  Vaping Use  . Vaping Use: Former  Substance and Sexual Activity  . Alcohol use: Yes    Alcohol/week: 24.0 standard drinks    Types: 24 Cans of beer per week  . Drug use: No  . Sexual activity: Not Currently  Other Topics Concern  . Not on file  Social History Narrative  . Not on file   Social Determinants of Health   Financial Resource Strain:   . Difficulty of Paying Living Expenses: Not on file  Food Insecurity:   . Worried About Charity fundraiser in the Last Year: Not on file  . Ran Out of Food in the Last Year: Not on file  Transportation Needs:   . Lack of Transportation (Medical): Not on file  . Lack of Transportation (Non-Medical): Not on file  Physical Activity:   . Days of Exercise per Week: Not on file  . Minutes of Exercise per Session: Not on file  Stress:   . Feeling of Stress : Not on file  Social Connections:   . Frequency of Communication with Friends and Family: Not on file  . Frequency of Social Gatherings with Friends and Family: Not on file  . Attends Religious Services: Not on file  . Active Member of Clubs or Organizations: Not on file  . Attends Archivist Meetings: Not on file  . Marital Status: Not on file  Intimate Partner Violence:   . Fear of Current or Ex-Partner: Not on file  . Emotionally Abused: Not on file  . Physically Abused: Not on file  . Sexually Abused: Not on file   Family History  Problem Relation Age of Onset  . Heart attack Mother   . Emphysema Mother   . Congestive Heart Failure Mother   . Heart attack Father   . Congestive Heart Failure Father   . Asthma Other   . Lung disease Sister   . Lung disease Brother   . Colon  cancer Neg Hx     OBJECTIVE:  Vitals:   03/06/20 0820  BP: 128/83  Pulse: 60  Resp: 15  Temp: 98.4 F (36.9 C)  SpO2: 98%     Physical Exam Vitals and nursing note reviewed.  Constitutional:      General: She is not in acute distress.    Appearance: Normal appearance. She is normal weight. She is not ill-appearing,  toxic-appearing or diaphoretic.  HENT:     Head: Normocephalic.  Cardiovascular:     Rate and Rhythm: Normal rate and regular rhythm.     Pulses: Normal pulses.     Heart sounds: Normal heart sounds. No murmur heard.  No friction rub. No gallop.   Pulmonary:     Effort: Pulmonary effort is normal. No respiratory distress.     Breath sounds: Normal breath sounds. No stridor. No wheezing, rhonchi or rales.  Chest:     Chest wall: No tenderness.  Musculoskeletal:     Lumbar back: Spasms and tenderness present.     Comments: Back:  Patient ambulates from chair to exam table without difficulty.  Inspection: Skin clear and intact without obvious swelling, erythema, or ecchymosis. Warm to the touch  Palpation: Vertebral processes nontender. Tenderness about the bilateral paravertebral muscles  ROM: FROM Strength: 5/5 hip flexion, 5/5 knee extension, 5/5 knee flexion, 5/5 plantar flexion, 5/5 dorsiflexion   Neurological:     Mental Status: She is alert and oriented to person, place, and time.     Cranial Nerves: No cranial nerve deficit.     Sensory: No sensory deficit.     Motor: No weakness.     Coordination: Coordination normal.     Gait: Gait normal.     Deep Tendon Reflexes: Reflexes normal.     LABS:  Results for orders placed or performed during the hospital encounter of 03/06/20 (from the past 24 hour(s))  POCT urinalysis dipstick     Status: None   Collection Time: 03/06/20  8:39 AM  Result Value Ref Range   Color, UA yellow yellow   Clarity, UA clear clear   Glucose, UA negative negative mg/dL   Bilirubin, UA negative negative   Ketones, POC  UA negative negative mg/dL   Spec Grav, UA 1.015 1.010 - 1.025   Blood, UA negative negative   pH, UA 7.0 5.0 - 8.0   Protein Ur, POC negative negative mg/dL   Urobilinogen, UA 0.2 0.2 or 1.0 E.U./dL   Nitrite, UA Negative Negative   Leukocytes, UA Negative Negative     ASSESSMENT & PLAN:  1. Acute bilateral low back pain without sciatica     Meds ordered this encounter  Medications  . predniSONE (DELTASONE) 10 MG tablet    Sig: Take 2 tablets (20 mg total) by mouth daily.    Dispense:  15 tablet    Refill:  0  . cyclobenzaprine (FLEXERIL) 5 MG tablet    Sig: Take 1 tablet (5 mg total) by mouth 3 (three) times daily as needed.    Dispense:  30 tablet    Refill:  0  . dexamethasone (DECADRON) injection 10 mg   Discharge instructions  Rest, ice and heat as needed Ensure adequate ROM as tolerated. Continue Tylenol 500 mg as needed for pain Prescribed prednisone prescribed flexeril  for muscle spasm.  Do not drive or operate heavy machinery while taking this medication Return here or go to ER if you have any new or worsening symptoms such as numbness/tingling of the inner thighs, loss of bladder or bowel control, headache/blurry vision, nausea/vomiting, confusion/altered mental status, dizziness, weakness, passing out, imbalance, etc...    Reviewed expectations re: course of current medical issues. Questions answered. Outlined signs and symptoms indicating need for more acute intervention. Patient verbalized understanding. After Visit Summary given.         Emerson Monte, [place] 03/06/20 [phone]

## 2020-03-06 NOTE — Discharge Instructions (Signed)
  Rest, ice and heat as needed Ensure adequate ROM as tolerated. Continue Tylenol 500 mg as needed for pain Prescribed prednisone prescribed flexeril  for muscle spasm.  Do not drive or operate heavy machinery while taking this medication Return here or go to ER if you have any new or worsening symptoms such as numbness/tingling of the inner thighs, loss of bladder or bowel control, headache/blurry vision, nausea/vomiting, confusion/altered mental status, dizziness, weakness, passing out, imbalance, etc..Marland Kitchen

## 2020-03-08 LAB — URINE CULTURE: Culture: NO GROWTH

## 2020-03-09 ENCOUNTER — Telehealth: Payer: Self-pay | Admitting: Pharmacist

## 2020-03-09 ENCOUNTER — Ambulatory Visit (INDEPENDENT_AMBULATORY_CARE_PROVIDER_SITE_OTHER): Payer: Medicare Other | Admitting: Pharmacist

## 2020-03-09 ENCOUNTER — Other Ambulatory Visit: Payer: Self-pay

## 2020-03-09 ENCOUNTER — Encounter: Payer: Self-pay | Admitting: Pharmacist

## 2020-03-09 DIAGNOSIS — T466X5A Adverse effect of antihyperlipidemic and antiarteriosclerotic drugs, initial encounter: Secondary | ICD-10-CM

## 2020-03-09 DIAGNOSIS — M791 Myalgia, unspecified site: Secondary | ICD-10-CM | POA: Diagnosis not present

## 2020-03-09 DIAGNOSIS — E785 Hyperlipidemia, unspecified: Secondary | ICD-10-CM | POA: Diagnosis not present

## 2020-03-09 DIAGNOSIS — I2510 Atherosclerotic heart disease of native coronary artery without angina pectoris: Secondary | ICD-10-CM | POA: Diagnosis not present

## 2020-03-09 MED ORDER — EVOLOCUMAB 140 MG/ML ~~LOC~~ SOAJ: 1.0000 mL | SUBCUTANEOUS | 1 refills | Status: DC

## 2020-03-09 NOTE — Telephone Encounter (Signed)
2 bottles of clofazimine (~3 month supply) are located in the pharmacy office when patient needs a refill.

## 2020-03-09 NOTE — Patient Instructions (Signed)
It was great meeting you today!  We would like your LDL (bad cholesterol) to be less than 70.  Continue your healthy eating and exercise.  Try to minimize saturated fats and added sugars.  We will start a new medication called Repatha which you will inject once every 2 weeks.  I will complete the prior authorization for you.  If the cost is too high for you, we can try Nexlizet.  Please call with any questions!  Karren Cobble, PharmD, BCACP, [place], [place] [address] [place], [place] [address] Phone: [phone]; Fax: [phone] 03/09/2020 9:04 AM

## 2020-03-09 NOTE — Progress Notes (Addendum)
Patient ID: Katrina Campbell                 DOB: 17-Feb-1956                    MRN: [id number]     HPI: Katrina Campbell is a 64 y.o. female patient referred to lipid clinic by [facility]. PMH is significant for anxiety, CAD, COPD, depression, HLD, HTN, and a pulmonary mycobacterial infection in Jun 06, 2018.  Last seen by Dr Meda Coffee on 02/13/20.  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  Still has cough, and dyspnea on exertion.  Due to obstructive CAD, patient has been on Crestor 10 mg daily which was discontinued due to joint pain.  Patient presents today in good spirits.  Has recently stopped her antibiotics on the advice of an infectious disease specialist in [place] who thinks her lung issues may be related more to emphysema than an infection.  Patient quit smoking in 2012-06-06.  Her daughter passed away over a year ago and she reports she began drinking and having trouble sleeping.  This coincided with her lung issues.      Patients LDL 76 on 02/10/20 while on rosuvstatin 10 mg daily.  However patient began developing leg pain that was interfering with her daily life.  She has since discontinued Crestor and leg pain has gone away.  Last coronary calcium score 261, 90 percentile for age and sex.  Also showed mild non obstructive CAD.  Exercises daily on her treadmill for about 20 minutes or more.  Diet is overall good.  Uses olive oil for cooking.  Tries not to eat much fried food.  Current Medications: N/a Intolerances: rosuvastatin 48m daily, rosuvastatin 170m3 times a week (myalgias) Risk Factors: CAD, HTN, HLD, smoking history LDL goal: <70   Exercise: 20 minutes on a treadmill daily  Family History: Mother/Father  - CHF  Social History: Quit drinking ~1 year ago.    Labs:TC 151, HDL 58, Trigs 89, LDL 76 (02/10/20 on rosuvastatin 10)   Coronary calcium score of 261. This was 9046ercentile for age and sex matched control. Mild non-obstructive CAD (25-49%) in the distal left main and proximal portions of LAD and LCX  arteries.    Past Medical History:  Diagnosis Date  . Anxiety   . COPD (chronic obstructive pulmonary disease) ([place]in  . Dental staining 07/15/2019  . Depression   . Hyperlipidemia   . Labile hypertension   . Pulmonary mycobacterial infection (HCCorrigan7/22/2020    Current Outpatient Medications on File Prior to Visit  Medication Sig Dispense Refill  . albuterol (PROAIR HFA) 108 (90 Base) MCG/ACT inhaler Inhale 2 puffs into the lungs every 6 (six) hours as needed for wheezing or shortness of breath. 24 g 0  . albuterol (PROVENTIL) (2.5 MG/3ML) 0.083% nebulizer solution Take 3 mLs (2.5 mg total) by nebulization every 6 (six) hours as needed for wheezing or shortness of breath. 225 mL 12  . aspirin EC 81 MG tablet Take 81 mg by mouth daily with lunch.     . Marland Kitchenzithromycin (ZITHROMAX) 250 MG tablet Take 1 tablet (250 mg total) by mouth daily. 90 tablet 2  . buPROPion (WELLBUTRIN XL) 300 MG 24 hr tablet TAKE 1 TABLET DAILY 90 tablet 3  . Calcium Carb-Cholecalciferol (CALCIUM-VITAMIN D) 500-400 MG-UNIT TABS 1 a day 60 tablet   . cyclobenzaprine (FLEXERIL) 5 MG tablet Take 1 tablet (5 mg total) by mouth 3 (three) times daily as needed. 30 tablet 0  .  ethambutol (MYAMBUTOL) 100 MG tablet Take 1 tablet (100 mg total) by mouth daily. Take with 2 424m tabs for 908mtotal dose 90 tablet 3  . ethambutol (MYAMBUTOL) 400 MG tablet Take 2 tablets (800 mg total) by mouth daily. Take with 10027mablet of ethambutol for 900m61mtal 180 tablet 3  . fluticasone (FLONASE) 50 MCG/ACT nasal spray Place 2 sprays into both nostrils as needed for allergies or rhinitis.     . furosemide (LASIX) 20 MG tablet Take 1 tablet (20 mg total) by mouth daily as needed for edema (Swelling). 30 tablet 6  . guaiFENesin (MUCINEX) 600 MG 12 hr tablet Take 600 mg by mouth 2 (two) times daily.     . Magnesium 500 MG TABS Take 500 mg by mouth daily with lunch.    . metoprolol tartrate (LOPRESSOR) 100 MG tablet Take 1 tablet (100 mg  total) by mouth once for 1 dose. Take 2 hours prior to your Coronary CT. 1 tablet 0  . predniSONE (DELTASONE) 10 MG tablet Take 2 tablets (20 mg total) by mouth daily. 15 tablet 0  . Respiratory Therapy Supplies (FLUTTER) DEVI Use as directed 1 each 0  . rifampin (RIFADIN) 300 MG capsule Take 2 capsules (600 mg total) by mouth daily. 180 capsule 3  . temazepam (RESTORIL) 15 MG capsule TAKE 1 CAPSULE AT BEDTIME AS NEEDED FOR SLEEP 30 capsule 0  . umeclidinium-vilanterol (ANORO ELLIPTA) 62.5-25 MCG/INH AEPB Inhale 1 puff into the lungs daily. 180 each 3  . vitamin C (ASCORBIC ACID) 500 MG tablet Take 500 mg by mouth daily.      No current facility-administered medications on file prior to visit.    No Known Allergies  Assessment/Plan:  1. Hyperlipidemia - Patient LDL 76 which is above goal of <70.  Discussed importance of diet and exercise with patient and importance of reducing bad cholesterol in setting of CAD.  Gave patient non statin medication options such as Nexlizet and Repatha.  Patient prefers to try Repatha due to greater efficacy and that it is only once every 2 weeks.  Using RepaBurlington Northern Santa Feucated patient on storage, site selection, and administration.  Patient was able to demonstrate in room.  Will complete PA and scheduled lipi panel for February.  Patient concerned about copay however and will call back if unaffordable.  Consider Nexlizet at that time.  Did not take antibiotics off of patient's med list because she may restart them at some point.  ChriKarren CobblearmD, BCACP, CDCESparksP [id number] [id number]: [phone]: ([id number]/2021 9:33 AM

## 2020-03-11 ENCOUNTER — Ambulatory Visit (INDEPENDENT_AMBULATORY_CARE_PROVIDER_SITE_OTHER): Payer: Medicare Other | Admitting: Family Medicine

## 2020-03-11 ENCOUNTER — Ambulatory Visit (HOSPITAL_COMMUNITY): Payer: Medicare Other | Attending: Cardiology

## 2020-03-11 ENCOUNTER — Other Ambulatory Visit: Payer: Self-pay

## 2020-03-11 ENCOUNTER — Encounter: Payer: Self-pay | Admitting: Family Medicine

## 2020-03-11 VITALS — BP 132/68 | HR 81 | Temp 98.5°F | Ht 64.0 in | Wt 134.0 lb

## 2020-03-11 DIAGNOSIS — R072 Precordial pain: Secondary | ICD-10-CM | POA: Insufficient documentation

## 2020-03-11 DIAGNOSIS — R0600 Dyspnea, unspecified: Secondary | ICD-10-CM | POA: Diagnosis not present

## 2020-03-11 DIAGNOSIS — F5104 Psychophysiologic insomnia: Secondary | ICD-10-CM

## 2020-03-11 DIAGNOSIS — I2510 Atherosclerotic heart disease of native coronary artery without angina pectoris: Secondary | ICD-10-CM | POA: Diagnosis not present

## 2020-03-11 DIAGNOSIS — E785 Hyperlipidemia, unspecified: Secondary | ICD-10-CM

## 2020-03-11 DIAGNOSIS — M545 Low back pain, unspecified: Secondary | ICD-10-CM | POA: Diagnosis not present

## 2020-03-11 DIAGNOSIS — F320 Major depressive disorder, single episode, mild: Secondary | ICD-10-CM | POA: Diagnosis not present

## 2020-03-11 DIAGNOSIS — R0989 Other specified symptoms and signs involving the circulatory and respiratory systems: Secondary | ICD-10-CM | POA: Diagnosis not present

## 2020-03-11 DIAGNOSIS — M25552 Pain in left hip: Secondary | ICD-10-CM

## 2020-03-11 DIAGNOSIS — R0609 Other forms of dyspnea: Secondary | ICD-10-CM

## 2020-03-11 LAB — ECHOCARDIOGRAM COMPLETE
Area-P 1/2: 2.07 cm2
S' Lateral: 2.8 cm

## 2020-03-11 MED ORDER — BUPROPION HCL ER (XL) 300 MG PO TB24
300.0000 mg | ORAL_TABLET | Freq: Every day | ORAL | 3 refills | Status: DC
Start: 2020-03-11 — End: 2021-04-07

## 2020-03-11 MED ORDER — TEMAZEPAM 15 MG PO CAPS
ORAL_CAPSULE | ORAL | 0 refills | Status: DC
Start: 2020-03-11 — End: 2020-07-02

## 2020-03-11 MED ORDER — TRAMADOL HCL 50 MG PO TABS
50.0000 mg | ORAL_TABLET | Freq: Three times a day (TID) | ORAL | 0 refills | Status: AC | PRN
Start: 2020-03-11 — End: 2020-03-16

## 2020-03-11 NOTE — Progress Notes (Signed)
   Subjective:    Patient ID: Katrina Campbell, female    DOB: Jul 17, 1955, 64 y.o.   MRN: [id number]  Patient presents for left hip and side pain (a week and a day )   Pt here with left hip pain for the past week  she had a fall on her treadmill about 3 weeks ago She had hematoma on left lower leg  She did not have pain in hip  No back pain  Seen at [facility] on 12/2 given  She has urinary frequency , UA negative   Given steroids    She is still on treatment for MAC, she was antibiotics for 1 year   Chronic insomnia/depression -she is on wellbutrin and restoril for sleep   Meds reviewed   She is followed by pulmonary , cardiology    Recent labs reviewed  Review Of Systems:  GEN- denies fatigue, fever, weight loss,weakness, recent illness HEENT- denies eye drainage, change in vision, nasal discharge, CVS- denies chest pain, palpitations RESP- denies SOB, cough, wheeze ABD- denies N/V, change in stools, abd pain GU- denies dysuria, hematuria, dribbling, incontinence MSK- + joint pain, muscle aches, injury Neuro- denies headache, dizziness, syncope, seizure activity       Objective:    BP 132/68   Pulse 81   Temp 98.5 F (36.9 C) (Oral)   Ht _0  (1.626 m)   Wt 134 lb (60.8 kg)   SpO2 96%   BMI 23.00 kg/m  GEN- NAD, alert and oriented x3 CVS- RRR, no murmur RESP-CTAB ABD-NABS,soft,NT,ND MSK- TTP left paraspinals, spine NT, GOOD rom, MILD ttp LEFT HIP, FAIR rom BILAT, from KNEES , ANTALGIC GAIT, MILD LIMP Neuro-cnii-xii IN TACT SENSATION in tact LE, strength in tact Psych normal affect and mood EXT- No edema Pulses- Radial, DP- 2+        Assessment & Plan:      Problem List Items Addressed This Visit      Unprioritized   Chronic insomnia   Depression    Continue wellbutrin restoril for sleep      Relevant Medications   buPROPion (WELLBUTRIN XL) 300 MG 24 hr tablet    Other Visit Diagnoses    Left hip pain    -  Primary   obtain xray of back and hip,  given ultram, does not tolerate steroids or muscle relaxers   Acute left-sided low back pain without sciatica       Relevant Medications   traMADol (ULTRAM) 50 MG tablet      Note: This dictation was prepared with Dragon dictation along with smaller phrase technology. Any transcriptional errors that result from this process are unintentional.

## 2020-03-11 NOTE — Assessment & Plan Note (Signed)
Continue wellbutrin restoril for sleep

## 2020-03-11 NOTE — Patient Instructions (Addendum)
Take tramadol for pain Get xrays in the morning   [facility]   [place] [address]

## 2020-03-12 ENCOUNTER — Ambulatory Visit
Admission: RE | Admit: 2020-03-12 | Discharge: 2020-03-12 | Disposition: A | Discharge status: Home / Self Care | Payer: Medicare Other | Source: Ambulatory Visit | Attending: Family Medicine | Admitting: Family Medicine

## 2020-03-12 DIAGNOSIS — M25552 Pain in left hip: Secondary | ICD-10-CM

## 2020-03-12 DIAGNOSIS — M545 Low back pain, unspecified: Secondary | ICD-10-CM

## 2020-03-13 NOTE — Progress Notes (Signed)
Spoke with pt regarding xray results. Verbalized understanding.

## 2020-03-16 ENCOUNTER — Ambulatory Visit: Payer: Medicare Other | Admitting: Pulmonary Disease

## 2020-04-01 ENCOUNTER — Ambulatory Visit: Payer: Medicare Other | Admitting: Pulmonary Disease

## 2020-04-04 LAB — MYCOBACTERIA,CULT W/FLUOROCHROME SMEAR
MICRO NUMBER:: 11218154
SMEAR:: NONE SEEN
SPECIMEN QUALITY:: ADEQUATE

## 2020-04-10 ENCOUNTER — Telehealth: Payer: Self-pay | Admitting: Pharmacist

## 2020-04-10 NOTE — Telephone Encounter (Signed)
PA for Repatha denied.  Will write letter for appeal and patient is agreeable.  Patient needs to sign authorization form.  Will send patient form through mail and she will send back.

## 2020-04-14 ENCOUNTER — Other Ambulatory Visit: Payer: Self-pay

## 2020-04-14 ENCOUNTER — Encounter: Payer: Self-pay | Admitting: Pulmonary Disease

## 2020-04-14 ENCOUNTER — Ambulatory Visit (INDEPENDENT_AMBULATORY_CARE_PROVIDER_SITE_OTHER): Payer: Medicare Other | Admitting: Pulmonary Disease

## 2020-04-14 VITALS — BP 124/70 | HR 63 | Temp 97.9°F | Ht 64.0 in | Wt 131.0 lb

## 2020-04-14 DIAGNOSIS — Z23 Encounter for immunization: Secondary | ICD-10-CM

## 2020-04-14 DIAGNOSIS — J479 Bronchiectasis, uncomplicated: Secondary | ICD-10-CM | POA: Diagnosis not present

## 2020-04-14 DIAGNOSIS — R911 Solitary pulmonary nodule: Secondary | ICD-10-CM | POA: Diagnosis not present

## 2020-04-14 DIAGNOSIS — J449 Chronic obstructive pulmonary disease, unspecified: Secondary | ICD-10-CM

## 2020-04-14 DIAGNOSIS — J432 Centrilobular emphysema: Secondary | ICD-10-CM

## 2020-04-14 MED ORDER — ALBUTEROL SULFATE (2.5 MG/3ML) 0.083% IN NEBU: 2.5000 mg | INHALATION_SOLUTION | Freq: Four times a day (QID) | RESPIRATORY_TRACT | 3 refills | Status: DC | PRN

## 2020-04-14 NOTE — Patient Instructions (Addendum)
Thank you for visiting Dr. Valeta Harms at [facility]. Today we recommend the following:  Flu shot today  Continue current inhaler regimen.   Follow up LDCT nodule scan March 2022  Meds ordered this encounter  Medications  . albuterol (PROVENTIL) (2.5 MG/3ML) 0.083% nebulizer solution    Sig: Take 3 mLs (2.5 mg total) by nebulization every 6 (six) hours as needed for wheezing or shortness of breath.    Dispense:  540 mL    Refill:  3   Return in about 1 year (around 04/14/2021), or if symptoms worsen or fail to improve.    Please do your part to reduce the spread of COVID-19.

## 2020-04-14 NOTE — Progress Notes (Signed)
Synopsis: Referred in January 2021 for former smoker quit 2014 moderate COPD, MAI by Alycia Rossetti, MD  Subjective:   PATIENT ID: Katrina Campbell GENDER: female DOB: May 31, 1955, MRN: [id number]  Chief Complaint  Patient presents with  . Follow-up    Refill of the nebulizer meds to mail order.  2 negative cultures and has been off her abx.      [name] moderate COPD, former smoker quit 2014, MAI patient last seen in the office by Dr. Vaughan Browner.  Former Dr. Lenna Gilford patient.  Treated for stenotrophomonas in the past.  Prior 3 sputum's AFB, 1/3+ for MAI.  Saw infectious disease to discuss initiation of therapy.  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  Patient was last seen by infectious disease on 04/15/2019.  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  Just started on azithromycin 500 mg daily, rifampin 600 mg daily plus ethambutol 900 mg daily.  Patient does have CT imaging with lower lobe bronchiectasis.  Patient has chronic sputum production.  She is short of breath with daily cough and sputum production.  During this time she is very anxious and hesitant about going out in public due to her chronic cough.  She denies hemoptysis patient's weight has also been stable.  OV 11/06/2019: around the 4th of July patient had congestion and cough that still hasnt really gone away.  Overall doing much better now.  Has less cough and congestion.  Recent follow-up with infectious disease plan for Monday.  Been compliant with her medications.  Still has some daily sputum production.  Rarely has a streak of blood with cough.  OV 04/14/2020: Here today for follow-up.  Establish care with me back last year.  Was already under treatment for her MAI.  Followed up with infectious disease here in [place] as well as met with ID at [facility].  Decision  was made to come off of treatments.  Recent sputum's have been negative.  She does have significant emphysema currently compliant with inhaler regimen.  From a respiratory standpoint she is doing fine.  No significant symptom change after stopping antibiotics.    Past Medical History:  Diagnosis Date  . Anxiety   . COPD (chronic obstructive pulmonary disease) ([place])   . Dental staining 07/15/2019  . Depression   . Hyperlipidemia   . Labile hypertension   . Pulmonary mycobacterial infection ([place]) 10/24/2018     Family History  Problem Relation Age of Onset  . Heart attack Mother   . Emphysema Mother   . Congestive Heart Failure Mother   . Heart attack Father   . Congestive Heart Failure Father   . Asthma Other   . Lung disease Sister   . Lung disease Brother   . Colon cancer Neg Hx      Past Surgical History:  Procedure Laterality Date  . LEFT HEART CATH AND CORONARY ANGIOGRAPHY N/A 11/07/2017   Procedure: LEFT HEART CATH AND CORONARY ANGIOGRAPHY;  Surgeon: Nelva Bush, MD;  Location: [facility];  Service: Cardiovascular;  Laterality: N/A;  . NASAL SINUS SURGERY  1986  . VIDEO BRONCHOSCOPY Bilateral 01/29/2019   Procedure: VIDEO BRONCHOSCOPY WITH FLUORO;  Surgeon: Marshell Garfinkel, MD;  Location: [facility];  Service: Cardiopulmonary;  Laterality: Bilateral;    Social History   Socioeconomic History  . Marital status: Married    Spouse name: Not on  file  . Number of children: Not on file  . Years of education: Not on file  . Highest education level: Not on file  Occupational History  . Not on file  Tobacco Use  . Smoking status: Former Smoker    Packs/day: 1.75    Years: 44.00    Pack years: 77.00    Types: Cigarettes    Quit date: 11/09/2012    Years since quitting: 7.4  . Smokeless tobacco: Never Used  . Tobacco comment: vapor  Vaping Use  . Vaping Use: Former  Substance and Sexual Activity  . Alcohol use: Yes    Alcohol/week: 24.0 standard drinks     Types: 24 Cans of beer per week  . Drug use: No  . Sexual activity: Not Currently  Other Topics Concern  . Not on file  Social History Narrative  . Not on file   Social Determinants of Health   Financial Resource Strain: Not on file  Food Insecurity: Not on file  Transportation Needs: Not on file  Physical Activity: Not on file  Stress: Not on file  Social Connections: Not on file  Intimate Partner Violence: Not on file     Allergies  Allergen Reactions  . Crestor [Rosuvastatin]     myalgia     Outpatient Medications Prior to Visit  Medication Sig Dispense Refill  . albuterol (PROAIR HFA) 108 (90 Base) MCG/ACT inhaler Inhale 2 puffs into the lungs every 6 (six) hours as needed for wheezing or shortness of breath. 24 g 0  . albuterol (PROVENTIL) (2.5 MG/3ML) 0.083% nebulizer solution Take 3 mLs (2.5 mg total) by nebulization every 6 (six) hours as needed for wheezing or shortness of breath. 225 mL 12  . aspirin EC 81 MG tablet Take 81 mg by mouth daily with lunch.     Marland Kitchen buPROPion (WELLBUTRIN XL) 300 MG 24 hr tablet Take 1 tablet (300 mg total) by mouth daily. 90 tablet 3  . Calcium Carb-Cholecalciferol (CALCIUM-VITAMIN D) 500-400 MG-UNIT TABS 1 a day 60 tablet   . Coenzyme Q-10 200 MG CAPS Take 200 mg by mouth daily.    . fluticasone (FLONASE) 50 MCG/ACT nasal spray Place 2 sprays into both nostrils as needed for allergies or rhinitis.     Marland Kitchen guaiFENesin (MUCINEX) 600 MG 12 hr tablet Take 600 mg by mouth 2 (two) times daily as needed for to loosen phlegm. Patient takes every other day    . Magnesium 500 MG TABS Take 500 mg by mouth daily with lunch.    . Respiratory Therapy Supplies (FLUTTER) DEVI Use as directed 1 each 0  . temazepam (RESTORIL) 15 MG capsule TAKE 1 CAPSULE AT BEDTIME AS NEEDED FOR SLEEP 90 capsule 0  . umeclidinium-vilanterol (ANORO ELLIPTA) 62.5-25 MCG/INH AEPB Inhale 1 puff into the lungs daily. 180 each 3  . ethambutol (MYAMBUTOL) 100 MG tablet Take 1  tablet (100 mg total) by mouth daily. Take with 2 496m tabs for 9077mtotal dose (Patient not taking: No sig reported) 90 tablet 3  . ethambutol (MYAMBUTOL) 400 MG tablet Take 2 tablets (800 mg total) by mouth daily. Take with 10024mablet of ethambutol for 900m51mtal (Patient not taking: No sig reported) 180 tablet 3  . Evolocumab 140 MG/ML SOAJ Inject 1 mL into the skin every 14 (fourteen) days. (Patient not taking: No sig reported) 6 mL 1  . rifampin (RIFADIN) 300 MG capsule Take 2 capsules (600 mg total) by mouth daily. (Patient not taking: No  sig reported) 180 capsule 3  . azithromycin (ZITHROMAX) 250 MG tablet Take 1 tablet (250 mg total) by mouth daily. (Patient not taking: No sig reported) 90 tablet 2  . furosemide (LASIX) 20 MG tablet Take 1 tablet (20 mg total) by mouth daily as needed for edema (Swelling). (Patient not taking: No sig reported) 30 tablet 6   No facility-administered medications prior to visit.    Review of Systems  Constitutional: Negative for chills, fever, malaise/fatigue and weight loss.  HENT: Negative for hearing loss, sore throat and tinnitus.   Eyes: Negative for blurred vision and double vision.  Respiratory: Positive for cough, sputum production and shortness of breath. Negative for hemoptysis, wheezing and stridor.   Cardiovascular: Negative for chest pain, palpitations, orthopnea, leg swelling and PND.  Gastrointestinal: Negative for abdominal pain, constipation, diarrhea, heartburn, nausea and vomiting.  Genitourinary: Negative for dysuria, hematuria and urgency.  Musculoskeletal: Negative for joint pain and myalgias.  Skin: Negative for itching and rash.  Neurological: Negative for dizziness, tingling, weakness and headaches.  Endo/Heme/Allergies: Negative for environmental allergies. Does not bruise/bleed easily.  Psychiatric/Behavioral: Negative for depression. The patient is not nervous/anxious and does not have insomnia.   All other systems  reviewed and are negative.    Objective:  Physical Exam Vitals reviewed.  Constitutional:      General: She is not in acute distress.    Appearance: She is well-developed.  HENT:     Head: Normocephalic and atraumatic.     Mouth/Throat:     Mouth: Oropharynx is clear and moist.     Pharynx: No oropharyngeal exudate.  Eyes:     Extraocular Movements: EOM normal.     Conjunctiva/sclera: Conjunctivae normal.     Pupils: Pupils are equal, round, and reactive to light.  Neck:     Vascular: No JVD.     Trachea: No tracheal deviation.  Cardiovascular:     Rate and Rhythm: Normal rate and regular rhythm.     Pulses: Intact distal pulses.     Heart sounds: S1 normal and S2 normal.     Comments: Distant heart tones Pulmonary:     Effort: No tachypnea or accessory muscle usage.     Breath sounds: No stridor. Decreased breath sounds (throughout all lung fields) present. No wheezing, rhonchi or rales.     Comments: Increased AP chest diameter Abdominal:     General: Bowel sounds are normal. There is no distension.     Palpations: Abdomen is soft.     Tenderness: There is no abdominal tenderness.  Musculoskeletal:        General: No deformity or edema.  Skin:    General: Skin is warm and dry.     Capillary Refill: Capillary refill takes less than 2 seconds.     Findings: No rash.  Neurological:     Mental Status: She is alert and oriented to person, place, and time.  Psychiatric:        Mood and Affect: Mood and affect normal.        Behavior: Behavior normal.      Vitals:   04/14/20 1123  BP: 124/70  Pulse: 63  Temp: 97.9 F (36.6 C)  TempSrc: Tympanic  SpO2: 97%  Weight: 131 lb (59.4 kg)  Height: _0  (1.626 m)   97% on RA BMI Readings from Last 3 Encounters:  04/14/20 22.49 kg/m  03/11/20 23.00 kg/m  02/18/20 22.83 kg/m   Wt Readings from Last 3 Encounters:  04/14/20  131 lb (59.4 kg)  03/11/20 134 lb (60.8 kg)  02/18/20 133 lb (60.3 kg)     CBC     Component Value Date/Time   WBC 4.9 02/10/2020 0813   WBC 5.4 05/29/2018 1232   RBC 4.60 02/10/2020 0813   RBC 4.78 05/29/2018 1232   HGB 13.8 02/10/2020 0813   HCT 39.9 02/10/2020 0813   PLT 148 (L) 02/10/2020 0813   MCV 87 02/10/2020 0813   MCH 30.0 02/10/2020 0813   MCH 29.1 05/29/2018 1232   MCHC 34.6 02/10/2020 0813   MCHC 31.7 05/29/2018 1232   RDW 13.1 02/10/2020 0813   LYMPHSABS 2.1 01/02/2019 1005   MONOABS 0.5 11/06/2017 2214   EOSABS 0.1 01/02/2019 1005   BASOSABS 0.0 01/02/2019 1005    Chest Imaging:  May 2020 CT chest: Scattered nodules.  Lower lobe small areas of bronchiectasis. The patient's images have been independently reviewed by me.    Pulmonary Functions Testing Results: No flowsheet data found.  FeNO: none   Pathology: none   Echocardiogram: none  Heart Catheterization: none     Assessment & Plan:     ICD-10-CM   1. Nodule of upper lobe of right lung  R91.1   2. Stage 2 moderate COPD by GOLD classification (HCC)  J44.9 albuterol (PROVENTIL) (2.5 MG/3ML) 0.083% nebulizer solution  3. Bronchiectasis without complication ([place])  Z60.1   4. Centrilobular emphysema (HCC)  J43.2     Discussion:  65 year old female, moderate COPD, FEV1 66% predicted longstanding history of smoking CT imaging with evidence of emphysema.  Currently managed with LAMA/LABA.  Off ICS due to MAC history.  Recently stopped antibiotic regimen for MAC.  Doing well at this time with flutter valve airway clearance techniques occasional use of vest therapy.  Plan: Continue conservative measures with flutter valve vest for airway clearance Continue bronchodilators for COPD management. Routine follow-up scheduled for patient's lung cancer screening CT. She will follow-up with [facility] gross in a few months with her repeat.  Ensure new nodule stability.     Current Outpatient Medications:  .  albuterol (PROAIR HFA) 108 (90 Base) MCG/ACT inhaler, Inhale 2 puffs into the lungs  every 6 (six) hours as needed for wheezing or shortness of breath., Disp: 24 g, Rfl: 0 .  albuterol (PROVENTIL) (2.5 MG/3ML) 0.083% nebulizer solution, Take 3 mLs (2.5 mg total) by nebulization every 6 (six) hours as needed for wheezing or shortness of breath., Disp: 225 mL, Rfl: 12 .  aspirin EC 81 MG tablet, Take 81 mg by mouth daily with lunch. , Disp: , Rfl:  .  buPROPion (WELLBUTRIN XL) 300 MG 24 hr tablet, Take 1 tablet (300 mg total) by mouth daily., Disp: 90 tablet, Rfl: 3 .  Calcium Carb-Cholecalciferol (CALCIUM-VITAMIN D) 500-400 MG-UNIT TABS, 1 a day, Disp: 60 tablet, Rfl:  .  Coenzyme Q-10 200 MG CAPS, Take 200 mg by mouth daily., Disp: , Rfl:  .  fluticasone (FLONASE) 50 MCG/ACT nasal spray, Place 2 sprays into both nostrils as needed for allergies or rhinitis. , Disp: , Rfl:  .  guaiFENesin (MUCINEX) 600 MG 12 hr tablet, Take 600 mg by mouth 2 (two) times daily as needed for to loosen phlegm. Patient takes every other day, Disp: , Rfl:  .  Magnesium 500 MG TABS, Take 500 mg by mouth daily with lunch., Disp: , Rfl:  .  Respiratory Therapy Supplies (FLUTTER) DEVI, Use as directed, Disp: 1 each, Rfl: 0 .  temazepam (RESTORIL) 15 MG capsule,  TAKE 1 CAPSULE AT BEDTIME AS NEEDED FOR SLEEP, Disp: 90 capsule, Rfl: 0 .  umeclidinium-vilanterol (ANORO ELLIPTA) 62.5-25 MCG/INH AEPB, Inhale 1 puff into the lungs daily., Disp: 180 each, Rfl: 3 .  ethambutol (MYAMBUTOL) 100 MG tablet, Take 1 tablet (100 mg total) by mouth daily. Take with 2 49m tabs for 9074mtotal dose (Patient not taking: No sig reported), Disp: 90 tablet, Rfl: 3 .  ethambutol (MYAMBUTOL) 400 MG tablet, Take 2 tablets (800 mg total) by mouth daily. Take with 10043mablet of ethambutol for 900m12mtal (Patient not taking: No sig reported), Disp: 180 tablet, Rfl: 3 .  Evolocumab 140 MG/ML SOAJ, Inject 1 mL into the skin every 14 (fourteen) days. (Patient not taking: No sig reported), Disp: 6 mL, Rfl: 1 .  rifampin (RIFADIN) 300 MG  capsule, Take 2 capsules (600 mg total) by mouth daily. (Patient not taking: No sig reported), Disp: 180 capsule, Rfl: 3   I spent 31 minutes dedicated to the care of this patient on the date of this encounter to include pre-visit review of records, face-to-face time with the patient discussing conditions above, post visit ordering of testing, clinical documentation with the electronic health record, making appropriate referrals as documented, and communicating necessary findings to members of the patients care team.    BradGarner [facility] 04/14/2020 11:38 AM

## 2020-04-16 ENCOUNTER — Telehealth: Payer: Self-pay | Admitting: Pharmacist

## 2020-04-16 DIAGNOSIS — E785 Hyperlipidemia, unspecified: Secondary | ICD-10-CM

## 2020-04-16 DIAGNOSIS — I2510 Atherosclerotic heart disease of native coronary artery without angina pectoris: Secondary | ICD-10-CM

## 2020-04-16 MED ORDER — EZETIMIBE 10 MG PO TABS: 10.0000 mg | ORAL_TABLET | Freq: Every day | ORAL | 1 refills | Status: DC

## 2020-04-16 NOTE — Telephone Encounter (Signed)
Patient called, received her appeal letter in the mail.  However she is worried about how long the process may take and that has caused her stress and anxiety. No longer wants to go through with process and would like to try a different therapy.  Intolerant to statins but has not tried Zetia. Explained mechanism of action was different for Zetia than Crestor and patient is willing to try.  Patient requests 90 day supply.

## 2020-05-14 ENCOUNTER — Other Ambulatory Visit: Payer: Medicare Other

## 2020-05-14 ENCOUNTER — Other Ambulatory Visit: Payer: Self-pay

## 2020-05-14 DIAGNOSIS — E785 Hyperlipidemia, unspecified: Secondary | ICD-10-CM

## 2020-05-14 LAB — LIPID PANEL
Chol/HDL Ratio: 4.1 ratio (ref 0.0–4.4)
Cholesterol, Total: 192 mg/dL (ref 100–199)
HDL: 47 mg/dL (ref >39)
LDL Chol Calc (NIH): 113 mg/dL — ABNORMAL HIGH (ref 0–99)
Triglycerides: 184 mg/dL — ABNORMAL HIGH (ref 0–149)
VLDL Cholesterol Cal: 32 mg/dL (ref 5–40)

## 2020-05-15 MED ORDER — REPATHA SURECLICK 140 MG/ML ~~LOC~~ SOAJ: 1.0000 "pen " | SUBCUTANEOUS | 3 refills | Status: DC

## 2020-05-15 NOTE — Telephone Encounter (Signed)
PA for Repatha approved through 05/15/21. Called pt and made her aware. She would like to try Repatha. RX sent to [facility]. She will call us when it comes in and she will come to the office for teaching

## 2020-05-21 ENCOUNTER — Other Ambulatory Visit: Payer: Self-pay

## 2020-05-21 ENCOUNTER — Encounter: Payer: Self-pay | Admitting: Cardiology

## 2020-05-21 ENCOUNTER — Ambulatory Visit (INDEPENDENT_AMBULATORY_CARE_PROVIDER_SITE_OTHER): Payer: Medicare Other | Admitting: Cardiology

## 2020-05-21 ENCOUNTER — Other Ambulatory Visit: Payer: Self-pay | Admitting: Cardiology

## 2020-05-21 ENCOUNTER — Telehealth: Payer: Self-pay | Admitting: Pharmacist

## 2020-05-21 VITALS — BP 142/90 | HR 73 | Ht 64.0 in | Wt 133.0 lb

## 2020-05-21 DIAGNOSIS — R600 Localized edema: Secondary | ICD-10-CM | POA: Diagnosis not present

## 2020-05-21 DIAGNOSIS — E785 Hyperlipidemia, unspecified: Secondary | ICD-10-CM

## 2020-05-21 DIAGNOSIS — E782 Mixed hyperlipidemia: Secondary | ICD-10-CM | POA: Diagnosis not present

## 2020-05-21 DIAGNOSIS — I2510 Atherosclerotic heart disease of native coronary artery without angina pectoris: Secondary | ICD-10-CM | POA: Diagnosis not present

## 2020-05-21 MED ORDER — ATORVASTATIN CALCIUM 10 MG PO TABS: 10.0000 mg | ORAL_TABLET | Freq: Every day | ORAL | 0 refills | Status: DC

## 2020-05-21 NOTE — Telephone Encounter (Signed)
In room consult during appt with Dr Meda Coffee.  Patient nervous about starting Repatha and Crestor gave muscle cramps.  Remains on Zetia 10mg  however LDL now above goal.  Discussed next options with patient including Nexlizet and perhaps a different statin such as atorvastatin since she had not taken that before.  Patient is agreeable to starting atorvastatin for 30 days and will call back if she has cramps.

## 2020-05-21 NOTE — Patient Instructions (Addendum)
Medication Instructions: Medication Instructions:  NO CHANGES *If you need a refill on your cardiac medications before your next appointment, please call your pharmacy*   Lab Work: NONE If you have labs (blood work) drawn today and your tests are completely normal, you will receive your results only by: Marland Kitchen MyChart Message (if you have MyChart) OR . A paper copy in the mail If you have any lab test that is abnormal or we need to change your treatment, we will call you to review the results.   Testing/Procedures: NONE   Follow-Up: At [facility], you and your health needs are our priority.  As part of our continuing mission to provide you with exceptional heart care, we have created designated Provider Care Teams.  These Care Teams include your primary Cardiologist (physician) and Advanced Practice Providers (APPs -  Physician Assistants and Nurse Practitioners) who all work together to provide you with the care you need, when you need it.  We recommend signing up for the patient portal called "MyChart".  Sign up information is provided on this After Visit Summary.  MyChart is used to connect with patients for Virtual Visits (Telemedicine).  Patients are able to view lab/test results, encounter notes, upcoming appointments, etc.  Non-urgent messages can be sent to your provider as well.   To learn more about what you can do with MyChart, go to [email or web address].    Your next appointment:   6 month(s)  The format for your next appointment:   In Person  Provider:   Gwyndolyn Kaufman, MD   Other Instructions NONE

## 2020-05-21 NOTE — Progress Notes (Signed)
Cardiology Office Note:    Date:  05/21/2020   ID:  Katrina Campbell, DOB [birth year], MRN [id number]  PCP:  Alycia Rossetti, MD  Cardiologist:  Ena Dawley, MD  Referring MD: Alycia Rossetti, MD   Reason for visit: Follow up   History of Present Illness:    Katrina Campbell is a 65 y.o. female with a past medical history significant for coronary calcification and mildly dilated thoracic aortic aneurysm on CT.  Nuclear stress test, 06/2016,  with excellent functional capacity, no ischemia and normal LVEF.  She also has hyperlipidemia, hypertension, depression and COPD.  02/13/2020 - She was admitted to the hospital on 11/06/2017 with complaints of sudden onset of chest pain and dyspnea approximately 10 minutes after eating a carrot.  She was given nitroglycerin sublingually and apparently had about 30 seconds of syncope afterward.  Her systolic blood pressure dropped from 158 to 60.  She had a mild troponin elevation of 0.07 and a flat trend.  She underwent left heart cath on 11/07/2017 that showed mild nonobstructive CAD with 20% stenosis of her LAD and normal LV function.  She is being treated with risk factor modification, aspirin and statin.  The patient states that she has been experiencing more dyspnea on exertion as well as chest pain.  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  She continues to walk on a treadmill for 20 minutes every day, at the beginning of her exercise she feels chest pressure but she powers through it and it improves later on.  She denies any diaphoresis, no dizziness or falls.  05/21/2020 -the patient is coming after 3 months, she is completely asymptomatic and very active, denies any chest pain or shortness of breath.  She has been seeing our lipid clinic we were finally able to [place] approved, however patient is hesitant to start taking it and  would like to talk to them again before starting injectable agent.  She denies any lower extremity edema orthopnea proximal nocturnal dyspnea.  Past Medical History:  Diagnosis Date  . Anxiety   . COPD (chronic obstructive pulmonary disease) ([place])   . Dental staining 07/15/2019  . Depression   . Hyperlipidemia   . Labile hypertension   . 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 LEFT HEART CATH AND CORONARY ANGIOGRAPHY N/A 11/07/2017   Procedure: LEFT HEART CATH AND CORONARY ANGIOGRAPHY;  Surgeon: Nelva Bush, MD;  Location: [facility];  Service: Cardiovascular;  Laterality: N/A;  . NASAL SINUS SURGERY  1986  . VIDEO BRONCHOSCOPY Bilateral 01/29/2019   Procedure: VIDEO BRONCHOSCOPY WITH FLUORO;  Surgeon: Marshell Garfinkel, MD;  Location: [facility];  Service: Cardiopulmonary;  Laterality: Bilateral;    Current Medications: Current Meds  Medication Sig  . albuterol (PROAIR HFA) 108 (90 Base) MCG/ACT inhaler Inhale 2 puffs into the lungs every 6 (six) hours as needed for wheezing or shortness of breath.  Marland Kitchen albuterol (PROVENTIL) (2.5 MG/3ML) 0.083% nebulizer solution Take 3 mLs (2.5 mg total) by nebulization every 6 (six) hours as needed for wheezing or shortness of breath.  Marland Kitchen aspirin EC 81 MG tablet Take 81 mg by mouth daily with lunch.   Marland Kitchen buPROPion (WELLBUTRIN XL) 300 MG 24 hr tablet Take 1 tablet (300 mg total) by mouth daily.  . Calcium Carb-Cholecalciferol (CALCIUM-VITAMIN  D) 500-400 MG-UNIT TABS 1 a day  . Coenzyme Q-10 200 MG CAPS Take 200 mg by mouth daily.  Marland Kitchen ezetimibe (ZETIA) 10 MG tablet Take 1 tablet (10 mg total) by mouth daily.  . fluticasone (FLONASE) 50 MCG/ACT nasal spray Place 2 sprays into both nostrils as needed for allergies or rhinitis.   Marland Kitchen guaiFENesin (MUCINEX) 600 MG 12 hr tablet Take 600 mg by mouth 2 (two) times daily as needed for to loosen phlegm. Patient takes every other day  .  Magnesium 500 MG TABS Take 500 mg by mouth daily with lunch.  . Respiratory Therapy Supplies (FLUTTER) DEVI Use as directed  . temazepam (RESTORIL) 15 MG capsule TAKE 1 CAPSULE AT BEDTIME AS NEEDED FOR SLEEP  . umeclidinium-vilanterol (ANORO ELLIPTA) 62.5-25 MCG/INH AEPB Inhale 1 puff into the lungs daily.     Allergies:   Crestor [rosuvastatin]   Social History   Socioeconomic History  . Marital status: Married    Spouse name: Not on file  . Number of children: Not on file  . Years of education: Not on file  . Highest education level: Not on file  Occupational History  . Not on file  Tobacco Use  . Smoking status: Former Smoker    Packs/day: 1.75    Years: 44.00    Pack years: 77.00    Types: Cigarettes    Quit date: 11/09/2012    Years since quitting: 7.5  . Smokeless tobacco: Never Used  . Tobacco comment: vapor  Vaping Use  . Vaping Use: Former  Substance and Sexual Activity  . Alcohol use: Yes    Alcohol/week: 24.0 standard drinks    Types: 24 Cans of beer per week  . Drug use: No  . Sexual activity: Not Currently  Other Topics Concern  . Not on file  Social History Narrative  . Not on file   Social Determinants of Health   Financial Resource Strain: Not on file  Food Insecurity: Not on file  Transportation Needs: Not on file  Physical Activity: Not on file  Stress: Not on file  Social Connections: Not on file     Family History: The patient's family history includes Asthma in an other family member; Congestive Heart Failure in her father and mother; Emphysema in her mother; Heart attack in her father and mother; Lung disease in her brother and sister. There is no history of Colon cancer.   ROS:   Please see the history of present illness.     All other systems reviewed and are negative.  EKGs/Labs/Other Studies Reviewed:    The following studies were reviewed today:  LEFT HEART CATH AND CORONARY ANGIOGRAPHY 11/07/2017  Conclusion    Conclusions: 1. Eccentric calcification with mild stenosis of the ostial LAD (~20%).  Otherwise, no angiographically significant coronary artery disease. 2. Normal left ventricular systolic function and filling pressure.  Recommendations: 1. Continue medical therapy and risk factor modification to prevent progression of CAD. 2. Monitor overnight to ensure the patient does not have recurrent chest pain.  Anticipate discharge home tomorrow morning.  Recommend Aspirin 61m daily for mild CAD and thoracic aortic aneurysm.    CT 05/2016 IMPRESSION: 1. Resolution of previously described nodule along the right minor fissure. 2. Advanced emphysema, without acute superimposed process. 3. Age advanced coronary artery atherosclerosis. Recommend assessment of coronary risk factors and consideration of medical therapy. 4. Borderline to mild ascending aortic aneurysm, unchanged. Recommend annual imaging followup by CTA or MRA. This recommendation follows 2010  ACCF/AHA/AATS/ACR/ASA/SCA/SCAI/SIR/STS/SVM Guidelines for the Diagnosis and Management of Patients with Thoracic Aortic Disease. Circulation. 2010; 121: A263-F354   Nuclear stress test 06/2016 Study Highlights    Nuclear stress EF: 58%.  Blood pressure demonstrated a normal response to exercise.  There was no ST segment deviation noted during stress.  Defect 1: There is a small defect of moderate severity present in the apical anterior and apex location.  The study is normal.  This is a low risk study.  The left ventricular ejection fraction is normal (55-65%).  Normal stress nuclear study with breast attenuation but no ischemia; EF 58 with normal wall motion.     EKG:  EKG is ordered today.  It shows sinus bradycardia 58 bpm normal EKG unchanged from prior.  This was personally reviewed.  Recent Labs: 02/10/2020: ALT 27; BUN 10; Creatinine, Ser 0.82; Hemoglobin 13.8; Platelets 148; Potassium 4.2; Sodium 144; TSH  4.880   Recent Lipid Panel    Component Value Date/Time   CHOL 192 05/14/2020 0752   TRIG 184 (H) 05/14/2020 0752   HDL 47 05/14/2020 0752   CHOLHDL 4.1 05/14/2020 0752   CHOLHDL 3.4 02/21/2018 0845   VLDL 38 (H) 12/30/2015 1536   LDLCALC 113 (H) 05/14/2020 0752   LDLCALC 83 02/21/2018 0845    Physical Exam:    VS:  BP (!) 142/90   Pulse 73   Ht _0  (1.626 m)   Wt 133 lb (60.3 kg)   SpO2 97%   BMI 22.83 kg/m     Wt Readings from Last 3 Encounters:  05/21/20 133 lb (60.3 kg)  04/14/20 131 lb (59.4 kg)  03/11/20 134 lb (60.8 kg)     Physical Exam Vitals reviewed.  Constitutional:      General: She is not in acute distress.    Appearance: She is well-developed.  HENT:     Head: Normocephalic and atraumatic.  Neck:     Vascular: No JVD.  Cardiovascular:     Rate and Rhythm: Normal rate and regular rhythm.     Pulses: Intact distal pulses.     Heart sounds: Normal heart sounds. No murmur heard. No friction rub. No gallop.   Pulmonary:     Effort: Pulmonary effort is normal. No respiratory distress.     Breath sounds: Normal breath sounds. No wheezing or rales.  Abdominal:     General: Bowel sounds are normal.     Palpations: Abdomen is soft.  Musculoskeletal:        General: No deformity. Normal range of motion.     Cervical back: Normal range of motion and neck supple.  Skin:    General: Skin is warm and dry.  Neurological:     Mental Status: She is alert and oriented to person, place, and time.  Psychiatric:        Behavior: Behavior normal.        Thought Content: Thought content normal.        Judgment: Judgment normal.    Chest CTA 12/11/2019  1. Lung-RADS 3, probably benign findings. Central bronchial wall thickening in the right upper lobe with areas of airway impaction. 7.4 mm nodule in this region may reflect an impacted airway, representing sequelae of atypical infection. Consider therapy and short-term follow-up in 6 months is recommended  with repeat low-dose chest CT without contrast (please use the following order, "CT CHEST LCS NODULE FOLLOW-UP W/O CM"). 2. Emphysema (ICD10-J43.9) and Aortic Atherosclerosis (ICD10-170.0)  ASSESSMENT:    1. Coronary  artery disease involving native coronary artery of native heart without angina pectoris   2. Mixed hyperlipidemia   3. Lower extremity edema      PLAN:    In order of problems listed above:  CAD: Mild nonobstructive CAD by cardiac catheterization on 11/07/2017.  Nares CTA in November 2021 showed Mild non-obstructive CAD (25-49%) in the distal left main and proximal portions of LAD and LCX arteries.  She spoke to our pharmacist Dr. Evelene Croon and decided to try atorvastatin instead of Repatha.  Hyperlipidemia: Significant side effects with rosuvastatin, she did not want to try Repatha that she was approved for, she will try atorvastatin.  Pulmonary nodules: Incidentally noted on CT in 2019, follow-up scan in May 2020 showed decrease in size, most compatible with inflammatory/infectious process. She is being followed by ID and pulmonary specialist on antibiotics for MAC in the last year.  No significant improvement so far. We will obtain an echocardiogram to evaluate for potential pulmonary hypertension.  Intermittent lower extremity edema -resolved with Lasix 20 mg daily.  We will continue.  Medication Adjustments/Labs and Tests Ordered: Current medicines are reviewed at length with the patient today.  Concerns regarding medicines are outlined above. Labs and tests ordered and medication changes are outlined in the patient instructions below:  Patient Instructions  Medication Instructions: Medication Instructions:  NO CHANGES *If you need a refill on your cardiac medications before your next appointment, please call your pharmacy*   Lab Work: NONE If you have labs (blood work) drawn today and your tests are completely normal, you will receive your results only  by: Marland Kitchen MyChart Message (if you have MyChart) OR . A paper copy in the mail If you have any lab test that is abnormal or we need to change your treatment, we will call you to review the results.   Testing/Procedures: NONE   Follow-Up: At [facility], you and your health needs are our priority.  As part of our continuing mission to provide you with exceptional heart care, we have created designated Provider Care Teams.  These Care Teams include your primary Cardiologist (physician) and Advanced Practice Providers (APPs -  Physician Assistants and Nurse Practitioners) who all work together to provide you with the care you need, when you need it.  We recommend signing up for the patient portal called "MyChart".  Sign up information is provided on this After Visit Summary.  MyChart is used to connect with patients for Virtual Visits (Telemedicine).  Patients are able to view lab/test results, encounter notes, upcoming appointments, etc.  Non-urgent messages can be sent to your provider as well.   To learn more about what you can do with MyChart, go to [email or web address].    Your next appointment:   6 month(s)  The format for your next appointment:   In Person  Provider:   Gwyndolyn Kaufman, MD   Other Instructions NONE        Signed, Ena Dawley, MD  05/21/2020 10:57 AM    [place]

## 2020-05-21 NOTE — Telephone Encounter (Signed)
Thank you so much Katrina Campbell!

## 2020-05-28 ENCOUNTER — Ambulatory Visit (INDEPENDENT_AMBULATORY_CARE_PROVIDER_SITE_OTHER): Payer: Medicare Other | Admitting: Infectious Disease

## 2020-05-28 ENCOUNTER — Other Ambulatory Visit: Payer: Self-pay

## 2020-05-28 ENCOUNTER — Encounter: Payer: Self-pay | Admitting: Infectious Disease

## 2020-05-28 VITALS — BP 145/85 | HR 62 | Temp 97.7°F | Ht 64.0 in | Wt 132.0 lb

## 2020-05-28 DIAGNOSIS — A310 Pulmonary mycobacterial infection: Secondary | ICD-10-CM

## 2020-05-28 DIAGNOSIS — K036 Deposits [accretions] on teeth: Secondary | ICD-10-CM

## 2020-05-28 DIAGNOSIS — J449 Chronic obstructive pulmonary disease, unspecified: Secondary | ICD-10-CM | POA: Diagnosis not present

## 2020-05-28 DIAGNOSIS — I2510 Atherosclerotic heart disease of native coronary artery without angina pectoris: Secondary | ICD-10-CM | POA: Diagnosis not present

## 2020-05-28 DIAGNOSIS — J471 Bronchiectasis with (acute) exacerbation: Secondary | ICD-10-CM

## 2020-05-28 NOTE — Progress Notes (Signed)
Subjective:   Chief complaint: Still with a bit of a cough but not as bad as in the past   Patient ID: Katrina Campbell, female    DOB: [birth year], 65 y.o.   MRN: [id number]  HPI  65 year old female, former smoker quit 2014. 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  She had been on a 3 times a week regimen  but had not had a great deal of improvement  We then changed her to azithromycin 500 mg daily rifampin 600 mg daily and ethambutol 900 mg daily.  Since changed to new regimen her coughing improved dramatically.  She is actually having trouble getting up phlegm even using the vest provided by pulmonary.  She had in the interim  had problems with staining of her teeth with red flecks throughout which the dental hygienist and dentist had a great deal of difficulty removing from her teeth.  Katrina Campbell endeavored to procure Clofazamine the idea of swapping this drug in for her rifamycin.  However she initially did not want to do this.  she had started this regimen on 11/26/2019.  Sputum sent that same day was smear negative for AFB and final growth negative.   Cough was worse since switch from RIF to clofazaimine.  Clear mucous + occ blood streaks. She wished for 2nd opinion and we referred her to Dr. Lorenda Campbell at [facility] who saw her on 01/13/2020.  Patient had relayed to him decreased hearing and due to concerns re possible macrolide causing this her azithromycin dose was reduced.  Dr. Lorenda Campbell was not entirely convinced how much of pts symptoms were due to MAvium vs her COPD that underlies this.  She had resumed her azithromycin at lower dose, rifampin and ethambutol.  She had improvement in cough again but chronic nausea and did not enjoy the ordeal of taking all of the meds she currently has to take for rx of M  avium.  Since I last saw her she came off of her antimycobacterial antibiotics.  She is going to see a dentist to have her teeth cleaned and have the rifampin stain removed.  She still has a bit of a cough but not nearly what she had in the past.        Past Medical History:  Diagnosis Date  . Anxiety   . COPD (chronic obstructive pulmonary disease) ([place])   . Dental staining 07/15/2019  . Depression   . Hyperlipidemia   . Labile hypertension   . 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 LEFT HEART CATH AND CORONARY ANGIOGRAPHY N/A 11/07/2017   Procedure: LEFT HEART CATH AND CORONARY ANGIOGRAPHY;  Surgeon: Nelva Bush, MD;  Location: [facility];  Service: Cardiovascular;  Laterality: N/A;  . NASAL SINUS SURGERY  1986  . VIDEO BRONCHOSCOPY Bilateral 01/29/2019   Procedure: VIDEO BRONCHOSCOPY WITH FLUORO;  Surgeon: Marshell Garfinkel, MD;  Location: [facility];  Service: Cardiopulmonary;  Laterality: Bilateral;    Family History  Problem Relation Age of Onset  . Heart attack Mother   . Emphysema Mother   . Congestive Heart Failure Mother   . Heart attack Father   . Congestive Heart Failure Father   . Asthma Other   . Lung disease Sister   . Lung disease Brother   . Colon  cancer Neg Hx       Social History   Socioeconomic History  . Marital status: Married    Spouse name: Not on file  . Number of children: Not on file  . Years of education: Not on file  . Highest education level: Not on file  Occupational History  . Not on file  Tobacco Use  . Smoking status: Former Smoker    Packs/day: 1.75    Years: 44.00    Pack years: 77.00    Types: Cigarettes    Quit date: 11/09/2012    Years since quitting: 7.5  . Smokeless tobacco: Never Used  . Tobacco comment: vapor  Vaping Use  . Vaping Use: Former  Substance and Sexual Activity  . Alcohol use: Yes    Alcohol/week: 24.0 standard drinks     Types: 24 Cans of beer per week  . Drug use: No  . Sexual activity: Not Currently  Other Topics Concern  . Not on file  Social History Narrative  . Not on file   Social Determinants of Health   Financial Resource Strain: Not on file  Food Insecurity: Not on file  Transportation Needs: Not on file  Physical Activity: Not on file  Stress: Not on file  Social Connections: Not on file    Allergies  Allergen Reactions  . Crestor [Rosuvastatin]     myalgia     Current Outpatient Medications:  .  albuterol (PROAIR HFA) 108 (90 Base) MCG/ACT inhaler, Inhale 2 puffs into the lungs every 6 (six) hours as needed for wheezing or shortness of breath., Disp: 24 g, Rfl: 0 .  albuterol (PROVENTIL) (2.5 MG/3ML) 0.083% nebulizer solution, Take 3 mLs (2.5 mg total) by nebulization every 6 (six) hours as needed for wheezing or shortness of breath., Disp: 540 mL, Rfl: 3 .  aspirin EC 81 MG tablet, Take 81 mg by mouth daily with lunch. , Disp: , Rfl:  .  atorvastatin (LIPITOR) 10 MG tablet, Take 1 tablet (10 mg total) by mouth daily., Disp: 30 tablet, Rfl: 0 .  buPROPion (WELLBUTRIN XL) 300 MG 24 hr tablet, Take 1 tablet (300 mg total) by mouth daily., Disp: 90 tablet, Rfl: 3 .  Calcium Carb-Cholecalciferol (CALCIUM-VITAMIN D) 500-400 MG-UNIT TABS, 1 a day, Disp: 60 tablet, Rfl:  .  Coenzyme Q-10 200 MG CAPS, Take 200 mg by mouth daily., Disp: , Rfl:  .  ezetimibe (ZETIA) 10 MG tablet, Take 1 tablet (10 mg total) by mouth daily., Disp: 90 tablet, Rfl: 1 .  fluticasone (FLONASE) 50 MCG/ACT nasal spray, Place 2 sprays into both nostrils as needed for allergies or rhinitis. , Disp: , Rfl:  .  guaiFENesin (MUCINEX) 600 MG 12 hr tablet, Take 600 mg by mouth 2 (two) times daily as needed for to loosen phlegm. Patient takes every other day, Disp: , Rfl:  .  Magnesium 500 MG TABS, Take 500 mg by mouth daily with lunch., Disp: , Rfl:  .  Respiratory Therapy Supplies (FLUTTER) DEVI, Use as directed, Disp: 1  each, Rfl: 0 .  temazepam (RESTORIL) 15 MG capsule, TAKE 1 CAPSULE AT BEDTIME AS NEEDED FOR SLEEP, Disp: 90 capsule, Rfl: 0 .  umeclidinium-vilanterol (ANORO ELLIPTA) 62.5-25 MCG/INH AEPB, Inhale 1 puff into the lungs daily., Disp: 180 each, Rfl: 3   Review of Systems  Constitutional: Negative for chills and fever.  HENT: Negative for congestion and sore throat.   Eyes: Negative for photophobia.  Respiratory: Positive for cough. Negative for shortness  of breath and wheezing.   Cardiovascular: Negative for chest pain, palpitations and leg swelling.  Gastrointestinal: Negative for abdominal pain, blood in stool, constipation, diarrhea and vomiting.  Genitourinary: Negative for dysuria, flank pain and hematuria.  Musculoskeletal: Negative for back pain and myalgias.  Skin: Negative for rash.  Neurological: Negative for dizziness, weakness and headaches.  Hematological: Does not bruise/bleed easily.  Psychiatric/Behavioral: Negative for agitation, dysphoric mood and suicidal ideas.       Objective:   Physical Exam Constitutional:      General: She is not in acute distress.    Appearance: She is not diaphoretic.  HENT:     Head: Normocephalic and atraumatic.     Right Ear: External ear normal.     Left Ear: External ear normal.     Nose: Nose normal.     Mouth/Throat:     Pharynx: No oropharyngeal exudate.  Eyes:     General: No scleral icterus.    Conjunctiva/sclera: Conjunctivae normal.     Pupils: Pupils are equal, round, and reactive to light.  Cardiovascular:     Rate and Rhythm: Normal rate and regular rhythm.     Heart sounds: Normal heart sounds. No murmur heard. No friction rub. No gallop.   Pulmonary:     Effort: Pulmonary effort is normal. Prolonged expiration present. No respiratory distress.     Breath sounds: Normal breath sounds. Decreased air movement present. No stridor. No wheezing or rhonchi.  Abdominal:     General: Bowel sounds are normal.     Palpations:  Abdomen is soft.  Musculoskeletal:        General: No tenderness. Normal range of motion.     Cervical back: Normal range of motion and neck supple.  Lymphadenopathy:     Cervical: No cervical adenopathy.  Skin:    General: Skin is warm and dry.     Coloration: Skin is not pale.     Findings: No erythema or rash.  Neurological:     General: No focal deficit present.     Mental Status: She is alert and oriented to person, place, and time.     Coordination: Coordination normal.  Psychiatric:        Attention and Perception: Attention and perception normal.        Mood and Affect: Mood is depressed. Affect is tearful.        Speech: Speech normal.        Behavior: Behavior normal.        Thought Content: Thought content normal.        Cognition and Memory: Cognition and memory normal.        Judgment: Judgment normal.        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  We will monitor off therapy and see her back in 6 months.  Dental staining should improve off rifampin and with cleaning.  COPD followed by Dr. Valeta Harms

## 2020-06-03 ENCOUNTER — Telehealth: Payer: Self-pay | Admitting: Pharmacist

## 2020-06-03 ENCOUNTER — Telehealth: Payer: Self-pay | Admitting: Acute Care

## 2020-06-03 DIAGNOSIS — I2510 Atherosclerotic heart disease of native coronary artery without angina pectoris: Secondary | ICD-10-CM

## 2020-06-03 DIAGNOSIS — E785 Hyperlipidemia, unspecified: Secondary | ICD-10-CM

## 2020-06-03 MED ORDER — ROSUVASTATIN CALCIUM 5 MG PO TABS: 5.0000 mg | ORAL_TABLET | Freq: Every day | ORAL | 0 refills | Status: DC

## 2020-06-03 NOTE — Telephone Encounter (Signed)
06/03/2020  Called and spoke with patient.  I see active order in place for lung cancer screening nodule follow-up CT.  In the order it specifically states this is due on 06/09/2020.  I do not believe that this is been formally scheduled.  I reviewed with patient as well.  I reviewed patient's appointment notes and do not see scheduled CT appointment in [facility].  We will send message to patient care coordinator team, Liane Comber, NP.  So we can work on getting the patient scheduled for this CT.  Wyn Quaker, FNP

## 2020-06-03 NOTE — Telephone Encounter (Signed)
My guess is that we have called the patient and she has not called [place] back.  Katrina Campbell and Katrina Campbell, can you see if we can get this patient scheduled? Thanks so much

## 2020-06-03 NOTE — Telephone Encounter (Signed)
I have spoke with Katrina Campbell and her LCS CT has been scheduled on 06/19/2020 @ 11:50am at  [facility] and she is aware

## 2020-06-03 NOTE — Telephone Encounter (Signed)
Patient called back, preferred rosuvastatin be [facility].  Rx sent

## 2020-06-03 NOTE — Telephone Encounter (Signed)
Patient called and left voicemail.  Spoke with patient.  Atorvastatin causing muscle pain and shortness of breath.  Would prefer to be on rosuvastatin since it only caused muscle pain.  Patient still resistant to starting Repatha.  Will start at 5 mg daily to see if patient can tolerate and then slowly titrate.  Will place lipid order for 3 months.

## 2020-06-04 ENCOUNTER — Telehealth: Payer: Self-pay | Admitting: Pulmonary Disease

## 2020-06-04 DIAGNOSIS — J471 Bronchiectasis with (acute) exacerbation: Secondary | ICD-10-CM

## 2020-06-04 DIAGNOSIS — J449 Chronic obstructive pulmonary disease, unspecified: Secondary | ICD-10-CM

## 2020-06-04 MED ORDER — ANORO ELLIPTA 62.5-25 MCG/INH IN AEPB: 1.0000 | INHALATION_SPRAY | Freq: Every day | RESPIRATORY_TRACT | 3 refills | Status: DC

## 2020-06-04 NOTE — Telephone Encounter (Signed)
Spoke with patient. She stated that she needed a refill on her Anoro. She was requesting a 90 day supply to be sent to [facility]. This has been sent in to [place]. She verbalized understanding.   Nothing further needed at time of call.

## 2020-06-05 ENCOUNTER — Other Ambulatory Visit: Payer: Self-pay | Admitting: Pulmonary Disease

## 2020-06-15 NOTE — Telephone Encounter (Signed)
Thank you Gilman Schmidt, FNP

## 2020-06-19 ENCOUNTER — Ambulatory Visit
Admission: RE | Admit: 2020-06-19 | Discharge: 2020-06-19 | Disposition: A | Discharge status: Home / Self Care | Payer: Medicare Other | Source: Ambulatory Visit | Attending: Acute Care | Admitting: Acute Care

## 2020-06-19 ENCOUNTER — Other Ambulatory Visit: Payer: Self-pay

## 2020-06-19 DIAGNOSIS — Z87891 Personal history of nicotine dependence: Secondary | ICD-10-CM

## 2020-07-01 ENCOUNTER — Other Ambulatory Visit: Payer: Self-pay | Admitting: Acute Care

## 2020-07-01 ENCOUNTER — Telehealth: Payer: Self-pay | Admitting: Acute Care

## 2020-07-01 DIAGNOSIS — A310 Pulmonary mycobacterial infection: Secondary | ICD-10-CM

## 2020-07-01 DIAGNOSIS — J479 Bronchiectasis, uncomplicated: Secondary | ICD-10-CM

## 2020-07-01 DIAGNOSIS — R059 Cough, unspecified: Secondary | ICD-10-CM

## 2020-07-01 NOTE — Progress Notes (Signed)
I have called the patient with the results of her low dose CT. Lung RADS 2: nodules that are benign in appearance and behavior with a very low likelihood of becoming a clinically active cancer due to size or lack of growth. Recommendation per radiology is for a repeat LDCT in 12 months.  There was notation of New/increased mucoid impaction in the anterior segment right upper lobe.  She has been followed by ID, she stopped antibiotics since 02/2020. She is not due to see them until 11/2020 She notes she is more short winded since February.  She walks on treadmill daily.  Start Chest PT twice daily Add Flutter valve 4 times daily Increase Mucinex daily Albuterol nebs BID 3 month follow up CT Chest without contrast Secretions are clear with some blood tinged secretions at times.  Sputum culture  Katrina Campbell, please place order for 3 month follow up CT Chest without. Thanks  Triage, patient is planning on picking up culture bottles Friday, please place at reception desk with her name and instructions. Thanks  Dr. Valeta Harms, I am having her increase her pulmonary Toilet as above, and we will do a 3 month follow up CT to see if she is better , worse or the same. She may need to restart her antibiotics if she worsens. Let me [facility] your thoughts.

## 2020-07-01 NOTE — Progress Notes (Signed)
She will need a 3 month follow up CT, and then most likely her annual 09/2021 if the 3 month CT looks ok. Thanks

## 2020-07-02 ENCOUNTER — Other Ambulatory Visit: Payer: Self-pay | Admitting: Family Medicine

## 2020-07-02 NOTE — Telephone Encounter (Signed)
Ok to refill??  Last office visit/ refill 03/11/2020.

## 2020-07-03 NOTE — Telephone Encounter (Signed)
See result note 07/01/20. Order placed to 3 mth Chest CT w/o contrast.

## 2020-07-06 ENCOUNTER — Other Ambulatory Visit: Payer: Medicare Other

## 2020-07-06 ENCOUNTER — Other Ambulatory Visit: Payer: Self-pay | Admitting: *Deleted

## 2020-07-06 DIAGNOSIS — A310 Pulmonary mycobacterial infection: Secondary | ICD-10-CM

## 2020-07-06 MED ORDER — TEMAZEPAM 15 MG PO CAPS
ORAL_CAPSULE | ORAL | 0 refills | Status: DC
Start: 2020-07-06 — End: 2020-08-10

## 2020-07-06 NOTE — Telephone Encounter (Signed)
Received call from patient.   Requested that 90 day supply of Restoril be sent to mail order.   Advised that since PCP has left office, fellow MD will only give 30 day supply of controlled substances so that patient can find new PCP.   States that she has had original prescription cancelled as she wanted increase in quantity. States that she will require new prescription to mail order.   Ok to re-send?

## 2020-07-09 ENCOUNTER — Telehealth: Payer: Self-pay | Admitting: *Deleted

## 2020-07-09 DIAGNOSIS — Z006 Encounter for examination for normal comparison and control in clinical research program: Secondary | ICD-10-CM

## 2020-07-09 NOTE — Telephone Encounter (Signed)
I called patient for 90-day Identify Study phone call. Patient is not having any cardiac symptoms. I reminded patient I would call in November for 1 -year follow-up.

## 2020-07-13 NOTE — Telephone Encounter (Signed)
Called and spoke with the pt  She reviewed her sputum culture results in mychart and is concerned  I advised that the final result will take approx 6 wks to return, and what she is seeing is the preliminary result  She wants to know if the results of preliminary are anything to be concerned about and what they mean  Please advise, thanks!

## 2020-08-04 LAB — FUNGUS CULTURE W SMEAR
CULTURE:: NO GROWTH
MICRO NUMBER:: 11727121
SMEAR:: NONE SEEN
SPECIMEN QUALITY:: ADEQUATE

## 2020-08-04 LAB — RESPIRATORY CULTURE OR RESPIRATORY AND SPUTUM CULTURE
MICRO NUMBER:: 11727122
SPECIMEN QUALITY:: ADEQUATE

## 2020-08-10 ENCOUNTER — Telehealth: Payer: Self-pay | Admitting: Pulmonary Disease

## 2020-08-10 ENCOUNTER — Ambulatory Visit (INDEPENDENT_AMBULATORY_CARE_PROVIDER_SITE_OTHER): Payer: Medicare Other | Admitting: Medical

## 2020-08-10 ENCOUNTER — Other Ambulatory Visit: Payer: Self-pay

## 2020-08-10 ENCOUNTER — Encounter: Payer: Self-pay | Admitting: Medical

## 2020-08-10 VITALS — BP 148/80 | HR 65 | Ht 64.0 in | Wt 128.6 lb

## 2020-08-10 DIAGNOSIS — E559 Vitamin D deficiency, unspecified: Secondary | ICD-10-CM

## 2020-08-10 DIAGNOSIS — F419 Anxiety disorder, unspecified: Secondary | ICD-10-CM

## 2020-08-10 DIAGNOSIS — F320 Major depressive disorder, single episode, mild: Secondary | ICD-10-CM

## 2020-08-10 DIAGNOSIS — F5104 Psychophysiologic insomnia: Secondary | ICD-10-CM

## 2020-08-10 DIAGNOSIS — E785 Hyperlipidemia, unspecified: Secondary | ICD-10-CM

## 2020-08-10 DIAGNOSIS — R0989 Other specified symptoms and signs involving the circulatory and respiratory systems: Secondary | ICD-10-CM

## 2020-08-10 DIAGNOSIS — J42 Unspecified chronic bronchitis: Secondary | ICD-10-CM

## 2020-08-10 DIAGNOSIS — I700 Atherosclerosis of aorta: Secondary | ICD-10-CM

## 2020-08-10 DIAGNOSIS — I2510 Atherosclerotic heart disease of native coronary artery without angina pectoris: Secondary | ICD-10-CM

## 2020-08-10 MED ORDER — TEMAZEPAM 15 MG PO CAPS: ORAL_CAPSULE | ORAL | 0 refills | Status: DC

## 2020-08-10 NOTE — Progress Notes (Signed)
Subjective:  Katrina Campbell is a 65 y.o. female who presents for Chief Complaint  Patient presents with  . New Patient (Initial Visit)    Pt present to establish care. Wants to discuss medications and any needed refills.      Here as a new patient.  She was seen Dr. Vic Blackbird prior until she left that practice  Here to establish care.   Dr. Ena Dawley, cardiology Dr. June Leap, pulmology Dr. Rhina [place], infectious disease  She has a history of atherosclerosis, history of high cholesterol.  She failed Zetia in the past.  She is only tolerating low-dose Crestor 5 mg daily for now.  She sees pulmonology regularly.  She has a history of Mycobacterium avium infection and severe COPD.  She is compliant with Anoro, albuterol.  Up until last November she was on 4 antibiotics daily.  She notes a diagnosis of COPD prior to 2014.  She quit smoking in 2014.   Was on trelegy, but lung doctor changed back to anoro  She needs a refill on her sleep aid.  She has tried several medicines in the past including Benadryl, melatonin, trazodone, and none seem to help very well.  She does feel like anxiety or her nerves is a big reason why she has trouble getting to sleep.  The temazepam does help although she does not like being on a controlled substance.  She needs a 90-day supply to her mail order pharmacy.  She just got her last 30-day refill but the interim provider would not refill 90 days, advised her to establish with PCP since Dr. [name] left.   She notes losing her daughter to drugs about 5 years ago and still has difficulty with her loss.  She is on Wellbutrin for history depression and anxiety.  She lives with her husband.  She does not work currently  She would like to be able to stop some of her oral medications and wonders about her vitamins.  No other aggravating or relieving factors.    No other c/o.  Past Medical History:  Diagnosis Date  . Anxiety   . COPD (chronic  obstructive pulmonary disease) ([place])   . Dental staining 07/15/2019  . Depression   . Hyperlipidemia   . Labile hypertension   . Pulmonary mycobacterial infection ([place]) 10/24/2018   Current Outpatient Medications on File Prior to Visit  Medication Sig Dispense Refill  . albuterol (PROAIR HFA) 108 (90 Base) MCG/ACT inhaler Inhale 2 puffs into the lungs every 6 (six) hours as needed for wheezing or shortness of breath. 24 g 0  . albuterol (PROVENTIL) (2.5 MG/3ML) 0.083% nebulizer solution Take 3 mLs (2.5 mg total) by nebulization every 6 (six) hours as needed for wheezing or shortness of breath. 540 mL 3  . aspirin EC 81 MG tablet Take 81 mg by mouth daily with lunch.     Marland Kitchen buPROPion (WELLBUTRIN XL) 300 MG 24 hr tablet Take 1 tablet (300 mg total) by mouth daily. 90 tablet 3  . Coenzyme Q-10 200 MG CAPS Take 200 mg by mouth daily.    . fluticasone (FLONASE) 50 MCG/ACT nasal spray Place 2 sprays into both nostrils as needed for allergies or rhinitis.     Marland Kitchen guaiFENesin (MUCINEX) 600 MG 12 hr tablet Take 600 mg by mouth 2 (two) times daily as needed for to loosen phlegm. Patient takes every other day    . Magnesium 500 MG TABS Take 500 mg by mouth daily with lunch.    Marland Kitchen  Respiratory Therapy Supplies (FLUTTER) DEVI Use as directed 1 each 0  . rosuvastatin (CRESTOR) 5 MG tablet Take 1 tablet (5 mg total) by mouth daily. 90 tablet 0  . umeclidinium-vilanterol (ANORO ELLIPTA) 62.5-25 MCG/INH AEPB Inhale 1 puff into the lungs daily. 180 each 3  . vitamin B-12 (CYANOCOBALAMIN) 1000 MCG tablet Take 1,000 mcg by mouth daily.    . Vitamin D, Ergocalciferol, (DRISDOL) 1.25 MG (50000 UNIT) CAPS capsule Take 50,000 Units by mouth every 7 (seven) days.    . Calcium Carb-Cholecalciferol (CALCIUM-VITAMIN D) 500-400 MG-UNIT TABS 1 a day 60 tablet    No current facility-administered medications on file prior to visit.     The following portions of the patient's history were reviewed and updated as appropriate:  allergies, current medications, past family history, past medical history, past social history, past surgical history and problem list.  ROS Otherwise as in subjective above    Objective: BP (!) 148/80   Pulse 65   Ht 5\' 4"  (1.626 m)   Wt 128 lb 9.6 oz (58.3 kg)   SpO2 97%   BMI 22.07 kg/m   General appearance: alert, no distress, well developed, well nourished Neck: supple, no lymphadenopathy, no thyromegaly, no masses Heart: RRR, normal S1, S2, no murmurs Lungs: Diffusely reduced breath sounds throughout, no wheezes, rhonchi, or rales pulses: 2+ radial pulses, 2+ pedal pulses, normal cap refill Ext: no edema Psych: Pleasant, answers questions appropriately, good eye contact    Assessment: Encounter Diagnoses  Name Primary?  . Chronic bronchitis, unspecified chronic bronchitis type ([place]) Yes  . Current mild episode of major depressive disorder without prior episode ([place])   . Anxiety   . Atherosclerosis of aorta ([place])   . Labile hypertension   . Hyperlipidemia, unspecified hyperlipidemia type   . Vitamin D deficiency   . Chronic insomnia      Plan: COPD with history of Mycobacterium avium, sees infectious disease and pulmonology, compliant with medications.  Not currently on oxygen.  She quit smoking 2014.  She notes diagnosis of COPD before 2014  Anxiety and history of depression-continues on Wellbutrin.  Referral to [facility] to establish with counseling.  She lost her daughter to drug abuse and still has a lot of grief issues surrounding this.  Chronic insomnia- we discussed other options.  She has fail trazodone, Benadryl, melatonin.  For now we will continue Restoril temazepam.  However we will refer to counseling to help with sleep issues, likely mostly related to anxiety  Hypertension, atherosclerosis-on statin, sees cardiology  Vitamin D deficiency-continue supplement  She would like to come off of some medications.  We will see her in a month for follow-up  and at that time we will check a magnesium and B12 level to see if she needs to continue supplements.  She started these 2 supplements on her own.  She will stop these for now and recheck in 1 month  Nasha was seen today for new patient (initial visit).  Diagnoses and all orders for this visit:  Chronic bronchitis, unspecified chronic bronchitis type ([place])  Current mild episode of major depressive disorder without prior episode ([place])  Anxiety  Atherosclerosis of aorta (HCC)  Labile hypertension  Hyperlipidemia, unspecified hyperlipidemia type  Vitamin D deficiency  Chronic insomnia  Other orders -     Discontinue: temazepam (RESTORIL) 15 MG capsule; TAKE 1 CAPSULE AT BEDTIME AS NEEDED FOR SLEEP (NEED OFFICE VISIT) -     temazepam (RESTORIL) 15 MG capsule; TAKE 1 CAPSULE AT  BEDTIME AS NEEDED FOR SLEEP (NEED OFFICE VISIT)    Follow up: 78mo

## 2020-08-10 NOTE — Telephone Encounter (Signed)
Called and spoke with pt and she stated that she got a message that stated she needed to called and set up a time with [facility] to pick up her flutter DEVI.  I advised her that we did not send in any rx for the flutter device.  This was sent in last year and that is not something that we replace yearly.  Pt is aware and nothing further is needed.

## 2020-08-11 NOTE — Progress Notes (Signed)
Referral has been made.

## 2020-08-17 ENCOUNTER — Other Ambulatory Visit: Payer: Medicare Other | Admitting: *Deleted

## 2020-08-17 ENCOUNTER — Other Ambulatory Visit: Payer: Self-pay

## 2020-08-17 DIAGNOSIS — I2510 Atherosclerotic heart disease of native coronary artery without angina pectoris: Secondary | ICD-10-CM

## 2020-08-17 DIAGNOSIS — E785 Hyperlipidemia, unspecified: Secondary | ICD-10-CM

## 2020-08-17 LAB — LIPID PANEL
Chol/HDL Ratio: 3 ratio (ref 0.0–4.4)
Cholesterol, Total: 154 mg/dL (ref 100–199)
HDL: 51 mg/dL (ref >39)
LDL Chol Calc (NIH): 85 mg/dL (ref 0–99)
Triglycerides: 96 mg/dL (ref 0–149)
VLDL Cholesterol Cal: 18 mg/dL (ref 5–40)

## 2020-08-20 ENCOUNTER — Telehealth: Payer: Self-pay | Admitting: Pharmacist

## 2020-08-20 NOTE — Telephone Encounter (Signed)
Spoke with patient regarding lipid test.  LDL dropped to 85 on crestor 5mg .  Still above goal but has improved.  Patient is tolerating 5 mg daily.  Questioned if she would like to increase to 10mg  but patient declines at this time.

## 2020-08-27 ENCOUNTER — Other Ambulatory Visit: Payer: Self-pay | Admitting: Pharmacist

## 2020-08-27 DIAGNOSIS — I2510 Atherosclerotic heart disease of native coronary artery without angina pectoris: Secondary | ICD-10-CM

## 2020-08-27 DIAGNOSIS — E785 Hyperlipidemia, unspecified: Secondary | ICD-10-CM

## 2020-08-27 MED ORDER — ROSUVASTATIN CALCIUM 5 MG PO TABS: 5.0000 mg | ORAL_TABLET | Freq: Every day | ORAL | 3 refills | Status: DC

## 2020-09-07 ENCOUNTER — Ambulatory Visit: Payer: Medicare Other | Admitting: Medical

## 2020-09-21 ENCOUNTER — Telehealth: Payer: Self-pay | Admitting: Acute Care

## 2020-09-21 ENCOUNTER — Ambulatory Visit
Admission: RE | Admit: 2020-09-21 | Discharge: 2020-09-21 | Disposition: A | Discharge status: Home / Self Care | Payer: Medicare Other | Source: Ambulatory Visit | Attending: Acute Care | Admitting: Acute Care

## 2020-09-21 DIAGNOSIS — J479 Bronchiectasis, uncomplicated: Secondary | ICD-10-CM

## 2020-09-21 NOTE — Telephone Encounter (Signed)
Call report from [facility] at [facility] Radiology on Chest CT dated 09/21/20   Impression:  IMPRESSION: 1. Subcarinal lymphadenopathy, increased since 06/19/2020 CT and new since 12/11/2019 CT, concerning for metastatic disease. Persistent branching tubular opacity in the anterior central right upper lobe associated with abrupt cut off of a segmental anterior right upper lobe bronchus, not substantially changed since 06/19/2020 CT. Underlying endobronchial malignancy not excluded. Multidisciplinary thoracic oncology consultation suggested. PET-CT recommended for further characterization. 2. Severe centrilobular emphysema with diffuse bronchial wall thickening, compatible with the provided history of COPD. 3. Left main and two-vessel coronary atherosclerosis. 4. Aortic Atherosclerosis (ICD10-I70.0) and Emphysema (ICD10-J43.9).   These results will be called to the ordering clinician or representative by the Radiologist Assistant, and communication documented in the PACS or [employer].     Electronically Signed   By: Ilona Sorrel M.D.   On: 09/21/2020 15:49  Forwarding to Judson Roch marked as urgent

## 2020-09-22 ENCOUNTER — Telehealth: Payer: Self-pay | Admitting: Acute Care

## 2020-09-22 ENCOUNTER — Encounter: Payer: Self-pay | Admitting: Medical

## 2020-09-22 ENCOUNTER — Telehealth: Payer: Self-pay | Admitting: Medical

## 2020-09-22 ENCOUNTER — Other Ambulatory Visit: Payer: Self-pay

## 2020-09-22 ENCOUNTER — Other Ambulatory Visit: Payer: Self-pay | Admitting: Acute Care

## 2020-09-22 ENCOUNTER — Ambulatory Visit (INDEPENDENT_AMBULATORY_CARE_PROVIDER_SITE_OTHER): Payer: Medicare Other | Admitting: Medical

## 2020-09-22 VITALS — BP 140/80 | HR 61 | Ht 64.0 in | Wt 128.8 lb

## 2020-09-22 DIAGNOSIS — R946 Abnormal results of thyroid function studies: Secondary | ICD-10-CM | POA: Insufficient documentation

## 2020-09-22 DIAGNOSIS — E785 Hyperlipidemia, unspecified: Secondary | ICD-10-CM | POA: Diagnosis not present

## 2020-09-22 DIAGNOSIS — E559 Vitamin D deficiency, unspecified: Secondary | ICD-10-CM

## 2020-09-22 DIAGNOSIS — E0789 Other specified disorders of thyroid: Secondary | ICD-10-CM

## 2020-09-22 DIAGNOSIS — R252 Cramp and spasm: Secondary | ICD-10-CM | POA: Insufficient documentation

## 2020-09-22 DIAGNOSIS — J42 Unspecified chronic bronchitis: Secondary | ICD-10-CM | POA: Diagnosis not present

## 2020-09-22 DIAGNOSIS — I700 Atherosclerosis of aorta: Secondary | ICD-10-CM

## 2020-09-22 DIAGNOSIS — R5383 Other fatigue: Secondary | ICD-10-CM

## 2020-09-22 DIAGNOSIS — E538 Deficiency of other specified B group vitamins: Secondary | ICD-10-CM | POA: Insufficient documentation

## 2020-09-22 DIAGNOSIS — R7989 Other specified abnormal findings of blood chemistry: Secondary | ICD-10-CM

## 2020-09-22 DIAGNOSIS — F320 Major depressive disorder, single episode, mild: Secondary | ICD-10-CM

## 2020-09-22 DIAGNOSIS — F5104 Psychophysiologic insomnia: Secondary | ICD-10-CM | POA: Diagnosis not present

## 2020-09-22 DIAGNOSIS — E569 Vitamin deficiency, unspecified: Secondary | ICD-10-CM

## 2020-09-22 DIAGNOSIS — I2510 Atherosclerotic heart disease of native coronary artery without angina pectoris: Secondary | ICD-10-CM

## 2020-09-22 DIAGNOSIS — J471 Bronchiectasis with (acute) exacerbation: Secondary | ICD-10-CM

## 2020-09-22 DIAGNOSIS — R9389 Abnormal findings on diagnostic imaging of other specified body structures: Secondary | ICD-10-CM

## 2020-09-22 DIAGNOSIS — R590 Localized enlarged lymph nodes: Secondary | ICD-10-CM

## 2020-09-22 MED ORDER — ROSUVASTATIN CALCIUM 10 MG PO TABS: 10.0000 mg | ORAL_TABLET | Freq: Every day | ORAL | 3 refills | Status: DC

## 2020-09-22 NOTE — Progress Notes (Signed)
Subjective:  Katrina Campbell is a 66 y.o. female who presents for Chief Complaint  Patient presents with   Follow-up    4 week follow up on bronchitis and magnesium      I saw her as a new patient back in May 2022  Here for follow-up on couple different issues.  She has COPD, history of abnormal chest scan.  She recently saw pulmonology for lung cancer chest CT screening.  She had the test yesterday.  She would like to be discussed that test.  She notes that if she waits on pulmonology it may take more than a week to hear back from them.  She continues on her therapy for COPD, compliant with those medications and inhalers.  She sees cardiology.  Last stress test 2 years ago.  Sees cardiology regularly.  She is currently on Crestor 5 mg but in the past was on 10 mg.  She did not tolerate Zetia.  But she wonders if she should be on a higher dose.  She seems to tolerate Crestor.   Last visit she went off magnesium and B12 supplements and want to recheck those labs today being off the medication.  She is compliant with vitamin D supplement  She denies fever, night sweats, weight loss, no appetite change, no nausea or vomiting.  No other aggravating or relieving factors.    No other c/o.  The following portions of the patient's history were reviewed and updated as appropriate: allergies, current medications, past family history, past medical history, past social history, past surgical history and problem list.  ROS Otherwise as in subjective above    Objective: BP 140/80   Pulse 61   Ht 5\' 4"  (1.626 m)   Wt 128 lb 12.8 oz (58.4 kg)   SpO2 97%   BMI 22.11 kg/m   Wt Readings from Last 3 Encounters:  09/22/20 128 lb 12.8 oz (58.4 kg)  08/10/20 128 lb 9.6 oz (58.3 kg)  05/28/20 132 lb (59.9 kg)    General appearance: alert, no distress, well developed, well nourished Neck: supple, no lymphadenopathy, no thyromegaly, no masses     Assessment: Encounter Diagnoses  Name  Primary?   Chronic bronchitis, unspecified chronic bronchitis type ([place]) Yes   Vitamin D deficiency    Hyperlipidemia, unspecified hyperlipidemia type    Chronic insomnia    Bronchiectasis with acute exacerbation (HCC)    Atherosclerosis of aorta (HCC)    Leg cramp    Vitamin deficiency    B12 deficiency    Current mild episode of major depressive disorder without prior episode (HCC)    Fatigue, unspecified type    Deficiency of other specified B group vitamins     Abnormal CT of the chest    Abnormal thyroid blood test    Abnormal thyroid exam    Other specified disorders of thyroid       Plan: We discussed numerous issues today  COPD-follow-up with pulmonology and continue current therapies  Hyperlipidemia, atherosclerosis-continue statin. i reviewed her most recent lipids done a few months ago, however given her recent chest CT scan done yesterday and findings of atherosclerosis in the coronaries and aorta, increase to Crestor 10mg  daily  Vitamin deficiency, history of vitamin D deficiency-updated labs today for B12 magnesium and vitamin D  Abnormal thyroid labs over the past year-recheck labs today.  She has no specific symptoms other than fatigue  We discussed her chest CT done yesterday.  We discussed findings that would require some  other follow-up.  I will reach out to her pulmonology office to coordinate next steps   Katrina Campbell was seen today for follow-up.  Diagnoses and all orders for this visit:  Chronic bronchitis, unspecified chronic bronchitis type ([place])  Vitamin D deficiency -     VITAMIN D 25 Hydroxy (Vit-D Deficiency, Fractures)  Hyperlipidemia, unspecified hyperlipidemia type  Chronic insomnia  Bronchiectasis with acute exacerbation (HCC)  Atherosclerosis of aorta (HCC)  Leg cramp -     Vitamin B12 -     Magnesium  Vitamin deficiency -     Vitamin B12 -     Magnesium -     VITAMIN D 25 Hydroxy (Vit-D Deficiency, Fractures)  B12  deficiency  Current mild episode of major depressive disorder without prior episode (HCC)  Fatigue, unspecified type  Deficiency of other specified B group vitamins  -     Vitamin B12  Abnormal CT of the chest -     CBC with Differential/Platelet  Abnormal thyroid blood test -     CBC with Differential/Platelet -     TSH -     T4, free  Abnormal thyroid exam  Other specified disorders of thyroid  -     TSH  Other orders -     rosuvastatin (CRESTOR) 10 MG tablet; Take 1 tablet (10 mg total) by mouth daily.   Follow up: Pending labs

## 2020-09-22 NOTE — Telephone Encounter (Signed)
After reviewing her chart, have her increase the Crestor to 10 mg daily.  I sent new prescription

## 2020-09-22 NOTE — Telephone Encounter (Signed)
I have called the patient with the results of her CT scan chest. I explained that there is concern regarding the lymphadenopathy noted on the scan. I explained that I have discussed this with Dr. Valeta Harms and that he feels best plan is to do a PET scan and then have a follow up appointment with him to review the results. Based on  PET results a bronch may be needed to evaluate the airway and gather specimens ( Biopsy and culture)  . This will be determined after reviewing the PET scan. Patient is in agreement with this plan. She knows someone will call to get the PET scheduled.  I have ordered the PET scan. Pt. Has the office number if she has any additional questions  Magdalen Spatz, MSN, AGACNP-BC [place] for personal pager PCCM on call pager [phone]  09/22/2020

## 2020-09-22 NOTE — Telephone Encounter (Signed)
Will wait for PET to be scheduled before calling patient.

## 2020-09-22 NOTE — Telephone Encounter (Signed)
Called and spoke with Patient.  Patient given Pet scan date, time, and instructions.  Patient is scheduled f/u appointment with Dr. Valeta Harms 10/01/20 at 1030.  Nothing further at this time.

## 2020-09-22 NOTE — Telephone Encounter (Signed)
Patient informed via my chart.

## 2020-09-22 NOTE — Telephone Encounter (Signed)
This patient will need a follow up appointment with Dr. Valeta Harms once the PET is scheduled. It can be in a nodule slot. Please schedule as soon as possible after PET is scheduled.  The PET has been ordered . Thanks so much

## 2020-09-22 NOTE — Telephone Encounter (Signed)
I actually messaged Dr. Valeta Harms about this yesterday. I am unsure if this is a mucoid impaction vs a bronchogenic cancer. I think she needs a bronch. I am talking with Dr. Valeta Harms now to see if he can bronch and inspect the airway.I will call her after I hear back from Dr. Valeta Harms. I will also let you know the plan. Thanks so much

## 2020-09-22 NOTE — Progress Notes (Unsigned)
Pet

## 2020-09-22 NOTE — Telephone Encounter (Signed)
Hello [name]  I saw our mutual patient Ms. Katrina Campbell today.  She noted that she had a scan with you yesterday and she saw her results on MyChart and was concerned.  We looked over her records.  I told her I would reach out to you about next steps which I am assuming would be referral to oncology for further discussion and evaluation which could possibly include PET scan.  I told her to follow-up with you for further discussion on the results as well, but I did tell her I would reach out to you in case we wanted to move more urgently on oncology consult or other.  Thanks Dorothea Ogle, PA-C

## 2020-09-22 NOTE — Telephone Encounter (Signed)
PET scan sched for 6/28 at 11:00am; arrive at 10:30am @ [facility]. NPO 6hours prior. Did not inform pt.

## 2020-09-23 LAB — CBC WITH DIFFERENTIAL/PLATELET
Basophils Absolute: 0 10*3/uL (ref 0.0–0.2)
Basos: 0 %
EOS (ABSOLUTE): 0.1 10*3/uL (ref 0.0–0.4)
Eos: 1 %
Hematocrit: 41.2 % (ref 34.0–46.6)
Hemoglobin: 13.7 g/dL (ref 11.1–15.9)
Immature Grans (Abs): 0 10*3/uL (ref 0.0–0.1)
Immature Granulocytes: 0 %
Lymphocytes Absolute: 2.1 10*3/uL (ref 0.7–3.1)
Lymphs: 36 %
MCH: 29.5 pg (ref 26.6–33.0)
MCHC: 33.3 g/dL (ref 31.5–35.7)
MCV: 89 fL (ref 79–97)
Monocytes Absolute: 0.4 10*3/uL (ref 0.1–0.9)
Monocytes: 6 %
Neutrophils Absolute: 3.4 10*3/uL (ref 1.4–7.0)
Neutrophils: 57 %
Platelets: 143 10*3/uL — ABNORMAL LOW (ref 150–450)
RBC: 4.64 x10E6/uL (ref 3.77–5.28)
RDW: 12.9 % (ref 11.7–15.4)
WBC: 6 10*3/uL (ref 3.4–10.8)

## 2020-09-23 LAB — TSH: TSH: 3.69 u[IU]/mL (ref 0.450–4.500)

## 2020-09-23 LAB — VITAMIN D 25 HYDROXY (VIT D DEFICIENCY, FRACTURES): Vit D, 25-Hydroxy: 73.3 ng/mL (ref 30.0–100.0)

## 2020-09-23 LAB — VITAMIN B12: Vitamin B-12: 1123 pg/mL (ref 232–1245)

## 2020-09-23 LAB — T4, FREE: Free T4: 1.23 ng/dL (ref 0.82–1.77)

## 2020-09-23 LAB — MAGNESIUM: Magnesium: 2.2 mg/dL (ref 1.6–2.3)

## 2020-09-29 ENCOUNTER — Encounter (HOSPITAL_COMMUNITY)
Admission: RE | Admit: 2020-09-29 | Discharge: 2020-09-29 | Disposition: A | Discharge status: Home / Self Care | Payer: Medicare Other | Source: Ambulatory Visit | Attending: Acute Care | Admitting: Acute Care

## 2020-09-29 ENCOUNTER — Other Ambulatory Visit: Payer: Self-pay

## 2020-09-29 DIAGNOSIS — J439 Emphysema, unspecified: Secondary | ICD-10-CM | POA: Insufficient documentation

## 2020-09-29 DIAGNOSIS — I712 Thoracic aortic aneurysm, without rupture: Secondary | ICD-10-CM | POA: Insufficient documentation

## 2020-09-29 DIAGNOSIS — I2510 Atherosclerotic heart disease of native coronary artery without angina pectoris: Secondary | ICD-10-CM | POA: Insufficient documentation

## 2020-09-29 DIAGNOSIS — R590 Localized enlarged lymph nodes: Secondary | ICD-10-CM

## 2020-09-29 DIAGNOSIS — I700 Atherosclerosis of aorta: Secondary | ICD-10-CM | POA: Insufficient documentation

## 2020-09-29 LAB — GLUCOSE, CAPILLARY: Glucose-Capillary: 105 mg/dL — ABNORMAL HIGH (ref 70–99)

## 2020-09-29 MED ORDER — FLUDEOXYGLUCOSE F - 18 (FDG) INJECTION
6.3700 | Freq: Once | INTRAVENOUS | Status: AC
  Administered 2020-09-29: 6.37 via INTRAVENOUS

## 2020-09-29 NOTE — Telephone Encounter (Signed)
See result note. SG has already addressed. Will close encounter.

## 2020-09-29 NOTE — Progress Notes (Signed)
Telephone note from 09/22/2020 I have called the patient with the results of her CT scan chest. I explained that there is concern regarding the lymphadenopathy noted on the scan. I explained that I have discussed this with Dr. Valeta Harms and that he feels best plan is to do a PET scan and then have a follow up appointment with him to review the results. Based on  PET results a bronch may be needed to evaluate the airway and gather specimens ( Biopsy and culture)  . This will be determined after reviewing the PET scan. Patient is in agreement with this plan. She knows someone will call to get the PET scheduled. I have ordered the PET scan. Pt. Has the office number if she has any additional questions PET scan is scheduled for 6/28, and follow up with Dr. Valeta Harms is scheduled for 10/01/2020 at 10:30 am. Patient has been called and is aware.

## 2020-10-01 ENCOUNTER — Encounter: Payer: Self-pay | Admitting: Pulmonary Disease

## 2020-10-01 ENCOUNTER — Institutional Professional Consult (permissible substitution): Payer: Medicare Other | Admitting: Pulmonary Disease

## 2020-10-01 ENCOUNTER — Other Ambulatory Visit: Payer: Self-pay

## 2020-10-01 ENCOUNTER — Encounter: Payer: Self-pay | Admitting: *Deleted

## 2020-10-01 ENCOUNTER — Telehealth: Payer: Self-pay | Admitting: Pulmonary Disease

## 2020-10-01 ENCOUNTER — Ambulatory Visit (INDEPENDENT_AMBULATORY_CARE_PROVIDER_SITE_OTHER): Payer: Medicare Other | Admitting: Pulmonary Disease

## 2020-10-01 VITALS — BP 136/80 | HR 65 | Temp 98.2°F | Ht 64.0 in | Wt 127.0 lb

## 2020-10-01 DIAGNOSIS — Z87891 Personal history of nicotine dependence: Secondary | ICD-10-CM

## 2020-10-01 DIAGNOSIS — R599 Enlarged lymph nodes, unspecified: Secondary | ICD-10-CM | POA: Diagnosis present

## 2020-10-01 DIAGNOSIS — J479 Bronchiectasis, uncomplicated: Secondary | ICD-10-CM

## 2020-10-01 DIAGNOSIS — R590 Localized enlarged lymph nodes: Secondary | ICD-10-CM | POA: Diagnosis not present

## 2020-10-01 DIAGNOSIS — J449 Chronic obstructive pulmonary disease, unspecified: Secondary | ICD-10-CM

## 2020-10-01 DIAGNOSIS — A310 Pulmonary mycobacterial infection: Secondary | ICD-10-CM

## 2020-10-01 NOTE — Telephone Encounter (Signed)
I spoke to pt & made her aware only covid tests being done in [place] are for procedures being done at [facility].  She states ok.

## 2020-10-01 NOTE — H&P (View-Only) (Signed)
Synopsis: Referred in January 2021 for former smoker quit 2014 moderate COPD, MAI by Carlena Hurl, PA-C  Subjective:   PATIENT ID: Katrina Campbell GENDER: female DOB: 1955-07-14, MRN: [id number]  Chief Complaint  Patient presents with   Results    [name] moderate COPD, former smoker quit 2014, MAI patient last seen in the office by Dr. Vaughan Browner.  Former Dr. Lenna Gilford patient.  Treated for stenotrophomonas in the past.  Prior 3 sputum's AFB, 1/3+ for MAI.  Saw infectious disease to discuss initiation of therapy.  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  Patient was last seen by infectious disease on 04/15/2019.  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  Just started on azithromycin 500 mg daily, rifampin 600 mg daily plus ethambutol 900 mg daily.  Patient does have CT imaging with lower lobe bronchiectasis.  Patient has chronic sputum production.  She is short of breath with daily cough and sputum production.  During this time she is very anxious and hesitant about going out in public due to her chronic cough.  She denies hemoptysis patient's weight has also been stable.  OV 11/06/2019: around the 4th of July patient had congestion and cough that still hasnt really gone away.  Overall doing much better now.  Has less cough and congestion.  Recent follow-up with infectious disease plan for Monday.  Been compliant with her medications.  Still has some daily sputum production.  Rarely has a streak of blood with cough.  OV 04/14/2020: Here today for follow-up.  Establish care with me back last year.  Was already under treatment for her MAI.  Followed up with infectious disease here in [place] as well as met with ID at [facility].  Decision was made to come off of treatments.  Recent sputum's have been negative.  She does have significant  emphysema currently compliant with inhaler regimen.  From a respiratory standpoint she is doing fine.  No significant symptom change after stopping antibiotics.   OV 10/01/2020: Here today for follow-up had abnormal lung cancer screening CT follow-up with an enlarged mediastinal node.  Patient was sent for nuclear medicine PET scan.  PET scan revealed a significantly PET avid station 7 subcarinal node with SUV of 11.  We reviewed this today in the office.  Concerning for underlying malignancy.     Past Medical History:  Diagnosis Date   Anxiety    COPD (chronic obstructive pulmonary disease) (HCC)    Dental staining 07/15/2019   Depression    Hyperlipidemia    Labile hypertension    Pulmonary mycobacterial infection ([place]) 10/24/2018     Family History  Problem Relation Age of Onset   Heart attack Mother    Emphysema Mother    Congestive Heart Failure Mother    Heart attack Father    Congestive Heart Failure Father    Asthma Other    Lung disease Sister    Lung disease Brother    Colon cancer Neg Hx      Past Surgical History:  Procedure Laterality Date   LEFT HEART CATH AND CORONARY ANGIOGRAPHY N/A 11/07/2017   Procedure: LEFT HEART CATH AND CORONARY ANGIOGRAPHY;  Surgeon: Nelva Bush, MD;  Location: [facility];  Service: Cardiovascular;  Laterality: N/A;   NASAL SINUS SURGERY  1986   VIDEO BRONCHOSCOPY Bilateral 01/29/2019   Procedure: VIDEO BRONCHOSCOPY WITH FLUORO;  Surgeon: Marshell Garfinkel, MD;  Location: [facility] END[facility]COPY;  Service: Cardiopulmonary;  Laterality: Bilateral;    Social History   Socioeconomic History   Marital status: Married    Spouse name: Not on file   Number of children: Not on file   Years of education: Not on file   Highest education level: Not on file  Occupational History   Not on file  Tobacco Use   Smoking status: Former    Packs/day: 1.75    Years: 44.00    Pack years: 77.00    Types: Cigarettes    Quit date: 11/09/2012    Years  since quitting: 7.8   Smokeless tobacco: Never   Tobacco comments:    vapor  Vaping Use   Vaping Use: Former  Substance and Sexual Activity   Alcohol use: Not Currently    Alcohol/week: 24.0 standard drinks    Types: 24 Cans of beer per week   Drug use: Not Currently   Sexual activity: Not Currently  Other Topics Concern   Not on file  Social History Narrative   Not on file   Social Determinants of Health   Financial Resource Strain: Not on file  Food Insecurity: Not on file  Transportation Needs: Not on file  Physical Activity: Not on file  Stress: Not on file  Social Connections: Not on file  Intimate Partner Violence: Not on file     No Known Allergies    Outpatient Medications Prior to Visit  Medication Sig Dispense Refill   albuterol (PROAIR HFA) 108 (90 Base) MCG/ACT inhaler Inhale 2 puffs into the lungs every 6 (six) hours as needed for wheezing or shortness of breath. 24 g 0   albuterol (PROVENTIL) (2.5 MG/3ML) 0.083% nebulizer solution Take 3 mLs (2.5 mg total) by nebulization every 6 (six) hours as needed for wheezing or shortness of breath. 540 mL 3   aspirin EC 81 MG tablet Take 81 mg by mouth daily with lunch.      buPROPion (WELLBUTRIN XL) 300 MG 24 hr tablet Take 1 tablet (300 mg total) by mouth daily. 90 tablet 3   fluticasone (FLONASE) 50 MCG/ACT nasal spray Place 1-2 sprays into both nostrils as needed for allergies or rhinitis.     Magnesium 500 MG TABS Take 500 mg by mouth daily with lunch.     Respiratory Therapy Supplies (FLUTTER) DEVI Use as directed 1 each 0   rosuvastatin (CRESTOR) 10 MG tablet Take 1 tablet (10 mg total) by mouth daily. 90 tablet 3   temazepam (RESTORIL) 15 MG capsule TAKE 1 CAPSULE AT BEDTIME AS NEEDED FOR SLEEP (NEED OFFICE VISIT) (Patient taking differently: Take 15 mg by mouth at bedtime.) 90 capsule 0   umeclidinium-vilanterol (ANORO ELLIPTA) 62.5-25 MCG/INH AEPB Inhale 1 puff into the lungs daily. 180 each 3   vitamin B-12  (CYANOCOBALAMIN) 1000 MCG tablet Take 1,000 mcg by mouth daily.     Vitamin D, Ergocalciferol, (DRISDOL) 1.25 MG (50000 UNIT) CAPS capsule Take 50,000 Units by mouth every 7 (seven) days.     Calcium Carb-Cholecalciferol (CALCIUM-VITAMIN D) 500-400 MG-UNIT TABS 1 a day 60 tablet    Coenzyme Q-10 200 MG CAPS Take 200 mg by mouth daily.     guaiFENesin (MUCINEX) 600 MG 12 hr tablet Take 600 mg by mouth daily.     No facility-administered medications prior to visit.    Review of Systems  Constitutional:  Negative for chills, fever, malaise/fatigue and weight loss.  HENT:  Negative for  hearing loss, sore throat and tinnitus.   Eyes:  Negative for blurred vision and double vision.  Respiratory:  Positive for cough, sputum production and shortness of breath. Negative for hemoptysis, wheezing and stridor.   Cardiovascular:  Negative for chest pain, palpitations, orthopnea, leg swelling and PND.  Gastrointestinal:  Negative for abdominal pain, constipation, diarrhea, heartburn, nausea and vomiting.  Genitourinary:  Negative for dysuria, hematuria and urgency.  Musculoskeletal:  Negative for joint pain and myalgias.  Skin:  Negative for itching and rash.  Neurological:  Negative for dizziness, tingling, weakness and headaches.  Endo/Heme/Allergies:  Negative for environmental allergies. Does not bruise/bleed easily.  Psychiatric/Behavioral:  Negative for depression. The patient is nervous/anxious. The patient does not have insomnia.   All other systems reviewed and are negative.   Objective:  Physical Exam Vitals reviewed.  Constitutional:      General: She is not in acute distress.    Appearance: She is well-developed.  HENT:     Head: Normocephalic and atraumatic.  Eyes:     General: No scleral icterus.    Conjunctiva/sclera: Conjunctivae normal.     Pupils: Pupils are equal, round, and reactive to light.  Neck:     Vascular: No JVD.     Trachea: No tracheal deviation.   Cardiovascular:     Rate and Rhythm: Normal rate and regular rhythm.     Heart sounds: Normal heart sounds. No murmur heard. Pulmonary:     Effort: Pulmonary effort is normal. No tachypnea, accessory muscle usage or respiratory distress.     Breath sounds: No stridor. No wheezing, rhonchi or rales.     Comments: Diminished breath sounds bilaterally Abdominal:     General: Bowel sounds are normal. There is no distension.     Palpations: Abdomen is soft.     Tenderness: There is no abdominal tenderness.  Musculoskeletal:        General: No tenderness.     Cervical back: Neck supple.  Lymphadenopathy:     Cervical: No cervical adenopathy.  Skin:    General: Skin is warm and dry.     Capillary Refill: Capillary refill takes less than 2 seconds.     Findings: No rash.  Neurological:     Mental Status: She is alert and oriented to person, place, and time.  Psychiatric:        Behavior: Behavior normal.     Vitals:   10/01/20 1024  BP: 136/80  Pulse: 65  Temp: 98.2 F (36.8 C)  SpO2: 97%  Weight: 127 lb (57.6 kg)  Height: _0  (1.626 m)   97% on RA BMI Readings from Last 3 Encounters:  10/01/20 21.80 kg/m  09/22/20 22.11 kg/m  08/10/20 22.07 kg/m   Wt Readings from Last 3 Encounters:  10/01/20 127 lb (57.6 kg)  09/22/20 128 lb 12.8 oz (58.4 kg)  08/10/20 128 lb 9.6 oz (58.3 kg)     CBC    Component Value Date/Time   WBC 6.0 09/22/2020 1325   WBC 5.4 05/29/2018 1232   RBC 4.64 09/22/2020 1325   RBC 4.78 05/29/2018 1232   HGB 13.7 09/22/2020 1325   HCT 41.2 09/22/2020 1325   PLT 143 (L) 09/22/2020 1325   MCV 89 09/22/2020 1325   MCH 29.5 09/22/2020 1325   MCH 29.1 05/29/2018 1232   MCHC 33.3 09/22/2020 1325   MCHC 31.7 05/29/2018 1232   RDW 12.9 09/22/2020 1325   LYMPHSABS 2.1 09/22/2020 1325   MONOABS 0.5 11/06/2017 2214  EOSABS 0.1 09/22/2020 1325   BASOSABS 0.0 09/22/2020 1325    Chest Imaging:  May 2020 CT chest: Scattered nodules.  Lower  lobe small areas of bronchiectasis. The patient's images have been independently reviewed by me.   PET scan 09/29/2020: SUV of 11, subcarinal adenopathy concerning for malignancy. The patient's images have been independently reviewed by me.    Pulmonary Functions Testing Results: No flowsheet data found.  FeNO: none   Pathology: none   Echocardiogram: none  Heart Catheterization: none     Assessment & Plan:     ICD-10-CM   1. Adenopathy  R59.9 Ambulatory referral to Pulmonology    Procedural/ Surgical Case Request: VIDEO BRONCHOSCOPY WITH ENDOBRONCHIAL ULTRASOUND    2. Lymphadenopathy, mediastinal  R59.0     3. Former smoker  Z87.891     4. Stage 2 moderate COPD by GOLD classification ([place])  J44.9     5. Bronchiectasis without complication ([place])  I43.3     6. MAI (mycobacterium avium-intracellulare) (HCC)  A31.0       Discussion:  65 year old female, moderate COPD FEV1 66% longstanding history of smoking enrolled in our lung cancer screening program.  She does have evidence of emphysema currently managed on a LAMA/LABA.  Off ICS due to MAC history.  From respiratory standpoint doing well.  Her breathing is about stable.  Still using flutter valve and airway clearance techniques.  Recent abnormal CT imaging showed enlarged subcarinal adenopathy.  PET scan with PET avid lesion, SUV of 11.  Plan: We discussed today risk benefits and alternatives of proceeding with video bronchoscopy and ultrasound with transbronchial needle aspiration of subcarinal node. This to help rule out presence of malignancy within the node. I am concerned of its rapid growth from repeat CT scans as well as its PET avid SUV level of 11. We are likely dealing with a malignancy within the mediastinum. In the meantime she needs to continue management of her COPD with her LAMA/LABA Continue vest therapy and flutter valve management for bronchiectasis.  Tentative date plans for bronchoscopy on  10/08/2020.    Current Outpatient Medications:    albuterol (PROAIR HFA) 108 (90 Base) MCG/ACT inhaler, Inhale 2 puffs into the lungs every 6 (six) hours as needed for wheezing or shortness of breath., Disp: 24 g, Rfl: 0   albuterol (PROVENTIL) (2.5 MG/3ML) 0.083% nebulizer solution, Take 3 mLs (2.5 mg total) by nebulization every 6 (six) hours as needed for wheezing or shortness of breath., Disp: 540 mL, Rfl: 3   aspirin EC 81 MG tablet, Take 81 mg by mouth daily with lunch. , Disp: , Rfl:    buPROPion (WELLBUTRIN XL) 300 MG 24 hr tablet, Take 1 tablet (300 mg total) by mouth daily., Disp: 90 tablet, Rfl: 3   fluticasone (FLONASE) 50 MCG/ACT nasal spray, Place 1-2 sprays into both nostrils as needed for allergies or rhinitis., Disp: , Rfl:    Magnesium 500 MG TABS, Take 500 mg by mouth daily with lunch., Disp: , Rfl:    Respiratory Therapy Supplies (FLUTTER) DEVI, Use as directed, Disp: 1 each, Rfl: 0   rosuvastatin (CRESTOR) 10 MG tablet, Take 1 tablet (10 mg total) by mouth daily., Disp: 90 tablet, Rfl: 3   temazepam (RESTORIL) 15 MG capsule, TAKE 1 CAPSULE AT BEDTIME AS NEEDED FOR SLEEP (NEED OFFICE VISIT) (Patient taking differently: Take 15 mg by mouth at bedtime.), Disp: 90 capsule, Rfl: 0   umeclidinium-vilanterol (ANORO ELLIPTA) 62.5-25 MCG/INH AEPB, Inhale 1 puff into the lungs  daily., Disp: 180 each, Rfl: 3   vitamin B-12 (CYANOCOBALAMIN) 1000 MCG tablet, Take 1,000 mcg by mouth daily., Disp: , Rfl:    Vitamin D, Ergocalciferol, (DRISDOL) 1.25 MG (50000 UNIT) CAPS capsule, Take 50,000 Units by mouth every 7 (seven) days., Disp: , Rfl:    Cholecalciferol (DIALYVITE VITAMIN D 5000) 125 MCG (5000 UT) capsule, Take 5,000 Units by mouth daily., Disp: , Rfl:    Coenzyme Q10 (COQ-10) 100 MG CAPS, Take 100 mg by mouth daily., Disp: , Rfl:    Guaifenesin (MUCINEX MAXIMUM STRENGTH) 1200 MG TB12, Take 1,200 mg by mouth daily., Disp: , Rfl:    Multiple Vitamins-Minerals (IMMUNE SUPPORT PO), Take 2  tablets by mouth daily., Disp: , Rfl:   I spent 42 minutes dedicated to the care of this patient on the date of this encounter to include pre-visit review of records, face-to-face time with the patient discussing conditions above, post visit ordering of testing, clinical documentation with the electronic health record, making appropriate referrals as documented, and communicating necessary findings to members of the patients care team.    Garner Nash, DO [facility] 10/01/2020 12:55 PM

## 2020-10-01 NOTE — Progress Notes (Signed)
 Synopsis: Referred in January 2021 for former smoker quit 2014 moderate COPD, MAI by Tysinger, David S, PA-C  Subjective:   PATIENT ID: Katrina Campbell GENDER: female DOB: 07/05/1955, MRN: [id number]  Chief Complaint  Patient presents with   Results    This is a 65-year-old female moderate COPD, former smoker quit 2014, MAI patient last seen in the office by Dr. Mannam.  Former Dr. Nadel patient.  Treated for stenotrophomonas in the past.  Prior 3 sputum's AFB, 1/3+ for MAI.  Saw infectious disease to discuss initiation of therapy.  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  Patient was last seen by infectious disease on 04/15/2019.  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  Just started on azithromycin 500 mg daily, rifampin 600 mg daily plus ethambutol 900 mg daily.  Patient does have CT imaging with lower lobe bronchiectasis.  Patient has chronic sputum production.  She is short of breath with daily cough and sputum production.  During this time she is very anxious and hesitant about going out in public due to her chronic cough.  She denies hemoptysis patient's weight has also been stable.  OV 11/06/2019: around the 4th of July patient had congestion and cough that still hasnt really gone away.  Overall doing much better now.  Has less cough and congestion.  Recent follow-up with infectious disease plan for Monday.  Been compliant with her medications.  Still has some daily sputum production.  Rarely has a streak of blood with cough.  OV 04/14/2020: Here today for follow-up.  Establish care with me back last year.  Was already under treatment for her MAI.  Followed up with infectious disease here in [place] as well as met with ID at [facility].  Decision was made to come off of treatments.  Recent sputum's have been negative.  She does have significant  emphysema currently compliant with inhaler regimen.  From a respiratory standpoint she is doing fine.  No significant symptom change after stopping antibiotics.   OV 10/01/2020: Here today for follow-up had abnormal lung cancer screening CT follow-up with an enlarged mediastinal node.  Patient was sent for nuclear medicine PET scan.  PET scan revealed a significantly PET avid station 7 subcarinal node with SUV of 11.  We reviewed this today in the office.  Concerning for underlying malignancy.     Past Medical History:  Diagnosis Date   Anxiety    COPD (chronic obstructive pulmonary disease) (HCC)    Dental staining 07/15/2019   Depression    Hyperlipidemia    Labile hypertension    Pulmonary mycobacterial infection (HCC) 10/24/2018     Family History  Problem Relation Age of Onset   Heart attack Mother    Emphysema Mother    Congestive Heart Failure Mother    Heart attack Father    Congestive Heart Failure Father    Asthma Other    Lung disease Sister    Lung disease Brother    Colon cancer Neg Hx      Past Surgical History:  Procedure Laterality Date   LEFT HEART CATH AND CORONARY ANGIOGRAPHY N/A 11/07/2017   Procedure: LEFT HEART CATH AND CORONARY ANGIOGRAPHY;  Surgeon: End, Christopher, MD;  Location: [facility];  Service: Cardiovascular;  Laterality: N/A;   NASAL SINUS SURGERY  1986   VIDEO BRONCHOSCOPY Bilateral 01/29/2019   Procedure: VIDEO BRONCHOSCOPY WITH FLUORO;    Surgeon: Mannam, Praveen, MD;  Location: [facility] END[facility];  Service: Cardiopulmonary;  Laterality: Bilateral;    Social History   Socioeconomic History   Marital status: Married    Spouse name: Not on file   Number of children: Not on file   Years of education: Not on file   Highest education level: Not on file  Occupational History   Not on file  Tobacco Use   Smoking status: Former    Packs/day: 1.75    Years: 44.00    Pack years: 77.00    Types: Cigarettes    Quit date: 11/09/2012    Years  since quitting: 7.8   Smokeless tobacco: Never   Tobacco comments:    vapor  Vaping Use   Vaping Use: Former  Substance and Sexual Activity   Alcohol use: Not Currently    Alcohol/week: 24.0 standard drinks    Types: 24 Cans of beer per week   Drug use: Not Currently   Sexual activity: Not Currently  Other Topics Concern   Not on file  Social History Narrative   Not on file   Social Determinants of Health   Financial Resource Strain: Not on file  Food Insecurity: Not on file  Transportation Needs: Not on file  Physical Activity: Not on file  Stress: Not on file  Social Connections: Not on file  Intimate Partner Violence: Not on file     No Known Allergies    Outpatient Medications Prior to Visit  Medication Sig Dispense Refill   albuterol (PROAIR HFA) 108 (90 Base) MCG/ACT inhaler Inhale 2 puffs into the lungs every 6 (six) hours as needed for wheezing or shortness of breath. 24 g 0   albuterol (PROVENTIL) (2.5 MG/3ML) 0.083% nebulizer solution Take 3 mLs (2.5 mg total) by nebulization every 6 (six) hours as needed for wheezing or shortness of breath. 540 mL 3   aspirin EC 81 MG tablet Take 81 mg by mouth daily with lunch.      buPROPion (WELLBUTRIN XL) 300 MG 24 hr tablet Take 1 tablet (300 mg total) by mouth daily. 90 tablet 3   fluticasone (FLONASE) 50 MCG/ACT nasal spray Place 1-2 sprays into both nostrils as needed for allergies or rhinitis.     Magnesium 500 MG TABS Take 500 mg by mouth daily with lunch.     Respiratory Therapy Supplies (FLUTTER) DEVI Use as directed 1 each 0   rosuvastatin (CRESTOR) 10 MG tablet Take 1 tablet (10 mg total) by mouth daily. 90 tablet 3   temazepam (RESTORIL) 15 MG capsule TAKE 1 CAPSULE AT BEDTIME AS NEEDED FOR SLEEP (NEED OFFICE VISIT) (Patient taking differently: Take 15 mg by mouth at bedtime.) 90 capsule 0   umeclidinium-vilanterol (ANORO ELLIPTA) 62.5-25 MCG/INH AEPB Inhale 1 puff into the lungs daily. 180 each 3   vitamin B-12  (CYANOCOBALAMIN) 1000 MCG tablet Take 1,000 mcg by mouth daily.     Vitamin D, Ergocalciferol, (DRISDOL) 1.25 MG (50000 UNIT) CAPS capsule Take 50,000 Units by mouth every 7 (seven) days.     Calcium Carb-Cholecalciferol (CALCIUM-VITAMIN D) 500-400 MG-UNIT TABS 1 a day 60 tablet    Coenzyme Q-10 200 MG CAPS Take 200 mg by mouth daily.     guaiFENesin (MUCINEX) 600 MG 12 hr tablet Take 600 mg by mouth daily.     No facility-administered medications prior to visit.    Review of Systems  Constitutional:  Negative for chills, fever, malaise/fatigue and weight loss.  HENT:  Negative for   hearing loss, sore throat and tinnitus.   Eyes:  Negative for blurred vision and double vision.  Respiratory:  Positive for cough, sputum production and shortness of breath. Negative for hemoptysis, wheezing and stridor.   Cardiovascular:  Negative for chest pain, palpitations, orthopnea, leg swelling and PND.  Gastrointestinal:  Negative for abdominal pain, constipation, diarrhea, heartburn, nausea and vomiting.  Genitourinary:  Negative for dysuria, hematuria and urgency.  Musculoskeletal:  Negative for joint pain and myalgias.  Skin:  Negative for itching and rash.  Neurological:  Negative for dizziness, tingling, weakness and headaches.  Endo/Heme/Allergies:  Negative for environmental allergies. Does not bruise/bleed easily.  Psychiatric/Behavioral:  Negative for depression. The patient is nervous/anxious. The patient does not have insomnia.   All other systems reviewed and are negative.   Objective:  Physical Exam Vitals reviewed.  Constitutional:      General: She is not in acute distress.    Appearance: She is well-developed.  HENT:     Head: Normocephalic and atraumatic.  Eyes:     General: No scleral icterus.    Conjunctiva/sclera: Conjunctivae normal.     Pupils: Pupils are equal, round, and reactive to light.  Neck:     Vascular: No JVD.     Trachea: No tracheal deviation.   Cardiovascular:     Rate and Rhythm: Normal rate and regular rhythm.     Heart sounds: Normal heart sounds. No murmur heard. Pulmonary:     Effort: Pulmonary effort is normal. No tachypnea, accessory muscle usage or respiratory distress.     Breath sounds: No stridor. No wheezing, rhonchi or rales.     Comments: Diminished breath sounds bilaterally Abdominal:     General: Bowel sounds are normal. There is no distension.     Palpations: Abdomen is soft.     Tenderness: There is no abdominal tenderness.  Musculoskeletal:        General: No tenderness.     Cervical back: Neck supple.  Lymphadenopathy:     Cervical: No cervical adenopathy.  Skin:    General: Skin is warm and dry.     Capillary Refill: Capillary refill takes less than 2 seconds.     Findings: No rash.  Neurological:     Mental Status: She is alert and oriented to person, place, and time.  Psychiatric:        Behavior: Behavior normal.     Vitals:   10/01/20 1024  BP: 136/80  Pulse: 65  Temp: 98.2 F (36.8 C)  SpO2: 97%  Weight: 127 lb (57.6 kg)  Height: 5' 4" (1.626 m)   97% on RA BMI Readings from Last 3 Encounters:  10/01/20 21.80 kg/m  09/22/20 22.11 kg/m  08/10/20 22.07 kg/m   Wt Readings from Last 3 Encounters:  10/01/20 127 lb (57.6 kg)  09/22/20 128 lb 12.8 oz (58.4 kg)  08/10/20 128 lb 9.6 oz (58.3 kg)     CBC    Component Value Date/Time   WBC 6.0 09/22/2020 1325   WBC 5.4 05/29/2018 1232   RBC 4.64 09/22/2020 1325   RBC 4.78 05/29/2018 1232   HGB 13.7 09/22/2020 1325   HCT 41.2 09/22/2020 1325   PLT 143 (L) 09/22/2020 1325   MCV 89 09/22/2020 1325   MCH 29.5 09/22/2020 1325   MCH 29.1 05/29/2018 1232   MCHC 33.3 09/22/2020 1325   MCHC 31.7 05/29/2018 1232   RDW 12.9 09/22/2020 1325   LYMPHSABS 2.1 09/22/2020 1325   MONOABS 0.5 11/06/2017 2214     EOSABS 0.1 09/22/2020 1325   BASOSABS 0.0 09/22/2020 1325    Chest Imaging:  May 2020 CT chest: Scattered nodules.  Lower  lobe small areas of bronchiectasis. The patient's images have been independently reviewed by me.   PET scan 09/29/2020: SUV of 11, subcarinal adenopathy concerning for malignancy. The patient's images have been independently reviewed by me.    Pulmonary Functions Testing Results: No flowsheet data found.  FeNO: none   Pathology: none   Echocardiogram: none  Heart Catheterization: none     Assessment & Plan:     ICD-10-CM   1. Adenopathy  R59.9 Ambulatory referral to Pulmonology    Procedural/ Surgical Case Request: VIDEO BRONCHOSCOPY WITH ENDOBRONCHIAL ULTRASOUND    2. Lymphadenopathy, mediastinal  R59.0     3. Former smoker  Z87.891     4. Stage 2 moderate COPD by GOLD classification ([place])  J44.9     5. Bronchiectasis without complication ([place])  M84.1     6. MAI (mycobacterium avium-intracellulare) (HCC)  A31.0       Discussion:  65 year old female, moderate COPD FEV1 66% longstanding history of smoking enrolled in our lung cancer screening program.  She does have evidence of emphysema currently managed on a LAMA/LABA.  Off ICS due to MAC history.  From respiratory standpoint doing well.  Her breathing is about stable.  Still using flutter valve and airway clearance techniques.  Recent abnormal CT imaging showed enlarged subcarinal adenopathy.  PET scan with PET avid lesion, SUV of 11.  Plan: We discussed today risk benefits and alternatives of proceeding with video bronchoscopy and ultrasound with transbronchial needle aspiration of subcarinal node. This to help rule out presence of malignancy within the node. I am concerned of its rapid growth from repeat CT scans as well as its PET avid SUV level of 11. We are likely dealing with a malignancy within the mediastinum. In the meantime she needs to continue management of her COPD with her LAMA/LABA Continue vest therapy and flutter valve management for bronchiectasis.  Tentative date plans for bronchoscopy on  10/08/2020.    Current Outpatient Medications:    albuterol (PROAIR HFA) 108 (90 Base) MCG/ACT inhaler, Inhale 2 puffs into the lungs every 6 (six) hours as needed for wheezing or shortness of breath., Disp: 24 g, Rfl: 0   albuterol (PROVENTIL) (2.5 MG/3ML) 0.083% nebulizer solution, Take 3 mLs (2.5 mg total) by nebulization every 6 (six) hours as needed for wheezing or shortness of breath., Disp: 540 mL, Rfl: 3   aspirin EC 81 MG tablet, Take 81 mg by mouth daily with lunch. , Disp: , Rfl:    buPROPion (WELLBUTRIN XL) 300 MG 24 hr tablet, Take 1 tablet (300 mg total) by mouth daily., Disp: 90 tablet, Rfl: 3   fluticasone (FLONASE) 50 MCG/ACT nasal spray, Place 1-2 sprays into both nostrils as needed for allergies or rhinitis., Disp: , Rfl:    Magnesium 500 MG TABS, Take 500 mg by mouth daily with lunch., Disp: , Rfl:    Respiratory Therapy Supplies (FLUTTER) DEVI, Use as directed, Disp: 1 each, Rfl: 0   rosuvastatin (CRESTOR) 10 MG tablet, Take 1 tablet (10 mg total) by mouth daily., Disp: 90 tablet, Rfl: 3   temazepam (RESTORIL) 15 MG capsule, TAKE 1 CAPSULE AT BEDTIME AS NEEDED FOR SLEEP (NEED OFFICE VISIT) (Patient taking differently: Take 15 mg by mouth at bedtime.), Disp: 90 capsule, Rfl: 0   umeclidinium-vilanterol (ANORO ELLIPTA) 62.5-25 MCG/INH AEPB, Inhale 1 puff into the lungs  daily., Disp: 180 each, Rfl: 3   vitamin B-12 (CYANOCOBALAMIN) 1000 MCG tablet, Take 1,000 mcg by mouth daily., Disp: , Rfl:    Vitamin D, Ergocalciferol, (DRISDOL) 1.25 MG (50000 UNIT) CAPS capsule, Take 50,000 Units by mouth every 7 (seven) days., Disp: , Rfl:    Cholecalciferol (DIALYVITE VITAMIN D 5000) 125 MCG (5000 UT) capsule, Take 5,000 Units by mouth daily., Disp: , Rfl:    Coenzyme Q10 (COQ-10) 100 MG CAPS, Take 100 mg by mouth daily., Disp: , Rfl:    Guaifenesin (MUCINEX MAXIMUM STRENGTH) 1200 MG TB12, Take 1,200 mg by mouth daily., Disp: , Rfl:    Multiple Vitamins-Minerals (IMMUNE SUPPORT PO), Take 2  tablets by mouth daily., Disp: , Rfl:   I spent 42 minutes dedicated to the care of this patient on the date of this encounter to include pre-visit review of records, face-to-face time with the patient discussing conditions above, post visit ordering of testing, clinical documentation with the electronic health record, making appropriate referrals as documented, and communicating necessary findings to members of the patients care team.    Zonie Crutcher L Sakib Noguez, DO [facility] 10/01/2020 12:55 PM   

## 2020-10-01 NOTE — Telephone Encounter (Signed)
Patient would like to have Covid 19 test at [facility] in [place]. Patient phone number is [phone].

## 2020-10-01 NOTE — Telephone Encounter (Signed)
EBUS scheduled for 7/7 at 8:30 at [facility].  Covid test scheduled for 7/5 at 9:25.  Gave appt info to pt.

## 2020-10-01 NOTE — Progress Notes (Signed)
These results will be discussed with the patient in clinic today at 10:30 when the patient has her appointment with Dr. Valeta Harms. Please see OV dated 10/01/2020.

## 2020-10-06 ENCOUNTER — Encounter (HOSPITAL_COMMUNITY): Payer: Self-pay | Admitting: Pulmonary Disease

## 2020-10-06 ENCOUNTER — Other Ambulatory Visit: Payer: Self-pay

## 2020-10-06 ENCOUNTER — Other Ambulatory Visit (HOSPITAL_COMMUNITY)
Admission: RE | Admit: 2020-10-06 | Discharge: 2020-10-06 | Disposition: A | Discharge status: Home / Self Care | Payer: Medicare Other | Source: Ambulatory Visit | Attending: Pulmonary Disease | Admitting: Pulmonary Disease

## 2020-10-06 DIAGNOSIS — Z01812 Encounter for preprocedural laboratory examination: Secondary | ICD-10-CM | POA: Diagnosis present

## 2020-10-06 DIAGNOSIS — Z20822 Contact with and (suspected) exposure to covid-19: Secondary | ICD-10-CM | POA: Insufficient documentation

## 2020-10-06 NOTE — Progress Notes (Signed)
Spoke with pt for pre-op call. Pt states she has CAD and an Aortic aneurysm and is seen by Dr. Meda Coffee. Denies any recent chest pain. Pt is treated for HTN, but states she is not diabetic.  Covid test done today. She denies any recent Covid symptoms.

## 2020-10-07 LAB — SARS CORONAVIRUS 2 (TAT 6-24 HRS): SARS Coronavirus 2: NEGATIVE

## 2020-10-08 ENCOUNTER — Ambulatory Visit (HOSPITAL_COMMUNITY): Payer: Medicare Other

## 2020-10-08 ENCOUNTER — Other Ambulatory Visit: Payer: Self-pay

## 2020-10-08 ENCOUNTER — Ambulatory Visit (HOSPITAL_COMMUNITY): Payer: Medicare Other | Admitting: Anesthesiology

## 2020-10-08 ENCOUNTER — Encounter (HOSPITAL_COMMUNITY): Payer: Self-pay | Admitting: Pulmonary Disease

## 2020-10-08 ENCOUNTER — Encounter (HOSPITAL_COMMUNITY)
Admission: RE | Disposition: A | Discharge status: Home / Self Care | Payer: Self-pay | Source: Home / Self Care | Attending: Pulmonary Disease

## 2020-10-08 ENCOUNTER — Ambulatory Visit (HOSPITAL_COMMUNITY)
Admission: RE | Admit: 2020-10-08 | Discharge: 2020-10-08 | Disposition: A | Discharge status: Home / Self Care | Payer: Medicare Other | Attending: Pulmonary Disease | Admitting: Pulmonary Disease

## 2020-10-08 DIAGNOSIS — R599 Enlarged lymph nodes, unspecified: Secondary | ICD-10-CM | POA: Diagnosis present

## 2020-10-08 DIAGNOSIS — Z825 Family history of asthma and other chronic lower respiratory diseases: Secondary | ICD-10-CM | POA: Insufficient documentation

## 2020-10-08 DIAGNOSIS — I10 Essential (primary) hypertension: Secondary | ICD-10-CM | POA: Insufficient documentation

## 2020-10-08 DIAGNOSIS — I509 Heart failure, unspecified: Secondary | ICD-10-CM

## 2020-10-08 DIAGNOSIS — C3491 Malignant neoplasm of unspecified part of right bronchus or lung: Secondary | ICD-10-CM | POA: Diagnosis not present

## 2020-10-08 DIAGNOSIS — Z87891 Personal history of nicotine dependence: Secondary | ICD-10-CM | POA: Diagnosis not present

## 2020-10-08 DIAGNOSIS — Z8249 Family history of ischemic heart disease and other diseases of the circulatory system: Secondary | ICD-10-CM | POA: Insufficient documentation

## 2020-10-08 DIAGNOSIS — J449 Chronic obstructive pulmonary disease, unspecified: Secondary | ICD-10-CM | POA: Diagnosis not present

## 2020-10-08 DIAGNOSIS — C3401 Malignant neoplasm of right main bronchus: Secondary | ICD-10-CM | POA: Diagnosis not present

## 2020-10-08 DIAGNOSIS — C771 Secondary and unspecified malignant neoplasm of intrathoracic lymph nodes: Secondary | ICD-10-CM | POA: Diagnosis not present

## 2020-10-08 DIAGNOSIS — R590 Localized enlarged lymph nodes: Secondary | ICD-10-CM | POA: Diagnosis present

## 2020-10-08 HISTORY — PX: VIDEO BRONCHOSCOPY WITH ENDOBRONCHIAL ULTRASOUND: SHX6177

## 2020-10-08 HISTORY — PX: FINE NEEDLE ASPIRATION: SHX5430

## 2020-10-08 HISTORY — PX: BRONCHIAL BIOPSY: SHX5109

## 2020-10-08 HISTORY — PX: BRONCHIAL WASHINGS: SHX5105

## 2020-10-08 HISTORY — DX: Pneumonia, unspecified organism: J18.9

## 2020-10-08 HISTORY — PX: BRONCHIAL BRUSHINGS: SHX5108

## 2020-10-08 HISTORY — DX: Atherosclerotic heart disease of native coronary artery without angina pectoris: I25.10

## 2020-10-08 HISTORY — DX: Acute myocardial infarction, unspecified: I21.9

## 2020-10-08 LAB — BASIC METABOLIC PANEL
Anion gap: 6 (ref 5–15)
BUN: 8 mg/dL (ref 8–23)
CO2: 24 mmol/L (ref 22–32)
Calcium: 8.7 mg/dL — ABNORMAL LOW (ref 8.9–10.3)
Chloride: 111 mmol/L (ref 98–111)
Creatinine, Ser: 0.66 mg/dL (ref 0.44–1.00)
GFR, Estimated: >60 mL/min (ref >60)
Glucose, Bld: 93 mg/dL (ref 70–99)
Potassium: 3.9 mmol/L (ref 3.5–5.1)
Sodium: 141 mmol/L (ref 135–145)

## 2020-10-08 SURGERY — BRONCHOSCOPY, WITH EBUS
Anesthesia: General

## 2020-10-08 MED ORDER — SODIUM CHLORIDE (PF) 0.9 % IJ SOLN
PREFILLED_SYRINGE | INTRAMUSCULAR | Status: DC | PRN
  Administered 2020-10-08: 8 mL

## 2020-10-08 MED ORDER — DEXAMETHASONE SODIUM PHOSPHATE 10 MG/ML IJ SOLN
INTRAMUSCULAR | Status: DC | PRN
  Administered 2020-10-08: 5 mg via INTRAVENOUS

## 2020-10-08 MED ORDER — PROPOFOL 10 MG/ML IV BOLUS
INTRAVENOUS | Status: DC | PRN
  Administered 2020-10-08: 130 mg via INTRAVENOUS

## 2020-10-08 MED ORDER — KETOROLAC TROMETHAMINE 30 MG/ML IJ SOLN
INTRAMUSCULAR | Status: AC
  Filled 2020-10-08: qty 1

## 2020-10-08 MED ORDER — LIDOCAINE 2% (20 MG/ML) 5 ML SYRINGE
INTRAMUSCULAR | Status: DC | PRN
  Administered 2020-10-08: 60 mg via INTRAVENOUS

## 2020-10-08 MED ORDER — SUGAMMADEX SODIUM 200 MG/2ML IV SOLN
INTRAVENOUS | Status: DC | PRN
  Administered 2020-10-08: 180 mg via INTRAVENOUS

## 2020-10-08 MED ORDER — LACTATED RINGERS IV SOLN: INTRAVENOUS | Status: DC

## 2020-10-08 MED ORDER — CHLORHEXIDINE GLUCONATE 0.12 % MT SOLN
OROMUCOSAL | Status: AC
  Administered 2020-10-08: 15 mL
  Filled 2020-10-08: qty 15

## 2020-10-08 MED ORDER — ACETAMINOPHEN 500 MG PO TABS
1000.0000 mg | ORAL_TABLET | Freq: Once | ORAL | Status: AC
  Administered 2020-10-08: 1000 mg via ORAL
  Filled 2020-10-08: qty 2

## 2020-10-08 MED ORDER — PHENYLEPHRINE 40 MCG/ML (10ML) SYRINGE FOR IV PUSH (FOR BLOOD PRESSURE SUPPORT)
PREFILLED_SYRINGE | INTRAVENOUS | Status: DC | PRN
  Administered 2020-10-08 (×2): 120 ug via INTRAVENOUS

## 2020-10-08 MED ORDER — FENTANYL CITRATE (PF) 100 MCG/2ML IJ SOLN: 25.0000 ug | INTRAMUSCULAR | Status: DC | PRN

## 2020-10-08 MED ORDER — FENTANYL CITRATE (PF) 250 MCG/5ML IJ SOLN
INTRAMUSCULAR | Status: DC | PRN
  Administered 2020-10-08: 100 ug via INTRAVENOUS

## 2020-10-08 MED ORDER — ONDANSETRON HCL 4 MG/2ML IJ SOLN
INTRAMUSCULAR | Status: DC | PRN
  Administered 2020-10-08: 4 mg via INTRAVENOUS

## 2020-10-08 MED ORDER — MIDAZOLAM HCL 2 MG/2ML IJ SOLN
INTRAMUSCULAR | Status: DC | PRN
  Administered 2020-10-08: 2 mg via INTRAVENOUS

## 2020-10-08 MED ORDER — EPINEPHRINE 1 MG/10ML IJ SOSY
PREFILLED_SYRINGE | INTRAMUSCULAR | Status: AC
  Filled 2020-10-08: qty 10

## 2020-10-08 MED ORDER — ROCURONIUM BROMIDE 10 MG/ML (PF) SYRINGE
PREFILLED_SYRINGE | INTRAVENOUS | Status: DC | PRN
  Administered 2020-10-08: 40 mg via INTRAVENOUS

## 2020-10-08 SURGICAL SUPPLY — 29 items

## 2020-10-08 NOTE — Anesthesia Preprocedure Evaluation (Addendum)
Anesthesia Evaluation  Patient identified by MRN, date of birth, ID band Patient awake    Reviewed: Allergy & Precautions, H&P , NPO status , Patient's Chart, lab work & pertinent test results  Airway Mallampati: II  TM Distance: >3 FB Neck ROM: Full    Dental no notable dental hx. (+) Teeth Intact, Dental Advisory Given   Pulmonary COPD,  COPD inhaler, former smoker,    Pulmonary exam normal breath sounds clear to auscultation       Cardiovascular hypertension, + CAD, + Past MI and + DOE   Rhythm:Regular Rate:Normal     Neuro/Psych Anxiety Depression negative neurological ROS     GI/Hepatic negative GI ROS, Neg liver ROS,   Endo/Other  negative endocrine ROS  Renal/GU negative Renal ROS  negative genitourinary   Musculoskeletal   Abdominal   Peds  Hematology negative hematology ROS (+)   Anesthesia Other Findings   Reproductive/Obstetrics                            Anesthesia Physical Anesthesia Plan  ASA: 3  Anesthesia Plan: General   Post-op Pain Management:    Induction: Intravenous  PONV Risk Score and Plan: 4 or greater and Ondansetron, Dexamethasone and Midazolam  Airway Management Planned: Oral ETT  Additional Equipment:   Intra-op Plan:   Post-operative Plan: Extubation in OR  Informed Consent: I have reviewed the patients History and Physical, chart, labs and discussed the procedure including the risks, benefits and alternatives for the proposed anesthesia with the patient or authorized representative who has indicated his/her understanding and acceptance.     Dental advisory given  Plan Discussed with: CRNA  Anesthesia Plan Comments:         Anesthesia Quick Evaluation

## 2020-10-08 NOTE — Anesthesia Procedure Notes (Signed)
Procedure Name: Intubation Date/Time: 10/08/2020 9:47 AM Performed by: Bryson Corona, CRNA Pre-anesthesia Checklist: Patient identified, Emergency Drugs available, Suction available and Patient being monitored Patient Re-evaluated:Patient Re-evaluated prior to induction Oxygen Delivery Method: Circle System Utilized Preoxygenation: Pre-oxygenation with 100% oxygen Induction Type: IV induction Ventilation: Mask ventilation without difficulty Laryngoscope Size: Mac and 3 Grade View: Grade I Tube type: Oral Tube size: 8.5 mm Number of attempts: 1 Airway Equipment and Method: Stylet and Oral airway Placement Confirmation: ETT inserted through vocal cords under direct vision, positive ETCO2 and breath sounds checked- equal and bilateral Secured at: 23 cm Tube secured with: Tape Dental Injury: Teeth and Oropharynx as per pre-operative assessment

## 2020-10-08 NOTE — Interval H&P Note (Signed)
History and Physical Interval Note:  10/08/2020 7:32 AM  Katrina Campbell  has presented today for surgery, with the diagnosis of adenopathy.  The various methods of treatment have been discussed with the patient and family. After consideration of risks, benefits and other options for treatment, the patient has consented to  Procedure(s): [place] (N/A) as a surgical intervention.  The patient's history has been reviewed, patient examined, no change in status, stable for surgery.  I have reviewed the patient's chart and labs.  Questions were answered to the patient's satisfaction.     [place]

## 2020-10-08 NOTE — Op Note (Signed)
Video Bronchoscopy with Endobronchial Ultrasound Procedure Note  Date of Operation: 10/08/2020  Pre-op Diagnosis: Mediastinal adenopathy  Post-op Diagnosis: Mediastinal adenopathy, endobronchial cancer  Surgeon: Garner Nash, DO   Assistants: None   Anesthesia: General endotracheal anesthesia  Operation: Flexible video fiberoptic bronchoscopy with endobronchial ultrasound and biopsies.  Estimated Blood Loss: Minimal  Complications: None   Indications and History: Latorie Montesano is a 65 y.o. female with Mediastinal adenopathy, abnormal PET.  The risks, benefits, complications, treatment options and expected outcomes were discussed with the patient.  The possibilities of pneumothorax, pneumonia, reaction to medication, pulmonary aspiration, perforation of a viscus, bleeding, failure to diagnose a condition and creating a complication requiring transfusion or operation were discussed with the patient who freely signed the consent.    Description of Procedure: The patient was examined in the preoperative area and history and data from the preprocedure consultation were reviewed. It was deemed appropriate to proceed.  The patient was taken to [facility] Endoscopy Room 2, identified as [facility] and the procedure verified as Flexible Video Fiberoptic Bronchoscopy.  A Time Out was held and the above information confirmed. After being taken to the operating room general anesthesia was initiated and the patient  was orally intubated. The video fiberoptic bronchoscope was introduced via the endotracheal tube and a general inspection was performed which showed normal right and left lung anatomy, right mainstem medial wall visible tumor eroding from the right mainstem wall, irregular mucosa.. The standard scope was then withdrawn and the endobronchial ultrasound was used to identify and characterize the peritracheal, hilar and bronchial lymph nodes. Inspection showed enlarged subcarinal adenopathy. Using  real-time ultrasound guidance Wang needle biopsies were take from Station 7 nodes and were sent for cytology.  Following sampling of the station 7 lymph node we then turned our attention to the endobronchial disease visible along the right mainstem wall.  Patient found to have tumor eroding from the medial aspect of the right mainstem tracking down to just superior to the opening of the right middle lobe.  We completed endobronchial brushings as well as endobronchial forcep biopsies of the abnormal mucosa and visible tumor.  The area was washed with saline and collected for cytology of the right mainstem bronchial washings.  There was still some oozing along the tumor bed after biopsy and this area was coated with 1: 10,000 dilution of epinephrine. The patient tolerated the procedure well without apparent complications. There was no significant blood loss. The bronchoscope was withdrawn. Anesthesia was reversed and the patient was taken to the PACU for recovery.   Samples: 1. Wang needle biopsies from station 7 node 2.  Right mainstem forcep biopsies 3.  Right mainstem cytology brushings 4.  Right mainstem bronchial washings for cytology  Plans:  The patient will be discharged from the PACU to home when recovered from anesthesia. We will review the cytology, pathology and microbiology results with the patient when they become available. Outpatient followup will be with Garner Nash, [place] Salina Stanfield, DO [facility] 10/08/2020 10:19 AM

## 2020-10-08 NOTE — Discharge Instructions (Signed)
Flexible Bronchoscopy, Care After This sheet gives you information about how to care for yourself after your test. Your doctor may also give you more specific instructions. If you have problems or questions, contact your doctor. Follow these instructions at home: Eating and drinking Do not eat or drink anything (not even water) for 2 hours after your test, or until your numbing medicine (local anesthetic) wears off. When your numbness is gone and your cough and gag reflexes have come back, you may: Eat only soft foods. Slowly drink liquids. The day after the test, go back to your normal diet. Driving Do not drive for 24 hours if you were given a medicine to help you relax (sedative). Do not drive or use heavy machinery while taking prescription pain medicine. General instructions  Take over-the-counter and prescription medicines only as told by your doctor. Return to your normal activities as told. Ask what activities are safe for you. Do not use any products that have nicotine or tobacco in them. This includes cigarettes and e-cigarettes. If you need help quitting, ask your doctor. Keep all follow-up visits as told by your doctor. This is important. It is very important if you had a tissue sample (biopsy) taken. Get help right away if: You have shortness of breath that gets worse. You get light-headed. You feel like you are going to pass out (faint). You have chest pain. You cough up: More than a little blood. More blood than before. Summary Do not eat or drink anything (not even water) for 2 hours after your test, or until your numbing medicine wears off. Do not use cigarettes. Do not use e-cigarettes. Get help right away if you have chest pain.  This information is not intended to replace advice given to you by your health care provider. Make sure you discuss any questions you have with your health care provider. Document Released: 01/16/2009 Document Revised: 03/03/2017 Document  Reviewed: 04/08/2016 Elsevier Patient Education  2020 [employer].

## 2020-10-08 NOTE — Transfer of Care (Signed)
Immediate Anesthesia Transfer of Care Note  Patient: Katrina Campbell  Procedure(s) Performed: VIDEO BRONCHOSCOPY WITH ENDOBRONCHIAL ULTRASOUND FINE NEEDLE ASPIRATION (FNA) LINEAR BRONCHIAL BRUSHINGS BRONCHIAL BIOPSIES BRONCHIAL WASHINGS  Patient Location: PACU  Anesthesia Type:General  Level of Consciousness: drowsy and patient cooperative  Airway & Oxygen Therapy: Patient Spontanous Breathing and Patient connected to face mask oxygen  Post-op Assessment: Report given to RN and Post -op Vital signs reviewed and stable  Post vital signs: Reviewed and stable  Last Vitals:  Vitals Value Taken Time  BP 146/82 10/08/20 1034  Temp    Pulse 63 10/08/20 1035  Resp 17 10/08/20 1035  SpO2 100 % 10/08/20 1035  Vitals shown include unvalidated device data.  Last Pain:  Vitals:   10/08/20 0642  TempSrc:   PainSc: 0-No pain      Patients Stated Pain Goal: 2 (62/82/41 7530)  Complications: No notable events documented.

## 2020-10-08 NOTE — Anesthesia Postprocedure Evaluation (Signed)
Anesthesia Post Note  Patient: [name]  Procedure(s) Performed: VIDEO BRONCHOSCOPY WITH ENDOBRONCHIAL ULTRASOUND FINE NEEDLE ASPIRATION (FNA) LINEAR BRONCHIAL BRUSHINGS BRONCHIAL BIOPSIES BRONCHIAL WASHINGS     Patient location during evaluation: PACU Anesthesia Type: General Level of consciousness: awake and alert Pain management: pain level controlled Vital Signs Assessment: post-procedure vital signs reviewed and stable Respiratory status: spontaneous breathing, nonlabored ventilation and respiratory function stable Cardiovascular status: blood pressure returned to baseline and stable Postop Assessment: no apparent nausea or vomiting Anesthetic complications: no   No notable events documented.  Last Vitals:  Vitals:   10/08/20 1050 10/08/20 1105  BP: 132/75 118/88  Pulse: 64 65  Resp: 14 17  Temp:  (!) 36.3 C  SpO2: 100% 95%    Last Pain:  Vitals:   10/08/20 1105  TempSrc:   PainSc: 0-No pain                 Mars Scheaffer,W. EDMOND

## 2020-10-09 ENCOUNTER — Other Ambulatory Visit: Payer: Self-pay | Admitting: Pulmonary Disease

## 2020-10-09 ENCOUNTER — Ambulatory Visit (INDEPENDENT_AMBULATORY_CARE_PROVIDER_SITE_OTHER): Payer: Medicare Other | Admitting: Pulmonary Disease

## 2020-10-09 ENCOUNTER — Other Ambulatory Visit: Payer: Self-pay | Admitting: Medical

## 2020-10-09 ENCOUNTER — Encounter: Payer: Self-pay | Admitting: Pulmonary Disease

## 2020-10-09 VITALS — BP 148/78 | HR 59 | Ht 64.0 in | Wt 130.0 lb

## 2020-10-09 DIAGNOSIS — R590 Localized enlarged lymph nodes: Secondary | ICD-10-CM

## 2020-10-09 DIAGNOSIS — J449 Chronic obstructive pulmonary disease, unspecified: Secondary | ICD-10-CM | POA: Diagnosis not present

## 2020-10-09 DIAGNOSIS — A310 Pulmonary mycobacterial infection: Secondary | ICD-10-CM | POA: Diagnosis not present

## 2020-10-09 DIAGNOSIS — C3491 Malignant neoplasm of unspecified part of right bronchus or lung: Secondary | ICD-10-CM

## 2020-10-09 DIAGNOSIS — J471 Bronchiectasis with (acute) exacerbation: Secondary | ICD-10-CM

## 2020-10-09 NOTE — Patient Instructions (Signed)
Thank you for visiting Dr. Valeta Harms at [facility]. Today we recommend the following:  Return in about 3 months (around 01/09/2021) for W/ Dr. Valeta Harms .    Please do your part to reduce the spread of COVID-19.

## 2020-10-09 NOTE — Progress Notes (Signed)
Synopsis: Referred in January 2021 for former smoker quit 2014 moderate COPD, MAI by Carlena Hurl, PA-C  Subjective:   PATIENT ID: Katrina Campbell GENDER: female DOB: 1955-06-01, MRN: [id number]  Chief Complaint  Patient presents with   Post-op Follow-up    Scratchy throat otherwise doing ok     [name] moderate COPD, former smoker quit 2014, MAI patient last seen in the office by Dr. Vaughan Browner.  Former Dr. Lenna Gilford patient.  Treated for stenotrophomonas in the past.  Prior 3 sputum's AFB, 1/3+ for MAI.  Saw infectious disease to discuss initiation of therapy.  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  Patient was last seen by infectious disease on 04/15/2019.  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  Just started on azithromycin 500 mg daily, rifampin 600 mg daily plus ethambutol 900 mg daily.  Patient does have CT imaging with lower lobe bronchiectasis.  Patient has chronic sputum production.  She is short of breath with daily cough and sputum production.  During this time she is very anxious and hesitant about going out in public due to her chronic cough.  She denies hemoptysis patient's weight has also been stable.  OV 11/06/2019: around the 4th of July patient had congestion and cough that still hasnt really gone away.  Overall doing much better now.  Has less cough and congestion.  Recent follow-up with infectious disease plan for Monday.  Been compliant with her medications.  Still has some daily sputum production.  Rarely has a streak of blood with cough.  OV 04/14/2020: Here today for follow-up.  Establish care with me back last year.  Was already under treatment for her MAI.  Followed up with infectious disease here in [place] as well as met with ID at [facility].  Decision was made to come off of treatments.  Recent  sputum's have been negative.  She does have significant emphysema currently compliant with inhaler regimen.  From a respiratory standpoint she is doing fine.  No significant symptom change after stopping antibiotics.   OV 10/01/2020: Here today for follow-up had abnormal lung cancer screening CT follow-up with an enlarged mediastinal node.  Patient was sent for nuclear medicine PET scan.  PET scan revealed a significantly PET avid station 7 subcarinal node with SUV of 11.  We reviewed this today in the office.  Concerning for underlying malignancy.  OV 10/09/2020: Here today for follow-up.  Recent CT scan of the chest had enlarged mediastinal node.  PET scan with PET avid station 7 and irregular lining of the right mainstem.  Patient was taken for video bronchoscopy on 10/08/2020.  Bronchoscopy revealed enlarged subcarinal adenopathy which was sampled under ultrasound guidance.  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  Endobronchial brushings, forcep biopsies of the abnormal mucosa and visible tumor were taken.     Past Medical History:  Diagnosis Date   Anxiety    COPD (chronic obstructive pulmonary disease) ([place])    Coronary artery disease    Dental staining 07/15/2019   Depression    Hyperlipidemia    Labile hypertension    Myocardial infarction [facility])    Pneumonia    Pulmonary mycobacterial infection ([place]) 10/24/2018     Family History  Problem Relation Age of Onset   Heart attack Mother  Emphysema Mother    Congestive Heart Failure Mother    Heart attack Father    Congestive Heart Failure Father    Asthma Other    Lung disease Sister    Lung disease Brother    Colon cancer Neg Hx      Past Surgical History:  Procedure Laterality Date   BRONCHIAL BIOPSY  10/08/2020   Procedure: BRONCHIAL BIOPSIES;  Surgeon: Garner Nash, DO;  Location: [place] [facility];  Service: Pulmonary;;   BRONCHIAL  BRUSHINGS  10/08/2020   Procedure: BRONCHIAL BRUSHINGS;  Surgeon: Garner Nash, DO;  Location: [place] [facility];  Service: Pulmonary;;   BRONCHIAL WASHINGS  10/08/2020   Procedure: BRONCHIAL WASHINGS;  Surgeon: Garner Nash, DO;  Location: [place];  Service: Pulmonary;;   FINE NEEDLE ASPIRATION  10/08/2020   Procedure: FINE NEEDLE ASPIRATION (FNA) LINEAR;  Surgeon: Garner Nash, DO;  Location: [place] [facility];  Service: Pulmonary;;   LEFT HEART CATH AND CORONARY ANGIOGRAPHY N/A 11/07/2017   Procedure: LEFT HEART CATH AND CORONARY ANGIOGRAPHY;  Surgeon: Nelva Bush, MD;  Location: [place] [facility];  Service: Cardiovascular;  Laterality: N/A;   NASAL SINUS SURGERY  1986   VIDEO BRONCHOSCOPY Bilateral 01/29/2019   Procedure: VIDEO BRONCHOSCOPY WITH FLUORO;  Surgeon: Marshell Garfinkel, MD;  Location: [place];  Service: Cardiopulmonary;  Laterality: Bilateral;   VIDEO BRONCHOSCOPY WITH ENDOBRONCHIAL ULTRASOUND N/A 10/08/2020   Procedure: VIDEO BRONCHOSCOPY WITH ENDOBRONCHIAL ULTRASOUND;  Surgeon: Garner Nash, DO;  Location: [place];  Service: Pulmonary;  Laterality: N/A;    Social History   Socioeconomic History   Marital status: Married    Spouse name: Not on file   Number of children: Not on file   Years of education: Not on file   Highest education level: Not on file  Occupational History   Not on file  Tobacco Use   Smoking status: Former    Packs/day: 1.75    Years: 44.00    Pack years: 77.00    Types: Cigarettes    Quit date: 11/09/2012    Years since quitting: 7.9   Smokeless tobacco: Never   Tobacco comments:    vapor  Vaping Use   Vaping Use: Former  Substance and Sexual Activity   Alcohol use: Not Currently    Alcohol/week: 24.0 standard drinks    Types: 24 Cans of beer per week   Drug use: Not Currently   Sexual activity: Not Currently  Other Topics Concern   Not on file  Social History Narrative   Not on file   Social Determinants of Health    Financial Resource Strain: Not on file  Food Insecurity: Not on file  Transportation Needs: Not on file  Physical Activity: Not on file  Stress: Not on file  Social Connections: Not on file  Intimate Partner Violence: Not on file     No Known Allergies    Outpatient Medications Prior to Visit  Medication Sig Dispense Refill   albuterol (PROAIR HFA) 108 (90 Base) MCG/ACT inhaler Inhale 2 puffs into the lungs every 6 (six) hours as needed for wheezing or shortness of breath. 24 g 0   albuterol (PROVENTIL) (2.5 MG/3ML) 0.083% nebulizer solution Take 3 mLs (2.5 mg total) by nebulization every 6 (six) hours as needed for wheezing or shortness of breath. 540 mL 3   aspirin EC 81 MG tablet Take 81 mg by mouth daily with lunch.      buPROPion (WELLBUTRIN XL) 300 MG 24 hr tablet  Take 1 tablet (300 mg total) by mouth daily. 90 tablet 3   Cholecalciferol (DIALYVITE VITAMIN D 5000) 125 MCG (5000 UT) capsule Take 5,000 Units by mouth daily.     Coenzyme Q10 (COQ-10) 100 MG CAPS Take 100 mg by mouth daily.     fluticasone (FLONASE) 50 MCG/ACT nasal spray Place 1-2 sprays into both nostrils as needed for allergies or rhinitis.     Guaifenesin (MUCINEX MAXIMUM STRENGTH) 1200 MG TB12 Take 1,200 mg by mouth daily.     Magnesium 500 MG TABS Take 500 mg by mouth daily with lunch.     Multiple Vitamins-Minerals (IMMUNE SUPPORT PO) Take 2 tablets by mouth daily.     Respiratory Therapy Supplies (FLUTTER) DEVI Use as directed 1 each 0   rosuvastatin (CRESTOR) 10 MG tablet Take 1 tablet (10 mg total) by mouth daily. 90 tablet 3   temazepam (RESTORIL) 15 MG capsule TAKE 1 CAPSULE AT BEDTIME AS NEEDED FOR SLEEP (NEED OFFICE VISIT) 90 capsule 0   umeclidinium-vilanterol (ANORO ELLIPTA) 62.5-25 MCG/INH AEPB Inhale 1 puff into the lungs daily. 180 each 3   vitamin B-12 (CYANOCOBALAMIN) 1000 MCG tablet Take 1,000 mcg by mouth daily.     Vitamin D, Ergocalciferol, (DRISDOL) 1.25 MG (50000 UNIT) CAPS capsule Take  50,000 Units by mouth every 7 (seven) days.     No facility-administered medications prior to visit.    Review of Systems  Constitutional:  Negative for chills, fever, malaise/fatigue and weight loss.  HENT:  Negative for hearing loss, sore throat and tinnitus.   Eyes:  Negative for blurred vision and double vision.  Respiratory:  Positive for cough and shortness of breath. Negative for hemoptysis, sputum production, wheezing and stridor.   Cardiovascular:  Negative for chest pain, palpitations, orthopnea, leg swelling and PND.  Gastrointestinal:  Negative for abdominal pain, constipation, diarrhea, heartburn, nausea and vomiting.  Genitourinary:  Negative for dysuria, hematuria and urgency.  Musculoskeletal:  Negative for joint pain and myalgias.  Skin:  Negative for itching and rash.  Neurological:  Negative for dizziness, tingling, weakness and headaches.  Endo/Heme/Allergies:  Negative for environmental allergies. Does not bruise/bleed easily.  Psychiatric/Behavioral:  Negative for depression. The patient is nervous/anxious. The patient does not have insomnia.   All other systems reviewed and are negative.   Objective:  Physical Exam Vitals reviewed.  Constitutional:      General: She is not in acute distress.    Appearance: She is well-developed.  HENT:     Head: Normocephalic and atraumatic.  Eyes:     General: No scleral icterus.    Conjunctiva/sclera: Conjunctivae normal.     Pupils: Pupils are equal, round, and reactive to light.  Neck:     Vascular: No JVD.     Trachea: No tracheal deviation.  Cardiovascular:     Rate and Rhythm: Normal rate and regular rhythm.     Heart sounds: Normal heart sounds. No murmur heard. Pulmonary:     Effort: Pulmonary effort is normal. No tachypnea, accessory muscle usage or respiratory distress.     Breath sounds: Normal breath sounds. No stridor. No wheezing, rhonchi or rales.  Abdominal:     General: Bowel sounds are normal. There  is no distension.     Palpations: Abdomen is soft.     Tenderness: There is no abdominal tenderness.  Musculoskeletal:        General: No tenderness.     Cervical back: Neck supple.  Lymphadenopathy:     Cervical:  No cervical adenopathy.  Skin:    General: Skin is warm and dry.     Capillary Refill: Capillary refill takes less than 2 seconds.     Findings: No rash.  Neurological:     Mental Status: She is alert and oriented to person, place, and time.  Psychiatric:        Behavior: Behavior normal.     Vitals:   10/09/20 1333  BP: (!) 148/78  Pulse: (!) 59  SpO2: 98%  Weight: 130 lb (59 kg)  Height: _0  (1.626 m)    98% on RA BMI Readings from Last 3 Encounters:  10/09/20 22.31 kg/m  10/08/20 21.80 kg/m  10/01/20 21.80 kg/m   Wt Readings from Last 3 Encounters:  10/09/20 130 lb (59 kg)  10/08/20 127 lb (57.6 kg)  10/01/20 127 lb (57.6 kg)     CBC    Component Value Date/Time   WBC 6.0 09/22/2020 1325   WBC 5.4 05/29/2018 1232   RBC 4.64 09/22/2020 1325   RBC 4.78 05/29/2018 1232   HGB 13.7 09/22/2020 1325   HCT 41.2 09/22/2020 1325   PLT 143 (L) 09/22/2020 1325   MCV 89 09/22/2020 1325   MCH 29.5 09/22/2020 1325   MCH 29.1 05/29/2018 1232   MCHC 33.3 09/22/2020 1325   MCHC 31.7 05/29/2018 1232   RDW 12.9 09/22/2020 1325   LYMPHSABS 2.1 09/22/2020 1325   MONOABS 0.5 11/06/2017 2214   EOSABS 0.1 09/22/2020 1325   BASOSABS 0.0 09/22/2020 1325    Chest Imaging:  May 2020 CT chest: Scattered nodules.  Lower lobe small areas of bronchiectasis. The patient's images have been independently reviewed by me.   PET scan 09/29/2020: SUV of 11, subcarinal adenopathy concerning for malignancy. The patient's images have been independently reviewed by me.    Pulmonary Functions Testing Results: No flowsheet data found.  FeNO: none   Pathology: none   Echocardiogram: none  Heart Catheterization: none     Assessment & Plan:     ICD-10-CM   1.  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 Lymphadenopathy, mediastinal  R59.0     3. Pulmonary Mycobacterium avium complex (MAC) infection ([place])  A31.0     4. Stage 2 moderate COPD by GOLD classification ([place])  J44.9     5. Bronchiectasis with acute exacerbation (HCC)  J47.1        Discussion:  65 year old female, moderate COPD FEV1 66%, longstanding history of smoking was enrolled in our lung cancer screening program.  Additionally has nodules and was managed for pulmonary MAC.  Breathing however has been stable.  Recent CT scan showed enlarged subcarinal adenopathy.  PET scan revealed PET avid lesion.  Patient taken for bronchoscopy on 10/08/2020 which revealed right-sided endobronchial cancer as well as associated adenopathy.  Patient had tissue sampling obtained.  Pathology has not returned yet.  Plan: Patient has been referred to medical oncology. An MRI has also been ordered to complete staging. She has lots of questions today and obviously is very anxious. We discussed time course and the neck steps over the coming weeks.  RTC in 3 months    Current Outpatient Medications:    albuterol (PROAIR HFA) 108 (90 Base) MCG/ACT inhaler, Inhale 2 puffs into the lungs every 6 (six) hours as needed for wheezing or shortness of breath., Disp: 24 g, Rfl: 0   albuterol (PROVENTIL) (2.5 MG/3ML) 0.083% nebulizer solution, Take 3 mLs (2.5 mg total) by nebulization every 6 (  six) hours as needed for wheezing or shortness of breath., Disp: 540 mL, Rfl: 3   aspirin EC 81 MG tablet, Take 81 mg by mouth daily with lunch. , Disp: , Rfl:    buPROPion (WELLBUTRIN XL) 300 MG 24 hr tablet, Take 1 tablet (300 mg total) by mouth daily., Disp: 90 tablet, Rfl: 3   Cholecalciferol (DIALYVITE VITAMIN D 5000) 125 MCG (5000 UT) capsule, Take 5,000 Units by mouth daily., Disp: , Rfl:    Coenzyme Q10 (COQ-10) 100 MG CAPS, Take 100 mg by mouth daily., Disp: , Rfl:    fluticasone (FLONASE) 50 MCG/ACT nasal  spray, Place 1-2 sprays into both nostrils as needed for allergies or rhinitis., Disp: , Rfl:    Guaifenesin (MUCINEX MAXIMUM STRENGTH) 1200 MG TB12, Take 1,200 mg by mouth daily., Disp: , Rfl:    Magnesium 500 MG TABS, Take 500 mg by mouth daily with lunch., Disp: , Rfl:    Multiple Vitamins-Minerals (IMMUNE SUPPORT PO), Take 2 tablets by mouth daily., Disp: , Rfl:    Respiratory Therapy Supplies (FLUTTER) DEVI, Use as directed, Disp: 1 each, Rfl: 0   rosuvastatin (CRESTOR) 10 MG tablet, Take 1 tablet (10 mg total) by mouth daily., Disp: 90 tablet, Rfl: 3   temazepam (RESTORIL) 15 MG capsule, TAKE 1 CAPSULE AT BEDTIME AS NEEDED FOR SLEEP (NEED OFFICE VISIT), Disp: 90 capsule, Rfl: 0   umeclidinium-vilanterol (ANORO ELLIPTA) 62.5-25 MCG/INH AEPB, Inhale 1 puff into the lungs daily., Disp: 180 each, Rfl: 3   vitamin B-12 (CYANOCOBALAMIN) 1000 MCG tablet, Take 1,000 mcg by mouth daily., Disp: , Rfl:    Vitamin D, Ergocalciferol, (DRISDOL) 1.25 MG (50000 UNIT) CAPS capsule, Take 50,000 Units by mouth every 7 (seven) days., Disp: , Rfl:     Garner Nash, DO [facility] 10/09/2020 1:43 PM

## 2020-10-12 ENCOUNTER — Encounter: Payer: Self-pay | Admitting: *Deleted

## 2020-10-12 ENCOUNTER — Other Ambulatory Visit: Payer: Self-pay | Admitting: Medical

## 2020-10-12 ENCOUNTER — Other Ambulatory Visit: Payer: Self-pay

## 2020-10-12 ENCOUNTER — Ambulatory Visit (HOSPITAL_COMMUNITY)
Admission: RE | Admit: 2020-10-12 | Discharge: 2020-10-12 | Disposition: A | Discharge status: Home / Self Care | Payer: Medicare Other | Source: Ambulatory Visit | Attending: Pulmonary Disease | Admitting: Pulmonary Disease

## 2020-10-12 DIAGNOSIS — C3491 Malignant neoplasm of unspecified part of right bronchus or lung: Secondary | ICD-10-CM | POA: Diagnosis not present

## 2020-10-12 DIAGNOSIS — R599 Enlarged lymph nodes, unspecified: Secondary | ICD-10-CM

## 2020-10-12 LAB — CYTOLOGY - NON PAP

## 2020-10-12 MED ORDER — GADOBUTROL 1 MMOL/ML IV SOLN
6.0000 mL | Freq: Once | INTRAVENOUS | Status: AC | PRN
  Administered 2020-10-12: 6 mL via INTRAVENOUS

## 2020-10-12 NOTE — Progress Notes (Signed)
I received referral form Dr. Valeta Harms. I updated new patient coordinator to call and schedule to to be seen next week with labs.

## 2020-10-13 NOTE — Progress Notes (Signed)
  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 She has an appt with Dr Julien Nordmann next week.   Thanks,  BLI  Garner Nash, DO [facility] 10/13/2020 5:20 PM

## 2020-10-15 ENCOUNTER — Other Ambulatory Visit: Payer: Self-pay | Admitting: *Deleted

## 2020-10-15 NOTE — Progress Notes (Signed)
The proposed treatment discussed in cancer conference is for discussion purpose only and is not a binding recommendation. The patient was not physically examined nor present for their treatment options. Therefore, final treatment plans cannot be decided.  ?

## 2020-10-16 ENCOUNTER — Encounter: Payer: Self-pay | Admitting: *Deleted

## 2020-10-16 NOTE — Progress Notes (Signed)
I checked on patient work up before her appt with Dr. Julien Nordmann next week. Scans and pathology are obtainable through the EMR.

## 2020-10-19 ENCOUNTER — Encounter: Payer: Self-pay | Admitting: Internal Medicine

## 2020-10-19 ENCOUNTER — Other Ambulatory Visit: Payer: Self-pay

## 2020-10-19 ENCOUNTER — Inpatient Hospital Stay: Payer: Medicare Other | Attending: Internal Medicine | Admitting: Internal Medicine

## 2020-10-19 ENCOUNTER — Encounter: Payer: Self-pay | Admitting: *Deleted

## 2020-10-19 ENCOUNTER — Telehealth: Payer: Self-pay | Admitting: Radiation Oncology

## 2020-10-19 ENCOUNTER — Inpatient Hospital Stay: Payer: Medicare Other

## 2020-10-19 DIAGNOSIS — I252 Old myocardial infarction: Secondary | ICD-10-CM

## 2020-10-19 DIAGNOSIS — I10 Essential (primary) hypertension: Secondary | ICD-10-CM

## 2020-10-19 DIAGNOSIS — F419 Anxiety disorder, unspecified: Secondary | ICD-10-CM

## 2020-10-19 DIAGNOSIS — J449 Chronic obstructive pulmonary disease, unspecified: Secondary | ICD-10-CM

## 2020-10-19 DIAGNOSIS — C3491 Malignant neoplasm of unspecified part of right bronchus or lung: Secondary | ICD-10-CM

## 2020-10-19 DIAGNOSIS — R599 Enlarged lymph nodes, unspecified: Secondary | ICD-10-CM

## 2020-10-19 DIAGNOSIS — E785 Hyperlipidemia, unspecified: Secondary | ICD-10-CM

## 2020-10-19 DIAGNOSIS — Z5111 Encounter for antineoplastic chemotherapy: Secondary | ICD-10-CM

## 2020-10-19 DIAGNOSIS — I2510 Atherosclerotic heart disease of native coronary artery without angina pectoris: Secondary | ICD-10-CM

## 2020-10-19 DIAGNOSIS — F32A Depression, unspecified: Secondary | ICD-10-CM

## 2020-10-19 DIAGNOSIS — Z87891 Personal history of nicotine dependence: Secondary | ICD-10-CM

## 2020-10-19 LAB — CBC WITH DIFFERENTIAL (CANCER CENTER ONLY)
Abs Immature Granulocytes: 0.02 10*3/uL (ref 0.00–0.07)
Basophils Absolute: 0 10*3/uL (ref 0.0–0.1)
Basophils Relative: 0 %
Eosinophils Absolute: 0.1 10*3/uL (ref 0.0–0.5)
Eosinophils Relative: 1 %
HCT: 39.8 % (ref 36.0–46.0)
Hemoglobin: 13.7 g/dL (ref 12.0–15.0)
Immature Granulocytes: 0 %
Lymphocytes Relative: 38 %
Lymphs Abs: 2.7 10*3/uL (ref 0.7–4.0)
MCH: 29.9 pg (ref 26.0–34.0)
MCHC: 34.4 g/dL (ref 30.0–36.0)
MCV: 86.9 fL (ref 80.0–100.0)
Monocytes Absolute: 0.5 10*3/uL (ref 0.1–1.0)
Monocytes Relative: 7 %
Neutro Abs: 3.8 10*3/uL (ref 1.7–7.7)
Neutrophils Relative %: 54 %
Platelet Count: 150 10*3/uL (ref 150–400)
RBC: 4.58 MIL/uL (ref 3.87–5.11)
RDW: 13.3 % (ref 11.5–15.5)
WBC Count: 7.1 10*3/uL (ref 4.0–10.5)
nRBC: 0 % (ref 0.0–0.2)

## 2020-10-19 LAB — CMP (CANCER CENTER ONLY)
ALT: 31 U/L (ref 0–44)
AST: 27 U/L (ref 15–41)
Albumin: 4.1 g/dL (ref 3.5–5.0)
Alkaline Phosphatase: 79 U/L (ref 38–126)
Anion gap: 8 (ref 5–15)
BUN: 10 mg/dL (ref 8–23)
CO2: 29 mmol/L (ref 22–32)
Calcium: 9.8 mg/dL (ref 8.9–10.3)
Chloride: 105 mmol/L (ref 98–111)
Creatinine: 0.77 mg/dL (ref 0.44–1.00)
GFR, Estimated: >60 mL/min (ref >60)
Glucose, Bld: 92 mg/dL (ref 70–99)
Potassium: 4.5 mmol/L (ref 3.5–5.1)
Sodium: 142 mmol/L (ref 135–145)
Total Bilirubin: 0.3 mg/dL (ref 0.3–1.2)
Total Protein: 7.2 g/dL (ref 6.5–8.1)

## 2020-10-19 MED ORDER — PROCHLORPERAZINE MALEATE 10 MG PO TABS: 10.0000 mg | ORAL_TABLET | Freq: Four times a day (QID) | ORAL | 0 refills | Status: DC | PRN

## 2020-10-19 NOTE — Progress Notes (Signed)
[place] [phone]   [phone]  CONSULT NOTE  REFERRING PHYSICIAN: Dr. Leory Plowman Icard  REASON FOR CONSULTATION:  65 years old white female recently diagnosed with lung cancer.  HPI Katrina Campbell is a 65 y.o. female with past medical history significant for COPD, hypertension, depression, dyslipidemia, and anxiety, coronary artery disease status postmyocardial infarction as well as history of pneumonia and long history of smoking but quit in 2014.  The patient has been evaluated by the lung screening program since 2019.  Her last CT scan of the chest on June 19, 2020 showed pulmonary nodules measuring 7.5 mm or less in size.  There was also new mucoid impaction in the anterior segment and right upper lobe bronchus.  There was no pathologically enlarged mediastinal or axillary lymph nodes.  The patient had repeat CT scan of the chest on 09/21/2020 and it showed enlarged 1.9 cm subcarinal node slightly increased from 1.6 cm on June 19, 2020.  This was concerning for metastatic disease.  There was persistent branching tubular opacity in the anterior central right upper lobe associated with abrupt cut off of the segmental anterior right upper lobe bronchus.  There was no additional enlarged mediastinal lymph nodes.  A PET scan was performed on September 29, 2020 and it showed the subcarinal lymph node/mass has a maximum SUV of 11.7 suspicious for malignancy.  There is also mildly indents the right mainstem bronchus.  There was asymmetric airway thickening in the right lung with bronchial seal in the right upper lobe.  There was only low-grade activity associated with this bronchus healed with maximum SUV of 2.3 and without focal activity.  The patient was referred to Dr. Valeta Harms and on 10/08/2020 she underwent video bronchoscopy with endobronchial ultrasound procedure with biopsy with Wang needle biopsies from station 7 node, right mainstem biopsies, brushing and washing.   The final cytology ([id number]) from the brushing of the right mainstem bronchus as well as the fine-needle aspiration of level 7 lymph node showed malignant cells consistent with squamous cell carcinoma. MRI of the brain on October 12, 2020 was negative for malignancy. Dr. Valeta Harms kindly referred the patient to me today for evaluation and recommendation regarding treatment of her condition. When seen today she is very anxious about her condition.  She continues to have cough productive of clear sputum with occasional blood-tinged mucus.  She also has shortness of breath with exertion but no significant chest pain.  The patient denied having any nausea, vomiting, diarrhea or constipation.  She intentionally lost around 10 pounds in the last 7 months with exercise and diet.  She denied having any fever or chills.  She has no headache or visual changes. Family history significant for mother with COPD, heart disease and congestive heart failure.  Father had heart disease and congestive heart failure. A sister and brother had lung cancer. The patient is married and had 1 deceased daughter.  She was accompanied today by her husband Katrina Campbell.  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  She has a history for smoking up to 1.5 pack/day for around 43 years and quit 2014.  She also has a history of alcohol in the past but not recently and no history of drug abuse.  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    Past Surgical History:  Procedure Laterality Date   BRONCHIAL BIOPSY  10/08/2020   Procedure: BRONCHIAL BIOPSIES;  Surgeon: Garner Nash, DO;  Location: [facility]  ENDOSCOPY;  Service: Pulmonary;;   BRONCHIAL BRUSHINGS  10/08/2020   Procedure: BRONCHIAL BRUSHINGS;  Surgeon: Garner Nash, DO;  Location: [facility] ENDOSCOPY;  Service: Pulmonary;;   BRONCHIAL WASHINGS  10/08/2020   Procedure: BRONCHIAL WASHINGS;  Surgeon: Garner Nash, DO;  Location: [facility] ENDOSCOPY;  Service: Pulmonary;;   FINE NEEDLE ASPIRATION  10/08/2020   Procedure: FINE NEEDLE ASPIRATION (FNA) LINEAR;  Surgeon: Garner Nash, DO;  Location: [facility] ENDOSCOPY;  Service: Pulmonary;;   LEFT HEART CATH AND CORONARY ANGIOGRAPHY N/A 11/07/2017   Procedure: LEFT HEART CATH AND CORONARY ANGIOGRAPHY;  Surgeon: Nelva Bush, MD;  Location: [facility] CV LAB;  Service: Cardiovascular;  Laterality: N/A;   NASAL SINUS SURGERY  1986   VIDEO BRONCHOSCOPY Bilateral 01/29/2019   Procedure: VIDEO BRONCHOSCOPY WITH FLUORO;  Surgeon: Marshell Garfinkel, MD;  Location: [facility];  Service: Cardiopulmonary;  Laterality: Bilateral;   VIDEO BRONCHOSCOPY WITH ENDOBRONCHIAL ULTRASOUND N/A 10/08/2020   Procedure: VIDEO BRONCHOSCOPY WITH ENDOBRONCHIAL ULTRASOUND;  Surgeon: Garner Nash, DO;  Location: [facility];  Service: Pulmonary;  Laterality: N/A;    Family History  Problem Relation Age of Onset   Heart attack Mother    Emphysema Mother    Congestive Heart Failure Mother    Heart attack Father    Congestive Heart Failure Father    Asthma Other    Lung disease Sister    Lung disease Brother    Colon cancer Neg Hx     Social History Social History   Tobacco Use   Smoking status: Former    Packs/day: 1.75    Years: 44.00    Pack years: 77.00    Types: Cigarettes    Quit date: 11/09/2012    Years since quitting: 7.9   Smokeless tobacco: Never   Tobacco comments:    vapor  Vaping Use   Vaping Use: Former  Substance Use Topics   Alcohol use: Not Currently    Alcohol/week: 24.0 standard drinks    Types: 24 Cans of beer per week   Drug use: Not Currently    No Known Allergies  Current  Outpatient Medications  Medication Sig Dispense Refill   albuterol (PROAIR HFA) 108 (90 Base) MCG/ACT inhaler Inhale 2 puffs into the lungs every 6 (six) hours as needed for wheezing or shortness of breath. 24 g 0   albuterol (PROVENTIL) (2.5 MG/3ML) 0.083% nebulizer solution Take 3 mLs (2.5 mg total) by nebulization every 6 (six) hours as needed for wheezing or shortness of breath. 540 mL 3   aspirin EC 81 MG tablet Take 81 mg by mouth daily with lunch.      buPROPion (WELLBUTRIN XL) 300 MG 24 hr tablet Take 1 tablet (300 mg total) by mouth daily. 90 tablet 3   Coenzyme Q10 (COQ-10) 100 MG CAPS Take 100 mg by mouth daily.     fluticasone (FLONASE) 50 MCG/ACT nasal spray Place 1-2 sprays into both nostrils as needed for allergies or rhinitis.     Guaifenesin (MUCINEX MAXIMUM STRENGTH) 1200 MG TB12 Take 1,200 mg by mouth daily.     Magnesium 500 MG TABS Take 500 mg  by mouth daily with lunch.     Multiple Vitamins-Minerals (IMMUNE SUPPORT PO) Take 2 tablets by mouth daily.     Respiratory Therapy Supplies (FLUTTER) DEVI Use as directed 1 each 0   rosuvastatin (CRESTOR) 10 MG tablet Take 1 tablet (10 mg total) by mouth daily. 90 tablet 3   temazepam (RESTORIL) 15 MG capsule TAKE 1 CAPSULE AT BEDTIME AS NEEDED FOR SLEEP (NEED OFFICE VISIT) 90 capsule 0   umeclidinium-vilanterol (ANORO ELLIPTA) 62.5-25 MCG/INH AEPB Inhale 1 puff into the lungs daily. 180 each 3   vitamin B-12 (CYANOCOBALAMIN) 1000 MCG tablet Take 1,000 mcg by mouth daily.     No current facility-administered medications for this visit.    Review of Systems  Constitutional: positive for weight loss Eyes: negative Ears, nose, mouth, throat, and face: negative Respiratory: positive for cough and sputum Cardiovascular: negative Gastrointestinal: negative Genitourinary:negative Integument/breast: negative Hematologic/lymphatic: negative Musculoskeletal:negative Neurological: negative Behavioral/Psych: negative Endocrine:  negative Allergic/Immunologic: negative  Physical Exam  CBJ:SEGBT, healthy, no distress, well nourished, well developed, and anxious SKIN: skin color, texture, turgor are normal, no rashes or significant lesions HEAD: Normocephalic, No masses, lesions, tenderness or abnormalities EYES: normal, PERRLA, Conjunctiva are pink and non-injected EARS: External ears normal, Canals clear OROPHARYNX:no exudate, no erythema, and lips, buccal mucosa, and tongue normal  NECK: supple, no adenopathy, no JVD LYMPH:  no palpable lymphadenopathy, no hepatosplenomegaly BREAST:not examined LUNGS: clear to auscultation , and palpation HEART: regular rate & rhythm, no murmurs, and no gallops ABDOMEN:abdomen soft, non-tender, normal bowel sounds, and no masses or organomegaly BACK: Back symmetric, no curvature., No CVA tenderness EXTREMITIES:no joint deformities, effusion, or inflammation, no edema  NEURO: alert & oriented x 3 with fluent speech, no focal motor/sensory deficits  PERFORMANCE STATUS: ECOG 1  LABORATORY DATA: Lab Results  Component Value Date   WBC 7.1 10/19/2020   HGB 13.7 10/19/2020   HCT 39.8 10/19/2020   MCV 86.9 10/19/2020   PLT 150 10/19/2020      Chemistry      Component Value Date/Time   NA 141 10/08/2020 0655   NA 144 02/10/2020 0813   K 3.9 10/08/2020 0655   CL 111 10/08/2020 0655   CO2 24 10/08/2020 0655   BUN 8 10/08/2020 0655   BUN 10 02/10/2020 0813   CREATININE 0.66 10/08/2020 0655   CREATININE 0.84 11/11/2019 1411      Component Value Date/Time   CALCIUM 8.7 (L) 10/08/2020 0655   ALKPHOS 82 02/10/2020 0813   AST 25 02/10/2020 0813   ALT 27 02/10/2020 0813   BILITOT 0.3 02/10/2020 0813       RADIOGRAPHIC STUDIES: CT Chest Wo Contrast  Result Date: 09/21/2020 CLINICAL DATA:  Follow-up mucoid impaction in the anterior right upper lobe on recent screening chest CT. Former smoker. Persisting cough. COPD. EXAM: CT CHEST WITHOUT CONTRAST TECHNIQUE:  Multidetector CT imaging of the chest was performed following the standard protocol without IV contrast. COMPARISON:  06/19/2020 screening chest CT. FINDINGS: Cardiovascular: Normal heart size. No significant pericardial effusion/thickening. Left main, left anterior descending and left circumflex coronary atherosclerosis. Atherosclerotic thoracic aorta with stable mildly dilated 4.1 cm ascending thoracic aorta. Normal caliber pulmonary arteries. Mediastinum/Nodes: No discrete thyroid nodules. Unremarkable esophagus. No axillary adenopathy. Enlarged 1.9 cm subcarinal node (series 2/image 66), slightly increased from 1.6 cm on 06/19/2020 CT, and new since 12/11/2019 CT. No additional pathologically enlarged mediastinal nodes. No discrete hilar adenopathy on these noncontrast images. Lungs/Pleura: No pneumothorax. No pleural effusion. Severe centrilobular emphysema with  diffuse bronchial wall thickening. There is a persistent branching tubular opacity in the anterior central right upper lobe associated with abrupt cut off of a segmental anterior right upper lobe bronchus (series 8/image 59), not substantially changed. Otherwise no acute consolidative airspace disease or significant pulmonary nodules. Upper abdomen: No acute abnormality. Musculoskeletal: No aggressive appearing focal osseous lesions. Mild thoracic spondylosis. IMPRESSION: 1. Subcarinal lymphadenopathy, increased since 06/19/2020 CT and new since 12/11/2019 CT, concerning for metastatic disease. Persistent branching tubular opacity in the anterior central right upper lobe associated with abrupt cut off of a segmental anterior right upper lobe bronchus, not substantially changed since 06/19/2020 CT. Underlying endobronchial malignancy not excluded. Multidisciplinary thoracic oncology consultation suggested. PET-CT recommended for further characterization. 2. Severe centrilobular emphysema with diffuse bronchial wall thickening, compatible with the  provided history of COPD. 3. Left main and two-vessel coronary atherosclerosis. 4. Aortic Atherosclerosis (ICD10-I70.0) and Emphysema (ICD10-J43.9). These results will be called to the ordering clinician or representative by the Radiologist Assistant, and communication documented in the PACS or [employer]. Electronically Signed   By: Ilona Sorrel M.D.   On: 09/21/2020 15:49   MR BRAIN W WO CONTRAST  Result Date: 10/12/2020 CLINICAL DATA:  Endobronchial carcinoma.  Staging. EXAM: MRI HEAD WITHOUT AND WITH CONTRAST TECHNIQUE: Multiplanar, multiecho pulse sequences of the brain and surrounding structures were obtained without and with intravenous contrast. CONTRAST:  49mL GADAVIST GADOBUTROL 1 MMOL/ML IV SOLN COMPARISON:  None. FINDINGS: Brain: Diffusion imaging does not show any acute or subacute infarction. No abnormality affects the brainstem or cerebellum. Cerebral hemispheres show mild chronic small-vessel change of the white matter. No cortical or large vessel territory infarction. No mass lesion, hemorrhage, hydrocephalus or extra-axial collection. After contrast administration, no abnormal enhancement occurs. Vascular: Major vessels at the base of the brain show flow. Skull and upper cervical spine: Negative Sinuses/Orbits: Clear presently. Previous functional endoscopic sinus surgery. Orbits negative. Other: None IMPRESSION: No evidence of intracranial metastatic disease. Mild chronic small-vessel change of the hemispheric white matter. Previous functional endoscopic sinus surgery. Electronically Signed   By: Nelson Chimes M.D.   On: 10/12/2020 18:12   NM PET Image Restage (PS) Skull Base to Thigh  Result Date: 09/29/2020 CLINICAL DATA:  Initial treatment strategy for mediastinal adenopathy with airway plugging/nodule in the right upper lobe. EXAM: NUCLEAR MEDICINE PET SKULL BASE TO THIGH TECHNIQUE: 6.4 mCi F-18 FDG was injected intravenously. Full-ring PET imaging was performed from the skull base  to thigh after the radiotracer. CT data was obtained and used for attenuation correction and anatomic localization. Fasting blood glucose: 105 mg/dl COMPARISON:  CT chest 09/21/2020 FINDINGS: Mediastinal blood pool activity: SUV max 1.9 Liver activity: SUV max 3.5 NECK: Bilateral palatine tonsillar activity, maximum SUV 7.6 on the left hand 5.9 on the right, likely physiologic. Glottic activity is likewise likely physiologic. Incidental CT findings: Postoperative findings in the sinuses including bilateral ethmoidectomies and maxillary antrostomies. Bilateral common carotid atherosclerotic calcification. CHEST: 1.7 cm in short axis subcarinal lymph node has a maximum SUV of 11.7, suspicious for malignancy. This indents the right mainstem bronchus. I do not observe well-defined hypermetabolic activity in the vicinity of the right upper lobe bronchocele, maximum SUV in this area is about 2.3. Incidental CT findings: Centrilobular emphysema. Airway thickening, right greater than left. Coronary, aortic arch, and branch vessel atherosclerotic vascular disease. Ascending aortic aneurysm 4.3 cm in diameter on image 71 series 4. ABDOMEN/PELVIS: Bowel activity is likely primarily physiologic. There is some localized activity in the transverse  colon with maximum SUV of 4.9, probably incidental. Incidental CT findings: Aortoiliac atherosclerotic vascular disease. SKELETON: No significant abnormal hypermetabolic activity in this region. Incidental CT findings: none IMPRESSION: 1. The subcarinal lymph node/mass has a maximum SUV of 11.7, suspicious for malignancy. This also mildly indents the right mainstem bronchus. 2. Asymmetric airway thickening in the right lung, with a bronchocele in the right upper lobe as shown on prior exams. There is only low-grade activity associated with this bronchus heel, maximum SUV 2.3, and without focal activity appreciable along the proximal margin of the bronchocele. 3. Ascending thoracic  aortic aneurysm 4.3 cm in diameter. Recommend annual imaging followup by CTA or MRA. This recommendation follows 2010 ACCF/AHA/AATS/ACR/ASA/SCA/SCAI/SIR/STS/SVM Guidelines for the Diagnosis and Management of Patients with Thoracic Aortic Disease. Circulation. 2010; 121: W546-E703. Aortic aneurysm NOS (ICD10-I71.9) 4. Aortic Atherosclerosis (ICD10-I70.0) and Emphysema (ICD10-J43.9). Coronary atherosclerosis. Electronically Signed   By: Van Clines M.D.   On: 09/29/2020 17:05   DG Chest Port 1 View  Result Date: 10/08/2020 CLINICAL DATA:  Congestive heart failure. EXAM: PORTABLE CHEST 1 VIEW COMPARISON:  PET-CT 09/29/2020.  Chest x-ray 01/29/2019. FINDINGS: Mediastinum and hilar structures normal. Heart size normal. Normal pulmonary vascularity. No focal infiltrate. No pleural effusion or pneumothorax. Reference is made to recent PET-CT report of 09/29/2020. IMPRESSION: 1.  No acute cardiopulmonary disease.  No evidence of CHF. 2.  Reference is made to recent PET-CT report of 09/29/2020. Electronically Signed   By: Marcello Moores  Register   On: 10/08/2020 15:52    ASSESSMENT: This is a very pleasant 65 years old white female recently diagnosed with stage IIIa ([place], N2, M0) non-small cell lung cancer, squamous cell carcinoma presented with subcarinal mass/lymphadenopathy diagnosed in July 2022.   PLAN: I had a lengthy discussion with the patient and her husband today about her current disease stage, prognosis and treatment options. I personally and independently reviewed the scan images and discussed the result and showed the images to the patient and her husband.  The patient is not a good candidate for surgical resection because of the location of her tumor. I recommended for the patient a course of concurrent chemoradiation with weekly carboplatin for AUC of 2 and paclitaxel 45 Mg/M2 for 6-7 weeks followed by consolidation treatment with immunotherapy with Imfinzi if the patient has no evidence for disease  progression after the induction phase of her treatment. I discussed with the patient the adverse effect of this treatment including but not limited to alopecia, myelosuppression, nausea and vomiting, peripheral neuropathy, liver or renal dysfunction. I will refer the patient to radiation oncology for evaluation and discussion of the radiotherapy option. I will arrange for the patient to have a chemotherapy education class before the first dose of her treatment. I will call her pharmacy with prescription for Compazine 10 mg p.o. every 6 hours as needed for nausea. The patient is expected to start the first cycle of this treatment on October 26, 2020 or maximum November 02, 2020 if there is a delay in seeing radiation oncology. She was advised to call immediately if she has any concerning symptoms in the interval.  The patient voices understanding of current disease status and treatment options and is in agreement with the current care plan.  All questions were answered. The patient knows to call the clinic with any problems, questions or concerns. We can certainly see the patient much sooner if necessary.  Thank you so much for allowing me to participate in the care of [facility]. I  will continue to follow up the patient with you and assist in her care.  The total time spent in the appointment was 90 minutes.  Disclaimer: This note was dictated with voice recognition software. Similar sounding words can inadvertently be transcribed and may not be corrected upon review.   Eilleen Kempf October 19, 2020, 1:57 PM

## 2020-10-19 NOTE — Progress Notes (Signed)
START ON PATHWAY REGIMEN - Non-Small Cell Lung     Administer weekly:     Paclitaxel      Carboplatin   **Always confirm dose/schedule in your pharmacy ordering system**  Patient Characteristics: Preoperative or Nonsurgical Candidate (Clinical Staging), Stage III - Nonsurgical Candidate (Nonsquamous and Squamous), PS = 0, 1 Therapeutic Status: Preoperative or Nonsurgical Candidate (Clinical Staging) AJCC T Category: cTX AJCC N Category: cNX AJCC M Category: cM0 AJCC 8 Stage Grouping: IIIA ECOG Performance Status: 1 Intent of Therapy: Curative Intent, Discussed with Patient

## 2020-10-19 NOTE — Progress Notes (Signed)
Oncology Nurse Navigator Documentation  Oncology Nurse Navigator Flowsheets 10/19/2020  Abnormal Finding Date 09/21/2020  Confirmed Diagnosis Date 10/08/2020  Diagnosis Status Confirmed Diagnosis Complete  Planned Course of Treatment Chemo/Radiation Concurrent  Phase of Treatment Radiation  Navigator Follow Up Date: 10/21/2020  Navigator Follow Up Reason: Appointment Review  Navigator Location [facility]  Referral Date to RadOnc/MedOnc 10/12/2020  Navigator Encounter Type Clinic/MDC  Patient Visit Type Initial;MedOnc  Treatment Phase Pre-Tx/Tx Discussion  Barriers/Navigation Needs Education  Education Newly Diagnosed Cancer Education;Other  Interventions Education;Psycho-Social Support  Acuity Level 2-Minimal Needs (1-2 Barriers Identified)  Education Method Verbal;Written  Time Spent with Patient 2

## 2020-10-21 ENCOUNTER — Telehealth: Payer: Self-pay | Admitting: *Deleted

## 2020-10-21 NOTE — Telephone Encounter (Signed)
I called Ms. Katrina Campbell to check and see if she had any questions regarding her appt with Dr. Julien Nordmann this week. She states not at this time. I did follow up on her schedule and she is seeing Dr. Sondra Come on 7/27.  I reached out to scheduling team to get Ms. Katrina Campbell set up for systemic therapy on 8/1 in hopes rad onc can start the same day.  I did update Ms. Katrina Campbell on the above information.  She verbalized understanding.

## 2020-10-22 ENCOUNTER — Telehealth: Payer: Self-pay | Admitting: Internal Medicine

## 2020-10-22 ENCOUNTER — Encounter: Payer: Self-pay | Admitting: *Deleted

## 2020-10-22 NOTE — Telephone Encounter (Signed)
Scheduled per los. Called and spoke with patient. Confirmed appt 

## 2020-10-23 NOTE — Progress Notes (Addendum)
Location of tumor and Histology per Pathology Report: Stage III squamous cell carcinoma of right lung  Biopsy: 10/08/2020 Surgeon: Garner Nash, DO  Operation: Flexible video fiberoptic bronchoscopy with endobronchial ultrasound and biopsies.  Past/Anticipated interventions by surgeon, if any: The patient is not a good candidate for surgical resection because of the location of her tumor. 10/08/2020 Surgeon: Garner Nash, DO  Operation: Flexible video fiberoptic bronchoscopy with endobronchial ultrasound and biopsies.   Past/Anticipated interventions by medical oncology, if any: Dr Julien Nordmann I recommended for the patient a course of concurrent chemoradiation with weekly carboplatin for AUC of 2 and paclitaxel 45 Mg/M2 for 6-7 weeks followed by consolidation treatment with immunotherapy with Imfinzi if the patient has no evidence for disease progression after the induction phase of her treatment.   Pain issues, if any:  no   SAFETY ISSUES: Prior radiation? no Pacemaker/ICD? no Possible current pregnancy? No, postmenopausal Is the patient on methotrexate? no  Current Complaints / other details:  productive cough with clear phlegm, occasional tickle in her throat     Vitals:   10/28/20 0752  BP: (!) 154/79  Pulse: 66  Resp: 18  Temp: (!) 97 F (36.1 C)  SpO2: 98%  Weight: 126 lb 12.8 oz (57.5 kg)  Height: 5\' 4"  (1.626 m)

## 2020-10-25 ENCOUNTER — Other Ambulatory Visit: Payer: Self-pay | Admitting: Medical

## 2020-10-26 NOTE — Progress Notes (Signed)
Pharmacist Chemotherapy Monitoring - Initial Assessment    Anticipated start date: 11/02/20   The following has been reviewed per standard work regarding the patient's treatment regimen: The patient's diagnosis, treatment plan and drug doses, and organ/hematologic function Lab orders and baseline tests specific to treatment regimen  The treatment plan start date, drug sequencing, and pre-medications Prior authorization status  Patient's documented medication list, including drug-drug interaction screen and prescriptions for anti-emetics and supportive care specific to the treatment regimen The drug concentrations, fluid compatibility, administration routes, and timing of the medications to be used The patient's access for treatment and lifetime cumulative dose history, if applicable  The patient's medication allergies and previous infusion related reactions, if applicable   Changes made to treatment plan:  N/A  Follow up needed:  Pending authorization for treatment    Larene Beach, [place], 10/26/2020  2:46 PM

## 2020-10-27 LAB — HM MAMMOGRAPHY

## 2020-10-27 NOTE — Progress Notes (Signed)
Radiation Oncology         [phone] ________________________________  Initial Outpatient Consultation  Name: Katrina Campbell MRN: [id number]  Date: 10/28/2020  DOB: 1955-12-29  CC:Tysinger, Camelia Eng, PA-C  Curt Bears, MD   REFERRING PHYSICIAN: Curt Bears, MD  DIAGNOSIS: The encounter diagnosis was Stage III squamous cell carcinoma of right lung ([place]). stage IIIa ([place], N2, M0) non-small cell lung cancer of the right lung, squamous cell carcinoma presented with subcarinal mass/lymphadenopathy diagnosed in July 2022.  HISTORY OF PRESENT ILLNESS::Katrina Campbell is a 65 y.o. female who is accompanied by no one. she is seen as a courtesy of Dr. Julien Nordmann for an opinion concerning radiation therapy as part of management for her diagnosed lung cancer (the patient has been evaluated by the lung screening program since 2019). The patient presented for a chest CT on 06/19/20 revealing pulmonary nodules measuring 7.5 mm or less in size, as well as new mucoid impaction in the anterior segment and right upper lobe bronchus.    The patient had repeat chest CT on 09/21/2020 which showed the enlarged subcarinal node measuring 1.9 cm;  having had slightly increased from 1.6 cm on 06/19/20. This was notably concerning for metastatic disease. In addition to these findings, there was a persistent branching tubular opacity in the anterior central right upper lobe associated with abrupt cut off of the segmental anterior right upper lobe bronchus.  PET scan performed on 09/29/20 demonstrated the previously demonstrated subcarinal node/mass to have a max SUV of 11.7 and mildly indents the mainstem bronchus; notably suspicious for malignancy. Also seen was asymmetric airway thickening in the right lung, with a bronchocele in the right upper lobe as seen on prior exams.  Accordingly, the patient was referred to Dr. Valeta Harms. The patient underwent video bronchoscopy with endobronchial ultrasound procedure and multiple  biopsies under Dr. Valeta Harms on 10/08/20. Cytology findings were as follows:  -- Right mainstem brushing and biopsy revealed: malignant cells consistent with squamous cell carcinoma --Fine needle aspiration of lymph node 7 revealed: rare malignant cells present consistent with squamous cell carcinoma Bronchoscopy further revealed enlarged subcarinal adenopathy and a tumor present within the right mainstem tracking down just superior to the opening of the right middle lobe.  MRI of the brain performed on 10/12/20 demonstrated no evidence of intracranial metastatic disease.  The patient most recently followed up with Dr. Julien Nordmann on 10/19/20. Per Dr. Worthy Flank visit not; the patient is not a good candidate for surgical resectioning due to location of her tumor. Dr. Julien Nordmann recommended the patient for  a course of concurrent chemoradiation with weekly carboplatin for AUC of 2, and paclitaxel 45 Mg/M2 for 6-7 weeks. This is to be followed by consolidation treatment with immunotherapy with Imfinzi (if the patient has no evidence for disease progression after the induction phase of her treatment).  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    PAST SURGICAL HISTORY: Past Surgical History:  Procedure Laterality Date   BRONCHIAL BIOPSY  10/08/2020   Procedure: BRONCHIAL BIOPSIES;  Surgeon: Garner Nash, DO;  Location: [place] [facility];  Service: Pulmonary;;   BRONCHIAL BRUSHINGS  10/08/2020   Procedure:  BRONCHIAL BRUSHINGS;  Surgeon: Garner Nash, DO;  Location: [place] [facility];  Service: Pulmonary;;   BRONCHIAL WASHINGS  10/08/2020   Procedure: BRONCHIAL WASHINGS;  Surgeon: Garner Nash, DO;  Location: [place] [facility];  Service: Pulmonary;;   FINE NEEDLE ASPIRATION  10/08/2020   Procedure: FINE NEEDLE ASPIRATION (FNA) LINEAR;  Surgeon: Garner Nash, DO;  Location: [place] [facility];  Service: Pulmonary;;   LEFT HEART CATH AND CORONARY ANGIOGRAPHY N/A 11/07/2017   Procedure: LEFT HEART CATH AND CORONARY ANGIOGRAPHY;  Surgeon: Nelva Bush, MD;  Location: [place] [facility];  Service: Cardiovascular;  Laterality: N/A;   NASAL SINUS SURGERY  1986   VIDEO BRONCHOSCOPY Bilateral 01/29/2019   Procedure: VIDEO BRONCHOSCOPY WITH FLUORO;  Surgeon: Marshell Garfinkel, MD;  Location: [place];  Service: Cardiopulmonary;  Laterality: Bilateral;   VIDEO BRONCHOSCOPY WITH ENDOBRONCHIAL ULTRASOUND N/A 10/08/2020   Procedure: VIDEO BRONCHOSCOPY WITH ENDOBRONCHIAL ULTRASOUND;  Surgeon: Garner Nash, DO;  Location: [place];  Service: Pulmonary;  Laterality: N/A;    FAMILY HISTORY:  Family History  Problem Relation Age of Onset   Heart attack Mother    Emphysema Mother    Congestive Heart Failure Mother    Heart attack Father    Congestive Heart Failure Father    Asthma Other    Lung disease Sister    Lung disease Brother    Colon cancer Neg Hx     SOCIAL HISTORY:  Social History   Tobacco Use   Smoking status: Former    Packs/day: 1.75    Years: 44.00    Pack years: 77.00    Types: Cigarettes    Quit date: 11/09/2012    Years since quitting: 7.9   Smokeless tobacco: Never   Tobacco comments:    vapor  Vaping Use   Vaping Use: Former  Substance Use Topics   Alcohol use: Not Currently    Alcohol/week: 24.0 standard drinks    Types: 24 Cans of beer per week   Drug use: Not Currently    ALLERGIES: No Known Allergies  MEDICATIONS:  Current Outpatient Medications  Medication Sig Dispense Refill   albuterol (PROAIR HFA) 108 (90 Base) MCG/ACT inhaler Inhale 2 puffs into the lungs every 6 (six) hours as needed for wheezing or shortness of breath. 24 g 0    albuterol (PROVENTIL) (2.5 MG/3ML) 0.083% nebulizer solution Take 3 mLs (2.5 mg total) by nebulization every 6 (six) hours as needed for wheezing or shortness of breath. 540 mL 3   aspirin EC 81 MG tablet Take 81 mg by mouth daily with lunch.      buPROPion (WELLBUTRIN XL) 300 MG 24 hr tablet Take 1 tablet (300 mg total) by mouth daily. 90 tablet 3   Coenzyme Q10 (COQ-10) 100 MG CAPS Take 100 mg by mouth daily.     fluticasone (FLONASE) 50 MCG/ACT nasal spray Place 1-2 sprays into both nostrils as needed for allergies or rhinitis.     Guaifenesin (MUCINEX MAXIMUM STRENGTH) 1200 MG TB12 Take 1,200 mg by mouth daily.     Magnesium 500 MG TABS Take 500 mg by mouth daily with lunch.     Multiple Vitamins-Minerals (IMMUNE SUPPORT PO) Take 2 tablets by mouth daily.     prochlorperazine (COMPAZINE) 10 MG tablet Take 1 tablet (10 mg total) by mouth every 6 (six) hours as needed for nausea or vomiting. 30 tablet 0   Respiratory Therapy Supplies (FLUTTER) DEVI Use as directed 1 each 0   rosuvastatin (CRESTOR) 10  MG tablet Take 1 tablet (10 mg total) by mouth daily. 90 tablet 3   temazepam (RESTORIL) 15 MG capsule TAKE 1 CAPSULE AT BEDTIME AS NEEDED FOR SLEEP (NEED OFFICE VISIT) 90 capsule 0   umeclidinium-vilanterol (ANORO ELLIPTA) 62.5-25 MCG/INH AEPB Inhale 1 puff into the lungs daily. 180 each 3   vitamin B-12 (CYANOCOBALAMIN) 1000 MCG tablet Take 1,000 mcg by mouth daily.     No current facility-administered medications for this encounter.    REVIEW OF SYSTEMS:  A 10+ POINT REVIEW OF SYSTEMS WAS OBTAINED including neurology, dermatology, psychiatry, cardiac, respiratory, lymph, extremities, GI, GU, musculoskeletal, constitutional, reproductive, HEENT.  She has occasional mild headaches upon awakening.  She denies any visual difficulties.  She denies any swallowing difficulties or pain with swallowing.  She denies any significant cough or breathing issues.  She works out on her treadmill approximately 20  minutes/day.  She reports a long history of bronchitis and was seeing Dr. Chipper Herb prior to his retirement.  She denies any significant hemoptysis.  She has noticed some taste changes with food.   PHYSICAL EXAM:  height is 5\' 4"  (1.626 m) and weight is 126 lb 12.8 oz (57.5 kg). Her temperature is 97 F (36.1 C) (abnormal). Her blood pressure is 154/79 (abnormal) and her pulse is 66. Her respiration is 18 and oxygen saturation is 98%.   General: Alert and oriented, in no acute distress HEENT: Head is normocephalic. Extraocular movements are intact.  Neck: Neck is supple, no palpable cervical or supraclavicular lymphadenopathy. Heart: Regular in rate and rhythm with no murmurs, rubs, or gallops. Chest: Clear to auscultation bilaterally, with no rhonchi, wheezes, or rales. Abdomen: Soft, nontender, nondistended, with no rigidity or guarding. Extremities: No cyanosis or edema. Lymphatics: see Neck Exam Skin: No concerning lesions. Musculoskeletal: symmetric strength and muscle tone throughout. Neurologic: Cranial nerves II through XII are grossly intact. No obvious focalities. Speech is fluent. Coordination is intact. Psychiatric: Judgment and insight are intact. Affect is appropriate.   ECOG = 1  0 - Asymptomatic (Fully active, able to carry on all predisease activities without restriction)  1 - Symptomatic but completely ambulatory (Restricted in physically strenuous activity but ambulatory and able to carry out work of a light or sedentary nature. For example, light housework, office work)  2 - Symptomatic, <50% in bed during the day (Ambulatory and capable of all self care but unable to carry out any work activities. Up and about more than 50% of waking hours)  3 - Symptomatic, >50% in bed, but not bedbound (Capable of only limited self-care, confined to bed or chair 50% or more of waking hours)  4 - Bedbound (Completely disabled. Cannot carry on any self-care. Totally confined to bed or  chair)  5 - Death   Eustace Pen MM, Creech RH, Tormey DC, et al. [phone]). "Toxicity and response criteria of the [facility]". [place] Oncol. 5 (6): 649-55  LABORATORY DATA:  Lab Results  Component Value Date   WBC 7.1 10/19/2020   HGB 13.7 10/19/2020   HCT 39.8 10/19/2020   MCV 86.9 10/19/2020   PLT 150 10/19/2020   NEUTROABS 3.8 10/19/2020   Lab Results  Component Value Date   NA 142 10/19/2020   K 4.5 10/19/2020   CL 105 10/19/2020   CO2 29 10/19/2020   GLUCOSE 92 10/19/2020   CREATININE 0.77 10/19/2020   CALCIUM 9.8 10/19/2020      RADIOGRAPHY: MR BRAIN W WO CONTRAST  Result Date: 10/12/2020  CLINICAL DATA:  Endobronchial carcinoma.  Staging. EXAM: MRI HEAD WITHOUT AND WITH CONTRAST TECHNIQUE: Multiplanar, multiecho pulse sequences of the brain and surrounding structures were obtained without and with intravenous contrast. CONTRAST:  65mL GADAVIST GADOBUTROL 1 MMOL/ML IV SOLN COMPARISON:  None. FINDINGS: Brain: Diffusion imaging does not show any acute or subacute infarction. No abnormality affects the brainstem or cerebellum. Cerebral hemispheres show mild chronic small-vessel change of the white matter. No cortical or large vessel territory infarction. No mass lesion, hemorrhage, hydrocephalus or extra-axial collection. After contrast administration, no abnormal enhancement occurs. Vascular: Major vessels at the base of the brain show flow. Skull and upper cervical spine: Negative Sinuses/Orbits: Clear presently. Previous functional endoscopic sinus surgery. Orbits negative. Other: None IMPRESSION: No evidence of intracranial metastatic disease. Mild chronic small-vessel change of the hemispheric white matter. Previous functional endoscopic sinus surgery. Electronically Signed   By: Nelson Chimes M.D.   On: 10/12/2020 18:12   NM PET Image Restage (PS) Skull Base to Thigh  Result Date: 09/29/2020 CLINICAL DATA:  Initial treatment strategy for mediastinal  adenopathy with airway plugging/nodule in the right upper lobe. EXAM: NUCLEAR MEDICINE PET SKULL BASE TO THIGH TECHNIQUE: 6.4 mCi F-18 FDG was injected intravenously. Full-ring PET imaging was performed from the skull base to thigh after the radiotracer. CT data was obtained and used for attenuation correction and anatomic localization. Fasting blood glucose: 105 mg/dl COMPARISON:  CT chest 09/21/2020 FINDINGS: Mediastinal blood pool activity: SUV max 1.9 Liver activity: SUV max 3.5 NECK: Bilateral palatine tonsillar activity, maximum SUV 7.6 on the left hand 5.9 on the right, likely physiologic. Glottic activity is likewise likely physiologic. Incidental CT findings: Postoperative findings in the sinuses including bilateral ethmoidectomies and maxillary antrostomies. Bilateral common carotid atherosclerotic calcification. CHEST: 1.7 cm in short axis subcarinal lymph node has a maximum SUV of 11.7, suspicious for malignancy. This indents the right mainstem bronchus. I do not observe well-defined hypermetabolic activity in the vicinity of the right upper lobe bronchocele, maximum SUV in this area is about 2.3. Incidental CT findings: Centrilobular emphysema. Airway thickening, right greater than left. Coronary, aortic arch, and branch vessel atherosclerotic vascular disease. Ascending aortic aneurysm 4.3 cm in diameter on image 71 series 4. ABDOMEN/PELVIS: Bowel activity is likely primarily physiologic. There is some localized activity in the transverse colon with maximum SUV of 4.9, probably incidental. Incidental CT findings: Aortoiliac atherosclerotic vascular disease. SKELETON: No significant abnormal hypermetabolic activity in this region. Incidental CT findings: none IMPRESSION: 1. The subcarinal lymph node/mass has a maximum SUV of 11.7, suspicious for malignancy. This also mildly indents the right mainstem bronchus. 2. Asymmetric airway thickening in the right lung, with a bronchocele in the right upper lobe  as shown on prior exams. There is only low-grade activity associated with this bronchus heel, maximum SUV 2.3, and without focal activity appreciable along the proximal margin of the bronchocele. 3. Ascending thoracic aortic aneurysm 4.3 cm in diameter. Recommend annual imaging followup by CTA or MRA. This recommendation follows 2010 ACCF/AHA/AATS/ACR/ASA/SCA/SCAI/SIR/STS/SVM Guidelines for the Diagnosis and Management of Patients with Thoracic Aortic Disease. Circulation. 2010; 121: S063-K160. Aortic aneurysm NOS (ICD10-I71.9) 4. Aortic Atherosclerosis (ICD10-I70.0) and Emphysema (ICD10-J43.9). Coronary atherosclerosis. Electronically Signed   By: Van Clines M.D.   On: 09/29/2020 17:05   DG Chest Port 1 View  Result Date: 10/08/2020 CLINICAL DATA:  Congestive heart failure. EXAM: PORTABLE CHEST 1 VIEW COMPARISON:  PET-CT 09/29/2020.  Chest x-ray 01/29/2019. FINDINGS: Mediastinum and hilar structures normal. Heart size normal. Normal pulmonary  vascularity. No focal infiltrate. No pleural effusion or pneumothorax. Reference is made to recent PET-CT report of 09/29/2020. IMPRESSION: 1.  No acute cardiopulmonary disease.  No evidence of CHF. 2.  Reference is made to recent PET-CT report of 09/29/2020. Electronically Signed   By: Marcello Moores  Register   On: 10/08/2020 15:52      IMPRESSION: stage IIIa ([place], N2, M0) non-small cell lung cancer of the right lung, squamous cell carcinoma presented with subcarinal mass/lymphadenopathy diagnosed in July 2022.  Primary site appears to be along the medial wall of the right mainstem bronchus, biopsy confirmed  She would be a good candidate for definitive course of radiation therapy along with radiosensitizing chemotherapy.  Today, I talked to the patient about the findings and work-up thus far.  We discussed the natural history of stage IIIa non-small cell lung cancer and general treatment, highlighting the role of radiotherapy in the management.  We discussed the  available radiation techniques, and focused on the details of logistics and delivery.  We reviewed the anticipated acute and late sequelae associated with radiation in this setting.  The patient was encouraged to ask questions that I answered to the best of my ability.  A patient consent form was discussed and signed.  We retained a copy for our records.  The patient would like to proceed with radiation and will be scheduled for CT simulation.  PLAN: She will return for CT simulation this afternoon at 3 PM with treatments to begin August 2 concomitant with her first cycle of radiosensitizing chemotherapy.  Anticipate 30 treatments directed at the right hilar and subcarinal region.   60 minutes of total time was spent for this patient encounter, including preparation, face-to-face counseling with the patient and coordination of care, physical exam, and documentation of the encounter.   ------------------------------------------------  Blair Promise, PhD, MD  This document serves as a record of services personally performed by Gery Pray, MD. It was created on his behalf by Roney Mans, a trained medical scribe. The creation of this record is based on the scribe's personal observations and the provider's statements to them. This document has been checked and approved by the attending provider.

## 2020-10-28 ENCOUNTER — Inpatient Hospital Stay: Payer: Medicare Other

## 2020-10-28 ENCOUNTER — Encounter: Payer: Self-pay | Admitting: Radiation Oncology

## 2020-10-28 ENCOUNTER — Telehealth: Payer: Self-pay | Admitting: Medical

## 2020-10-28 ENCOUNTER — Ambulatory Visit
Admission: RE | Admit: 2020-10-28 | Discharge: 2020-10-28 | Disposition: A | Discharge status: Home / Self Care | Payer: Medicare Other | Source: Ambulatory Visit | Attending: Radiation Oncology | Admitting: Radiation Oncology

## 2020-10-28 ENCOUNTER — Other Ambulatory Visit: Payer: Self-pay

## 2020-10-28 VITALS — BP 154/79 | HR 66 | Temp 97.0°F | Resp 18 | Ht 64.0 in | Wt 126.8 lb

## 2020-10-28 DIAGNOSIS — J449 Chronic obstructive pulmonary disease, unspecified: Secondary | ICD-10-CM | POA: Insufficient documentation

## 2020-10-28 DIAGNOSIS — I712 Thoracic aortic aneurysm, without rupture: Secondary | ICD-10-CM | POA: Diagnosis not present

## 2020-10-28 DIAGNOSIS — E785 Hyperlipidemia, unspecified: Secondary | ICD-10-CM | POA: Insufficient documentation

## 2020-10-28 DIAGNOSIS — I2510 Atherosclerotic heart disease of native coronary artery without angina pectoris: Secondary | ICD-10-CM | POA: Insufficient documentation

## 2020-10-28 DIAGNOSIS — I10 Essential (primary) hypertension: Secondary | ICD-10-CM | POA: Diagnosis not present

## 2020-10-28 DIAGNOSIS — Z87891 Personal history of nicotine dependence: Secondary | ICD-10-CM | POA: Insufficient documentation

## 2020-10-28 DIAGNOSIS — C3401 Malignant neoplasm of right main bronchus: Secondary | ICD-10-CM | POA: Diagnosis not present

## 2020-10-28 DIAGNOSIS — C3491 Malignant neoplasm of unspecified part of right bronchus or lung: Secondary | ICD-10-CM

## 2020-10-28 DIAGNOSIS — J432 Centrilobular emphysema: Secondary | ICD-10-CM | POA: Insufficient documentation

## 2020-10-28 DIAGNOSIS — I252 Old myocardial infarction: Secondary | ICD-10-CM | POA: Diagnosis not present

## 2020-10-28 DIAGNOSIS — Z79899 Other long term (current) drug therapy: Secondary | ICD-10-CM | POA: Insufficient documentation

## 2020-10-28 NOTE — Progress Notes (Signed)
See MD note for nursing evaluation. °

## 2020-10-28 NOTE — Progress Notes (Signed)
[facility] OFFICE PROGRESS NOTE  Carlena Hurl, PA-C [place] [address]  DIAGNOSIS: Stage IIIa (TX, N2, M0) non-small cell lung cancer, squamous cell carcinoma presented with subcarinal mass/lymphadenopathy diagnosed in July 2022.  PRIOR THERAPY: None  CURRENT THERAPY: Weekly carboplatin for AUC of 2 and paclitaxel 45 mg per metered square.  First dose expected on 11/02/2020.  INTERVAL HISTORY: Katrina Campbell 65 y.o. female returns to the clinic today for a follow-up visit.  The patient was recently diagnosed with stage IIIa non-small cell lung cancer.  The patient is not a surgical candidate; therefore, concurrent chemoradiation was recommended.  She met with Dr. Sondra Come last week and is starting radiation tomorrow.  Her last day of radiation is scheduled for 12/15/20. She is here today to undergo her first cycle of chemotherapy.  Overall, the patient is feeling well today.  She denies any fever, chills, or night sweats. She notes over the span of the last few months she has been losing weight. She lost 1 lb since her last appointment.  Her breathing is stable. She is active on the treadmill and notes that after a workout she may have some shortness of breath or with household chores such as making the bed.  She had a few episodes of blood tinged sputum. She sometimes has coughing spells which she attributes to wearing her mask. She denies any nausea, vomiting, diarrhea, or constipation.  She denies any headache or visual changes.  The patient is here today for evaluation before starting her first cycle of treatment.    MEDICAL HISTORY: Past Medical History:  Diagnosis Date   Anxiety    COPD (chronic obstructive pulmonary disease) ([place])    Coronary artery disease    Dental staining 07/15/2019   Depression    Hyperlipidemia    Labile hypertension    Myocardial infarction ([place])    Pneumonia    Pulmonary mycobacterial infection ([place]) 10/24/2018     ALLERGIES:  has No Known Allergies.  MEDICATIONS:  Current Outpatient Medications  Medication Sig Dispense Refill   albuterol (PROAIR HFA) 108 (90 Base) MCG/ACT inhaler Inhale 2 puffs into the lungs every 6 (six) hours as needed for wheezing or shortness of breath. 24 g 0   albuterol (PROVENTIL) (2.5 MG/3ML) 0.083% nebulizer solution Take 3 mLs (2.5 mg total) by nebulization every 6 (six) hours as needed for wheezing or shortness of breath. 540 mL 3   aspirin EC 81 MG tablet Take 81 mg by mouth daily with lunch.      buPROPion (WELLBUTRIN XL) 300 MG 24 hr tablet Take 1 tablet (300 mg total) by mouth daily. 90 tablet 3   Coenzyme Q10 (COQ-10) 100 MG CAPS Take 100 mg by mouth daily.     fluticasone (FLONASE) 50 MCG/ACT nasal spray Place 1-2 sprays into both nostrils as needed for allergies or rhinitis.     Guaifenesin (MUCINEX MAXIMUM STRENGTH) 1200 MG TB12 Take 1,200 mg by mouth daily.     Magnesium 500 MG TABS Take 500 mg by mouth daily with lunch.     Multiple Vitamins-Minerals (IMMUNE SUPPORT PO) Take 2 tablets by mouth daily.     prochlorperazine (COMPAZINE) 10 MG tablet Take 1 tablet (10 mg total) by mouth every 6 (six) hours as needed for nausea or vomiting. 30 tablet 0   Respiratory Therapy Supplies (FLUTTER) DEVI Use as directed 1 each 0   rosuvastatin (CRESTOR) 10 MG tablet Take 1 tablet (10 mg total) by mouth  daily. 90 tablet 3   temazepam (RESTORIL) 15 MG capsule TAKE 1 CAPSULE AT BEDTIME AS NEEDED FOR SLEEP (NEED OFFICE VISIT) 90 capsule 0   umeclidinium-vilanterol (ANORO ELLIPTA) 62.5-25 MCG/INH AEPB Inhale 1 puff into the lungs daily. 180 each 3   vitamin B-12 (CYANOCOBALAMIN) 1000 MCG tablet Take 1,000 mcg by mouth daily.     No current facility-administered medications for this visit.    SURGICAL HISTORY:  Past Surgical History:  Procedure Laterality Date   BRONCHIAL BIOPSY  10/08/2020   Procedure: BRONCHIAL BIOPSIES;  Surgeon: Garner Nash, DO;  Location: [facility]  ENDOSCOPY;  Service: Pulmonary;;   BRONCHIAL BRUSHINGS  10/08/2020   Procedure: BRONCHIAL BRUSHINGS;  Surgeon: Garner Nash, DO;  Location: [facility];  Service: Pulmonary;;   BRONCHIAL WASHINGS  10/08/2020   Procedure: BRONCHIAL WASHINGS;  Surgeon: Garner Nash, DO;  Location: [facility] ENDOSCOPY;  Service: Pulmonary;;   FINE NEEDLE ASPIRATION  10/08/2020   Procedure: FINE NEEDLE ASPIRATION (FNA) LINEAR;  Surgeon: Garner Nash, DO;  Location: [facility] ENDOSCOPY;  Service: Pulmonary;;   LEFT HEART CATH AND CORONARY ANGIOGRAPHY N/A 11/07/2017   Procedure: LEFT HEART CATH AND CORONARY ANGIOGRAPHY;  Surgeon: Nelva Bush, MD;  Location: [facility];  Service: Cardiovascular;  Laterality: N/A;   NASAL SINUS SURGERY  1986   VIDEO BRONCHOSCOPY Bilateral 01/29/2019   Procedure: VIDEO BRONCHOSCOPY WITH FLUORO;  Surgeon: Marshell Garfinkel, MD;  Location: [facility];  Service: Cardiopulmonary;  Laterality: Bilateral;   VIDEO BRONCHOSCOPY WITH ENDOBRONCHIAL ULTRASOUND N/A 10/08/2020   Procedure: VIDEO BRONCHOSCOPY WITH ENDOBRONCHIAL ULTRASOUND;  Surgeon: Garner Nash, DO;  Location: [facility];  Service: Pulmonary;  Laterality: N/A;    REVIEW OF SYSTEMS:   Review of Systems  Constitutional: Positive for progressive weight loss over 6 months. Negative for appetite change, chills, fatigue, and fever. HENT: Negative for mouth sores, nosebleeds, sore throat and trouble swallowing.   Eyes: Negative for eye problems and icterus.  Respiratory: Positive for dyspnea on exertion and chronic cough.Negative for hemoptysis shortness of breath and wheezing.   Cardiovascular: Negative for chest pain and leg swelling.  Gastrointestinal: Negative for abdominal pain, constipation, diarrhea, nausea and vomiting.  Genitourinary: Negative for bladder incontinence, difficulty urinating, dysuria, frequency and hematuria.   Musculoskeletal: Negative for back pain, gait problem, neck pain and neck stiffness.  Skin:  Negative for itching and rash.  Neurological: Negative for dizziness, extremity weakness, gait problem, headaches, light-headedness and seizures.  Hematological: Negative for adenopathy. Does not bruise/bleed easily.  Psychiatric/Behavioral: Negative for confusion, depression and sleep disturbance. The patient is not nervous/anxious.     PHYSICAL EXAMINATION:  There were no vitals taken for this visit.  ECOG PERFORMANCE STATUS: 1  Physical Exam  Constitutional: Oriented to person, place, and time and well-developed, well-nourished, and in no distress.  HENT:  Head: Normocephalic and atraumatic.  Mouth/Throat: Oropharynx is clear and moist. No oropharyngeal exudate.  Eyes: Conjunctivae are normal. Right eye exhibits no discharge. Left eye exhibits no discharge. No scleral icterus.  Neck: Normal range of motion. Neck supple.  Cardiovascular: Normal rate, regular rhythm, normal heart sounds and intact distal pulses.   Pulmonary/Chest: Effort normal. Quiet breath sounds in all lung fields. No respiratory distress. No wheezes. No rales.  Abdominal: Soft. Bowel sounds are normal. Exhibits no distension and no mass. There is no tenderness.  Musculoskeletal: Normal range of motion. Exhibits no edema.  Lymphadenopathy:    No cervical adenopathy.  Neurological: Alert and oriented to person, place, and  time. Exhibits normal muscle tone. Gait normal. Coordination normal.  Skin: Skin is warm and dry. No rash noted. Not diaphoretic. No erythema. No pallor.  Psychiatric: Mood, memory and judgment normal.  Vitals reviewed.  LABORATORY DATA: Lab Results  Component Value Date   WBC 7.1 10/19/2020   HGB 13.7 10/19/2020   HCT 39.8 10/19/2020   MCV 86.9 10/19/2020   PLT 150 10/19/2020      Chemistry      Component Value Date/Time   NA 142 10/19/2020 1329   NA 144 02/10/2020 0813   K 4.5 10/19/2020 1329   CL 105 10/19/2020 1329   CO2 29 10/19/2020 1329   BUN 10 10/19/2020 1329   BUN 10  02/10/2020 0813   CREATININE 0.77 10/19/2020 1329   CREATININE 0.84 11/11/2019 1411      Component Value Date/Time   CALCIUM 9.8 10/19/2020 1329   ALKPHOS 79 10/19/2020 1329   AST 27 10/19/2020 1329   ALT 31 10/19/2020 1329   BILITOT 0.3 10/19/2020 1329       RADIOGRAPHIC STUDIES:  MR BRAIN W WO CONTRAST  Result Date: 10/12/2020 CLINICAL DATA:  Endobronchial carcinoma.  Staging. EXAM: MRI HEAD WITHOUT AND WITH CONTRAST TECHNIQUE: Multiplanar, multiecho pulse sequences of the brain and surrounding structures were obtained without and with intravenous contrast. CONTRAST:  55m GADAVIST GADOBUTROL 1 MMOL/ML IV SOLN COMPARISON:  None. FINDINGS: Brain: Diffusion imaging does not show any acute or subacute infarction. No abnormality affects the brainstem or cerebellum. Cerebral hemispheres show mild chronic small-vessel change of the white matter. No cortical or large vessel territory infarction. No mass lesion, hemorrhage, hydrocephalus or extra-axial collection. After contrast administration, no abnormal enhancement occurs. Vascular: Major vessels at the base of the brain show flow. Skull and upper cervical spine: Negative Sinuses/Orbits: Clear presently. Previous functional endoscopic sinus surgery. Orbits negative. Other: None IMPRESSION: No evidence of intracranial metastatic disease. Mild chronic small-vessel change of the hemispheric white matter. Previous functional endoscopic sinus surgery. Electronically Signed   By: MNelson ChimesM.D.   On: 10/12/2020 18:12   NM PET Image Restage (PS) Skull Base to Thigh  Result Date: 09/29/2020 CLINICAL DATA:  Initial treatment strategy for mediastinal adenopathy with airway plugging/nodule in the right upper lobe. EXAM: NUCLEAR MEDICINE PET SKULL BASE TO THIGH TECHNIQUE: 6.4 mCi F-18 FDG was injected intravenously. Full-ring PET imaging was performed from the skull base to thigh after the radiotracer. CT data was obtained and used for attenuation  correction and anatomic localization. Fasting blood glucose: 105 mg/dl COMPARISON:  CT chest 09/21/2020 FINDINGS: Mediastinal blood pool activity: SUV max 1.9 Liver activity: SUV max 3.5 NECK: Bilateral palatine tonsillar activity, maximum SUV 7.6 on the left hand 5.9 on the right, likely physiologic. Glottic activity is likewise likely physiologic. Incidental CT findings: Postoperative findings in the sinuses including bilateral ethmoidectomies and maxillary antrostomies. Bilateral common carotid atherosclerotic calcification. CHEST: 1.7 cm in short axis subcarinal lymph node has a maximum SUV of 11.7, suspicious for malignancy. This indents the right mainstem bronchus. I do not observe well-defined hypermetabolic activity in the vicinity of the right upper lobe bronchocele, maximum SUV in this area is about 2.3. Incidental CT findings: Centrilobular emphysema. Airway thickening, right greater than left. Coronary, aortic arch, and branch vessel atherosclerotic vascular disease. Ascending aortic aneurysm 4.3 cm in diameter on image 71 series 4. ABDOMEN/PELVIS: Bowel activity is likely primarily physiologic. There is some localized activity in the transverse colon with maximum SUV of 4.9, probably incidental. Incidental CT  findings: Aortoiliac atherosclerotic vascular disease. SKELETON: No significant abnormal hypermetabolic activity in this region. Incidental CT findings: none IMPRESSION: 1. The subcarinal lymph node/mass has a maximum SUV of 11.7, suspicious for malignancy. This also mildly indents the right mainstem bronchus. 2. Asymmetric airway thickening in the right lung, with a bronchocele in the right upper lobe as shown on prior exams. There is only low-grade activity associated with this bronchus heel, maximum SUV 2.3, and without focal activity appreciable along the proximal margin of the bronchocele. 3. Ascending thoracic aortic aneurysm 4.3 cm in diameter. Recommend annual imaging followup by CTA or  MRA. This recommendation follows 2010 ACCF/AHA/AATS/ACR/ASA/SCA/SCAI/SIR/STS/SVM Guidelines for the Diagnosis and Management of Patients with Thoracic Aortic Disease. Circulation. 2010; 121: V425-Z563. Aortic aneurysm NOS (ICD10-I71.9) 4. Aortic Atherosclerosis (ICD10-I70.0) and Emphysema (ICD10-J43.9). Coronary atherosclerosis. Electronically Signed   By: Van Clines M.D.   On: 09/29/2020 17:05   DG Chest Port 1 View  Result Date: 10/08/2020 CLINICAL DATA:  Congestive heart failure. EXAM: PORTABLE CHEST 1 VIEW COMPARISON:  PET-CT 09/29/2020.  Chest x-ray 01/29/2019. FINDINGS: Mediastinum and hilar structures normal. Heart size normal. Normal pulmonary vascularity. No focal infiltrate. No pleural effusion or pneumothorax. Reference is made to recent PET-CT report of 09/29/2020. IMPRESSION: 1.  No acute cardiopulmonary disease.  No evidence of CHF. 2.  Reference is made to recent PET-CT report of 09/29/2020. Electronically Signed   By: Marcello Moores  Register   On: 10/08/2020 15:52     ASSESSMENT/PLAN:  This is a very pleasant 65 year old Caucasian female diagnosed with stage IIIa (Tx, N2, M0) non-small cell lung cancer, squamous cell carcinoma.  She presented with a subcarinal mass/lymphadenopathy.  She was diagnosed in July 2022.   The patient is not a candidate for surgical resection due to the location of the tumor.  Therefore, the patient is currently undergoing a course of concurrent chemoradiation with weekly carboplatin for an AUC of 2 and paclitaxel 45 mg per metered squared.  She is expected to receive her first dose of treatment today.  Her last day radiation is estimated to be on 12/15/20.   Labs reviewed.  Recommend that she proceed with cycle #1 today as scheduled.  We will see her back for a follow-up visit in 2 weeks for evaluation before starting cycle #3.   The patient was advised to call immediately if she has any concerning symptoms in the interval. The patient voices  understanding of current disease status and treatment options and is in agreement with the current care plan. All questions were answered. The patient knows to call the clinic with any problems, questions or concerns. We can certainly see the patient much sooner if necessary      No orders of the defined types were placed in this encounter.    The total time spent in the appointment was 20-29 minutes.   Verneal Wiers L Cailen Texeira, PA-C 10/28/20

## 2020-10-28 NOTE — Telephone Encounter (Signed)
mammogram was normal, no worrisome findings.

## 2020-10-28 NOTE — Telephone Encounter (Signed)
Pt was advised Katrina Campbell 

## 2020-10-29 ENCOUNTER — Encounter: Payer: Self-pay | Admitting: General Practice

## 2020-10-29 ENCOUNTER — Telehealth: Payer: Self-pay | Admitting: General Practice

## 2020-10-29 NOTE — Progress Notes (Signed)
[facility] Spiritual Care Note  Referred per patient request by Webb Silversmith Cunningham/LCSW. Lilly Cove by phone to introduce Spiritual Care and to let her know that I will be pleased to meet her in person (per her request) at her second treatment on 8/8, as I will be out of the office on 8/1. She was appreciative of call and had no other needs at this time.   [place], [place], [facility] Pager [phone] Voicemail [phone]

## 2020-10-29 NOTE — Progress Notes (Signed)
[facility] Psychosocial Distress Screening Clinical Social Work  Clinical Social Work was referred by distress screening protocol.  The patient scored a 7 on the Psychosocial Distress Thermometer which indicates moderate distress. Clinical Social Worker contacted patient by phone to assess for distress and other psychosocial needs. Had cough approx a year ago, was placed on antibiotics.  Had regular CT scans for screenings - latest scan visualized tumor which appeared from March - June 2022.  "I was devastated."  Now diagnosed with Stage III NSCLC.  Discussed availability of support through [facility] [facility] [place], [employer] and similar.  Wants to wait and see "how things come to me, take it one day at a time."  Was helping w care of sister w dementia and needed rehab.  Wants to think about her options for support, welcomes a visit from chaplain during infusion, referral made.   ONCBCN DISTRESS SCREENING 10/28/2020  Screening Type Initial Screening  Distress experienced in past week (1-10) 7  Emotional problem type Depression;Nervousness/Anxiety;Adjusting to illness  Information Concerns Type Lack of info about diagnosis;Lack of info about treatment;Lack of info about maintaining fitness  Physical Problem type Sleep/insomnia  Referral to clinical social work Yes    Clinical Social Worker follow up needed: No.  If yes, follow up plan:  Beverely Pace, [place], LCSW Clinical Social Worker Phone:  [phone]

## 2020-10-29 NOTE — Telephone Encounter (Signed)
[facility] CSW Progress Notes  Call to patient to address Distress Screen referral, left VM w my contact information and request to call back.  Edwyna Shell, LCSW Clinical Social Worker Phone:  [phone]

## 2020-10-30 ENCOUNTER — Other Ambulatory Visit: Payer: Self-pay | Admitting: Physician Assistant

## 2020-10-30 DIAGNOSIS — C3491 Malignant neoplasm of unspecified part of right bronchus or lung: Secondary | ICD-10-CM | POA: Diagnosis not present

## 2020-11-02 ENCOUNTER — Inpatient Hospital Stay (HOSPITAL_BASED_OUTPATIENT_CLINIC_OR_DEPARTMENT_OTHER): Payer: Medicare Other | Admitting: Physician Assistant

## 2020-11-02 ENCOUNTER — Inpatient Hospital Stay: Payer: Medicare Other

## 2020-11-02 ENCOUNTER — Other Ambulatory Visit: Payer: Self-pay

## 2020-11-02 VITALS — BP 145/79 | HR 60 | Temp 98.3°F | Resp 16

## 2020-11-02 VITALS — BP 135/78 | HR 61 | Temp 99.1°F | Resp 18 | Wt 126.1 lb

## 2020-11-02 DIAGNOSIS — C3491 Malignant neoplasm of unspecified part of right bronchus or lung: Secondary | ICD-10-CM

## 2020-11-02 DIAGNOSIS — C3401 Malignant neoplasm of right main bronchus: Secondary | ICD-10-CM | POA: Diagnosis not present

## 2020-11-02 DIAGNOSIS — Z5111 Encounter for antineoplastic chemotherapy: Secondary | ICD-10-CM | POA: Diagnosis not present

## 2020-11-02 DIAGNOSIS — C3490 Malignant neoplasm of unspecified part of unspecified bronchus or lung: Secondary | ICD-10-CM | POA: Insufficient documentation

## 2020-11-02 DIAGNOSIS — Z510 Encounter for antineoplastic radiation therapy: Secondary | ICD-10-CM | POA: Diagnosis present

## 2020-11-02 DIAGNOSIS — Z79899 Other long term (current) drug therapy: Secondary | ICD-10-CM | POA: Insufficient documentation

## 2020-11-02 LAB — CBC WITH DIFFERENTIAL (CANCER CENTER ONLY)
Abs Immature Granulocytes: 0.01 10*3/uL (ref 0.00–0.07)
Basophils Absolute: 0 10*3/uL (ref 0.0–0.1)
Basophils Relative: 0 %
Eosinophils Absolute: 0.1 10*3/uL (ref 0.0–0.5)
Eosinophils Relative: 1 %
HCT: 40.2 % (ref 36.0–46.0)
Hemoglobin: 13.8 g/dL (ref 12.0–15.0)
Immature Granulocytes: 0 %
Lymphocytes Relative: 33 %
Lymphs Abs: 2.1 10*3/uL (ref 0.7–4.0)
MCH: 29.7 pg (ref 26.0–34.0)
MCHC: 34.3 g/dL (ref 30.0–36.0)
MCV: 86.5 fL (ref 80.0–100.0)
Monocytes Absolute: 0.3 10*3/uL (ref 0.1–1.0)
Monocytes Relative: 5 %
Neutro Abs: 3.9 10*3/uL (ref 1.7–7.7)
Neutrophils Relative %: 61 %
Platelet Count: 144 10*3/uL — ABNORMAL LOW (ref 150–400)
RBC: 4.65 MIL/uL (ref 3.87–5.11)
RDW: 13.1 % (ref 11.5–15.5)
WBC Count: 6.4 10*3/uL (ref 4.0–10.5)
nRBC: 0 % (ref 0.0–0.2)

## 2020-11-02 LAB — CMP (CANCER CENTER ONLY)
ALT: 24 U/L (ref 0–44)
AST: 25 U/L (ref 15–41)
Albumin: 4 g/dL (ref 3.5–5.0)
Alkaline Phosphatase: 72 U/L (ref 38–126)
Anion gap: 6 (ref 5–15)
BUN: 12 mg/dL (ref 8–23)
CO2: 26 mmol/L (ref 22–32)
Calcium: 9.3 mg/dL (ref 8.9–10.3)
Chloride: 109 mmol/L (ref 98–111)
Creatinine: 0.81 mg/dL (ref 0.44–1.00)
GFR, Estimated: >60 mL/min (ref >60)
Glucose, Bld: 111 mg/dL — ABNORMAL HIGH (ref 70–99)
Potassium: 4 mmol/L (ref 3.5–5.1)
Sodium: 141 mmol/L (ref 135–145)
Total Bilirubin: 0.5 mg/dL (ref 0.3–1.2)
Total Protein: 6.6 g/dL (ref 6.5–8.1)

## 2020-11-02 MED ORDER — SODIUM CHLORIDE 0.9 % IV SOLN
10.0000 mg | Freq: Once | INTRAVENOUS | Status: AC
  Administered 2020-11-02: 10 mg via INTRAVENOUS
  Filled 2020-11-02: qty 10

## 2020-11-02 MED ORDER — SODIUM CHLORIDE 0.9 % IV SOLN
Freq: Once | INTRAVENOUS | Status: AC
  Filled 2020-11-02: qty 250

## 2020-11-02 MED ORDER — PALONOSETRON HCL INJECTION 0.25 MG/5ML
0.2500 mg | Freq: Once | INTRAVENOUS | Status: AC
Start: 2020-11-02 — End: 2020-11-02
  Administered 2020-11-02: 0.25 mg via INTRAVENOUS

## 2020-11-02 MED ORDER — SODIUM CHLORIDE 0.9 % IV SOLN
150.0000 mg | Freq: Once | INTRAVENOUS | Status: AC
  Administered 2020-11-02: 150 mg via INTRAVENOUS
  Filled 2020-11-02: qty 15

## 2020-11-02 MED ORDER — SODIUM CHLORIDE 0.9 % IV SOLN
45.0000 mg/m2 | Freq: Once | INTRAVENOUS | Status: AC
  Administered 2020-11-02: 72 mg via INTRAVENOUS
  Filled 2020-11-02: qty 12

## 2020-11-02 MED ORDER — DIPHENHYDRAMINE HCL 50 MG/ML IJ SOLN
50.0000 mg | Freq: Once | INTRAMUSCULAR | Status: AC
  Administered 2020-11-02: 50 mg via INTRAVENOUS

## 2020-11-02 MED ORDER — DIPHENHYDRAMINE HCL 50 MG/ML IJ SOLN
INTRAMUSCULAR | Status: AC
  Filled 2020-11-02: qty 1

## 2020-11-02 MED ORDER — FAMOTIDINE 20 MG IN NS 100 ML IVPB
INTRAVENOUS | Status: AC
  Filled 2020-11-02: qty 100

## 2020-11-02 MED ORDER — PALONOSETRON HCL INJECTION 0.25 MG/5ML
INTRAVENOUS | Status: AC
  Filled 2020-11-02: qty 5

## 2020-11-02 MED ORDER — FAMOTIDINE 20 MG IN NS 100 ML IVPB
20.0000 mg | Freq: Once | INTRAVENOUS | Status: AC
  Administered 2020-11-02: 20 mg via INTRAVENOUS

## 2020-11-02 NOTE — Patient Instructions (Addendum)
[facility]   Discharge Instructions: Thank you for choosing [facility] to provide your oncology and hematology care.   If you have a lab appointment with the Cancer Center, please go directly to the Cancer Center and check in at the registration area.   Wear comfortable clothing and clothing appropriate for easy access to any Portacath or PICC line.   We strive to give you quality time with your provider. You may need to reschedule your appointment if you arrive late (15 or more minutes).  Arriving late affects you and other patients whose appointments are after yours.  Also, if you miss three or more appointments without notifying the office, you may be dismissed from the clinic at the provider's discretion.      For prescription refill requests, have your pharmacy contact our office and allow 72 hours for refills to be completed.    Today you received the following chemotherapy and/or immunotherapy agents: paclitaxel and carboplatin.      To help prevent nausea and vomiting after your treatment, we encourage you to take your nausea medication as directed.  BELOW ARE SYMPTOMS THAT SHOULD BE REPORTED IMMEDIATELY: *FEVER GREATER THAN 100.4 F (38 C) OR HIGHER *CHILLS OR SWEATING *NAUSEA AND VOMITING THAT IS NOT CONTROLLED WITH YOUR NAUSEA MEDICATION *UNUSUAL SHORTNESS OF BREATH *UNUSUAL BRUISING OR BLEEDING *URINARY PROBLEMS (pain or burning when urinating, or frequent urination) *BOWEL PROBLEMS (unusual diarrhea, constipation, pain near the anus) TENDERNESS IN MOUTH AND THROAT WITH OR WITHOUT PRESENCE OF ULCERS (sore throat, sores in mouth, or a toothache) UNUSUAL RASH, SWELLING OR PAIN  UNUSUAL VAGINAL DISCHARGE OR ITCHING   Items with * indicate a potential emergency and should be followed up as soon as possible or go to the Emergency Department if any problems should occur.  Please show the CHEMOTHERAPY ALERT CARD or IMMUNOTHERAPY ALERT  CARD at check-in to the Emergency Department and triage nurse.  Should you have questions after your visit or need to cancel or reschedule your appointment, please contact [facility]  Dept: [phone]  and follow the prompts.  Office hours are 8:00 a.m. to 4:30 p.m. Monday - Friday. Please note that voicemails left after 4:00 p.m. may not be returned until the following business day.  We are closed weekends and major holidays. You have access to a nurse at all times for urgent questions. Please call the main number to the clinic Dept: [phone] and follow the prompts.   For any non-urgent questions, you may also contact your provider using MyChart. We now offer e-Visits for anyone 18 and older to request care online for non-urgent symptoms. For details visit [email or web address].   Also download the MyChart app! Go to the app store, search "MyChart", open the app, select , and log in with your MyChart username and password.  Due to Covid, a mask is required upon entering the hospital/clinic. If you do not have a mask, one will be given to you upon arrival. For doctor visits, patients may have 1 support person aged 18 or older with them. For treatment visits, patients cannot have anyone with them due to current Covid guidelines and our immunocompromised population.   Paclitaxel injection What is this medication? PACLITAXEL (PAK li TAX el) is a chemotherapy drug. It targets fast dividing cells, like cancer cells, and causes these cells to die. This medicine is used to treat ovarian cancer, breast cancer, lung cancer, Kaposi's sarcoma, and other cancers.   This medicine may be used for other purposes; ask your health care provider or pharmacist if you have questions. COMMON BRAND NAME(S): Onxol, Taxol What should I tell my care team before I take this medication? They need to know if you have any of these conditions: history of irregular heartbeat liver  disease low blood counts, like low white cell, platelet, or red cell counts lung or breathing disease, like asthma tingling of the fingers or toes, or other nerve disorder an unusual or allergic reaction to paclitaxel, alcohol, polyoxyethylated castor oil, other chemotherapy, other medicines, foods, dyes, or preservatives pregnant or trying to get pregnant breast-feeding How should I use this medication? This drug is given as an infusion into a vein. It is administered in a hospital or clinic by a specially trained health care professional. Talk to your pediatrician regarding the use of this medicine in children. Special care may be needed. Overdosage: If you think you have taken too much of this medicine contact a poison control center or emergency room at once. NOTE: This medicine is only for you. Do not share this medicine with others. What if I miss a dose? It is important not to miss your dose. Call your doctor or health care professional if you are unable to keep an appointment. What may interact with this medication? Do not take this medicine with any of the following medications: live virus vaccines This medicine may also interact with the following medications: antiviral medicines for hepatitis, HIV or AIDS certain antibiotics like erythromycin and clarithromycin certain medicines for fungal infections like ketoconazole and itraconazole certain medicines for seizures like carbamazepine, phenobarbital, phenytoin gemfibrozil nefazodone rifampin St. John's wort This list may not describe all possible interactions. Give your health care provider a list of all the medicines, herbs, non-prescription drugs, or dietary supplements you use. Also tell them if you smoke, drink alcohol, or use illegal drugs. Some items may interact with your medicine. What should I watch for while using this medication? Your condition will be monitored carefully while you are receiving this medicine. You  will need important blood work done while you are taking this medicine. This medicine can cause serious allergic reactions. To reduce your risk you will need to take other medicine(s) before treatment with this medicine. If you experience allergic reactions like skin rash, itching or hives, swelling of the face, lips, or tongue, tell your doctor or health care professional right away. In some cases, you may be given additional medicines to help with side effects. Follow all directions for their use. This drug may make you feel generally unwell. This is not uncommon, as chemotherapy can affect healthy cells as well as cancer cells. Report any side effects. Continue your course of treatment even though you feel ill unless your doctor tells you to stop. Call your doctor or health care professional for advice if you get a fever, chills or sore throat, or other symptoms of a cold or flu. Do not treat yourself. This drug decreases your body's ability to fight infections. Try to avoid being around people who are sick. This medicine may increase your risk to bruise or bleed. Call your doctor or health care professional if you notice any unusual bleeding. Be careful brushing and flossing your teeth or using a toothpick because you may get an infection or bleed more easily. If you have any dental work done, tell your dentist you are receiving this medicine. Avoid taking products that contain aspirin, acetaminophen, ibuprofen, naproxen, or ketoprofen unless   instructed by your doctor. These medicines may hide a fever. Do not become pregnant while taking this medicine. Women should inform their doctor if they wish to become pregnant or think they might be pregnant. There is a potential for serious side effects to an unborn child. Talk to your health care professional or pharmacist for more information. Do not breast-feed an infant while taking this medicine. Men are advised not to father a child while receiving this  medicine. This product may contain alcohol. Ask your pharmacist or healthcare provider if this medicine contains alcohol. Be sure to tell all healthcare providers you are taking this medicine. Certain medicines, like metronidazole and disulfiram, can cause an unpleasant reaction when taken with alcohol. The reaction includes flushing, headache, nausea, vomiting, sweating, and increased thirst. The reaction can last from 30 minutes to several hours. What side effects may I notice from receiving this medication? Side effects that you should report to your doctor or health care professional as soon as possible: allergic reactions like skin rash, itching or hives, swelling of the face, lips, or tongue breathing problems changes in vision fast, irregular heartbeat high or low blood pressure mouth sores pain, tingling, numbness in the hands or feet signs of decreased platelets or bleeding - bruising, pinpoint red spots on the skin, black, tarry stools, blood in the urine signs of decreased red blood cells - unusually weak or tired, feeling faint or lightheaded, falls signs of infection - fever or chills, cough, sore throat, pain or difficulty passing urine signs and symptoms of liver injury like dark yellow or brown urine; general ill feeling or flu-like symptoms; light-colored stools; loss of appetite; nausea; right upper belly pain; unusually weak or tired; yellowing of the eyes or skin swelling of the ankles, feet, hands unusually slow heartbeat Side effects that usually do not require medical attention (report to your doctor or health care professional if they continue or are bothersome): diarrhea hair loss loss of appetite muscle or joint pain nausea, vomiting pain, redness, or irritation at site where injected tiredness This list may not describe all possible side effects. Call your doctor for medical advice about side effects. You may report side effects to FDA at 1-800-FDA-1088. Where  should I keep my medication? This drug is given in a hospital or clinic and will not be stored at home. NOTE: This sheet is a summary. It may not cover all possible information. If you have questions about this medicine, talk to your doctor, pharmacist, or health care provider.  2022 Elsevier/Gold Standard (2019-02-20 13:37:23)  Carboplatin injection What is this medication? CARBOPLATIN (KAR boe pla tin) is a chemotherapy drug. It targets fast dividing cells, like cancer cells, and causes these cells to die. This medicine is used to treat ovarian cancer and many other cancers. This medicine may be used for other purposes; ask your health care provider or pharmacist if you have questions. COMMON BRAND NAME(S): Paraplatin What should I tell my care team before I take this medication? They need to know if you have any of these conditions: blood disorders hearing problems kidney disease recent or ongoing radiation therapy an unusual or allergic reaction to carboplatin, cisplatin, other chemotherapy, other medicines, foods, dyes, or preservatives pregnant or trying to get pregnant breast-feeding How should I use this medication? This drug is usually given as an infusion into a vein. It is administered in a hospital or clinic by a specially trained health care professional. Talk to your pediatrician regarding the use of this   medicine in children. Special care may be needed. Overdosage: If you think you have taken too much of this medicine contact a poison control center or emergency room at once. NOTE: This medicine is only for you. Do not share this medicine with others. What if I miss a dose? It is important not to miss a dose. Call your doctor or health care professional if you are unable to keep an appointment. What may interact with this medication? medicines for seizures medicines to increase blood counts like filgrastim, pegfilgrastim, sargramostim some antibiotics like amikacin,  gentamicin, neomycin, streptomycin, tobramycin vaccines Talk to your doctor or health care professional before taking any of these medicines: acetaminophen aspirin ibuprofen ketoprofen naproxen This list may not describe all possible interactions. Give your health care provider a list of all the medicines, herbs, non-prescription drugs, or dietary supplements you use. Also tell them if you smoke, drink alcohol, or use illegal drugs. Some items may interact with your medicine. What should I watch for while using this medication? Your condition will be monitored carefully while you are receiving this medicine. You will need important blood work done while you are taking this medicine. This drug may make you feel generally unwell. This is not uncommon, as chemotherapy can affect healthy cells as well as cancer cells. Report any side effects. Continue your course of treatment even though you feel ill unless your doctor tells you to stop. In some cases, you may be given additional medicines to help with side effects. Follow all directions for their use. Call your doctor or health care professional for advice if you get a fever, chills or sore throat, or other symptoms of a cold or flu. Do not treat yourself. This drug decreases your body's ability to fight infections. Try to avoid being around people who are sick. This medicine may increase your risk to bruise or bleed. Call your doctor or health care professional if you notice any unusual bleeding. Be careful brushing and flossing your teeth or using a toothpick because you may get an infection or bleed more easily. If you have any dental work done, tell your dentist you are receiving this medicine. Avoid taking products that contain aspirin, acetaminophen, ibuprofen, naproxen, or ketoprofen unless instructed by your doctor. These medicines may hide a fever. Do not become pregnant while taking this medicine. Women should inform their doctor if they wish  to become pregnant or think they might be pregnant. There is a potential for serious side effects to an unborn child. Talk to your health care professional or pharmacist for more information. Do not breast-feed an infant while taking this medicine. What side effects may I notice from receiving this medication? Side effects that you should report to your doctor or health care professional as soon as possible: allergic reactions like skin rash, itching or hives, swelling of the face, lips, or tongue signs of infection - fever or chills, cough, sore throat, pain or difficulty passing urine signs of decreased platelets or bleeding - bruising, pinpoint red spots on the skin, black, tarry stools, nosebleeds signs of decreased red blood cells - unusually weak or tired, fainting spells, lightheadedness breathing problems changes in hearing changes in vision chest pain high blood pressure low blood counts - This drug may decrease the number of white blood cells, red blood cells and platelets. You may be at increased risk for infections and bleeding. nausea and vomiting pain, swelling, redness or irritation at the injection site pain, tingling, numbness in the hands   or feet problems with balance, talking, walking trouble passing urine or change in the amount of urine Side effects that usually do not require medical attention (report to your doctor or health care professional if they continue or are bothersome): hair loss loss of appetite metallic taste in the mouth or changes in taste This list may not describe all possible side effects. Call your doctor for medical advice about side effects. You may report side effects to FDA at 1-800-FDA-1088. Where should I keep my medication? This drug is given in a hospital or clinic and will not be stored at home. NOTE: This sheet is a summary. It may not cover all possible information. If you have questions about this medicine, talk to your doctor, pharmacist,  or health care provider.  2022 Elsevier/Gold Standard (2007-06-26 14:38:05)   

## 2020-11-03 ENCOUNTER — Ambulatory Visit
Admission: RE | Admit: 2020-11-03 | Discharge: 2020-11-03 | Disposition: A | Discharge status: Home / Self Care | Payer: Medicare Other | Source: Ambulatory Visit | Attending: Radiation Oncology | Admitting: Radiation Oncology

## 2020-11-03 ENCOUNTER — Other Ambulatory Visit: Payer: Self-pay

## 2020-11-03 DIAGNOSIS — Z5111 Encounter for antineoplastic chemotherapy: Secondary | ICD-10-CM | POA: Insufficient documentation

## 2020-11-03 DIAGNOSIS — Z79899 Other long term (current) drug therapy: Secondary | ICD-10-CM | POA: Insufficient documentation

## 2020-11-03 DIAGNOSIS — C3491 Malignant neoplasm of unspecified part of right bronchus or lung: Secondary | ICD-10-CM

## 2020-11-03 DIAGNOSIS — Z510 Encounter for antineoplastic radiation therapy: Secondary | ICD-10-CM | POA: Diagnosis not present

## 2020-11-03 DIAGNOSIS — C3401 Malignant neoplasm of right main bronchus: Secondary | ICD-10-CM | POA: Insufficient documentation

## 2020-11-03 MED ORDER — SONAFINE EX EMUL
1.0000 "application " | Freq: Once | CUTANEOUS | Status: AC
  Administered 2020-11-03: 1 via TOPICAL

## 2020-11-03 NOTE — Progress Notes (Signed)
Pt here for patient teaching.    Pt given Radiation and You booklet, skin care instructions, and Sonafine.    Reviewed areas of pertinence such as fatigue, hair loss, mouth changes, skin changes, throat changes, cough, and shortness of breath .   Pt able to give teach back of to pat skin, use unscented/gentle soap, use baby wipes, and drink plenty of water,apply Sonafine bid and avoid applying anything to skin within 4 hours of treatment.   Pt verbalizes understanding of information given and will contact nursing with any questions or concerns.    [email or web address]

## 2020-11-04 ENCOUNTER — Ambulatory Visit
Admission: RE | Admit: 2020-11-04 | Discharge: 2020-11-04 | Disposition: A | Discharge status: Home / Self Care | Payer: Medicare Other | Source: Ambulatory Visit | Attending: Radiation Oncology | Admitting: Radiation Oncology

## 2020-11-04 DIAGNOSIS — Z510 Encounter for antineoplastic radiation therapy: Secondary | ICD-10-CM | POA: Diagnosis not present

## 2020-11-05 ENCOUNTER — Other Ambulatory Visit: Payer: Self-pay

## 2020-11-05 ENCOUNTER — Ambulatory Visit
Admission: RE | Admit: 2020-11-05 | Discharge: 2020-11-05 | Disposition: A | Discharge status: Home / Self Care | Payer: Medicare Other | Source: Ambulatory Visit | Attending: Radiation Oncology | Admitting: Radiation Oncology

## 2020-11-05 DIAGNOSIS — Z510 Encounter for antineoplastic radiation therapy: Secondary | ICD-10-CM | POA: Diagnosis not present

## 2020-11-06 ENCOUNTER — Ambulatory Visit
Admission: RE | Admit: 2020-11-06 | Discharge: 2020-11-06 | Disposition: A | Discharge status: Home / Self Care | Payer: Medicare Other | Source: Ambulatory Visit | Attending: Radiation Oncology | Admitting: Radiation Oncology

## 2020-11-06 DIAGNOSIS — Z510 Encounter for antineoplastic radiation therapy: Secondary | ICD-10-CM | POA: Diagnosis not present

## 2020-11-09 ENCOUNTER — Encounter: Payer: Self-pay | Admitting: General Practice

## 2020-11-09 ENCOUNTER — Ambulatory Visit
Admission: RE | Admit: 2020-11-09 | Discharge: 2020-11-09 | Disposition: A | Discharge status: Home / Self Care | Payer: Medicare Other | Source: Ambulatory Visit | Attending: Radiation Oncology | Admitting: Radiation Oncology

## 2020-11-09 ENCOUNTER — Inpatient Hospital Stay: Payer: Medicare Other

## 2020-11-09 ENCOUNTER — Other Ambulatory Visit: Payer: Self-pay | Admitting: Internal Medicine

## 2020-11-09 ENCOUNTER — Other Ambulatory Visit: Payer: Self-pay

## 2020-11-09 VITALS — BP 146/78 | HR 71 | Temp 98.4°F | Resp 18 | Wt 127.2 lb

## 2020-11-09 DIAGNOSIS — Z510 Encounter for antineoplastic radiation therapy: Secondary | ICD-10-CM | POA: Diagnosis not present

## 2020-11-09 DIAGNOSIS — C3491 Malignant neoplasm of unspecified part of right bronchus or lung: Secondary | ICD-10-CM

## 2020-11-09 LAB — CBC WITH DIFFERENTIAL (CANCER CENTER ONLY)
Abs Immature Granulocytes: 0.03 10*3/uL (ref 0.00–0.07)
Basophils Absolute: 0 10*3/uL (ref 0.0–0.1)
Basophils Relative: 1 %
Eosinophils Absolute: 0.1 10*3/uL (ref 0.0–0.5)
Eosinophils Relative: 1 %
HCT: 38.7 % (ref 36.0–46.0)
Hemoglobin: 13.1 g/dL (ref 12.0–15.0)
Immature Granulocytes: 1 %
Lymphocytes Relative: 29 %
Lymphs Abs: 1.2 10*3/uL (ref 0.7–4.0)
MCH: 29.4 pg (ref 26.0–34.0)
MCHC: 33.9 g/dL (ref 30.0–36.0)
MCV: 87 fL (ref 80.0–100.0)
Monocytes Absolute: 0.3 10*3/uL (ref 0.1–1.0)
Monocytes Relative: 6 %
Neutro Abs: 2.5 10*3/uL (ref 1.7–7.7)
Neutrophils Relative %: 62 %
Platelet Count: 138 10*3/uL — ABNORMAL LOW (ref 150–400)
RBC: 4.45 MIL/uL (ref 3.87–5.11)
RDW: 12.8 % (ref 11.5–15.5)
WBC Count: 4 10*3/uL (ref 4.0–10.5)
nRBC: 0 % (ref 0.0–0.2)

## 2020-11-09 LAB — CMP (CANCER CENTER ONLY)
ALT: 33 U/L (ref 0–44)
AST: 27 U/L (ref 15–41)
Albumin: 3.8 g/dL (ref 3.5–5.0)
Alkaline Phosphatase: 71 U/L (ref 38–126)
Anion gap: 8 (ref 5–15)
BUN: 14 mg/dL (ref 8–23)
CO2: 25 mmol/L (ref 22–32)
Calcium: 9 mg/dL (ref 8.9–10.3)
Chloride: 107 mmol/L (ref 98–111)
Creatinine: 0.77 mg/dL (ref 0.44–1.00)
GFR, Estimated: >60 mL/min (ref >60)
Glucose, Bld: 103 mg/dL — ABNORMAL HIGH (ref 70–99)
Potassium: 4.1 mmol/L (ref 3.5–5.1)
Sodium: 140 mmol/L (ref 135–145)
Total Bilirubin: 0.5 mg/dL (ref 0.3–1.2)
Total Protein: 6.5 g/dL (ref 6.5–8.1)

## 2020-11-09 MED ORDER — FAMOTIDINE 20 MG IN NS 100 ML IVPB
INTRAVENOUS | Status: AC
  Filled 2020-11-09: qty 100

## 2020-11-09 MED ORDER — PALONOSETRON HCL INJECTION 0.25 MG/5ML
INTRAVENOUS | Status: AC
  Filled 2020-11-09: qty 5

## 2020-11-09 MED ORDER — FAMOTIDINE 20 MG IN NS 100 ML IVPB
20.0000 mg | Freq: Once | INTRAVENOUS | Status: AC
  Administered 2020-11-09: 20 mg via INTRAVENOUS

## 2020-11-09 MED ORDER — SODIUM CHLORIDE 0.9 % IV SOLN
10.0000 mg | Freq: Once | INTRAVENOUS | Status: AC
  Administered 2020-11-09: 10 mg via INTRAVENOUS
  Filled 2020-11-09: qty 10

## 2020-11-09 MED ORDER — LORATADINE 10 MG PO TABS
10.0000 mg | ORAL_TABLET | Freq: Every day | ORAL | Status: DC
  Administered 2020-11-09: 10 mg via ORAL

## 2020-11-09 MED ORDER — SODIUM CHLORIDE 0.9 % IV SOLN
Freq: Once | INTRAVENOUS | Status: AC
  Filled 2020-11-09: qty 250

## 2020-11-09 MED ORDER — LORATADINE 10 MG PO TABS
ORAL_TABLET | ORAL | Status: AC
  Filled 2020-11-09: qty 1

## 2020-11-09 MED ORDER — CARBOPLATIN CHEMO INJECTION 450 MG/45ML
149.8000 mg | Freq: Once | INTRAVENOUS | Status: AC
  Administered 2020-11-09: 150 mg via INTRAVENOUS
  Filled 2020-11-09: qty 15

## 2020-11-09 MED ORDER — SODIUM CHLORIDE 0.9 % IV SOLN
45.0000 mg/m2 | Freq: Once | INTRAVENOUS | Status: AC
  Administered 2020-11-09: 72 mg via INTRAVENOUS
  Filled 2020-11-09: qty 12

## 2020-11-09 MED ORDER — PALONOSETRON HCL INJECTION 0.25 MG/5ML
0.2500 mg | Freq: Once | INTRAVENOUS | Status: AC
  Administered 2020-11-09: 0.25 mg via INTRAVENOUS

## 2020-11-09 NOTE — Progress Notes (Signed)
Patient states she is having issues with the benadryl she is receiving with tx. She states she is having a upset stomach and nausea. Dr. Julien Nordmann made aware, orders received to reduce Benadryl to 25mg  or change to Claritin, and inquire with pharmacy. Patient informed, wants to try claritin this treatment and she how she responds.

## 2020-11-09 NOTE — Patient Instructions (Signed)
[facility]  Discharge Instructions: Thank you for choosing [facility] to provide your oncology and hematology care.   If you have a lab appointment with the [facility], please go directly to the [facility] and check in at the registration area.   Wear comfortable clothing and clothing appropriate for easy access to any Portacath or PICC line.   We strive to give you quality time with your provider. You may need to reschedule your appointment if you arrive late (15 or more minutes).  Arriving late affects you and other patients whose appointments are after yours.  Also, if you miss three or more appointments without notifying the office, you may be dismissed from the clinic at the provider's discretion.      For prescription refill requests, have your pharmacy contact our office and allow 72 hours for refills to be completed.    Today you received the following chemotherapy and/or immunotherapy agents : Taxol, Carboplatin   To help prevent nausea and vomiting after your treatment, we encourage you to take your nausea medication as directed.  BELOW ARE SYMPTOMS THAT SHOULD BE REPORTED IMMEDIATELY: *FEVER GREATER THAN 100.4 F (38 C) OR HIGHER *CHILLS OR SWEATING *NAUSEA AND VOMITING THAT IS NOT CONTROLLED WITH YOUR NAUSEA MEDICATION *UNUSUAL SHORTNESS OF BREATH *UNUSUAL BRUISING OR BLEEDING *URINARY PROBLEMS (pain or burning when urinating, or frequent urination) *BOWEL PROBLEMS (unusual diarrhea, constipation, pain near the anus) TENDERNESS IN MOUTH AND THROAT WITH OR WITHOUT PRESENCE OF ULCERS (sore throat, sores in mouth, or a toothache) UNUSUAL RASH, SWELLING OR PAIN  UNUSUAL VAGINAL DISCHARGE OR ITCHING   Items with * indicate a potential emergency and should be followed up as soon as possible or go to the Emergency Department if any problems should occur.  Please show the CHEMOTHERAPY ALERT CARD or IMMUNOTHERAPY ALERT CARD at  check-in to the Emergency Department and triage nurse.  Should you have questions after your visit or need to cancel or reschedule your appointment, please contact [facility]  Dept: [phone]  and follow the prompts.  Office hours are 8:00 a.m. to 4:30 p.m. Monday - Friday. Please note that voicemails left after 4:00 p.m. may not be returned until the following business day.  We are closed weekends and major holidays. You have access to a nurse at all times for urgent questions. Please call the main number to the clinic Dept: [phone] and follow the prompts.   For any non-urgent questions, you may also contact your provider using MyChart. We now offer e-Visits for anyone 23 and older to request care online for non-urgent symptoms. For details visit [email or web address].   Also download the MyChart app! Go to the app store, search "MyChart", open the app, select [facility], and log in with your MyChart username and password.  Due to Covid, a mask is required upon entering the hospital/clinic. If you do not have a mask, one will be given to you upon arrival. For doctor visits, patients may have 1 support person aged 14 or older with them. For treatment visits, patients cannot have anyone with them due to current Covid guidelines and our immunocompromised population.

## 2020-11-09 NOTE — Progress Notes (Signed)
[facility]  Missed Katrina Campbell in infusion today, so left voicemail encouraging return call. Will reschedule opportunity to meet in person when we are able to talk.   [place], [place], [facility] Pager [phone] Voicemail [phone]

## 2020-11-10 ENCOUNTER — Ambulatory Visit
Admission: RE | Admit: 2020-11-10 | Discharge: 2020-11-10 | Disposition: A | Discharge status: Home / Self Care | Payer: Medicare Other | Source: Ambulatory Visit | Attending: Radiation Oncology | Admitting: Radiation Oncology

## 2020-11-10 DIAGNOSIS — Z510 Encounter for antineoplastic radiation therapy: Secondary | ICD-10-CM | POA: Diagnosis not present

## 2020-11-11 ENCOUNTER — Ambulatory Visit
Admission: RE | Admit: 2020-11-11 | Discharge: 2020-11-11 | Disposition: A | Discharge status: Home / Self Care | Payer: Medicare Other | Source: Ambulatory Visit | Attending: Radiation Oncology | Admitting: Radiation Oncology

## 2020-11-11 ENCOUNTER — Other Ambulatory Visit: Payer: Self-pay

## 2020-11-11 DIAGNOSIS — Z510 Encounter for antineoplastic radiation therapy: Secondary | ICD-10-CM | POA: Diagnosis not present

## 2020-11-12 ENCOUNTER — Ambulatory Visit
Admission: RE | Admit: 2020-11-12 | Discharge: 2020-11-12 | Disposition: A | Discharge status: Home / Self Care | Payer: Medicare Other | Source: Ambulatory Visit | Attending: Radiation Oncology | Admitting: Radiation Oncology

## 2020-11-12 ENCOUNTER — Telehealth: Payer: Self-pay | Admitting: Medical Oncology

## 2020-11-12 DIAGNOSIS — Z510 Encounter for antineoplastic radiation therapy: Secondary | ICD-10-CM | POA: Diagnosis not present

## 2020-11-12 NOTE — Telephone Encounter (Addendum)
Per Dr Julien Nordmann , I told pt she can take Tylenol as needed for the pain.    In addition I instructed her that if she has other symptoms with the pain ,such as  dyspnea , chest pain worse with breathing, dizziness , sweating to call EMS. She voiced understanding.

## 2020-11-12 NOTE — Telephone Encounter (Signed)
Reports worsening mid back pain  describes as "knife -like".   She had this pain prior to dx of lung cancer and was prescribed an Afflow vest from Dr. Valeta Harms which she said helped her then .   She denies any other symptoms -no dyspnea, sob.  " I feel good otherwise and want to know what I can take OTC for the pain".  She is undergoing chemo radiation.

## 2020-11-13 ENCOUNTER — Ambulatory Visit
Admission: RE | Admit: 2020-11-13 | Discharge: 2020-11-13 | Disposition: A | Discharge status: Home / Self Care | Payer: Medicare Other | Source: Ambulatory Visit | Attending: Radiation Oncology | Admitting: Radiation Oncology

## 2020-11-13 ENCOUNTER — Ambulatory Visit: Payer: Medicare Other | Admitting: Cardiology

## 2020-11-13 ENCOUNTER — Other Ambulatory Visit: Payer: Self-pay

## 2020-11-13 DIAGNOSIS — Z510 Encounter for antineoplastic radiation therapy: Secondary | ICD-10-CM | POA: Diagnosis not present

## 2020-11-16 ENCOUNTER — Other Ambulatory Visit: Payer: Self-pay

## 2020-11-16 ENCOUNTER — Ambulatory Visit
Admission: RE | Admit: 2020-11-16 | Discharge: 2020-11-16 | Disposition: A | Discharge status: Home / Self Care | Payer: Medicare Other | Source: Ambulatory Visit | Attending: Radiation Oncology | Admitting: Radiation Oncology

## 2020-11-16 DIAGNOSIS — Z510 Encounter for antineoplastic radiation therapy: Secondary | ICD-10-CM | POA: Diagnosis not present

## 2020-11-17 ENCOUNTER — Encounter: Payer: Self-pay | Admitting: Internal Medicine

## 2020-11-17 ENCOUNTER — Inpatient Hospital Stay (HOSPITAL_BASED_OUTPATIENT_CLINIC_OR_DEPARTMENT_OTHER): Payer: Medicare Other | Admitting: Internal Medicine

## 2020-11-17 ENCOUNTER — Other Ambulatory Visit: Payer: Self-pay | Admitting: Internal Medicine

## 2020-11-17 ENCOUNTER — Ambulatory Visit
Admission: RE | Admit: 2020-11-17 | Discharge: 2020-11-17 | Disposition: A | Discharge status: Home / Self Care | Payer: Medicare Other | Source: Ambulatory Visit | Attending: Radiation Oncology | Admitting: Radiation Oncology

## 2020-11-17 ENCOUNTER — Other Ambulatory Visit: Payer: Self-pay | Admitting: Radiation Oncology

## 2020-11-17 ENCOUNTER — Inpatient Hospital Stay: Payer: Medicare Other

## 2020-11-17 VITALS — BP 152/82 | HR 78 | Temp 98.1°F | Resp 19 | Ht 64.0 in | Wt 128.5 lb

## 2020-11-17 DIAGNOSIS — C3491 Malignant neoplasm of unspecified part of right bronchus or lung: Secondary | ICD-10-CM | POA: Diagnosis not present

## 2020-11-17 DIAGNOSIS — Z510 Encounter for antineoplastic radiation therapy: Secondary | ICD-10-CM | POA: Diagnosis not present

## 2020-11-17 DIAGNOSIS — Z5111 Encounter for antineoplastic chemotherapy: Secondary | ICD-10-CM

## 2020-11-17 LAB — CBC WITH DIFFERENTIAL (CANCER CENTER ONLY)
Abs Immature Granulocytes: 0.01 10*3/uL (ref 0.00–0.07)
Basophils Absolute: 0 10*3/uL (ref 0.0–0.1)
Basophils Relative: 1 %
Eosinophils Absolute: 0.1 10*3/uL (ref 0.0–0.5)
Eosinophils Relative: 1 %
HCT: 38.2 % (ref 36.0–46.0)
Hemoglobin: 12.9 g/dL (ref 12.0–15.0)
Immature Granulocytes: 0 %
Lymphocytes Relative: 26 %
Lymphs Abs: 1 10*3/uL (ref 0.7–4.0)
MCH: 29.5 pg (ref 26.0–34.0)
MCHC: 33.8 g/dL (ref 30.0–36.0)
MCV: 87.2 fL (ref 80.0–100.0)
Monocytes Absolute: 0.3 10*3/uL (ref 0.1–1.0)
Monocytes Relative: 8 %
Neutro Abs: 2.4 10*3/uL (ref 1.7–7.7)
Neutrophils Relative %: 64 %
Platelet Count: 130 10*3/uL — ABNORMAL LOW (ref 150–400)
RBC: 4.38 MIL/uL (ref 3.87–5.11)
RDW: 13.2 % (ref 11.5–15.5)
WBC Count: 3.7 10*3/uL — ABNORMAL LOW (ref 4.0–10.5)
nRBC: 0 % (ref 0.0–0.2)

## 2020-11-17 LAB — CMP (CANCER CENTER ONLY)
ALT: 36 U/L (ref 0–44)
AST: 28 U/L (ref 15–41)
Albumin: 3.9 g/dL (ref 3.5–5.0)
Alkaline Phosphatase: 74 U/L (ref 38–126)
Anion gap: 9 (ref 5–15)
BUN: 12 mg/dL (ref 8–23)
CO2: 22 mmol/L (ref 22–32)
Calcium: 9.1 mg/dL (ref 8.9–10.3)
Chloride: 111 mmol/L (ref 98–111)
Creatinine: 0.76 mg/dL (ref 0.44–1.00)
GFR, Estimated: >60 mL/min (ref >60)
Glucose, Bld: 88 mg/dL (ref 70–99)
Potassium: 4.2 mmol/L (ref 3.5–5.1)
Sodium: 142 mmol/L (ref 135–145)
Total Bilirubin: 0.4 mg/dL (ref 0.3–1.2)
Total Protein: 6.8 g/dL (ref 6.5–8.1)

## 2020-11-17 MED ORDER — PALONOSETRON HCL INJECTION 0.25 MG/5ML
0.2500 mg | Freq: Once | INTRAVENOUS | Status: AC
  Administered 2020-11-17: 0.25 mg via INTRAVENOUS
  Filled 2020-11-17: qty 5

## 2020-11-17 MED ORDER — SODIUM CHLORIDE 0.9 % IV SOLN
45.0000 mg/m2 | Freq: Once | INTRAVENOUS | Status: AC
  Administered 2020-11-17: 72 mg via INTRAVENOUS
  Filled 2020-11-17: qty 12

## 2020-11-17 MED ORDER — SUCRALFATE 1 G PO TABS: 1.0000 g | ORAL_TABLET | Freq: Three times a day (TID) | ORAL | 1 refills | Status: DC

## 2020-11-17 MED ORDER — FAMOTIDINE 20 MG IN NS 100 ML IVPB
20.0000 mg | Freq: Once | INTRAVENOUS | Status: AC
  Administered 2020-11-17: 20 mg via INTRAVENOUS
  Filled 2020-11-17: qty 100

## 2020-11-17 MED ORDER — SODIUM CHLORIDE 0.9 % IV SOLN
10.0000 mg | Freq: Once | INTRAVENOUS | Status: AC
  Administered 2020-11-17: 10 mg via INTRAVENOUS
  Filled 2020-11-17: qty 10

## 2020-11-17 MED ORDER — LORATADINE 10 MG PO TABS
10.0000 mg | ORAL_TABLET | Freq: Once | ORAL | Status: AC
  Administered 2020-11-17: 10 mg via ORAL
  Filled 2020-11-17: qty 1

## 2020-11-17 MED ORDER — SODIUM CHLORIDE 0.9 % IV SOLN
150.0000 mg | Freq: Once | INTRAVENOUS | Status: AC
  Administered 2020-11-17: 150 mg via INTRAVENOUS
  Filled 2020-11-17: qty 15

## 2020-11-17 MED ORDER — ESOMEPRAZOLE MAGNESIUM 20 MG PO CPDR: 20.0000 mg | DELAYED_RELEASE_CAPSULE | Freq: Every day | ORAL | 1 refills | Status: DC

## 2020-11-17 MED ORDER — SODIUM CHLORIDE 0.9 % IV SOLN: Freq: Once | INTRAVENOUS | Status: AC

## 2020-11-17 MED ORDER — SONAFINE EX EMUL
1.0000 "application " | Freq: Once | CUTANEOUS | Status: AC
  Administered 2020-11-17: 1 via TOPICAL

## 2020-11-17 NOTE — Patient Instructions (Addendum)
[facility]  Discharge Instructions: Thank you for choosing [facility] to provide your oncology and hematology care.   If you have a lab appointment with the Cancer Center, please go directly to the Cancer Center and check in at the registration area.   Wear comfortable clothing and clothing appropriate for easy access to any Portacath or PICC line.   We strive to give you quality time with your provider. You may need to reschedule your appointment if you arrive late (15 or more minutes).  Arriving late affects you and other patients whose appointments are after yours.  Also, if you miss three or more appointments without notifying the office, you may be dismissed from the clinic at the provider's discretion.      For prescription refill requests, have your pharmacy contact our office and allow 72 hours for refills to be completed.    Today you received the following chemotherapy and/or immunotherapy agents: Paclitaxel (Taxol) and Carboplatin.   To help prevent nausea and vomiting after your treatment, we encourage you to take your nausea medication as directed.  BELOW ARE SYMPTOMS THAT SHOULD BE REPORTED IMMEDIATELY: *FEVER GREATER THAN 100.4 F (38 C) OR HIGHER *CHILLS OR SWEATING *NAUSEA AND VOMITING THAT IS NOT CONTROLLED WITH YOUR NAUSEA MEDICATION *UNUSUAL SHORTNESS OF BREATH *UNUSUAL BRUISING OR BLEEDING *URINARY PROBLEMS (pain or burning when urinating, or frequent urination) *BOWEL PROBLEMS (unusual diarrhea, constipation, pain near the anus) TENDERNESS IN MOUTH AND THROAT WITH OR WITHOUT PRESENCE OF ULCERS (sore throat, sores in mouth, or a toothache) UNUSUAL RASH, SWELLING OR PAIN  UNUSUAL VAGINAL DISCHARGE OR ITCHING   Items with * indicate a potential emergency and should be followed up as soon as possible or go to the Emergency Department if any problems should occur.  Please show the CHEMOTHERAPY ALERT CARD or IMMUNOTHERAPY  ALERT CARD at check-in to the Emergency Department and triage nurse.  Should you have questions after your visit or need to cancel or reschedule your appointment, please contact [facility]  Dept: [phone]  and follow the prompts.  Office hours are 8:00 a.m. to 4:30 p.m. Monday - Friday. Please note that voicemails left after 4:00 p.m. may not be returned until the following business day.  We are closed weekends and major holidays. You have access to a nurse at all times for urgent questions. Please call the main number to the clinic Dept: [phone] and follow the prompts.   For any non-urgent questions, you may also contact your provider using MyChart. We now offer e-Visits for anyone 18 and older to request care online for non-urgent symptoms. For details visit [email or web address].   Also download the MyChart app! Go to the app store, search "MyChart", open the app, select [facility], and log in with your MyChart username and password.  Due to Covid, a mask is required upon entering the hospital/clinic. If you do not have a mask, one will be given to you upon arrival. For doctor visits, patients may have 1 support person aged 18 or older with them. For treatment visits, patients cannot have anyone with them due to current Covid guidelines and our immunocompromised population.   

## 2020-11-17 NOTE — Progress Notes (Signed)
[facility] [phone]   [phone]  OFFICE PROGRESS NOTE  Carlena Hurl, PA-C [address] [place] [address]  DIAGNOSIS: Stage IIIA (TX, N2, M0) non-small cell lung cancer, squamous cell carcinoma presented with subcarinal mass/lymphadenopathy diagnosed in July 2022.   PRIOR THERAPY: None   CURRENT THERAPY: Weekly carboplatin for AUC of 2 and paclitaxel 45 mg/m2.  First dose expected on 11/02/2020.  Status post 2 cycles.  INTERVAL HISTORY: Katrina Campbell 65 y.o. female returns to the clinic today for follow-up visit.  The patient is feeling fine today with no concerning complaints except for sore throat started last week.  She denied having any current chest pain, shortness of breath, cough or hemoptysis.  She denied having any dysphagia.  She has no nausea, vomiting, diarrhea or constipation.  She has no headache or visual changes.  She has no recent weight loss or night sweats.  She continues to tolerate her treatment with concurrent chemoradiation fairly well.  The patient is here today for evaluation before starting cycle #3.  MEDICAL HISTORY: Past Medical History:  Diagnosis Date   Anxiety    COPD (chronic obstructive pulmonary disease) ([facility])    Coronary artery disease    Dental staining 07/15/2019   Depression    Hyperlipidemia    Labile hypertension    Myocardial infarction ([facility])    Pneumonia    Pulmonary mycobacterial infection ([facility]) 10/24/2018    ALLERGIES:  has No Known Allergies.  MEDICATIONS:  Current Outpatient Medications  Medication Sig Dispense Refill   albuterol (PROAIR HFA) 108 (90 Base) MCG/ACT inhaler Inhale 2 puffs into the lungs every 6 (six) hours as needed for wheezing or shortness of breath. 24 g 0   albuterol (PROVENTIL) (2.5 MG/3ML) 0.083% nebulizer solution Take 3 mLs (2.5 mg total) by nebulization every 6 (six) hours as needed for wheezing or shortness of breath. 540 mL 3   aspirin EC 81 MG tablet  Take 81 mg by mouth daily with lunch.      buPROPion (WELLBUTRIN XL) 300 MG 24 hr tablet Take 1 tablet (300 mg total) by mouth daily. 90 tablet 3   Coenzyme Q10 (COQ-10) 100 MG CAPS Take 100 mg by mouth daily.     fluticasone (FLONASE) 50 MCG/ACT nasal spray Place 1-2 sprays into both nostrils as needed for allergies or rhinitis.     Guaifenesin (MUCINEX MAXIMUM STRENGTH) 1200 MG TB12 Take 1,200 mg by mouth daily.     Magnesium 500 MG TABS Take 500 mg by mouth daily with lunch.     Multiple Vitamins-Minerals (IMMUNE SUPPORT PO) Take 2 tablets by mouth daily.     prochlorperazine (COMPAZINE) 10 MG tablet Take 1 tablet (10 mg total) by mouth every 6 (six) hours as needed for nausea or vomiting. 30 tablet 0   Respiratory Therapy Supplies (FLUTTER) DEVI Use as directed 1 each 0   rosuvastatin (CRESTOR) 10 MG tablet Take 1 tablet (10 mg total) by mouth daily. 90 tablet 3   temazepam (RESTORIL) 15 MG capsule TAKE 1 CAPSULE AT BEDTIME AS NEEDED FOR SLEEP (NEED OFFICE VISIT) 90 capsule 0   umeclidinium-vilanterol (ANORO ELLIPTA) 62.5-25 MCG/INH AEPB Inhale 1 puff into the lungs daily. 180 each 3   vitamin B-12 (CYANOCOBALAMIN) 1000 MCG tablet Take 1,000 mcg by mouth daily.     No current facility-administered medications for this visit.    SURGICAL HISTORY:  Past Surgical History:  Procedure Laterality Date   BRONCHIAL  BIOPSY  10/08/2020   Procedure: BRONCHIAL BIOPSIES;  Surgeon: Garner Nash, DO;  Location: [facility];  Service: Pulmonary;;   BRONCHIAL BRUSHINGS  10/08/2020   Procedure: BRONCHIAL BRUSHINGS;  Surgeon: Garner Nash, DO;  Location: [facility];  Service: Pulmonary;;   BRONCHIAL WASHINGS  10/08/2020   Procedure: BRONCHIAL WASHINGS;  Surgeon: Garner Nash, DO;  Location: [facility];  Service: Pulmonary;;   FINE NEEDLE ASPIRATION  10/08/2020   Procedure: FINE NEEDLE ASPIRATION (FNA) LINEAR;  Surgeon: Garner Nash, DO;  Location: [facility];  Service: Pulmonary;;   LEFT  HEART CATH AND CORONARY ANGIOGRAPHY N/A 11/07/2017   Procedure: LEFT HEART CATH AND CORONARY ANGIOGRAPHY;  Surgeon: Nelva Bush, MD;  Location: [facility];  Service: Cardiovascular;  Laterality: N/A;   NASAL SINUS SURGERY  1986   VIDEO BRONCHOSCOPY Bilateral 01/29/2019   Procedure: VIDEO BRONCHOSCOPY WITH FLUORO;  Surgeon: Marshell Garfinkel, MD;  Location: [facility];  Service: Cardiopulmonary;  Laterality: Bilateral;   VIDEO BRONCHOSCOPY WITH ENDOBRONCHIAL ULTRASOUND N/A 10/08/2020   Procedure: VIDEO BRONCHOSCOPY WITH ENDOBRONCHIAL ULTRASOUND;  Surgeon: Garner Nash, DO;  Location: [facility];  Service: Pulmonary;  Laterality: N/A;    REVIEW OF SYSTEMS:  A comprehensive review of systems was negative except for: Constitutional: positive for fatigue Ears, nose, mouth, throat, and face: positive for sore throat   PHYSICAL EXAMINATION: General appearance: alert, cooperative, fatigued, and no distress Head: Normocephalic, without obvious abnormality, atraumatic Neck: no adenopathy, no JVD, supple, symmetrical, trachea midline, and thyroid not enlarged, symmetric, no tenderness/mass/nodules Lymph nodes: Cervical, supraclavicular, and axillary nodes normal. Resp: clear to auscultation bilaterally Back: symmetric, no curvature. ROM normal. No CVA tenderness. Cardio: regular rate and rhythm, S1, S2 normal, no murmur, click, rub or gallop GI: soft, non-tender; bowel sounds normal; no masses,  no organomegaly Extremities: extremities normal, atraumatic, no cyanosis or edema  ECOG PERFORMANCE STATUS: 1 - Symptomatic but completely ambulatory  Blood pressure (!) 152/82, pulse 78, temperature 98.1 F (36.7 C), temperature source Tympanic, resp. rate 19, height 5\' 4"  (1.626 m), weight 128 lb 8 oz (58.3 kg), SpO2 99 %.  LABORATORY DATA: Lab Results  Component Value Date   WBC 3.7 (L) 11/17/2020   HGB 12.9 11/17/2020   HCT 38.2 11/17/2020   MCV 87.2 11/17/2020   PLT 130 (L)  11/17/2020      Chemistry      Component Value Date/Time   NA 142 11/17/2020 0825   NA 144 02/10/2020 0813   K 4.2 11/17/2020 0825   CL 111 11/17/2020 0825   CO2 22 11/17/2020 0825   BUN 12 11/17/2020 0825   BUN 10 02/10/2020 0813   CREATININE 0.76 11/17/2020 0825   CREATININE 0.84 11/11/2019 1411      Component Value Date/Time   CALCIUM 9.1 11/17/2020 0825   ALKPHOS 74 11/17/2020 0825   AST 28 11/17/2020 0825   ALT 36 11/17/2020 0825   BILITOT 0.4 11/17/2020 0825       RADIOGRAPHIC STUDIES: No results found.  ASSESSMENT AND PLAN: This is a very pleasant 65 years old white female recently diagnosed with a stage IIIa (TX, N2, M0) non-small cell lung cancer, squamous cell carcinoma presented with subcarinal mass/lymphadenopathy diagnosed in July 2022. The patient is currently undergoing a course of concurrent chemoradiation with weekly carboplatin for AUC of 2 and paclitaxel 45 Mg/M2 status post 2 cycles. She continues to tolerate this treatment fairly well except for sore throat likely secondary to radiation treatment. I recommended  for the patient to proceed with cycle #3 today as planned. She will discussed with Dr. Sondra Come treatment for the radiation-induced sore throat and no esophagitis. The patient will come back for follow-up visit in 2 weeks for evaluation before starting cycle #5. She was advised to call immediately if she has any concerning symptoms in the interval. The patient voices understanding of current disease status and treatment options and is in agreement with the current care plan.  All questions were answered. The patient knows to call the clinic with any problems, questions or concerns. We can certainly see the patient much sooner if necessary.  The total time spent in the appointment was 20 minutes.  Disclaimer: This note was dictated with voice recognition software. Similar sounding words can inadvertently be transcribed and may not be corrected upon  review.

## 2020-11-18 ENCOUNTER — Ambulatory Visit
Admission: RE | Admit: 2020-11-18 | Discharge: 2020-11-18 | Disposition: A | Discharge status: Home / Self Care | Payer: Medicare Other | Source: Ambulatory Visit | Attending: Radiation Oncology | Admitting: Radiation Oncology

## 2020-11-18 ENCOUNTER — Other Ambulatory Visit: Payer: Self-pay

## 2020-11-18 DIAGNOSIS — Z510 Encounter for antineoplastic radiation therapy: Secondary | ICD-10-CM | POA: Diagnosis not present

## 2020-11-19 ENCOUNTER — Ambulatory Visit
Admission: RE | Admit: 2020-11-19 | Discharge: 2020-11-19 | Disposition: A | Discharge status: Home / Self Care | Payer: Medicare Other | Source: Ambulatory Visit | Attending: Radiation Oncology | Admitting: Radiation Oncology

## 2020-11-19 DIAGNOSIS — Z510 Encounter for antineoplastic radiation therapy: Secondary | ICD-10-CM | POA: Diagnosis not present

## 2020-11-20 ENCOUNTER — Other Ambulatory Visit: Payer: Self-pay

## 2020-11-20 ENCOUNTER — Ambulatory Visit
Admission: RE | Admit: 2020-11-20 | Discharge: 2020-11-20 | Disposition: A | Discharge status: Home / Self Care | Payer: Medicare Other | Source: Ambulatory Visit | Attending: Radiation Oncology | Admitting: Radiation Oncology

## 2020-11-20 DIAGNOSIS — Z510 Encounter for antineoplastic radiation therapy: Secondary | ICD-10-CM | POA: Diagnosis not present

## 2020-11-20 MED FILL — Dexamethasone Sodium Phosphate Inj 100 MG/10ML: INTRAMUSCULAR | Qty: 1 | Status: AC

## 2020-11-23 ENCOUNTER — Ambulatory Visit
Admission: RE | Admit: 2020-11-23 | Discharge: 2020-11-23 | Disposition: A | Discharge status: Home / Self Care | Payer: Medicare Other | Source: Ambulatory Visit | Attending: Radiation Oncology | Admitting: Radiation Oncology

## 2020-11-23 ENCOUNTER — Inpatient Hospital Stay: Payer: Medicare Other

## 2020-11-23 ENCOUNTER — Other Ambulatory Visit: Payer: Self-pay

## 2020-11-23 VITALS — BP 147/78 | HR 70 | Temp 98.2°F | Resp 18 | Wt 127.6 lb

## 2020-11-23 DIAGNOSIS — Z510 Encounter for antineoplastic radiation therapy: Secondary | ICD-10-CM | POA: Diagnosis not present

## 2020-11-23 DIAGNOSIS — C3491 Malignant neoplasm of unspecified part of right bronchus or lung: Secondary | ICD-10-CM

## 2020-11-23 LAB — CMP (CANCER CENTER ONLY)
ALT: 31 U/L (ref 0–44)
AST: 24 U/L (ref 15–41)
Albumin: 3.8 g/dL (ref 3.5–5.0)
Alkaline Phosphatase: 76 U/L (ref 38–126)
Anion gap: 9 (ref 5–15)
BUN: 8 mg/dL (ref 8–23)
CO2: 24 mmol/L (ref 22–32)
Calcium: 8.9 mg/dL (ref 8.9–10.3)
Chloride: 109 mmol/L (ref 98–111)
Creatinine: 0.75 mg/dL (ref 0.44–1.00)
GFR, Estimated: >60 mL/min (ref >60)
Glucose, Bld: 100 mg/dL — ABNORMAL HIGH (ref 70–99)
Potassium: 4 mmol/L (ref 3.5–5.1)
Sodium: 142 mmol/L (ref 135–145)
Total Bilirubin: 0.4 mg/dL (ref 0.3–1.2)
Total Protein: 6.5 g/dL (ref 6.5–8.1)

## 2020-11-23 LAB — CBC WITH DIFFERENTIAL (CANCER CENTER ONLY)
Abs Immature Granulocytes: 0.02 10*3/uL (ref 0.00–0.07)
Basophils Absolute: 0 10*3/uL (ref 0.0–0.1)
Basophils Relative: 1 %
Eosinophils Absolute: 0 10*3/uL (ref 0.0–0.5)
Eosinophils Relative: 0 %
HCT: 35.8 % — ABNORMAL LOW (ref 36.0–46.0)
Hemoglobin: 12.4 g/dL (ref 12.0–15.0)
Immature Granulocytes: 1 %
Lymphocytes Relative: 24 %
Lymphs Abs: 0.7 10*3/uL (ref 0.7–4.0)
MCH: 30.4 pg (ref 26.0–34.0)
MCHC: 34.6 g/dL (ref 30.0–36.0)
MCV: 87.7 fL (ref 80.0–100.0)
Monocytes Absolute: 0.2 10*3/uL (ref 0.1–1.0)
Monocytes Relative: 7 %
Neutro Abs: 2.1 10*3/uL (ref 1.7–7.7)
Neutrophils Relative %: 67 %
Platelet Count: 122 10*3/uL — ABNORMAL LOW (ref 150–400)
RBC: 4.08 MIL/uL (ref 3.87–5.11)
RDW: 13.6 % (ref 11.5–15.5)
WBC Count: 3.1 10*3/uL — ABNORMAL LOW (ref 4.0–10.5)
nRBC: 0 % (ref 0.0–0.2)

## 2020-11-23 MED ORDER — LORATADINE 10 MG PO TABS
10.0000 mg | ORAL_TABLET | Freq: Once | ORAL | Status: AC
  Administered 2020-11-23: 10 mg via ORAL
  Filled 2020-11-23: qty 1

## 2020-11-23 MED ORDER — SODIUM CHLORIDE 0.9 % IV SOLN
45.0000 mg/m2 | Freq: Once | INTRAVENOUS | Status: AC
  Administered 2020-11-23: 72 mg via INTRAVENOUS
  Filled 2020-11-23: qty 12

## 2020-11-23 MED ORDER — FAMOTIDINE 20 MG IN NS 100 ML IVPB
20.0000 mg | Freq: Once | INTRAVENOUS | Status: AC
  Administered 2020-11-23: 20 mg via INTRAVENOUS
  Filled 2020-11-23: qty 100

## 2020-11-23 MED ORDER — PALONOSETRON HCL INJECTION 0.25 MG/5ML
0.2500 mg | Freq: Once | INTRAVENOUS | Status: AC
  Administered 2020-11-23: 0.25 mg via INTRAVENOUS
  Filled 2020-11-23: qty 5

## 2020-11-23 MED ORDER — SODIUM CHLORIDE 0.9 % IV SOLN: Freq: Once | INTRAVENOUS | Status: AC

## 2020-11-23 MED ORDER — CARBOPLATIN CHEMO INJECTION 450 MG/45ML
150.0000 mg | Freq: Once | INTRAVENOUS | Status: AC
  Administered 2020-11-23: 150 mg via INTRAVENOUS
  Filled 2020-11-23: qty 15

## 2020-11-23 MED ORDER — SODIUM CHLORIDE 0.9 % IV SOLN
10.0000 mg | Freq: Once | INTRAVENOUS | Status: AC
  Administered 2020-11-23: 10 mg via INTRAVENOUS
  Filled 2020-11-23: qty 10

## 2020-11-23 NOTE — Patient Instructions (Signed)
[facility]  Discharge Instructions: Thank you for choosing [facility] to provide your oncology and hematology care.   If you have a lab appointment with the [facility], please go directly to the [facility] and check in at the registration area.   Wear comfortable clothing and clothing appropriate for easy access to any Portacath or PICC line.   We strive to give you quality time with your provider. You may need to reschedule your appointment if you arrive late (15 or more minutes).  Arriving late affects you and other patients whose appointments are after yours.  Also, if you miss three or more appointments without notifying the office, you may be dismissed from the clinic at the provider's discretion.      For prescription refill requests, have your pharmacy contact our office and allow 72 hours for refills to be completed.    Today you received the following chemotherapy and/or immunotherapy agents : Taxol, Carboplatin   To help prevent nausea and vomiting after your treatment, we encourage you to take your nausea medication as directed.  BELOW ARE SYMPTOMS THAT SHOULD BE REPORTED IMMEDIATELY: *FEVER GREATER THAN 100.4 F (38 C) OR HIGHER *CHILLS OR SWEATING *NAUSEA AND VOMITING THAT IS NOT CONTROLLED WITH YOUR NAUSEA MEDICATION *UNUSUAL SHORTNESS OF BREATH *UNUSUAL BRUISING OR BLEEDING *URINARY PROBLEMS (pain or burning when urinating, or frequent urination) *BOWEL PROBLEMS (unusual diarrhea, constipation, pain near the anus) TENDERNESS IN MOUTH AND THROAT WITH OR WITHOUT PRESENCE OF ULCERS (sore throat, sores in mouth, or a toothache) UNUSUAL RASH, SWELLING OR PAIN  UNUSUAL VAGINAL DISCHARGE OR ITCHING   Items with * indicate a potential emergency and should be followed up as soon as possible or go to the Emergency Department if any problems should occur.  Please show the CHEMOTHERAPY ALERT CARD or IMMUNOTHERAPY ALERT CARD at  check-in to the Emergency Department and triage nurse.  Should you have questions after your visit or need to cancel or reschedule your appointment, please contact [facility]  Dept: [phone]  and follow the prompts.  Office hours are 8:00 a.m. to 4:30 p.m. Monday - Friday. Please note that voicemails left after 4:00 p.m. may not be returned until the following business day.  We are closed weekends and major holidays. You have access to a nurse at all times for urgent questions. Please call the main number to the clinic Dept: [phone] and follow the prompts.   For any non-urgent questions, you may also contact your provider using MyChart. We now offer e-Visits for anyone 33 and older to request care online for non-urgent symptoms. For details visit [email or web address].   Also download the MyChart app! Go to the app store, search "MyChart", open the app, select [facility], and log in with your MyChart username and password.  Due to Covid, a mask is required upon entering the hospital/clinic. If you do not have a mask, one will be given to you upon arrival. For doctor visits, patients may have 1 support person aged 24 or older with them. For treatment visits, patients cannot have anyone with them due to current Covid guidelines and our immunocompromised population.

## 2020-11-24 ENCOUNTER — Ambulatory Visit
Admission: RE | Admit: 2020-11-24 | Discharge: 2020-11-24 | Disposition: A | Discharge status: Home / Self Care | Payer: Medicare Other | Source: Ambulatory Visit | Attending: Radiation Oncology | Admitting: Radiation Oncology

## 2020-11-24 DIAGNOSIS — Z510 Encounter for antineoplastic radiation therapy: Secondary | ICD-10-CM | POA: Diagnosis not present

## 2020-11-25 ENCOUNTER — Ambulatory Visit
Admission: RE | Admit: 2020-11-25 | Discharge: 2020-11-25 | Disposition: A | Discharge status: Home / Self Care | Payer: Medicare Other | Source: Ambulatory Visit | Attending: Radiation Oncology | Admitting: Radiation Oncology

## 2020-11-25 ENCOUNTER — Ambulatory Visit: Payer: Medicare Other | Admitting: Infectious Disease

## 2020-11-25 DIAGNOSIS — Z510 Encounter for antineoplastic radiation therapy: Secondary | ICD-10-CM | POA: Diagnosis not present

## 2020-11-26 ENCOUNTER — Other Ambulatory Visit: Payer: Self-pay

## 2020-11-26 ENCOUNTER — Ambulatory Visit
Admission: RE | Admit: 2020-11-26 | Discharge: 2020-11-26 | Disposition: A | Discharge status: Home / Self Care | Payer: Medicare Other | Source: Ambulatory Visit | Attending: Radiation Oncology | Admitting: Radiation Oncology

## 2020-11-26 DIAGNOSIS — Z510 Encounter for antineoplastic radiation therapy: Secondary | ICD-10-CM | POA: Diagnosis not present

## 2020-11-26 DIAGNOSIS — C3491 Malignant neoplasm of unspecified part of right bronchus or lung: Secondary | ICD-10-CM

## 2020-11-26 MED ORDER — SONAFINE EX EMUL
1.0000 "application " | Freq: Once | CUTANEOUS | Status: AC
  Administered 2020-11-26: 1 via TOPICAL

## 2020-11-26 MED ORDER — SONAFINE EX EMUL: 1.0000 "application " | Freq: Once | CUTANEOUS | Status: AC

## 2020-11-26 NOTE — Progress Notes (Signed)
o

## 2020-11-27 ENCOUNTER — Ambulatory Visit
Admission: RE | Admit: 2020-11-27 | Discharge: 2020-11-27 | Disposition: A | Discharge status: Home / Self Care | Payer: Medicare Other | Source: Ambulatory Visit | Attending: Radiation Oncology | Admitting: Radiation Oncology

## 2020-11-27 ENCOUNTER — Other Ambulatory Visit: Payer: Self-pay

## 2020-11-27 DIAGNOSIS — Z510 Encounter for antineoplastic radiation therapy: Secondary | ICD-10-CM | POA: Diagnosis not present

## 2020-11-27 MED FILL — Dexamethasone Sodium Phosphate Inj 100 MG/10ML: INTRAMUSCULAR | Qty: 1 | Status: AC

## 2020-11-30 ENCOUNTER — Inpatient Hospital Stay: Payer: Medicare Other

## 2020-11-30 ENCOUNTER — Encounter: Payer: Self-pay | Admitting: Internal Medicine

## 2020-11-30 ENCOUNTER — Ambulatory Visit
Admission: RE | Admit: 2020-11-30 | Discharge: 2020-11-30 | Disposition: A | Discharge status: Home / Self Care | Payer: Medicare Other | Source: Ambulatory Visit | Attending: Radiation Oncology | Admitting: Radiation Oncology

## 2020-11-30 ENCOUNTER — Other Ambulatory Visit: Payer: Self-pay

## 2020-11-30 ENCOUNTER — Inpatient Hospital Stay (HOSPITAL_BASED_OUTPATIENT_CLINIC_OR_DEPARTMENT_OTHER): Payer: Medicare Other | Admitting: Internal Medicine

## 2020-11-30 VITALS — BP 140/81 | HR 87 | Temp 98.9°F | Resp 19 | Ht 64.0 in | Wt 126.9 lb

## 2020-11-30 DIAGNOSIS — Z5111 Encounter for antineoplastic chemotherapy: Secondary | ICD-10-CM

## 2020-11-30 DIAGNOSIS — C3491 Malignant neoplasm of unspecified part of right bronchus or lung: Secondary | ICD-10-CM

## 2020-11-30 DIAGNOSIS — Z510 Encounter for antineoplastic radiation therapy: Secondary | ICD-10-CM | POA: Diagnosis not present

## 2020-11-30 LAB — CBC WITH DIFFERENTIAL (CANCER CENTER ONLY)
Abs Immature Granulocytes: 0.02 10*3/uL (ref 0.00–0.07)
Basophils Absolute: 0 10*3/uL (ref 0.0–0.1)
Basophils Relative: 1 %
Eosinophils Absolute: 0 10*3/uL (ref 0.0–0.5)
Eosinophils Relative: 1 %
HCT: 34.6 % — ABNORMAL LOW (ref 36.0–46.0)
Hemoglobin: 12.4 g/dL (ref 12.0–15.0)
Immature Granulocytes: 1 %
Lymphocytes Relative: 13 %
Lymphs Abs: 0.5 10*3/uL — ABNORMAL LOW (ref 0.7–4.0)
MCH: 30.8 pg (ref 26.0–34.0)
MCHC: 35.8 g/dL (ref 30.0–36.0)
MCV: 86.1 fL (ref 80.0–100.0)
Monocytes Absolute: 0.3 10*3/uL (ref 0.1–1.0)
Monocytes Relative: 8 %
Neutro Abs: 3.1 10*3/uL (ref 1.7–7.7)
Neutrophils Relative %: 76 %
Platelet Count: 95 10*3/uL — ABNORMAL LOW (ref 150–400)
RBC: 4.02 MIL/uL (ref 3.87–5.11)
RDW: 14 % (ref 11.5–15.5)
WBC Count: 4 10*3/uL (ref 4.0–10.5)
nRBC: 0 % (ref 0.0–0.2)

## 2020-11-30 LAB — CMP (CANCER CENTER ONLY)
ALT: 36 U/L (ref 0–44)
AST: 30 U/L (ref 15–41)
Albumin: 3.7 g/dL (ref 3.5–5.0)
Alkaline Phosphatase: 73 U/L (ref 38–126)
Anion gap: 9 (ref 5–15)
BUN: 8 mg/dL (ref 8–23)
CO2: 24 mmol/L (ref 22–32)
Calcium: 8.9 mg/dL (ref 8.9–10.3)
Chloride: 108 mmol/L (ref 98–111)
Creatinine: 0.72 mg/dL (ref 0.44–1.00)
GFR, Estimated: >60 mL/min (ref >60)
Glucose, Bld: 103 mg/dL — ABNORMAL HIGH (ref 70–99)
Potassium: 3.8 mmol/L (ref 3.5–5.1)
Sodium: 141 mmol/L (ref 135–145)
Total Bilirubin: 0.4 mg/dL (ref 0.3–1.2)
Total Protein: 6.4 g/dL — ABNORMAL LOW (ref 6.5–8.1)

## 2020-11-30 MED ORDER — SODIUM CHLORIDE 0.9 % IV SOLN
10.0000 mg | Freq: Once | INTRAVENOUS | Status: AC
  Administered 2020-11-30: 10 mg via INTRAVENOUS
  Filled 2020-11-30: qty 10

## 2020-11-30 MED ORDER — SODIUM CHLORIDE 0.9 % IV SOLN
45.0000 mg/m2 | Freq: Once | INTRAVENOUS | Status: AC
  Administered 2020-11-30: 72 mg via INTRAVENOUS
  Filled 2020-11-30: qty 12

## 2020-11-30 MED ORDER — FAMOTIDINE 20 MG IN NS 100 ML IVPB
20.0000 mg | Freq: Once | INTRAVENOUS | Status: AC
  Administered 2020-11-30: 20 mg via INTRAVENOUS
  Filled 2020-11-30: qty 100

## 2020-11-30 MED ORDER — LORATADINE 10 MG PO TABS
10.0000 mg | ORAL_TABLET | Freq: Once | ORAL | Status: AC
  Administered 2020-11-30: 10 mg via ORAL
  Filled 2020-11-30: qty 1

## 2020-11-30 MED ORDER — SODIUM CHLORIDE 0.9 % IV SOLN
149.8000 mg | Freq: Once | INTRAVENOUS | Status: AC
  Administered 2020-11-30: 150 mg via INTRAVENOUS
  Filled 2020-11-30: qty 15

## 2020-11-30 MED ORDER — SODIUM CHLORIDE 0.9 % IV SOLN: Freq: Once | INTRAVENOUS | Status: AC

## 2020-11-30 MED ORDER — PALONOSETRON HCL INJECTION 0.25 MG/5ML
0.2500 mg | Freq: Once | INTRAVENOUS | Status: AC
  Administered 2020-11-30: 0.25 mg via INTRAVENOUS
  Filled 2020-11-30: qty 5

## 2020-11-30 NOTE — Patient Instructions (Signed)
[facility]  Discharge Instructions: Thank you for choosing [facility] to provide your oncology and hematology care.   If you have a lab appointment with the [facility], please go directly to the [facility] and check in at the registration area.   Wear comfortable clothing and clothing appropriate for easy access to any Portacath or PICC line.   We strive to give you quality time with your provider. You may need to reschedule your appointment if you arrive late (15 or more minutes).  Arriving late affects you and other patients whose appointments are after yours.  Also, if you miss three or more appointments without notifying the office, you may be dismissed from the clinic at the provider's discretion.      For prescription refill requests, have your pharmacy contact our office and allow 72 hours for refills to be completed.    Today you received the following chemotherapy and/or immunotherapy agents paclitaxel, carboplatin      To help prevent nausea and vomiting after your treatment, we encourage you to take your nausea medication as directed.  BELOW ARE SYMPTOMS THAT SHOULD BE REPORTED IMMEDIATELY: *FEVER GREATER THAN 100.4 F (38 C) OR HIGHER *CHILLS OR SWEATING *NAUSEA AND VOMITING THAT IS NOT CONTROLLED WITH YOUR NAUSEA MEDICATION *UNUSUAL SHORTNESS OF BREATH *UNUSUAL BRUISING OR BLEEDING *URINARY PROBLEMS (pain or burning when urinating, or frequent urination) *BOWEL PROBLEMS (unusual diarrhea, constipation, pain near the anus) TENDERNESS IN MOUTH AND THROAT WITH OR WITHOUT PRESENCE OF ULCERS (sore throat, sores in mouth, or a toothache) UNUSUAL RASH, SWELLING OR PAIN  UNUSUAL VAGINAL DISCHARGE OR ITCHING   Items with * indicate a potential emergency and should be followed up as soon as possible or go to the Emergency Department if any problems should occur.  Please show the CHEMOTHERAPY ALERT CARD or IMMUNOTHERAPY ALERT CARD at  check-in to the Emergency Department and triage nurse.  Should you have questions after your visit or need to cancel or reschedule your appointment, please contact [facility]  Dept: [phone]  and follow the prompts.  Office hours are 8:00 a.m. to 4:30 p.m. Monday - Friday. Please note that voicemails left after 4:00 p.m. may not be returned until the following business day.  We are closed weekends and major holidays. You have access to a nurse at all times for urgent questions. Please call the main number to the clinic Dept: [phone] and follow the prompts.   For any non-urgent questions, you may also contact your provider using MyChart. We now offer e-Visits for anyone 99 and older to request care online for non-urgent symptoms. For details visit [email or web address].   Also download the MyChart app! Go to the app store, search "MyChart", open the app, select , and log in with your MyChart username and password.  Due to Covid, a mask is required upon entering the hospital/clinic. If you do not have a mask, one will be given to you upon arrival. For doctor visits, patients may have 1 support person aged 74 or older with them. For treatment visits, patients cannot have anyone with them due to current Covid guidelines and our immunocompromised population.

## 2020-11-30 NOTE — Progress Notes (Signed)
Per Dr Julien Nordmann , it is ok to treat pt today with Carboplatin and Taxol and plts of 95k.

## 2020-11-30 NOTE — Progress Notes (Signed)
Per Dr. Julien Nordmann, [place] to treat with platelets of 95. Regan Rakers, RN

## 2020-11-30 NOTE — Progress Notes (Signed)
Gave my card  with my contact information to registration with information regarding grant on back to give to patient. Patient received.

## 2020-11-30 NOTE — Progress Notes (Signed)
[place] [phone]   [phone]  OFFICE PROGRESS NOTE  Carlena Hurl, PA-C [address] [place] [address]  DIAGNOSIS: Stage IIIA (TX, N2, M0) non-small cell lung cancer, squamous cell carcinoma presented with subcarinal mass/lymphadenopathy diagnosed in July 2022.   PRIOR THERAPY: None   CURRENT THERAPY: Weekly carboplatin for AUC of 2 and paclitaxel 45 mg/m2.  First dose expected on 11/02/2020.  Status post 4 cycles.  INTERVAL HISTORY: Katrina Campbell 65 y.o. female returns to the clinic today for follow-up visit.  The patient is feeling fine today with no concerning complaints except for mild fatigue and sore throat.  She continues to tolerate her course of concurrent chemoradiation fairly well.  She denied having any chest pain, shortness of breath but has cough with no hemoptysis.  She denied having any fever or chills.  She has no nausea, vomiting, diarrhea or constipation.  She has no headache or visual changes.  She is here today for evaluation before starting cycle #5 of her treatment.  MEDICAL HISTORY: Past Medical History:  Diagnosis Date   Anxiety    COPD (chronic obstructive pulmonary disease) ([place])    Coronary artery disease    Dental staining 07/15/2019   Depression    Hyperlipidemia    Labile hypertension    Myocardial infarction ([place])    Pneumonia    Pulmonary mycobacterial infection ([place]) 10/24/2018    ALLERGIES:  is allergic to benadryl [diphenhydramine].  MEDICATIONS:  Current Outpatient Medications  Medication Sig Dispense Refill   albuterol (PROAIR HFA) 108 (90 Base) MCG/ACT inhaler Inhale 2 puffs into the lungs every 6 (six) hours as needed for wheezing or shortness of breath. 24 g 0   albuterol (PROVENTIL) (2.5 MG/3ML) 0.083% nebulizer solution Take 3 mLs (2.5 mg total) by nebulization every 6 (six) hours as needed for wheezing or shortness of breath. 540 mL 3   aspirin EC 81 MG tablet Take 81 mg by  mouth daily with lunch.      buPROPion (WELLBUTRIN XL) 300 MG 24 hr tablet Take 1 tablet (300 mg total) by mouth daily. 90 tablet 3   Coenzyme Q10 (COQ-10) 100 MG CAPS Take 100 mg by mouth daily.     esomeprazole (NEXIUM) 20 MG capsule Take 1 capsule (20 mg total) by mouth daily at 12 noon. 1 hour prior to meal 30 capsule 1   fluticasone (FLONASE) 50 MCG/ACT nasal spray Place 1-2 sprays into both nostrils as needed for allergies or rhinitis.     Guaifenesin (MUCINEX MAXIMUM STRENGTH) 1200 MG TB12 Take 1,200 mg by mouth daily.     Magnesium 500 MG TABS Take 500 mg by mouth daily with lunch.     Multiple Vitamins-Minerals (IMMUNE SUPPORT PO) Take 2 tablets by mouth daily.     prochlorperazine (COMPAZINE) 10 MG tablet Take 1 tablet (10 mg total) by mouth every 6 (six) hours as needed for nausea or vomiting. 30 tablet 0   Respiratory Therapy Supplies (FLUTTER) DEVI Use as directed 1 each 0   rosuvastatin (CRESTOR) 10 MG tablet Take 1 tablet (10 mg total) by mouth daily. 90 tablet 3   sucralfate (CARAFATE) 1 g tablet Take 1 tablet (1 g total) by mouth 4 (four) times daily -  with meals and at bedtime. Crush and dissolve in 10 mL of warm water prior to swallowing 120 tablet 1   temazepam (RESTORIL) 15 MG capsule TAKE 1 CAPSULE AT BEDTIME AS NEEDED FOR SLEEP (  NEED OFFICE VISIT) 90 capsule 0   umeclidinium-vilanterol (ANORO ELLIPTA) 62.5-25 MCG/INH AEPB Inhale 1 puff into the lungs daily. 180 each 3   vitamin B-12 (CYANOCOBALAMIN) 1000 MCG tablet Take 1,000 mcg by mouth daily.     No current facility-administered medications for this visit.   Facility-Administered Medications Ordered in Other Visits  Medication Dose Route Frequency Provider Last Rate Last Admin   Sonafine emulsion 1 application  1 application Topical Once Gery Pray, MD        SURGICAL HISTORY:  Past Surgical History:  Procedure Laterality Date   BRONCHIAL BIOPSY  10/08/2020   Procedure: BRONCHIAL BIOPSIES;  Surgeon: Garner Nash, DO;  Location: [facility];  Service: Pulmonary;;   BRONCHIAL BRUSHINGS  10/08/2020   Procedure: BRONCHIAL BRUSHINGS;  Surgeon: Garner Nash, DO;  Location: [facility];  Service: Pulmonary;;   BRONCHIAL WASHINGS  10/08/2020   Procedure: BRONCHIAL WASHINGS;  Surgeon: Garner Nash, DO;  Location: [facility];  Service: Pulmonary;;   FINE NEEDLE ASPIRATION  10/08/2020   Procedure: FINE NEEDLE ASPIRATION (FNA) LINEAR;  Surgeon: Garner Nash, DO;  Location: [facility];  Service: Pulmonary;;   LEFT HEART CATH AND CORONARY ANGIOGRAPHY N/A 11/07/2017   Procedure: LEFT HEART CATH AND CORONARY ANGIOGRAPHY;  Surgeon: Nelva Bush, MD;  Location: [facility];  Service: Cardiovascular;  Laterality: N/A;   NASAL SINUS SURGERY  1986   VIDEO BRONCHOSCOPY Bilateral 01/29/2019   Procedure: VIDEO BRONCHOSCOPY WITH FLUORO;  Surgeon: Marshell Garfinkel, MD;  Location: [facility];  Service: Cardiopulmonary;  Laterality: Bilateral;   VIDEO BRONCHOSCOPY WITH ENDOBRONCHIAL ULTRASOUND N/A 10/08/2020   Procedure: VIDEO BRONCHOSCOPY WITH ENDOBRONCHIAL ULTRASOUND;  Surgeon: Garner Nash, DO;  Location: [facility];  Service: Pulmonary;  Laterality: N/A;    REVIEW OF SYSTEMS:  A comprehensive review of systems was negative except for: Constitutional: positive for fatigue Ears, nose, mouth, throat, and face: positive for sore throat Respiratory: positive for cough   PHYSICAL EXAMINATION: General appearance: alert, cooperative, fatigued, and no distress Head: Normocephalic, without obvious abnormality, atraumatic Neck: no adenopathy, no JVD, supple, symmetrical, trachea midline, and thyroid not enlarged, symmetric, no tenderness/mass/nodules Lymph nodes: Cervical, supraclavicular, and axillary nodes normal. Resp: clear to auscultation bilaterally Back: symmetric, no curvature. ROM normal. No CVA tenderness. Cardio: regular rate and rhythm, S1, S2 normal, no murmur, click, rub or  gallop GI: soft, non-tender; bowel sounds normal; no masses,  no organomegaly Extremities: extremities normal, atraumatic, no cyanosis or edema  ECOG PERFORMANCE STATUS: 1 - Symptomatic but completely ambulatory  Blood pressure 140/81, pulse 87, temperature 98.9 F (37.2 C), temperature source Tympanic, resp. rate 19, height 5\' 4"  (1.626 m), weight 126 lb 14.4 oz (57.6 kg), SpO2 98 %.  LABORATORY DATA: Lab Results  Component Value Date   WBC 4.0 11/30/2020   HGB 12.4 11/30/2020   HCT 34.6 (L) 11/30/2020   MCV 86.1 11/30/2020   PLT 95 (L) 11/30/2020      Chemistry      Component Value Date/Time   NA 141 11/30/2020 1011   NA 144 02/10/2020 0813   K 3.8 11/30/2020 1011   CL 108 11/30/2020 1011   CO2 24 11/30/2020 1011   BUN 8 11/30/2020 1011   BUN 10 02/10/2020 0813   CREATININE 0.72 11/30/2020 1011   CREATININE 0.84 11/11/2019 1411      Component Value Date/Time   CALCIUM 8.9 11/30/2020 1011   ALKPHOS 73 11/30/2020 1011   AST 30 11/30/2020 1011  ALT 36 11/30/2020 1011   BILITOT 0.4 11/30/2020 1011       RADIOGRAPHIC STUDIES: No results found.  ASSESSMENT AND PLAN: This is a very pleasant 65 years old white female recently diagnosed with a stage IIIA (TX, N2, M0) non-small cell lung cancer, squamous cell carcinoma presented with subcarinal mass/lymphadenopathy diagnosed in July 2022. The patient is currently undergoing a course of concurrent chemoradiation with weekly carboplatin for AUC of 2 and paclitaxel 45 Mg/M2 status post 4 cycles. She is tolerating her treatment well except for dry cough as well as sore throat.  I recommended for her to proceed with cycle #5 today as planned. For the radiation-induced esophagitis and sore throat, she is currently on Carafate. I will see her back for follow-up visit in 2 weeks for evaluation close to the end of her treatment.  I will consider repeating her imaging studies at that time. The patient was advised to call immediately  if she has any other concerning symptoms in the interval. The patient voices understanding of current disease status and treatment options and is in agreement with the current care plan.  All questions were answered. The patient knows to call the clinic with any problems, questions or concerns. We can certainly see the patient much sooner if necessary.  Disclaimer: This note was dictated with voice recognition software. Similar sounding words can inadvertently be transcribed and may not be corrected upon review.

## 2020-12-01 ENCOUNTER — Telehealth: Payer: Self-pay

## 2020-12-01 ENCOUNTER — Ambulatory Visit
Admission: RE | Admit: 2020-12-01 | Discharge: 2020-12-01 | Disposition: A | Discharge status: Home / Self Care | Payer: Medicare Other | Source: Ambulatory Visit | Attending: Radiation Oncology | Admitting: Radiation Oncology

## 2020-12-01 ENCOUNTER — Other Ambulatory Visit: Payer: Self-pay | Admitting: Radiation Oncology

## 2020-12-01 DIAGNOSIS — Z510 Encounter for antineoplastic radiation therapy: Secondary | ICD-10-CM | POA: Diagnosis not present

## 2020-12-01 MED ORDER — HYDROCOD POLST-CPM POLST ER 10-8 MG/5ML PO SUER: 5.0000 mL | Freq: Every evening | ORAL | 0 refills | Status: DC | PRN

## 2020-12-01 NOTE — Telephone Encounter (Signed)
Recv'd fax from [facility] requesting 90 day refill on Rosuvastatin

## 2020-12-02 ENCOUNTER — Other Ambulatory Visit: Payer: Self-pay

## 2020-12-02 ENCOUNTER — Ambulatory Visit
Admission: RE | Admit: 2020-12-02 | Discharge: 2020-12-02 | Disposition: A | Discharge status: Home / Self Care | Payer: Medicare Other | Source: Ambulatory Visit | Attending: Radiation Oncology | Admitting: Radiation Oncology

## 2020-12-02 DIAGNOSIS — Z510 Encounter for antineoplastic radiation therapy: Secondary | ICD-10-CM | POA: Diagnosis not present

## 2020-12-02 MED ORDER — ROSUVASTATIN CALCIUM 10 MG PO TABS: 10.0000 mg | ORAL_TABLET | Freq: Every day | ORAL | 2 refills | Status: DC

## 2020-12-02 NOTE — Telephone Encounter (Signed)
done

## 2020-12-03 ENCOUNTER — Ambulatory Visit (INDEPENDENT_AMBULATORY_CARE_PROVIDER_SITE_OTHER): Payer: Medicare Other | Admitting: Internal Medicine

## 2020-12-03 ENCOUNTER — Encounter: Payer: Self-pay | Admitting: Internal Medicine

## 2020-12-03 ENCOUNTER — Ambulatory Visit
Admission: RE | Admit: 2020-12-03 | Discharge: 2020-12-03 | Disposition: A | Discharge status: Home / Self Care | Payer: Medicare Other | Source: Ambulatory Visit | Attending: Radiation Oncology | Admitting: Radiation Oncology

## 2020-12-03 VITALS — BP 120/76 | HR 81 | Ht 64.0 in | Wt 128.2 lb

## 2020-12-03 DIAGNOSIS — I712 Thoracic aortic aneurysm, without rupture, unspecified: Secondary | ICD-10-CM

## 2020-12-03 DIAGNOSIS — I2510 Atherosclerotic heart disease of native coronary artery without angina pectoris: Secondary | ICD-10-CM

## 2020-12-03 DIAGNOSIS — I252 Old myocardial infarction: Secondary | ICD-10-CM | POA: Diagnosis not present

## 2020-12-03 DIAGNOSIS — Z79899 Other long term (current) drug therapy: Secondary | ICD-10-CM | POA: Diagnosis not present

## 2020-12-03 DIAGNOSIS — C801 Malignant (primary) neoplasm, unspecified: Secondary | ICD-10-CM | POA: Insufficient documentation

## 2020-12-03 DIAGNOSIS — R070 Pain in throat: Secondary | ICD-10-CM | POA: Diagnosis not present

## 2020-12-03 DIAGNOSIS — C3491 Malignant neoplasm of unspecified part of right bronchus or lung: Secondary | ICD-10-CM | POA: Insufficient documentation

## 2020-12-03 DIAGNOSIS — Z5111 Encounter for antineoplastic chemotherapy: Secondary | ICD-10-CM | POA: Diagnosis present

## 2020-12-03 DIAGNOSIS — Z510 Encounter for antineoplastic radiation therapy: Secondary | ICD-10-CM | POA: Diagnosis present

## 2020-12-03 DIAGNOSIS — K208 Other esophagitis without bleeding: Secondary | ICD-10-CM | POA: Diagnosis not present

## 2020-12-03 NOTE — Progress Notes (Signed)
Cardiology Office Note:    Date:  12/03/2020   ID:  [name], DOB January 20, 1956, MRN [id number]  PCP:  Carlena Hurl, PA-C   [facility] Providers Cardiologist:  Werner Lean, MD     Referring MD: Carlena Hurl, PA-C   CC:  Transition to new cardiologist; cardio-onc eval  History of Present Illness:    Katrina Campbell is a 65 y.o. female with a hx of CAD, Pulmonary Mycobacterial Infection, HLD, COPD. Who presents for evaluation 12/03/20.  Oncological History notable for: Malignancies: NSCLC diagnosed 7/22 Surgery: None Chemotherapy: Carboplatin s/p 4 cycles Paclitaxel planed; potential Durvalumab start Cessations for Toxicity: None Radiation: starting Oncology care spearheaded by: Drs. Pilar Plate  Patient notes that she is doing OK all things considered.  With chemotherapy feels all loopy. No chest pain or pressure.  No SOB/DOE and no PND/Orthopnea.  No weight gain (notes weight loss) or leg swelling.  No palpitations or syncope .Feels weak but this improves with meals.    Past Medical History:  Diagnosis Date   Anxiety    COPD (chronic obstructive pulmonary disease) ([place])    Coronary artery disease    Dental staining 07/15/2019   Depression    Hyperlipidemia    Labile hypertension    Myocardial infarction [facility])    Pneumonia    Pulmonary mycobacterial infection ([place]) 10/24/2018    Past Surgical History:  Procedure Laterality Date   BRONCHIAL BIOPSY  10/08/2020   Procedure: BRONCHIAL BIOPSIES;  Surgeon: Garner Nash, DO;  Location: [facility];  Service: Pulmonary;;   BRONCHIAL BRUSHINGS  10/08/2020   Procedure: BRONCHIAL BRUSHINGS;  Surgeon: Garner Nash, DO;  Location: [facility];  Service: Pulmonary;;   BRONCHIAL WASHINGS  10/08/2020   Procedure: BRONCHIAL WASHINGS;  Surgeon: Garner Nash, DO;  Location: [facility];  Service: Pulmonary;;   FINE NEEDLE ASPIRATION  10/08/2020   Procedure: FINE NEEDLE ASPIRATION (FNA) LINEAR;  Surgeon:  Garner Nash, DO;  Location: [facility];  Service: Pulmonary;;   LEFT HEART CATH AND CORONARY ANGIOGRAPHY N/A 11/07/2017   Procedure: LEFT HEART CATH AND CORONARY ANGIOGRAPHY;  Surgeon: Nelva Bush, MD;  Location: [facility];  Service: Cardiovascular;  Laterality: N/A;   NASAL SINUS SURGERY  1986   VIDEO BRONCHOSCOPY Bilateral 01/29/2019   Procedure: VIDEO BRONCHOSCOPY WITH FLUORO;  Surgeon: Marshell Garfinkel, MD;  Location: [facility];  Service: Cardiopulmonary;  Laterality: Bilateral;   VIDEO BRONCHOSCOPY WITH ENDOBRONCHIAL ULTRASOUND N/A 10/08/2020   Procedure: VIDEO BRONCHOSCOPY WITH ENDOBRONCHIAL ULTRASOUND;  Surgeon: Garner Nash, DO;  Location: [facility];  Service: Pulmonary;  Laterality: N/A;    Current Medications: Current Meds  Medication Sig   albuterol (PROAIR HFA) 108 (90 Base) MCG/ACT inhaler Inhale 2 puffs into the lungs every 6 (six) hours as needed for wheezing or shortness of breath.   albuterol (PROVENTIL) (2.5 MG/3ML) 0.083% nebulizer solution Take 3 mLs (2.5 mg total) by nebulization every 6 (six) hours as needed for wheezing or shortness of breath.   aspirin EC 81 MG tablet Take 81 mg by mouth daily with lunch.    buPROPion (WELLBUTRIN XL) 300 MG 24 hr tablet Take 1 tablet (300 mg total) by mouth daily.   Coenzyme Q10 (COQ-10) 100 MG CAPS Take 100 mg by mouth daily.   esomeprazole (NEXIUM) 20 MG capsule Take 1 capsule (20 mg total) by mouth daily at 12 noon. 1 hour prior to meal   fluticasone (FLONASE) 50 MCG/ACT nasal spray Place 1-2 sprays  into both nostrils as needed for allergies or rhinitis.   Guaifenesin (MUCINEX MAXIMUM STRENGTH) 1200 MG TB12 Take 1,200 mg by mouth daily.   Magnesium 500 MG TABS Take 500 mg by mouth daily with lunch.   Multiple Vitamins-Minerals (IMMUNE SUPPORT PO) Take 2 tablets by mouth daily.   prochlorperazine (COMPAZINE) 10 MG tablet Take 1 tablet (10 mg total) by mouth every 6 (six) hours as needed for nausea or  vomiting.   Respiratory Therapy Supplies (FLUTTER) DEVI Use as directed   rosuvastatin (CRESTOR) 10 MG tablet Take 1 tablet (10 mg total) by mouth daily.   sucralfate (CARAFATE) 1 g tablet Take 1 tablet (1 g total) by mouth 4 (four) times daily -  with meals and at bedtime. Crush and dissolve in 10 mL of warm water prior to swallowing   temazepam (RESTORIL) 15 MG capsule TAKE 1 CAPSULE AT BEDTIME AS NEEDED FOR SLEEP (NEED OFFICE VISIT)   umeclidinium-vilanterol (ANORO ELLIPTA) 62.5-25 MCG/INH AEPB Inhale 1 puff into the lungs daily.   vitamin B-12 (CYANOCOBALAMIN) 1000 MCG tablet Take 1,000 mcg by mouth daily.     Allergies:   Benadryl [diphenhydramine] and Codeine   Social History   Socioeconomic History   Marital status: Married    Spouse name: Not on file   Number of children: Not on file   Years of education: Not on file   Highest education level: Not on file  Occupational History   Not on file  Tobacco Use   Smoking status: Former    Packs/day: 1.75    Years: 44.00    Pack years: 77.00    Types: Cigarettes    Quit date: 11/09/2012    Years since quitting: 8.0   Smokeless tobacco: Never   Tobacco comments:    vapor  Vaping Use   Vaping Use: Former  Substance and Sexual Activity   Alcohol use: Not Currently    Alcohol/week: 24.0 standard drinks    Types: 24 Cans of beer per week   Drug use: Not Currently   Sexual activity: Not Currently  Other Topics Concern   Not on file  Social History Narrative   Not on file   Social Determinants of Health   Financial Resource Strain: Medium Risk   Difficulty of Paying Living Expenses: Somewhat hard  Food Insecurity: No Food Insecurity   Worried About Charity fundraiser in the Last Year: Never true   Ran Out of Food in the Last Year: Never true  Transportation Needs: No Transportation Needs   Lack of Transportation (Medical): No   Lack of Transportation (Non-Medical): No  Physical Activity: Not on file  Stress: Stress  Concern Present   Feeling of Stress : Rather much  Social Connections: Unknown   Frequency of Communication with Friends and Family: More than three times a week   Frequency of Social Gatherings with Friends and Family: More than three times a week   Attends Religious Services: More than 4 times per year   Active Member of [employer] or Organizations: Not on file   Attends Archivist Meetings: Not on file   Marital Status: Married     Family History: The patient's family history includes Asthma in an other family member; Congestive Heart Failure in her father and mother; Emphysema in her mother; Heart attack in her father and mother; Lung disease in her brother and sister. There is no history of Colon cancer.  ROS:   Please see the history of present  illness.     All other systems reviewed and are negative.  EKGs/Labs/Other Studies Reviewed:    The following studies were reviewed today:  EKG:  EKG is  ordered today.  The ekg ordered today demonstrates  12/03/20: SR rate 81 WNL  Transthoracic Echocardiogram: Date: 03/11/2020 Results: 1. Left ventricular ejection fraction, by estimation, is 55 to 60%. The  left ventricle has normal function. The left ventricle has no regional  wall motion abnormalities. Left ventricular diastolic parameters are  consistent with Grade I diastolic  dysfunction (impaired relaxation).   2. Right ventricular systolic function is normal. The right ventricular  size is normal. Tricuspid regurgitation signal is inadequate for assessing  PA pressure.   3. The mitral valve is normal in structure. Trivial mitral valve  regurgitation. No evidence of mitral stenosis.   4. The aortic valve is tricuspid. Aortic valve regurgitation is trivial.  No aortic stenosis is present.   5. Aortic dilatation noted. There is mild dilatation of the ascending  aorta, measuring 41 mm.   6. The inferior vena cava is normal in size with greater than 50%  respiratory  variability, suggesting right atrial pressure of 3 mmHg.    NonCardiac CT: Date: 09/21/20 Results: IMPRESSION: 1. Subcarinal lymphadenopathy, increased since 06/19/2020 CT and new since 12/11/2019 CT, concerning for metastatic disease. Persistent branching tubular opacity in the anterior central right upper lobe associated with abrupt cut off of a segmental anterior right upper lobe bronchus, not substantially changed since 06/19/2020 CT. Underlying endobronchial malignancy not excluded. Multidisciplinary thoracic oncology consultation suggested. PET-CT recommended for further characterization. 2. Severe centrilobular emphysema with diffuse bronchial wall thickening, compatible with the provided history of COPD. 3. Left main and two-vessel coronary atherosclerosis. 4. Aortic Atherosclerosis (ICD10-I70.0) and Emphysema (ICD10-J43.9). 5. Mild TAA 41 mm  NM Stress Testing: Date: 06/28/2016 Results: Nuclear stress EF: 58%. Blood pressure demonstrated a normal response to exercise. There was no ST segment deviation noted during stress. Defect 1: There is a small defect of moderate severity present in the apical anterior and apex location. The study is normal. This is a low risk study. The left ventricular ejection fraction is normal (55-65%).   Normal stress nuclear study with breast attenuation but no ischemia; EF 58 with normal wall motion.  Left/Right Heart Catheterizations: Date: 11/07/2017 Results: Conclusions: Eccentric calcification with mild stenosis of the ostial LAD (~20%).  Otherwise, no angiographically significant coronary artery disease. Normal left ventricular systolic function and filling pressure.   Recommendations: Continue medical therapy and risk factor modification to prevent progression of CAD. Monitor overnight to ensure the patient does not have recurrent chest pain.  Anticipate discharge home tomorrow morning.   Recommend Aspirin 81mg  daily for mild CAD  and thoracic aortic aneurysm.    Recent Labs: 09/22/2020: Magnesium 2.2; TSH 3.690 11/30/2020: ALT 36; BUN 8; Creatinine 0.72; Hemoglobin 12.4; Platelet Count 95; Potassium 3.8; Sodium 141  Recent Lipid Panel    Component Value Date/Time   CHOL 154 08/17/2020 0753   TRIG 96 08/17/2020 0753   HDL 51 08/17/2020 0753   CHOLHDL 3.0 08/17/2020 0753   CHOLHDL 3.4 02/21/2018 0845   VLDL 38 (H) 12/30/2015 1536   LDLCALC 85 08/17/2020 0753   LDLCALC 83 02/21/2018 0845       Physical Exam:    VS:  BP 120/76   Pulse 81   Ht 5\' 4"  (1.626 m)   Wt 128 lb 3.2 oz (58.2 kg)   SpO2 98%   BMI 22.01 kg/m  Wt Readings from Last 3 Encounters:  12/03/20 128 lb 3.2 oz (58.2 kg)  11/30/20 126 lb 14.4 oz (57.6 kg)  11/23/20 127 lb 10.3 oz (57.9 kg)     GEN:  Well nourished, well developed in no acute distress HEENT: Normal NECK: No JVD; No carotid bruits LYMPHATICS: No lymphadenopathy CARDIAC: RRR, no murmurs, rubs, gallops RESPIRATORY:  Clear to auscultation without rales, wheezing or rhonchi  ABDOMEN: Soft, non-tender, non-distended MUSCULOSKELETAL:  No edema; No deformity  SKIN: Warm and dry NEUROLOGIC:  Alert and oriented x 3 PSYCHIATRIC:  Normal affect   ASSESSMENT:    1. Coronary artery disease involving native coronary artery of native heart without angina pectoris   2. Malignant small cell cancer ([place])   3. Thoracic aortic aneurysm without rupture (H[facility])   4. Stage III squamous cell carcinoma of right lung ([facility])    PLAN:     CAD and HLD NSCLC getting chemo, radiation, and immunotherapy Mild TAA 41 mm - will get echo now and likely in six months for monitoring on chemo then monitoring on immunotherapy - continue ASA 81 mg PO daily - LDL goal < 70; rosuvastatin 10 mg PO daily  - pts CoQ-10 is reasonable - discussed sx of HF and pericarditis given planned medications   6 month f/u  Medication Adjustments/Labs and Tests Ordered: Current medicines are reviewed at length  with the patient today.  Concerns regarding medicines are outlined above.  Orders Placed This Encounter  Procedures   Lipid panel   ALT   EKG 12-Lead   ECHOCARDIOGRAM COMPLETE    No orders of the defined types were placed in this encounter.   Patient Instructions  Medication Instructions:  Your physician has recommended you make the following change in your medication:  INCREASE: rosuvastatin (Crestor) to 20 mg by mouth daily *If you need a refill on your cardiac medications before your next appointment, please call your pharmacy*   Lab Work: IN 3 MONTHS: Fasting lipid panel (nothing to eat or drink 8 hours prior) and ALT If you have labs (blood work) drawn today and your tests are completely normal, you will receive your results only by: [place] (if you have MyChart) OR A paper copy in the mail If you have any lab test that is abnormal or we need to change your treatment, we will call you to review the results.   Testing/Procedures: Your physician has requested that you have an echocardiogram. Echocardiography is a painless test that uses sound waves to create images of your heart. It provides your doctor with information about the size and shape of your heart and how well your heart's chambers and valves are working. This procedure takes approximately one hour. There are no restrictions for this procedure.    Follow-Up: At [facility], you and your health needs are our priority.  As part of our continuing mission to provide you with exceptional heart care, we have created designated Provider Care Teams.  These Care Teams include your primary Cardiologist (physician) and Advanced Practice Providers (APPs -  Physician Assistants and Nurse Practitioners) who all work together to provide you with the care you need, when you need it.      Your next appointment:   6 month(s)  The format for your next appointment:   In Person  Provider:   You may see Werner Lean, MD or one of the following Advanced Practice Providers on your designated Care Team:   Melina Copa, PA-C Selinda Eon  Bonnell Public, PA-C        Signed, Werner Lean, MD  12/03/2020 4:46 PM    [place]

## 2020-12-03 NOTE — Patient Instructions (Signed)
Medication Instructions:  Your physician has recommended you make the following change in your medication:  INCREASE: rosuvastatin (Crestor) to 20 mg by mouth daily *If you need a refill on your cardiac medications before your next appointment, please call your pharmacy*   Lab Work: IN 3 MONTHS: Fasting lipid panel (nothing to eat or drink 8 hours prior) and ALT If you have labs (blood work) drawn today and your tests are completely normal, you will receive your results only by: [place] (if you have MyChart) OR A paper copy in the mail If you have any lab test that is abnormal or we need to change your treatment, we will call you to review the results.   Testing/Procedures: Your physician has requested that you have an echocardiogram. Echocardiography is a painless test that uses sound waves to create images of your heart. It provides your doctor with information about the size and shape of your heart and how well your heart's chambers and valves are working. This procedure takes approximately one hour. There are no restrictions for this procedure.    Follow-Up: At [facility], you and your health needs are our priority.  As part of our continuing mission to provide you with exceptional heart care, we have created designated Provider Care Teams.  These Care Teams include your primary Cardiologist (physician) and Advanced Practice Providers (APPs -  Physician Assistants and Nurse Practitioners) who all work together to provide you with the care you need, when you need it.      Your next appointment:   6 month(s)  The format for your next appointment:   In Person  Provider:   You may see Werner Lean, MD or one of the following Advanced Practice Providers on your designated Care Team:   Melina Copa, PA-C Ermalinda Barrios, PA-C

## 2020-12-04 ENCOUNTER — Ambulatory Visit
Admission: RE | Admit: 2020-12-04 | Discharge: 2020-12-04 | Disposition: A | Discharge status: Home / Self Care | Payer: Medicare Other | Source: Ambulatory Visit | Attending: Radiation Oncology | Admitting: Radiation Oncology

## 2020-12-04 DIAGNOSIS — Z5111 Encounter for antineoplastic chemotherapy: Secondary | ICD-10-CM | POA: Diagnosis not present

## 2020-12-04 DIAGNOSIS — C3491 Malignant neoplasm of unspecified part of right bronchus or lung: Secondary | ICD-10-CM

## 2020-12-04 DIAGNOSIS — C801 Malignant (primary) neoplasm, unspecified: Secondary | ICD-10-CM

## 2020-12-04 MED ORDER — SONAFINE EX EMUL
1.0000 "application " | Freq: Once | CUTANEOUS | Status: AC
  Administered 2020-12-04: 1 via TOPICAL

## 2020-12-04 MED FILL — Dexamethasone Sodium Phosphate Inj 100 MG/10ML: INTRAMUSCULAR | Qty: 1 | Status: AC

## 2020-12-08 ENCOUNTER — Other Ambulatory Visit: Payer: Self-pay

## 2020-12-08 ENCOUNTER — Inpatient Hospital Stay: Payer: Medicare Other

## 2020-12-08 ENCOUNTER — Ambulatory Visit
Admission: RE | Admit: 2020-12-08 | Discharge: 2020-12-08 | Disposition: A | Discharge status: Home / Self Care | Payer: Medicare Other | Source: Ambulatory Visit | Attending: Radiation Oncology | Admitting: Radiation Oncology

## 2020-12-08 ENCOUNTER — Inpatient Hospital Stay: Payer: Medicare Other | Attending: Physician Assistant

## 2020-12-08 VITALS — BP 137/83 | HR 83 | Temp 98.9°F | Resp 18 | Ht 64.0 in | Wt 128.5 lb

## 2020-12-08 DIAGNOSIS — I252 Old myocardial infarction: Secondary | ICD-10-CM | POA: Insufficient documentation

## 2020-12-08 DIAGNOSIS — Z79899 Other long term (current) drug therapy: Secondary | ICD-10-CM | POA: Insufficient documentation

## 2020-12-08 DIAGNOSIS — Z510 Encounter for antineoplastic radiation therapy: Secondary | ICD-10-CM | POA: Insufficient documentation

## 2020-12-08 DIAGNOSIS — C3491 Malignant neoplasm of unspecified part of right bronchus or lung: Secondary | ICD-10-CM | POA: Insufficient documentation

## 2020-12-08 DIAGNOSIS — Z5111 Encounter for antineoplastic chemotherapy: Secondary | ICD-10-CM | POA: Diagnosis not present

## 2020-12-08 DIAGNOSIS — R070 Pain in throat: Secondary | ICD-10-CM | POA: Insufficient documentation

## 2020-12-08 DIAGNOSIS — K208 Other esophagitis without bleeding: Secondary | ICD-10-CM | POA: Insufficient documentation

## 2020-12-08 LAB — CBC WITH DIFFERENTIAL (CANCER CENTER ONLY)
Abs Immature Granulocytes: 0.01 10*3/uL (ref 0.00–0.07)
Basophils Absolute: 0 10*3/uL (ref 0.0–0.1)
Basophils Relative: 0 %
Eosinophils Absolute: 0 10*3/uL (ref 0.0–0.5)
Eosinophils Relative: 1 %
HCT: 34 % — ABNORMAL LOW (ref 36.0–46.0)
Hemoglobin: 12 g/dL (ref 12.0–15.0)
Immature Granulocytes: 0 %
Lymphocytes Relative: 19 %
Lymphs Abs: 0.5 10*3/uL — ABNORMAL LOW (ref 0.7–4.0)
MCH: 30.6 pg (ref 26.0–34.0)
MCHC: 35.3 g/dL (ref 30.0–36.0)
MCV: 86.7 fL (ref 80.0–100.0)
Monocytes Absolute: 0.3 10*3/uL (ref 0.1–1.0)
Monocytes Relative: 10 %
Neutro Abs: 1.7 10*3/uL (ref 1.7–7.7)
Neutrophils Relative %: 70 %
Platelet Count: 83 10*3/uL — ABNORMAL LOW (ref 150–400)
RBC: 3.92 MIL/uL (ref 3.87–5.11)
RDW: 14.6 % (ref 11.5–15.5)
WBC Count: 2.4 10*3/uL — ABNORMAL LOW (ref 4.0–10.5)
nRBC: 0 % (ref 0.0–0.2)

## 2020-12-08 LAB — CMP (CANCER CENTER ONLY)
ALT: 41 U/L (ref 0–44)
AST: 29 U/L (ref 15–41)
Albumin: 3.9 g/dL (ref 3.5–5.0)
Alkaline Phosphatase: 81 U/L (ref 38–126)
Anion gap: 9 (ref 5–15)
BUN: 11 mg/dL (ref 8–23)
CO2: 26 mmol/L (ref 22–32)
Calcium: 9.3 mg/dL (ref 8.9–10.3)
Chloride: 106 mmol/L (ref 98–111)
Creatinine: 0.73 mg/dL (ref 0.44–1.00)
GFR, Estimated: >60 mL/min (ref >60)
Glucose, Bld: 81 mg/dL (ref 70–99)
Potassium: 3.8 mmol/L (ref 3.5–5.1)
Sodium: 141 mmol/L (ref 135–145)
Total Bilirubin: 0.4 mg/dL (ref 0.3–1.2)
Total Protein: 6.7 g/dL (ref 6.5–8.1)

## 2020-12-08 MED ORDER — LORATADINE 10 MG PO TABS
10.0000 mg | ORAL_TABLET | Freq: Every day | ORAL | Status: DC
  Administered 2020-12-08: 10 mg via ORAL
  Filled 2020-12-08: qty 1

## 2020-12-08 MED ORDER — FAMOTIDINE 20 MG IN NS 100 ML IVPB
20.0000 mg | Freq: Once | INTRAVENOUS | Status: AC
  Administered 2020-12-08: 20 mg via INTRAVENOUS
  Filled 2020-12-08: qty 100

## 2020-12-08 MED ORDER — PALONOSETRON HCL INJECTION 0.25 MG/5ML
0.2500 mg | Freq: Once | INTRAVENOUS | Status: AC
  Administered 2020-12-08: 0.25 mg via INTRAVENOUS
  Filled 2020-12-08: qty 5

## 2020-12-08 MED ORDER — HEPARIN SOD (PORK) LOCK FLUSH 100 UNIT/ML IV SOLN: 500.0000 [IU] | Freq: Once | INTRAVENOUS | Status: DC | PRN

## 2020-12-08 MED ORDER — SODIUM CHLORIDE 0.9 % IV SOLN
10.0000 mg | Freq: Once | INTRAVENOUS | Status: AC
  Administered 2020-12-08: 10 mg via INTRAVENOUS
  Filled 2020-12-08: qty 10

## 2020-12-08 MED ORDER — SODIUM CHLORIDE 0.9 % IV SOLN
45.0000 mg/m2 | Freq: Once | INTRAVENOUS | Status: AC
  Administered 2020-12-08: 72 mg via INTRAVENOUS
  Filled 2020-12-08: qty 12

## 2020-12-08 MED ORDER — SODIUM CHLORIDE 0.9 % IV SOLN
149.8000 mg | Freq: Once | INTRAVENOUS | Status: AC
  Administered 2020-12-08: 150 mg via INTRAVENOUS
  Filled 2020-12-08: qty 15

## 2020-12-08 MED ORDER — SODIUM CHLORIDE 0.9 % IV SOLN: Freq: Once | INTRAVENOUS | Status: AC

## 2020-12-08 MED ORDER — SODIUM CHLORIDE 0.9% FLUSH
10.0000 mL | INTRAVENOUS | Status: DC | PRN
Start: 2020-12-08 — End: 2020-12-08

## 2020-12-08 NOTE — Patient Instructions (Signed)
[facility]  Discharge Instructions: Thank you for choosing [facility] to provide your oncology and hematology care.   If you have a lab appointment with the Cancer Center, please go directly to the Cancer Center and check in at the registration area.   Wear comfortable clothing and clothing appropriate for easy access to any Portacath or PICC line.   We strive to give you quality time with your provider. You may need to reschedule your appointment if you arrive late (15 or more minutes).  Arriving late affects you and other patients whose appointments are after yours.  Also, if you miss three or more appointments without notifying the office, you may be dismissed from the clinic at the provider's discretion.      For prescription refill requests, have your pharmacy contact our office and allow 72 hours for refills to be completed.    Today you received the following chemotherapy and/or immunotherapy agents Taxol and Carboplatin       To help prevent nausea and vomiting after your treatment, we encourage you to take your nausea medication as directed.  BELOW ARE SYMPTOMS THAT SHOULD BE REPORTED IMMEDIATELY: *FEVER GREATER THAN 100.4 F (38 C) OR HIGHER *CHILLS OR SWEATING *NAUSEA AND VOMITING THAT IS NOT CONTROLLED WITH YOUR NAUSEA MEDICATION *UNUSUAL SHORTNESS OF BREATH *UNUSUAL BRUISING OR BLEEDING *URINARY PROBLEMS (pain or burning when urinating, or frequent urination) *BOWEL PROBLEMS (unusual diarrhea, constipation, pain near the anus) TENDERNESS IN MOUTH AND THROAT WITH OR WITHOUT PRESENCE OF ULCERS (sore throat, sores in mouth, or a toothache) UNUSUAL RASH, SWELLING OR PAIN  UNUSUAL VAGINAL DISCHARGE OR ITCHING   Items with * indicate a potential emergency and should be followed up as soon as possible or go to the Emergency Department if any problems should occur.  Please show the CHEMOTHERAPY ALERT CARD or IMMUNOTHERAPY ALERT CARD at  check-in to the Emergency Department and triage nurse.  Should you have questions after your visit or need to cancel or reschedule your appointment, please contact [facility]  Dept: [phone]  and follow the prompts.  Office hours are 8:00 a.m. to 4:30 p.m. Monday - Friday. Please note that voicemails left after 4:00 p.m. may not be returned until the following business day.  We are closed weekends and major holidays. You have access to a nurse at all times for urgent questions. Please call the main number to the clinic Dept: [phone] and follow the prompts.   For any non-urgent questions, you may also contact your provider using MyChart. We now offer e-Visits for anyone 18 and older to request care online for non-urgent symptoms. For details visit [email or web address].   Also download the MyChart app! Go to the app store, search "MyChart", open the app, select [facility], and log in with your MyChart username and password.  Due to Covid, a mask is required upon entering the hospital/clinic. If you do not have a mask, one will be given to you upon arrival. For doctor visits, patients may have 1 support person aged 18 or older with them. For treatment visits, patients cannot have anyone with them due to current Covid guidelines and our immunocompromised population.   

## 2020-12-08 NOTE — Progress Notes (Signed)
Platelets 83.  Per MD ok to treat.

## 2020-12-09 ENCOUNTER — Ambulatory Visit
Admission: RE | Admit: 2020-12-09 | Discharge: 2020-12-09 | Disposition: A | Discharge status: Home / Self Care | Payer: Medicare Other | Source: Ambulatory Visit | Attending: Radiation Oncology | Admitting: Radiation Oncology

## 2020-12-09 DIAGNOSIS — Z5111 Encounter for antineoplastic chemotherapy: Secondary | ICD-10-CM | POA: Diagnosis not present

## 2020-12-10 ENCOUNTER — Ambulatory Visit
Admission: RE | Admit: 2020-12-10 | Discharge: 2020-12-10 | Disposition: A | Discharge status: Home / Self Care | Payer: Medicare Other | Source: Ambulatory Visit | Attending: Radiation Oncology | Admitting: Radiation Oncology

## 2020-12-10 ENCOUNTER — Other Ambulatory Visit: Payer: Self-pay

## 2020-12-10 DIAGNOSIS — Z5111 Encounter for antineoplastic chemotherapy: Secondary | ICD-10-CM | POA: Diagnosis not present

## 2020-12-10 MED ORDER — ROSUVASTATIN CALCIUM 20 MG PO TABS: 20.0000 mg | ORAL_TABLET | Freq: Every day | ORAL | 3 refills | Status: DC

## 2020-12-10 NOTE — Telephone Encounter (Signed)
Order placed for rosuvastatin 20 mg PO QD per 12/03/20 OV.

## 2020-12-11 ENCOUNTER — Ambulatory Visit
Admission: RE | Admit: 2020-12-11 | Discharge: 2020-12-11 | Disposition: A | Discharge status: Home / Self Care | Payer: Medicare Other | Source: Ambulatory Visit | Attending: Radiation Oncology | Admitting: Radiation Oncology

## 2020-12-11 ENCOUNTER — Other Ambulatory Visit: Payer: Self-pay

## 2020-12-11 DIAGNOSIS — Z5111 Encounter for antineoplastic chemotherapy: Secondary | ICD-10-CM | POA: Diagnosis not present

## 2020-12-11 MED FILL — Dexamethasone Sodium Phosphate Inj 100 MG/10ML: INTRAMUSCULAR | Qty: 1 | Status: AC

## 2020-12-14 ENCOUNTER — Other Ambulatory Visit: Payer: Self-pay

## 2020-12-14 ENCOUNTER — Encounter: Payer: Self-pay | Admitting: Internal Medicine

## 2020-12-14 ENCOUNTER — Encounter: Payer: Self-pay | Admitting: Radiation Oncology

## 2020-12-14 ENCOUNTER — Inpatient Hospital Stay (HOSPITAL_BASED_OUTPATIENT_CLINIC_OR_DEPARTMENT_OTHER): Payer: Medicare Other | Admitting: Internal Medicine

## 2020-12-14 ENCOUNTER — Inpatient Hospital Stay: Payer: Medicare Other

## 2020-12-14 ENCOUNTER — Ambulatory Visit
Admission: RE | Admit: 2020-12-14 | Discharge: 2020-12-14 | Disposition: A | Discharge status: Home / Self Care | Payer: Medicare Other | Source: Ambulatory Visit | Attending: Radiation Oncology | Admitting: Radiation Oncology

## 2020-12-14 VITALS — BP 135/91 | HR 105 | Temp 99.3°F | Resp 19 | Ht 64.0 in | Wt 128.1 lb

## 2020-12-14 DIAGNOSIS — C3490 Malignant neoplasm of unspecified part of unspecified bronchus or lung: Secondary | ICD-10-CM

## 2020-12-14 DIAGNOSIS — C3491 Malignant neoplasm of unspecified part of right bronchus or lung: Secondary | ICD-10-CM

## 2020-12-14 DIAGNOSIS — Z5111 Encounter for antineoplastic chemotherapy: Secondary | ICD-10-CM | POA: Diagnosis not present

## 2020-12-14 LAB — CMP (CANCER CENTER ONLY)
ALT: 33 U/L (ref 0–44)
AST: 24 U/L (ref 15–41)
Albumin: 3.6 g/dL (ref 3.5–5.0)
Alkaline Phosphatase: 84 U/L (ref 38–126)
Anion gap: 8 (ref 5–15)
BUN: 12 mg/dL (ref 8–23)
CO2: 25 mmol/L (ref 22–32)
Calcium: 9.1 mg/dL (ref 8.9–10.3)
Chloride: 107 mmol/L (ref 98–111)
Creatinine: 0.7 mg/dL (ref 0.44–1.00)
GFR, Estimated: >60 mL/min (ref >60)
Glucose, Bld: 93 mg/dL (ref 70–99)
Potassium: 4.2 mmol/L (ref 3.5–5.1)
Sodium: 140 mmol/L (ref 135–145)
Total Bilirubin: 0.4 mg/dL (ref 0.3–1.2)
Total Protein: 6.4 g/dL — ABNORMAL LOW (ref 6.5–8.1)

## 2020-12-14 LAB — CBC WITH DIFFERENTIAL (CANCER CENTER ONLY)
Abs Immature Granulocytes: 0.01 10*3/uL (ref 0.00–0.07)
Basophils Absolute: 0 10*3/uL (ref 0.0–0.1)
Basophils Relative: 0 %
Eosinophils Absolute: 0 10*3/uL (ref 0.0–0.5)
Eosinophils Relative: 1 %
HCT: 32.8 % — ABNORMAL LOW (ref 36.0–46.0)
Hemoglobin: 11.4 g/dL — ABNORMAL LOW (ref 12.0–15.0)
Immature Granulocytes: 1 %
Lymphocytes Relative: 27 %
Lymphs Abs: 0.4 10*3/uL — ABNORMAL LOW (ref 0.7–4.0)
MCH: 30.9 pg (ref 26.0–34.0)
MCHC: 34.8 g/dL (ref 30.0–36.0)
MCV: 88.9 fL (ref 80.0–100.0)
Monocytes Absolute: 0.2 10*3/uL (ref 0.1–1.0)
Monocytes Relative: 10 %
Neutro Abs: 1 10*3/uL — ABNORMAL LOW (ref 1.7–7.7)
Neutrophils Relative %: 61 %
Platelet Count: 98 10*3/uL — ABNORMAL LOW (ref 150–400)
RBC: 3.69 MIL/uL — ABNORMAL LOW (ref 3.87–5.11)
RDW: 14.7 % (ref 11.5–15.5)
WBC Count: 1.6 10*3/uL — ABNORMAL LOW (ref 4.0–10.5)
nRBC: 0 % (ref 0.0–0.2)

## 2020-12-14 NOTE — Progress Notes (Signed)
[facility] Telephone:([phone]) [phone]   Fax:([phone]  OFFICE PROGRESS NOTE  Carlena Hurl, PA-C [address] [place] [address]  DIAGNOSIS: Stage IIIA (TX, N2, M0) non-small cell lung cancer, squamous cell carcinoma presented with subcarinal mass/lymphadenopathy diagnosed in July 2022.   PRIOR THERAPY: None   CURRENT THERAPY: Weekly carboplatin for AUC of 2 and paclitaxel 45 mg/m2.  First dose expected on 11/02/2020.  Status post 6 cycles.  INTERVAL HISTORY: Katrina Campbell 65 y.o. female returns to the clinic today for follow-up visit.  The patient is feeling fine today with no concerning complaints except for the sore throat and she is currently on Carafate.  She also has harsh cough with laser sharp pain when she swallows.  She denied having any current chest pain, shortness of breath or hemoptysis.  She has mild fatigue.  She denied having any nausea, vomiting, diarrhea or constipation.  She denied having any fever or chills.  She has no significant weight loss or night sweats.  She is finishing her radiotherapy tomorrow.  She was here today for evaluation before starting cycle #7 of her treatment with chemotherapy.   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  MEDICATIONS:  Current Outpatient Medications  Medication Sig Dispense Refill   albuterol (PROAIR HFA) 108 (90 Base) MCG/ACT inhaler Inhale 2 puffs into the lungs every 6 (six) hours as needed for wheezing or shortness of breath. 24 g 0   albuterol (PROVENTIL) (2.5 MG/3ML) 0.083% nebulizer solution Take 3 mLs (2.5 mg total) by  nebulization every 6 (six) hours as needed for wheezing or shortness of breath. 540 mL 3   aspirin EC 81 MG tablet Take 81 mg by mouth daily with lunch.      buPROPion (WELLBUTRIN XL) 300 MG 24 hr tablet Take 1 tablet (300 mg total) by mouth daily. 90 tablet 3   Coenzyme Q10 (COQ-10) 100 MG CAPS Take 100 mg by mouth daily.     esomeprazole (NEXIUM) 20 MG capsule Take 1 capsule (20 mg total) by mouth daily at 12 noon. 1 hour prior to meal 30 capsule 1   fluticasone (FLONASE) 50 MCG/ACT nasal spray Place 1-2 sprays into both nostrils as needed for allergies or rhinitis.     Guaifenesin (MUCINEX MAXIMUM STRENGTH) 1200 MG TB12 Take 1,200 mg by mouth daily.     Magnesium 500 MG TABS Take 500 mg by mouth daily with lunch.     Multiple Vitamins-Minerals (IMMUNE SUPPORT PO) Take 2 tablets by mouth daily.     prochlorperazine (COMPAZINE) 10 MG tablet Take 1 tablet (10 mg total) by mouth every 6 (six) hours as needed for nausea or vomiting. 30 tablet 0   Respiratory Therapy Supplies (FLUTTER) DEVI Use as directed 1 each 0   rosuvastatin (CRESTOR) 20 MG tablet Take 1 tablet (20 mg total) by mouth daily. 90 tablet 3   sucralfate (CARAFATE) 1 g tablet Take 1 tablet (1 g total) by mouth 4 (four) times daily -  with meals and at bedtime. Crush and dissolve in 10 mL of warm water  prior to swallowing 120 tablet 1   temazepam (RESTORIL) 15 MG capsule TAKE 1 CAPSULE AT BEDTIME AS NEEDED FOR SLEEP (NEED OFFICE VISIT) 90 capsule 0   umeclidinium-vilanterol (ANORO ELLIPTA) 62.5-25 MCG/INH AEPB Inhale 1 puff into the lungs daily. 180 each 3   vitamin B-12 (CYANOCOBALAMIN) 1000 MCG tablet Take 1,000 mcg by mouth daily.     No current facility-administered medications for this visit.   Facility-Administered Medications Ordered in Other Visits  Medication Dose Route Frequency Provider Last Rate Last Admin   Sonafine emulsion 1 application  1 application Topical Once Gery Pray, MD        SURGICAL HISTORY:  Past  Surgical History:  Procedure Laterality Date   BRONCHIAL BIOPSY  10/08/2020   Procedure: BRONCHIAL BIOPSIES;  Surgeon: Garner Nash, DO;  Location: [facility];  Service: Pulmonary;;   BRONCHIAL BRUSHINGS  10/08/2020   Procedure: BRONCHIAL BRUSHINGS;  Surgeon: Garner Nash, DO;  Location: [facility];  Service: Pulmonary;;   BRONCHIAL WASHINGS  10/08/2020   Procedure: BRONCHIAL WASHINGS;  Surgeon: Garner Nash, DO;  Location: [facility];  Service: Pulmonary;;   FINE NEEDLE ASPIRATION  10/08/2020   Procedure: FINE NEEDLE ASPIRATION (FNA) LINEAR;  Surgeon: Garner Nash, DO;  Location: [facility];  Service: Pulmonary;;   LEFT HEART CATH AND CORONARY ANGIOGRAPHY N/A 11/07/2017   Procedure: LEFT HEART CATH AND CORONARY ANGIOGRAPHY;  Surgeon: Nelva Bush, MD;  Location: [facility];  Service: Cardiovascular;  Laterality: N/A;   NASAL SINUS SURGERY  1986   VIDEO BRONCHOSCOPY Bilateral 01/29/2019   Procedure: VIDEO BRONCHOSCOPY WITH FLUORO;  Surgeon: Marshell Garfinkel, MD;  Location: [facility];  Service: Cardiopulmonary;  Laterality: Bilateral;   VIDEO BRONCHOSCOPY WITH ENDOBRONCHIAL ULTRASOUND N/A 10/08/2020   Procedure: VIDEO BRONCHOSCOPY WITH ENDOBRONCHIAL ULTRASOUND;  Surgeon: Garner Nash, DO;  Location: [facility];  Service: Pulmonary;  Laterality: N/A;    REVIEW OF SYSTEMS:  A comprehensive review of systems was negative except for: Constitutional: positive for fatigue Ears, nose, mouth, throat, and face: positive for sore mouth and sore throat Respiratory: positive for cough Gastrointestinal: positive for odynophagia   PHYSICAL EXAMINATION: General appearance: alert, cooperative, fatigued, and no distress Head: Normocephalic, without obvious abnormality, atraumatic Neck: no adenopathy, no JVD, supple, symmetrical, trachea midline, and thyroid not enlarged, symmetric, no tenderness/mass/nodules Lymph nodes: Cervical, supraclavicular, and axillary nodes  normal. Resp: clear to auscultation bilaterally Back: symmetric, no curvature. ROM normal. No CVA tenderness. Cardio: regular rate and rhythm, S1, S2 normal, no murmur, click, rub or gallop GI: soft, non-tender; bowel sounds normal; no masses,  no organomegaly Extremities: extremities normal, atraumatic, no cyanosis or edema  ECOG PERFORMANCE STATUS: 1 - Symptomatic but completely ambulatory  Blood pressure (!) 135/91, pulse (!) 105, temperature 99.3 F (37.4 C), temperature source Tympanic, resp. rate 19, height 5\' 4"  (1.626 m), weight 128 lb 1.6 oz (58.1 kg), SpO2 97 %.  LABORATORY DATA: Lab Results  Component Value Date   WBC 1.6 (L) 12/14/2020   HGB 11.4 (L) 12/14/2020   HCT 32.8 (L) 12/14/2020   MCV 88.9 12/14/2020   PLT 98 (L) 12/14/2020      Chemistry      Component Value Date/Time   NA 141 12/08/2020 1110   NA 144 02/10/2020 0813   K 3.8 12/08/2020 1110   CL 106 12/08/2020 1110   CO2 26 12/08/2020 1110   BUN 11 12/08/2020 1110   BUN 10 02/10/2020 0813   CREATININE 0.73 12/08/2020 1110  CREATININE 0.84 11/11/2019 1411      Component Value Date/Time   CALCIUM 9.3 12/08/2020 1110   ALKPHOS 81 12/08/2020 1110   AST 29 12/08/2020 1110   ALT 41 12/08/2020 1110   BILITOT 0.4 12/08/2020 1110       RADIOGRAPHIC STUDIES: No results found.  ASSESSMENT AND PLAN: This is a very pleasant 65 years old white female recently diagnosed with a stage IIIA (TX, N2, M0) non-small cell lung cancer, squamous cell carcinoma presented with subcarinal mass/lymphadenopathy diagnosed in July 2022. The patient is currently undergoing a course of concurrent chemoradiation with weekly carboplatin for AUC of 2 and paclitaxel 45 Mg/M2 status post 6 cycles. She is tolerating her treatment well except for dry cough as well as sore throat as well as the odynophagia.   CBC today showed neutropenia and the patient is finishing the last fraction of radiotherapy tomorrow. Will skip cycle #7 of  the chemotherapy today. I will see the patient back for follow-up visit in 3 weeks for evaluation with repeat CT scan of the chest for restaging of her disease. For the radiation-induced esophagitis and sore throat, she is currently on Carafate. The patient was advised to call immediately if she has any concerning symptoms in the interval. The patient voices understanding of current disease status and treatment options and is in agreement with the current care plan.  All questions were answered. The patient knows to call the clinic with any problems, questions or concerns. We can certainly see the patient much sooner if necessary.  Disclaimer: This note was dictated with voice recognition software. Similar sounding words can inadvertently be transcribed and may not be corrected upon review.

## 2020-12-15 ENCOUNTER — Encounter: Payer: Self-pay | Admitting: Radiation Oncology

## 2020-12-15 ENCOUNTER — Ambulatory Visit
Admission: RE | Admit: 2020-12-15 | Discharge: 2020-12-15 | Disposition: A | Discharge status: Home / Self Care | Payer: Medicare Other | Source: Ambulatory Visit | Attending: Radiation Oncology | Admitting: Radiation Oncology

## 2020-12-15 ENCOUNTER — Other Ambulatory Visit: Payer: Self-pay | Admitting: Radiation Oncology

## 2020-12-15 ENCOUNTER — Other Ambulatory Visit: Payer: Self-pay

## 2020-12-15 DIAGNOSIS — Z5111 Encounter for antineoplastic chemotherapy: Secondary | ICD-10-CM | POA: Diagnosis not present

## 2020-12-15 MED ORDER — BENZONATATE 100 MG PO CAPS
200.0000 mg | ORAL_CAPSULE | Freq: Three times a day (TID) | ORAL | 1 refills | Status: DC | PRN
Start: 2020-12-15 — End: 2021-02-09

## 2020-12-21 ENCOUNTER — Encounter: Payer: Self-pay | Admitting: Internal Medicine

## 2020-12-24 ENCOUNTER — Ambulatory Visit (HOSPITAL_COMMUNITY): Payer: Medicare Other | Attending: Cardiology

## 2020-12-24 ENCOUNTER — Other Ambulatory Visit: Payer: Self-pay

## 2020-12-24 DIAGNOSIS — C3491 Malignant neoplasm of unspecified part of right bronchus or lung: Secondary | ICD-10-CM | POA: Diagnosis present

## 2020-12-24 DIAGNOSIS — I712 Thoracic aortic aneurysm, without rupture, unspecified: Secondary | ICD-10-CM

## 2020-12-25 LAB — ECHOCARDIOGRAM COMPLETE
Area-P 1/2: 4.4 cm2
S' Lateral: 2.8 cm

## 2020-12-29 ENCOUNTER — Telehealth: Payer: Self-pay | Admitting: *Deleted

## 2020-12-29 DIAGNOSIS — I2510 Atherosclerotic heart disease of native coronary artery without angina pectoris: Secondary | ICD-10-CM

## 2020-12-29 DIAGNOSIS — I712 Thoracic aortic aneurysm, without rupture, unspecified: Secondary | ICD-10-CM

## 2020-12-29 DIAGNOSIS — I77819 Aortic ectasia, unspecified site: Secondary | ICD-10-CM

## 2020-12-29 NOTE — Telephone Encounter (Signed)
-----   Message from Elouise Munroe, MD sent at 12/28/2020  7:23 PM EDT ----- Stable measurements on echo for aorta dilation. No other worrisome findings. Would repeat an echo in 1-2 years.

## 2020-12-29 NOTE — Telephone Encounter (Signed)
Pt made aware of echo results and recommendations per Dr. Margaretann Loveless, covering for Dr. Johney Frame this week. Informed the pt that she will need a repeat echo in one year, and I will go ahead and place the order in the system, and send a message to our Echo Scheduler to call her back and arrange this appt closer to that time. Pt verbalized understanding and agrees with this plan.

## 2020-12-31 ENCOUNTER — Other Ambulatory Visit: Payer: Self-pay

## 2020-12-31 ENCOUNTER — Encounter (HOSPITAL_COMMUNITY): Payer: Self-pay

## 2020-12-31 ENCOUNTER — Inpatient Hospital Stay: Payer: Medicare Other

## 2020-12-31 ENCOUNTER — Ambulatory Visit (HOSPITAL_COMMUNITY)
Admission: RE | Admit: 2020-12-31 | Discharge: 2020-12-31 | Disposition: A | Discharge status: Home / Self Care | Payer: Medicare Other | Source: Ambulatory Visit | Attending: Internal Medicine | Admitting: Internal Medicine

## 2020-12-31 DIAGNOSIS — C3490 Malignant neoplasm of unspecified part of unspecified bronchus or lung: Secondary | ICD-10-CM

## 2020-12-31 DIAGNOSIS — Z5111 Encounter for antineoplastic chemotherapy: Secondary | ICD-10-CM | POA: Diagnosis not present

## 2020-12-31 LAB — CBC WITH DIFFERENTIAL (CANCER CENTER ONLY)
Abs Immature Granulocytes: 0.01 10*3/uL (ref 0.00–0.07)
Basophils Absolute: 0 10*3/uL (ref 0.0–0.1)
Basophils Relative: 1 %
Eosinophils Absolute: 0 10*3/uL (ref 0.0–0.5)
Eosinophils Relative: 1 %
HCT: 32.2 % — ABNORMAL LOW (ref 36.0–46.0)
Hemoglobin: 10.9 g/dL — ABNORMAL LOW (ref 12.0–15.0)
Immature Granulocytes: 1 %
Lymphocytes Relative: 40 %
Lymphs Abs: 0.9 10*3/uL (ref 0.7–4.0)
MCH: 31.1 pg (ref 26.0–34.0)
MCHC: 33.9 g/dL (ref 30.0–36.0)
MCV: 91.7 fL (ref 80.0–100.0)
Monocytes Absolute: 0.4 10*3/uL (ref 0.1–1.0)
Monocytes Relative: 18 %
Neutro Abs: 0.9 10*3/uL — ABNORMAL LOW (ref 1.7–7.7)
Neutrophils Relative %: 39 %
Platelet Count: 136 10*3/uL — ABNORMAL LOW (ref 150–400)
RBC: 3.51 MIL/uL — ABNORMAL LOW (ref 3.87–5.11)
RDW: 17.4 % — ABNORMAL HIGH (ref 11.5–15.5)
WBC Count: 2.2 10*3/uL — ABNORMAL LOW (ref 4.0–10.5)
nRBC: 0 % (ref 0.0–0.2)

## 2020-12-31 LAB — CMP (CANCER CENTER ONLY)
ALT: 33 U/L (ref 0–44)
AST: 28 U/L (ref 15–41)
Albumin: 3.8 g/dL (ref 3.5–5.0)
Alkaline Phosphatase: 103 U/L (ref 38–126)
Anion gap: 9 (ref 5–15)
BUN: 16 mg/dL (ref 8–23)
CO2: 25 mmol/L (ref 22–32)
Calcium: 9.1 mg/dL (ref 8.9–10.3)
Chloride: 108 mmol/L (ref 98–111)
Creatinine: 0.77 mg/dL (ref 0.44–1.00)
GFR, Estimated: >60 mL/min (ref >60)
Glucose, Bld: 93 mg/dL (ref 70–99)
Potassium: 4.3 mmol/L (ref 3.5–5.1)
Sodium: 142 mmol/L (ref 135–145)
Total Bilirubin: 0.2 mg/dL — ABNORMAL LOW (ref 0.3–1.2)
Total Protein: 6.6 g/dL (ref 6.5–8.1)

## 2020-12-31 MED ORDER — IOHEXOL 350 MG/ML SOLN
50.0000 mL | Freq: Once | INTRAVENOUS | Status: AC | PRN
  Administered 2020-12-31: 50 mL via INTRAVENOUS

## 2021-01-01 ENCOUNTER — Telehealth: Payer: Self-pay | Admitting: Pulmonary Disease

## 2021-01-01 ENCOUNTER — Ambulatory Visit (HOSPITAL_COMMUNITY): Payer: Medicare Other

## 2021-01-01 NOTE — Telephone Encounter (Signed)
Call returned to patient, confirmed DOB. She states her insurance company reached out to [place] and we would not give them the procedure code for the [place]. I apologized and made her aware  we would help her anyway we can however this is not something that we typically have access to however I could send a message to find out. She states on her billing sheets the procedure code is listed and she wanted to know if it was accurate. I made her aware if they billed her for then more than likely it is correct. She is going to called the insurance and give them the procedure code for the bronch from there.   I made her aware to call back if she needs anything else.    Nothing further needed at this time.

## 2021-01-04 ENCOUNTER — Ambulatory Visit: Payer: Medicare Other | Admitting: Internal Medicine

## 2021-01-04 ENCOUNTER — Telehealth: Payer: Self-pay | Admitting: Internal Medicine

## 2021-01-04 NOTE — Telephone Encounter (Signed)
Sch per 10/3, pt request

## 2021-01-06 ENCOUNTER — Telehealth: Payer: Self-pay | Admitting: Medical Oncology

## 2021-01-06 NOTE — Telephone Encounter (Addendum)
CT results- I told pt to keep appt tomorrow and to go to ED if breathing worsens.  Katrina Campbell notified and concurred.

## 2021-01-06 NOTE — Telephone Encounter (Signed)
Appt tomorrow.   CT results-Pt concerned because of finding of lung collapse. She said her breathing is short and she is coughing a lot . She loses her breath after coughing. Can she see Katrina Campbell today or what does  he advise

## 2021-01-07 ENCOUNTER — Encounter: Payer: Self-pay | Admitting: Internal Medicine

## 2021-01-07 ENCOUNTER — Other Ambulatory Visit: Payer: Self-pay | Admitting: Internal Medicine

## 2021-01-07 ENCOUNTER — Inpatient Hospital Stay: Payer: Medicare Other | Attending: Physician Assistant | Admitting: Internal Medicine

## 2021-01-07 ENCOUNTER — Telehealth: Payer: Self-pay | Admitting: Pulmonary Disease

## 2021-01-07 ENCOUNTER — Other Ambulatory Visit: Payer: Self-pay

## 2021-01-07 VITALS — BP 139/95 | HR 97 | Temp 98.4°F | Resp 20 | Ht 64.0 in | Wt 129.9 lb

## 2021-01-07 DIAGNOSIS — C3491 Malignant neoplasm of unspecified part of right bronchus or lung: Secondary | ICD-10-CM

## 2021-01-07 DIAGNOSIS — Z5112 Encounter for antineoplastic immunotherapy: Secondary | ICD-10-CM | POA: Insufficient documentation

## 2021-01-07 DIAGNOSIS — Z79899 Other long term (current) drug therapy: Secondary | ICD-10-CM | POA: Insufficient documentation

## 2021-01-07 DIAGNOSIS — R918 Other nonspecific abnormal finding of lung field: Secondary | ICD-10-CM

## 2021-01-07 DIAGNOSIS — J9819 Other pulmonary collapse: Secondary | ICD-10-CM | POA: Insufficient documentation

## 2021-01-07 DIAGNOSIS — R5382 Chronic fatigue, unspecified: Secondary | ICD-10-CM | POA: Diagnosis not present

## 2021-01-07 MED ORDER — HYDROCODONE BIT-HOMATROP MBR 5-1.5 MG/5ML PO SOLN: 5.0000 mL | Freq: Four times a day (QID) | ORAL | 0 refills | Status: DC | PRN

## 2021-01-07 NOTE — Telephone Encounter (Signed)
PCCM:  Contacted by Dr. Lew Dawes office.  Recent CT scan shows right middle lobe collapse.  Known history of malignancy.  Requesting repeat bronchoscopy to evaluate airway for concern of potential endobronchial lesion.  Orders placed for video bronchoscopy and possible cryotherapy.  Garner Nash, DO [facility] 01/07/2021 10:40 AM

## 2021-01-07 NOTE — Progress Notes (Signed)
[place] [phone]   [phone]  OFFICE PROGRESS NOTE  Carlena Hurl, PA-C [address] [place] [address]  DIAGNOSIS: Stage IIIA (TX, N2, M0) non-small cell lung cancer, squamous cell carcinoma presented with subcarinal mass/lymphadenopathy diagnosed in July 2022.   PRIOR THERAPY: Weekly carboplatin for AUC of 2 and paclitaxel 45 mg/m2.  First dose expected on 11/02/2020.  Status post 7 cycles.  Last dose was given 12/14/2020   CURRENT THERAPY: Consolidation treatment with immunotherapy with Imfinzi 1500 Mg IV every 4 weeks.  First dose 01/14/2021  INTERVAL HISTORY: [name] 65 y.o. female returns to the clinic today for follow-up visit.  The patient is feeling fine today with no concerning complaints except for dry cough and shortness of breath with exertion and also at nighttime when she sleeps.  She denied having any chest pain or hemoptysis.  She denied having any fever or chills.  She has no nausea, vomiting, diarrhea or constipation.  She has no headache or visual changes.  She tolerated the previous course of concurrent chemoradiation fairly well.  She had repeat CT scan of the chest performed recently and she is here for evaluation and discussion of her scan results.  MEDICAL HISTORY: Past Medical History:  Diagnosis Date   Anxiety    COPD (chronic obstructive pulmonary disease) (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  MEDICATIONS:  Current Outpatient Medications  Medication Sig Dispense Refill   albuterol (PROAIR HFA) 108 (90 Base) MCG/ACT inhaler Inhale 2 puffs into the lungs every 6 (six) hours as needed for wheezing or shortness of breath. 24 g 0   albuterol  (PROVENTIL) (2.5 MG/3ML) 0.083% nebulizer solution Take 3 mLs (2.5 mg total) by nebulization every 6 (six) hours as needed for wheezing or shortness of breath. 540 mL 3   aspirin EC 81 MG tablet Take 81 mg by mouth daily with lunch.      benzonatate (TESSALON) 100 MG capsule Take 2 capsules (200 mg total) by mouth 3 (three) times daily as needed for cough. 30 capsule 1   buPROPion (WELLBUTRIN XL) 300 MG 24 hr tablet Take 1 tablet (300 mg total) by mouth daily. 90 tablet 3   Coenzyme Q10 (COQ-10) 100 MG CAPS Take 100 mg by mouth daily.     esomeprazole (NEXIUM) 20 MG capsule Take 1 capsule (20 mg total) by mouth daily at 12 noon. 1 hour prior to meal 30 capsule 1   fluticasone (FLONASE) 50 MCG/ACT nasal spray Place 1-2 sprays into both nostrils as needed for allergies or rhinitis.     Guaifenesin (MUCINEX MAXIMUM STRENGTH) 1200 MG TB12 Take 1,200 mg by mouth daily.     Magnesium 500 MG TABS Take 500 mg by mouth daily with lunch.     Multiple Vitamins-Minerals (IMMUNE SUPPORT PO) Take 2 tablets by mouth daily.     prochlorperazine (COMPAZINE) 10 MG tablet Take 1 tablet (10 mg total) by mouth every 6 (six) hours as needed for nausea or vomiting. 30 tablet 0   Respiratory Therapy Supplies (FLUTTER) DEVI Use as directed 1 each 0   rosuvastatin (CRESTOR) 20 MG tablet Take 1 tablet (20 mg total) by mouth daily.  90 tablet 3   sucralfate (CARAFATE) 1 g tablet Take 1 tablet (1 g total) by mouth 4 (four) times daily -  with meals and at bedtime. Crush and dissolve in 10 mL of warm water prior to swallowing 120 tablet 1   temazepam (RESTORIL) 15 MG capsule TAKE 1 CAPSULE AT BEDTIME AS NEEDED FOR SLEEP (NEED OFFICE VISIT) 90 capsule 0   umeclidinium-vilanterol (ANORO ELLIPTA) 62.5-25 MCG/INH AEPB Inhale 1 puff into the lungs daily. 180 each 3   vitamin B-12 (CYANOCOBALAMIN) 1000 MCG tablet Take 1,000 mcg by mouth daily.     No current facility-administered medications for this visit.   Facility-Administered  Medications Ordered in Other Visits  Medication Dose Route Frequency Provider Last Rate Last Admin   Sonafine emulsion 1 application  1 application Topical Once Gery Pray, MD        SURGICAL HISTORY:  Past Surgical History:  Procedure Laterality Date   BRONCHIAL BIOPSY  10/08/2020   Procedure: BRONCHIAL BIOPSIES;  Surgeon: Garner Nash, DO;  Location: [facility];  Service: Pulmonary;;   BRONCHIAL BRUSHINGS  10/08/2020   Procedure: BRONCHIAL BRUSHINGS;  Surgeon: Garner Nash, DO;  Location: [facility];  Service: Pulmonary;;   BRONCHIAL WASHINGS  10/08/2020   Procedure: BRONCHIAL WASHINGS;  Surgeon: Garner Nash, DO;  Location: [facility];  Service: Pulmonary;;   FINE NEEDLE ASPIRATION  10/08/2020   Procedure: FINE NEEDLE ASPIRATION (FNA) LINEAR;  Surgeon: Garner Nash, DO;  Location: [facility];  Service: Pulmonary;;   LEFT HEART CATH AND CORONARY ANGIOGRAPHY N/A 11/07/2017   Procedure: LEFT HEART CATH AND CORONARY ANGIOGRAPHY;  Surgeon: Nelva Bush, MD;  Location: [facility];  Service: Cardiovascular;  Laterality: N/A;   NASAL SINUS SURGERY  1986   VIDEO BRONCHOSCOPY Bilateral 01/29/2019   Procedure: VIDEO BRONCHOSCOPY WITH FLUORO;  Surgeon: Marshell Garfinkel, MD;  Location: [facility];  Service: Cardiopulmonary;  Laterality: Bilateral;   VIDEO BRONCHOSCOPY WITH ENDOBRONCHIAL ULTRASOUND N/A 10/08/2020   Procedure: VIDEO BRONCHOSCOPY WITH ENDOBRONCHIAL ULTRASOUND;  Surgeon: Garner Nash, DO;  Location: [facility];  Service: Pulmonary;  Laterality: N/A;    REVIEW OF SYSTEMS:  Constitutional: positive for fatigue Eyes: negative Ears, nose, mouth, throat, and face: negative Respiratory: positive for cough and dyspnea on exertion Cardiovascular: negative Gastrointestinal: negative Genitourinary:negative Integument/breast: negative Hematologic/lymphatic: negative Musculoskeletal:negative Neurological: negative Behavioral/Psych: negative Endocrine:  negative Allergic/Immunologic: negative   PHYSICAL EXAMINATION: General appearance: alert, cooperative, fatigued, and no distress Head: Normocephalic, without obvious abnormality, atraumatic Neck: no adenopathy, no JVD, supple, symmetrical, trachea midline, and thyroid not enlarged, symmetric, no tenderness/mass/nodules Lymph nodes: Cervical, supraclavicular, and axillary nodes normal. Resp: clear to auscultation bilaterally Back: symmetric, no curvature. ROM normal. No CVA tenderness. Cardio: regular rate and rhythm, S1, S2 normal, no murmur, click, rub or gallop GI: soft, non-tender; bowel sounds normal; no masses,  no organomegaly Extremities: extremities normal, atraumatic, no cyanosis or edema Neurologic: Alert and oriented X 3, normal strength and tone. Normal symmetric reflexes. Normal coordination and gait  ECOG PERFORMANCE STATUS: 1 - Symptomatic but completely ambulatory  Blood pressure (!) 139/95, pulse 97, temperature 98.4 F (36.9 C), temperature source Oral, resp. rate 20, height 5\' 4"  (1.626 m), weight 129 lb 14.4 oz (58.9 kg), SpO2 97 %.  LABORATORY DATA: Lab Results  Component Value Date   WBC 2.2 (L) 12/31/2020   HGB 10.9 (L) 12/31/2020   HCT 32.2 (L) 12/31/2020   MCV 91.7 12/31/2020   PLT 136 (L) 12/31/2020  Chemistry      Component Value Date/Time   NA 142 12/31/2020 1443   NA 144 02/10/2020 0813   K 4.3 12/31/2020 1443   CL 108 12/31/2020 1443   CO2 25 12/31/2020 1443   BUN 16 12/31/2020 1443   BUN 10 02/10/2020 0813   CREATININE 0.77 12/31/2020 1443   CREATININE 0.84 11/11/2019 1411      Component Value Date/Time   CALCIUM 9.1 12/31/2020 1443   ALKPHOS 103 12/31/2020 1443   AST 28 12/31/2020 1443   ALT 33 12/31/2020 1443   BILITOT 0.2 (L) 12/31/2020 1443       RADIOGRAPHIC STUDIES: CT Chest W Contrast  Addendum Date: 01/02/2021   ADDENDUM REPORT: 01/02/2021 12:14 ADDENDUM: As noted in the body of the report, ascending thoracic aorta  measures 4.0 cm diameter. Continued attention on surveillance CT recommended. Electronically Signed   By: Misty Stanley M.D.   On: 01/02/2021 12:14   Result Date: 01/02/2021 CLINICAL DATA:  Non-small-cell lung cancer.  Restaging. EXAM: CT CHEST WITH CONTRAST TECHNIQUE: Multidetector CT imaging of the chest was performed during intravenous contrast administration. CONTRAST:  2mL OMNIPAQUE IOHEXOL 350 MG/ML SOLN COMPARISON:  PET-CT 09/29/2020.  Chest CT 09/21/2020. FINDINGS: Cardiovascular: The heart size is normal. No substantial pericardial effusion. Coronary artery calcification is evident. Moderate atherosclerotic calcification is noted in the wall of the thoracic aorta. Ascending thoracic aorta measures 4 cm diameter. Mediastinum/Nodes: Subcarinal node measured on 09/21/2020 at 1.8 cm short axis is now 0.9 cm short axis on 67/2. No other mediastinal lymphadenopathy. There is no hilar lymphadenopathy. The esophagus has normal imaging features. There is no axillary lymphadenopathy. Lungs/Pleura: Centrilobular and paraseptal emphysema evident. Interval development of complete right middle lobe collapse. Marked bronchial wall thickening noted in the central airways of the right upper lobe with persistent impacted airway on image 60/7. No new suspicious pulmonary nodule or mass. No pleural effusion. Upper Abdomen: Tiny hypodensity in the posterior hepatic dome (126/2) is stable. Musculoskeletal: No worrisome lytic or sclerotic osseous abnormality. IMPRESSION: 1. Interval development of complete right middle lobe collapse with persistent stable impacted airway in the right upper lobe. 2. Interval decrease in size of the subcarinal lymphadenopathy seen previously. 3. Aortic Atherosclerosis (ICD10-I70.0) and Emphysema (ICD10-J43.9). Electronically Signed: By: Misty Stanley M.D. On: 01/02/2021 11:07   ECHOCARDIOGRAM COMPLETE  Result Date: 12/25/2020    ECHOCARDIOGRAM REPORT   Patient Name:   Katrina [place]   Date of  Exam: 12/24/2020 Medical Rec #:  [id number]     Height:       64.0 in Accession #:    [id number]    Weight:       128.1 lb Date of Birth:  11/04/1955      BSA:          1.619 m Patient Age:    89 years      BP:           120/76 mmHg Patient Gender: F             HR:           92 bpm. Exam Location:  [facility] Procedure: 2D Echo, 3D Echo, Cardiac Doppler, Color Doppler and Strain Analysis Indications:    I71.2 Thoracic Aortic Aneurysm  History:        Patient has prior history of Echocardiogram examinations, most                 recent 03/11/2020. CAD and Previous Myocardial Infarction, COPD;  Risk Factors:Hypertension, Dyslipidemia, Family History of                 Coronary Artery Disease and Former Smoker. 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  Sonographer:    Deliah Boston RDCS Referring Phys: Greer Ee [place] IMPRESSIONS  1. Left ventricular ejection fraction, by estimation, is 55 to 60%. Left ventricular ejection fraction by 3D volume is 62 %. The left ventricle has normal function. The left ventricle has no regional wall motion abnormalities. Left ventricular diastolic  parameters are indeterminate. The average left ventricular global longitudinal strain is -17.7 %. The global longitudinal strain is normal.  2. Right ventricular systolic function is normal. The right ventricular size is normal. Tricuspid regurgitation signal is inadequate for assessing PA pressure.  3. Left atrial size was mildly dilated.  4. The mitral valve is normal in structure. Trivial mitral valve regurgitation. No evidence of mitral stenosis.  5. The aortic valve is tricuspid. Aortic valve regurgitation is trivial. No aortic stenosis is present.  6. Aortic dilatation noted. There is mild dilatation of the ascending aorta, measuring 40 mm.  7. The inferior vena cava is normal in size with greater than 50% respiratory variability, suggesting right atrial pressure of 3 mmHg.  Comparison(s): No significant change from prior study. Conclusion(s)/Recommendation(s): Stable TAA (prior study 41 mm, current study 40 mm). FINDINGS  Left Ventricle: Left ventricular ejection fraction, by estimation, is 55 to 60%. Left ventricular ejection fraction by 3D volume is 62 %. The left ventricle has normal function. The left ventricle has no regional wall motion abnormalities. The average left ventricular global longitudinal strain is -17.7 %. The global longitudinal strain is normal. The left ventricular internal cavity size was normal in size. There is no left ventricular hypertrophy. Left ventricular diastolic parameters are indeterminate. Right Ventricle: The right ventricular size is normal. No increase in right ventricular wall thickness. Right ventricular systolic function is normal. Tricuspid regurgitation signal is inadequate for assessing PA pressure. Left Atrium: Left atrial size was mildly dilated. Right Atrium: Right atrial size was normal in size. Pericardium: There is no evidence of pericardial effusion. Presence of pericardial fat pad. Mitral Valve: The mitral valve is normal in structure. Trivial mitral valve regurgitation. No evidence of mitral valve stenosis. Tricuspid Valve: The tricuspid valve is normal in structure. Tricuspid valve regurgitation is not demonstrated. No evidence of tricuspid stenosis. Aortic Valve: The aortic valve is tricuspid. Aortic valve regurgitation is trivial. No aortic stenosis is present. Pulmonic Valve: The pulmonic valve was grossly normal. Pulmonic valve regurgitation is trivial. No evidence of pulmonic stenosis. Aorta: Aortic dilatation noted. There is mild dilatation of the ascending aorta, measuring 40 mm. Venous: The inferior vena cava is normal in size with greater than 50% respiratory variability, suggesting right atrial pressure of 3 mmHg. IAS/Shunts: The atrial septum is grossly normal.  LEFT VENTRICLE PLAX 2D LVIDd:         4.20 cm          Diastology LVIDs:         2.80 cm         LV e' medial:    6.36 cm/s LV PW:         0.60 cm         LV E/e' medial:  16.1 LV IVS:        0.70 cm  LV e' lateral:   9.32 cm/s LVOT diam:     2.30 cm         LV E/e' lateral: 11.0 LV SV:         59 LV SV Index:   36              2D LVOT Area:     4.15 cm        Longitudinal                                Strain                                2D Strain GLS  -13.6 %                                (A2C):                                2D Strain GLS  -17.2 %                                (A3C):                                2D Strain GLS  -22.3 %                                (A4C):                                2D Strain GLS  -17.7 %                                Avg:                                 3D Volume EF                                LV 3D EF:    Left                                             ventricul                                             ar                                             ejection  fraction                                             by 3D                                             volume is                                             62 %.                                 3D Volume EF:                                3D EF:        62 %                                LV EDV:       104 ml                                LV ESV:       39 ml                                LV SV:        65 ml RIGHT VENTRICLE RV S prime:     12.90 cm/s TAPSE (M-mode): 1.8 cm LEFT ATRIUM           Index       RIGHT ATRIUM           Index LA diam:      2.80 cm 1.73 cm/m  RA Area:     11.70 cm LA Vol (A2C): 43.6 ml 26.93 ml/m RA Volume:   25.00 ml  15.44 ml/m LA Vol (A4C): 30.3 ml 18.72 ml/m  AORTIC VALVE LVOT Vmax:   85.65 cm/s LVOT Vmean:  57.650 cm/s LVOT VTI:    0.142 m  AORTA Ao Root diam: 3.50 cm Ao Asc diam:  3.95 cm MITRAL VALVE MV Area (PHT)  cm          SHUNTS MV Decel Time: 173 msec     Systemic  VTI:  0.14 m MV E velocity: 102.55 cm/s  Systemic Diam: 2.30 cm MV A velocity: 113.00 cm/s MV E/A ratio:  0.91 Buford Dresser MD Electronically signed by Buford Dresser MD Signature Date/Time: 12/25/2020/11:29:19 PM    Final     ASSESSMENT AND PLAN: This is a very pleasant 65 years old white female recently diagnosed with a stage IIIA (TX, N2, M0) non-small cell lung cancer, squamous cell carcinoma presented with subcarinal mass/lymphadenopathy diagnosed in July 2022. The patient underwent a course of concurrent chemoradiation with weekly carboplatin for AUC of 2 and paclitaxel 45 Mg/M2 status post 7 cycles. The patient tolerated  her treatment well except for fatigue and sore throat. She had repeat CT scan of the chest performed recently.  I personally and independently reviewed the scan images and discussed the result and showed the images to the patient today. Her scan showed improvement of the subcarinal lymph node that was the presentation of her disease.  It also showed collapse of the right middle lobe that could be secondary to scarring from radiation, mucous plugging or central lung mass. I recommended for the patient to proceed with the consolidation treatment with immunotherapy with Imfinzi 1500 Mg IV every 4 weeks.  I discussed with her the adverse effect of this treatment including but not limited to immunotherapy mediated skin rash, diarrhea, inflammation of the lung, kidney, liver, thyroid or other endocrine dysfunction. She is interested in proceeding with the treatment and expected to start the first cycle of this treatment next week. For the right middle lobe collapse, I will refer the patient back to Dr. Valeta Harms for consideration of repeat bronchoscopy and evaluation for any central obstructing lesion or mucous plugging. The patient will come back for follow-up visit in 5 weeks for evaluation with the start of cycle #2. She was advised to call immediately if she has any  concerning symptoms in the interval. The patient voices understanding of current disease status and treatment options and is in agreement with the current care plan.  All questions were answered. The patient knows to call the clinic with any problems, questions or concerns. We can certainly see the patient much sooner if necessary.  Disclaimer: This note was dictated with voice recognition software. Similar sounding words can inadvertently be transcribed and may not be corrected upon review.

## 2021-01-07 NOTE — Progress Notes (Signed)
DISCONTINUE ON PATHWAY REGIMEN - Non-Small Cell Lung     Administer weekly:     Paclitaxel      Carboplatin   **Always confirm dose/schedule in your pharmacy ordering system**  REASON: Continuation Of Treatment PRIOR TREATMENT: [id number]: Carboplatin AUC=2 + Paclitaxel 45 mg/m2 Weekly During Radiation TREATMENT RESPONSE: Partial Response (PR)  START ON PATHWAY REGIMEN - Non-Small Cell Lung     A cycle is every 28 days:     Durvalumab   **Always confirm dose/schedule in your pharmacy ordering system**  Patient Characteristics: Preoperative or Nonsurgical Candidate (Clinical Staging), Stage III - Nonsurgical Candidate (Nonsquamous and Squamous), PS = 0, 1 Therapeutic Status: Preoperative or Nonsurgical Candidate (Clinical Staging) AJCC T Category: cTX AJCC N Category: cNX AJCC M Category: cM0 AJCC 8 Stage Grouping: IIIA ECOG Performance Status: 1 Intent of Therapy: Curative Intent, Discussed with Patient

## 2021-01-07 NOTE — Telephone Encounter (Signed)
I have scheduled pt for video bronch on 10/21 at 7:30 at [facility].  She will go for covid test on 10/18.  I have given appt info to pt.

## 2021-01-08 ENCOUNTER — Telehealth: Payer: Self-pay | Admitting: Internal Medicine

## 2021-01-08 NOTE — Telephone Encounter (Signed)
Scheduled follow-up appointments per 10/6 los. Patient is aware.

## 2021-01-08 NOTE — Telephone Encounter (Signed)
Looking at the patients appointment desk she has several things scheduled for 01/14/21. She has an infusion scheduled as well as blood work. Do you still want [place] to call her to see if she is willing to reschedule those other appts?

## 2021-01-08 NOTE — Telephone Encounter (Signed)
I have spoke to [facility] and pt and she has been rescheduled to 10/13 at 7:30.  She is going for covid test on 10/10.

## 2021-01-11 ENCOUNTER — Other Ambulatory Visit: Payer: Self-pay | Admitting: Pulmonary Disease

## 2021-01-11 ENCOUNTER — Ambulatory Visit: Payer: Medicare Other | Admitting: Cardiovascular Disease

## 2021-01-11 ENCOUNTER — Encounter: Payer: Self-pay | Admitting: Radiation Oncology

## 2021-01-11 LAB — SARS CORONAVIRUS 2 (TAT 6-24 HRS): SARS Coronavirus 2: NEGATIVE

## 2021-01-11 NOTE — Progress Notes (Signed)
Pharmacist Chemotherapy Monitoring - Initial Assessment    Anticipated start date: 01/12/21   The following has been reviewed per standard work regarding the patient's treatment regimen: The patient's diagnosis, treatment plan and drug doses, and organ/hematologic function Lab orders and baseline tests specific to treatment regimen  The treatment plan start date, drug sequencing, and pre-medications Prior authorization status  Patient's documented medication list, including drug-drug interaction screen and prescriptions for anti-emetics and supportive care specific to the treatment regimen The drug concentrations, fluid compatibility, administration routes, and timing of the medications to be used The patient's access for treatment and lifetime cumulative dose history, if applicable  The patient's medication allergies and previous infusion related reactions, if applicable   Changes made to treatment plan:  N/A  Follow up needed:  N/A    Kennith Center, Pharm.D., CPP 01/11/2021@2 :44 PM

## 2021-01-12 ENCOUNTER — Inpatient Hospital Stay: Payer: Medicare Other

## 2021-01-12 ENCOUNTER — Other Ambulatory Visit: Payer: Self-pay

## 2021-01-12 VITALS — BP 154/83 | HR 85 | Temp 98.5°F | Resp 20 | Wt 129.0 lb

## 2021-01-12 DIAGNOSIS — C3491 Malignant neoplasm of unspecified part of right bronchus or lung: Secondary | ICD-10-CM

## 2021-01-12 DIAGNOSIS — R5382 Chronic fatigue, unspecified: Secondary | ICD-10-CM

## 2021-01-12 DIAGNOSIS — Z5112 Encounter for antineoplastic immunotherapy: Secondary | ICD-10-CM | POA: Diagnosis not present

## 2021-01-12 LAB — CBC WITH DIFFERENTIAL (CANCER CENTER ONLY)
Abs Immature Granulocytes: 0.01 10*3/uL (ref 0.00–0.07)
Basophils Absolute: 0 10*3/uL (ref 0.0–0.1)
Basophils Relative: 0 %
Eosinophils Absolute: 0.1 10*3/uL (ref 0.0–0.5)
Eosinophils Relative: 1 %
HCT: 36.3 % (ref 36.0–46.0)
Hemoglobin: 12.3 g/dL (ref 12.0–15.0)
Immature Granulocytes: 0 %
Lymphocytes Relative: 28 %
Lymphs Abs: 1.1 10*3/uL (ref 0.7–4.0)
MCH: 30.8 pg (ref 26.0–34.0)
MCHC: 33.9 g/dL (ref 30.0–36.0)
MCV: 90.8 fL (ref 80.0–100.0)
Monocytes Absolute: 0.4 10*3/uL (ref 0.1–1.0)
Monocytes Relative: 11 %
Neutro Abs: 2.3 10*3/uL (ref 1.7–7.7)
Neutrophils Relative %: 60 %
Platelet Count: 141 10*3/uL — ABNORMAL LOW (ref 150–400)
RBC: 4 MIL/uL (ref 3.87–5.11)
RDW: 15.4 % (ref 11.5–15.5)
WBC Count: 3.9 10*3/uL — ABNORMAL LOW (ref 4.0–10.5)
nRBC: 0 % (ref 0.0–0.2)

## 2021-01-12 LAB — CMP (CANCER CENTER ONLY)
ALT: 27 U/L (ref 0–44)
AST: 26 U/L (ref 15–41)
Albumin: 3.9 g/dL (ref 3.5–5.0)
Alkaline Phosphatase: 97 U/L (ref 38–126)
Anion gap: 10 (ref 5–15)
BUN: 16 mg/dL (ref 8–23)
CO2: 23 mmol/L (ref 22–32)
Calcium: 9.2 mg/dL (ref 8.9–10.3)
Chloride: 108 mmol/L (ref 98–111)
Creatinine: 0.76 mg/dL (ref 0.44–1.00)
GFR, Estimated: >60 mL/min (ref >60)
Glucose, Bld: 100 mg/dL — ABNORMAL HIGH (ref 70–99)
Potassium: 4 mmol/L (ref 3.5–5.1)
Sodium: 141 mmol/L (ref 135–145)
Total Bilirubin: 0.5 mg/dL (ref 0.3–1.2)
Total Protein: 6.9 g/dL (ref 6.5–8.1)

## 2021-01-12 LAB — TSH: TSH: 3.017 u[IU]/mL (ref 0.308–3.960)

## 2021-01-12 MED ORDER — SODIUM CHLORIDE 0.9 % IV SOLN
1500.0000 mg | Freq: Once | INTRAVENOUS | Status: AC
  Administered 2021-01-12: 1500 mg via INTRAVENOUS
  Filled 2021-01-12: qty 30

## 2021-01-12 MED ORDER — SODIUM CHLORIDE 0.9 % IV SOLN: Freq: Once | INTRAVENOUS | Status: AC

## 2021-01-12 NOTE — Patient Instructions (Signed)
[facility]   Discharge Instructions: Thank you for choosing [facility] to provide your oncology and hematology care.   If you have a lab appointment with the [facility], please go directly to the [facility] and check in at the registration area.   Wear comfortable clothing and clothing appropriate for easy access to any Portacath or PICC line.   We strive to give you quality time with your provider. You may need to reschedule your appointment if you arrive late (15 or more minutes).  Arriving late affects you and other patients whose appointments are after yours.  Also, if you miss three or more appointments without notifying the office, you may be dismissed from the clinic at the provider's discretion.      For prescription refill requests, have your pharmacy contact our office and allow 72 hours for refills to be completed.    Today you received the following chemotherapy and/or immunotherapy agents: durvalumab.      To help prevent nausea and vomiting after your treatment, we encourage you to take your nausea medication as directed.  BELOW ARE SYMPTOMS THAT SHOULD BE REPORTED IMMEDIATELY: *FEVER GREATER THAN 100.4 F (38 C) OR HIGHER *CHILLS OR SWEATING *NAUSEA AND VOMITING THAT IS NOT CONTROLLED WITH YOUR NAUSEA MEDICATION *UNUSUAL SHORTNESS OF BREATH *UNUSUAL BRUISING OR BLEEDING *URINARY PROBLEMS (pain or burning when urinating, or frequent urination) *BOWEL PROBLEMS (unusual diarrhea, constipation, pain near the anus) TENDERNESS IN MOUTH AND THROAT WITH OR WITHOUT PRESENCE OF ULCERS (sore throat, sores in mouth, or a toothache) UNUSUAL RASH, SWELLING OR PAIN  UNUSUAL VAGINAL DISCHARGE OR ITCHING   Items with * indicate a potential emergency and should be followed up as soon as possible or go to the Emergency Department if any problems should occur.  Please show the CHEMOTHERAPY ALERT CARD or IMMUNOTHERAPY ALERT CARD at check-in  to the Emergency Department and triage nurse.  Should you have questions after your visit or need to cancel or reschedule your appointment, please contact [facility]  Dept: [phone]  and follow the prompts.  Office hours are 8:00 a.m. to 4:30 p.m. Monday - Friday. Please note that voicemails left after 4:00 p.m. may not be returned until the following business day.  We are closed weekends and major holidays. You have access to a nurse at all times for urgent questions. Please call the main number to the clinic Dept: [phone] and follow the prompts.   For any non-urgent questions, you may also contact your provider using MyChart. We now offer e-Visits for anyone 67 and older to request care online for non-urgent symptoms. For details visit [email or web address].   Also download the MyChart app! Go to the app store, search "MyChart", open the app, select [facility], and log in with your MyChart username and password.  Due to Covid, a mask is required upon entering the hospital/clinic. If you do not have a mask, one will be given to you upon arrival. For doctor visits, patients may have 1 support person aged 19 or older with them. For treatment visits, patients cannot have anyone with them due to current Covid guidelines and our immunocompromised population.   Durvalumab injection What is this medication? DURVALUMAB (dur VAL ue mab) is a monoclonal antibody. It is used to treat lung cancer. This medicine may be used for other purposes; ask your health care provider or pharmacist if you have questions. COMMON BRAND NAME(S): [email or web address] What should I  tell my care team before I take this medication? They need to know if you have any of these conditions: autoimmune diseases like Crohn's disease, ulcerative colitis, or lupus have had or planning to have an allogeneic stem cell transplant (uses someone else's stem cells) history of organ transplant history of  radiation to the chest nervous system problems like myasthenia gravis or Guillain-Barre syndrome an unusual or allergic reaction to durvalumab, other medicines, foods, dyes, or preservatives pregnant or trying to get pregnant breast-feeding How should I use this medication? This medicine is for infusion into a vein. It is given by a health care professional in a hospital or clinic setting. A special MedGuide will be given to you before each treatment. Be sure to read this information carefully each time. Talk to your pediatrician regarding the use of this medicine in children. Special care may be needed. Overdosage: If you think you have taken too much of this medicine contact a poison control center or emergency room at once. NOTE: This medicine is only for you. Do not share this medicine with others. What if I miss a dose? It is important not to miss your dose. Call your doctor or health care professional if you are unable to keep an appointment. What may interact with this medication? Interactions have not been studied. This list may not describe all possible interactions. Give your health care provider a list of all the medicines, herbs, non-prescription drugs, or dietary supplements you use. Also tell them if you smoke, drink alcohol, or use illegal drugs. Some items may interact with your medicine. What should I watch for while using this medication? This drug may make you feel generally unwell. Continue your course of treatment even though you feel ill unless your doctor tells you to stop. You may need blood work done while you are taking this medicine. Do not become pregnant while taking this medicine or for 3 months after stopping it. Women should inform their doctor if they wish to become pregnant or think they might be pregnant. There is a potential for serious side effects to an unborn child. Talk to your health care professional or pharmacist for more information. Do not breast-feed  an infant while taking this medicine or for 3 months after stopping it. What side effects may I notice from receiving this medication? Side effects that you should report to your doctor or health care professional as soon as possible: allergic reactions like skin rash, itching or hives, swelling of the face, lips, or tongue black, tarry stools bloody or watery diarrhea breathing problems change in emotions or moods change in sex drive changes in vision chest pain or chest tightness chills confusion cough facial flushing fever headache signs and symptoms of high blood sugar such as dizziness; dry mouth; dry skin; fruity breath; nausea; stomach pain; increased hunger or thirst; increased urination signs and symptoms of liver injury like dark yellow or brown urine; general ill feeling or flu-like symptoms; light-colored stools; loss of appetite; nausea; right upper belly pain; unusually weak or tired; yellowing of the eyes or skin stomach pain trouble passing urine or change in the amount of urine weight gain or weight loss Side effects that usually do not require medical attention (report these to your doctor or health care professional if they continue or are bothersome): bone pain constipation loss of appetite muscle pain nausea swelling of the ankles, feet, hands tiredness This list may not describe all possible side effects. Call your doctor for medical advice  about side effects. You may report side effects to FDA at 1-800-FDA-1088. Where should I keep my medication? This drug is given in a hospital or clinic and will not be stored at home. NOTE: This sheet is a summary. It may not cover all possible information. If you have questions about this medicine, talk to your doctor, pharmacist, or health care provider.  2022 Elsevier/Gold Standard (2019-05-30 13:01:29)

## 2021-01-13 ENCOUNTER — Encounter (HOSPITAL_COMMUNITY): Payer: Self-pay | Admitting: Pulmonary Disease

## 2021-01-13 ENCOUNTER — Other Ambulatory Visit: Payer: Self-pay

## 2021-01-13 NOTE — Anesthesia Preprocedure Evaluation (Addendum)
Anesthesia Evaluation  Patient identified by MRN, date of birth, ID band Patient awake    Reviewed: Allergy & Precautions, NPO status , Patient's Chart, lab work & pertinent test results  Airway Mallampati: II  TM Distance: >3 FB Neck ROM: Full    Dental no notable dental hx. (+) Teeth Intact, Dental Advisory Given   Pulmonary COPD,  COPD inhaler, former smoker (77 pack years),  Stage IIIA (TX, N2, M0) non-small cell lung cancer, squamous cell carcinoma presented with subcarinal mass/lymphadenopathy diagnosed in July 2022.   Pulmonary exam normal breath sounds clear to auscultation       Cardiovascular hypertension, + CAD and + Past MI  Normal cardiovascular exam Rhythm:Regular Rate:Normal  45mm thoracic aortic aneurysm  TTE 2022 1. Left ventricular ejection fraction, by estimation, is 55 to 60%. Left  ventricular ejection fraction by 3D volume is 62 %. The left ventricle has  normal function. The left ventricle has no regional wall motion  abnormalities. Left ventricular diastolic  parameters are indeterminate. The average left ventricular global  longitudinal strain is -17.7 %. The global longitudinal strain is normal.  2. Right ventricular systolic function is normal. The right ventricular  size is normal. Tricuspid regurgitation signal is inadequate for assessing  PA pressure.  3. Left atrial size was mildly dilated.  4. The mitral valve is normal in structure. Trivial mitral valve  regurgitation. No evidence of mitral stenosis.  5. The aortic valve is tricuspid. Aortic valve regurgitation is trivial.  No aortic stenosis is present.  6. Aortic dilatation noted. There is mild dilatation of the ascending  aorta, measuring 40 mm.  7. The inferior vena cava is normal in size with greater than 50%  respiratory variability, suggesting right atrial pressure of 3 mmHg  LHC 2019 1. Eccentric calcification with mild  stenosis of the ostial LAD (~20%).  Otherwise, no angiographically significant coronary artery disease. 2. Normal left ventricular systolic function and filling pressure.    Neuro/Psych PSYCHIATRIC DISORDERS Anxiety Depression negative neurological ROS     GI/Hepatic Neg liver ROS, GERD  Medicated and Controlled,  Endo/Other  negative endocrine ROS  Renal/GU negative Renal ROS  negative genitourinary   Musculoskeletal negative musculoskeletal ROS (+)   Abdominal   Peds  Hematology negative hematology ROS (+)   Anesthesia Other Findings   Reproductive/Obstetrics                            Anesthesia Physical Anesthesia Plan  ASA: 3  Anesthesia Plan: General   Post-op Pain Management:    Induction: Intravenous  PONV Risk Score and Plan: 3 and Midazolam, Dexamethasone and Ondansetron  Airway Management Planned: Oral ETT  Additional Equipment:   Intra-op Plan:   Post-operative Plan: Extubation in OR  Informed Consent: I have reviewed the patients History and Physical, chart, labs and discussed the procedure including the risks, benefits and alternatives for the proposed anesthesia with the patient or authorized representative who has indicated his/her understanding and acceptance.     Dental advisory given  Plan Discussed with: CRNA  Anesthesia Plan Comments:         Anesthesia Quick Evaluation

## 2021-01-13 NOTE — Progress Notes (Signed)
PCP - Tysinger, Camelia Eng, PA-C Cardiologist - Werner Lean, MD EKG - 12/03/20 Chest x-ray -  ECHO - 12/29/20 Cardiac Cath - 11/07/17 CPAP -     Blood Thinner Instructions:  Aspirin Instructions: per pt not going to take ASA DOS  ERAS Protcol -   COVID TEST- 01/11/21 negative   Anesthesia review: n/a  -------------  SDW INSTRUCTIONS:  Your procedure is scheduled on Thursday 10/13. Please report to [facility] Main Entrance "A" at 0530 A.M., and check in at the Admitting office. Call this number if you have problems the morning of surgery: [phone]   Remember: Do not eat or drink after midnight the night before your surgery   Medications to take morning of surgery with a sip of water include: buPROPion (WELLBUTRIN XL) esomeprazole (NEXIUM)  fluticasone (FLONASE) rosuvastatin (CRESTOR)   If needed: acetaminophen (TYLENOL) albuterol (PROAIR HFA) --- Please bring all inhalers with you the day of surgery.    As of today, STOP taking any Aspirin (unless otherwise instructed by your surgeon), Aleve, Naproxen, Ibuprofen, Motrin, Advil, Goody's, BC's, all herbal medications, fish oil, and all vitamins.    The Morning of Surgery Do not wear jewelry, make-up or nail polish. Do not wear lotions, powders, or perfumes, or deodorant Do not shave 48 hours prior to surgery.    Do not bring valuables to the hospital. [facility] is not responsible for any belongings or valuables.  If you are a smoker, DO NOT Smoke 24 hours prior to surgery  If you wear a CPAP at night please bring your mask the morning of surgery   Remember that you must have someone to transport you home after your surgery, and remain with you for 24 hours if you are discharged the same day.  Please bring cases for contacts, glasses, hearing aids, dentures or bridgework because it cannot be worn into surgery.   Patients discharged the day of surgery will not be allowed to drive home.   Please shower  the NIGHT BEFORE/MORNING OF SURGERY (use antibacterial soap like DIAL soap if possible). Wear comfortable clothes the morning of surgery. Oral Hygiene is also important to reduce your risk of infection.  Remember - BRUSH YOUR TEETH THE MORNING OF SURGERY WITH YOUR REGULAR TOOTHPASTE  Patient denies shortness of breath, fever, cough and chest pain.

## 2021-01-14 ENCOUNTER — Encounter (HOSPITAL_COMMUNITY)
Admission: RE | Disposition: A | Discharge status: Home / Self Care | Payer: Self-pay | Source: Home / Self Care | Attending: Pulmonary Disease

## 2021-01-14 ENCOUNTER — Ambulatory Visit (HOSPITAL_COMMUNITY): Payer: Medicare Other | Admitting: Anesthesiology

## 2021-01-14 ENCOUNTER — Encounter (HOSPITAL_COMMUNITY): Payer: Self-pay | Admitting: Pulmonary Disease

## 2021-01-14 ENCOUNTER — Ambulatory Visit (HOSPITAL_COMMUNITY)
Admission: RE | Admit: 2021-01-14 | Discharge: 2021-01-14 | Disposition: A | Discharge status: Home / Self Care | Payer: Medicare Other | Attending: Pulmonary Disease | Admitting: Pulmonary Disease

## 2021-01-14 ENCOUNTER — Ambulatory Visit: Payer: Medicare Other

## 2021-01-14 ENCOUNTER — Other Ambulatory Visit: Payer: Medicare Other

## 2021-01-14 DIAGNOSIS — Z87891 Personal history of nicotine dependence: Secondary | ICD-10-CM | POA: Insufficient documentation

## 2021-01-14 DIAGNOSIS — C3491 Malignant neoplasm of unspecified part of right bronchus or lung: Secondary | ICD-10-CM | POA: Diagnosis not present

## 2021-01-14 DIAGNOSIS — J984 Other disorders of lung: Secondary | ICD-10-CM | POA: Insufficient documentation

## 2021-01-14 DIAGNOSIS — R590 Localized enlarged lymph nodes: Secondary | ICD-10-CM | POA: Insufficient documentation

## 2021-01-14 DIAGNOSIS — J471 Bronchiectasis with (acute) exacerbation: Secondary | ICD-10-CM | POA: Diagnosis not present

## 2021-01-14 DIAGNOSIS — Z79899 Other long term (current) drug therapy: Secondary | ICD-10-CM | POA: Diagnosis not present

## 2021-01-14 DIAGNOSIS — Z7982 Long term (current) use of aspirin: Secondary | ICD-10-CM | POA: Diagnosis not present

## 2021-01-14 DIAGNOSIS — R918 Other nonspecific abnormal finding of lung field: Secondary | ICD-10-CM | POA: Diagnosis not present

## 2021-01-14 DIAGNOSIS — Z006 Encounter for examination for normal comparison and control in clinical research program: Secondary | ICD-10-CM | POA: Insufficient documentation

## 2021-01-14 DIAGNOSIS — J9819 Other pulmonary collapse: Secondary | ICD-10-CM | POA: Diagnosis not present

## 2021-01-14 HISTORY — PX: BRONCHIAL BRUSHINGS: SHX5108

## 2021-01-14 HISTORY — PX: VIDEO BRONCHOSCOPY: SHX5072

## 2021-01-14 HISTORY — PX: BIOPSY: SHX5522

## 2021-01-14 HISTORY — PX: BRONCHIAL WASHINGS: SHX5105

## 2021-01-14 SURGERY — VIDEO BRONCHOSCOPY WITHOUT FLUORO
Anesthesia: General | Laterality: Right

## 2021-01-14 MED ORDER — LACTATED RINGERS IV SOLN: INTRAVENOUS | Status: DC | PRN

## 2021-01-14 MED ORDER — SUGAMMADEX SODIUM 200 MG/2ML IV SOLN
INTRAVENOUS | Status: DC | PRN
  Administered 2021-01-14: 100 mg via INTRAVENOUS

## 2021-01-14 MED ORDER — ROCURONIUM BROMIDE 10 MG/ML (PF) SYRINGE
PREFILLED_SYRINGE | INTRAVENOUS | Status: DC | PRN
  Administered 2021-01-14: 10 mg via INTRAVENOUS

## 2021-01-14 MED ORDER — PROPOFOL 10 MG/ML IV BOLUS
INTRAVENOUS | Status: DC | PRN
  Administered 2021-01-14: 20 mg via INTRAVENOUS
  Administered 2021-01-14: 120 mg via INTRAVENOUS

## 2021-01-14 MED ORDER — CHLORHEXIDINE GLUCONATE 0.12 % MT SOLN
OROMUCOSAL | Status: AC
  Administered 2021-01-14: 15 mL
  Filled 2021-01-14: qty 15

## 2021-01-14 MED ORDER — DEXAMETHASONE SODIUM PHOSPHATE 10 MG/ML IJ SOLN
INTRAMUSCULAR | Status: DC | PRN
  Administered 2021-01-14: 10 mg via INTRAVENOUS

## 2021-01-14 MED ORDER — SUCCINYLCHOLINE CHLORIDE 200 MG/10ML IV SOSY
PREFILLED_SYRINGE | INTRAVENOUS | Status: DC | PRN
  Administered 2021-01-14: 160 mg via INTRAVENOUS

## 2021-01-14 MED ORDER — ACETAMINOPHEN 500 MG PO TABS
1000.0000 mg | ORAL_TABLET | Freq: Once | ORAL | Status: AC
  Administered 2021-01-14: 1000 mg via ORAL
  Filled 2021-01-14: qty 2

## 2021-01-14 MED ORDER — MIDAZOLAM HCL (PF) 5 MG/ML IJ SOLN
INTRAMUSCULAR | Status: AC
  Filled 2021-01-14: qty 1

## 2021-01-14 MED ORDER — FENTANYL CITRATE (PF) 250 MCG/5ML IJ SOLN
INTRAMUSCULAR | Status: DC | PRN
  Administered 2021-01-14: 50 ug via INTRAVENOUS

## 2021-01-14 MED ORDER — LIDOCAINE 2% (20 MG/ML) 5 ML SYRINGE
INTRAMUSCULAR | Status: DC | PRN
  Administered 2021-01-14: 50 mg via INTRAVENOUS

## 2021-01-14 MED ORDER — ONDANSETRON HCL 4 MG/2ML IJ SOLN
INTRAMUSCULAR | Status: DC | PRN
  Administered 2021-01-14: 4 mg via INTRAVENOUS

## 2021-01-14 MED ORDER — FENTANYL CITRATE (PF) 100 MCG/2ML IJ SOLN
INTRAMUSCULAR | Status: AC
  Filled 2021-01-14: qty 2

## 2021-01-14 MED ORDER — PHENYLEPHRINE 40 MCG/ML (10ML) SYRINGE FOR IV PUSH (FOR BLOOD PRESSURE SUPPORT)
PREFILLED_SYRINGE | INTRAVENOUS | Status: DC | PRN
  Administered 2021-01-14: 80 ug via INTRAVENOUS

## 2021-01-14 MED ORDER — FENTANYL CITRATE (PF) 100 MCG/2ML IJ SOLN: 25.0000 ug | INTRAMUSCULAR | Status: DC | PRN

## 2021-01-14 MED ORDER — MIDAZOLAM HCL 5 MG/5ML IJ SOLN
INTRAMUSCULAR | Status: DC | PRN
  Administered 2021-01-14: 1 mg via INTRAVENOUS

## 2021-01-14 NOTE — Discharge Instructions (Signed)
Flexible Bronchoscopy, Care After This sheet gives you information about how to care for yourself after your test. Your doctor may also give you more specific instructions. If you have problems or questions, contact your doctor. Follow these instructions at home: Eating and drinking Do not eat or drink anything (not even water) for 2 hours after your test, or until your numbing medicine (local anesthetic) wears off. When your numbness is gone and your cough and gag reflexes have come back, you may: Eat only soft foods. Slowly drink liquids. The day after the test, go back to your normal diet. Driving Do not drive for 24 hours if you were given a medicine to help you relax (sedative). Do not drive or use heavy machinery while taking prescription pain medicine. General instructions  Take over-the-counter and prescription medicines only as told by your doctor. Return to your normal activities as told. Ask what activities are safe for you. Do not use any products that have nicotine or tobacco in them. This includes cigarettes and e-cigarettes. If you need help quitting, ask your doctor. Keep all follow-up visits as told by your doctor. This is important. It is very important if you had a tissue sample (biopsy) taken. Get help right away if: You have shortness of breath that gets worse. You get light-headed. You feel like you are going to pass out (faint). You have chest pain. You cough up: More than a little blood. More blood than before. Summary Do not eat or drink anything (not even water) for 2 hours after your test, or until your numbing medicine wears off. Do not use cigarettes. Do not use e-cigarettes. Get help right away if you have chest pain.  This information is not intended to replace advice given to you by your health care provider. Make sure you discuss any questions you have with your health care provider. Document Released: 01/16/2009 Document Revised: 03/03/2017 Document  Reviewed: 04/08/2016 Elsevier Patient Education  2020 [employer].

## 2021-01-14 NOTE — Transfer of Care (Signed)
Immediate Anesthesia Transfer of Care Note  Patient: [name]  Procedure(s) Performed: VIDEO BRONCHOSCOPY WITHOUT FLUORO (Right) BRONCHIAL WASHINGS BRONCHIAL BRUSHINGS  Patient Location: PACU  Anesthesia Type:General  Level of Consciousness: awake  Airway & Oxygen Therapy: Patient Spontanous Breathing and Patient connected to nasal cannula oxygen  Post-op Assessment: Report given to RN and Post -op Vital signs reviewed and stable  Post vital signs: Reviewed and stable  Last Vitals:  Vitals Value Taken Time  BP 118/91 01/14/21 0828  Temp    Pulse 90 01/14/21 0829  Resp 21 01/14/21 0829  SpO2 84 % 01/14/21 0829  Vitals shown include unvalidated device data.  Last Pain:  Vitals:   01/14/21 4403  TempSrc:   PainSc: 0-No pain         Complications: No notable events documented.

## 2021-01-14 NOTE — Anesthesia Procedure Notes (Addendum)
Procedure Name: Intubation Date/Time: 01/14/2021 7:49 AM Performed by: Jenne Campus, CRNA Pre-anesthesia Checklist: Patient identified, Emergency Drugs available, Suction available and Patient being monitored Patient Re-evaluated:Patient Re-evaluated prior to induction Oxygen Delivery Method: Circle System Utilized Preoxygenation: Pre-oxygenation with 100% oxygen Induction Type: IV induction Ventilation: Mask ventilation without difficulty Laryngoscope Size: Miller and 2 Grade View: Grade I Tube type: Oral Tube size: 8.5 mm Number of attempts: 1 Airway Equipment and Method: Stylet Placement Confirmation: ETT inserted through vocal cords under direct vision, positive ETCO2 and breath sounds checked- equal and bilateral Secured at: 21 cm Tube secured with: Tape Dental Injury: Teeth and Oropharynx as per pre-operative assessment

## 2021-01-14 NOTE — Interval H&P Note (Signed)
History and Physical Interval Note:  01/14/2021 7:34 AM  Katrina Campbell  has presented today for surgery, with the diagnosis of lung mass.  The various methods of treatment have been discussed with the patient and family. After consideration of risks, benefits and other options for treatment, the patient has consented to  Procedure(s) with comments: VIDEO BRONCHOSCOPY WITHOUT FLUORO (Right) - possible cryotherapy as a surgical intervention.  The patient's history has been reviewed, patient examined, no change in status, stable for surgery.  I have reviewed the patient's chart and labs.  Questions were answered to the patient's satisfaction.     [place]

## 2021-01-14 NOTE — H&P (Signed)
Synopsis: Referred in January 2021 for former smoker quit 2014 moderate COPD, MAI by Carlena Hurl, PA-C   Subjective:    PATIENT ID: Katrina Campbell GENDER: female DOB: 09-Apr-1955, MRN: [id number]       Chief Complaint  Patient presents with   Post-op Follow-up      Scratchy throat otherwise doing ok        [name] moderate COPD, former smoker quit 2014, MAI patient last seen in the office by Dr. Vaughan Browner.  Former Dr. Lenna Gilford patient.  Treated for stenotrophomonas in the past.  Prior 3 sputum's AFB, 1/3+ for MAI.  Saw infectious disease to discuss initiation of therapy.  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  Patient was last seen by infectious disease on 04/15/2019.  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  Just started on azithromycin 500 mg daily, rifampin 600 mg daily plus ethambutol 900 mg daily.  Patient does have CT imaging with lower lobe bronchiectasis.  Patient has chronic sputum production.  She is short of breath with daily cough and sputum production.  During this time she is very anxious and hesitant about going out in public due to her chronic cough.  She denies hemoptysis patient's weight has also been stable.   OV 11/06/2019: around the 4th of July patient had congestion and cough that still hasnt really gone away.  Overall doing much better now.  Has less cough and congestion.  Recent follow-up with infectious disease plan for Monday.  Been compliant with her medications.  Still has some daily sputum production.  Rarely has a streak of blood with cough.   OV 04/14/2020: Here today for follow-up.  Establish care with me back last year.  Was already under treatment for her MAI.  Followed up with infectious disease here in [place] as well as met with ID at [facility].  Decision was made to come off of  treatments.  Recent sputum's have been negative.  She does have significant emphysema currently compliant with inhaler regimen.  From a respiratory standpoint she is doing fine.  No significant symptom change after stopping antibiotics.    OV 10/01/2020: Here today for follow-up had abnormal lung cancer screening CT follow-up with an enlarged mediastinal node.  Patient was sent for nuclear medicine PET scan.  PET scan revealed a significantly PET avid station 7 subcarinal node with SUV of 11.  We reviewed this today in the office.  Concerning for underlying malignancy.   OV 10/09/2020: Here today for follow-up.  Recent CT scan of the chest had enlarged mediastinal node.  PET scan with PET avid station 7 and irregular lining of the right mainstem.  Patient was taken for video bronchoscopy on 10/08/2020.  Bronchoscopy revealed enlarged subcarinal adenopathy which was sampled under ultrasound guidance.  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  Endobronchial brushings, forcep biopsies of the abnormal mucosa and visible tumor were taken.   01/14/2021: Here today for outpatient bronchoscopy        Past Medical History:  Diagnosis Date   Anxiety     COPD (chronic obstructive pulmonary disease) ([place])     Coronary artery disease     Dental staining 07/15/2019   Depression     Hyperlipidemia     Labile hypertension  Myocardial infarction [facility])     Pneumonia     Pulmonary mycobacterial infection ([place]) 10/24/2018           Family History  Problem Relation Age of Onset   Heart attack Mother     Emphysema Mother     Congestive Heart Failure Mother     Heart attack Father     Congestive Heart Failure Father     Asthma Other     Lung disease Sister     Lung disease Brother     Colon cancer Neg Hx             Past Surgical History:  Procedure Laterality Date   BRONCHIAL BIOPSY   10/08/2020    Procedure:  BRONCHIAL BIOPSIES;  Surgeon: Garner Nash, DO;  Location: [place] [facility];  Service: Pulmonary;;   BRONCHIAL BRUSHINGS   10/08/2020    Procedure: BRONCHIAL BRUSHINGS;  Surgeon: Garner Nash, DO;  Location: [place] [facility];  Service: Pulmonary;;   BRONCHIAL WASHINGS   10/08/2020    Procedure: BRONCHIAL WASHINGS;  Surgeon: Garner Nash, DO;  Location: [place];  Service: Pulmonary;;   FINE NEEDLE ASPIRATION   10/08/2020    Procedure: FINE NEEDLE ASPIRATION (FNA) LINEAR;  Surgeon: Garner Nash, DO;  Location: [place] [facility];  Service: Pulmonary;;   LEFT HEART CATH AND CORONARY ANGIOGRAPHY N/A 11/07/2017    Procedure: LEFT HEART CATH AND CORONARY ANGIOGRAPHY;  Surgeon: Nelva Bush, MD;  Location: [place] [facility];  Service: Cardiovascular;  Laterality: N/A;   NASAL SINUS SURGERY   1986   VIDEO BRONCHOSCOPY Bilateral 01/29/2019    Procedure: VIDEO BRONCHOSCOPY WITH FLUORO;  Surgeon: Marshell Garfinkel, MD;  Location: [place];  Service: Cardiopulmonary;  Laterality: Bilateral;   VIDEO BRONCHOSCOPY WITH ENDOBRONCHIAL ULTRASOUND N/A 10/08/2020    Procedure: VIDEO BRONCHOSCOPY WITH ENDOBRONCHIAL ULTRASOUND;  Surgeon: Garner Nash, DO;  Location: [place];  Service: Pulmonary;  Laterality: N/A;      Social History         Socioeconomic History   Marital status: Married      Spouse name: Not on file   Number of children: Not on file   Years of education: Not on file   Highest education level: Not on file  Occupational History   Not on file  Tobacco Use   Smoking status: Former      Packs/day: 1.75      Years: 44.00      Pack years: 77.00      Types: Cigarettes      Quit date: 11/09/2012      Years since quitting: 7.9   Smokeless tobacco: Never   Tobacco comments:      vapor  Vaping Use   Vaping Use: Former  Substance and Sexual Activity   Alcohol use: Not Currently      Alcohol/week: 24.0 standard drinks      Types: 24 Cans of beer per week   Drug use: Not Currently    Sexual activity: Not Currently  Other Topics Concern   Not on file  Social History Narrative   Not on file    Social Determinants of Health    Financial Resource Strain: Not on file  Food Insecurity: Not on file  Transportation Needs: Not on file  Physical Activity: Not on file  Stress: Not on file  Social Connections: Not on file  Intimate Partner Violence: Not on file      No Known Allergies  Outpatient Medications Prior to Visit  Medication Sig Dispense Refill   albuterol (PROAIR HFA) 108 (90 Base) MCG/ACT inhaler Inhale 2 puffs into the lungs every 6 (six) hours as needed for wheezing or shortness of breath. 24 g 0   albuterol (PROVENTIL) (2.5 MG/3ML) 0.083% nebulizer solution Take 3 mLs (2.5 mg total) by nebulization every 6 (six) hours as needed for wheezing or shortness of breath. 540 mL 3   aspirin EC 81 MG tablet Take 81 mg by mouth daily with lunch.        buPROPion (WELLBUTRIN XL) 300 MG 24 hr tablet Take 1 tablet (300 mg total) by mouth daily. 90 tablet 3   Cholecalciferol (DIALYVITE VITAMIN D 5000) 125 MCG (5000 UT) capsule Take 5,000 Units by mouth daily.       Coenzyme Q10 (COQ-10) 100 MG CAPS Take 100 mg by mouth daily.       fluticasone (FLONASE) 50 MCG/ACT nasal spray Place 1-2 sprays into both nostrils as needed for allergies or rhinitis.       Guaifenesin (MUCINEX MAXIMUM STRENGTH) 1200 MG TB12 Take 1,200 mg by mouth daily.       Magnesium 500 MG TABS Take 500 mg by mouth daily with lunch.       Multiple Vitamins-Minerals (IMMUNE SUPPORT PO) Take 2 tablets by mouth daily.       Respiratory Therapy Supplies (FLUTTER) DEVI Use as directed 1 each 0   rosuvastatin (CRESTOR) 10 MG tablet Take 1 tablet (10 mg total) by mouth daily. 90 tablet 3   temazepam (RESTORIL) 15 MG capsule TAKE 1 CAPSULE AT BEDTIME AS NEEDED FOR SLEEP (NEED OFFICE VISIT) 90 capsule 0   umeclidinium-vilanterol (ANORO ELLIPTA) 62.5-25 MCG/INH AEPB Inhale 1 puff into the lungs daily.  180 each 3   vitamin B-12 (CYANOCOBALAMIN) 1000 MCG tablet Take 1,000 mcg by mouth daily.       Vitamin D, Ergocalciferol, (DRISDOL) 1.25 MG (50000 UNIT) CAPS capsule Take 50,000 Units by mouth every 7 (seven) days.        No facility-administered medications prior to visit.    Review of Systems  Constitutional:  Negative for chills, fever, malaise/fatigue and weight loss.  HENT:  Negative for hearing loss, sore throat and tinnitus.   Eyes:  Negative for blurred vision and double vision.  Respiratory:  Negative for cough, hemoptysis, sputum production, shortness of breath, wheezing and stridor.   Cardiovascular:  Negative for chest pain, palpitations, orthopnea, leg swelling and PND.  Gastrointestinal:  Negative for abdominal pain, constipation, diarrhea, heartburn, nausea and vomiting.  Genitourinary:  Negative for dysuria, hematuria and urgency.  Musculoskeletal:  Negative for joint pain and myalgias.  Skin:  Negative for itching and rash.  Neurological:  Negative for dizziness, tingling, weakness and headaches.  Endo/Heme/Allergies:  Negative for environmental allergies. Does not bruise/bleed easily.  Psychiatric/Behavioral:  Negative for depression. The patient is not nervous/anxious and does not have insomnia.   All other systems reviewed and are negative.    Objective:   General appearance: 65 y.o., female, NAD, conversant  Eyes: anicteric sclerae, moist conjunctivae HENT: NCAT; oropharynx, MMM, no mucosal ulcerations; normal hard and soft palate Neck: Trachea midline; FROM, supple, lymphadenopathy, no JVD Lungs: CTAB, no crackles, no wheeze, with normal respiratory effort and no intercostal retractions CV: RRR, S1, S2, no MRGs  Abdomen: Soft, non-tender; non-distended, BS present  Extremities: No peripheral edema, radial and DP pulses present bilaterally  Skin: Normal temperature, turgor and texture; no rash Psych: Appropriate affect  Neuro: Alert and oriented to person and  place, no focal deficit            Vitals:    10/09/20 1333  BP: (!) 148/78  Pulse: (!) 59  SpO2: 98%  Weight: 130 lb (59 kg)  Height: _0  (1.626 m)      98% on RA    BMI Readings from Last 3 Encounters:  10/09/20 22.31 kg/m  10/08/20 21.80 kg/m  10/01/20 21.80 kg/m       Wt Readings from Last 3 Encounters:  10/09/20 130 lb (59 kg)  10/08/20 127 lb (57.6 kg)  10/01/20 127 lb (57.6 kg)        CBC Labs (Brief)          Component Value Date/Time    WBC 6.0 09/22/2020 1325    WBC 5.4 05/29/2018 1232    RBC 4.64 09/22/2020 1325    RBC 4.78 05/29/2018 1232    HGB 13.7 09/22/2020 1325    HCT 41.2 09/22/2020 1325    PLT 143 (L) 09/22/2020 1325    MCV 89 09/22/2020 1325    MCH 29.5 09/22/2020 1325    MCH 29.1 05/29/2018 1232    MCHC 33.3 09/22/2020 1325    MCHC 31.7 05/29/2018 1232    RDW 12.9 09/22/2020 1325    LYMPHSABS 2.1 09/22/2020 1325    MONOABS 0.5 11/06/2017 2214    EOSABS 0.1 09/22/2020 1325    BASOSABS 0.0 09/22/2020 1325        Chest Imaging:   May 2020 CT chest: Scattered nodules.  Lower lobe small areas of bronchiectasis. The patient's images have been independently reviewed by me.    PET scan 09/29/2020: SUV of 11, subcarinal adenopathy concerning for malignancy. The patient's images have been independently reviewed by me.    Oct 2022: CT chest with RML collapse  The patient's images have been independently reviewed by me.     Pulmonary Functions Testing Results: No flowsheet data found.   FeNO: none    Pathology: none    Echocardiogram: none   Heart Catheterization: none     Assessment & Plan:        ICD-10-CM    1. 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 Lymphadenopathy, mediastinal  R59.0       3. Pulmonary Mycobacterium avium complex (MAC) infection ([place])  A31.0       4. Stage 2 moderate COPD by GOLD classification ([place])  J44.9       5. Bronchiectasis with acute exacerbation (HCC)  J47.1              Discussion:   65 year old female, moderate COPD FEV1 66%, longstanding history of smoking was enrolled in our lung cancer screening program.  Additionally has nodules and was managed for pulmonary MAC.  Breathing however has been stable.  Recent CT scan showed enlarged subcarinal adenopathy.  PET scan revealed PET avid lesion.  Patient taken for bronchoscopy on 10/08/2020 which revealed right-sided endobronchial cancer as well as associated adenopathy.  Patient had tissue sampling obtained.  Pathology has not returned yet.  Recent ct shows RML collapse  Plan: Plans for video bronchoscopy today  Possible cryotherapy if there is recurrent endobronchial disease We discussed risks and benefits    Garner Nash, DO [facility] 01/14/2021 7:31 AM

## 2021-01-14 NOTE — Anesthesia Postprocedure Evaluation (Signed)
Anesthesia Post Note  Patient: Katrina Campbell  Procedure(s) Performed: VIDEO BRONCHOSCOPY WITHOUT FLUORO (Right) BRONCHIAL WASHINGS BRONCHIAL BRUSHINGS BIOPSY     Patient location during evaluation: PACU Anesthesia Type: General Level of consciousness: awake and alert Pain management: pain level controlled Vital Signs Assessment: post-procedure vital signs reviewed and stable Respiratory status: spontaneous breathing, nonlabored ventilation, respiratory function stable and patient connected to nasal cannula oxygen Cardiovascular status: blood pressure returned to baseline and stable Postop Assessment: no apparent nausea or vomiting Anesthetic complications: no   No notable events documented.  Last Vitals:  Vitals:   01/14/21 0551 01/14/21 0830  BP: (!) 141/77   Pulse: 87   Resp: 18   Temp: 36.8 C 36.7 C  SpO2: 97%     Last Pain:  Vitals:   01/14/21 0830  TempSrc:   PainSc: 0-No pain                 Shawni Volkov L Janalyn Higby

## 2021-01-14 NOTE — Op Note (Signed)
Video Bronchoscopy Procedure Note  Date of Operation: 01/14/2021  Pre-op Diagnosis: Right middle lobe collapse  Post-op Diagnosis: Right middle lobe collapse  Surgeon: Garner Nash, DO   Assistants: none  Anesthesia: General   Operation: Flexible video fiberoptic bronchoscopy and biopsies.  Estimated Blood Loss: <1OX  Complications: none noted  Indications and History: Airiel Oblinger is 65 y.o. with history of stage IIIb lung cancer, right middle lobe collapse.  Recommendation was to perform video fiberoptic bronchoscopy with biopsies. The risks, benefits, complications, treatment options and expected outcomes were discussed with the patient.  The possibilities of pneumothorax, pneumonia, reaction to medication, pulmonary aspiration, perforation of a viscus, bleeding, failure to diagnose a condition and creating a complication requiring transfusion or operation were discussed with the patient who freely signed the consent.    Description of Procedure: The patient was seen in the Preoperative Area, was examined and was deemed appropriate to proceed.  The patient was taken to endoscopy room 3, identified as [name] and the procedure verified as Flexible Video Fiberoptic Bronchoscopy.  A Time Out was held and the above information confirmed.   Standard therapeutic bronchoscope was inserted through the patient's endotracheal tube placed by anesthesia.  Upon entry to the airway there was significant mount of thick clear and yellow secretions along the posterior lateral gutters of the trachea.  There is visible friable bleeding mucosa of the right upper lobe apical and anterior segment with old dried blood clot.  The right middle lobe was obstructed with thick tenacious secretions.  Saline was used for irrigation and removal and therapeutic aspiration of the bilateral mainstem's was necessary to remove all remaining blood clot, debris and tenacious secretions.  There was significant friable  mucosa along the right mainstem medial wall likely related to her recent radiation treatments.  The right middle lobe opening was patent except for the medial segment.  There appears to be something with extrinsic compression of the medial segment but no obvious irregular mucosa.  There was friable mucosa at the opening of the right middle lobe as well.  There was some small lesions that appear to be studding along the medial aspect of the right mainstem and the anterior wall just proximal to the opening of the right middle lobe.  We completed a BAL to the right middle lobe to be sent for cytology and cultures.  We used a cytology brush to obtain specimens from this study area along the right medial portion of the right mainstem.  We also obtained endobronchial forcep biopsies using the 2.8 mm [employer] forceps along the right mainstem.  Patient tolerated this procedure well there was no significant active bleeding at the termination of the procedure the bronchoscope was brought to just above the main carina for observation.  The bronchoscope was removed and airway.  Samples: 1. Endobronchial brushings from Right main stem  2. Endobronchial forceps biopsies from Right main stem  3. Bronchoalveolar lavage Right middle lobe   Plans:  We will review the cytology, pathology and microbiology results with the patient when they become available.  Outpatient followup will be with Dr. Valeta Harms, DO.  Garner Nash, DO [facility] 01/14/2021 8:22 AM

## 2021-01-15 LAB — CYTOLOGY - NON PAP

## 2021-01-15 LAB — ACID FAST SMEAR (AFB, MYCOBACTERIA): Acid Fast Smear: NEGATIVE

## 2021-01-17 ENCOUNTER — Encounter (HOSPITAL_COMMUNITY): Payer: Self-pay | Admitting: Pulmonary Disease

## 2021-01-18 ENCOUNTER — Telehealth: Payer: Self-pay | Admitting: Acute Care

## 2021-01-18 ENCOUNTER — Other Ambulatory Visit: Payer: Self-pay

## 2021-01-18 ENCOUNTER — Ambulatory Visit (INDEPENDENT_AMBULATORY_CARE_PROVIDER_SITE_OTHER): Payer: Medicare Other

## 2021-01-18 ENCOUNTER — Encounter: Payer: Self-pay | Admitting: Acute Care

## 2021-01-18 ENCOUNTER — Other Ambulatory Visit: Payer: Self-pay | Admitting: Acute Care

## 2021-01-18 ENCOUNTER — Ambulatory Visit (INDEPENDENT_AMBULATORY_CARE_PROVIDER_SITE_OTHER): Payer: Medicare Other | Admitting: Acute Care

## 2021-01-18 ENCOUNTER — Telehealth: Payer: Self-pay | Admitting: *Deleted

## 2021-01-18 VITALS — BP 136/80 | HR 98 | Temp 98.9°F | Ht 64.0 in | Wt 128.8 lb

## 2021-01-18 DIAGNOSIS — R0602 Shortness of breath: Secondary | ICD-10-CM | POA: Diagnosis not present

## 2021-01-18 DIAGNOSIS — J479 Bronchiectasis, uncomplicated: Secondary | ICD-10-CM

## 2021-01-18 DIAGNOSIS — C3491 Malignant neoplasm of unspecified part of right bronchus or lung: Secondary | ICD-10-CM

## 2021-01-18 DIAGNOSIS — R051 Acute cough: Secondary | ICD-10-CM | POA: Diagnosis not present

## 2021-01-18 DIAGNOSIS — K219 Gastro-esophageal reflux disease without esophagitis: Secondary | ICD-10-CM | POA: Diagnosis not present

## 2021-01-18 DIAGNOSIS — I2510 Atherosclerotic heart disease of native coronary artery without angina pectoris: Secondary | ICD-10-CM

## 2021-01-18 LAB — SURGICAL PATHOLOGY

## 2021-01-18 LAB — CULTURE, BAL-QUANTITATIVE W GRAM STAIN: Culture: 40000 — AB

## 2021-01-18 LAB — CYTOLOGY - NON PAP

## 2021-01-18 NOTE — Progress Notes (Signed)
History of Present Illness Katrina Campbell is a 65 y.o. female former smoker ( Quit 2014 with a 77 pack year smoking history), with COPD,( moderate COPD FEV1 66% predicted,) bronchiectasis and MAI treated 2021 by ID. She also had  new diagnosis of non-small cell lung cancer 09/2020. She is followed by Dr. Valeta Harms and Dr. Julien Nordmann.    Synopsis of most recent NSCLC treatment: Katrina Campbell  has been treated with concurrent chemoradiation with weekly carboplatin for AUC of 2 and paclitaxel 45 Mg/M2 status post 7 cycles.Repeat scanning showed  improvement of the subcarinal lymph node that was the original presentation of her disease.  It also showed collapse of the right middle lobe that could be secondary to scarring from radiation, mucous plugging or central lung mass.For the right middle lobe collapse, she was referred back to  Dr. Valeta Harms for repeat bronchoscopy and evaluation for any central obstructing lesion or mucous plugging. The brushing of the right mainstem was identified as a non-small cell carcinoma.  Dr. Worthy Flank plan was  to proceed with the consolidation treatment with immunotherapy with Imfinzi 1500 Mg IV every 4 weeks.  She was interested in proceeding with the treatment and expected to start the first cycle of this treatment next week.  When I called to review her pathology with her, she was complaining of a worsening dyspnea and cough since bronchoscopy. No hemoptysis. Additionally her BAL was + for  40,000 colonies of Neisseria SICCA.  I have scheduled her for an appointment today at 4 pm in the office with stat CXR prior . While this is a colonizing organism, I want to evaluate her , as I have a low threshold for antimicrobial treatment with her recent chemotherapy, and immunocompromised status.   Increased shortness of breath and wheezing. Increased cough. She did get some hycodan yesterday for her cough per Dr. Earlie Server. She is afraid to take it as she has an allergy to Codeine. In the past the  codeine has made her pass out.    01/18/2021 Pt states she has had worsening cough with secretions that are clear to yellow with some occasional blood initially after her bronchoscopy, that has subsequently cleared. . She has coughing spells where she cannnot catch her breath. She feels this may be related to the radiation treatments she got, as she didn't have much of a cough until she started radiation. ( 11/14/2020) . She has been finished with radiation treatments for a month. No fever, no chest pain, but some back pain that is intermittent. Last WBC was 3.9, HGB 12.3, platelets were 141. She has stared immune therapy with Imfinzi 1500 Mg IV every 4 weeks. First treatment was 2 weeks ago. She does not want to take antibiotics or prednisone as she is concerned this will interfere with her immunr therapy.    Pulmonary Embolism Wells Score Select Criteria:  Symptoms of DVT (3 points)>> 0  No alternative diagnosis better explains the illness (3 points) Yes, COPD Flare. Pneumonia, MAI, bronchiectasis  Tachycardia with pulse > 100 (1.5 points) + 1.5  Immobilization (>= 3 days) or surgery in the previous four weeks (1.5 points) No  Prior history of DVT or pulmonary embolism (1.5 points) No  Presence of hemoptysis (1 point) Not since bronchoscopy  Presence of malignancy (1 point) +1  Results: Total Criteria Point Count:   2.5 >> Moderate probability  Pulmonary Embolism Risk Score Interpretation Score > 6: High probability Score >= 2 and <= 6: Moderate probability Score < 2:  Low Probability   Echo 12/24/2020 Recent Echo with normal RV function   Oxygen saturation 98% today.  She denies post nasal drip.  She has not had to use her albuterol nebs    Test Results: CXR 01/18/2021 The heart size and mediastinal contours are within normal limits. The lungs remain hyperinflated with emphysematous changes. Partial collapse of the right middle lobe, best appreciated on the lateral view,  improved since recent CT. No focal consolidation, pleural effusion, or pneumothorax. No acute osseous abnormality. 1. Partial right middle lobe collapse, improved from recent CT. 2. COPD.  01/14/2021 Biopsy Results Specimen Submitted:  A. LUNG, RIGHT MAIN STEM, LAVAGE:    FINAL MICROSCOPIC DIAGNOSIS:  - Atypical cells present    FINAL MICROSCOPIC DIAGNOSIS:  - Suspicious for malignancy  - See comment   SPECIMEN ADEQUACY:  Satisfactory for evaluation   DIAGNOSTIC COMMENTS:  Findings appear suspicious for non-small cell carcinoma.  Dr. Saralyn Pilar  reviewed the case and concurs with the diagnosis.   BAL 01/14/2021 40,000 COLONIES/mL NEISSERIA SICCA  Standardized susceptibility testing for this organism is not available   CT Chest with contrast 12/31/2020 Lungs/Pleura: Centrilobular and paraseptal emphysema evident. Interval development of complete right middle lobe collapse. Marked bronchial wall thickening noted in the central airways of the right upper lobe with persistent impacted airway on image 60/7. No new suspicious pulmonary nodule or mass. No pleural effusion. Interval development of complete right middle lobe collapse with persistent stable impacted airway in the right upper lobe. Interval decrease in size of the subcarinal lymphadenopathy seen previously. Aortic Atherosclerosis      CBC Latest Ref Rng & Units 01/12/2021 12/31/2020 12/14/2020  WBC 4.0 - 10.5 K/uL 3.9(L) 2.2(L) 1.6(L)  Hemoglobin 12.0 - 15.0 g/dL 12.3 10.9(L) 11.4(L)  Hematocrit 36.0 - 46.0 % 36.3 32.2(L) 32.8(L)  Platelets 150 - 400 K/uL 141(L) 136(L) 98(L)    BMP Latest Ref Rng & Units 01/12/2021 12/31/2020 12/14/2020  Glucose 70 - 99 mg/dL 100(H) 93 93  BUN 8 - 23 mg/dL _0 Creatinine 0.44 - 1.00 mg/dL 0.76 0.77 0.70  BUN/Creat Ratio 12 - 28 - - -  Sodium 135 - 145 mmol/L 141 142 140  Potassium 3.5 - 5.1 mmol/L 4.0 4.3 4.2  Chloride 98 - 111 mmol/L 108 108 107  CO2 22 - 32 mmol/L _1 Calcium 8.9 - 10.3 mg/dL 9.2 9.1 9.1    BNP No results found for: BNP  ProBNP No results found for: PROBNP  PFT No results found for: FEV1PRE, FEV1POST, FVCPRE, FVCPOST, TLC, DLCOUNC, PREFEV1FVCRT, PSTFEV1FVCRT  DG Chest 2 View  Result Date: 01/18/2021 CLINICAL DATA:  Shortness of breath.  History of lung cancer. EXAM: CHEST - 2 VIEW COMPARISON:  CT chest dated December 31, 2020. Chest x-ray dated October 08, 2020. FINDINGS: The heart size and mediastinal contours are within normal limits. The lungs remain hyperinflated with emphysematous changes. Partial collapse of the right middle lobe, best appreciated on the lateral view, improved since recent CT. No focal consolidation, pleural effusion, or pneumothorax. No acute osseous abnormality. IMPRESSION: 1. Partial right middle lobe collapse, improved from recent CT. 2. COPD. Electronically Signed   By: Titus Dubin M.D.   On: 01/18/2021 17:09   CT Chest W Contrast  Addendum Date: 01/02/2021   ADDENDUM REPORT: 01/02/2021 12:14 ADDENDUM: As noted in the body of the report, ascending thoracic aorta measures 4.0 cm diameter. Continued attention on surveillance CT recommended. Electronically Signed   By: Randall Hiss  Tery Sanfilippo M.D.   On: 01/02/2021 12:14   Result Date: 01/02/2021 CLINICAL DATA:  Non-small-cell lung cancer.  Restaging. EXAM: CT CHEST WITH CONTRAST TECHNIQUE: Multidetector CT imaging of the chest was performed during intravenous contrast administration. CONTRAST:  67m OMNIPAQUE IOHEXOL 350 MG/ML SOLN COMPARISON:  PET-CT 09/29/2020.  Chest CT 09/21/2020. FINDINGS: Cardiovascular: The heart size is normal. No substantial pericardial effusion. Coronary artery calcification is evident. Moderate atherosclerotic calcification is noted in the wall of the thoracic aorta. Ascending thoracic aorta measures 4 cm diameter. Mediastinum/Nodes: Subcarinal node measured on 09/21/2020 at 1.8 cm short axis is now 0.9 cm short axis on 67/2. No other  mediastinal lymphadenopathy. There is no hilar lymphadenopathy. The esophagus has normal imaging features. There is no axillary lymphadenopathy. Lungs/Pleura: Centrilobular and paraseptal emphysema evident. Interval development of complete right middle lobe collapse. Marked bronchial wall thickening noted in the central airways of the right upper lobe with persistent impacted airway on image 60/7. No new suspicious pulmonary nodule or mass. No pleural effusion. Upper Abdomen: Tiny hypodensity in the posterior hepatic dome (126/2) is stable. Musculoskeletal: No worrisome lytic or sclerotic osseous abnormality. IMPRESSION: 1. Interval development of complete right middle lobe collapse with persistent stable impacted airway in the right upper lobe. 2. Interval decrease in size of the subcarinal lymphadenopathy seen previously. 3. Aortic Atherosclerosis (ICD10-I70.0) and Emphysema (ICD10-J43.9). Electronically Signed: By: EMisty StanleyM.D. On: 01/02/2021 11:07   ECHOCARDIOGRAM COMPLETE  Result Date: 12/25/2020    ECHOCARDIOGRAM REPORT   Patient Name:   CDalasia Predmore  Date of Exam: 12/24/2020 Medical Rec #:  [id number]    Height:       64.0 in Accession #:    [id number]   Weight:       128.1 lb Date of Birth:  609-12-1955     BSA:          1.619 m Patient Age:    89years      BP:           120/76 mmHg Patient Gender: F             HR:           92 bpm. [place] [facility]  [place] [place] [facility], 3D Echo, Cardiac Doppler, Color Doppler and Strain Analysis Indications:    I71.2 Thoracic Aortic Aneurysm  History:        Patient has prior history of Echocardiogram examinations, most                 recent 03/11/2020. CAD and Previous Myocardial Infarction, COPD;                 Risk Factors:Hypertension, Dyslipidemia, Family History of                 Coronary Artery Disease and Former Smoker. 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  Sonographer:    BDeliah BostonRDCS  Referring Phys: HGreer EePEMBERTON IMPRESSIONS  1. Left ventricular ejection fraction, by estimation, is 55 to 60%. Left ventricular ejection fraction by 3D volume is 62 %. The left ventricle has normal function. The left ventricle has no regional wall motion abnormalities. Left ventricular diastolic  parameters are indeterminate. The average left ventricular global longitudinal strain is -17.7 %. The global longitudinal strain is normal.  2. Right ventricular systolic function is normal. The  right ventricular size is normal. Tricuspid regurgitation signal is inadequate for assessing PA pressure.  3. Left atrial size was mildly dilated.  4. The mitral valve is normal in structure. Trivial mitral valve regurgitation. No evidence of mitral stenosis.  5. The aortic valve is tricuspid. Aortic valve regurgitation is trivial. No aortic stenosis is present.  6. Aortic dilatation noted. There is mild dilatation of the ascending aorta, measuring 40 mm.  7. The inferior vena cava is normal in size with greater than 50% respiratory variability, suggesting right atrial pressure of 3 mmHg. Comparison(s): No significant change from prior study. Conclusion(s)/Recommendation(s): Stable TAA (prior study 41 mm, current study 40 mm). FINDINGS  Left Ventricle: Left ventricular ejection fraction, by estimation, is 55 to 60%. Left ventricular ejection fraction by 3D volume is 62 %. The left ventricle has normal function. The left ventricle has no regional wall motion abnormalities. The average left ventricular global longitudinal strain is -17.7 %. The global longitudinal strain is normal. The left ventricular internal cavity size was normal in size. There is no left ventricular hypertrophy. Left ventricular diastolic parameters are indeterminate. Right Ventricle: The right ventricular size is normal. No increase in right ventricular wall thickness. Right ventricular systolic function is normal. Tricuspid regurgitation signal is  inadequate for assessing PA pressure. Left Atrium: Left atrial size was mildly dilated. Right Atrium: Right atrial size was normal in size. Pericardium: There is no evidence of pericardial effusion. Presence of pericardial fat pad. Mitral Valve: The mitral valve is normal in structure. Trivial mitral valve regurgitation. No evidence of mitral valve stenosis. Tricuspid Valve: The tricuspid valve is normal in structure. Tricuspid valve regurgitation is not demonstrated. No evidence of tricuspid stenosis. Aortic Valve: The aortic valve is tricuspid. Aortic valve regurgitation is trivial. No aortic stenosis is present. Pulmonic Valve: The pulmonic valve was grossly normal. Pulmonic valve regurgitation is trivial. No evidence of pulmonic stenosis. Aorta: Aortic dilatation noted. There is mild dilatation of the ascending aorta, measuring 40 mm. Venous: The inferior vena cava is normal in size with greater than 50% respiratory variability, suggesting right atrial pressure of 3 mmHg. IAS/Shunts: The atrial septum is grossly normal.  LEFT VENTRICLE PLAX 2D LVIDd:         4.20 cm         Diastology LVIDs:         2.80 cm         LV e' medial:    6.36 cm/s LV PW:         0.60 cm         LV E/e' medial:  16.1 LV IVS:        0.70 cm         LV e' lateral:   9.32 cm/s LVOT diam:     2.30 cm         LV E/e' lateral: 11.0 LV SV:         59 LV SV Index:   36              2D LVOT Area:     4.15 cm        Longitudinal                                Strain  2D Strain GLS  -13.6 %                                (A2C):                                2D Strain GLS  -17.2 %                                (A3C):                                2D Strain GLS  -22.3 %                                (A4C):                                2D Strain GLS  -17.7 %                                Avg:                                 3D Volume EF                                LV 3D EF:    Left                                              ventricul                                             ar                                             ejection                                             fraction                                             by 3D                                             volume is  62 %.                                 3D Volume EF:                                3D EF:        62 %                                LV EDV:       104 ml                                LV ESV:       39 ml                                LV SV:        65 ml RIGHT VENTRICLE RV S prime:     12.90 cm/s TAPSE (M-mode): 1.8 cm LEFT ATRIUM           Index       RIGHT ATRIUM           Index LA diam:      2.80 cm 1.73 cm/m  RA Area:     11.70 cm LA Vol (A2C): 43.6 ml 26.93 ml/m RA Volume:   25.00 ml  15.44 ml/m LA Vol (A4C): 30.3 ml 18.72 ml/m  AORTIC VALVE LVOT Vmax:   85.65 cm/s LVOT Vmean:  57.650 cm/s LVOT VTI:    0.142 m  AORTA Ao Root diam: 3.50 cm Ao Asc diam:  3.95 cm MITRAL VALVE MV Area (PHT)  cm          SHUNTS MV Decel Time: 173 msec     Systemic VTI:  0.14 m MV E velocity: 102.55 cm/s  Systemic Diam: 2.30 cm MV A velocity: 113.00 cm/s MV E/A ratio:  0.91 Buford Dresser MD Electronically signed by Buford Dresser MD Signature Date/Time: 12/25/2020/11:29:19 PM    Final      Past medical hx Past Medical History:  Diagnosis Date   Anxiety    COPD (chronic obstructive pulmonary disease) (HCC)    Coronary artery disease    Dental staining 07/15/2019   Depression    History of radiation therapy    Right lung- 11/03/20-12/15/20- Dr. 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 Her smoking use included cigarettes.  She has a 77.00 pack-year smoking history. She has never used smokeless tobacco. She reports that she does not currently use alcohol after a past usage of about 24.0 standard drinks per week. She reports that she does not currently use drugs.  Tobacco Cessation: Former smoker, quit 2014 with a 77 pack year smoking history  Past surgical hx, Family hx, Social hx all reviewed.  Current Outpatient Medications on File Prior to Visit  Medication Sig   acetaminophen (TYLENOL) 500 MG tablet Take 500 mg by mouth every 6 (six) hours as needed for moderate pain or headache.   albuterol (PROAIR HFA) 108 (90 Base) MCG/ACT inhaler Inhale 2 puffs into the lungs every 6 (six) hours as needed for wheezing or shortness of breath.   albuterol (PROVENTIL) (2.5 MG/3ML) 0.083% nebulizer solution Take 3 mLs (2.5 mg total) by nebulization every 6 (six) hours as needed for wheezing or shortness of breath. (Patient taking differently: Take 2.5 mg by nebulization 2 (two) times daily.)   aspirin EC 81 MG tablet Take 81 mg by mouth daily with lunch.    benzonatate (TESSALON) 100 MG capsule Take 2 capsules (200 mg total) by mouth 3 (three) times daily as needed for cough.   buPROPion (WELLBUTRIN XL) 300 MG 24 hr tablet Take 1 tablet (300 mg total) by mouth daily.   Cholecalciferol (DIALYVITE VITAMIN D 5000) 125 MCG (5000 UT) capsule Take 5,000 Units by mouth daily.   esomeprazole (NEXIUM) 20 MG capsule Take 1 capsule (20 mg total) by mouth daily at 12 noon. 1 hour prior to meal (Patient taking differently: Take 20 mg by mouth daily.)   fluticasone (FLONASE) 50 MCG/ACT  nasal spray Place 1 spray into both nostrils as needed for allergies or rhinitis.   Guaifenesin (MUCINEX MAXIMUM STRENGTH) 1200 MG TB12 Take 1,200 mg by mouth daily.   HYDROcodone bit-homatropine (HYCODAN) 5-1.5 MG/5ML syrup Take 5 mLs by mouth every 6 (six) hours as needed for cough.   Magnesium 500 MG TABS Take 500 mg by mouth daily with lunch.   Multiple Vitamins-Minerals (IMMUNE SUPPORT PO) Take 1 tablet by mouth daily.   prochlorperazine (COMPAZINE) 10 MG tablet Take 1 tablet (10 mg total) by mouth every 6 (six) hours as needed for nausea or vomiting.   Respiratory Therapy Supplies (FLUTTER) DEVI Use as directed   rosuvastatin (CRESTOR) 10 MG tablet Take 10 mg by mouth daily.   rosuvastatin (CRESTOR) 20 MG tablet Take 1 tablet (20 mg total) by mouth daily.   sucralfate (CARAFATE) 1 g tablet Take 1 tablet (1 g total) by mouth 4 (four) times daily -  with meals and at bedtime. Crush and dissolve in 10 mL of warm water prior to swallowing (Patient taking differently: Take 1 g by mouth daily. Crush and dissolve in 10 mL of warm water prior to swallowing)   temazepam (RESTORIL) 15 MG capsule TAKE 1 CAPSULE AT BEDTIME AS NEEDED FOR SLEEP (NEED OFFICE VISIT) (Patient taking differently: Take 15 mg by mouth at bedtime.)   umeclidinium-vilanterol (ANORO ELLIPTA) 62.5-25 MCG/INH AEPB Inhale 1 puff into the lungs daily.   vitamin B-12 (CYANOCOBALAMIN) 1000 MCG tablet Take 1,000 mcg by mouth daily.   Current Facility-Administered Medications on File Prior to Visit  Medication   Sonafine emulsion 1 application     Allergies  Allergen Reactions   Benadryl [Diphenhydramine] Nausea Only   Codeine     Causes pt to blackout    Review Of Systems:  Constitutional:   No  weight  loss, night sweats,  Fevers, chills, + fatigue, or  lassitude.  HEENT:   No headaches,  Difficulty swallowing,  Tooth/dental problems, or  Sore throat,                No sneezing, itching, ear ache, nasal congestion, post nasal  drip,   CV:  No chest pain,  Orthopnea, PND, swelling in lower extremities, anasarca, dizziness, palpitations, syncope.   GI  No heartburn, indigestion, abdominal pain, nausea, vomiting, diarrhea, change in bowel habits, loss of appetite, bloody stools.   Resp: + shortness of breath with cough,   No excess mucus, no productive cough,  + non-productive cough,  No coughing up of blood.  No change in color of mucus.  + wheezing.  No chest wall deformity  Skin: no rash or lesions.  GU: no dysuria, change in color of urine, no urgency or frequency.  No flank pain, no hematuria   MS:  No joint pain or swelling.  No decreased range of motion.  + intermittent  back pain.  Psych:  + change in mood or affect. + depression or anxiety.  No memory loss.   Vital Signs BP 136/80 (BP Location: Left Arm, Patient Position: Sitting, Cuff Size: Normal)   Pulse 98   Temp 98.9 F (37.2 C) (Oral)   Ht _0  (1.626 m)   Wt 128 lb 12.8 oz (58.4 kg)   SpO2 98%   BMI 22.11 kg/m    Physical Exam:  General- No distress,  A&O x 3, anxious, pleasant ENT: No sinus tenderness, TM clear, pale nasal mucosa, no oral exudate,no post nasal drip, no LAN Cardiac: S1, S2, regular rate and rhythm, no murmur Chest: No wheeze/ rales/ dullness; no accessory muscle use, no nasal flaring, no sternal retractions Abd.: Soft Non-tender, ND, BS +, Body mass index is 22.11 kg/m.  Ext: No clubbing cyanosis, edema Neuro:  normal strength, MAE x 4, A&O x 3 Skin: No rashes, warm and dry, No lesions Psych: normal mood and behavior   Assessment/Plan NSCLC with recurrence per biopsy 01/14/2021 Post chemoradiation  Initiation of immunotherapy Pt does not want to take antibiotics or prednisone Plan CXR today  Tx. Per Dr. Julien Nordmann. Follow up with Dr. Valeta Harms 02/10/2021 as is scheduled.   Worsening Cough/ GERD Plan Sips of water instead of throat clearing Sugar Free [employer] or [employer] originals for throat  soothing. Delsym Cough syrup 5 cc's every 12 hours Conitnue Nexium as you have been doing Add Pepcid 20 mg at bedtime ( famotidine) Continue sleeping propped up.  BAL result with Neisseria SICCA.  Plan Could be colonization  While on immune therapy avoid ABX and prednisone Consider ID appt. once immunotherapy complete with careful observation   Bronchiectasis Partial RML collapse slightly improved with most recent CXR Plan Continue chest vest  twice daily,  Mucinex twice daily  and Flutter Valve. Continue your rescue nebs as needed   I spent 40 minutes dedicated to the care of this patient on the date of this encounter to include pre-visit review of records, face-to-face time with the patient discussing conditions above, post visit ordering of testing, clinical documentation with the electronic health record, making appropriate referrals as documented, and communicating necessary information to the patient's healthcare team.      Magdalen Spatz, NP 01/18/2021  5:28 PM

## 2021-01-18 NOTE — Telephone Encounter (Signed)
CALLED PATIENT TO ASK WHEN THAT SHE WOULD LIKE TO COME FOR FU, PATIENT AGREED TO COME ON 01-25-21 @ 10:15 AM

## 2021-01-18 NOTE — Patient Instructions (Addendum)
It is good to see you today. We will order a CXR. We will call you with the CXR results Sips of water instead of throat clearing Sugar Free [employer] or [employer] originals for throat soothing. Delsym Cough syrup 5 cc's every 12 hours Conitnue Nexium as you have been doing Add Pepcid 20 mg at bedtime ( famotidine) Continue sleeping propped up. Continue chest vest  twice daily,  Mucinex twice daily  and Flutter Valve. Continue your rescue nebs as needed. Follow up 02/10/2021 with Dr. Valeta Harms Call if you need [place] sooner.  Please contact office for sooner follow up if symptoms do not improve or worsen or seek emergency care

## 2021-01-18 NOTE — Telephone Encounter (Signed)
I called the patient with the results of her main stem biopsy done 01/14/2021 by Dr. Valeta Harms. . I explained that the right main stem brushing collected 10/13 was identified as a non-cell carcinoma, which appears to be recurrence of her previously diagnosed cancer. ( 09/2020)  She was seen by Dr. Julien Nordmann 01/07/2021, she has undergone a course of concurrent chemoradiation with weekly carboplatin for AUC of 2 and paclitaxel 45 Mg/M2 status post 7 cycles.Repeat scanning showed  improvement of the subcarinal lymph node that was the original presentation of her disease.  It also showed collapse of the right middle lobe that could be secondary to scarring from radiation, mucous plugging or central lung mass.For the right middle lobe collapse, she was referred back to  Dr. Valeta Harms for repeat bronchoscopy and evaluation for any central obstructing lesion or mucous plugging. The brushing of the right mainstem was identified as a non-small cell carcinoma.  Dr. Worthy Flank plan was  to proceed with the consolidation treatment with immunotherapy with Imfinzi 1500 Mg IV every 4 weeks.  She was interested in proceeding with the treatment and expected to start the first cycle of this treatment next week.  When I called to review her pathology with her, she was complaining of a worsening dyspnea and cough. No hemoptysis. Additionally her BAL was + for  40,000 colonies of neisseria SICCA.  I have scheduled her for an appointment today at 4 pm in the office with stat CXR prior . While this is a colonizing organism, I want to evaluate her , as I have a low threshold for treatment with her recent chemotherapy, and immunocompromised status.   Dr. Julien Nordmann and Icard, I wanted you to both be aware of the above.

## 2021-01-19 LAB — ANAEROBIC CULTURE W GRAM STAIN

## 2021-01-19 NOTE — Progress Notes (Signed)
These results were called to the patient 10/17 after her appointment . She verbalized understanding

## 2021-01-21 ENCOUNTER — Ambulatory Visit: Payer: Self-pay | Admitting: Radiation Oncology

## 2021-01-22 DIAGNOSIS — R918 Other nonspecific abnormal finding of lung field: Secondary | ICD-10-CM | POA: Insufficient documentation

## 2021-01-22 NOTE — Progress Notes (Signed)
Radiation Oncology         [phone] ________________________________  Name: Katrina Campbell MRN: [id number]  Date: 01/25/2021  DOB: 1955/04/26  Follow-Up Visit Note  CC: Katrina Campbell, Katrina Rummage, MD    ICD-10-CM   1. 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 Stage IIIa ([place], N2, M0) non-small cell lung cancer of the right lung, squamous cell carcinoma presented with subcarinal mass/lymphadenopathy diagnosed in July 2022.  Interval Since Last Radiation: 1 month and 11 days    Intent: Curative  Radiation Treatment Dates: 11/03/2020 through 12/15/2020 Site Technique Total Dose (Gy) Dose per Fx (Gy) Completed Fx Beam Energies  Lung, Right: Lung_Rt 3D 60/60 2 30/30 6X, 15X    Narrative:  The patient returns today for routine follow-up. She tolerated her radiation treatments relatively well with the exception of fatigue, cough, esophagitis, and pain with swallowing despite sucralfate and Nexium prescriptions. She also reported new throbbing bilateral side pain starting on the morning of her final treatment.     Since her final treatment, the patient followed up with Dr. Julien Nordmann on 12/14/20. During which time, the patient reported ongoing cough defined by laser sharp pain when she swallows. The patient was discontinued on cycle 7 of her treatment during this visit due to CBC showing neutropenia.   Chest CT performed on 12/31/20 demonstrated interval development of complete right middle lobe collapse with persistent stable impacted airway in the right upper lobe. Also seen was the interval decrease in size of the subcarinal lymphadenopathy seen previously.  Due to development of right middle lobe collapse, the patient underwent bronchoscopy and biopsies on 01/14/21 under the care of Dr. Valeta Harms. Bronchoscopy revealed the right middle lobe as obstructed with thick tenacious secretions.  Saline was  used for irrigation and removal and therapeutic aspiration of the bilateral mainstem's, which removed all remaining blood clots, debris, and tenacious secretions.  Biopsy of the right main stem revealed metaplastic squamous mucosa with ulceration and atypia, and no evidence of invasive processes. Cytology from right men stem brushing revealed findings suspicious for malignancy. Diagnostic comment further noted findings as suspicious for non-small cell carcinoma.  Chest x-ray on 01/18/21 revealed improvement of the partial right middle lobe collapse compared to recent CT.   She continues to be bothered by a spasm type cough at night which will keep her awake.  Her general cough during the day has improved significantly.  She denies any odynophagia or dysphagia at this point.  She denies any significant breathing issues other than that associated with his cough at night.  She has started immunotherapy and continue tolerating this well.                           Allergies:  is allergic to benadryl [diphenhydramine] and codeine.  Meds: Current Outpatient Medications  Medication Sig Dispense Refill   acetaminophen (TYLENOL) 500 MG tablet Take 500 mg by mouth every 6 (six) hours as needed for moderate pain or headache.     albuterol (PROAIR HFA) 108 (90 Base) MCG/ACT inhaler Inhale 2 puffs into the lungs every 6 (six) hours as needed for wheezing or shortness of breath. 24 g 0   albuterol (PROVENTIL) (2.5 MG/3ML) 0.083% nebulizer solution Take 3 mLs (2.5 mg total) by nebulization every 6 (six) hours as needed for wheezing or shortness of breath. (Patient taking  differently: Take 2.5 mg by nebulization 2 (two) times daily.) 540 mL 3   aspirin EC 81 MG tablet Take 81 mg by mouth daily with lunch.      buPROPion (WELLBUTRIN XL) 300 MG 24 hr tablet Take 1 tablet (300 mg total) by mouth daily. 90 tablet 3   Cholecalciferol (DIALYVITE VITAMIN D 5000) 125 MCG (5000 UT) capsule Take 5,000 Units by mouth daily.      esomeprazole (NEXIUM) 20 MG capsule Take 1 capsule (20 mg total) by mouth daily at 12 noon. 1 hour prior to meal (Patient taking differently: Take 20 mg by mouth daily.) 30 capsule 1   fluticasone (FLONASE) 50 MCG/ACT nasal spray Place 1 spray into both nostrils as needed for allergies or rhinitis.     Guaifenesin (MUCINEX MAXIMUM STRENGTH) 1200 MG TB12 Take 1,200 mg by mouth daily.     Magnesium 500 MG TABS Take 500 mg by mouth daily with lunch.     Multiple Vitamins-Minerals (IMMUNE SUPPORT PO) Take 1 tablet by mouth daily.     prochlorperazine (COMPAZINE) 10 MG tablet Take 1 tablet (10 mg total) by mouth every 6 (six) hours as needed for nausea or vomiting. 30 tablet 0   Respiratory Therapy Supplies (FLUTTER) DEVI Use as directed 1 each 0   rosuvastatin (CRESTOR) 10 MG tablet Take 10 mg by mouth daily.     sucralfate (CARAFATE) 1 g tablet Take 1 tablet (1 g total) by mouth 4 (four) times daily -  with meals and at bedtime. Crush and dissolve in 10 mL of warm water prior to swallowing (Patient taking differently: Take 1 g by mouth daily. Crush and dissolve in 10 mL of warm water prior to swallowing) 120 tablet 1   temazepam (RESTORIL) 15 MG capsule TAKE 1 CAPSULE AT BEDTIME AS NEEDED FOR SLEEP (NEED OFFICE VISIT) (Patient taking differently: Take 15 mg by mouth at bedtime.) 90 capsule 0   umeclidinium-vilanterol (ANORO ELLIPTA) 62.5-25 MCG/INH AEPB Inhale 1 puff into the lungs daily. 180 each 3   vitamin B-12 (CYANOCOBALAMIN) 1000 MCG tablet Take 1,000 mcg by mouth daily.     benzonatate (TESSALON) 100 MG capsule Take 2 capsules (200 mg total) by mouth 3 (three) times daily as needed for cough. (Patient not taking: Reported on 01/25/2021) 30 capsule 1   HYDROcodone bit-homatropine (HYCODAN) 5-1.5 MG/5ML syrup Take 5 mLs by mouth every 6 (six) hours as needed for cough. (Patient not taking: Reported on 01/25/2021) 120 mL 0   rosuvastatin (CRESTOR) 20 MG tablet Take 1 tablet (20 mg total) by mouth  daily. (Patient not taking: Reported on 01/25/2021) 90 tablet 3   No current facility-administered medications for this encounter.   Facility-Administered Medications Ordered in Other Encounters  Medication Dose Route Frequency Provider Last Rate Last Admin   Sonafine emulsion 1 application  1 application Topical Once Gery Pray, MD        Physical Findings: The patient is in no acute distress. Patient is alert and oriented.  height is 5\' 4"  (1.626 m) and weight is 130 lb 9.6 oz (59.2 kg). Her temperature is 97.7 F (36.5 C). Her blood pressure is 129/84 and her pulse is 95. Her respiration is 20 and oxygen saturation is 97%. . Lungs are clear to auscultation bilaterally except for some mildly decreased breath sounds in the right anterior middle lung field. Heart has regular rate and rhythm. No palpable cervical, supraclavicular, or axillary adenopathy. Abdomen soft, non-tender, normal bowel sounds.    Lab  Findings: Lab Results  Component Value Date   WBC 3.9 (L) 01/12/2021   HGB 12.3 01/12/2021   HCT 36.3 01/12/2021   MCV 90.8 01/12/2021   PLT 141 (L) 01/12/2021    Radiographic Findings: DG Chest 2 View  Result Date: 01/18/2021 CLINICAL DATA:  Shortness of breath.  History of lung cancer. EXAM: CHEST - 2 VIEW COMPARISON:  CT chest dated December 31, 2020. Chest x-ray dated October 08, 2020. FINDINGS: The heart size and mediastinal contours are within normal limits. The lungs remain hyperinflated with emphysematous changes. Partial collapse of the right middle lobe, best appreciated on the lateral view, improved since recent CT. No focal consolidation, pleural effusion, or pneumothorax. No acute osseous abnormality. IMPRESSION: 1. Partial right middle lobe collapse, improved from recent CT. 2. COPD. Electronically Signed   By: Titus Dubin M.D.   On: 01/18/2021 17:09   CT Chest W Contrast  Addendum Date: 01/02/2021   ADDENDUM REPORT: 01/02/2021 12:14 ADDENDUM: As noted in the body  of the report, ascending thoracic aorta measures 4.0 cm diameter. Continued attention on surveillance CT recommended. Electronically Signed   By: Misty Stanley M.D.   On: 01/02/2021 12:14   Result Date: 01/02/2021 CLINICAL DATA:  Non-small-cell lung cancer.  Restaging. EXAM: CT CHEST WITH CONTRAST TECHNIQUE: Multidetector CT imaging of the chest was performed during intravenous contrast administration. CONTRAST:  79mL OMNIPAQUE IOHEXOL 350 MG/ML SOLN COMPARISON:  PET-CT 09/29/2020.  Chest CT 09/21/2020. FINDINGS: Cardiovascular: The heart size is normal. No substantial pericardial effusion. Coronary artery calcification is evident. Moderate atherosclerotic calcification is noted in the wall of the thoracic aorta. Ascending thoracic aorta measures 4 cm diameter. Mediastinum/Nodes: Subcarinal node measured on 09/21/2020 at 1.8 cm short axis is now 0.9 cm short axis on 67/2. No other mediastinal lymphadenopathy. There is no hilar lymphadenopathy. The esophagus has normal imaging features. There is no axillary lymphadenopathy. Lungs/Pleura: Centrilobular and paraseptal emphysema evident. Interval development of complete right middle lobe collapse. Marked bronchial wall thickening noted in the central airways of the right upper lobe with persistent impacted airway on image 60/7. No new suspicious pulmonary nodule or mass. No pleural effusion. Upper Abdomen: Tiny hypodensity in the posterior hepatic dome (126/2) is stable. Musculoskeletal: No worrisome lytic or sclerotic osseous abnormality. IMPRESSION: 1. Interval development of complete right middle lobe collapse with persistent stable impacted airway in the right upper lobe. 2. Interval decrease in size of the subcarinal lymphadenopathy seen previously. 3. Aortic Atherosclerosis (ICD10-I70.0) and Emphysema (ICD10-J43.9). Electronically Signed: By: Misty Stanley M.D. On: 01/02/2021 11:07    Impression:  The encounter diagnosis was Stage III squamous cell carcinoma  of right lung ([place]). Stage IIIa ([place], N2, M0) non-small cell lung cancer of the right lung, squamous cell carcinoma presented with subcarinal mass/lymphadenopathy diagnosed in July 2022.  The patient is recovering from the effects of radiation.  Chest CT scan findings as above.  This is related to mucous plugging and clots.  Chest x-ray at a later date shows improvement of right middle lobe collapse.  Recommend she discuss ongoing coughing with pulmonology.  She is using a humidifier at night tried some cough drops in addition to her cough medication.  Plan: As needed follow-up in radiation oncology.  Patient will continue on immunotherapy as above.    ____________________________________  Blair Promise, PhD, MD   This document serves as a record of services personally performed by Gery Pray, MD. It was created on his behalf by Roney Mans, a trained medical  scribe. The creation of this record is based on the scribe's personal observations and the provider's statements to them. This document has been checked and approved by the attending provider.

## 2021-01-22 NOTE — Progress Notes (Incomplete)
  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 Stage IIIa ([place], N2, M0) non-small cell lung cancer of the right lung, squamous cell carcinoma presented with subcarinal mass/lymphadenopathy diagnosed in July 2022.  Intent: Curative  Radiation Treatment Dates: 11/03/2020 through 12/15/2020 Site Technique Total Dose (Gy) Dose per Fx (Gy) Completed Fx Beam Energies  Lung, Right: Lung_Rt 3D 60/60 2 30/30 6X, 15X   Narrative: The patient tolerated radiation therapy relatively well. Weight and vitals are stable, but patient reports feeling more fatigued. She continues to experience esophagitis and pain with swallowing despite sucralfate and Nexium prescriptions. Patient reports feeling as though her cough has increased in sharpness and frequency (no longer relieved at night by Robitussin). She denies any skin issues or concerns in treatment field.   She was not able to receive final cycle of chemotherapy due to low blood counts, but reports she will have a CT scan w/ contrast and then F/U with Dr. Julien Nordmann in 2-3 weeks. Patient also reports new bilateral side pain (describes as throbbing) that started this morning. 1 month F/U card provided   Plan: The patient will follow-up with radiation oncology in one month. We will give her Ladona Ridgel to see if this will help with her cough ________________________________________________ -----------------------------------  Blair Promise, PhD, MD  This document serves as a record of services personally performed by Gery Pray, MD. It was created on his  behalf by Roney Mans, a trained medical scribe. The creation of this record is based on the scribe's personal observations and the provider's statements to them. This document has been checked and approved by the attending provider.

## 2021-01-25 ENCOUNTER — Ambulatory Visit
Admission: RE | Admit: 2021-01-25 | Discharge: 2021-01-25 | Disposition: A | Discharge status: Home / Self Care | Payer: Medicare Other | Source: Ambulatory Visit | Attending: Radiation Oncology | Admitting: Radiation Oncology

## 2021-01-25 ENCOUNTER — Other Ambulatory Visit: Payer: Self-pay

## 2021-01-25 ENCOUNTER — Encounter: Payer: Self-pay | Admitting: Radiation Oncology

## 2021-01-25 VITALS — BP 129/84 | HR 95 | Temp 97.7°F | Resp 20 | Ht 64.0 in | Wt 130.6 lb

## 2021-01-25 DIAGNOSIS — J9819 Other pulmonary collapse: Secondary | ICD-10-CM | POA: Insufficient documentation

## 2021-01-25 DIAGNOSIS — I700 Atherosclerosis of aorta: Secondary | ICD-10-CM | POA: Insufficient documentation

## 2021-01-25 DIAGNOSIS — J439 Emphysema, unspecified: Secondary | ICD-10-CM | POA: Diagnosis not present

## 2021-01-25 DIAGNOSIS — Z923 Personal history of irradiation: Secondary | ICD-10-CM | POA: Insufficient documentation

## 2021-01-25 DIAGNOSIS — C3401 Malignant neoplasm of right main bronchus: Secondary | ICD-10-CM | POA: Insufficient documentation

## 2021-01-25 DIAGNOSIS — R918 Other nonspecific abnormal finding of lung field: Secondary | ICD-10-CM

## 2021-01-25 DIAGNOSIS — C3491 Malignant neoplasm of unspecified part of right bronchus or lung: Secondary | ICD-10-CM

## 2021-01-25 MED ORDER — SONAFINE EX EMUL
1.0000 "application " | Freq: Once | CUTANEOUS | Status: AC
  Administered 2021-01-25: 1 via TOPICAL

## 2021-01-25 NOTE — Progress Notes (Signed)
Katrina Campbell is here today for follow up post radiation to the lung.  Lung Side: Right, patient completed treatment on 12/15/20.  Does the patient complain of any of the following: Pain:Patient denies pain.  Shortness of breath w/wo exertion: yes Cough: Patient reports having dry cough, that causing shortness of breath. Patient reports having a bronchial washing on 01/14/21, reports no major improvements after procedure. Patient reports having cough at night that is interfering with her sleep. Reports using several pillows to prop her self up and or sleeping in a recliner to assist with coughing and shortness of breath .  Hemoptysis: no Pain with swallowing: No Swallowing/choking concerns: no Appetite: Good Energy Level: Patient reports improvement with energy level.  Post radiation skin Changes:Reports having some dry areas to back.      Additional comments if applicable:  Vitals:   92/11/94 0954  BP: 129/84  Pulse: 95  Resp: 20  Temp: 97.7 F (36.5 C)  SpO2: 97%  Weight: 130 lb 9.6 oz (59.2 kg)  Height: 5\' 4"  (1.626 m)

## 2021-02-05 ENCOUNTER — Other Ambulatory Visit: Payer: Self-pay | Admitting: Medical

## 2021-02-09 ENCOUNTER — Inpatient Hospital Stay: Payer: Medicare Other | Attending: Physician Assistant

## 2021-02-09 ENCOUNTER — Other Ambulatory Visit: Payer: Self-pay

## 2021-02-09 ENCOUNTER — Ambulatory Visit (HOSPITAL_COMMUNITY)
Admission: RE | Admit: 2021-02-09 | Discharge: 2021-02-09 | Disposition: A | Discharge status: Home / Self Care | Payer: Medicare Other | Source: Ambulatory Visit | Attending: Internal Medicine | Admitting: Internal Medicine

## 2021-02-09 ENCOUNTER — Encounter: Payer: Self-pay | Admitting: Internal Medicine

## 2021-02-09 ENCOUNTER — Inpatient Hospital Stay (HOSPITAL_BASED_OUTPATIENT_CLINIC_OR_DEPARTMENT_OTHER): Payer: Medicare Other | Admitting: Internal Medicine

## 2021-02-09 ENCOUNTER — Inpatient Hospital Stay: Payer: Medicare Other

## 2021-02-09 ENCOUNTER — Other Ambulatory Visit: Payer: Self-pay | Admitting: Medical Oncology

## 2021-02-09 VITALS — BP 142/82 | HR 92 | Temp 99.6°F | Resp 20 | Ht 64.0 in | Wt 130.6 lb

## 2021-02-09 DIAGNOSIS — C3491 Malignant neoplasm of unspecified part of right bronchus or lung: Secondary | ICD-10-CM | POA: Diagnosis present

## 2021-02-09 DIAGNOSIS — R0682 Tachypnea, not elsewhere classified: Secondary | ICD-10-CM | POA: Insufficient documentation

## 2021-02-09 DIAGNOSIS — J449 Chronic obstructive pulmonary disease, unspecified: Secondary | ICD-10-CM | POA: Insufficient documentation

## 2021-02-09 DIAGNOSIS — Z79899 Other long term (current) drug therapy: Secondary | ICD-10-CM | POA: Insufficient documentation

## 2021-02-09 DIAGNOSIS — R5382 Chronic fatigue, unspecified: Secondary | ICD-10-CM | POA: Diagnosis not present

## 2021-02-09 DIAGNOSIS — Z5112 Encounter for antineoplastic immunotherapy: Secondary | ICD-10-CM

## 2021-02-09 LAB — CMP (CANCER CENTER ONLY)
ALT: 28 U/L (ref 0–44)
AST: 26 U/L (ref 15–41)
Albumin: 3.7 g/dL (ref 3.5–5.0)
Alkaline Phosphatase: 103 U/L (ref 38–126)
Anion gap: 9 (ref 5–15)
BUN: 13 mg/dL (ref 8–23)
CO2: 24 mmol/L (ref 22–32)
Calcium: 8.8 mg/dL — ABNORMAL LOW (ref 8.9–10.3)
Chloride: 107 mmol/L (ref 98–111)
Creatinine: 0.74 mg/dL (ref 0.44–1.00)
GFR, Estimated: >60 mL/min (ref >60)
Glucose, Bld: 101 mg/dL — ABNORMAL HIGH (ref 70–99)
Potassium: 4 mmol/L (ref 3.5–5.1)
Sodium: 140 mmol/L (ref 135–145)
Total Bilirubin: 0.3 mg/dL (ref 0.3–1.2)
Total Protein: 6.6 g/dL (ref 6.5–8.1)

## 2021-02-09 LAB — CBC WITH DIFFERENTIAL (CANCER CENTER ONLY)
Abs Immature Granulocytes: 0.01 10*3/uL (ref 0.00–0.07)
Basophils Absolute: 0 10*3/uL (ref 0.0–0.1)
Basophils Relative: 1 %
Eosinophils Absolute: 0.1 10*3/uL (ref 0.0–0.5)
Eosinophils Relative: 2 %
HCT: 35.6 % — ABNORMAL LOW (ref 36.0–46.0)
Hemoglobin: 12.1 g/dL (ref 12.0–15.0)
Immature Granulocytes: 0 %
Lymphocytes Relative: 17 %
Lymphs Abs: 0.8 10*3/uL (ref 0.7–4.0)
MCH: 30 pg (ref 26.0–34.0)
MCHC: 34 g/dL (ref 30.0–36.0)
MCV: 88.1 fL (ref 80.0–100.0)
Monocytes Absolute: 0.4 10*3/uL (ref 0.1–1.0)
Monocytes Relative: 10 %
Neutro Abs: 3.1 10*3/uL (ref 1.7–7.7)
Neutrophils Relative %: 70 %
Platelet Count: 105 10*3/uL — ABNORMAL LOW (ref 150–400)
RBC: 4.04 MIL/uL (ref 3.87–5.11)
RDW: 13.1 % (ref 11.5–15.5)
WBC Count: 4.4 10*3/uL (ref 4.0–10.5)
nRBC: 0 % (ref 0.0–0.2)

## 2021-02-09 LAB — TSH: TSH: 2.582 u[IU]/mL (ref 0.308–3.960)

## 2021-02-09 MED ORDER — SODIUM CHLORIDE 0.9 % IV SOLN: Freq: Once | INTRAVENOUS | Status: AC

## 2021-02-09 MED ORDER — SODIUM CHLORIDE 0.9 % IV SOLN
1500.0000 mg | Freq: Once | INTRAVENOUS | Status: AC
  Administered 2021-02-09: 1500 mg via INTRAVENOUS
  Filled 2021-02-09: qty 30

## 2021-02-09 NOTE — Progress Notes (Signed)
[place] [phone]   [phone]  OFFICE PROGRESS NOTE  Carlena Hurl, PA-C [address] [place] [address]  DIAGNOSIS: Stage IIIA (TX, N2, M0) non-small cell lung cancer, squamous cell carcinoma presented with subcarinal mass/lymphadenopathy diagnosed in July 2022.   PRIOR THERAPY: Weekly carboplatin for AUC of 2 and paclitaxel 45 mg/m2.  First dose expected on 11/02/2020.  Status post 7 cycles.  Last dose was given 12/14/2020   CURRENT THERAPY: Consolidation treatment with immunotherapy with Imfinzi 1500 Mg IV every 4 weeks.  First dose 01/14/2021.  Status post 1 cycle  INTERVAL HISTORY: Katrina Campbell 65 y.o. female returns to the clinic today for follow-up visit.  The patient is feeling fine today with no concerning complaints except for the shortness of breath at baseline increased with exertion she is followed by Dr. Valeta Harms.  She had repeat bronchoscopy few weeks ago for evaluation of the obstructive right middle lobe bronchus and that showed malignant cells.  She denied having any chest pain, cough or hemoptysis.  She denied having any fever or chills.  She has no nausea, vomiting, diarrhea or constipation.  She has no headache or visual changes.  She is here today for evaluation before starting cycle #2 of her treatment.  MEDICAL HISTORY: Past Medical History:  Diagnosis Date   Anxiety    COPD (chronic obstructive pulmonary disease) ([place])    Coronary artery disease    Dental staining 07/15/2019   Depression    History of radiation therapy    Right lung- 11/03/20-12/15/20- Dr. 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  MEDICATIONS:  Current Outpatient Medications  Medication Sig Dispense Refill   acetaminophen (TYLENOL) 500 MG tablet Take 500 mg  by mouth every 6 (six) hours as needed for moderate pain or headache.     albuterol (PROAIR HFA) 108 (90 Base) MCG/ACT inhaler Inhale 2 puffs into the lungs every 6 (six) hours as needed for wheezing or shortness of breath. 24 g 0   albuterol (PROVENTIL) (2.5 MG/3ML) 0.083% nebulizer solution Take 3 mLs (2.5 mg total) by nebulization every 6 (six) hours as needed for wheezing or shortness of breath. (Patient taking differently: Take 2.5 mg by nebulization 2 (two) times daily.) 540 mL 3   aspirin EC 81 MG tablet Take 81 mg by mouth daily with lunch.      benzonatate (TESSALON) 100 MG capsule Take 2 capsules (200 mg total) by mouth 3 (three) times daily as needed for cough. (Patient not taking: Reported on 01/25/2021) 30 capsule 1   buPROPion (WELLBUTRIN XL) 300 MG 24 hr tablet Take 1 tablet (300 mg total) by mouth daily. 90 tablet 3   Cholecalciferol (DIALYVITE VITAMIN D 5000) 125 MCG (5000 UT) capsule Take 5,000 Units by mouth daily.     esomeprazole (NEXIUM) 20 MG capsule Take 1 capsule (20 mg total) by mouth daily at 12 noon. 1 hour prior to meal (Patient taking differently: Take 20 mg by mouth daily.) 30 capsule 1   fluticasone (FLONASE) 50 MCG/ACT nasal spray Place 1 spray into both nostrils as needed for allergies or rhinitis.     Guaifenesin (MUCINEX MAXIMUM STRENGTH) 1200 MG TB12 Take 1,200 mg by mouth daily.     HYDROcodone bit-homatropine (HYCODAN) 5-1.5 MG/5ML syrup Take  5 mLs by mouth every 6 (six) hours as needed for cough. (Patient not taking: Reported on 01/25/2021) 120 mL 0   Magnesium 500 MG TABS Take 500 mg by mouth daily with lunch.     Multiple Vitamins-Minerals (IMMUNE SUPPORT PO) Take 1 tablet by mouth daily.     prochlorperazine (COMPAZINE) 10 MG tablet Take 1 tablet (10 mg total) by mouth every 6 (six) hours as needed for nausea or vomiting. 30 tablet 0   Respiratory Therapy Supplies (FLUTTER) DEVI Use as directed 1 each 0   rosuvastatin (CRESTOR) 10 MG tablet Take 10 mg by  mouth daily.     rosuvastatin (CRESTOR) 20 MG tablet Take 1 tablet (20 mg total) by mouth daily. (Patient not taking: Reported on 01/25/2021) 90 tablet 3   sucralfate (CARAFATE) 1 g tablet Take 1 tablet (1 g total) by mouth 4 (four) times daily -  with meals and at bedtime. Crush and dissolve in 10 mL of warm water prior to swallowing (Patient taking differently: Take 1 g by mouth daily. Crush and dissolve in 10 mL of warm water prior to swallowing) 120 tablet 1   temazepam (RESTORIL) 15 MG capsule Take 1 capsule (15 mg total) by mouth at bedtime as needed. 90 capsule 0   umeclidinium-vilanterol (ANORO ELLIPTA) 62.5-25 MCG/INH AEPB Inhale 1 puff into the lungs daily. 180 each 3   vitamin B-12 (CYANOCOBALAMIN) 1000 MCG tablet Take 1,000 mcg by mouth daily.     No current facility-administered medications for this visit.   Facility-Administered Medications Ordered in Other Visits  Medication Dose Route Frequency Provider Last Rate Last Admin   Sonafine emulsion 1 application  1 application Topical Once Gery Pray, MD        SURGICAL HISTORY:  Past Surgical History:  Procedure Laterality Date   BIOPSY  01/14/2021   Procedure: BIOPSY;  Surgeon: Garner Nash, DO;  Location: [facility];  Service: Pulmonary;;   BRONCHIAL BIOPSY  10/08/2020   Procedure: BRONCHIAL BIOPSIES;  Surgeon: Garner Nash, DO;  Location: [facility];  Service: Pulmonary;;   BRONCHIAL BRUSHINGS  10/08/2020   Procedure: BRONCHIAL BRUSHINGS;  Surgeon: Garner Nash, DO;  Location: [facility];  Service: Pulmonary;;   BRONCHIAL BRUSHINGS  01/14/2021   Procedure: BRONCHIAL BRUSHINGS;  Surgeon: Garner Nash, DO;  Location: [facility];  Service: Pulmonary;;   BRONCHIAL WASHINGS  10/08/2020   Procedure: BRONCHIAL WASHINGS;  Surgeon: Garner Nash, DO;  Location: [facility];  Service: Pulmonary;;   BRONCHIAL WASHINGS  01/14/2021   Procedure: BRONCHIAL WASHINGS;  Surgeon: Garner Nash, DO;  Location: [facility];  Service: Pulmonary;;   FINE NEEDLE ASPIRATION  10/08/2020   Procedure: FINE NEEDLE ASPIRATION (FNA) LINEAR;  Surgeon: Garner Nash, DO;  Location: [facility];  Service: Pulmonary;;   LEFT HEART CATH AND CORONARY ANGIOGRAPHY N/A 11/07/2017   Procedure: LEFT HEART CATH AND CORONARY ANGIOGRAPHY;  Surgeon: Nelva Bush, MD;  Location: [facility];  Service: Cardiovascular;  Laterality: N/A;   NASAL SINUS SURGERY  1986   VIDEO BRONCHOSCOPY Bilateral 01/29/2019   Procedure: VIDEO BRONCHOSCOPY WITH FLUORO;  Surgeon: Marshell Garfinkel, MD;  Location: [facility];  Service: Cardiopulmonary;  Laterality: Bilateral;   VIDEO BRONCHOSCOPY Right 01/14/2021   Procedure: VIDEO BRONCHOSCOPY WITHOUT FLUORO;  Surgeon: Garner Nash, DO;  Location: [facility];  Service: Pulmonary;  Laterality: Right;  possible cryotherapy   VIDEO BRONCHOSCOPY WITH ENDOBRONCHIAL ULTRASOUND N/A 10/08/2020   Procedure: VIDEO BRONCHOSCOPY WITH ENDOBRONCHIAL  ULTRASOUND;  Surgeon: Garner Nash, DO;  Location: [facility];  Service: Pulmonary;  Laterality: N/A;    REVIEW OF SYSTEMS:  A comprehensive review of systems was negative except for: Constitutional: positive for fatigue Respiratory: positive for dyspnea on exertion   PHYSICAL EXAMINATION: General appearance: alert, cooperative, fatigued, and no distress Head: Normocephalic, without obvious abnormality, atraumatic Neck: no adenopathy, no JVD, supple, symmetrical, trachea midline, and thyroid not enlarged, symmetric, no tenderness/mass/nodules Lymph nodes: Cervical, supraclavicular, and axillary nodes normal. Resp: clear to auscultation bilaterally Back: symmetric, no curvature. ROM normal. No CVA tenderness. Cardio: regular rate and rhythm, S1, S2 normal, no murmur, click, rub or gallop GI: soft, non-tender; bowel sounds normal; no masses,  no organomegaly Extremities: extremities normal, atraumatic, no cyanosis or edema  ECOG PERFORMANCE  STATUS: 1 - Symptomatic but completely ambulatory  Blood pressure (!) 142/82, pulse 92, temperature 99.6 F (37.6 C), temperature source Tympanic, resp. rate 20, height 5\' 4"  (1.626 m), weight 130 lb 9.6 oz (59.2 kg), SpO2 94 %.  LABORATORY DATA: Lab Results  Component Value Date   WBC 4.4 02/09/2021   HGB 12.1 02/09/2021   HCT 35.6 (L) 02/09/2021   MCV 88.1 02/09/2021   PLT 105 (L) 02/09/2021      Chemistry      Component Value Date/Time   NA 140 02/09/2021 1031   NA 144 02/10/2020 0813   K 4.0 02/09/2021 1031   CL 107 02/09/2021 1031   CO2 24 02/09/2021 1031   BUN 13 02/09/2021 1031   BUN 10 02/10/2020 0813   CREATININE 0.74 02/09/2021 1031   CREATININE 0.84 11/11/2019 1411      Component Value Date/Time   CALCIUM 8.8 (L) 02/09/2021 1031   ALKPHOS 103 02/09/2021 1031   AST 26 02/09/2021 1031   ALT 28 02/09/2021 1031   BILITOT 0.3 02/09/2021 1031       RADIOGRAPHIC STUDIES: DG Chest 2 View  Result Date: 01/18/2021 CLINICAL DATA:  Shortness of breath.  History of lung cancer. EXAM: CHEST - 2 VIEW COMPARISON:  CT chest dated December 31, 2020. Chest x-ray dated October 08, 2020. FINDINGS: The heart size and mediastinal contours are within normal limits. The lungs remain hyperinflated with emphysematous changes. Partial collapse of the right middle lobe, best appreciated on the lateral view, improved since recent CT. No focal consolidation, pleural effusion, or pneumothorax. No acute osseous abnormality. IMPRESSION: 1. Partial right middle lobe collapse, improved from recent CT. 2. COPD. Electronically Signed   By: Titus Dubin M.D.   On: 01/18/2021 17:09    ASSESSMENT AND PLAN: This is a very pleasant 65 years old white female recently diagnosed with a stage IIIA (TX, N2, M0) non-small cell lung cancer, squamous cell carcinoma presented with subcarinal mass/lymphadenopathy diagnosed in July 2022. The patient underwent a course of concurrent chemoradiation with weekly  carboplatin for AUC of 2 and paclitaxel 45 Mg/M2 status post 7 cycles. The patient tolerated her treatment well except for fatigue and sore throat. She had repeat CT scan of the chest performed recently.  I personally and independently reviewed the scan images and discussed the result and showed the images to the patient today. Her scan showed improvement of the subcarinal lymph node that was the presentation of her disease.  It also showed collapse of the right middle lobe that was biopsy-proven to be consistent with malignancy. The patient is currently undergoing consolidation treatment with immunotherapy with Imfinzi 1500 Mg IV every 4 weeks status post 1 cycle.  I recommended for the patient to proceed with cycle #2 today as planned. We will consider her for repeat CT scan of the chest after cycle #3 and if there is any further disease progression or no improvement in her current condition, I may consider The patient for systemic chemotherapy or additional radiation to the obstructive lesion. The patient will continue to follow-up with Dr. Valeta Harms for her shortness of breath. She will come back for follow-up visit in 4 weeks for evaluation. She was advised to call immediately if she has any concerning symptoms in the interval.  The patient voices understanding of current disease status and treatment options and is in agreement with the current care plan.  All questions were answered. The patient knows to call the clinic with any problems, questions or concerns. We can certainly see the patient much sooner if necessary.  Disclaimer: This note was dictated with voice recognition software. Similar sounding words can inadvertently be transcribed and may not be corrected upon review.

## 2021-02-09 NOTE — Patient Instructions (Signed)
[facility]  Discharge Instructions: Thank you for choosing [facility] to provide your oncology and hematology care.   If you have a lab appointment with the Cancer Center, please go directly to the Cancer Center and check in at the registration area.   Wear comfortable clothing and clothing appropriate for easy access to any Portacath or PICC line.   We strive to give you quality time with your provider. You may need to reschedule your appointment if you arrive late (15 or more minutes).  Arriving late affects you and other patients whose appointments are after yours.  Also, if you miss three or more appointments without notifying the office, you may be dismissed from the clinic at the provider's discretion.      For prescription refill requests, have your pharmacy contact our office and allow 72 hours for refills to be completed.    Today you received the following chemotherapy and/or immunotherapy agents Imfinzi      To help prevent nausea and vomiting after your treatment, we encourage you to take your nausea medication as directed.  BELOW ARE SYMPTOMS THAT SHOULD BE REPORTED IMMEDIATELY: *FEVER GREATER THAN 100.4 F (38 C) OR HIGHER *CHILLS OR SWEATING *NAUSEA AND VOMITING THAT IS NOT CONTROLLED WITH YOUR NAUSEA MEDICATION *UNUSUAL SHORTNESS OF BREATH *UNUSUAL BRUISING OR BLEEDING *URINARY PROBLEMS (pain or burning when urinating, or frequent urination) *BOWEL PROBLEMS (unusual diarrhea, constipation, pain near the anus) TENDERNESS IN MOUTH AND THROAT WITH OR WITHOUT PRESENCE OF ULCERS (sore throat, sores in mouth, or a toothache) UNUSUAL RASH, SWELLING OR PAIN  UNUSUAL VAGINAL DISCHARGE OR ITCHING   Items with * indicate a potential emergency and should be followed up as soon as possible or go to the Emergency Department if any problems should occur.  Please show the CHEMOTHERAPY ALERT CARD or IMMUNOTHERAPY ALERT CARD at check-in to the  Emergency Department and triage nurse.  Should you have questions after your visit or need to cancel or reschedule your appointment, please contact [facility]  Dept: [phone]  and follow the prompts.  Office hours are 8:00 a.m. to 4:30 p.m. Monday - Friday. Please note that voicemails left after 4:00 p.m. may not be returned until the following business day.  We are closed weekends and major holidays. You have access to a nurse at all times for urgent questions. Please call the main number to the clinic Dept: [phone] and follow the prompts.   For any non-urgent questions, you may also contact your provider using MyChart. We now offer e-Visits for anyone 18 and older to request care online for non-urgent symptoms. For details visit [email or web address].   Also download the MyChart app! Go to the app store, search "MyChart", open the app, select [facility], and log in with your MyChart username and password.  Due to Covid, a mask is required upon entering the hospital/clinic. If you do not have a mask, one will be given to you upon arrival. For doctor visits, patients may have 1 support person aged 18 or older with them. For treatment visits, patients cannot have anyone with them due to current Covid guidelines and our immunocompromised population.   

## 2021-02-10 ENCOUNTER — Telehealth: Payer: Self-pay | Admitting: Internal Medicine

## 2021-02-10 ENCOUNTER — Telehealth: Payer: Self-pay | Admitting: Pulmonary Disease

## 2021-02-10 ENCOUNTER — Encounter: Payer: Self-pay | Admitting: Pulmonary Disease

## 2021-02-10 ENCOUNTER — Ambulatory Visit (INDEPENDENT_AMBULATORY_CARE_PROVIDER_SITE_OTHER): Payer: Medicare Other | Admitting: Pulmonary Disease

## 2021-02-10 VITALS — BP 122/78 | HR 94 | Temp 98.4°F | Ht 65.0 in | Wt 134.6 lb

## 2021-02-10 DIAGNOSIS — C3491 Malignant neoplasm of unspecified part of right bronchus or lung: Secondary | ICD-10-CM | POA: Diagnosis not present

## 2021-02-10 DIAGNOSIS — R051 Acute cough: Secondary | ICD-10-CM

## 2021-02-10 DIAGNOSIS — R0602 Shortness of breath: Secondary | ICD-10-CM

## 2021-02-10 DIAGNOSIS — J449 Chronic obstructive pulmonary disease, unspecified: Secondary | ICD-10-CM

## 2021-02-10 DIAGNOSIS — J479 Bronchiectasis, uncomplicated: Secondary | ICD-10-CM | POA: Diagnosis not present

## 2021-02-10 LAB — MAC SUSCEPTIBILITY BROTH
Ciprofloxacin: >8
Clarithromycin: 2
Linezolid: 32
Moxifloxacin: >4
Rifampin: >4
Streptomycin: >32

## 2021-02-10 LAB — AFB ORGANISM ID BY DNA PROBE
M avium complex: POSITIVE — AB
M tuberculosis complex: NEGATIVE

## 2021-02-10 LAB — ACID FAST CULTURE WITH REFLEXED SENSITIVITIES (MYCOBACTERIA): Acid Fast Culture: POSITIVE — AB

## 2021-02-10 LAB — FUNGUS CULTURE RESULT

## 2021-02-10 LAB — FUNGAL ORGANISM REFLEX

## 2021-02-10 LAB — FUNGUS CULTURE WITH STAIN

## 2021-02-10 MED ORDER — PREDNISONE 20 MG PO TABS: 40.0000 mg | ORAL_TABLET | Freq: Every day | ORAL | 0 refills | Status: DC

## 2021-02-10 MED ORDER — BREZTRI AEROSPHERE 160-9-4.8 MCG/ACT IN AERO: 2.0000 | INHALATION_SPRAY | Freq: Two times a day (BID) | RESPIRATORY_TRACT | 0 refills | Status: DC

## 2021-02-10 NOTE — Progress Notes (Signed)
Synopsis: Referred in January 2021 for former smoker quit 2014 moderate COPD, MAI by Carlena Hurl, PA-C  Subjective:   PATIENT ID: Katrina Campbell GENDER: female DOB: 10-11-1955, MRN: [id number]  Chief Complaint  Patient presents with   Follow-up    Sob Burning pain on ribs or around entire area, talked to dr. Julien Nordmann about it yesterday and was told to talk to dr.Isabell Bonafede     [name] moderate COPD, former smoker quit 2014, MAI patient last seen in the office by Dr. Vaughan Browner.  Former Dr. Lenna Gilford patient.  Treated for stenotrophomonas in the past.  Prior 3 sputum's AFB, 1/3+ for MAI.  Saw infectious disease to discuss initiation of therapy.  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  Patient was last seen by infectious disease on 04/15/2019.  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  Just started on azithromycin 500 mg daily, rifampin 600 mg daily plus ethambutol 900 mg daily.  Patient does have CT imaging with lower lobe bronchiectasis.  Patient has chronic sputum production.  She is short of breath with daily cough and sputum production.  During this time she is very anxious and hesitant about going out in public due to her chronic cough.  She denies hemoptysis patient's weight has also been stable.  OV 11/06/2019: around the 4th of July patient had congestion and cough that still hasnt really gone away.  Overall doing much better now.  Has less cough and congestion.  Recent follow-up with infectious disease plan for Monday.  Been compliant with her medications.  Still has some daily sputum production.  Rarely has a streak of blood with cough.  OV 04/14/2020: Here today for follow-up.  Establish care with me back last year.  Was already under treatment for her MAI.  Followed up with infectious disease here in [place] as well as  met with ID at [facility].  Decision was made to come off of treatments.  Recent sputum's have been negative.  She does have significant emphysema currently compliant with inhaler regimen.  From a respiratory standpoint she is doing fine.  No significant symptom change after stopping antibiotics.   OV 10/01/2020: Here today for follow-up had abnormal lung cancer screening CT follow-up with an enlarged mediastinal node.  Patient was sent for nuclear medicine PET scan.  PET scan revealed a significantly PET avid station 7 subcarinal node with SUV of 11.  We reviewed this today in the office.  Concerning for underlying malignancy.  OV 10/09/2020: Here today for follow-up.  Recent CT scan of the chest had enlarged mediastinal node.  PET scan with PET avid station 7 and irregular lining of the right mainstem.  Patient was taken for video bronchoscopy on 10/08/2020.  Bronchoscopy revealed enlarged subcarinal adenopathy which was sampled under ultrasound guidance.  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  Endobronchial brushings, forcep biopsies of the abnormal mucosa and visible tumor were taken.  OV 02/10/2021: having difficulty breathing, shes is more short of breath.  She was taken for bronchoscopy recently with repeat biopsy which did show persistence of her malignancy.  Her culture results also were positive again for MAI.  I did reach out to infectious disease today for her symptoms should consider treatment options again.  Especially in lieu of of her immune suppression  receiving intermittent therapy.     Past Medical History:  Diagnosis Date   Anxiety    COPD (chronic obstructive pulmonary disease) ([place])    Coronary artery disease    Dental staining 07/15/2019   Depression    History of radiation therapy    Right lung- 11/03/20-12/15/20- Dr. Gery Pray   Hyperlipidemia    Labile hypertension    Myocardial  infarction [facility])    Pneumonia    Pulmonary mycobacterial infection ([place]) 10/24/2018     Family History  Problem Relation Age of Onset   Heart attack Mother    Emphysema Mother    Congestive Heart Failure Mother    Heart attack Father    Congestive Heart Failure Father    Asthma Other    Lung disease Sister    Lung disease Brother    Colon cancer Neg Hx      Past Surgical History:  Procedure Laterality Date   BIOPSY  01/14/2021   Procedure: BIOPSY;  Surgeon: Garner Nash, DO;  Location: [place] [facility];  Service: Pulmonary;;   BRONCHIAL BIOPSY  10/08/2020   Procedure: BRONCHIAL BIOPSIES;  Surgeon: Garner Nash, DO;  Location: [place] [facility];  Service: Pulmonary;;   BRONCHIAL BRUSHINGS  10/08/2020   Procedure: BRONCHIAL BRUSHINGS;  Surgeon: Garner Nash, DO;  Location: [place] [facility];  Service: Pulmonary;;   BRONCHIAL BRUSHINGS  01/14/2021   Procedure: BRONCHIAL BRUSHINGS;  Surgeon: Garner Nash, DO;  Location: [place] [facility];  Service: Pulmonary;;   BRONCHIAL WASHINGS  10/08/2020   Procedure: BRONCHIAL WASHINGS;  Surgeon: Garner Nash, DO;  Location: [place] [facility];  Service: Pulmonary;;   BRONCHIAL WASHINGS  01/14/2021   Procedure: BRONCHIAL WASHINGS;  Surgeon: Garner Nash, DO;  Location: [place];  Service: Pulmonary;;   FINE NEEDLE ASPIRATION  10/08/2020   Procedure: FINE NEEDLE ASPIRATION (FNA) LINEAR;  Surgeon: Garner Nash, DO;  Location: [place] [facility];  Service: Pulmonary;;   LEFT HEART CATH AND CORONARY ANGIOGRAPHY N/A 11/07/2017   Procedure: LEFT HEART CATH AND CORONARY ANGIOGRAPHY;  Surgeon: Nelva Bush, MD;  Location: [place] [facility];  Service: Cardiovascular;  Laterality: N/A;   NASAL SINUS SURGERY  1986   VIDEO BRONCHOSCOPY Bilateral 01/29/2019   Procedure: VIDEO BRONCHOSCOPY WITH FLUORO;  Surgeon: Marshell Garfinkel, MD;  Location: [place];  Service: Cardiopulmonary;  Laterality: Bilateral;   VIDEO BRONCHOSCOPY Right 01/14/2021   Procedure:  VIDEO BRONCHOSCOPY WITHOUT FLUORO;  Surgeon: Garner Nash, DO;  Location: [place];  Service: Pulmonary;  Laterality: Right;  possible cryotherapy   VIDEO BRONCHOSCOPY WITH ENDOBRONCHIAL ULTRASOUND N/A 10/08/2020   Procedure: VIDEO BRONCHOSCOPY WITH ENDOBRONCHIAL ULTRASOUND;  Surgeon: Garner Nash, DO;  Location: [place];  Service: Pulmonary;  Laterality: N/A;    Social History   Socioeconomic History   Marital status: Married    Spouse name: Not on file   Number of children: Not on file   Years of education: Not on file   Highest education level: Not on file  Occupational History   Not on file  Tobacco Use   Smoking status: Former    Packs/day: 1.75    Years: 44.00    Pack years: 77.00    Types: Cigarettes    Quit date: 11/09/2012    Years since quitting: 8.2   Smokeless tobacco: Never   Tobacco comments:    vapor  Vaping Use   Vaping Use: Former  Substance and Sexual Activity   Alcohol use: Not Currently    Alcohol/week:  24.0 standard drinks    Types: 24 Cans of beer per week   Drug use: Not Currently   Sexual activity: Not Currently  Other Topics Concern   Not on file  Social History Narrative   Not on file   Social Determinants of Health   Financial Resource Strain: Medium Risk   Difficulty of Paying Living Expenses: Somewhat hard  Food Insecurity: No Food Insecurity   Worried About Running Out of Food in the Last Year: Never true   Ran Out of Food in the Last Year: Never true  Transportation Needs: No Transportation Needs   Lack of Transportation (Medical): No   Lack of Transportation (Non-Medical): No  Physical Activity: Not on file  Stress: Stress Concern Present   Feeling of Stress : Rather much  Social Connections: Unknown   Frequency of Communication with Friends and Family: More than three times a week   Frequency of Social Gatherings with Friends and Family: More than three times a week   Attends Religious Services: More than 4 times per  year   Active Member of [employer] or Organizations: Not on file   Attends Archivist Meetings: Not on file   Marital Status: Married  Human resources officer Violence: Not on file     Allergies  Allergen Reactions   Data processing manager [Budeson-Glycopyrrol-Formoterol] Shortness Of Breath   Benadryl [Diphenhydramine] Nausea Only   Codeine Other (See Comments)    Causes pt to blackout   Statins     Myalgias at higher doses      Outpatient Medications Prior to Visit  Medication Sig Dispense Refill   acetaminophen (TYLENOL) 500 MG tablet Take 500 mg by mouth every 6 (six) hours as needed for moderate pain or headache.     albuterol (PROVENTIL) (2.5 MG/3ML) 0.083% nebulizer solution Take 3 mLs (2.5 mg total) by nebulization every 6 (six) hours as needed for wheezing or shortness of breath. (Patient taking differently: Take 2.5 mg by nebulization 2 (two) times daily.) 540 mL 3   aspirin EC 81 MG tablet Take 81 mg by mouth daily with lunch.      buPROPion (WELLBUTRIN XL) 300 MG 24 hr tablet Take 1 tablet (300 mg total) by mouth daily. 90 tablet 3   Cholecalciferol (DIALYVITE VITAMIN D 5000) 125 MCG (5000 UT) capsule Take 5,000 Units by mouth daily.     fluticasone (FLONASE) 50 MCG/ACT nasal spray Place 1 spray into both nostrils as needed for allergies or rhinitis.     Guaifenesin (MUCINEX MAXIMUM STRENGTH) 1200 MG TB12 Take 1,200 mg by mouth daily.     Magnesium 500 MG TABS Take 500 mg by mouth daily with lunch.     Multiple Vitamins-Minerals (IMMUNE SUPPORT PO) Take 1 tablet by mouth daily.     Respiratory Therapy Supplies (FLUTTER) DEVI Use as directed 1 each 0   sucralfate (CARAFATE) 1 g tablet Take 1 tablet (1 g total) by mouth 4 (four) times daily -  with meals and at bedtime. Crush and dissolve in 10 mL of warm water prior to swallowing (Patient taking differently: Take 1 g by mouth daily. Crush and dissolve in 10 mL of warm water prior to swallowing) 120 tablet 1    umeclidinium-vilanterol (ANORO ELLIPTA) 62.5-25 MCG/INH AEPB Inhale 1 puff into the lungs daily. 180 each 3   vitamin B-12 (CYANOCOBALAMIN) 1000 MCG tablet Take 1,000 mcg by mouth daily.     esomeprazole (NEXIUM) 20 MG capsule Take 1 capsule (20 mg total) by  mouth daily at 12 noon. 1 hour prior to meal (Patient taking differently: Take 20 mg by mouth daily.) 30 capsule 1   HYDROcodone bit-homatropine (HYCODAN) 5-1.5 MG/5ML syrup Take 5 mLs by mouth every 6 (six) hours as needed for cough. 120 mL 0   rosuvastatin (CRESTOR) 10 MG tablet Take 10 mg by mouth daily.     temazepam (RESTORIL) 15 MG capsule Take 1 capsule (15 mg total) by mouth at bedtime as needed. 90 capsule 0   prochlorperazine (COMPAZINE) 10 MG tablet Take 1 tablet (10 mg total) by mouth every 6 (six) hours as needed for nausea or vomiting. (Patient not taking: Reported on 02/10/2021) 30 tablet 0   rosuvastatin (CRESTOR) 20 MG tablet Take 1 tablet (20 mg total) by mouth daily. (Patient not taking: No sig reported) 90 tablet 3   Facility-Administered Medications Prior to Visit  Medication Dose Route Frequency Provider Last Rate Last Admin   Sonafine emulsion 1 application  1 application Topical Once Gery Pray, MD        Review of Systems  Constitutional:  Negative for chills, fever, malaise/fatigue and weight loss.  HENT:  Negative for hearing loss, sore throat and tinnitus.   Eyes:  Negative for blurred vision and double vision.  Respiratory:  Positive for cough and shortness of breath. Negative for hemoptysis, sputum production, wheezing and stridor.   Cardiovascular:  Negative for chest pain, palpitations, orthopnea, leg swelling and PND.  Gastrointestinal:  Negative for abdominal pain, constipation, diarrhea, heartburn, nausea and vomiting.  Genitourinary:  Negative for dysuria, hematuria and urgency.  Musculoskeletal:  Negative for joint pain and myalgias.  Skin:  Negative for itching and rash.  Neurological:  Negative for  dizziness, tingling, weakness and headaches.  Endo/Heme/Allergies:  Negative for environmental allergies. Does not bruise/bleed easily.  Psychiatric/Behavioral:  Negative for depression. The patient is not nervous/anxious and does not have insomnia.   All other systems reviewed and are negative.   Objective:  Physical Exam Vitals reviewed.  Constitutional:      General: She is not in acute distress.    Appearance: She is well-developed.  HENT:     Head: Normocephalic and atraumatic.  Eyes:     General: No scleral icterus.    Conjunctiva/sclera: Conjunctivae normal.     Pupils: Pupils are equal, round, and reactive to light.  Neck:     Vascular: No JVD.     Trachea: No tracheal deviation.  Cardiovascular:     Rate and Rhythm: Normal rate and regular rhythm.     Heart sounds: No murmur heard. Pulmonary:     Effort: Pulmonary effort is normal. No tachypnea, accessory muscle usage or respiratory distress.     Breath sounds: No stridor. No wheezing, rhonchi or rales.  Abdominal:     General: Bowel sounds are normal. There is no distension.     Palpations: Abdomen is soft.     Tenderness: There is no abdominal tenderness.  Musculoskeletal:        General: No tenderness.     Cervical back: Neck supple.  Lymphadenopathy:     Cervical: No cervical adenopathy.  Skin:    General: Skin is warm and dry.     Capillary Refill: Capillary refill takes less than 2 seconds.     Findings: No rash.  Neurological:     Mental Status: She is alert and oriented to person, place, and time.  Psychiatric:        Behavior: Behavior normal.  Vitals:   02/10/21 1107  BP: 122/78  Pulse: 94  Temp: 98.4 F (36.9 C)  TempSrc: Oral  SpO2: 95%  Weight: 134 lb 9.6 oz (61.1 kg)  Height: _0  (1.651 m)    95% on RA BMI Readings from Last 3 Encounters:  02/12/21 22.07 kg/m  02/10/21 22.40 kg/m  02/09/21 22.42 kg/m   Wt Readings from Last 3 Encounters:  02/12/21 132 lb 9.6 oz (60.1 kg)   02/10/21 134 lb 9.6 oz (61.1 kg)  02/09/21 130 lb 9.6 oz (59.2 kg)     CBC    Component Value Date/Time   WBC 4.4 02/09/2021 1031   WBC 5.4 05/29/2018 1232   RBC 4.04 02/09/2021 1031   HGB 12.1 02/09/2021 1031   HGB 13.7 09/22/2020 1325   HCT 35.6 (L) 02/09/2021 1031   HCT 41.2 09/22/2020 1325   PLT 105 (L) 02/09/2021 1031   PLT 143 (L) 09/22/2020 1325   MCV 88.1 02/09/2021 1031   MCV 89 09/22/2020 1325   MCH 30.0 02/09/2021 1031   MCHC 34.0 02/09/2021 1031   RDW 13.1 02/09/2021 1031   RDW 12.9 09/22/2020 1325   LYMPHSABS 0.8 02/09/2021 1031   LYMPHSABS 2.1 09/22/2020 1325   MONOABS 0.4 02/09/2021 1031   EOSABS 0.1 02/09/2021 1031   EOSABS 0.1 09/22/2020 1325   BASOSABS 0.0 02/09/2021 1031   BASOSABS 0.0 09/22/2020 1325    Chest Imaging:  May 2020 CT chest: Scattered nodules.  Lower lobe small areas of bronchiectasis. The patient's images have been independently reviewed by me.   PET scan 09/29/2020: SUV of 11, subcarinal adenopathy concerning for malignancy. The patient's images have been independently reviewed by me.    Pulmonary Functions Testing Results: No flowsheet data found.  FeNO: none   Pathology: none   Echocardiogram: none  Heart Catheterization: none     Assessment & Plan:     ICD-10-CM   1. Shortness of breath  R06.02 CT CHEST HIGH RESOLUTION    2. NSCLC of right lung (HCC)  C34.91     3. Bronchiectasis without complication ([place])  F02.6     4. Acute cough  R05.1     5. Stage 2 moderate COPD by GOLD classification ([place])  J44.9        Discussion:  [name], moderate COPD FEV1 of 66%, longstanding history of smoking was enrolled in her lung cancer screening program.  Found to have a enlarged subcarinal node which was new between scans PET avid lesion concerning for malignancy and was taken for bronchoscopy on 10/08/2020 which revealed right-sided bronchi endobronchial cancer and associated adenopathy.  Patient was  diagnosed with non-small cell lung cancer and started on chemotherapy and radiation.  Plan: Was taken to repeat bronchoscopy due to right middle lobe collapse as well as tissue biopsy again for some endobronchial disease that did confirm persistence of non-small cell. Patient currently undergoing treatments with immunotherapy. Cultures also grew MAI again.  She was on therapy in the past. She would like to reach out to anything in to discuss treatment. She is feeling generally little more rundown than previously not sure if this is related to MAI or not actually presenting recent chemoradiation therapy. Also give her samples today of Breztri to see how she does with it  RTC in a couple weeks to see me or Eric Form.   Current Outpatient Medications:    acetaminophen (TYLENOL) 500 MG tablet, Take 500 mg by mouth every 6 (six)  hours as needed for moderate pain or headache., Disp: , Rfl:    albuterol (PROVENTIL) (2.5 MG/3ML) 0.083% nebulizer solution, Take 3 mLs (2.5 mg total) by nebulization every 6 (six) hours as needed for wheezing or shortness of breath. (Patient taking differently: Take 2.5 mg by nebulization 2 (two) times daily.), Disp: 540 mL, Rfl: 3   aspirin EC 81 MG tablet, Take 81 mg by mouth daily with lunch. , Disp: , Rfl:    buPROPion (WELLBUTRIN XL) 300 MG 24 hr tablet, Take 1 tablet (300 mg total) by mouth daily., Disp: 90 tablet, Rfl: 3   Cholecalciferol (DIALYVITE VITAMIN D 5000) 125 MCG (5000 UT) capsule, Take 5,000 Units by mouth daily., Disp: , Rfl:    fluticasone (FLONASE) 50 MCG/ACT nasal spray, Place 1 spray into both nostrils as needed for allergies or rhinitis., Disp: , Rfl:    Guaifenesin (MUCINEX MAXIMUM STRENGTH) 1200 MG TB12, Take 1,200 mg by mouth daily., Disp: , Rfl:    Magnesium 500 MG TABS, Take 500 mg by mouth daily with lunch., Disp: , Rfl:    Multiple Vitamins-Minerals (IMMUNE SUPPORT PO), Take 1 tablet by mouth daily., Disp: , Rfl:    Respiratory Therapy  Supplies (FLUTTER) DEVI, Use as directed, Disp: 1 each, Rfl: 0   sucralfate (CARAFATE) 1 g tablet, Take 1 tablet (1 g total) by mouth 4 (four) times daily -  with meals and at bedtime. Crush and dissolve in 10 mL of warm water prior to swallowing (Patient taking differently: Take 1 g by mouth daily. Crush and dissolve in 10 mL of warm water prior to swallowing), Disp: 120 tablet, Rfl: 1   umeclidinium-vilanterol (ANORO ELLIPTA) 62.5-25 MCG/INH AEPB, Inhale 1 puff into the lungs daily., Disp: 180 each, Rfl: 3   vitamin B-12 (CYANOCOBALAMIN) 1000 MCG tablet, Take 1,000 mcg by mouth daily., Disp: , Rfl:    esomeprazole (NEXIUM) 40 MG capsule, Take 1 capsule (40 mg total) by mouth daily., Disp: 90 capsule, Rfl: 3   predniSONE (DELTASONE) 20 MG tablet, Take 2 tablets (40 mg total) by mouth daily with breakfast., Disp: 10 tablet, Rfl: 0   rosuvastatin (CRESTOR) 10 MG tablet, Take 1 tablet (10 mg total) by mouth daily., Disp: 90 tablet, Rfl: 3   temazepam (RESTORIL) 15 MG capsule, Take 1 capsule (15 mg total) by mouth at bedtime as needed., Disp: 30 capsule, Rfl: 2 No current facility-administered medications for this visit.  Facility-Administered Medications Ordered in Other Visits:    Sonafine emulsion 1 application, 1 application, Topical, Once, Gery Pray, MD    Garner Nash, DO [facility] 02/12/2021 5:43 PM

## 2021-02-10 NOTE — Telephone Encounter (Signed)
I called the patient back and she is still feeling that she is having more increased shortness of breath and she felt her that her airway was closing up after 1 use.  I did place as an allergy on her chart.   She has stopped the [place] and I have placed as a high allergy. She is aware to seek emergency care if the symptoms become worse. She did take the Pro air recuse about 10-15 minutes ago and she is feeling better that she has been able to breath. Please advise on what to do.

## 2021-02-10 NOTE — Telephone Encounter (Signed)
Scheduled follow-up appointments per 1/18 los. Patient is aware. °

## 2021-02-10 NOTE — Patient Instructions (Addendum)
Thank you for visiting Dr. Valeta Harms at [facility]. Today we recommend the following:  Orders Placed This Encounter  Procedures   CT CHEST HIGH RESOLUTION   I reached out to Dr. Baxter Flattery. Hopefully we can get you a follow up with them.   Breztri samples today if we have them and new prescription   Return in about 4 weeks (around 03/10/2021) for with Eric Form, NP, or Dr. Valeta Harms.    Please do your part to reduce the spread of COVID-19.

## 2021-02-10 NOTE — Telephone Encounter (Signed)
Icard, Octavio Graves, DO  P [facility] Triage Pool Caller: Unspecified (Today,  2:53 PM)  I doubt this an allergy to her. But ok to put on her allergy list for the time being. Start prednisone 40mg  X5 days.   Thanks   BLI   I called and spoke with the pt and notified of response per Dr Valeta Harms. I have sent order to pharm for pred. Nothing further needed.

## 2021-02-11 ENCOUNTER — Telehealth: Payer: Self-pay | Admitting: Internal Medicine

## 2021-02-11 NOTE — Telephone Encounter (Signed)
Pt is due for an pneumonia shot. I will check with patient to see if she wants to come in to get this. Which shot ok to give?

## 2021-02-12 ENCOUNTER — Other Ambulatory Visit: Payer: Self-pay

## 2021-02-12 ENCOUNTER — Ambulatory Visit (INDEPENDENT_AMBULATORY_CARE_PROVIDER_SITE_OTHER): Payer: Medicare Other | Admitting: Medical

## 2021-02-12 VITALS — BP 122/72 | HR 99 | Wt 132.6 lb

## 2021-02-12 DIAGNOSIS — J449 Chronic obstructive pulmonary disease, unspecified: Secondary | ICD-10-CM

## 2021-02-12 DIAGNOSIS — E559 Vitamin D deficiency, unspecified: Secondary | ICD-10-CM | POA: Diagnosis not present

## 2021-02-12 DIAGNOSIS — E785 Hyperlipidemia, unspecified: Secondary | ICD-10-CM

## 2021-02-12 DIAGNOSIS — T466X5A Adverse effect of antihyperlipidemic and antiarteriosclerotic drugs, initial encounter: Secondary | ICD-10-CM

## 2021-02-12 DIAGNOSIS — C3491 Malignant neoplasm of unspecified part of right bronchus or lung: Secondary | ICD-10-CM | POA: Diagnosis not present

## 2021-02-12 DIAGNOSIS — F5104 Psychophysiologic insomnia: Secondary | ICD-10-CM

## 2021-02-12 DIAGNOSIS — F419 Anxiety disorder, unspecified: Secondary | ICD-10-CM | POA: Diagnosis not present

## 2021-02-12 DIAGNOSIS — K219 Gastro-esophageal reflux disease without esophagitis: Secondary | ICD-10-CM | POA: Insufficient documentation

## 2021-02-12 DIAGNOSIS — R1310 Dysphagia, unspecified: Secondary | ICD-10-CM

## 2021-02-12 DIAGNOSIS — M791 Myalgia, unspecified site: Secondary | ICD-10-CM

## 2021-02-12 MED ORDER — ROSUVASTATIN CALCIUM 10 MG PO TABS: 10.0000 mg | ORAL_TABLET | Freq: Every day | ORAL | 3 refills | Status: DC

## 2021-02-12 MED ORDER — ESOMEPRAZOLE MAGNESIUM 40 MG PO CPDR: 40.0000 mg | DELAYED_RELEASE_CAPSULE | Freq: Every day | ORAL | 3 refills | Status: DC

## 2021-02-12 MED ORDER — TEMAZEPAM 15 MG PO CAPS: 15.0000 mg | ORAL_CAPSULE | Freq: Every evening | ORAL | 2 refills | Status: DC | PRN

## 2021-02-12 NOTE — Telephone Encounter (Signed)
Pt wants to talk to oncology about this first to see if its best that she gets it and then will give [place] a call back

## 2021-02-12 NOTE — Progress Notes (Signed)
Subjective:  Katrina Campbell is a 65 y.o. female who presents for Chief Complaint  Patient presents with   med check    Med check. [facility]- tamzepam declines flu shot today     Here for med check.  I saw her as a new patient May 2022.  After her last visit in June, her chest scan did show a lung cancer.  She has since been seeing oncology and started therapy for this.  She is having a rough time with therapy.  She had to have radiation therapy daily for 7 weeks.  She is having some trouble with swallowing and feels like food gets stuck in her chest despite Nexium.  She is on 40 mg of Nexium over-the-counter, but this is expensive.  she wants a prescription for this.   Regarding chronic insomnia and sleep-the temazepam works well most of the time however she has had a hard time getting this as some pharmacies do not have it in [place].  Wants it sent to local pharmacy  She has ongoing back pain and oncology has upcoming chest can order for her  She is taking Crestor 10 mg daily.  She did not tolerate 20 mg due to myalgias.  She want to leave it at 10 mg despite known CAD and atherosclerosis on scan  Recent months it has been a roller coaster dealing with the stress of lung cancer diagnosis and treatment.  She had to have radiation therapy daily for several weeks which was very difficult.  She is now having trouble swallowing and feels like food gets stuck in her chest.  She is taking Nexium twice daily per oncology recommendations.  She has COPD.  She recently was tried on Breztri inhaler but did not tolerate this immediately.  She stopped this and switch back to Anoro.  She actually felt like she stopped breathing or could not breathe on that medication.  She is compliant with vitamin D supplement over-the-counter  No other aggravating or relieving factors.    No other c/o.  Past Medical History:  Diagnosis Date   Anxiety    COPD (chronic obstructive pulmonary disease) ([place])     Coronary artery disease    Dental staining 07/15/2019   Depression    History of radiation therapy    Right lung- 11/03/20-12/15/20- Dr. 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     albuterol (PROVENTIL) (2.5 MG/3ML) 0.083% nebulizer solution Take 3 mLs (2.5 mg total) by nebulization every 6 (six) hours as needed for wheezing or shortness of breath. (Patient taking differently: Take 2.5 mg by nebulization 2 (two) times daily.) 540 mL 3   aspirin EC 81 MG tablet Take 81 mg by mouth daily with lunch.      buPROPion (WELLBUTRIN XL) 300 MG 24 hr tablet Take 1 tablet (300 mg total) by mouth daily. 90 tablet 3   Cholecalciferol (DIALYVITE VITAMIN D 5000) 125 MCG (5000 UT) capsule Take 5,000 Units by mouth daily.     fluticasone (FLONASE) 50 MCG/ACT nasal spray Place 1 spray into both nostrils as needed for allergies or rhinitis.     Guaifenesin (MUCINEX MAXIMUM STRENGTH) 1200  MG TB12 Take 1,200 mg by mouth daily.     Magnesium 500 MG TABS Take 500 mg by mouth daily with lunch.     Multiple Vitamins-Minerals (IMMUNE SUPPORT PO) Take 1 tablet by mouth daily.     predniSONE (DELTASONE) 20 MG tablet Take 2 tablets (40 mg total) by mouth daily with breakfast. 10 tablet 0   sucralfate (CARAFATE) 1 g tablet Take 1 tablet (1 g total) by mouth 4 (four) times daily -  with meals and at bedtime. Crush and dissolve in 10 mL of warm water prior to swallowing (Patient taking differently: Take 1 g by mouth daily. Crush and dissolve in 10 mL of warm water prior to swallowing) 120 tablet 1   umeclidinium-vilanterol (ANORO ELLIPTA) 62.5-25 MCG/INH AEPB Inhale 1 puff into the lungs daily. 180 each 3    vitamin B-12 (CYANOCOBALAMIN) 1000 MCG tablet Take 1,000 mcg by mouth daily.     Respiratory Therapy Supplies (FLUTTER) DEVI Use as directed 1 each 0   Current Facility-Administered Medications on File Prior to Visit  Medication Dose Route Frequency Provider Last Rate Last Admin   Sonafine emulsion 1 application  1 application Topical Once Gery Pray, MD        The following portions of the patient's history were reviewed and updated as appropriate: allergies, current medications, past family history, past medical history, past social history, past surgical history and problem list.  ROS Otherwise as in subjective above    Objective: BP 122/72   Pulse 99   Wt 132 lb 9.6 oz (60.1 kg)   BMI 22.07 kg/m   Wt Readings from Last 3 Encounters:  02/12/21 132 lb 9.6 oz (60.1 kg)  02/10/21 134 lb 9.6 oz (61.1 kg)  02/09/21 130 lb 9.6 oz (59.2 kg)   General appearance: alert, no distress, well developed, well nourished Psych: Pleasant, answers questions appropriately    Assessment: Encounter Diagnoses  Name Primary?   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  She has since been seeing oncology and started therapy for this.  She is having a rough time with therapy.  She had to have radiation therapy daily for 7 weeks.  She is having some trouble with swallowing and feels like food gets stuck in her chest despite Nexium.  She has other evaluation for swallow issues next week.  She is seeing oncology for cancer therapy.  She has considered  counseling that has too much going on right now to even have time for counseling.  Her husband is a good support system and helping out around the house.  Chronic insomnia-seems to do well on temazepam but had a hard time getting this through mail order pharmacy.  We sent to local pharmacy today.  Hyperlipidemia but myalgia due to statin-she does not seem to tolerate doses that are higher.  We discussed that her scans from a few months ago does show CAD and atherosclerosis.  Ideally should be on a higher dose statin, but she is not  tolerating this.  She is tolerating Crestor 10 and will stay at this dose for now.  Vitamin D deficiency-continue supplement over-the-counter  COPD-she had side effect from new start on Breztri that was started recently.  She called the office and they had her stop that medicine.  She started back on Anoro which she tolerates pretty well.  Uses albuterol as needed  GERD, swallow issues, heartburn-refilled Nexium.  She was recently put on 40 mg or 2 over-the-counter tablets daily by oncology given some issues she is having with swallowing.  This is working better.  Refilled prescription dose today.  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  She has follow-up planned with infectious disease.  She sees pulmonology and oncology as well   [place] was seen today for med check.  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 -     temazepam (RESTORIL) 15 MG capsule; Take 1 capsule (15 mg total) by mouth at bedtime as needed. -     esomeprazole (NEXIUM) 40 MG capsule; Take 1 capsule (40 mg total) by mouth daily.   Follow up:  4 months

## 2021-02-19 ENCOUNTER — Ambulatory Visit (HOSPITAL_COMMUNITY)
Admission: RE | Admit: 2021-02-19 | Discharge: 2021-02-19 | Disposition: A | Discharge status: Home / Self Care | Payer: Medicare Other | Source: Ambulatory Visit | Attending: Pulmonary Disease | Admitting: Pulmonary Disease

## 2021-02-19 DIAGNOSIS — R0602 Shortness of breath: Secondary | ICD-10-CM | POA: Diagnosis present

## 2021-02-20 ENCOUNTER — Telehealth: Payer: Self-pay

## 2021-02-20 NOTE — Telephone Encounter (Signed)
P.A. ESOMEPRAZOLE °

## 2021-02-22 NOTE — Telephone Encounter (Signed)
P.A. Esomeprazole completed

## 2021-02-22 NOTE — Telephone Encounter (Signed)
Pt has tried famotidine and pantoprazole. Insurance also requires trial of omeprazole.  Called pt and she has tried OTC Prilosec and it didn't work.  Completed P.A.

## 2021-02-23 ENCOUNTER — Other Ambulatory Visit: Payer: Self-pay

## 2021-02-23 ENCOUNTER — Encounter: Payer: Self-pay | Admitting: Infectious Diseases

## 2021-02-23 ENCOUNTER — Ambulatory Visit (INDEPENDENT_AMBULATORY_CARE_PROVIDER_SITE_OTHER): Payer: Medicare Other | Admitting: Infectious Diseases

## 2021-02-23 VITALS — BP 147/89 | HR 95 | Temp 98.6°F | Wt 131.0 lb

## 2021-02-23 DIAGNOSIS — A310 Pulmonary mycobacterial infection: Secondary | ICD-10-CM | POA: Diagnosis present

## 2021-02-23 MED ORDER — AMOXICILLIN-POT CLAVULANATE 875-125 MG PO TABS: 1.0000 | ORAL_TABLET | Freq: Two times a day (BID) | ORAL | 1 refills | Status: DC

## 2021-02-23 NOTE — Progress Notes (Signed)
[facility] Infect[facility]                                                             [place], [place], [place], [address]                                                                  Phn. [phone]; Fax: [phone]                                                                             Date: 02/23/21  Reason for Referral: Pulmonary MAC Requesting  Provider: June Leap  Assessment Stage IIIA non-small cell lung cancer, squamous cell carcinoma diagnosed in July 2022: s/p chemo and radiation, ongoing Immunotherapy. 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 Pulmonary MAC infection is a slow and indolent process. She has a h/o extensive smoking in the past with COPD with recently diagnosed lung malignancy for which she has received chemoradiation and is on active immunotherapy and likely contributing to her worsening symptoms. I   also consider potential acute bronchitis as a possible consideration. She is on steroids. She had a BAL cx that grew Neisseria sicca on 01/14/21. I will give her 7 day course of Augmentin trial.  Discussed at length with patient regarding the diagnostic criteria, pathogenesis and treatment considerations  of Pulmonary MAC Infection including  associated complications. Mutually decided on holding off restarting treatment for now and monitor off treatment  Will given 3 cups to collect sputum sample for AFB smear and cultures,if possible early morning   I will follow up in 3-4 months  Follow up with pulmonary as instructed   All questions and concerns were discussed and addressed. Patient verbalized understanding of the plan. 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 She was having  chronic cough at that time with other possible consideration for COPD, lung nodules and ? GERD for her cough. BAL 01/29/2019 AFB smear negative but cx grew MAC. She was eventually started on triple therapy three times a week Azithromycin 572m, Rifampin 6066mand ethambutol 160060mon 02/25/2019,. She was changed from three times a week to daily regimen on 04/15/2019 given no significant improvement in cough which reportedly improved the cough.  Treatment course complicated by dental staining with Rifampin  which was stopped around 07/2019 with a plan to switch to clofazimine. Patient did not want to take clofazimine and continued taking Rifampin and eventually switched to Clofazimine on 11/26/2019. Cough had become worse since switch to clofazimine and patient was referred to Dr StoLorenda Cahillr 2nd opinion. Seen by Dr StoLorenda Cahill 01/13/20. Dr StoLorenda Cahills not entirely convinced about Pulmonary MAC infection and stopped clofazimine, resumed rifampin, decreased dose of azithromycin from 500m60m daily to 250mg55mdaily and continued on  ethambutol. Also referred to pulmonary rehab and gave additional cups for sputum AFB cultures. She was last seen by dr Van DTommy Medal/24/22 when she had improvement in cough but chronic nausea. She had already stopped taking anti mycobacterial abtx at that time ( she tells me she stopped in Nov 2021). It was planned to monitor off tx at that visit. Patient wants to see a different provider this time.   Unfortunately, she was diagnosed with squamous cell lung ca in July 2022 s/p chemoradiation and is undergoing immunotherapy with Dr MohamJulien Nordmannre is a plan for repeat CT chest after # cycle 3 and possible consideration for systemic chemotherapy and radiation if disease progression or no disease improvement  She has worsening SOB for the last 2 weeks. Feels like there is a lemon inside the throat and she is having hard time breathing. She can't take a deep breath. Prior to 2 weeks, she was able to walk in the tread mill for 30 minutes without any issues on a regular basis. She denies coughing and denies producing phlegm. Denies chest pain. She thinks her SOB has acutely changed in the last 2 weeks and her breathing was relatively stable prior to that. At home, her husband does cooking, she helps with dishes and cleaning house but has to take small breaks in between. She does not walk much/climb stairs.   Denies fevers, chills and sweats. Appetite is good and tells she has gained 4 lbs.  Denies nausea, vomiting, abdominal pain and diarrhea Denies any GU symptoms Denies any headache and blurry vision.   Ex , smoker, quit in 2014. Used to smoke 1.5 pack of cigarettes for 35 years. Used to drink alcohol socially which she has quit 6 years ago. Denies IVDU. Lives with her husband. She used to do an office work previously. She used to have dog before, denies contact with farm animals. Denies travel out of the country and has not traveled any where that she can think of except to beaches.    ROS: Constitutional:  Negative for fever, chills,  appetite change, fatigue + and unexpected weight change.  Respiratory: Negative for cough, difficulty breathing + Cardiovascular: Negative for chest pain, palpitations and leg swelling.  Gastrointestinal: Negative for nausea, vomiting, abdominal pain, diarrhea/constipation, .  Genitourinary: Negative for dysuria, hematuria, flank pain Musculoskeletal: Negative for myalgias, arthralgia,  joint swelling, arthralgias, chronic back pain + Skin: Negative for rashes, lesions  Neurological: Negative for weakness, dizziness or headache  Past Medical History:  Diagnosis Date  Anxiety    COPD (chronic obstructive pulmonary disease) (HCC)    Coronary artery disease    Dental staining 07/15/2019   Depression    History of radiation therapy    Right lung- 11/03/20-12/15/20- Dr. Gery Pray   Hyperlipidemia    Labile hypertension    Myocardial infarction [facility]    Pneumonia    Pulmonary mycobacterial infection ([place]) 10/24/2018   Past Surgical History:  Procedure Laterality Date   BIOPSY  01/14/2021   Procedure: BIOPSY;  Surgeon: Garner [place], DO;  Location: [place] [facility];  Service: Pulmonary;;   BRONCHIAL BIOPSY  10/08/2020   Procedure: BRONCHIAL BIOPSIES;  Surgeon: Garner [place], DO;  Location: [place] [facility];  Service: Pulmonary;;   BRONCHIAL BRUSHINGS  10/08/2020   Procedure: BRONCHIAL BRUSHINGS;  Surgeon: Garner Nash, DO;  Location: [place] [facility];  Service: Pulmonary;;   BRONCHIAL BRUSHINGS  01/14/2021   Procedure: BRONCHIAL BRUSHINGS;  Surgeon: Garner Nash, DO;  Location: [place] [facility];  Service: Pulmonary;;   BRONCHIAL WASHINGS  10/08/2020   Procedure: BRONCHIAL WASHINGS;  Surgeon: Garner Nash, DO;  Location: [place] [facility];  Service: Pulmonary;;   BRONCHIAL WASHINGS  01/14/2021   Procedure: BRONCHIAL WASHINGS;  Surgeon: Garner Nash, DO;  Location: [place];  Service: Pulmonary;;   FINE NEEDLE ASPIRATION  10/08/2020   Procedure: FINE NEEDLE  ASPIRATION (FNA) LINEAR;  Surgeon: Garner Nash, DO;  Location: [facility];  Service: Pulmonary;;   LEFT HEART CATH AND CORONARY ANGIOGRAPHY N/A 11/07/2017   Procedure: LEFT HEART CATH AND CORONARY ANGIOGRAPHY;  Surgeon: Nelva Bush, MD;  Location: [place] [facility];  Service: Cardiovascular;  Laterality: N/A;   NASAL SINUS SURGERY  1986   VIDEO BRONCHOSCOPY Bilateral 01/29/2019   Procedure: VIDEO BRONCHOSCOPY WITH FLUORO;  Surgeon: Marshell Garfinkel, MD;  Location: [place];  Service: Cardiopulmonary;  Laterality: Bilateral;   VIDEO BRONCHOSCOPY Right 01/14/2021   Procedure: VIDEO BRONCHOSCOPY WITHOUT FLUORO;  Surgeon: Garner [place], DO;  Location: [place];  Service: Pulmonary;  Laterality: Right;  possible cryotherapy   VIDEO BRONCHOSCOPY WITH ENDOBRONCHIAL ULTRASOUND N/A 10/08/2020   Procedure: VIDEO BRONCHOSCOPY WITH ENDOBRONCHIAL ULTRASOUND;  Surgeon: Garner [place], DO;  Location: [place];  Service: Pulmonary;  Laterality: N/A;   Current Outpatient Medications on File Prior to Visit  Medication Sig Dispense Refill   acetaminophen (TYLENOL) 500 MG tablet Take 500 mg by mouth every 6 (six) hours as needed for moderate pain or headache.     albuterol (PROVENTIL) (2.5 MG/3ML) 0.083% nebulizer solution Take 3 mLs (2.5 mg total) by nebulization every 6 (six) hours as needed for wheezing or shortness of breath. (Patient taking differently: Take 2.5 mg by nebulization 2 (two) times daily.) 540 mL 3   aspirin EC 81 MG tablet Take 81 mg by mouth daily with lunch.      buPROPion (WELLBUTRIN XL) 300 MG 24 hr tablet Take 1 tablet (300 mg total) by mouth daily. 90 tablet 3   Cholecalciferol (DIALYVITE VITAMIN D 5000) 125 MCG (5000 UT) capsule Take 5,000 Units by mouth daily.     esomeprazole (NEXIUM) 40 MG capsule Take 1 capsule (40 mg total) by mouth daily. 90 capsule 3   fluticasone (FLONASE) 50 MCG/ACT nasal spray Place 1 spray into both nostrils as needed for allergies or  rhinitis.     Guaifenesin (MUCINEX MAXIMUM STRENGTH) 1200 MG TB12 Take 1,200 mg by mouth daily.     Magnesium 500 MG TABS Take 500 mg by mouth daily with lunch.  Multiple Vitamins-Minerals (IMMUNE SUPPORT PO) Take 1 tablet by mouth daily.     predniSONE (DELTASONE) 20 MG tablet Take 2 tablets (40 mg total) by mouth daily with breakfast. 10 tablet 0   Respiratory Therapy Supplies (FLUTTER) DEVI Use as directed 1 each 0   rosuvastatin (CRESTOR) 10 MG tablet Take 1 tablet (10 mg total) by mouth daily. 90 tablet 3   sucralfate (CARAFATE) 1 g tablet Take 1 tablet (1 g total) by mouth 4 (four) times daily -  with meals and at bedtime. Crush and dissolve in 10 mL of warm water prior to swallowing (Patient taking differently: Take 1 g by mouth daily. Crush and dissolve in 10 mL of warm water prior to swallowing) 120 tablet 1   temazepam (RESTORIL) 15 MG capsule Take 1 capsule (15 mg total) by mouth at bedtime as needed. 30 capsule 2   umeclidinium-vilanterol (ANORO ELLIPTA) 62.5-25 MCG/INH AEPB Inhale 1 puff into the lungs daily. 180 each 3   vitamin B-12 (CYANOCOBALAMIN) 1000 MCG tablet Take 1,000 mcg by mouth daily.     Current Facility-Administered Medications on File Prior to Visit  Medication Dose Route Frequency Provider Last Rate Last Admin   Sonafine emulsion 1 application  1 application Topical Once Gery Pray, MD         Allergies  Allergen Reactions   Breztri Aerosphere [Budeson-Glycopyrrol-Formoterol] Shortness Of Breath   Benadryl [Diphenhydramine] Nausea Only   Codeine Other (See Comments)    Causes pt to blackout   Statins     Myalgias at higher doses   Social History   Socioeconomic History   Marital status: Married    Spouse name: Not on file   Number of children: Not on file   Years of education: Not on file   Highest education level: Not on file  Occupational History   Not on file  Tobacco Use   Smoking status: Former    Packs/day: 1.75    Years: 44.00     Pack years: 77.00    Types: Cigarettes    Quit date: 11/09/2012    Years since quitting: 8.2   Smokeless tobacco: Never   Tobacco comments:    vapor  Vaping Use   Vaping Use: Former  Substance and Sexual Activity   Alcohol use: Not Currently    Alcohol/week: 24.0 standard drinks    Types: 24 Cans of beer per week   Drug use: Not Currently   Sexual activity: Not Currently  Other Topics Concern   Not on file  Social History Narrative   Not on file   Social Determinants of Health   Financial Resource Strain: Medium Risk   Difficulty of Paying Living Expenses: Somewhat hard  Food Insecurity: No Food Insecurity   Worried About Charity fundraiser in the Last Year: Never true   Ran Out of Food in the Last Year: Never true  Transportation Needs: No Transportation Needs   Lack of Transportation (Medical): No   Lack of Transportation (Non-Medical): No  Physical Activity: Not on file  Stress: Stress Concern Present   Feeling of Stress : Rather much  Social Connections: Unknown   Frequency of Communication with Friends and Family: More than three times a week   Frequency of Social Gatherings with Friends and Family: More than three times a week   Attends Religious Services: More than 4 times per year   Active Member of [employer] or Organizations: Not on file   Attends Archivist Meetings:  Not on file   Marital Status: Married  Human resources officer Violence: Not on file   Family History  Problem Relation Age of Onset   Heart attack Mother    Emphysema Mother    Congestive Heart Failure Mother    Heart attack Father    Congestive Heart Failure Father    Asthma Other    Lung disease Sister    Lung disease Brother    Colon cancer Neg Hx    Vitals BP (!) 147/89   Pulse 95   Temp 98.6 F (37 C) (Oral)   Wt 131 lb (59.4 kg)   SpO2 95%   BMI 21.80 kg/m    Examination  General - not in acute distress, comfortably sitting in chair HEENT - PEERLA, no pallor and no  icterus Chest - decreased bilateral air entry  CVS- Normal s1s2, RRR Abdomen - Soft, Non tender , non distended Ext- no pedal edema Neuro: grossly normal Psych : calm and cooperative   Recent labs CBC Latest Ref Rng & Units 02/09/2021 01/12/2021 12/31/2020  WBC 4.0 - 10.5 K/uL 4.4 3.9(L) 2.2(L)  Hemoglobin 12.0 - 15.0 g/dL 12.1 12.3 10.9(L)  Hematocrit 36.0 - 46.0 % 35.6(L) 36.3 32.2(L)  Platelets 150 - 400 K/uL 105(L) 141(L) 136(L)   CMP Latest Ref Rng & Units 02/09/2021 01/12/2021 12/31/2020  Glucose 70 - 99 mg/dL 101(H) 100(H) 93  BUN 8 - 23 mg/dL _0 Creatinine 0.44 - 1.00 mg/dL 0.74 0.76 0.77  Sodium 135 - 145 mmol/L 140 141 142  Potassium 3.5 - 5.1 mmol/L 4.0 4.0 4.3  Chloride 98 - 111 mmol/L 107 108 108  CO2 22 - 32 mmol/L _1 Calcium 8.9 - 10.3 mg/dL 8.8(L) 9.2 9.1  Total Protein 6.5 - 8.1 g/dL 6.6 6.9 6.6  Total Bilirubin 0.3 - 1.2 mg/dL 0.3 0.5 0.2(L)  Alkaline Phos 38 - 126 U/L 103 97 103  AST 15 - 41 U/L _2 ALT 0 - 44 U/L 28 27 33    Pertinent Microbiology Results for orders placed or performed during the hospital encounter of 01/14/21  Fungus Culture With Stain     Status: None   Collection Time: 01/14/21  8:00 AM   Specimen: Bronchial Alveolar Lavage; Respiratory  Result Value Ref Range Status   Fungus Stain Final report  Final   Fungus (Mycology) Culture Final report  Final    Comment: (NOTE) Performed At: [facility] [place], [place] [id number] Rush Farmer MD YI:[id number]    Fungal Source [facility] A  Final    Comment: Performed at [facility], [place] [address] [place], [place] [address]  Culture, BAL-quantitative w Gram Stain     Status: Abnormal   Collection Time: 01/14/21  8:00 AM   Specimen: Bronchial Alveolar Lavage; Respiratory  Result Value Ref Range Status   Specimen Description BRONCHIAL ALVEOLAR LAVAGE  Final   Special Requests R MAIN STEM SPEC A  Final   Gram Stain   Final    ABUNDANT WBC  PRESENT, PREDOMINANTLY PMN NO ORGANISMS SEEN    Culture (A)  Final    40,000 COLONIES/mL NEISSERIA SICCA Standardized susceptibility testing for this organism is not available. Performed at [facility], [place] [address], [place], [place] [address]    Report Status 01/18/2021 FINAL  Final  Acid Fast Culture with reflexed sensitivities     Status: Abnormal   Collection Time: 01/14/21  8:00 AM  Specimen: Bronchial Alveolar Lavage; Respiratory  Result Value Ref Range Status   Acid Fast Culture Positive (A)  Final    Comment: (NOTE) Acid-fast bacilli have been detected in culture at 2 weeks; see AFB Organism ID by DNA probe Performed At: [facility] [place], [place] [id number] Rush Farmer MD [id number]    Source of Sample BRONCHIAL ALVEOLAR LAVAGE  Final    Comment: Performed at [facility], [place] [address] [place], [place] [address]  Acid Fast Smear (AFB)     Status: None   Collection Time: 01/14/21  8:00 AM   Specimen: Bronchial Alveolar Lavage; Respiratory  Result Value Ref Range Status   AFB Specimen Processing Concentration  Final   Acid Fast Smear Negative  Final    Comment: (NOTE) Performed At: [facility] [place], [place] [id number] Rush Farmer MD FO:[id number]    Source (AFB) R MAIN STEM  Final    Comment: Performed at [facility], [place] [address] [place], [place] [address]  Anaerobic culture w Gram Stain     Status: None   Collection Time: 01/14/21  8:00 AM   Specimen: Bronchoalveolar Lavage  Result Value Ref Range Status   Specimen Description BRONCHIAL ALVEOLAR LAVAGE  Final   Special Requests R MAIN STEM SPEC A  Final   Gram Stain   Final    RARE WBC PRESENT,BOTH PMN AND MONONUCLEAR NO ORGANISMS SEEN    Culture   Final    NO ANAEROBES ISOLATED Performed at [facility], [place] [address], [place], [place] [address]    Report Status 01/19/2021 FINAL  Final  Fungus Culture Result      Status: None   Collection Time: 01/14/21  8:00 AM  Result Value Ref Range Status   Result 1 Comment  Final    Comment: (NOTE) KOH/Calcofluor preparation:  no fungus observed. Performed At: [facility] [place], [place] [id number] Rush Farmer MD [id number]   AFB Organism ID By DNA Probe     Status: Abnormal   Collection Time: 01/14/21  8:00 AM  Result Value Ref Range Status   M tuberculosis complex Negative  Final   M avium complex Positive (A)  Final    Comment: (NOTE) Reporte dto Camilia M  Faxed to [phone] 02/02/21 1011 hrs MWT    M kansasii Not Indicated  Final   M gordonae Not Indicated  Final   Other: NAIDN  Final    Comment: No additional identification testing is necessary.   Susceptibility Testing See reflex.  Final    Comment: (NOTE) Performed At: [facility] [place], [place] [id number] Rush Farmer MD RF:[id number]   Fungal organism reflex     Status: None   Collection Time: 01/14/21  8:00 AM  Result Value Ref Range Status   Fungal result 1 Comment  Final    Comment: (NOTE) No yeast or mold isolated after 4 weeks. Performed At: [facility] [place], [place] [id number] Rush Farmer MD [id number]   MAC Susceptibility Broth     Status: Abnormal   Collection Time: 01/14/21  8:00 AM  Result Value Ref Range Status   Organism ID Comment (A)  Final    Comment: Mycobacterium avium complex   Amikacin Comment  Final    Comment: (NOTE) 16.0 ug/mL Susceptible Breakpoints have been established for only some organism-drug combinations as indicated. This test was developed and its performance characteristics  determined by [facility]. It has not been cleared or approved by the Food and Drug Administration.    Ciprofloxacin >8.0 ug/mL  Final    Comment: (NOTE) Breakpoints have been established for only some organism-drug combinations as indicated. This test was developed and its  performance characteristics determined by [facility]. It has not been cleared or approved by the Food and Drug Administration.    Clarithromycin 2.0 ug/mL Susceptible  Final    Comment: (NOTE) Breakpoints have been established for only some organism-drug combinations as indicated. This test was developed and its performance characteristics determined by [facility]. It has not been cleared or approved by the Food and Drug Administration.    Linezolid 32.0 ug/mL Resistant  Final    Comment: (NOTE) Breakpoints have been established for only some organism-drug combinations as indicated. This test was developed and its performance characteristics determined by [facility]. It has not been cleared or approved by the Food and Drug Administration.    Moxifloxacin >4.0 ug/mL Resistant  Final    Comment: (NOTE) Breakpoints have been established for only some organism-drug combinations as indicated. This test was developed and its performance characteristics determined by [facility]. It has not been cleared or approved by the Food and Drug Administration.    Rifampin >4.0 ug/mL  Final    Comment: (NOTE) Breakpoints have been established for only some organism-drug combinations as indicated. This test was developed and its performance characteristics determined by [facility]. It has not been cleared or approved by the Food and Drug Administration.    Streptomycin >32.0 ug/mL  Final    Comment: (NOTE) Breakpoints have been established for only some organism-drug combinations as indicated. This test was developed and its performance characteristics determined by [facility]. It has not been cleared or approved by the Food and Drug Administration.      Pertinent Imaging DG Chest 2 View  Result Date: 02/10/2021 CLINICAL DATA:  Tachypnea.  Previous right lung carcinoma. EXAM: CHEST - 2 VIEW COMPARISON:  01/18/2021 FINDINGS: Heart size remains normal. Pulmonary hyperinflation is again seen, consistent  with COPD. There has been progressive right upper lobe volume loss and scarring since previous study. Mild right hilar soft tissue fullness remains stable. Left lung remains clear. No evidence of pleural effusion. IMPRESSION: Progressive right upper lobe volume loss and scarring since previous study. Stable right hilar soft tissue prominence. Consider chest CT with contrast for further evaluation. COPD. Electronically Signed   By: Marlaine Hind M.D.   On: 02/10/2021 08:45   CT CHEST HIGH RESOLUTION  Result Date: 02/19/2021 CLINICAL DATA:  65 year old female with history of lung cancer status post completion of chemotherapy, recent initiation of immunotherapy. EXAM: CT CHEST WITHOUT CONTRAST TECHNIQUE: Multidetector CT imaging of the chest was performed following the standard protocol without intravenous contrast. High resolution imaging of the lungs, as well as inspiratory and expiratory imaging, was performed. COMPARISON:  Chest CT 12/31/2020. FINDINGS: Cardiovascular: Heart size is normal. There is no significant pericardial fluid, thickening or pericardial calcification. There is aortic atherosclerosis, as well as atherosclerosis of the great vessels of the mediastinum and the coronary arteries, including calcified atherosclerotic plaque in the left main, left anterior descending, left circumflex and right coronary arteries. Ectasia of ascending thoracic aorta (4.2 cm in diameter). Mediastinum/Nodes: No pathologically enlarged mediastinal or hilar lymph nodes. Esophagus is unremarkable in appearance. No axillary lymphadenopathy. Lungs/Pleura: Compared to the prior examination there has been substantial right upper lobe volume loss, which is now a centrally completely collapsed, with compensatory hyperexpansion of  the right middle lobe. There is also some developing septal thickening and reticulation in the paramediastinal aspect of both lungs, likely evolving radiation changes. No new suspicious appearing  pulmonary nodules or masses are noted. No acute consolidative airspace disease. No pleural effusions. Diffuse bronchial wall thickening with moderate centrilobular and paraseptal emphysema. Upper Abdomen: Aortic atherosclerosis. Musculoskeletal: There are no aggressive appearing lytic or blastic lesions noted in the visualized portions of the skeleton. IMPRESSION: 1. Interval collapse of the right upper lobe. Evolving postradiation changes in the paramediastinal aspect of both lungs. Node definite imaging findings suspicious for residual/recurrent or metastatic disease in the thorax. 2. Diffuse bronchial wall thickening with moderate centrilobular and paraseptal emphysema; imaging findings suggestive of underlying COPD. 3. Aortic atherosclerosis, in addition to left main and 3 vessel coronary artery disease. Please note that although the presence of coronary artery calcium documents the presence of coronary artery disease, the severity of this disease and any potential stenosis cannot be assessed on this non-gated CT examination. Assessment for potential risk factor modification, dietary therapy or pharmacologic therapy may be warranted, if clinically indicated. 4. Ectasia of ascending thoracic aorta (4.2 cm in diameter). Attention on routine follow-up imaging examinations is recommended to ensure continued stability. Aortic Atherosclerosis (ICD10-I70.0) and Emphysema (ICD10-J43.9). Electronically Signed   By: Vinnie Langton M.D.   On: 02/19/2021 16:37    All pertinent labs/Imagings/notes reviewed. All pertinent plain films and CT images have been personally visualized and interpreted; radiology reports have been reviewed. Decision making incorporated into the Impression / Recommendations.  I have spent a total of 80 minutes of face-to-face and non-face-to-face time, excluding clinical staff time, preparing to see patient, ordering tests and/or medications, and provide counseling the patient     Electronically signed by:  Rosiland Oz, MD Infectious Disease Physician [facility] [address] [place], [place] [address] Phone: [phone]  Fax: [phone]

## 2021-02-25 NOTE — Telephone Encounter (Signed)
P.A. approved, pt informed, she calling pharmacy

## 2021-03-03 ENCOUNTER — Ambulatory Visit (INDEPENDENT_AMBULATORY_CARE_PROVIDER_SITE_OTHER): Payer: Medicare Other | Admitting: Pulmonary Disease

## 2021-03-03 ENCOUNTER — Ambulatory Visit: Payer: Medicare Other

## 2021-03-03 ENCOUNTER — Other Ambulatory Visit: Payer: Self-pay

## 2021-03-03 ENCOUNTER — Telehealth: Payer: Self-pay | Admitting: Pulmonary Disease

## 2021-03-03 ENCOUNTER — Encounter: Payer: Self-pay | Admitting: Pulmonary Disease

## 2021-03-03 VITALS — BP 126/80 | HR 90 | Temp 98.2°F | Ht 64.0 in | Wt 131.0 lb

## 2021-03-03 DIAGNOSIS — R0602 Shortness of breath: Secondary | ICD-10-CM | POA: Diagnosis not present

## 2021-03-03 DIAGNOSIS — C3491 Malignant neoplasm of unspecified part of right bronchus or lung: Secondary | ICD-10-CM | POA: Diagnosis not present

## 2021-03-03 DIAGNOSIS — J479 Bronchiectasis, uncomplicated: Secondary | ICD-10-CM | POA: Diagnosis not present

## 2021-03-03 DIAGNOSIS — R590 Localized enlarged lymph nodes: Secondary | ICD-10-CM

## 2021-03-03 DIAGNOSIS — R1310 Dysphagia, unspecified: Secondary | ICD-10-CM

## 2021-03-03 DIAGNOSIS — J9819 Other pulmonary collapse: Secondary | ICD-10-CM

## 2021-03-03 DIAGNOSIS — J449 Chronic obstructive pulmonary disease, unspecified: Secondary | ICD-10-CM

## 2021-03-03 MED ORDER — TRELEGY ELLIPTA 100-62.5-25 MCG/ACT IN AEPB: 1.0000 | INHALATION_SPRAY | Freq: Every day | RESPIRATORY_TRACT | 3 refills | Status: DC

## 2021-03-03 MED ORDER — TRELEGY ELLIPTA 100-62.5-25 MCG/ACT IN AEPB: 1.0000 | INHALATION_SPRAY | Freq: Every day | RESPIRATORY_TRACT | 0 refills | Status: DC

## 2021-03-03 NOTE — Telephone Encounter (Signed)
Called and spoke with patient to let her know that new [facility] has [facility]. She expressed understanding. Nothing further needed at this time.

## 2021-03-03 NOTE — Patient Instructions (Addendum)
Thank you for visiting Dr. Valeta Harms at [facility]. Today we recommend the following:  Orders Placed This Encounter  Procedures   DG ESOPHAGUS W SINGLE CM (SOL OR THIN BA)   DG Chest 2 View   Ambulatory referral to Gastroenterology   Trelegy samples and new prescription  Return in about 6 weeks (around 04/14/2021) for with APP or Dr. Valeta Harms.    Please do your part to reduce the spread of COVID-19.

## 2021-03-03 NOTE — Progress Notes (Signed)
Synopsis: Referred in January 2021 for former smoker quit 2014 moderate COPD, MAI by Carlena Hurl, PA-C  Subjective:   PATIENT ID: Katrina Campbell GENDER: female DOB: 1955-05-29, MRN: [id number]  Chief Complaint  Patient presents with   Follow-up    Patient says she's still having shortness of breath     [name] moderate COPD, former smoker quit 2014, MAI patient last seen in the office by Dr. Vaughan Browner.  Former Dr. Lenna Gilford patient.  Treated for stenotrophomonas in the past.  Prior 3 sputum's AFB, 1/3+ for MAI.  Saw infectious disease to discuss initiation of therapy.  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  Patient was last seen by infectious disease on 04/15/2019.  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  Just started on azithromycin 500 mg daily, rifampin 600 mg daily plus ethambutol 900 mg daily.  Patient does have CT imaging with lower lobe bronchiectasis.  Patient has chronic sputum production.  She is short of breath with daily cough and sputum production.  During this time she is very anxious and hesitant about going out in public due to her chronic cough.  She denies hemoptysis patient's weight has also been stable.  OV 11/06/2019: around the 4th of July patient had congestion and cough that still hasnt really gone away.  Overall doing much better now.  Has less cough and congestion.  Recent follow-up with infectious disease plan for Monday.  Been compliant with her medications.  Still has some daily sputum production.  Rarely has a streak of blood with cough.  OV 04/14/2020: Here today for follow-up.  Establish care with me back last year.  Was already under treatment for her MAI.  Followed up with infectious disease here in [place] as well as met with ID at [facility].  Decision was made to come off of treatments.   Recent sputum's have been negative.  She does have significant emphysema currently compliant with inhaler regimen.  From a respiratory standpoint she is doing fine.  No significant symptom change after stopping antibiotics.   OV 10/01/2020: Here today for follow-up had abnormal lung cancer screening CT follow-up with an enlarged mediastinal node.  Patient was sent for nuclear medicine PET scan.  PET scan revealed a significantly PET avid station 7 subcarinal node with SUV of 11.  We reviewed this today in the office.  Concerning for underlying malignancy.  OV 10/09/2020: Here today for follow-up.  Recent CT scan of the chest had enlarged mediastinal node.  PET scan with PET avid station 7 and irregular lining of the right mainstem.  Patient was taken for video bronchoscopy on 10/08/2020.  Bronchoscopy revealed enlarged subcarinal adenopathy which was sampled under ultrasound guidance.  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  Endobronchial brushings, forcep biopsies of the abnormal mucosa and visible tumor were taken.  OV 02/10/2021: having difficulty breathing, shes is more short of breath.  She was taken for bronchoscopy recently with repeat biopsy which did show persistence of her malignancy.  Her culture results also were positive again for MAI.  I did reach out to infectious disease today for her symptoms should consider treatment options again.  Especially in lieu of of her immune suppression receiving intermittent therapy.  OV 03/03/2021: having lots of difficulty sob. Also she feels like  food sometimes gets stuck with swallowing.  Overall feels anxious that her shortness of breath is not getting much better.  Saw infectious disease in, and once again and agreed to continue to hold therapy for MAI.  I agree she does not have a lot of radiographic evidence of MAI.  She still has significant amount of sputum  production.  She has not been using her vest therapy too much because it worsens back pain.  Trying to use her flutter valve.     Past Medical History:  Diagnosis Date   Anxiety    COPD (chronic obstructive pulmonary disease) ([place])    Coronary artery disease    Dental staining 07/15/2019   Depression    History of radiation therapy    Right lung- 11/03/20-12/15/20- Dr. Gery Pray   Hyperlipidemia    Labile hypertension    Myocardial infarction [facility])    Pneumonia    Pulmonary mycobacterial infection ([place]) 10/24/2018     Family History  Problem Relation Age of Onset   Heart attack Mother    Emphysema Mother    Congestive Heart Failure Mother    Heart attack Father    Congestive Heart Failure Father    Asthma Other    Lung disease Sister    Lung disease Brother    Colon cancer Neg Hx      Past Surgical History:  Procedure Laterality Date   BIOPSY  01/14/2021   Procedure: BIOPSY;  Surgeon: Garner Nash, DO;  Location: [place] [facility];  Service: Pulmonary;;   BRONCHIAL BIOPSY  10/08/2020   Procedure: BRONCHIAL BIOPSIES;  Surgeon: Garner Nash, DO;  Location: [place] [facility];  Service: Pulmonary;;   BRONCHIAL BRUSHINGS  10/08/2020   Procedure: BRONCHIAL BRUSHINGS;  Surgeon: Garner Nash, DO;  Location: [place] [facility];  Service: Pulmonary;;   BRONCHIAL BRUSHINGS  01/14/2021   Procedure: BRONCHIAL BRUSHINGS;  Surgeon: Garner Nash, DO;  Location: [place] [facility];  Service: Pulmonary;;   BRONCHIAL WASHINGS  10/08/2020   Procedure: BRONCHIAL WASHINGS;  Surgeon: Garner Nash, DO;  Location: [place] [facility];  Service: Pulmonary;;   BRONCHIAL WASHINGS  01/14/2021   Procedure: BRONCHIAL WASHINGS;  Surgeon: Garner Nash, DO;  Location: [place];  Service: Pulmonary;;   FINE NEEDLE ASPIRATION  10/08/2020   Procedure: FINE NEEDLE ASPIRATION (FNA) LINEAR;  Surgeon: Garner Nash, DO;  Location: [place] [facility];  Service: Pulmonary;;   LEFT HEART CATH AND CORONARY ANGIOGRAPHY  N/A 11/07/2017   Procedure: LEFT HEART CATH AND CORONARY ANGIOGRAPHY;  Surgeon: Nelva Bush, MD;  Location: [place] [facility];  Service: Cardiovascular;  Laterality: N/A;   NASAL SINUS SURGERY  1986   VIDEO BRONCHOSCOPY Bilateral 01/29/2019   Procedure: VIDEO BRONCHOSCOPY WITH FLUORO;  Surgeon: Marshell Garfinkel, MD;  Location: [place];  Service: Cardiopulmonary;  Laterality: Bilateral;   VIDEO BRONCHOSCOPY Right 01/14/2021   Procedure: VIDEO BRONCHOSCOPY WITHOUT FLUORO;  Surgeon: Garner Nash, DO;  Location: [place];  Service: Pulmonary;  Laterality: Right;  possible cryotherapy   VIDEO BRONCHOSCOPY WITH ENDOBRONCHIAL ULTRASOUND N/A 10/08/2020   Procedure: VIDEO BRONCHOSCOPY WITH ENDOBRONCHIAL ULTRASOUND;  Surgeon: Garner Nash, DO;  Location: [place];  Service: Pulmonary;  Laterality: N/A;    Social History   Socioeconomic History   Marital status: Married    Spouse name: Not on file   Number of children: Not on file   Years of education: Not on file   Highest education level: Not on file  Occupational History   Not  on file  Tobacco Use   Smoking status: Former    Packs/day: 1.75    Years: 44.00    Pack years: 77.00    Types: Cigarettes    Quit date: 11/09/2012    Years since quitting: 8.3   Smokeless tobacco: Never   Tobacco comments:    vapor  Vaping Use   Vaping Use: Former  Substance and Sexual Activity   Alcohol use: Not Currently    Alcohol/week: 24.0 standard drinks    Types: 24 Cans of beer per week   Drug use: Not Currently   Sexual activity: Not Currently  Other Topics Concern   Not on file  Social History Narrative   Not on file   Social Determinants of Health   Financial Resource Strain: Medium Risk   Difficulty of Paying Living Expenses: Somewhat hard  Food Insecurity: No Food Insecurity   Worried About Charity fundraiser in the Last Year: Never true   Ran Out of Food in the Last Year: Never true  Transportation Needs: No  Transportation Needs   Lack of Transportation (Medical): No   Lack of Transportation (Non-Medical): No  Physical Activity: Not on file  Stress: Stress Concern Present   Feeling of Stress : Rather much  Social Connections: Unknown   Frequency of Communication with Friends and Family: More than three times a week   Frequency of Social Gatherings with Friends and Family: More than three times a week   Attends Religious Services: More than 4 times per year   Active Member of [employer] or Organizations: Not on file   Attends Archivist Meetings: Not on file   Marital Status: Married  Human resources officer Violence: Not on file     Allergies  Allergen Reactions   Data processing manager [Budeson-Glycopyrrol-Formoterol] Shortness Of Breath   Benadryl [Diphenhydramine] Nausea Only   Codeine Other (See Comments)    Causes pt to blackout   Statins     Myalgias at higher doses      Outpatient Medications Prior to Visit  Medication Sig Dispense Refill   acetaminophen (TYLENOL) 500 MG tablet Take 500 mg by mouth every 6 (six) hours as needed for moderate pain or headache.     albuterol (PROVENTIL) (2.5 MG/3ML) 0.083% nebulizer solution Take 3 mLs (2.5 mg total) by nebulization every 6 (six) hours as needed for wheezing or shortness of breath. (Patient taking differently: Take 2.5 mg by nebulization 2 (two) times daily.) 540 mL 3   aspirin EC 81 MG tablet Take 81 mg by mouth daily with lunch.      buPROPion (WELLBUTRIN XL) 300 MG 24 hr tablet Take 1 tablet (300 mg total) by mouth daily. 90 tablet 3   Cholecalciferol (DIALYVITE VITAMIN D 5000) 125 MCG (5000 UT) capsule Take 5,000 Units by mouth daily.     esomeprazole (NEXIUM) 40 MG capsule Take 1 capsule (40 mg total) by mouth daily. 90 capsule 3   fluticasone (FLONASE) 50 MCG/ACT nasal spray Place 1 spray into both nostrils as needed for allergies or rhinitis.     Guaifenesin (MUCINEX MAXIMUM STRENGTH) 1200 MG TB12 Take 1,200 mg by mouth daily.      Magnesium 500 MG TABS Take 500 mg by mouth daily with lunch.     Multiple Vitamins-Minerals (IMMUNE SUPPORT PO) Take 1 tablet by mouth daily.     Respiratory Therapy Supplies (FLUTTER) DEVI Use as directed 1 each 0   rosuvastatin (CRESTOR) 10 MG tablet Take 1 tablet (10  mg total) by mouth daily. 90 tablet 3   temazepam (RESTORIL) 15 MG capsule Take 1 capsule (15 mg total) by mouth at bedtime as needed. 30 capsule 2   vitamin B-12 (CYANOCOBALAMIN) 1000 MCG tablet Take 1,000 mcg by mouth daily.     umeclidinium-vilanterol (ANORO ELLIPTA) 62.5-25 MCG/INH AEPB Inhale 1 puff into the lungs daily. 180 each 3   amoxicillin-clavulanate (AUGMENTIN) 875-125 MG tablet Take 1 tablet by mouth 2 (two) times daily. (Patient not taking: Reported on 03/03/2021) 14 tablet 1   predniSONE (DELTASONE) 20 MG tablet Take 2 tablets (40 mg total) by mouth daily with breakfast. (Patient not taking: Reported on 03/03/2021) 10 tablet 0   sucralfate (CARAFATE) 1 g tablet Take 1 tablet (1 g total) by mouth 4 (four) times daily -  with meals and at bedtime. Crush and dissolve in 10 mL of warm water prior to swallowing (Patient not taking: Reported on 03/03/2021) 120 tablet 1   Facility-Administered Medications Prior to Visit  Medication Dose Route Frequency Provider Last Rate Last Admin   Sonafine emulsion 1 application  1 application Topical Once Gery Pray, MD        Review of Systems  Constitutional:  Negative for chills, fever, malaise/fatigue and weight loss.  HENT:  Negative for hearing loss, sore throat and tinnitus.   Eyes:  Negative for blurred vision and double vision.  Respiratory:  Positive for cough, sputum production and shortness of breath. Negative for hemoptysis, wheezing and stridor.   Cardiovascular:  Negative for chest pain, palpitations, orthopnea, leg swelling and PND.  Gastrointestinal:  Negative for abdominal pain, constipation, diarrhea, heartburn, nausea and vomiting.  Genitourinary:   Negative for dysuria, hematuria and urgency.  Musculoskeletal:  Negative for joint pain and myalgias.  Skin:  Negative for itching and rash.  Neurological:  Negative for dizziness, tingling, weakness and headaches.  Endo/Heme/Allergies:  Negative for environmental allergies. Does not bruise/bleed easily.  Psychiatric/Behavioral:  Negative for depression. The patient is not nervous/anxious and does not have insomnia.   All other systems reviewed and are negative.   Objective:  Physical Exam Vitals reviewed.  Constitutional:      General: She is not in acute distress.    Appearance: She is well-developed.  HENT:     Head: Normocephalic and atraumatic.  Eyes:     General: No scleral icterus.    Conjunctiva/sclera: Conjunctivae normal.     Pupils: Pupils are equal, round, and reactive to light.  Neck:     Vascular: No JVD.     Trachea: No tracheal deviation.  Cardiovascular:     Rate and Rhythm: Normal rate and regular rhythm.     Heart sounds: Normal heart sounds. No murmur heard. Pulmonary:     Effort: Pulmonary effort is normal. No tachypnea, accessory muscle usage or respiratory distress.     Breath sounds: No stridor. No wheezing, rhonchi or rales.  Abdominal:     General: Bowel sounds are normal. There is no distension.     Palpations: Abdomen is soft.     Tenderness: There is no abdominal tenderness.  Musculoskeletal:        General: No tenderness.     Cervical back: Neck supple.  Lymphadenopathy:     Cervical: No cervical adenopathy.  Skin:    General: Skin is warm and dry.     Capillary Refill: Capillary refill takes less than 2 seconds.     Findings: No rash.  Neurological:     Mental Status: She  is alert and oriented to person, place, and time.  Psychiatric:        Behavior: Behavior normal.     Vitals:   03/03/21 0902  BP: 126/80  Pulse: 90  Temp: 98.2 F (36.8 C)  TempSrc: Oral  SpO2: 96%  Weight: 131 lb (59.4 kg)  Height: _0  (1.626 m)    96%  on RA BMI Readings from Last 3 Encounters:  03/03/21 22.49 kg/m  02/23/21 21.80 kg/m  02/12/21 22.07 kg/m   Wt Readings from Last 3 Encounters:  03/03/21 131 lb (59.4 kg)  02/23/21 131 lb (59.4 kg)  02/12/21 132 lb 9.6 oz (60.1 kg)     CBC    Component Value Date/Time   WBC 4.4 02/09/2021 1031   WBC 5.4 05/29/2018 1232   RBC 4.04 02/09/2021 1031   HGB 12.1 02/09/2021 1031   HGB 13.7 09/22/2020 1325   HCT 35.6 (L) 02/09/2021 1031   HCT 41.2 09/22/2020 1325   PLT 105 (L) 02/09/2021 1031   PLT 143 (L) 09/22/2020 1325   MCV 88.1 02/09/2021 1031   MCV 89 09/22/2020 1325   MCH 30.0 02/09/2021 1031   MCHC 34.0 02/09/2021 1031   RDW 13.1 02/09/2021 1031   RDW 12.9 09/22/2020 1325   LYMPHSABS 0.8 02/09/2021 1031   LYMPHSABS 2.1 09/22/2020 1325   MONOABS 0.4 02/09/2021 1031   EOSABS 0.1 02/09/2021 1031   EOSABS 0.1 09/22/2020 1325   BASOSABS 0.0 02/09/2021 1031   BASOSABS 0.0 09/22/2020 1325    Chest Imaging:  May 2020 CT chest: Scattered nodules.  Lower lobe small areas of bronchiectasis. The patient's images have been independently reviewed by me.   PET scan 09/29/2020: SUV of 11, subcarinal adenopathy concerning for malignancy. The patient's images have been independently reviewed by me.    Chest x-ray 02/09/2021 CT chest 02/19/2021: Right upper lobe collapse.  Which is new from previous chest x-ray imaging.  Pulmonary Functions Testing Results: No flowsheet data found.  FeNO: none   Pathology: none   Echocardiogram: none  Heart Catheterization: none     Assessment & Plan:     ICD-10-CM   1. Bronchiectasis without complication ([place])  N36.1     2. NSCLC of right lung ([place])  C34.91     3. Shortness of breath  R06.02 DG Chest 2 View    CANCELED: DG Chest 2 View    4. 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 Lymphadenopathy, mediastinal  R59.0     6. Stage 2 moderate COPD by GOLD classification ([place])  J44.9     7. Dysphagia, unspecified  type  R13.10 Ambulatory referral to Gastroenterology    DG ESOPHAGUS W SINGLE CM (SOL OR THIN BA)    8. Lung collapse  J98.19 CANCELED: DG Chest 2 View       Discussion:  [name], moderate COPD FEV1 of 66%, longstanding of smoking history, was enrolled in lung cancer screening program found to have enlarged subcarinal node was taken for bronchoscopy diagnosed with non-small cell lung cancer started on chemotherapy and radiation.  Subsequent imaging had right middle lobe collapse was taken back for bronchoscopy with no significant endobronchial disease noted.  She had a small amount of submucosal irregularity biopsies confirmed persistence of malignancy.  Repeat CT scan now shows right upper lobe collapse.  She does have ongoing cough shortness of breath and sputum production.  Some of which is related to her COPD also she  has routinely grown out MAI from the airway.  Plan: We will get a repeat two-view chest x-ray to see the right upper lobe distal collapse. If her right upper lobe still collapse we need to consider repeat bronchoscopy at some point. Hopefully the right upper lobe is only collapsed or occluded secondary to sputum production or mucous plugging We will step up her therapy to Trelegy. My prescription and samples given today. As for her dysphagia I will refer to GI for evaluation.  She has had chest radiation and I guess would be at risk for the development of a stricture. Barium esophagram ordered as well.  Return to clinic in approximately 6 weeks to see me or Eric Form in follow-up.   Current Outpatient Medications:    acetaminophen (TYLENOL) 500 MG tablet, Take 500 mg by mouth every 6 (six) hours as needed for moderate pain or headache., Disp: , Rfl:    albuterol (PROVENTIL) (2.5 MG/3ML) 0.083% nebulizer solution, Take 3 mLs (2.5 mg total) by nebulization every 6 (six) hours as needed for wheezing or shortness of breath. (Patient taking differently:  Take 2.5 mg by nebulization 2 (two) times daily.), Disp: 540 mL, Rfl: 3   aspirin EC 81 MG tablet, Take 81 mg by mouth daily with lunch. , Disp: , Rfl:    buPROPion (WELLBUTRIN XL) 300 MG 24 hr tablet, Take 1 tablet (300 mg total) by mouth daily., Disp: 90 tablet, Rfl: 3   Cholecalciferol (DIALYVITE VITAMIN D 5000) 125 MCG (5000 UT) capsule, Take 5,000 Units by mouth daily., Disp: , Rfl:    esomeprazole (NEXIUM) 40 MG capsule, Take 1 capsule (40 mg total) by mouth daily., Disp: 90 capsule, Rfl: 3   fluticasone (FLONASE) 50 MCG/ACT nasal spray, Place 1 spray into both nostrils as needed for allergies or rhinitis., Disp: , Rfl:    Fluticasone-Umeclidin-Vilant (TRELEGY ELLIPTA) 100-62.5-25 MCG/ACT AEPB, Inhale 1 puff into the lungs daily., Disp: 60 each, Rfl: 0   Guaifenesin (MUCINEX MAXIMUM STRENGTH) 1200 MG TB12, Take 1,200 mg by mouth daily., Disp: , Rfl:    Magnesium 500 MG TABS, Take 500 mg by mouth daily with lunch., Disp: , Rfl:    Multiple Vitamins-Minerals (IMMUNE SUPPORT PO), Take 1 tablet by mouth daily., Disp: , Rfl:    Respiratory Therapy Supplies (FLUTTER) DEVI, Use as directed, Disp: 1 each, Rfl: 0   rosuvastatin (CRESTOR) 10 MG tablet, Take 1 tablet (10 mg total) by mouth daily., Disp: 90 tablet, Rfl: 3   temazepam (RESTORIL) 15 MG capsule, Take 1 capsule (15 mg total) by mouth at bedtime as needed., Disp: 30 capsule, Rfl: 2   vitamin B-12 (CYANOCOBALAMIN) 1000 MCG tablet, Take 1,000 mcg by mouth daily., Disp: , Rfl:    amoxicillin-clavulanate (AUGMENTIN) 875-125 MG tablet, Take 1 tablet by mouth 2 (two) times daily. (Patient not taking: Reported on 03/03/2021), Disp: 14 tablet, Rfl: 1   Fluticasone-Umeclidin-Vilant (TRELEGY ELLIPTA) 100-62.5-25 MCG/ACT AEPB, Inhale 1 puff into the lungs daily., Disp: 180 each, Rfl: 3   predniSONE (DELTASONE) 20 MG tablet, Take 2 tablets (40 mg total) by mouth daily with breakfast. (Patient not taking: Reported on 03/03/2021), Disp: 10 tablet, Rfl: 0    sucralfate (CARAFATE) 1 g tablet, Take 1 tablet (1 g total) by mouth 4 (four) times daily -  with meals and at bedtime. Crush and dissolve in 10 mL of warm water prior to swallowing (Patient not taking: Reported on 03/03/2021), Disp: 120 tablet, Rfl: 1 No current facility-administered medications for this visit.  Facility-Administered Medications Ordered in Other Visits:    Sonafine emulsion 1 application, 1 application, Topical, Once, Gery Pray, MD    Garner Nash, DO [facility] 03/03/2021 7:50 PM

## 2021-03-04 ENCOUNTER — Telehealth: Payer: Self-pay | Admitting: General Surgery

## 2021-03-04 ENCOUNTER — Other Ambulatory Visit: Payer: Medicare Other

## 2021-03-04 DIAGNOSIS — I2510 Atherosclerotic heart disease of native coronary artery without angina pectoris: Secondary | ICD-10-CM

## 2021-03-04 LAB — LIPID PANEL
Chol/HDL Ratio: 3.3 ratio (ref 0.0–4.4)
Cholesterol, Total: 163 mg/dL (ref 100–199)
HDL: 50 mg/dL (ref >39)
LDL Chol Calc (NIH): 91 mg/dL (ref 0–99)
Triglycerides: 122 mg/dL (ref 0–149)
VLDL Cholesterol Cal: 22 mg/dL (ref 5–40)

## 2021-03-04 LAB — ALT: ALT: 26 IU/L (ref 0–32)

## 2021-03-04 NOTE — Telephone Encounter (Signed)
-----   Message from [place], DO sent at 03/03/2021  1:10 PM EST ----- No problem, happy to get her in to the office for evaluation and EGD with probable dilation.  I will look for the esophagram results.  Olivia Mackie, can you please assist in scheduling OV with me.  Please schedule to be done after esophagram so we can review those results together.  Thanks.  VC ----- Message ----- From: Garner Nash, DO Sent: 03/03/2021   9:18 AM EST To: [place], can you see her for dysphagia eval? Thinking she may need a dilation? I am going to send for esophagram prior to seeing you.  Thanks BLI

## 2021-03-04 NOTE — Telephone Encounter (Signed)
Opened in error

## 2021-03-05 ENCOUNTER — Encounter: Payer: Self-pay | Admitting: Physician Assistant

## 2021-03-05 ENCOUNTER — Telehealth: Payer: Self-pay | Admitting: *Deleted

## 2021-03-05 MED ORDER — EZETIMIBE 10 MG PO TABS: 10.0000 mg | ORAL_TABLET | Freq: Every day | ORAL | 1 refills | Status: DC

## 2021-03-05 NOTE — Telephone Encounter (Signed)
-----   Message from Freada Bergeron, MD sent at 03/05/2021  7:19 AM EST ----- Her LDL cholesterol is slightly above goal at 91. Goal is <70. If she is willing, we should increase her crestor to 20mg  daily

## 2021-03-05 NOTE — Telephone Encounter (Signed)
Contacted the patient and scheduled for an office visit 04/07/2021 consult for an egd

## 2021-03-05 NOTE — Telephone Encounter (Signed)
Spoke with the pt about lab results and recommendations per Dr. Johney Frame.  Pt states she tried taking rosuvastatin 20 mg po daily dose and had to go back down to the 10 mg dose, due to leg cramps.  Informed Dr. Johney Frame of this and she now recommends that the pt just continue taking her rosuvastatin 10 mg po daily dose, and we will add zetia 10 mg po daily, to assist with getting her LDL at goal. Confirmed the pharmacy of choice with the pt.  Pt verbalized understanding and agrees with this plan.

## 2021-03-08 ENCOUNTER — Ambulatory Visit
Admission: RE | Admit: 2021-03-08 | Discharge: 2021-03-08 | Disposition: A | Discharge status: Home / Self Care | Payer: Medicare Other | Source: Ambulatory Visit | Attending: Pulmonary Disease | Admitting: Pulmonary Disease

## 2021-03-08 ENCOUNTER — Other Ambulatory Visit: Payer: Self-pay | Admitting: Pulmonary Disease

## 2021-03-08 ENCOUNTER — Encounter: Payer: Self-pay | Admitting: Internal Medicine

## 2021-03-08 DIAGNOSIS — R1310 Dysphagia, unspecified: Secondary | ICD-10-CM

## 2021-03-08 DIAGNOSIS — R0602 Shortness of breath: Secondary | ICD-10-CM

## 2021-03-09 ENCOUNTER — Inpatient Hospital Stay (HOSPITAL_BASED_OUTPATIENT_CLINIC_OR_DEPARTMENT_OTHER): Payer: Medicare Other | Admitting: Internal Medicine

## 2021-03-09 ENCOUNTER — Inpatient Hospital Stay: Payer: Medicare Other

## 2021-03-09 ENCOUNTER — Other Ambulatory Visit: Payer: Self-pay

## 2021-03-09 ENCOUNTER — Inpatient Hospital Stay: Payer: Medicare Other | Attending: Physician Assistant

## 2021-03-09 ENCOUNTER — Encounter: Payer: Self-pay | Admitting: Internal Medicine

## 2021-03-09 VITALS — BP 151/78 | HR 79 | Temp 98.6°F | Resp 18 | Ht 64.0 in | Wt 133.6 lb

## 2021-03-09 DIAGNOSIS — Z5112 Encounter for antineoplastic immunotherapy: Secondary | ICD-10-CM | POA: Diagnosis not present

## 2021-03-09 DIAGNOSIS — C3491 Malignant neoplasm of unspecified part of right bronchus or lung: Secondary | ICD-10-CM | POA: Diagnosis present

## 2021-03-09 DIAGNOSIS — Z79899 Other long term (current) drug therapy: Secondary | ICD-10-CM | POA: Insufficient documentation

## 2021-03-09 DIAGNOSIS — R5382 Chronic fatigue, unspecified: Secondary | ICD-10-CM

## 2021-03-09 LAB — CBC WITH DIFFERENTIAL (CANCER CENTER ONLY)
Abs Immature Granulocytes: 0.01 10*3/uL (ref 0.00–0.07)
Basophils Absolute: 0 10*3/uL (ref 0.0–0.1)
Basophils Relative: 0 %
Eosinophils Absolute: 0.1 10*3/uL (ref 0.0–0.5)
Eosinophils Relative: 2 %
HCT: 36.7 % (ref 36.0–46.0)
Hemoglobin: 12.1 g/dL (ref 12.0–15.0)
Immature Granulocytes: 0 %
Lymphocytes Relative: 25 %
Lymphs Abs: 1 10*3/uL (ref 0.7–4.0)
MCH: 29.1 pg (ref 26.0–34.0)
MCHC: 33 g/dL (ref 30.0–36.0)
MCV: 88.2 fL (ref 80.0–100.0)
Monocytes Absolute: 0.3 10*3/uL (ref 0.1–1.0)
Monocytes Relative: 8 %
Neutro Abs: 2.6 10*3/uL (ref 1.7–7.7)
Neutrophils Relative %: 65 %
Platelet Count: 147 10*3/uL — ABNORMAL LOW (ref 150–400)
RBC: 4.16 MIL/uL (ref 3.87–5.11)
RDW: 12.9 % (ref 11.5–15.5)
WBC Count: 4 10*3/uL (ref 4.0–10.5)
nRBC: 0 % (ref 0.0–0.2)

## 2021-03-09 LAB — CMP (CANCER CENTER ONLY)
ALT: 26 U/L (ref 0–44)
AST: 27 U/L (ref 15–41)
Albumin: 3.9 g/dL (ref 3.5–5.0)
Alkaline Phosphatase: 97 U/L (ref 38–126)
Anion gap: 10 (ref 5–15)
BUN: 11 mg/dL (ref 8–23)
CO2: 22 mmol/L (ref 22–32)
Calcium: 8.7 mg/dL — ABNORMAL LOW (ref 8.9–10.3)
Chloride: 106 mmol/L (ref 98–111)
Creatinine: 0.73 mg/dL (ref 0.44–1.00)
GFR, Estimated: >60 mL/min (ref >60)
Glucose, Bld: 79 mg/dL (ref 70–99)
Potassium: 3.6 mmol/L (ref 3.5–5.1)
Sodium: 138 mmol/L (ref 135–145)
Total Bilirubin: 0.4 mg/dL (ref 0.3–1.2)
Total Protein: 6.7 g/dL (ref 6.5–8.1)

## 2021-03-09 LAB — TSH: TSH: 2.14 u[IU]/mL (ref 0.308–3.960)

## 2021-03-09 MED ORDER — SODIUM CHLORIDE 0.9 % IV SOLN
1500.0000 mg | Freq: Once | INTRAVENOUS | Status: AC
  Administered 2021-03-09: 1500 mg via INTRAVENOUS
  Filled 2021-03-09: qty 30

## 2021-03-09 MED ORDER — SODIUM CHLORIDE 0.9 % IV SOLN: Freq: Once | INTRAVENOUS | Status: AC

## 2021-03-09 NOTE — Patient Instructions (Signed)
[facility]  Discharge Instructions: Thank you for choosing [facility] to provide your oncology and hematology care.   If you have a lab appointment with the Cancer Center, please go directly to the Cancer Center and check in at the registration area.   Wear comfortable clothing and clothing appropriate for easy access to any Portacath or PICC line.   We strive to give you quality time with your provider. You may need to reschedule your appointment if you arrive late (15 or more minutes).  Arriving late affects you and other patients whose appointments are after yours.  Also, if you miss three or more appointments without notifying the office, you may be dismissed from the clinic at the provider's discretion.      For prescription refill requests, have your pharmacy contact our office and allow 72 hours for refills to be completed.    Today you received the following chemotherapy and/or immunotherapy agents Imfinzi      To help prevent nausea and vomiting after your treatment, we encourage you to take your nausea medication as directed.  BELOW ARE SYMPTOMS THAT SHOULD BE REPORTED IMMEDIATELY: *FEVER GREATER THAN 100.4 F (38 C) OR HIGHER *CHILLS OR SWEATING *NAUSEA AND VOMITING THAT IS NOT CONTROLLED WITH YOUR NAUSEA MEDICATION *UNUSUAL SHORTNESS OF BREATH *UNUSUAL BRUISING OR BLEEDING *URINARY PROBLEMS (pain or burning when urinating, or frequent urination) *BOWEL PROBLEMS (unusual diarrhea, constipation, pain near the anus) TENDERNESS IN MOUTH AND THROAT WITH OR WITHOUT PRESENCE OF ULCERS (sore throat, sores in mouth, or a toothache) UNUSUAL RASH, SWELLING OR PAIN  UNUSUAL VAGINAL DISCHARGE OR ITCHING   Items with * indicate a potential emergency and should be followed up as soon as possible or go to the Emergency Department if any problems should occur.  Please show the CHEMOTHERAPY ALERT CARD or IMMUNOTHERAPY ALERT CARD at check-in to the  Emergency Department and triage nurse.  Should you have questions after your visit or need to cancel or reschedule your appointment, please contact [facility]  Dept: [phone]  and follow the prompts.  Office hours are 8:00 a.m. to 4:30 p.m. Monday - Friday. Please note that voicemails left after 4:00 p.m. may not be returned until the following business day.  We are closed weekends and major holidays. You have access to a nurse at all times for urgent questions. Please call the main number to the clinic Dept: [phone] and follow the prompts.   For any non-urgent questions, you may also contact your provider using MyChart. We now offer e-Visits for anyone 18 and older to request care online for non-urgent symptoms. For details visit [email or web address].   Also download the MyChart app! Go to the app store, search "MyChart", open the app, select , and log in with your MyChart username and password.  Due to Covid, a mask is required upon entering the hospital/clinic. If you do not have a mask, one will be given to you upon arrival. For doctor visits, patients may have 1 support person aged 18 or older with them. For treatment visits, patients cannot have anyone with them due to current Covid guidelines and our immunocompromised population.   

## 2021-03-09 NOTE — Progress Notes (Signed)
[facility] [phone]   [phone]  OFFICE PROGRESS NOTE  Carlena Hurl, PA-C [address] [place] [address]  DIAGNOSIS: Stage IIIA (TX, N2, M0) non-small cell lung cancer, squamous cell carcinoma presented with subcarinal mass/lymphadenopathy diagnosed in July 2022.   PRIOR THERAPY: Weekly carboplatin for AUC of 2 and paclitaxel 45 mg/m2.  First dose expected on 11/02/2020.  Status post 7 cycles.  Last dose was given 12/14/2020   CURRENT THERAPY: Consolidation treatment with immunotherapy with Imfinzi 1500 Mg IV every 4 weeks.  First dose 01/14/2021.  Status post 2 cycles  INTERVAL HISTORY: Katrina Campbell 65 y.o. female returns to the clinic today for follow-up visit.  The patient is feeling fine today with no concerning complaints except for shortness of breath with exertion.  She was seen by Dr. Valeta Harms for the collapsed right middle lobe and she underwent bronchoscopy and the cytology showed atypical cells suspicious for malignancy.  She had CT scan of the chest few weeks ago that showed no clear evidence for disease progression.  She also had barium swallow that was unremarkable.  She also had a barium swallow yesterday that was unremarkable.  She denied having any current chest pain or hemoptysis.  She denied having any nausea, vomiting, diarrhea or constipation.  She has no headache or visual changes.  She is here today for evaluation before starting cycle #3.  MEDICAL HISTORY: Past Medical History:  Diagnosis Date   Anxiety    COPD (chronic obstructive pulmonary disease) ([place])    Coronary artery disease    Dental staining 07/15/2019   Depression    History of radiation therapy    Right lung- 11/03/20-12/15/20- Dr. Gery Pray   Hyperlipidemia    Labile hypertension    Myocardial infarction 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  MEDICATIONS:  Current Outpatient Medications  Medication Sig Dispense Refill   acetaminophen (TYLENOL) 500 MG tablet Take 500 mg by mouth every 6 (six) hours as needed for moderate pain or headache.     albuterol (PROVENTIL) (2.5 MG/3ML) 0.083% nebulizer solution Take 3 mLs (2.5 mg total) by nebulization every 6 (six) hours as needed for wheezing or shortness of breath. (Patient taking differently: Take 2.5 mg by nebulization 2 (two) times daily.) 540 mL 3   aspirin EC 81 MG tablet Take 81 mg by mouth daily with lunch.      buPROPion (WELLBUTRIN XL) 300 MG 24 hr tablet Take 1 tablet (300 mg total) by mouth daily. 90 tablet 3   Cholecalciferol (DIALYVITE VITAMIN D 5000) 125 MCG (5000 UT) capsule Take 5,000 Units by mouth daily.     esomeprazole (NEXIUM) 40 MG capsule Take 1 capsule (40 mg total) by mouth daily. 90 capsule 3   fluticasone (FLONASE) 50 MCG/ACT nasal spray Place 1 spray into both nostrils as needed for allergies or rhinitis.     Fluticasone-Umeclidin-Vilant (TRELEGY ELLIPTA) 100-62.5-25 MCG/ACT AEPB Inhale 1 puff into the lungs daily. 180 each 3   Guaifenesin (MUCINEX MAXIMUM STRENGTH) 1200 MG TB12 Take 1,200 mg by mouth daily.     Magnesium 500 MG TABS Take 500 mg by mouth daily with lunch.     Multiple Vitamins-Minerals (IMMUNE SUPPORT PO) Take 1 tablet by mouth daily.     Respiratory Therapy Supplies (FLUTTER) DEVI Use as directed 1 each 0  rosuvastatin (CRESTOR) 10 MG tablet Take 1 tablet (10 mg total) by mouth daily. 90 tablet 3   temazepam (RESTORIL) 15 MG capsule Take 1 capsule (15 mg total) by mouth at bedtime as needed. 30 capsule 2   vitamin B-12 (CYANOCOBALAMIN) 1000 MCG tablet Take 1,000 mcg by mouth daily.     ezetimibe (ZETIA) 10 MG tablet Take 1 tablet (10 mg total) by mouth daily. (Patient not taking: Reported on 03/09/2021) 90 tablet 1   No current facility-administered medications  for this visit.   Facility-Administered Medications Ordered in Other Visits  Medication Dose Route Frequency Provider Last Rate Last Admin   Sonafine emulsion 1 application  1 application Topical Once Gery Pray, MD        SURGICAL HISTORY:  Past Surgical History:  Procedure Laterality Date   BIOPSY  01/14/2021   Procedure: BIOPSY;  Surgeon: Garner Nash, DO;  Location: [facility];  Service: Pulmonary;;   BRONCHIAL BIOPSY  10/08/2020   Procedure: BRONCHIAL BIOPSIES;  Surgeon: Garner Nash, DO;  Location: [facility];  Service: Pulmonary;;   BRONCHIAL BRUSHINGS  10/08/2020   Procedure: BRONCHIAL BRUSHINGS;  Surgeon: Garner Nash, DO;  Location: [facility];  Service: Pulmonary;;   BRONCHIAL BRUSHINGS  01/14/2021   Procedure: BRONCHIAL BRUSHINGS;  Surgeon: Garner Nash, DO;  Location: [facility];  Service: Pulmonary;;   BRONCHIAL WASHINGS  10/08/2020   Procedure: BRONCHIAL WASHINGS;  Surgeon: Garner Nash, DO;  Location: [facility];  Service: Pulmonary;;   BRONCHIAL WASHINGS  01/14/2021   Procedure: BRONCHIAL WASHINGS;  Surgeon: Garner Nash, DO;  Location: [facility];  Service: Pulmonary;;   FINE NEEDLE ASPIRATION  10/08/2020   Procedure: FINE NEEDLE ASPIRATION (FNA) LINEAR;  Surgeon: Garner Nash, DO;  Location: [facility];  Service: Pulmonary;;   LEFT HEART CATH AND CORONARY ANGIOGRAPHY N/A 11/07/2017   Procedure: LEFT HEART CATH AND CORONARY ANGIOGRAPHY;  Surgeon: Nelva Bush, MD;  Location: [facility];  Service: Cardiovascular;  Laterality: N/A;   NASAL SINUS SURGERY  1986   VIDEO BRONCHOSCOPY Bilateral 01/29/2019   Procedure: VIDEO BRONCHOSCOPY WITH FLUORO;  Surgeon: Marshell Garfinkel, MD;  Location: [facility];  Service: Cardiopulmonary;  Laterality: Bilateral;   VIDEO BRONCHOSCOPY Right 01/14/2021   Procedure: VIDEO BRONCHOSCOPY WITHOUT FLUORO;  Surgeon: Garner Nash, DO;  Location: [facility];  Service: Pulmonary;  Laterality:  Right;  possible cryotherapy   VIDEO BRONCHOSCOPY WITH ENDOBRONCHIAL ULTRASOUND N/A 10/08/2020   Procedure: VIDEO BRONCHOSCOPY WITH ENDOBRONCHIAL ULTRASOUND;  Surgeon: Garner Nash, DO;  Location: [facility];  Service: Pulmonary;  Laterality: N/A;    REVIEW OF SYSTEMS:  A comprehensive review of systems was negative except for: Respiratory: positive for dyspnea on exertion   PHYSICAL EXAMINATION: General appearance: alert, cooperative, fatigued, and no distress Head: Normocephalic, without obvious abnormality, atraumatic Neck: no adenopathy, no JVD, supple, symmetrical, trachea midline, and thyroid not enlarged, symmetric, no tenderness/mass/nodules Lymph nodes: Cervical, supraclavicular, and axillary nodes normal. Resp: clear to auscultation bilaterally Back: symmetric, no curvature. ROM normal. No CVA tenderness. Cardio: regular rate and rhythm, S1, S2 normal, no murmur, click, rub or gallop GI: soft, non-tender; bowel sounds normal; no masses,  no organomegaly Extremities: extremities normal, atraumatic, no cyanosis or edema  ECOG PERFORMANCE STATUS: 1 - Symptomatic but completely ambulatory  Blood pressure (!) 151/78, pulse 79, temperature 98.6 F (37 C), temperature source Tympanic, resp. rate 18, height 5\' 4"  (1.626 m), weight 133 lb 9.6 oz (60.6 kg), SpO2 96 %.  LABORATORY DATA: Lab Results  Component Value Date   WBC 4.0 03/09/2021   HGB 12.1 03/09/2021   HCT 36.7 03/09/2021   MCV 88.2 03/09/2021   PLT 147 (L) 03/09/2021      Chemistry      Component Value Date/Time   NA 140 02/09/2021 1031   NA 144 02/10/2020 0813   K 4.0 02/09/2021 1031   CL 107 02/09/2021 1031   CO2 24 02/09/2021 1031   BUN 13 02/09/2021 1031   BUN 10 02/10/2020 0813   CREATININE 0.74 02/09/2021 1031   CREATININE 0.84 11/11/2019 1411      Component Value Date/Time   CALCIUM 8.8 (L) 02/09/2021 1031   ALKPHOS 103 02/09/2021 1031   AST 26 02/09/2021 1031   ALT 26 03/04/2021 0731   ALT 28  02/09/2021 1031   BILITOT 0.3 02/09/2021 1031       RADIOGRAPHIC STUDIES: DG Chest 2 View  Result Date: 03/08/2021 CLINICAL DATA:  Shortness of breath. EXAM: CHEST - 2 VIEW COMPARISON:  CT chest 02/19/2021; X-ray chest 02/09/2021. FINDINGS: Partial right upper lobe collapse. No other focal parenchymal opacity. Lungs are hyperinflated as can be seen with COPD. No pleural effusion or pneumothorax. Heart and mediastinal contours are unremarkable. No acute osseous abnormality. IMPRESSION: Persistent partial right upper lobe collapse. No acute cardiopulmonary disease. COPD. Electronically Signed   By: Kathreen Devoid M.D.   On: 03/08/2021 13:56   DG Chest 2 View  Result Date: 02/10/2021 CLINICAL DATA:  Tachypnea.  Previous right lung carcinoma. EXAM: CHEST - 2 VIEW COMPARISON:  01/18/2021 FINDINGS: Heart size remains normal. Pulmonary hyperinflation is again seen, consistent with COPD. There has been progressive right upper lobe volume loss and scarring since previous study. Mild right hilar soft tissue fullness remains stable. Left lung remains clear. No evidence of pleural effusion. IMPRESSION: Progressive right upper lobe volume loss and scarring since previous study. Stable right hilar soft tissue prominence. Consider chest CT with contrast for further evaluation. COPD. Electronically Signed   By: Marlaine Hind M.D.   On: 02/10/2021 08:45   CT CHEST HIGH RESOLUTION  Result Date: 02/19/2021 CLINICAL DATA:  65 year old female with history of lung cancer status post completion of chemotherapy, recent initiation of immunotherapy. EXAM: CT CHEST WITHOUT CONTRAST TECHNIQUE: Multidetector CT imaging of the chest was performed following the standard protocol without intravenous contrast. High resolution imaging of the lungs, as well as inspiratory and expiratory imaging, was performed. COMPARISON:  Chest CT 12/31/2020. FINDINGS: Cardiovascular: Heart size is normal. There is no significant pericardial fluid,  thickening or pericardial calcification. There is aortic atherosclerosis, as well as atherosclerosis of the great vessels of the mediastinum and the coronary arteries, including calcified atherosclerotic plaque in the left main, left anterior descending, left circumflex and right coronary arteries. Ectasia of ascending thoracic aorta (4.2 cm in diameter). Mediastinum/Nodes: No pathologically enlarged mediastinal or hilar lymph nodes. Esophagus is unremarkable in appearance. No axillary lymphadenopathy. Lungs/Pleura: Compared to the prior examination there has been substantial right upper lobe volume loss, which is now a centrally completely collapsed, with compensatory hyperexpansion of the right middle lobe. There is also some developing septal thickening and reticulation in the paramediastinal aspect of both lungs, likely evolving radiation changes. No new suspicious appearing pulmonary nodules or masses are noted. No acute consolidative airspace disease. No pleural effusions. Diffuse bronchial wall thickening with moderate centrilobular and paraseptal emphysema. Upper Abdomen: Aortic atherosclerosis. Musculoskeletal: There are no aggressive appearing lytic or blastic lesions noted in the  visualized portions of the skeleton. IMPRESSION: 1. Interval collapse of the right upper lobe. Evolving postradiation changes in the paramediastinal aspect of both lungs. Node definite imaging findings suspicious for residual/recurrent or metastatic disease in the thorax. 2. Diffuse bronchial wall thickening with moderate centrilobular and paraseptal emphysema; imaging findings suggestive of underlying COPD. 3. Aortic atherosclerosis, in addition to left main and 3 vessel coronary artery disease. Please note that although the presence of coronary artery calcium documents the presence of coronary artery disease, the severity of this disease and any potential stenosis cannot be assessed on this non-gated CT examination. Assessment  for potential risk factor modification, dietary therapy or pharmacologic therapy may be warranted, if clinically indicated. 4. Ectasia of ascending thoracic aorta (4.2 cm in diameter). Attention on routine follow-up imaging examinations is recommended to ensure continued stability. Aortic Atherosclerosis (ICD10-I70.0) and Emphysema (ICD10-J43.9). Electronically Signed   By: Vinnie Langton M.D.   On: 02/19/2021 16:37   DG ESOPHAGUS W DOUBLE CM (HD)  Result Date: 03/08/2021 CLINICAL DATA:  Dysphagia. History of lung cancer with thoracic radiation. EXAM: ESOPHOGRAM / BARIUM SWALLOW / BARIUM TABLET STUDY TECHNIQUE: Combined double contrast and single contrast examination performed using effervescent crystals, thick barium liquid, and thin barium liquid. The patient was observed with fluoroscopy swallowing a 13 mm barium sulphate tablet. FLUOROSCOPY TIME:  Fluoroscopy Time: 1 minute and 48 seconds of low-dose pulsed fluoroscopy. Radiation Exposure Index (if provided by the fluoroscopic device): 7.1 mGy Number of Acquired Spot Images: 0 COMPARISON:  Chest CT 02/19/2021.  PET-CT 09/29/2020. FINDINGS: The patient swallowed the barium without difficulty. Rapid sequence imaging of the pharynx in the AP and lateral projections demonstrates no laryngeal penetration or mucosal abnormalities. Persistent right upper lobe collapse, as seen on recent chest CT. The esophageal motility is normal. There is no evidence of esophageal mass, stricture or ulceration. No significant hiatal hernia. No gastroesophageal reflux was elicited with the water siphon test. At the conclusion of the study, a 13 mm barium tablet was administered. This passed without delay into the stomach. IMPRESSION: 1. No significant findings or explanation for dysphagia. 2. Persistent right upper lobe collapse, similar to recent chest CT. Electronically Signed   By: Richardean Sale M.D.   On: 03/08/2021 11:34    ASSESSMENT AND PLAN: This is a very pleasant 65  years old white female recently diagnosed with a stage IIIA (TX, N2, M0) non-small cell lung cancer, squamous cell carcinoma presented with subcarinal mass/lymphadenopathy diagnosed in July 2022. The patient underwent a course of concurrent chemoradiation with weekly carboplatin for AUC of 2 and paclitaxel 45 Mg/M2 status post 7 cycles. The patient tolerated her treatment well except for fatigue and sore throat. She had repeat CT scan of the chest performed recently.  I personally and independently reviewed the scan images and discussed the result and showed the images to the patient today. Her scan showed improvement of the subcarinal lymph node that was the presentation of her disease.  It also showed collapse of the right middle lobe that was biopsy-proven to be consistent with malignancy. The patient is currently undergoing consolidation treatment with immunotherapy with Imfinzi 1500 Mg IV every 4 weeks status post 2 cycles. The patient has been tolerating this treatment well with no concerning adverse effects. I recommended for the patient to proceed with cycle #3 today as planned. I will see her back for follow-up visit in 4 weeks for evaluation before the next cycle of her treatment. She was advised to call immediately  if she has any concerning symptoms in the interval.  The patient voices understanding of current disease status and treatment options and is in agreement with the current care plan.  All questions were answered. The patient knows to call the clinic with any problems, questions or concerns. We can certainly see the patient much sooner if necessary.  Disclaimer: This note was dictated with voice recognition software. Similar sounding words can inadvertently be transcribed and may not be corrected upon review.

## 2021-03-10 ENCOUNTER — Telehealth: Payer: Self-pay | Admitting: Pulmonary Disease

## 2021-03-10 ENCOUNTER — Ambulatory Visit: Payer: Medicare Other | Admitting: Pulmonary Disease

## 2021-03-10 DIAGNOSIS — J9819 Other pulmonary collapse: Secondary | ICD-10-CM

## 2021-03-10 DIAGNOSIS — C3491 Malignant neoplasm of unspecified part of right bronchus or lung: Secondary | ICD-10-CM

## 2021-03-10 NOTE — Telephone Encounter (Signed)
PCCM:  Called spoke with patient about recent chest x-ray results.  Persistent right upper lobe collapse.  I explained some of the different options to include continued use of her flutter valve vest therapy to see if this opens up on its own.  I did explain that it is possible that we could be dealing with recurrent endobronchial tumor that be causing right upper lobe collapse.  We will have a repeat chest x-ray after Christmas.  If the chest x-ray still shows persistent right upper lobe collapse we will take her for repeat bronchoscopy.  Garner Nash, DO [facility] 03/10/2021 6:53 PM

## 2021-03-14 ENCOUNTER — Emergency Department (HOSPITAL_COMMUNITY): Payer: Medicare Other

## 2021-03-14 ENCOUNTER — Other Ambulatory Visit: Payer: Self-pay

## 2021-03-14 ENCOUNTER — Emergency Department (HOSPITAL_COMMUNITY)
Admission: EM | Admit: 2021-03-14 | Discharge: 2021-03-14 | Disposition: A | Discharge status: Home / Self Care | Payer: Medicare Other | Attending: Emergency Medicine | Admitting: Emergency Medicine

## 2021-03-14 DIAGNOSIS — Z87891 Personal history of nicotine dependence: Secondary | ICD-10-CM | POA: Diagnosis not present

## 2021-03-14 DIAGNOSIS — R059 Cough, unspecified: Secondary | ICD-10-CM | POA: Insufficient documentation

## 2021-03-14 DIAGNOSIS — Z7982 Long term (current) use of aspirin: Secondary | ICD-10-CM | POA: Diagnosis not present

## 2021-03-14 DIAGNOSIS — Z20822 Contact with and (suspected) exposure to covid-19: Secondary | ICD-10-CM | POA: Insufficient documentation

## 2021-03-14 DIAGNOSIS — R079 Chest pain, unspecified: Secondary | ICD-10-CM | POA: Diagnosis not present

## 2021-03-14 DIAGNOSIS — R0602 Shortness of breath: Secondary | ICD-10-CM | POA: Diagnosis not present

## 2021-03-14 DIAGNOSIS — I2510 Atherosclerotic heart disease of native coronary artery without angina pectoris: Secondary | ICD-10-CM | POA: Diagnosis not present

## 2021-03-14 DIAGNOSIS — J441 Chronic obstructive pulmonary disease with (acute) exacerbation: Secondary | ICD-10-CM | POA: Insufficient documentation

## 2021-03-14 DIAGNOSIS — Z7951 Long term (current) use of inhaled steroids: Secondary | ICD-10-CM | POA: Diagnosis not present

## 2021-03-14 DIAGNOSIS — Z859 Personal history of malignant neoplasm, unspecified: Secondary | ICD-10-CM | POA: Diagnosis not present

## 2021-03-14 LAB — CBC WITH DIFFERENTIAL/PLATELET
Abs Immature Granulocytes: 0.01 10*3/uL (ref 0.00–0.07)
Basophils Absolute: 0 10*3/uL (ref 0.0–0.1)
Basophils Relative: 0 %
Eosinophils Absolute: 0 10*3/uL (ref 0.0–0.5)
Eosinophils Relative: 1 %
HCT: 36.7 % (ref 36.0–46.0)
Hemoglobin: 12.2 g/dL (ref 12.0–15.0)
Immature Granulocytes: 0 %
Lymphocytes Relative: 15 %
Lymphs Abs: 0.8 10*3/uL (ref 0.7–4.0)
MCH: 29.3 pg (ref 26.0–34.0)
MCHC: 33.2 g/dL (ref 30.0–36.0)
MCV: 88 fL (ref 80.0–100.0)
Monocytes Absolute: 0.3 10*3/uL (ref 0.1–1.0)
Monocytes Relative: 6 %
Neutro Abs: 4.1 10*3/uL (ref 1.7–7.7)
Neutrophils Relative %: 78 %
Platelets: 139 10*3/uL — ABNORMAL LOW (ref 150–400)
RBC: 4.17 MIL/uL (ref 3.87–5.11)
RDW: 13.2 % (ref 11.5–15.5)
WBC: 5.2 10*3/uL (ref 4.0–10.5)
nRBC: 0 % (ref 0.0–0.2)

## 2021-03-14 LAB — BASIC METABOLIC PANEL
Anion gap: 8 (ref 5–15)
BUN: 11 mg/dL (ref 8–23)
CO2: 24 mmol/L (ref 22–32)
Calcium: 8.9 mg/dL (ref 8.9–10.3)
Chloride: 110 mmol/L (ref 98–111)
Creatinine, Ser: 0.72 mg/dL (ref 0.44–1.00)
GFR, Estimated: >60 mL/min (ref >60)
Glucose, Bld: 121 mg/dL — ABNORMAL HIGH (ref 70–99)
Potassium: 4 mmol/L (ref 3.5–5.1)
Sodium: 142 mmol/L (ref 135–145)

## 2021-03-14 LAB — RESP PANEL BY RT-PCR (FLU A&B, COVID) ARPGX2
Influenza A by PCR: NEGATIVE
Influenza B by PCR: NEGATIVE
SARS Coronavirus 2 by RT PCR: NEGATIVE

## 2021-03-14 LAB — LACTIC ACID, PLASMA: Lactic Acid, Venous: 1.3 mmol/L (ref 0.5–1.9)

## 2021-03-14 LAB — TROPONIN I (HIGH SENSITIVITY): Troponin I (High Sensitivity): 10 ng/L (ref <18)

## 2021-03-14 MED ORDER — LORAZEPAM 2 MG/ML IJ SOLN
0.5000 mg | Freq: Once | INTRAMUSCULAR | Status: AC
  Administered 2021-03-14: 0.5 mg via INTRAVENOUS
  Filled 2021-03-14: qty 1

## 2021-03-14 MED ORDER — IOHEXOL 350 MG/ML SOLN
75.0000 mL | Freq: Once | INTRAVENOUS | Status: AC | PRN
  Administered 2021-03-14: 75 mL via INTRAVENOUS

## 2021-03-14 NOTE — ED Provider Notes (Signed)
[facility] DEPT Provider Note   CSN: [id number] Arrival date & time: 03/14/21  1326     History Chief Complaint  Patient presents with   Shortness of Breath    Memphis Creswell is a 65 y.o. female.  65 year old female with prior medical history as detailed below presents for evaluation.  Patient is currently being treated for lung cancer.  Patient with complaint of paroxysms of cough that started this afternoon.  She reports that she cannot stop coughing.  The cough itself is without production.  She denies hemoptysis.  She denies recent fever.  She reports using 2 nebulized breathing treatments prior to calling EMS without improvement in her symptoms.  First responders noted that she was mildly hypoxic on room air and the upper 80s.  She was placed on 4 L nasal cannula with a resulting pulse ox in the mid 90s.  Patient is able to speak in full sentences in between coughing fits.  She complains of anterior chest discomfort with cough.  The history is provided by the patient, medical records and the EMS personnel.  Shortness of Breath Severity:  Mild Duration:  3 hours Timing:  Rare Progression:  Worsening Chronicity:  New Relieved by:  Nothing Worsened by:  Nothing     Past Medical History:  Diagnosis Date   Anxiety    COPD (chronic obstructive pulmonary disease) ([place])    Coronary artery disease    Dental staining 07/15/2019   Depression    History of radiation therapy    Right lung- 11/03/20-12/15/20- Dr. Gery Pray   Hyperlipidemia    Labile hypertension    Myocardial infarction [facility])    Pneumonia    Pulmonary mycobacterial infection ([place]) 10/24/2018    Patient Active Problem List   Diagnosis Date Noted   Dysphagia 02/12/2021   Gastroesophageal reflux disease 02/12/2021   Encounter for antineoplastic immunotherapy 02/09/2021   Endobronchial cancer, right ([place]) 01/14/2021   Stage III squamous cell carcinoma of right lung ([place])  10/19/2020   Encounter for antineoplastic chemotherapy 10/19/2020   Adenopathy 10/01/2020   Leg cramp 09/22/2020   Deficiency of other specified B group vitamins  09/22/2020   Fatigue 09/22/2020   B12 deficiency 09/22/2020   Vitamin deficiency 09/22/2020   Abnormal thyroid blood test 09/22/2020   Abnormal thyroid exam 09/22/2020   Other specified disorders of thyroid  09/22/2020   Myalgia due to statin 03/09/2020   Depression 01/12/2020   Anxiety 01/12/2020   Hyperlipidemia 01/12/2020   Labile hypertension 01/12/2020   Acute coronary syndrome ([place]) 01/12/2020   Dental staining 07/15/2019   Bronchiectasis with acute exacerbation ([place]) 04/17/2019   Stage 2 moderate COPD by GOLD classification ([place]) 04/17/2019   Cough    Chronic insomnia 01/07/2019   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   Chest pain 05/29/2018   Pulmonary nodules 02/06/2018   CAD (coronary artery disease) 11/21/2017   Swelling of joint of upper arm, right 11/14/2017   Swelling of extremity, right 11/14/2017   Redness of forearm 11/14/2017   Ischemic chest pain ([place]) 11/07/2017   COPD with acute exacerbation ([place]) 03/09/2017   Dysthymia 03/09/2017   DOE (dyspnea on exertion) 06/23/2016   Vitamin D deficiency 12/30/2015   Thoracic aortic aneurysm 12/30/2015   Atherosclerosis of aorta ([place]) 12/30/2015   Osteopenia 12/30/2015   Abnormal CT of the chest 02/10/2015   COPD (chronic obstructive pulmonary disease) ([place])     Past Surgical  History:  Procedure Laterality Date   BIOPSY  01/14/2021   Procedure: BIOPSY;  Surgeon: Garner Nash, DO;  Location: [facility];  Service: Pulmonary;;   BRONCHIAL BIOPSY  10/08/2020   Procedure: BRONCHIAL BIOPSIES;  Surgeon: Garner Nash, DO;  Location: [facility];  Service: Pulmonary;;   BRONCHIAL BRUSHINGS  10/08/2020   Procedure: BRONCHIAL BRUSHINGS;  Surgeon: Garner Nash, DO;  Location: [facility];  Service: Pulmonary;;   BRONCHIAL BRUSHINGS  01/14/2021   Procedure: BRONCHIAL BRUSHINGS;  Surgeon: Garner Nash, DO;  Location: [facility];  Service: Pulmonary;;   BRONCHIAL WASHINGS  10/08/2020   Procedure: BRONCHIAL WASHINGS;  Surgeon: Garner Nash, DO;  Location: [facility];  Service: Pulmonary;;   BRONCHIAL WASHINGS  01/14/2021   Procedure: BRONCHIAL WASHINGS;  Surgeon: Garner Nash, DO;  Location: [facility];  Service: Pulmonary;;   FINE NEEDLE ASPIRATION  10/08/2020   Procedure: FINE NEEDLE ASPIRATION (FNA) LINEAR;  Surgeon: Garner Nash, DO;  Location: [facility];  Service: Pulmonary;;   LEFT HEART CATH AND CORONARY ANGIOGRAPHY N/A 11/07/2017   Procedure: LEFT HEART CATH AND CORONARY ANGIOGRAPHY;  Surgeon: Nelva Bush, MD;  Location: [facility];  Service: Cardiovascular;  Laterality: N/A;   NASAL SINUS SURGERY  1986   VIDEO BRONCHOSCOPY Bilateral 01/29/2019   Procedure: VIDEO BRONCHOSCOPY WITH FLUORO;  Surgeon: Marshell Garfinkel, MD;  Location: [facility];  Service: Cardiopulmonary;  Laterality: Bilateral;   VIDEO BRONCHOSCOPY Right 01/14/2021   Procedure: VIDEO BRONCHOSCOPY WITHOUT FLUORO;  Surgeon: Garner Nash, DO;  Location: [facility];  Service: Pulmonary;  Laterality: Right;  possible cryotherapy   VIDEO BRONCHOSCOPY WITH ENDOBRONCHIAL ULTRASOUND N/A 10/08/2020   Procedure: VIDEO BRONCHOSCOPY WITH ENDOBRONCHIAL ULTRASOUND;  Surgeon: Garner Nash, DO;  Location: [facility];  Service: Pulmonary;  Laterality: N/A;     OB History   No obstetric history on file.     Family History  Problem Relation Age of Onset   Heart attack Mother    Emphysema Mother    Congestive Heart Failure Mother    Heart attack Father    Congestive Heart Failure Father    Asthma Other    Lung disease Sister    Lung disease Brother    Colon cancer Neg Hx     Social History   Tobacco Use   Smoking status: Former     Packs/day: 1.75    Years: 44.00    Pack years: 77.00    Types: Cigarettes    Quit date: 11/09/2012    Years since quitting: 8.3   Smokeless tobacco: Never   Tobacco comments:    vapor  Vaping Use   Vaping Use: Former  Substance Use Topics   Alcohol use: Not Currently    Alcohol/week: 24.0 standard drinks    Types: 24 Cans of beer per week   Drug use: Not Currently    Home Medications Prior to Admission medications   Medication Sig Start Date End Date Taking? Authorizing Provider  acetaminophen (TYLENOL) 500 MG tablet Take 500 mg by mouth every 6 (six) hours as needed for moderate pain or headache.    [provider]  albuterol (PROVENTIL) (2.5 MG/3ML) 0.083% nebulizer solution Take 3 mLs (2.5 mg total) by nebulization every 6 (six) hours as needed for wheezing or shortness of breath. Patient taking differently: Take 2.5 mg by nebulization 2 (two) times daily. 04/14/20   Garner Nash, DO  aspirin EC 81 MG tablet Take 81 mg by  mouth daily with lunch.     [provider]  buPROPion (WELLBUTRIN XL) 300 MG 24 hr tablet Take 1 tablet (300 mg total) by mouth daily. 03/11/20   Steele, Modena Nunnery, MD  Cholecalciferol (DIALYVITE VITAMIN D 5000) 125 MCG (5000 UT) capsule Take 5,000 Units by mouth daily.    [provider]  esomeprazole (NEXIUM) 40 MG capsule Take 1 capsule (40 mg total) by mouth daily. 02/12/21   Tysinger, Camelia Eng, PA-C  ezetimibe (ZETIA) 10 MG tablet Take 1 tablet (10 mg total) by mouth daily. Patient not taking: Reported on 03/09/2021 03/05/21   Freada Bergeron, MD  fluticasone [facility]) 50 MCG/ACT nasal spray Place 1 spray into both nostrils as needed for allergies or rhinitis.    [provider]  Fluticasone-Umeclidin-Vilant (TRELEGY ELLIPTA) 100-62.5-25 MCG/ACT AEPB Inhale 1 puff into the lungs daily. 03/03/21   Icard, Octavio Graves, DO  Guaifenesin (MUCINEX MAXIMUM STRENGTH) 1200 MG TB12 Take 1,200 mg by mouth daily.    [provider]  Magnesium 500 MG TABS Take 500 mg by mouth daily with lunch.    [provider]  Multiple Vitamins-Minerals (IMMUNE SUPPORT PO) Take 1 tablet by mouth daily.    [provider]  Respiratory Therapy Supplies (FLUTTER) DEVI Use as directed 06/14/19   Icard, Leory Plowman L, DO  rosuvastatin (CRESTOR) 10 MG tablet Take 1 tablet (10 mg total) by mouth daily. 02/12/21   Tysinger, Camelia Eng, PA-C  temazepam (RESTORIL) 15 MG capsule Take 1 capsule (15 mg total) by mouth at bedtime as needed. 02/12/21   Tysinger, Camelia Eng, PA-C  vitamin B-12 (CYANOCOBALAMIN) 1000 MCG tablet Take 1,000 mcg by mouth daily.    [provider]    Allergies    Breztri aerosphere [budeson-glycopyrrol-formoterol], Benadryl [diphenhydramine], Codeine, and Statins  Review of Systems   Review of Systems  Respiratory:  Positive for shortness of breath.   All other systems reviewed and are negative.  Physical Exam Updated Vital Signs SpO2 96%   Physical Exam Vitals and nursing note reviewed.  Constitutional:      General: She is not in acute distress.    Appearance: Normal appearance. She is well-developed.  HENT:     Head: Normocephalic and atraumatic.  Eyes:     Conjunctiva/sclera: Conjunctivae normal.     Pupils: Pupils are equal, round, and reactive to light.  Cardiovascular:     Rate and Rhythm: Normal rate and regular rhythm.     Heart sounds: Normal heart sounds.  Pulmonary:     Effort: Pulmonary effort is normal. No respiratory distress.     Breath sounds: Normal breath sounds.     Comments: Coughing paroxysms  Abdominal:     General: There is no distension.     Palpations: Abdomen is soft.     Tenderness: There is no abdominal tenderness.  Musculoskeletal:        General: No deformity. Normal range of motion.     Cervical back: Normal range of motion and neck supple.  Skin:    General: Skin is warm and dry.  Neurological:     General: No focal deficit present.      Mental Status: She is alert and oriented to person, place, and time.    ED Results / Procedures / Treatments   Labs (all labs ordered are listed, but only abnormal results are displayed) Labs Reviewed  CBC WITH DIFFERENTIAL/PLATELET - Abnormal; Notable for the following components:      Result  Value   Platelets 139 (*)    All other components within normal limits  BASIC METABOLIC PANEL - Abnormal; Notable for the following components:   Glucose, Bld 121 (*)    All other components within normal limits  RESP PANEL BY RT-PCR (FLU A&B, COVID) ARPGX2  CULTURE, BLOOD (ROUTINE X 2)  CULTURE, BLOOD (ROUTINE X 2)  LACTIC ACID, PLASMA  TROPONIN I (HIGH SENSITIVITY)    EKG EKG Interpretation  Date/Time:  Sunday March 14 2021 13:44:52 EST Ventricular Rate:  99 PR Interval:  137 QRS Duration: 84 QT Interval:  362 QTC Calculation: 465 R Axis:   57 Text Interpretation: Sinus rhythm Anterior infarct, old Confirmed by Dene Gentry [id number]) on 03/14/2021 2:52:38 PM  Radiology CT Angio Chest PE W and/or Wo Contrast  Result Date: 03/14/2021 CLINICAL DATA:  Pulmonary embolism (PE) suspected, unknown D-dimer. Shortness of breath. History of lung cancer. EXAM: CT ANGIOGRAPHY CHEST WITH CONTRAST TECHNIQUE: Multidetector CT imaging of the chest was performed using the standard protocol during bolus administration of intravenous contrast. Multiplanar CT image reconstructions and MIPs were obtained to evaluate the vascular anatomy. CONTRAST:  44m OMNIPAQUE IOHEXOL 350 MG/ML SOLN COMPARISON:  02/19/2021 FINDINGS: Cardiovascular: No filling defects in the pulmonary arteries to suggest pulmonary emboli. Heart is normal size. Aorta is normal caliber. Coronary artery and aortic calcifications. Mediastinum/Nodes: No mediastinal, hilar, or axillary adenopathy. Material noted in the distal trachea thyroid unremarkable. Lungs/Pleura: Improvement in aeration in the right upper lobe with re-expansion of the  right upper lobe. Radiation changes noted medially within the right lung. Diffuse bronchial wall thickening on the right. Moderate emphysema. No effusions. Upper Abdomen: Imaging into the upper abdomen demonstrates no acute findings. Musculoskeletal: Chest wall soft tissues are unremarkable. No acute bony abnormality. Review of the MIP images confirms the above findings. IMPRESSION: No evidence of pulmonary embolus. Near complete re-expansion of the right upper lobe. Emphysema, bronchial wall thickening again noted, unchanged. Mucus/debris in the distal trachea. Coronary artery disease. Aortic Atherosclerosis (ICD10-I70.0) and Emphysema (ICD10-J43.9). Electronically Signed   By: KRolm BaptiseM.D.   On: 03/14/2021 15:33   DG Chest Port 1 View  Result Date: 03/14/2021 CLINICAL DATA:  Cough EXAM: PORTABLE CHEST 1 VIEW COMPARISON:  03/08/2021 FINDINGS: The heart size and mediastinal contours are within normal limits. Improved aeration right upper lobe. No pleural effusion. The visualized skeletal structures are unremarkable. IMPRESSION: Improved aeration right upper lobe with decreased collapse. Electronically Signed   By: PMacy MisM.D.   On: 03/14/2021 14:36    Procedures Procedures   Medications Ordered in ED Medications  LORazepam (ATIVAN) injection 0.5 mg (has no administration in time range)    ED Course  I have reviewed the triage vital signs and the nursing notes.  Pertinent labs & imaging results that were available during my care of the patient were reviewed by me and considered in my medical decision making (see chart for details).    MDM Rules/Calculators/A&P                           MDM  MSE complete  CAnhelica Fowerswas evaluated in Emergency Department on 03/14/2021 for the symptoms described in the history of present illness. She was evaluated in the context of the global COVID-19 pandemic, which necessitated consideration that the patient might be at risk for infection  with the SARS-CoV-2 virus that causes COVID-19. Institutional protocols and algorithms that pertain to the evaluation of patients  at risk for COVID-19 are in a state of rapid change based on information released by regulatory bodies including the CDC and federal and state organizations. These policies and algorithms were followed during the patient's care in the ED.  Patient presented with cute onset paroxysms of coughing.  Patient does appear to be anxious on arrival.  Patient without hypoxia.  Patient with complicated recent course for treatment of lung cancer.  Screening labs obtained are without significant abnormality.  CT imaging obtained is without evidence of PE.  Of note, patient with reexpanded right upper lobe.  Patient was given small dose of Ativan on arrival for treatment of anxiety  Upon reevaluation after CT and labs resulted revealed the patient feels significantly improved.  She is no longer coughing.  She reports that she is comfortable and now desires DC home.  She does understand need for close follow-up with her regular care providers.  She will contact Dr. Valeta Harms tomorrow.  Strict return precautions given and understood.   Final Clinical Impression(s) / ED Diagnoses Final diagnoses:  Cough, unspecified type    Rx / DC Orders ED Discharge Orders     None        Valarie Merino, MD 03/14/21 1600

## 2021-03-14 NOTE — ED Triage Notes (Signed)
Patient BIBA. C/O SOB increasing over past month.  HX lung CX- last chemo in September. Immune therapy 1X monthly.   Patient self administered 2X 5mg  nebs, and 4X rescue inhaler tx.  BP: 172/100 HR: 102 RR: 24 SPO2: 95 on 4L [place]

## 2021-03-14 NOTE — Discharge Instructions (Signed)
Return for any problem.  ?

## 2021-03-15 ENCOUNTER — Other Ambulatory Visit: Payer: Medicare Other

## 2021-03-15 ENCOUNTER — Other Ambulatory Visit: Payer: Self-pay

## 2021-03-15 ENCOUNTER — Telehealth: Payer: Self-pay | Admitting: Pulmonary Disease

## 2021-03-15 DIAGNOSIS — A310 Pulmonary mycobacterial infection: Secondary | ICD-10-CM

## 2021-03-15 NOTE — Telephone Encounter (Signed)
Called patient but call went straight to VM. Left message for her to call us back.

## 2021-03-15 NOTE — Telephone Encounter (Signed)
I called and spoke with the pt  She states that after giving it some thought, she would like to have bronch done asap  She ended up going to ED 03/14/21 due to cough and SOB- CTA was done and 1 view cxr  Please advise thanks

## 2021-03-17 ENCOUNTER — Encounter: Payer: Self-pay | Admitting: Physician Assistant

## 2021-03-17 ENCOUNTER — Ambulatory Visit (INDEPENDENT_AMBULATORY_CARE_PROVIDER_SITE_OTHER): Payer: Medicare Other | Admitting: Physician Assistant

## 2021-03-17 VITALS — BP 130/60 | HR 96 | Ht 64.0 in | Wt 132.8 lb

## 2021-03-17 DIAGNOSIS — R0602 Shortness of breath: Secondary | ICD-10-CM | POA: Diagnosis not present

## 2021-03-17 DIAGNOSIS — R1314 Dysphagia, pharyngoesophageal phase: Secondary | ICD-10-CM | POA: Diagnosis not present

## 2021-03-17 DIAGNOSIS — K219 Gastro-esophageal reflux disease without esophagitis: Secondary | ICD-10-CM | POA: Diagnosis not present

## 2021-03-17 DIAGNOSIS — I2510 Atherosclerotic heart disease of native coronary artery without angina pectoris: Secondary | ICD-10-CM

## 2021-03-17 NOTE — Patient Instructions (Signed)
Continue Nexium daily.  You have been scheduled for an endoscopy. Please follow written instructions given to you at your visit today. If you use inhalers (even only as needed), please bring them with you on the day of your procedure.  If you are age 67 or older, your body mass index should be between 23-30. Your Body mass index is 22.8 kg/m. If this is out of the aforementioned range listed, please consider follow up with your Primary Care Provider.  If you are age 56 or younger, your body mass index should be between 19-25. Your Body mass index is 22.8 kg/m. If this is out of the aformentioned range listed, please consider follow up with your Primary Care Provider.   ________________________________________________________  The [facility] providers would like to encourage you to use [facility] to communicate with providers for non-urgent requests or questions.  Due to long hold times on the telephone, sending your provider a message by [facility] may be a faster and more efficient way to get a response.  Please allow 48 business hours for a response.  Please remember that this is for non-urgent requests.  _______________________________________________________

## 2021-03-17 NOTE — Progress Notes (Signed)
Chief Complaint: Discuss EGD  HPI:    [name] is a 65 year old Caucasian female, known to Dr. Ardis Hughs, with past medical history of COPD, CAD and pulmonary mycobacterial infection as well as MI and lung cancer status postradiation, who presents to clinic today as a referral from Dr. Valeta Harms for possible EGD.    08/28/2013 colonoscopy with one 3 mm polyp in the transverse colon.  Otherwise normal.  Pathology showed benign lymphoid tissue and repeat was recommended in 10 years.    12/24/2020 echo for thoracic aortic aneurysm with LVEF 55-60% and stable dilation of the aneurysm.    01/14/2021 video bronchoscopy with brushings.    03/08/2021 esophagram for dysphagia showed no significant findings.  Persistent right upper lobe collapse similar to recent chest CT.    03/14/2021 CT angio chest for shortness of breath with no evidence of pulmonary embolus.  Near complete reexpansion of the right upper lobe, emphysema, bronchial wall thickening, mucus/debris in the distal trachea and CAD as well as aortic atherosclerosis.    Today, the patient presents to clinic and tells me that her biggest complaint is that she will have episodes where she cannot get her breath.  She had one of these episodes this past weekend and was sent to the ER via ambulance.  Tells me that she really could not take a breath but the paramedics told her that she was oxygenating okay, she kept coughing and coughing but it was nonproductive until she got into the ER when she coughed up a glob of hard marble looking mucus material and then felt better.  She tells me this has occurred on more than 1 occasion and she is now anxious about it ever happening again because it makes her very scared.  (Tearful in the room).    When asked specifically about her dysphagia symptoms patient tells me she had radiation for her lung cancer which ended September 13 and since then has felt occasionally that things that she is eating seem to stick in her  esophagus on the way down.  This was worse initially, but has continued intermittently since then.  She will often then develop pain in her esophagus and will drink a lot of water and eventually this feeling will go away.  Also with a lot of belching.  She was started on Esomeprazole 40 mg daily about a month ago but does not feel like this is really helped anything.    Denies fever, chills, weight loss, abdominal pain, blood in her stool or symptoms that awaken her from sleep.  Past Medical History:  Diagnosis Date   Anxiety    COPD (chronic obstructive pulmonary disease) ([place])    Coronary artery disease    Dental staining 07/15/2019   Depression    History of radiation therapy    Right lung- 11/03/20-12/15/20- Dr. Gery Pray   Hyperlipidemia    Labile hypertension    Myocardial infarction [facility])    Pneumonia    Pulmonary mycobacterial infection ([place]) 10/24/2018    Past Surgical History:  Procedure Laterality Date   BIOPSY  01/14/2021   Procedure: BIOPSY;  Surgeon: Garner Nash, DO;  Location: [place];  Service: Pulmonary;;   BRONCHIAL BIOPSY  10/08/2020   Procedure: BRONCHIAL BIOPSIES;  Surgeon: Garner Nash, DO;  Location: [place];  Service: Pulmonary;;   BRONCHIAL BRUSHINGS  10/08/2020   Procedure: BRONCHIAL BRUSHINGS;  Surgeon: Garner Nash, DO;  Location: [place] [facility];  Service: Pulmonary;;   BRONCHIAL BRUSHINGS  01/14/2021   Procedure: BRONCHIAL BRUSHINGS;  Surgeon: Garner Nash, DO;  Location: [place] [facility];  Service: Pulmonary;;   BRONCHIAL WASHINGS  10/08/2020   Procedure: BRONCHIAL WASHINGS;  Surgeon: Garner Nash, DO;  Location: [place] [facility];  Service: Pulmonary;;   BRONCHIAL WASHINGS  01/14/2021   Procedure: BRONCHIAL WASHINGS;  Surgeon: Garner Nash, DO;  Location: [place] [facility];  Service: Pulmonary;;   FINE NEEDLE ASPIRATION  10/08/2020   Procedure: FINE NEEDLE ASPIRATION (FNA) LINEAR;  Surgeon: Garner Nash, DO;  Location: [place];   Service: Pulmonary;;   LEFT HEART CATH AND CORONARY ANGIOGRAPHY N/A 11/07/2017   Procedure: LEFT HEART CATH AND CORONARY ANGIOGRAPHY;  Surgeon: Nelva Bush, MD;  Location: [place] [facility];  Service: Cardiovascular;  Laterality: N/A;   NASAL SINUS SURGERY  1986   VIDEO BRONCHOSCOPY Bilateral 01/29/2019   Procedure: VIDEO BRONCHOSCOPY WITH FLUORO;  Surgeon: Marshell Garfinkel, MD;  Location: [place];  Service: Cardiopulmonary;  Laterality: Bilateral;   VIDEO BRONCHOSCOPY Right 01/14/2021   Procedure: VIDEO BRONCHOSCOPY WITHOUT FLUORO;  Surgeon: Garner Nash, DO;  Location: [place];  Service: Pulmonary;  Laterality: Right;  possible cryotherapy   VIDEO BRONCHOSCOPY WITH ENDOBRONCHIAL ULTRASOUND N/A 10/08/2020   Procedure: VIDEO BRONCHOSCOPY WITH ENDOBRONCHIAL ULTRASOUND;  Surgeon: Garner Nash, DO;  Location: [place];  Service: Pulmonary;  Laterality: N/A;    Current Outpatient Medications  Medication Sig Dispense Refill   acetaminophen (TYLENOL) 500 MG tablet Take 500 mg by mouth every 6 (six) hours as needed for moderate pain or headache.     albuterol (PROVENTIL) (2.5 MG/3ML) 0.083% nebulizer solution Take 3 mLs (2.5 mg total) by nebulization every 6 (six) hours as needed for wheezing or shortness of breath. (Patient taking differently: Take 2.5 mg by nebulization 2 (two) times daily.) 540 mL 3   albuterol (VENTOLIN HFA) 108 (90 Base) MCG/ACT inhaler Inhale 2 puffs into the lungs every 6 (six) hours as needed for wheezing or shortness of breath.     aspirin EC 81 MG tablet Take 81 mg by mouth daily with lunch.      buPROPion (WELLBUTRIN XL) 300 MG 24 hr tablet Take 1 tablet (300 mg total) by mouth daily. 90 tablet 3   Cholecalciferol (DIALYVITE VITAMIN D 5000) 125 MCG (5000 UT) capsule Take 5,000 Units by mouth daily.     esomeprazole (NEXIUM) 40 MG capsule Take 1 capsule (40 mg total) by mouth daily. 90 capsule 3   ezetimibe (ZETIA) 10 MG tablet Take 1 tablet (10 mg  total) by mouth daily. 90 tablet 1   fluticasone (FLONASE) 50 MCG/ACT nasal spray Place 1 spray into both nostrils daily as needed for allergies or rhinitis.     Fluticasone-Umeclidin-Vilant (TRELEGY ELLIPTA) 100-62.5-25 MCG/ACT AEPB Inhale 1 puff into the lungs daily. 180 each 3   Guaifenesin (MUCINEX MAXIMUM STRENGTH) 1200 MG TB12 Take 1,200 mg by mouth 2 (two) times daily.     Magnesium 500 MG TABS Take 500 mg by mouth daily with lunch.     Multiple Vitamins-Minerals (IMMUNE SUPPORT PO) Take 1 tablet by mouth daily.     Respiratory Therapy Supplies (FLUTTER) DEVI Use as directed 1 each 0   rosuvastatin (CRESTOR) 10 MG tablet Take 1 tablet (10 mg total) by mouth daily. 90 tablet 3   temazepam (RESTORIL) 15 MG capsule Take 1 capsule (15 mg total) by mouth at bedtime as needed. (Patient taking differently: Take 15 mg by mouth at bedtime.) 30 capsule 2   vitamin  B-12 (CYANOCOBALAMIN) 1000 MCG tablet Take 1,000 mcg by mouth daily.     No current facility-administered medications for this visit.   Facility-Administered Medications Ordered in Other Visits  Medication Dose Route Frequency Provider Last Rate Last Admin   Sonafine emulsion 1 application  1 application Topical Once Gery Pray, MD        Allergies as of 03/17/2021 - Review Complete 03/14/2021  Allergen Reaction Noted   Breztri aerosphere [budeson-glycopyrrol-formoterol] Shortness Of Breath 02/10/2021   Benadryl [diphenhydramine] Nausea Only 11/17/2020   Codeine Other (See Comments) 12/03/2020   Prednisone Other (See Comments) 03/14/2021   Statins  02/12/2021    Family History  Problem Relation Age of Onset   Heart attack Mother    Emphysema Mother    Congestive Heart Failure Mother    Heart attack Father    Congestive Heart Failure Father    Asthma Other    Lung disease Sister    Lung disease Brother    Colon cancer Neg Hx     Social History   Socioeconomic History   Marital status: Married    Spouse name: Not  on file   Number of children: Not on file   Years of education: Not on file   Highest education level: Not on file  Occupational History   Not on file  Tobacco Use   Smoking status: Former    Packs/day: 1.75    Years: 44.00    Pack years: 77.00    Types: Cigarettes    Quit date: 11/09/2012    Years since quitting: 8.3   Smokeless tobacco: Never   Tobacco comments:    vapor  Vaping Use   Vaping Use: Former  Substance and Sexual Activity   Alcohol use: Not Currently    Alcohol/week: 24.0 standard drinks    Types: 24 Cans of beer per week   Drug use: Not Currently   Sexual activity: Not Currently  Other Topics Concern   Not on file  Social History Narrative   Not on file   Social Determinants of Health   Financial Resource Strain: Medium Risk   Difficulty of Paying Living Expenses: Somewhat hard  Food Insecurity: No Food Insecurity   Worried About Charity fundraiser in the Last Year: Never true   Ran Out of Food in the Last Year: Never true  Transportation Needs: No Transportation Needs   Lack of Transportation (Medical): No   Lack of Transportation (Non-Medical): No  Physical Activity: Not on file  Stress: Stress Concern Present   Feeling of Stress : Rather much  Social Connections: Unknown   Frequency of Communication with Friends and Family: More than three times a week   Frequency of Social Gatherings with Friends and Family: More than three times a week   Attends Religious Services: More than 4 times per year   Active Member of [employer] or Organizations: Not on file   Attends Archivist Meetings: Not on file   Marital Status: Married  Human resources officer Violence: Not on file    Review of Systems:    Constitutional: No weight loss, fever or chills Skin: No rash Cardiovascular: No chest pain Respiratory: No SOB  Gastrointestinal: See HPI and otherwise negative Genitourinary: No dysuria  Neurological: No headache, dizziness or syncope Musculoskeletal:  No new muscle or joint pain Hematologic: No bleeding  Psychiatric: No history of depression or anxiety   Physical Exam:  Vital signs: BP 130/60    Pulse 96  Ht 5\' 4"  (1.626 m)    Wt 132 lb 12.8 oz (60.2 kg)    SpO2 95%    BMI 22.80 kg/m    Constitutional:   Pleasant Caucasian female appears to be in NAD, Well developed, Well nourished, alert and cooperative Head:  Normocephalic and atraumatic. Eyes:   PEERL, EOMI. No icterus. Conjunctiva pink. Ears:  Normal auditory acuity. Neck:  Supple Throat: Oral cavity and pharynx without inflammation, swelling or lesion.  Respiratory: Respirations even and unlabored. Lungs clear to auscultation bilaterally.   No wheezes, crackles, or rhonchi.  Cardiovascular: Normal S1, S2. No MRG. Regular rate and rhythm. No peripheral edema, cyanosis or pallor.  Gastrointestinal:  Soft, nondistended, nontender. No rebound or guarding. Normal bowel sounds. No appreciable masses or hepatomegaly. Rectal:  Not performed.  Msk:  Symmetrical without gross deformities. Without edema, no deformity or joint abnormality.  Neurologic:  Alert and  oriented x4;  grossly normal neurologically.  Skin:   Dry and intact without significant lesions or rashes. Psychiatric: Demonstrates good judgement and reason without abnormal affect or behaviors.  Tearful/anxious in the room when describing recent episode of shortness of breath  RELEVANT LABS AND IMAGING: CBC    Component Value Date/Time   WBC 5.2 03/14/2021 1412   RBC 4.17 03/14/2021 1412   HGB 12.2 03/14/2021 1412   HGB 12.1 03/09/2021 1252   HGB 13.7 09/22/2020 1325   HCT 36.7 03/14/2021 1412   HCT 41.2 09/22/2020 1325   PLT 139 (L) 03/14/2021 1412   PLT 147 (L) 03/09/2021 1252   PLT 143 (L) 09/22/2020 1325   MCV 88.0 03/14/2021 1412   MCV 89 09/22/2020 1325   MCH 29.3 03/14/2021 1412   MCHC 33.2 03/14/2021 1412   RDW 13.2 03/14/2021 1412   RDW 12.9 09/22/2020 1325   LYMPHSABS 0.8 03/14/2021 1412   LYMPHSABS  2.1 09/22/2020 1325   MONOABS 0.3 03/14/2021 1412   EOSABS 0.0 03/14/2021 1412   EOSABS 0.1 09/22/2020 1325   BASOSABS 0.0 03/14/2021 1412   BASOSABS 0.0 09/22/2020 1325    CMP     Component Value Date/Time   NA 142 03/14/2021 1412   NA 144 02/10/2020 0813   K 4.0 03/14/2021 1412   CL 110 03/14/2021 1412   CO2 24 03/14/2021 1412   GLUCOSE 121 (H) 03/14/2021 1412   BUN 11 03/14/2021 1412   BUN 10 02/10/2020 0813   CREATININE 0.72 03/14/2021 1412   CREATININE 0.73 03/09/2021 1252   CREATININE 0.84 11/11/2019 1411   CALCIUM 8.9 03/14/2021 1412   PROT 6.7 03/09/2021 1252   PROT 6.0 02/10/2020 0813   ALBUMIN 3.9 03/09/2021 1252   ALBUMIN 4.2 02/10/2020 0813   AST 27 03/09/2021 1252   ALT 26 03/09/2021 1252   ALKPHOS 97 03/09/2021 1252   BILITOT 0.4 03/09/2021 1252   GFRNONAA >60 03/14/2021 1412   GFRNONAA >60 03/09/2021 1252   GFRNONAA 72 11/11/2019 1411   GFRAA 86 02/10/2020 0813   GFRAA 84 11/11/2019 1411    Assessment: 1.  Dysphagia: Occasional episodes of food getting stuck in her throat and feeling like something is in there, also some reflux, unhelped by Nexium 40 mg daily over the past month, recent esophagram was fairly normal; consider stricture versus dysmotility versus other 2.  Eructations: With above 3.  Shortness of breath: Acute episodes of shortness of breath, over the weekend was the last 1 with a CT angio showing no PE, patient did have a lot of mucus though and  coughed up the mucus glob which seemed to relieve symptoms, previous history of lung cancer and radiation  Plan: 1.  Patient's primary complaint is really of these mucus globs which seem to give her shortness of breath acutely.  Explained that she should continue to follow with Dr. Valeta Harms about these in the future.  May need to see an ENT. 2.  Patient will benefit from an EGD.  This will be scheduled in the hospital given her recent episodes of shortness of breath.  Discussed this with Dr. Ardis Hughs at  time of patient's visit.  Did provide the patient a detailed list of risks for the procedure and she agrees to proceed. 3.  Patient continued on her Nexium 40 mg daily for now. 4.  Patient to follow in clinic for recommendations after EGD.  Ellouise Newer, PA-C [facility] 03/17/2021, 9:58 AM  Cc: Carlena Hurl, PA-C

## 2021-03-18 NOTE — Progress Notes (Signed)
I agree with the above note, plan. EGD at [facility]. Thanks

## 2021-03-19 LAB — CULTURE, BLOOD (ROUTINE X 2)
Culture: NO GROWTH
Culture: NO GROWTH
Special Requests: ADEQUATE

## 2021-04-01 ENCOUNTER — Other Ambulatory Visit: Payer: Medicare Other

## 2021-04-01 ENCOUNTER — Other Ambulatory Visit: Payer: Self-pay

## 2021-04-01 DIAGNOSIS — A310 Pulmonary mycobacterial infection: Secondary | ICD-10-CM

## 2021-04-01 NOTE — Addendum Note (Signed)
Addended by: Vanessa Barbara on: 04/01/2021 11:39 AM   Modules accepted: Orders

## 2021-04-01 NOTE — Telephone Encounter (Signed)
CXR ordered for 04/23/2021 prior to visit with BI.  Nothing further needed.

## 2021-04-01 NOTE — Telephone Encounter (Signed)
Dr. Valeta Harms, please advise if pt still needs CXR since she was seen in ED and had imagining after it was recommended she had a CXR after christmas. Pt has OV with you on 04/23/21. Thanks.

## 2021-04-07 ENCOUNTER — Other Ambulatory Visit: Payer: Self-pay | Admitting: Medical

## 2021-04-07 ENCOUNTER — Telehealth: Payer: Self-pay

## 2021-04-07 ENCOUNTER — Telehealth: Payer: Self-pay | Admitting: Pulmonary Disease

## 2021-04-07 DIAGNOSIS — J449 Chronic obstructive pulmonary disease, unspecified: Secondary | ICD-10-CM

## 2021-04-07 MED ORDER — BUPROPION HCL ER (XL) 300 MG PO TB24: 300.0000 mg | ORAL_TABLET | Freq: Every day | ORAL | 0 refills | Status: DC

## 2021-04-07 MED ORDER — ALBUTEROL SULFATE (2.5 MG/3ML) 0.083% IN NEBU: 2.5000 mg | INHALATION_SOLUTION | Freq: Four times a day (QID) | RESPIRATORY_TRACT | 3 refills | Status: DC | PRN

## 2021-04-07 NOTE — Telephone Encounter (Signed)
The refill has been sent to [facility]. Nothing further needed.

## 2021-04-07 NOTE — Telephone Encounter (Signed)
Received fax from [facility] for Bupropion last apt was 02/12/21.

## 2021-04-08 ENCOUNTER — Other Ambulatory Visit: Payer: Self-pay | Admitting: Internal Medicine

## 2021-04-08 NOTE — Progress Notes (Signed)
[facility] OFFICE PROGRESS NOTE  Carlena Hurl, PA-C [place] [address]  DIAGNOSIS: Stage IIIA (TX, N2, M0) non-small cell lung cancer, squamous cell carcinoma presented with subcarinal mass/lymphadenopathy diagnosed in July 2022.  PRIOR THERAPY:  Weekly carboplatin for AUC of 2 and paclitaxel 45 mg/m2.  First dose expected on 11/02/2020.  Status post 7 cycles.  Last dose was given 12/14/2020  CURRENT THERAPY: Consolidation treatment with immunotherapy with Imfinzi 1500 Mg IV every 4 weeks.  First dose 01/14/2021.  Status post 3 cycles  INTERVAL HISTORY: Katrina Campbell 66 y.o. female returns to the clinic today for a follow-up visit.  The patient was last seen in clinic 1 month ago. In the interval since her last appointment, she developed paroxysmal cough and shortness of breath.  She presented to the emergency room.  She had a CTA which was negative for PE. The patient was previously seen by Dr. Valeta Harms for collapse of the right upper lobe and she underwent bronchoscopy and cytology showed atypical cells suspicious for malignancy. The CTA performed while in the emergency room showed near complete reexpansion of the right upper lobe, mucus/debris in the distal trachea, and her baseline emphysema and bronchial wall thickening.  Her symptoms improved while in the emergency room and she went home. She denies any recurrence of these symptoms.   The patient also has been following with gastroenterology regarding sensation of food being stuck since completing radiation.  They are planing on undergoing an EGD and possible dilation on April 29, 2021. She is unsure if she wants to proceed with this procedure or not.   Otherwise, the patient tolerated her last cycle of immunotherapy well.  Denies any fever, chills, or night sweats.She has a good appetite. She reports baseline dyspnea on exertion. She denies significant cough. She quit smoking in 2014.  Denies any chest pain  or hemoptysis.  Denies any nausea, vomiting, diarrhea, or constipation.  Denies any headache or visual changes.  Denies any rashes or skin changes.  The patient is here today for evaluation and repeat blood work before starting cycle #4.    MEDICAL HISTORY: Past Medical History:  Diagnosis Date   Anxiety    COPD (chronic obstructive pulmonary disease) ([place])    Coronary artery disease    Dental staining 07/15/2019   Depression    History of radiation therapy    Right lung- 11/03/20-12/15/20- Dr. 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  MEDICATIONS:  Current Outpatient Medications  Medication Sig Dispense Refill   acetaminophen (TYLENOL) 500 MG tablet Take 500 mg by mouth every 6 (six) hours as needed for moderate pain or headache.     albuterol (PROVENTIL) (2.5 MG/3ML) 0.083% nebulizer solution Take 3 mLs (2.5 mg total) by nebulization every 6 (six) hours as needed for wheezing or shortness of breath. 540 mL 3   albuterol (VENTOLIN HFA) 108 (90 Base) MCG/ACT inhaler Inhale 2 puffs into the lungs every 6 (six) hours as needed for wheezing or shortness of breath.     aspirin EC 81 MG tablet Take 81 mg by mouth daily with lunch.      buPROPion (WELLBUTRIN XL) 300 MG 24 hr tablet Take 1 tablet (300 mg total) by mouth daily. 90 tablet 0  Cholecalciferol (DIALYVITE VITAMIN D 5000) 125 MCG (5000 UT) capsule Take 5,000 Units by mouth daily.     esomeprazole (NEXIUM) 40 MG capsule Take 1 capsule (40 mg total) by mouth daily. 90 capsule 3   ezetimibe (ZETIA) 10 MG tablet Take 1 tablet (10 mg total) by mouth daily. 90 tablet 1   fluticasone (FLONASE) 50 MCG/ACT nasal spray Place 1 spray into both nostrils daily as needed for allergies or  rhinitis.     Fluticasone-Umeclidin-Vilant (TRELEGY ELLIPTA) 100-62.5-25 MCG/ACT AEPB Inhale 1 puff into the lungs daily. 180 each 3   Guaifenesin (MUCINEX MAXIMUM STRENGTH) 1200 MG TB12 Take 1,200 mg by mouth 2 (two) times daily.     Magnesium 500 MG TABS Take 500 mg by mouth daily with lunch.     Multiple Vitamins-Minerals (IMMUNE SUPPORT PO) Take 1 tablet by mouth daily.     Respiratory Therapy Supplies (FLUTTER) DEVI Use as directed 1 each 0   rosuvastatin (CRESTOR) 10 MG tablet Take 1 tablet (10 mg total) by mouth daily. 90 tablet 3   temazepam (RESTORIL) 15 MG capsule Take 1 capsule (15 mg total) by mouth at bedtime as needed. (Patient taking differently: Take 15 mg by mouth at bedtime.) 30 capsule 2   vitamin B-12 (CYANOCOBALAMIN) 1000 MCG tablet Take 1,000 mcg by mouth daily.     No current facility-administered medications for this visit.   Facility-Administered Medications Ordered in Other Visits  Medication Dose Route Frequency Provider Last Rate Last Admin   durvalumab (IMFINZI) 1,500 mg in sodium chloride 0.9 % 100 mL chemo infusion  1,500 mg Intravenous Once Curt Bears, MD       Sonafine emulsion 1 application  1 application Topical Once Gery Pray, MD        SURGICAL HISTORY:  Past Surgical History:  Procedure Laterality Date   BIOPSY  01/14/2021   Procedure: BIOPSY;  Surgeon: Garner Nash, DO;  Location: [facility];  Service: Pulmonary;;   BRONCHIAL BIOPSY  10/08/2020   Procedure: BRONCHIAL BIOPSIES;  Surgeon: Garner Nash, DO;  Location: [place] [facility];  Service: Pulmonary;;   BRONCHIAL BRUSHINGS  10/08/2020   Procedure: BRONCHIAL BRUSHINGS;  Surgeon: Garner Nash, DO;  Location: [place] [facility];  Service: Pulmonary;;   BRONCHIAL BRUSHINGS  01/14/2021   Procedure: BRONCHIAL BRUSHINGS;  Surgeon: Garner Nash, DO;  Location: [place] [facility];  Service: Pulmonary;;   BRONCHIAL WASHINGS  10/08/2020   Procedure: BRONCHIAL WASHINGS;  Surgeon: Garner Nash, DO;   Location: [place] [facility];  Service: Pulmonary;;   BRONCHIAL WASHINGS  01/14/2021   Procedure: BRONCHIAL WASHINGS;  Surgeon: Garner Nash, DO;  Location: [place];  Service: Pulmonary;;   FINE NEEDLE ASPIRATION  10/08/2020   Procedure: FINE NEEDLE ASPIRATION (FNA) LINEAR;  Surgeon: Garner Nash, DO;  Location: [facility] [place] [facility];  Service: Pulmonary;;   LEFT HEART CATH AND CORONARY ANGIOGRAPHY N/A 11/07/2017   Procedure: LEFT HEART CATH AND CORONARY ANGIOGRAPHY;  Surgeon: Nelva Bush, MD;  Location: [place] [facility];  Service: Cardiovascular;  Laterality: N/A;   NASAL SINUS SURGERY  1986   VIDEO BRONCHOSCOPY Bilateral 01/29/2019   Procedure: VIDEO BRONCHOSCOPY WITH FLUORO;  Surgeon: Marshell Garfinkel, MD;  Location: [place];  Service: Cardiopulmonary;  Laterality: Bilateral;   VIDEO BRONCHOSCOPY Right 01/14/2021   Procedure: VIDEO BRONCHOSCOPY WITHOUT FLUORO;  Surgeon: Garner Nash, DO;  Location: [place];  Service: Pulmonary;  Laterality: Right;  possible cryotherapy   VIDEO BRONCHOSCOPY WITH ENDOBRONCHIAL ULTRASOUND N/A 10/08/2020   Procedure:  VIDEO BRONCHOSCOPY WITH ENDOBRONCHIAL ULTRASOUND;  Surgeon: Garner Nash, DO;  Location: [place] [facility];  Service: Pulmonary;  Laterality: N/A;    REVIEW OF SYSTEMS:   Review of Systems  Constitutional: Negative for appetite change, chills, fatigue, fever and unexpected weight change.  HENT: Positive for occasional dysphagia.  Negative for mouth sores, nosebleeds, sore throat and trouble swallowing.   Eyes: Negative for eye problems and icterus.  Respiratory: Positive for dyspnea on exertion. Negative for cough, hemoptysis, shortness of breath and wheezing.   Cardiovascular: Negative for chest pain and leg swelling.  Gastrointestinal: Negative for abdominal pain, constipation, diarrhea, nausea and vomiting.  Genitourinary: Negative for bladder incontinence, difficulty urinating, dysuria, frequency and hematuria.   Musculoskeletal:  Negative for back pain, gait problem, neck pain and neck stiffness.  Skin: Negative for itching and rash.  Neurological: Negative for dizziness, extremity weakness, gait problem, headaches, light-headedness and seizures.  Hematological: Negative for adenopathy. Does not bruise/bleed easily.  Psychiatric/Behavioral: Negative for confusion, depression and sleep disturbance. The patient is not nervous/anxious.     PHYSICAL EXAMINATION:  Blood pressure (!) 145/84, pulse 97, temperature 98.1 F (36.7 C), resp. rate 20, weight 130 lb 11.2 oz (59.3 kg), SpO2 98 %.  ECOG PERFORMANCE STATUS: 1  Physical Exam  Constitutional: Oriented to person, place, and time and well-developed, well-nourished, and in no distress.  HENT:  Head: Normocephalic and atraumatic.  Mouth/Throat: Oropharynx is clear and moist. No oropharyngeal exudate.  Eyes: Conjunctivae are normal. Right eye exhibits no discharge. Left eye exhibits no discharge. No scleral icterus.  Neck: Normal range of motion. Neck supple.  Cardiovascular: Normal rate, regular rhythm, normal heart sounds and intact distal pulses.   Pulmonary/Chest: Effort normal. Quiet breath sounds bilaterally. No respiratory distress. No wheezes. No rales.  Abdominal: Soft. Bowel sounds are normal. Exhibits no distension and no mass. There is no tenderness.  Musculoskeletal: Normal range of motion. Exhibits no edema.  Lymphadenopathy:    No cervical adenopathy.  Neurological: Alert and oriented to person, place, and time. Exhibits normal muscle tone. Gait normal. Coordination normal.  Skin: Skin is warm and dry. No rash noted. Not diaphoretic. No erythema. No pallor.  Psychiatric: Mood, memory and judgment normal.  Vitals reviewed.  LABORATORY DATA: Lab Results  Component Value Date   WBC 5.6 04/09/2021   HGB 11.9 (L) 04/09/2021   HCT 35.6 (L) 04/09/2021   MCV 84.0 04/09/2021   PLT 145 (L) 04/09/2021      Chemistry      Component Value Date/Time    NA 137 04/09/2021 1308   NA 144 02/10/2020 0813   K 3.6 04/09/2021 1308   CL 103 04/09/2021 1308   CO2 26 04/09/2021 1308   BUN 11 04/09/2021 1308   BUN 10 02/10/2020 0813   CREATININE 0.68 04/09/2021 1308   CREATININE 0.84 11/11/2019 1411      Component Value Date/Time   CALCIUM 9.2 04/09/2021 1308   ALKPHOS 89 04/09/2021 1308   AST 25 04/09/2021 1308   ALT 24 04/09/2021 1308   BILITOT 0.3 04/09/2021 1308       RADIOGRAPHIC STUDIES:  CT Angio Chest PE W and/or Wo Contrast  Result Date: 03/14/2021 CLINICAL DATA:  Pulmonary embolism (PE) suspected, unknown D-dimer. Shortness of breath. History of lung cancer. EXAM: CT ANGIOGRAPHY CHEST WITH CONTRAST TECHNIQUE: Multidetector CT imaging of the chest was performed using the standard protocol during bolus administration of intravenous contrast. Multiplanar CT image reconstructions and MIPs were obtained to evaluate the vascular  anatomy. CONTRAST:  36mL OMNIPAQUE IOHEXOL 350 MG/ML SOLN COMPARISON:  02/19/2021 FINDINGS: Cardiovascular: No filling defects in the pulmonary arteries to suggest pulmonary emboli. Heart is normal size. Aorta is normal caliber. Coronary artery and aortic calcifications. Mediastinum/Nodes: No mediastinal, hilar, or axillary adenopathy. Material noted in the distal trachea thyroid unremarkable. Lungs/Pleura: Improvement in aeration in the right upper lobe with re-expansion of the right upper lobe. Radiation changes noted medially within the right lung. Diffuse bronchial wall thickening on the right. Moderate emphysema. No effusions. Upper Abdomen: Imaging into the upper abdomen demonstrates no acute findings. Musculoskeletal: Chest wall soft tissues are unremarkable. No acute bony abnormality. Review of the MIP images confirms the above findings. IMPRESSION: No evidence of pulmonary embolus. Near complete re-expansion of the right upper lobe. Emphysema, bronchial wall thickening again noted, unchanged. Mucus/debris in the  distal trachea. Coronary artery disease. Aortic Atherosclerosis (ICD10-I70.0) and Emphysema (ICD10-J43.9). Electronically Signed   By: Rolm Baptise M.D.   On: 03/14/2021 15:33   DG Chest Port 1 View  Result Date: 03/14/2021 CLINICAL DATA:  Cough EXAM: PORTABLE CHEST 1 VIEW COMPARISON:  03/08/2021 FINDINGS: The heart size and mediastinal contours are within normal limits. Improved aeration right upper lobe. No pleural effusion. The visualized skeletal structures are unremarkable. IMPRESSION: Improved aeration right upper lobe with decreased collapse. Electronically Signed   By: Macy Mis M.D.   On: 03/14/2021 14:36     ASSESSMENT/PLAN:  This is a very pleasant 66 year old Caucasian female diagnosed with stage IIIa (Tx, N2, M0) non-small cell lung cancer, squamous cell carcinoma.  She presented with a subcarinal mass/lymphadenopathy.  She was diagnosed in July 2022.   The patient is not a candidate for surgical resection due to the location of the tumor.  Therefore, the patient completed a course of concurrent chemoradiation with weekly carboplatin for an AUC of 2 and paclitaxel 45 mg per metered square.  She is status post 7 cycles.  She had a repeat CT scan of the chest performed previously which showed improvement in the subcarinal lymph node which is a presentation of her disease.  Also showed collapse of the right middle lobe that was biopsy-proven to be consistent with malignancy  The patient is currently undergoing consolidation immunotherapy with Imfinzi 1500 mg IV every 4 weeks.  She status post 3 cycles.   She recently had a CTA performed while in the emergency room which did not show any evidence of disease progression. I reviewed this with Dr. Julien Nordmann yesterday. We will use this scan as her most recent restaging CT scan. No evidence of disease progression.    Labs were reviewed.  Recommend that she proceed cycle 4 today as scheduled.  We will see her back for follow-up visit in  4 weeks for evaluation before starting cycle #5.  She will follow-up with gastroenterology later this month for EGD and possible dilation.  I discussed with her that it is up to her based on her symptoms whether she wants to move forward with this procedure.  Discussed that some patients may have strictures as a sequela to radiation.   The patient was advised to call immediately if she has any concerning symptoms in the interval. The patient voices understanding of current disease status and treatment options and is in agreement with the current care plan. All questions were answered. The patient knows to call the clinic with any problems, questions or concerns. We can certainly see the patient much sooner if necessary  No orders of the defined types were placed in this encounter.    The total time spent in the appointment was 20-29 minutes.   Julia Alkhatib L Takota Cahalan, PA-C 04/09/21

## 2021-04-09 ENCOUNTER — Inpatient Hospital Stay: Payer: Medicare Other

## 2021-04-09 ENCOUNTER — Inpatient Hospital Stay: Payer: Medicare Other | Attending: Physician Assistant

## 2021-04-09 ENCOUNTER — Other Ambulatory Visit: Payer: Self-pay

## 2021-04-09 ENCOUNTER — Inpatient Hospital Stay (HOSPITAL_BASED_OUTPATIENT_CLINIC_OR_DEPARTMENT_OTHER): Payer: Medicare Other | Admitting: Physician Assistant

## 2021-04-09 VITALS — BP 145/84 | HR 97 | Temp 98.1°F | Resp 20 | Wt 130.7 lb

## 2021-04-09 DIAGNOSIS — Z79899 Other long term (current) drug therapy: Secondary | ICD-10-CM | POA: Diagnosis not present

## 2021-04-09 DIAGNOSIS — R5382 Chronic fatigue, unspecified: Secondary | ICD-10-CM

## 2021-04-09 DIAGNOSIS — Z5112 Encounter for antineoplastic immunotherapy: Secondary | ICD-10-CM

## 2021-04-09 DIAGNOSIS — C3491 Malignant neoplasm of unspecified part of right bronchus or lung: Secondary | ICD-10-CM

## 2021-04-09 LAB — CBC WITH DIFFERENTIAL (CANCER CENTER ONLY)
Abs Immature Granulocytes: 0.01 10*3/uL (ref 0.00–0.07)
Basophils Absolute: 0 10*3/uL (ref 0.0–0.1)
Basophils Relative: 0 %
Eosinophils Absolute: 0.1 10*3/uL (ref 0.0–0.5)
Eosinophils Relative: 1 %
HCT: 35.6 % — ABNORMAL LOW (ref 36.0–46.0)
Hemoglobin: 11.9 g/dL — ABNORMAL LOW (ref 12.0–15.0)
Immature Granulocytes: 0 %
Lymphocytes Relative: 17 %
Lymphs Abs: 1 10*3/uL (ref 0.7–4.0)
MCH: 28.1 pg (ref 26.0–34.0)
MCHC: 33.4 g/dL (ref 30.0–36.0)
MCV: 84 fL (ref 80.0–100.0)
Monocytes Absolute: 0.4 10*3/uL (ref 0.1–1.0)
Monocytes Relative: 6 %
Neutro Abs: 4.2 10*3/uL (ref 1.7–7.7)
Neutrophils Relative %: 76 %
Platelet Count: 145 10*3/uL — ABNORMAL LOW (ref 150–400)
RBC: 4.24 MIL/uL (ref 3.87–5.11)
RDW: 13.4 % (ref 11.5–15.5)
WBC Count: 5.6 10*3/uL (ref 4.0–10.5)
nRBC: 0 % (ref 0.0–0.2)

## 2021-04-09 LAB — CMP (CANCER CENTER ONLY)
ALT: 24 U/L (ref 0–44)
AST: 25 U/L (ref 15–41)
Albumin: 4.2 g/dL (ref 3.5–5.0)
Alkaline Phosphatase: 89 U/L (ref 38–126)
Anion gap: 8 (ref 5–15)
BUN: 11 mg/dL (ref 8–23)
CO2: 26 mmol/L (ref 22–32)
Calcium: 9.2 mg/dL (ref 8.9–10.3)
Chloride: 103 mmol/L (ref 98–111)
Creatinine: 0.68 mg/dL (ref 0.44–1.00)
GFR, Estimated: >60 mL/min (ref >60)
Glucose, Bld: 104 mg/dL — ABNORMAL HIGH (ref 70–99)
Potassium: 3.6 mmol/L (ref 3.5–5.1)
Sodium: 137 mmol/L (ref 135–145)
Total Bilirubin: 0.3 mg/dL (ref 0.3–1.2)
Total Protein: 6.9 g/dL (ref 6.5–8.1)

## 2021-04-09 LAB — TSH: TSH: <0.08 u[IU]/mL — ABNORMAL LOW (ref 0.308–3.960)

## 2021-04-09 MED ORDER — SODIUM CHLORIDE 0.9 % IV SOLN: Freq: Once | INTRAVENOUS | Status: AC

## 2021-04-09 MED ORDER — SODIUM CHLORIDE 0.9 % IV SOLN
1500.0000 mg | Freq: Once | INTRAVENOUS | Status: AC
  Administered 2021-04-09: 1500 mg via INTRAVENOUS
  Filled 2021-04-09: qty 30

## 2021-04-09 NOTE — Patient Instructions (Signed)
[facility] MEDICAL ONCOLOGY  Discharge Instructions: °Thank you for choosing [facility] to provide your oncology and hematology care.  ° °If you have a lab appointment with the Cancer Center, please go directly to the Cancer Center and check in at the registration area. °  °Wear comfortable clothing and clothing appropriate for easy access to any Portacath or PICC line.  ° °We strive to give you quality time with your provider. You may need to reschedule your appointment if you arrive late (15 or more minutes).  Arriving late affects you and other patients whose appointments are after yours.  Also, if you miss three or more appointments without notifying the office, you may be dismissed from the clinic at the provider’s discretion.    °  °For prescription refill requests, have your pharmacy contact our office and allow 72 hours for refills to be completed.   ° °Today you received the following chemotherapy and/or immunotherapy agents :  Durvalumab °    °  °To help prevent nausea and vomiting after your treatment, we encourage you to take your nausea medication as directed. ° °BELOW ARE SYMPTOMS THAT SHOULD BE REPORTED IMMEDIATELY: °*FEVER GREATER THAN 100.4 F (38 °C) OR HIGHER °*CHILLS OR SWEATING °*NAUSEA AND VOMITING THAT IS NOT CONTROLLED WITH YOUR NAUSEA MEDICATION °*UNUSUAL SHORTNESS OF BREATH °*UNUSUAL BRUISING OR BLEEDING °*URINARY PROBLEMS (pain or burning when urinating, or frequent urination) °*BOWEL PROBLEMS (unusual diarrhea, constipation, pain near the anus) °TENDERNESS IN MOUTH AND THROAT WITH OR WITHOUT PRESENCE OF ULCERS (sore throat, sores in mouth, or a toothache) °UNUSUAL RASH, SWELLING OR PAIN  °UNUSUAL VAGINAL DISCHARGE OR ITCHING  ° °Items with * indicate a potential emergency and should be followed up as soon as possible or go to the Emergency Department if any problems should occur. ° °Please show the CHEMOTHERAPY ALERT CARD or IMMUNOTHERAPY ALERT CARD at  check-in to the Emergency Department and triage nurse. ° °Should you have questions after your visit or need to cancel or reschedule your appointment, please contact [facility]  Dept: [phone]  and follow the prompts.  Office hours are 8:00 a.m. to 4:30 p.m. Monday - Friday. Please note that voicemails left after 4:00 p.m. may not be returned until the following business day.  We are closed weekends and major holidays. You have access to a nurse at all times for urgent questions. Please call the main number to the clinic Dept: [phone] and follow the prompts. ° ° °For any non-urgent questions, you may also contact your provider using MyChart. We now offer e-Visits for anyone 18 and older to request care online for non-urgent symptoms. For details visit [email or web address]. °  °Also download the MyChart app! Go to the app store, search "MyChart", open the app, select [place], and log in with your MyChart username and password. ° °Due to Covid, a mask is required upon entering the hospital/clinic. If you do not have a mask, one will be given to you upon arrival. For doctor visits, patients may have 1 support person aged 18 or older with them. For treatment visits, patients cannot have anyone with them due to current Covid guidelines and our immunocompromised population.  ° °

## 2021-04-13 ENCOUNTER — Telehealth: Payer: Self-pay | Admitting: Physician Assistant

## 2021-04-13 NOTE — Telephone Encounter (Signed)
Rescheduled appointment per 1/10 scheduling message and per patient's request. Patient is aware of the changes made to her appointment.

## 2021-04-15 ENCOUNTER — Ambulatory Visit: Payer: Medicare Other | Admitting: Gastroenterology

## 2021-04-20 ENCOUNTER — Encounter (HOSPITAL_COMMUNITY): Payer: Self-pay | Admitting: Gastroenterology

## 2021-04-23 ENCOUNTER — Other Ambulatory Visit: Payer: Self-pay

## 2021-04-23 ENCOUNTER — Ambulatory Visit (INDEPENDENT_AMBULATORY_CARE_PROVIDER_SITE_OTHER): Payer: Medicare Other | Admitting: Pulmonary Disease

## 2021-04-23 ENCOUNTER — Encounter: Payer: Self-pay | Admitting: Pulmonary Disease

## 2021-04-23 VITALS — BP 130/74 | HR 93 | Temp 98.2°F | Ht 64.0 in | Wt 131.6 lb

## 2021-04-23 DIAGNOSIS — J9819 Other pulmonary collapse: Secondary | ICD-10-CM

## 2021-04-23 DIAGNOSIS — R7989 Other specified abnormal findings of blood chemistry: Secondary | ICD-10-CM | POA: Diagnosis not present

## 2021-04-23 DIAGNOSIS — C3491 Malignant neoplasm of unspecified part of right bronchus or lung: Secondary | ICD-10-CM

## 2021-04-23 DIAGNOSIS — R0602 Shortness of breath: Secondary | ICD-10-CM

## 2021-04-23 DIAGNOSIS — J479 Bronchiectasis, uncomplicated: Secondary | ICD-10-CM

## 2021-04-23 DIAGNOSIS — R590 Localized enlarged lymph nodes: Secondary | ICD-10-CM

## 2021-04-23 DIAGNOSIS — J449 Chronic obstructive pulmonary disease, unspecified: Secondary | ICD-10-CM

## 2021-04-23 NOTE — H&P (View-Only) (Signed)
Synopsis: Referred in January 2021 for former smoker quit 2014 moderate COPD, MAI by Carlena Hurl, PA-C  Subjective:   PATIENT ID: Katrina Campbell GENDER: female DOB: November 20, 1955, MRN: [id number]  Chief Complaint  Patient presents with   Follow-up    Follow up. Patient is doing well, no complaints.      This is a 66 year old female moderate COPD, former smoker quit 2014, MAI patient last seen in the office by Dr. Vaughan Browner.  Former Dr. Lenna Gilford patient.  Treated for stenotrophomonas in the past.  Prior 3 sputum's AFB, 1/3+ for MAI.  Saw infectious disease to discuss initiation of therapy.  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  Patient was last seen by infectious disease on 04/15/2019.  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  Just started on azithromycin 500 mg daily, rifampin 600 mg daily plus ethambutol 900 mg daily.  Patient does have CT imaging with lower lobe bronchiectasis.  Patient has chronic sputum production.  She is short of breath with daily cough and sputum production.  During this time she is very anxious and hesitant about going out in public due to her chronic cough.  She denies hemoptysis patient's weight has also been stable.  OV 11/06/2019: around the 4th of July patient had congestion and cough that still hasnt really gone away.  Overall doing much better now.  Has less cough and congestion.  Recent follow-up with infectious disease plan for Monday.  Been compliant with her medications.  Still has some daily sputum production.  Rarely has a streak of blood with cough.  OV 04/14/2020: Here today for follow-up.  Establish care with me back last year.  Was already under treatment for her MAI.  Followed up with infectious disease here in [place] as well as met with ID at [facility].  Decision was made to come off of treatments.   Recent sputum's have been negative.  She does have significant emphysema currently compliant with inhaler regimen.  From a respiratory standpoint she is doing fine.  No significant symptom change after stopping antibiotics.   OV 10/01/2020: Here today for follow-up had abnormal lung cancer screening CT follow-up with an enlarged mediastinal node.  Patient was sent for nuclear medicine PET scan.  PET scan revealed a significantly PET avid station 7 subcarinal node with SUV of 11.  We reviewed this today in the office.  Concerning for underlying malignancy.  OV 10/09/2020: Here today for follow-up.  Recent CT scan of the chest had enlarged mediastinal node.  PET scan with PET avid station 7 and irregular lining of the right mainstem.  Patient was taken for video bronchoscopy on 10/08/2020.  Bronchoscopy revealed enlarged subcarinal adenopathy which was sampled under ultrasound guidance.  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  Endobronchial brushings, forcep biopsies of the abnormal mucosa and visible tumor were taken.  OV 02/10/2021: having difficulty breathing, shes is more short of breath.  She was taken for bronchoscopy recently with repeat biopsy which did show persistence of her malignancy.  Her culture results also were positive again for MAI.  I did reach out to infectious disease today for her symptoms should consider treatment options again.  Especially in lieu of of her immune suppression receiving intermittent therapy.  OV 03/03/2021: having lots of difficulty sob. Also she feels  like food sometimes gets stuck with swallowing.  Overall feels anxious that her shortness of breath is not getting much better.  Saw infectious disease in, and once again and agreed to continue to hold therapy for MAI.  I agree she does not have a lot of radiographic evidence of MAI.  She still has significant amount of sputum  production.  She has not been using her vest therapy too much because it worsens back pain.  Trying to use her flutter valve.  OV 04/23/2021: Here today for follow up.  Recently had a chest x-ray with right upper lobe collapse persistent.  She ultimately end up going to the ER having an episode of feeling choked and ultimately what sounds like she passed a mucous plug.  Repeat imaging shows reinflation of the right upper lobe.  She feels much better after all this happened.  Started immunotherapy and has been tolerating that well with medical oncology.  Encouraged her to continue to use her breathing treatments and flutter valve to help with airway clearance techniques.     Past Medical History:  Diagnosis Date   Anxiety    COPD (chronic obstructive pulmonary disease) ([place])    Coronary artery disease    Dental staining 07/15/2019   Depression    History of radiation therapy    Right lung- 11/03/20-12/15/20- Dr. Gery Pray   Hyperlipidemia    Labile hypertension    Myocardial infarction [facility]) 2018   Pneumonia    Pulmonary mycobacterial infection ([place]) 10/24/2018     Family History  Problem Relation Age of Onset   Heart attack Mother    Emphysema Mother    Congestive Heart Failure Mother    Heart attack Father    Congestive Heart Failure Father    Asthma Other    Lung disease Sister    Lung disease Brother    Colon cancer Neg Hx      Past Surgical History:  Procedure Laterality Date   BIOPSY  01/14/2021   Procedure: BIOPSY;  Surgeon: Garner Nash, DO;  Location: [place] [facility];  Service: Pulmonary;;   BRONCHIAL BIOPSY  10/08/2020   Procedure: BRONCHIAL BIOPSIES;  Surgeon: Garner Nash, DO;  Location: [place];  Service: Pulmonary;;   BRONCHIAL BRUSHINGS  10/08/2020   Procedure: BRONCHIAL BRUSHINGS;  Surgeon: Garner Nash, DO;  Location: [place] [facility];  Service: Pulmonary;;   BRONCHIAL BRUSHINGS  01/14/2021   Procedure: BRONCHIAL BRUSHINGS;  Surgeon: Garner Nash, DO;  Location: [place] [facility];  Service: Pulmonary;;   BRONCHIAL WASHINGS  10/08/2020   Procedure: BRONCHIAL WASHINGS;  Surgeon: Garner Nash, DO;  Location: [place] [facility];  Service: Pulmonary;;   BRONCHIAL WASHINGS  01/14/2021   Procedure: BRONCHIAL WASHINGS;  Surgeon: Garner Nash, DO;  Location: [place];  Service: Pulmonary;;   FINE NEEDLE ASPIRATION  10/08/2020   Procedure: FINE NEEDLE ASPIRATION (FNA) LINEAR;  Surgeon: Garner Nash, DO;  Location: [place] [facility];  Service: Pulmonary;;   LEFT HEART CATH AND CORONARY ANGIOGRAPHY N/A 11/07/2017   Procedure: LEFT HEART CATH AND CORONARY ANGIOGRAPHY;  Surgeon: Nelva Bush, MD;  Location: [place] [facility];  Service: Cardiovascular;  Laterality: N/A;   NASAL SINUS SURGERY  1986   VIDEO BRONCHOSCOPY Bilateral 01/29/2019   Procedure: VIDEO BRONCHOSCOPY WITH FLUORO;  Surgeon: Marshell Garfinkel, MD;  Location: [place];  Service: Cardiopulmonary;  Laterality: Bilateral;   VIDEO BRONCHOSCOPY Right 01/14/2021   Procedure: VIDEO BRONCHOSCOPY WITHOUT FLUORO;  Surgeon: Garner Nash, DO;  Location:  [place] [facility];  Service: Pulmonary;  Laterality: Right;  possible cryotherapy   VIDEO BRONCHOSCOPY WITH ENDOBRONCHIAL ULTRASOUND N/A 10/08/2020   Procedure: VIDEO BRONCHOSCOPY WITH ENDOBRONCHIAL ULTRASOUND;  Surgeon: Garner Nash, DO;  Location: [place];  Service: Pulmonary;  Laterality: N/A;    Social History   Socioeconomic History   Marital status: Married    Spouse name: Not on file   Number of children: Not on file   Years of education: Not on file   Highest education level: Not on file  Occupational History   Not on file  Tobacco Use   Smoking status: Former    Packs/day: 1.75    Years: 44.00    Pack years: 77.00    Types: Cigarettes    Quit date: 11/09/2012    Years since quitting: 8.4   Smokeless tobacco: Never   Tobacco comments:    vapor  Vaping Use   Vaping Use: Former  Substance and Sexual Activity    Alcohol use: Not Currently    Alcohol/week: 24.0 standard drinks    Types: 24 Cans of beer per week   Drug use: Not Currently   Sexual activity: Not Currently  Other Topics Concern   Not on file  Social History Narrative   Not on file   Social Determinants of Health   Financial Resource Strain: Medium Risk   Difficulty of Paying Living Expenses: Somewhat hard  Food Insecurity: No Food Insecurity   Worried About Charity fundraiser in the Last Year: Never true   Ran Out of Food in the Last Year: Never true  Transportation Needs: No Transportation Needs   Lack of Transportation (Medical): No   Lack of Transportation (Non-Medical): No  Physical Activity: Not on file  Stress: Stress Concern Present   Feeling of Stress : Rather much  Social Connections: Unknown   Frequency of Communication with Friends and Family: More than three times a week   Frequency of Social Gatherings with Friends and Family: More than three times a week   Attends Religious Services: More than 4 times per year   Active Member of [employer] or Organizations: Not on file   Attends Archivist Meetings: Not on file   Marital Status: Married  Human resources officer Violence: Not on file     Allergies  Allergen Reactions   Data processing manager [Budeson-Glycopyrrol-Formoterol] Shortness Of Breath   Benadryl [Diphenhydramine] Nausea Only   Codeine Other (See Comments)    Causes pt to blackout   Prednisone Other (See Comments)    Passed out   Statins     Myalgias at higher doses      Outpatient Medications Prior to Visit  Medication Sig Dispense Refill   acetaminophen (TYLENOL) 500 MG tablet Take 500 mg by mouth every 6 (six) hours as needed for moderate pain or headache.     albuterol (PROVENTIL) (2.5 MG/3ML) 0.083% nebulizer solution Take 3 mLs (2.5 mg total) by nebulization every 6 (six) hours as needed for wheezing or shortness of breath. (Patient taking differently: Take 2.5 mg by nebulization 3 (three)  times daily.) 540 mL 3   albuterol (VENTOLIN HFA) 108 (90 Base) MCG/ACT inhaler Inhale 2 puffs into the lungs every 6 (six) hours as needed for wheezing or shortness of breath.     aspirin EC 81 MG tablet Take 81 mg by mouth daily with lunch.      buPROPion (WELLBUTRIN XL) 300 MG 24 hr tablet Take 1 tablet (300 mg total) by mouth  daily. 90 tablet 0   Cholecalciferol (DIALYVITE VITAMIN D 5000) 125 MCG (5000 UT) capsule Take 5,000 Units by mouth daily.     esomeprazole (NEXIUM) 40 MG capsule Take 1 capsule (40 mg total) by mouth daily. 90 capsule 3   ezetimibe (ZETIA) 10 MG tablet Take 1 tablet (10 mg total) by mouth daily. 90 tablet 1   Fluticasone-Umeclidin-Vilant (TRELEGY ELLIPTA) 100-62.5-25 MCG/ACT AEPB Inhale 1 puff into the lungs daily. 180 each 3   Guaifenesin (MUCINEX MAXIMUM STRENGTH) 1200 MG TB12 Take 1,200 mg by mouth 2 (two) times daily.     Magnesium 500 MG TABS Take 500 mg by mouth daily with lunch.     Multiple Vitamins-Minerals (IMMUNE SUPPORT PO) Take 2 tablets by mouth daily.     Respiratory Therapy Supplies (FLUTTER) DEVI Use as directed 1 each 0   rosuvastatin (CRESTOR) 10 MG tablet Take 1 tablet (10 mg total) by mouth daily. 90 tablet 3   sodium chloride (OCEAN) 0.65 % SOLN nasal spray Place 1 spray into both nostrils daily as needed for congestion.     temazepam (RESTORIL) 15 MG capsule Take 1 capsule (15 mg total) by mouth at bedtime as needed. (Patient taking differently: Take 15 mg by mouth at bedtime.) 30 capsule 2   vitamin B-12 (CYANOCOBALAMIN) 1000 MCG tablet Take 1,000 mcg by mouth daily.     Facility-Administered Medications Prior to Visit  Medication Dose Route Frequency Provider Last Rate Last Admin   Sonafine emulsion 1 application  1 application Topical Once Gery Pray, MD        Review of Systems  Constitutional:  Negative for chills, fever, malaise/fatigue and weight loss.  HENT:  Negative for hearing loss, sore throat and tinnitus.   Eyes:  Negative  for blurred vision and double vision.  Respiratory:  Positive for cough and shortness of breath. Negative for hemoptysis, sputum production, wheezing and stridor.   Cardiovascular:  Negative for chest pain, palpitations, orthopnea, leg swelling and PND.  Gastrointestinal:  Negative for abdominal pain, constipation, diarrhea, heartburn, nausea and vomiting.  Genitourinary:  Negative for dysuria, hematuria and urgency.  Musculoskeletal:  Negative for joint pain and myalgias.  Skin:  Negative for itching and rash.  Neurological:  Negative for dizziness, tingling, weakness and headaches.  Endo/Heme/Allergies:  Negative for environmental allergies. Does not bruise/bleed easily.  Psychiatric/Behavioral:  Negative for depression. The patient is not nervous/anxious and does not have insomnia.   All other systems reviewed and are negative.   Objective:  Physical Exam Vitals reviewed.  Constitutional:      General: She is not in acute distress.    Appearance: She is well-developed.  HENT:     Head: Normocephalic and atraumatic.  Eyes:     General: No scleral icterus.    Conjunctiva/sclera: Conjunctivae normal.     Pupils: Pupils are equal, round, and reactive to light.  Neck:     Vascular: No JVD.     Trachea: No tracheal deviation.  Cardiovascular:     Rate and Rhythm: Normal rate and regular rhythm.     Heart sounds: Normal heart sounds. No murmur heard. Pulmonary:     Effort: Pulmonary effort is normal. No tachypnea, accessory muscle usage or respiratory distress.     Breath sounds: No stridor. No wheezing, rhonchi or rales.  Abdominal:     General: There is no distension.     Palpations: Abdomen is soft.     Tenderness: There is no abdominal tenderness.  Musculoskeletal:  General: No tenderness.     Cervical back: Neck supple.  Lymphadenopathy:     Cervical: No cervical adenopathy.  Skin:    General: Skin is warm and dry.     Capillary Refill: Capillary refill takes less  than 2 seconds.     Findings: No rash.  Neurological:     Mental Status: She is alert and oriented to person, place, and time.  Psychiatric:        Behavior: Behavior normal.     Vitals:   04/23/21 1335  BP: 130/74  Pulse: 93  Temp: 98.2 F (36.8 C)  TempSrc: Oral  SpO2: 95%  Weight: 131 lb 9.6 oz (59.7 kg)  Height: _0  (1.626 m)    95% on RA BMI Readings from Last 3 Encounters:  04/23/21 22.59 kg/m  04/09/21 22.43 kg/m  03/17/21 22.80 kg/m   Wt Readings from Last 3 Encounters:  04/23/21 131 lb 9.6 oz (59.7 kg)  04/09/21 130 lb 11.2 oz (59.3 kg)  03/17/21 132 lb 12.8 oz (60.2 kg)     CBC    Component Value Date/Time   WBC 5.6 04/09/2021 1308   WBC 5.2 03/14/2021 1412   RBC 4.24 04/09/2021 1308   HGB 11.9 (L) 04/09/2021 1308   HGB 13.7 09/22/2020 1325   HCT 35.6 (L) 04/09/2021 1308   HCT 41.2 09/22/2020 1325   PLT 145 (L) 04/09/2021 1308   PLT 143 (L) 09/22/2020 1325   MCV 84.0 04/09/2021 1308   MCV 89 09/22/2020 1325   MCH 28.1 04/09/2021 1308   MCHC 33.4 04/09/2021 1308   RDW 13.4 04/09/2021 1308   RDW 12.9 09/22/2020 1325   LYMPHSABS 1.0 04/09/2021 1308   LYMPHSABS 2.1 09/22/2020 1325   MONOABS 0.4 04/09/2021 1308   EOSABS 0.1 04/09/2021 1308   EOSABS 0.1 09/22/2020 1325   BASOSABS 0.0 04/09/2021 1308   BASOSABS 0.0 09/22/2020 1325    Chest Imaging:  May 2020 CT chest: Scattered nodules.  Lower lobe small areas of bronchiectasis. The patient's images have been independently reviewed by me.   PET scan 09/29/2020: SUV of 11, subcarinal adenopathy concerning for malignancy. The patient's images have been independently reviewed by me.    Chest x-ray 02/09/2021 CT chest 02/19/2021: Right upper lobe collapse.  Which is new from previous chest x-ray imaging.  CT chest 03/14/2021: Resolution of the right upper lobe collapse, mucous and tracheal debris. The patient's images have been independently reviewed by me.    Pulmonary Functions  Testing Results: No flowsheet data found.  FeNO: none   Pathology: none   Echocardiogram: none  Heart Catheterization: none     Assessment & Plan:     ICD-10-CM   1. NSCLC of right lung ([place])  C34.91     2. Low TSH level  R79.89 Thyroid Cascade Profile    CANCELED: Thyroid Cascade Profile    3. Stage 2 moderate COPD by GOLD classification ([place])  J44.9     4. 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 Lung collapse  J98.19     6. Lymphadenopathy, mediastinal  R59.0     7. Shortness of breath  R06.02     8. Bronchiectasis without complication ([place])  H06.2        Discussion:  This is a 66 year old female, moderate COPD FEV1 of 66%, longstanding history of smoking was enrolled in a lung cancer screening program found to have an enlarged subcarinal node taken for bronchoscopy and diagnosed with  non-small cell lung cancer treated with chemotherapy and radiation.  She found to have right middle lobe collapse taken for bronchoscopy found to have irregular mucosa and no significant endobronchial disease after treatments.  She had a right upper lobe collapse likely due to mucous plugging with repeat CT that showed resolution.  She also has bronchiectasis and has a daily airway clearance routine.  Also has been seen by infectious disease in the past.  At this point restarted on immune therapy and tolerating it well.  Plan: No need for bronchoscopy at this time. Her right upper lobe is reinflated. Continue to monitor symptoms and continue airway clearance techniques for the maintenance of her bronchiectasis and MAI. Continue follow-up with infectious disease Continue follow-up with medical oncology for immune therapy treatment. Continue Trelegy for COPD management. She has a dysphagia evaluation by GI and a EGD planned next week.  Return to clinic to see [place] in 3 months or as needed.    Current Outpatient Medications:    acetaminophen (TYLENOL) 500 MG tablet, Take 500 mg  by mouth every 6 (six) hours as needed for moderate pain or headache., Disp: , Rfl:    albuterol (PROVENTIL) (2.5 MG/3ML) 0.083% nebulizer solution, Take 3 mLs (2.5 mg total) by nebulization every 6 (six) hours as needed for wheezing or shortness of breath. (Patient taking differently: Take 2.5 mg by nebulization 3 (three) times daily.), Disp: 540 mL, Rfl: 3   albuterol (VENTOLIN HFA) 108 (90 Base) MCG/ACT inhaler, Inhale 2 puffs into the lungs every 6 (six) hours as needed for wheezing or shortness of breath., Disp: , Rfl:    aspirin EC 81 MG tablet, Take 81 mg by mouth daily with lunch. , Disp: , Rfl:    buPROPion (WELLBUTRIN XL) 300 MG 24 hr tablet, Take 1 tablet (300 mg total) by mouth daily., Disp: 90 tablet, Rfl: 0   Cholecalciferol (DIALYVITE VITAMIN D 5000) 125 MCG (5000 UT) capsule, Take 5,000 Units by mouth daily., Disp: , Rfl:    esomeprazole (NEXIUM) 40 MG capsule, Take 1 capsule (40 mg total) by mouth daily., Disp: 90 capsule, Rfl: 3   ezetimibe (ZETIA) 10 MG tablet, Take 1 tablet (10 mg total) by mouth daily., Disp: 90 tablet, Rfl: 1   Fluticasone-Umeclidin-Vilant (TRELEGY ELLIPTA) 100-62.5-25 MCG/ACT AEPB, Inhale 1 puff into the lungs daily., Disp: 180 each, Rfl: 3   Guaifenesin (MUCINEX MAXIMUM STRENGTH) 1200 MG TB12, Take 1,200 mg by mouth 2 (two) times daily., Disp: , Rfl:    Magnesium 500 MG TABS, Take 500 mg by mouth daily with lunch., Disp: , Rfl:    Multiple Vitamins-Minerals (IMMUNE SUPPORT PO), Take 2 tablets by mouth daily., Disp: , Rfl:    Respiratory Therapy Supplies (FLUTTER) DEVI, Use as directed, Disp: 1 each, Rfl: 0   rosuvastatin (CRESTOR) 10 MG tablet, Take 1 tablet (10 mg total) by mouth daily., Disp: 90 tablet, Rfl: 3   sodium chloride (OCEAN) 0.65 % SOLN nasal spray, Place 1 spray into both nostrils daily as needed for congestion., Disp: , Rfl:    temazepam (RESTORIL) 15 MG capsule, Take 1 capsule (15 mg total) by mouth at bedtime as needed. (Patient taking  differently: Take 15 mg by mouth at bedtime.), Disp: 30 capsule, Rfl: 2   vitamin B-12 (CYANOCOBALAMIN) 1000 MCG tablet, Take 1,000 mcg by mouth daily., Disp: , Rfl:  No current facility-administered medications for this visit.  Facility-Administered Medications Ordered in Other Visits:    Sonafine emulsion 1 application, 1 application, Topical,  Once, Gery Pray, MD    Garner Nash, DO [facility] 04/23/2021 1:50 PM

## 2021-04-23 NOTE — Patient Instructions (Addendum)
Thank you for visiting Dr. Valeta Harms at [facility]. Today we recommend the following:  Orders Placed This Encounter  Procedures   Thyroid Cascade Profile   Return in about 3 months (around 07/22/2021) for with APP or Dr. Valeta Harms.    Please do your part to reduce the spread of COVID-19.

## 2021-04-23 NOTE — Progress Notes (Signed)
Synopsis: Referred in January 2021 for former smoker quit 2014 moderate COPD, MAI by Carlena Hurl, PA-C  Subjective:   PATIENT ID: Katrina Campbell GENDER: female DOB: 1955-06-24, MRN: [id number]  Chief Complaint  Patient presents with   Follow-up    Follow up. Patient is doing well, no complaints.      This is a 66 year old female moderate COPD, former smoker quit 2014, MAI patient last seen in the office by Dr. Vaughan Browner.  Former Dr. Lenna Gilford patient.  Treated for stenotrophomonas in the past.  Prior 3 sputum's AFB, 1/3+ for MAI.  Saw infectious disease to discuss initiation of therapy.  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  Patient was last seen by infectious disease on 04/15/2019.  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  Just started on azithromycin 500 mg daily, rifampin 600 mg daily plus ethambutol 900 mg daily.  Patient does have CT imaging with lower lobe bronchiectasis.  Patient has chronic sputum production.  She is short of breath with daily cough and sputum production.  During this time she is very anxious and hesitant about going out in public due to her chronic cough.  She denies hemoptysis patient's weight has also been stable.  OV 11/06/2019: around the 4th of July patient had congestion and cough that still hasnt really gone away.  Overall doing much better now.  Has less cough and congestion.  Recent follow-up with infectious disease plan for Monday.  Been compliant with her medications.  Still has some daily sputum production.  Rarely has a streak of blood with cough.  OV 04/14/2020: Here today for follow-up.  Establish care with me back last year.  Was already under treatment for her MAI.  Followed up with infectious disease here in [place] as well as met with ID at [facility].  Decision was made to come off of treatments.   Recent sputum's have been negative.  She does have significant emphysema currently compliant with inhaler regimen.  From a respiratory standpoint she is doing fine.  No significant symptom change after stopping antibiotics.   OV 10/01/2020: Here today for follow-up had abnormal lung cancer screening CT follow-up with an enlarged mediastinal node.  Patient was sent for nuclear medicine PET scan.  PET scan revealed a significantly PET avid station 7 subcarinal node with SUV of 11.  We reviewed this today in the office.  Concerning for underlying malignancy.  OV 10/09/2020: Here today for follow-up.  Recent CT scan of the chest had enlarged mediastinal node.  PET scan with PET avid station 7 and irregular lining of the right mainstem.  Patient was taken for video bronchoscopy on 10/08/2020.  Bronchoscopy revealed enlarged subcarinal adenopathy which was sampled under ultrasound guidance.  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  Endobronchial brushings, forcep biopsies of the abnormal mucosa and visible tumor were taken.  OV 02/10/2021: having difficulty breathing, shes is more short of breath.  She was taken for bronchoscopy recently with repeat biopsy which did show persistence of her malignancy.  Her culture results also were positive again for MAI.  I did reach out to infectious disease today for her symptoms should consider treatment options again.  Especially in lieu of of her immune suppression receiving intermittent therapy.  OV 03/03/2021: having lots of difficulty sob. Also she feels  like food sometimes gets stuck with swallowing.  Overall feels anxious that her shortness of breath is not getting much better.  Saw infectious disease in, and once again and agreed to continue to hold therapy for MAI.  I agree she does not have a lot of radiographic evidence of MAI.  She still has significant amount of sputum  production.  She has not been using her vest therapy too much because it worsens back pain.  Trying to use her flutter valve.  OV 04/23/2021: Here today for follow up.  Recently had a chest x-ray with right upper lobe collapse persistent.  She ultimately end up going to the ER having an episode of feeling choked and ultimately what sounds like she passed a mucous plug.  Repeat imaging shows reinflation of the right upper lobe.  She feels much better after all this happened.  Started immunotherapy and has been tolerating that well with medical oncology.  Encouraged her to continue to use her breathing treatments and flutter valve to help with airway clearance techniques.     Past Medical History:  Diagnosis Date   Anxiety    COPD (chronic obstructive pulmonary disease) ([place])    Coronary artery disease    Dental staining 07/15/2019   Depression    History of radiation therapy    Right lung- 11/03/20-12/15/20- Dr. Gery Pray   Hyperlipidemia    Labile hypertension    Myocardial infarction [facility]) 2018   Pneumonia    Pulmonary mycobacterial infection ([place]) 10/24/2018     Family History  Problem Relation Age of Onset   Heart attack Mother    Emphysema Mother    Congestive Heart Failure Mother    Heart attack Father    Congestive Heart Failure Father    Asthma Other    Lung disease Sister    Lung disease Brother    Colon cancer Neg Hx      Past Surgical History:  Procedure Laterality Date   BIOPSY  01/14/2021   Procedure: BIOPSY;  Surgeon: Garner Nash, DO;  Location: [place] [facility];  Service: Pulmonary;;   BRONCHIAL BIOPSY  10/08/2020   Procedure: BRONCHIAL BIOPSIES;  Surgeon: Garner Nash, DO;  Location: [place];  Service: Pulmonary;;   BRONCHIAL BRUSHINGS  10/08/2020   Procedure: BRONCHIAL BRUSHINGS;  Surgeon: Garner Nash, DO;  Location: [place] [facility];  Service: Pulmonary;;   BRONCHIAL BRUSHINGS  01/14/2021   Procedure: BRONCHIAL BRUSHINGS;  Surgeon: Garner Nash, DO;  Location: [place] [facility];  Service: Pulmonary;;   BRONCHIAL WASHINGS  10/08/2020   Procedure: BRONCHIAL WASHINGS;  Surgeon: Garner Nash, DO;  Location: [place] [facility];  Service: Pulmonary;;   BRONCHIAL WASHINGS  01/14/2021   Procedure: BRONCHIAL WASHINGS;  Surgeon: Garner Nash, DO;  Location: [place];  Service: Pulmonary;;   FINE NEEDLE ASPIRATION  10/08/2020   Procedure: FINE NEEDLE ASPIRATION (FNA) LINEAR;  Surgeon: Garner Nash, DO;  Location: [place] [facility];  Service: Pulmonary;;   LEFT HEART CATH AND CORONARY ANGIOGRAPHY N/A 11/07/2017   Procedure: LEFT HEART CATH AND CORONARY ANGIOGRAPHY;  Surgeon: Nelva Bush, MD;  Location: [place] [facility];  Service: Cardiovascular;  Laterality: N/A;   NASAL SINUS SURGERY  1986   VIDEO BRONCHOSCOPY Bilateral 01/29/2019   Procedure: VIDEO BRONCHOSCOPY WITH FLUORO;  Surgeon: Marshell Garfinkel, MD;  Location: [place];  Service: Cardiopulmonary;  Laterality: Bilateral;   VIDEO BRONCHOSCOPY Right 01/14/2021   Procedure: VIDEO BRONCHOSCOPY WITHOUT FLUORO;  Surgeon: Garner Nash, DO;  Location:  [place] [facility];  Service: Pulmonary;  Laterality: Right;  possible cryotherapy   VIDEO BRONCHOSCOPY WITH ENDOBRONCHIAL ULTRASOUND N/A 10/08/2020   Procedure: VIDEO BRONCHOSCOPY WITH ENDOBRONCHIAL ULTRASOUND;  Surgeon: Garner Nash, DO;  Location: [place];  Service: Pulmonary;  Laterality: N/A;    Social History   Socioeconomic History   Marital status: Married    Spouse name: Not on file   Number of children: Not on file   Years of education: Not on file   Highest education level: Not on file  Occupational History   Not on file  Tobacco Use   Smoking status: Former    Packs/day: 1.75    Years: 44.00    Pack years: 77.00    Types: Cigarettes    Quit date: 11/09/2012    Years since quitting: 8.4   Smokeless tobacco: Never   Tobacco comments:    vapor  Vaping Use   Vaping Use: Former  Substance and Sexual Activity    Alcohol use: Not Currently    Alcohol/week: 24.0 standard drinks    Types: 24 Cans of beer per week   Drug use: Not Currently   Sexual activity: Not Currently  Other Topics Concern   Not on file  Social History Narrative   Not on file   Social Determinants of Health   Financial Resource Strain: Medium Risk   Difficulty of Paying Living Expenses: Somewhat hard  Food Insecurity: No Food Insecurity   Worried About Charity fundraiser in the Last Year: Never true   Ran Out of Food in the Last Year: Never true  Transportation Needs: No Transportation Needs   Lack of Transportation (Medical): No   Lack of Transportation (Non-Medical): No  Physical Activity: Not on file  Stress: Stress Concern Present   Feeling of Stress : Rather much  Social Connections: Unknown   Frequency of Communication with Friends and Family: More than three times a week   Frequency of Social Gatherings with Friends and Family: More than three times a week   Attends Religious Services: More than 4 times per year   Active Member of [employer] or Organizations: Not on file   Attends Archivist Meetings: Not on file   Marital Status: Married  Human resources officer Violence: Not on file     Allergies  Allergen Reactions   Data processing manager [Budeson-Glycopyrrol-Formoterol] Shortness Of Breath   Benadryl [Diphenhydramine] Nausea Only   Codeine Other (See Comments)    Causes pt to blackout   Prednisone Other (See Comments)    Passed out   Statins     Myalgias at higher doses      Outpatient Medications Prior to Visit  Medication Sig Dispense Refill   acetaminophen (TYLENOL) 500 MG tablet Take 500 mg by mouth every 6 (six) hours as needed for moderate pain or headache.     albuterol (PROVENTIL) (2.5 MG/3ML) 0.083% nebulizer solution Take 3 mLs (2.5 mg total) by nebulization every 6 (six) hours as needed for wheezing or shortness of breath. (Patient taking differently: Take 2.5 mg by nebulization 3 (three)  times daily.) 540 mL 3   albuterol (VENTOLIN HFA) 108 (90 Base) MCG/ACT inhaler Inhale 2 puffs into the lungs every 6 (six) hours as needed for wheezing or shortness of breath.     aspirin EC 81 MG tablet Take 81 mg by mouth daily with lunch.      buPROPion (WELLBUTRIN XL) 300 MG 24 hr tablet Take 1 tablet (300 mg total) by mouth  daily. 90 tablet 0   Cholecalciferol (DIALYVITE VITAMIN D 5000) 125 MCG (5000 UT) capsule Take 5,000 Units by mouth daily.     esomeprazole (NEXIUM) 40 MG capsule Take 1 capsule (40 mg total) by mouth daily. 90 capsule 3   ezetimibe (ZETIA) 10 MG tablet Take 1 tablet (10 mg total) by mouth daily. 90 tablet 1   Fluticasone-Umeclidin-Vilant (TRELEGY ELLIPTA) 100-62.5-25 MCG/ACT AEPB Inhale 1 puff into the lungs daily. 180 each 3   Guaifenesin (MUCINEX MAXIMUM STRENGTH) 1200 MG TB12 Take 1,200 mg by mouth 2 (two) times daily.     Magnesium 500 MG TABS Take 500 mg by mouth daily with lunch.     Multiple Vitamins-Minerals (IMMUNE SUPPORT PO) Take 2 tablets by mouth daily.     Respiratory Therapy Supplies (FLUTTER) DEVI Use as directed 1 each 0   rosuvastatin (CRESTOR) 10 MG tablet Take 1 tablet (10 mg total) by mouth daily. 90 tablet 3   sodium chloride (OCEAN) 0.65 % SOLN nasal spray Place 1 spray into both nostrils daily as needed for congestion.     temazepam (RESTORIL) 15 MG capsule Take 1 capsule (15 mg total) by mouth at bedtime as needed. (Patient taking differently: Take 15 mg by mouth at bedtime.) 30 capsule 2   vitamin B-12 (CYANOCOBALAMIN) 1000 MCG tablet Take 1,000 mcg by mouth daily.     Facility-Administered Medications Prior to Visit  Medication Dose Route Frequency Provider Last Rate Last Admin   Sonafine emulsion 1 application  1 application Topical Once Gery Pray, MD        Review of Systems  Constitutional:  Negative for chills, fever, malaise/fatigue and weight loss.  HENT:  Negative for hearing loss, sore throat and tinnitus.   Eyes:  Negative  for blurred vision and double vision.  Respiratory:  Positive for cough and shortness of breath. Negative for hemoptysis, sputum production, wheezing and stridor.   Cardiovascular:  Negative for chest pain, palpitations, orthopnea, leg swelling and PND.  Gastrointestinal:  Negative for abdominal pain, constipation, diarrhea, heartburn, nausea and vomiting.  Genitourinary:  Negative for dysuria, hematuria and urgency.  Musculoskeletal:  Negative for joint pain and myalgias.  Skin:  Negative for itching and rash.  Neurological:  Negative for dizziness, tingling, weakness and headaches.  Endo/Heme/Allergies:  Negative for environmental allergies. Does not bruise/bleed easily.  Psychiatric/Behavioral:  Negative for depression. The patient is not nervous/anxious and does not have insomnia.   All other systems reviewed and are negative.   Objective:  Physical Exam Vitals reviewed.  Constitutional:      General: She is not in acute distress.    Appearance: She is well-developed.  HENT:     Head: Normocephalic and atraumatic.  Eyes:     General: No scleral icterus.    Conjunctiva/sclera: Conjunctivae normal.     Pupils: Pupils are equal, round, and reactive to light.  Neck:     Vascular: No JVD.     Trachea: No tracheal deviation.  Cardiovascular:     Rate and Rhythm: Normal rate and regular rhythm.     Heart sounds: Normal heart sounds. No murmur heard. Pulmonary:     Effort: Pulmonary effort is normal. No tachypnea, accessory muscle usage or respiratory distress.     Breath sounds: No stridor. No wheezing, rhonchi or rales.  Abdominal:     General: There is no distension.     Palpations: Abdomen is soft.     Tenderness: There is no abdominal tenderness.  Musculoskeletal:  General: No tenderness.     Cervical back: Neck supple.  Lymphadenopathy:     Cervical: No cervical adenopathy.  Skin:    General: Skin is warm and dry.     Capillary Refill: Capillary refill takes less  than 2 seconds.     Findings: No rash.  Neurological:     Mental Status: She is alert and oriented to person, place, and time.  Psychiatric:        Behavior: Behavior normal.     Vitals:   04/23/21 1335  BP: 130/74  Pulse: 93  Temp: 98.2 F (36.8 C)  TempSrc: Oral  SpO2: 95%  Weight: 131 lb 9.6 oz (59.7 kg)  Height: _0  (1.626 m)    95% on RA BMI Readings from Last 3 Encounters:  04/23/21 22.59 kg/m  04/09/21 22.43 kg/m  03/17/21 22.80 kg/m   Wt Readings from Last 3 Encounters:  04/23/21 131 lb 9.6 oz (59.7 kg)  04/09/21 130 lb 11.2 oz (59.3 kg)  03/17/21 132 lb 12.8 oz (60.2 kg)     CBC    Component Value Date/Time   WBC 5.6 04/09/2021 1308   WBC 5.2 03/14/2021 1412   RBC 4.24 04/09/2021 1308   HGB 11.9 (L) 04/09/2021 1308   HGB 13.7 09/22/2020 1325   HCT 35.6 (L) 04/09/2021 1308   HCT 41.2 09/22/2020 1325   PLT 145 (L) 04/09/2021 1308   PLT 143 (L) 09/22/2020 1325   MCV 84.0 04/09/2021 1308   MCV 89 09/22/2020 1325   MCH 28.1 04/09/2021 1308   MCHC 33.4 04/09/2021 1308   RDW 13.4 04/09/2021 1308   RDW 12.9 09/22/2020 1325   LYMPHSABS 1.0 04/09/2021 1308   LYMPHSABS 2.1 09/22/2020 1325   MONOABS 0.4 04/09/2021 1308   EOSABS 0.1 04/09/2021 1308   EOSABS 0.1 09/22/2020 1325   BASOSABS 0.0 04/09/2021 1308   BASOSABS 0.0 09/22/2020 1325    Chest Imaging:  May 2020 CT chest: Scattered nodules.  Lower lobe small areas of bronchiectasis. The patient's images have been independently reviewed by me.   PET scan 09/29/2020: SUV of 11, subcarinal adenopathy concerning for malignancy. The patient's images have been independently reviewed by me.    Chest x-ray 02/09/2021 CT chest 02/19/2021: Right upper lobe collapse.  Which is new from previous chest x-ray imaging.  CT chest 03/14/2021: Resolution of the right upper lobe collapse, mucous and tracheal debris. The patient's images have been independently reviewed by me.    Pulmonary Functions  Testing Results: No flowsheet data found.  FeNO: none   Pathology: none   Echocardiogram: none  Heart Catheterization: none     Assessment & Plan:     ICD-10-CM   1. NSCLC of right lung ([place])  C34.91     2. Low TSH level  R79.89 Thyroid Cascade Profile    CANCELED: Thyroid Cascade Profile    3. Stage 2 moderate COPD by GOLD classification ([place])  J44.9     4. 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 Lung collapse  J98.19     6. Lymphadenopathy, mediastinal  R59.0     7. Shortness of breath  R06.02     8. Bronchiectasis without complication ([place])  U23.5        Discussion:  This is a 66 year old female, moderate COPD FEV1 of 66%, longstanding history of smoking was enrolled in a lung cancer screening program found to have an enlarged subcarinal node taken for bronchoscopy and diagnosed with  non-small cell lung cancer treated with chemotherapy and radiation.  She found to have right middle lobe collapse taken for bronchoscopy found to have irregular mucosa and no significant endobronchial disease after treatments.  She had a right upper lobe collapse likely due to mucous plugging with repeat CT that showed resolution.  She also has bronchiectasis and has a daily airway clearance routine.  Also has been seen by infectious disease in the past.  At this point restarted on immune therapy and tolerating it well. ° °Plan: °No need for bronchoscopy at this time. °Her right upper lobe is reinflated. °Continue to monitor symptoms and continue airway clearance techniques for the maintenance of her bronchiectasis and MAI. °Continue follow-up with infectious disease °Continue follow-up with medical oncology for immune therapy treatment. °Continue Trelegy for COPD management. °She has a dysphagia evaluation by GI and a EGD planned next week. ° °Return to clinic to see us in 3 months or as needed. ° ° ° °Current Outpatient Medications:  °  acetaminophen (TYLENOL) 500 MG tablet, Take 500 mg  by mouth every 6 (six) hours as needed for moderate pain or headache., Disp: , Rfl:  °  albuterol (PROVENTIL) (2.5 MG/3ML) 0.083% nebulizer solution, Take 3 mLs (2.5 mg total) by nebulization every 6 (six) hours as needed for wheezing or shortness of breath. (Patient taking differently: Take 2.5 mg by nebulization 3 (three) times daily.), Disp: 540 mL, Rfl: 3 °  albuterol (VENTOLIN HFA) 108 (90 Base) MCG/ACT inhaler, Inhale 2 puffs into the lungs every 6 (six) hours as needed for wheezing or shortness of breath., Disp: , Rfl:  °  aspirin EC 81 MG tablet, Take 81 mg by mouth daily with lunch. , Disp: , Rfl:  °  buPROPion (WELLBUTRIN XL) 300 MG 24 hr tablet, Take 1 tablet (300 mg total) by mouth daily., Disp: 90 tablet, Rfl: 0 °  Cholecalciferol (DIALYVITE VITAMIN D 5000) 125 MCG (5000 UT) capsule, Take 5,000 Units by mouth daily., Disp: , Rfl:  °  esomeprazole (NEXIUM) 40 MG capsule, Take 1 capsule (40 mg total) by mouth daily., Disp: 90 capsule, Rfl: 3 °  ezetimibe (ZETIA) 10 MG tablet, Take 1 tablet (10 mg total) by mouth daily., Disp: 90 tablet, Rfl: 1 °  Fluticasone-Umeclidin-Vilant (TRELEGY ELLIPTA) 100-62.5-25 MCG/ACT AEPB, Inhale 1 puff into the lungs daily., Disp: 180 each, Rfl: 3 °  Guaifenesin (MUCINEX MAXIMUM STRENGTH) 1200 MG TB12, Take 1,200 mg by mouth 2 (two) times daily., Disp: , Rfl:  °  Magnesium 500 MG TABS, Take 500 mg by mouth daily with lunch., Disp: , Rfl:  °  Multiple Vitamins-Minerals (IMMUNE SUPPORT PO), Take 2 tablets by mouth daily., Disp: , Rfl:  °  Respiratory Therapy Supplies (FLUTTER) DEVI, Use as directed, Disp: 1 each, Rfl: 0 °  rosuvastatin (CRESTOR) 10 MG tablet, Take 1 tablet (10 mg total) by mouth daily., Disp: 90 tablet, Rfl: 3 °  sodium chloride (OCEAN) 0.65 % SOLN nasal spray, Place 1 spray into both nostrils daily as needed for congestion., Disp: , Rfl:  °  temazepam (RESTORIL) 15 MG capsule, Take 1 capsule (15 mg total) by mouth at bedtime as needed. (Patient taking  differently: Take 15 mg by mouth at bedtime.), Disp: 30 capsule, Rfl: 2 °  vitamin B-12 (CYANOCOBALAMIN) 1000 MCG tablet, Take 1,000 mcg by mouth daily., Disp: , Rfl:  °No current facility-administered medications for this visit. ° °Facility-Administered Medications Ordered in Other Visits:  °  Sonafine emulsion 1 application, 1 application, Topical,   Once, Gery Pray, MD    Garner Nash, DO [facility] 04/23/2021 1:50 PM

## 2021-04-24 LAB — THYROXINE (T4) FREE, DIRECT, S: T4,Free (Direct): 0.8 ng/dL — ABNORMAL LOW (ref 0.82–1.77)

## 2021-04-24 LAB — THYROID CASCADE PROFILE: TSH: 10.9 u[IU]/mL — ABNORMAL HIGH (ref 0.450–4.500)

## 2021-04-26 ENCOUNTER — Other Ambulatory Visit: Payer: Self-pay | Admitting: Physician Assistant

## 2021-04-26 ENCOUNTER — Telehealth: Payer: Self-pay | Admitting: Internal Medicine

## 2021-04-26 ENCOUNTER — Telehealth: Payer: Self-pay | Admitting: Physician Assistant

## 2021-04-26 ENCOUNTER — Telehealth: Payer: Self-pay | Admitting: Medical Oncology

## 2021-04-26 DIAGNOSIS — E039 Hypothyroidism, unspecified: Secondary | ICD-10-CM

## 2021-04-26 MED ORDER — LEVOTHYROXINE SODIUM 50 MCG PO TABS: 50.0000 ug | ORAL_TABLET | Freq: Every day | ORAL | 1 refills | Status: DC

## 2021-04-26 NOTE — Telephone Encounter (Signed)
Fax received from Access nurse on call.   Katrina Campbell is worried about her TSH results from Friday. Does she need to be worried?  I told Rachana that Rubin Payor is sending in a prescription for Synthroid to her pharmacy.

## 2021-04-26 NOTE — Telephone Encounter (Signed)
I called the patient to review her TSH. On 04/09/21, her TSH is low. Discussed that immunotherapy can cause thyroid dysfunction. Discussed often we see low TSH at first. Discussed that we do not treat the low TSH as it is common for the TSH to become elevated shortly afterwards, which is what occurred. She had repeat thyroid labs drawn at Dr. Fabio Bering office which showed her now having hypothyroidism. Discussed this is common and we start treating once she has hypothyroidism. I have sent her a prescription for synthroid to her pharmacy. We will continue to check her TSH every 4 weeks. She expressed understanding.

## 2021-04-26 NOTE — Telephone Encounter (Signed)
Patient was scheduled endoscopic procedure later this week with Dr. Ardis Hughs She was here in the [facility] today with her husband who had a procedure with me  She had some thyroid function testing done last week which she states were not "normal". She wanted to clear this with Dr. Ardis Hughs to be sure he is okay proceeding with her scheduled procedure  I am forwarding this note to him

## 2021-04-26 NOTE — Telephone Encounter (Signed)
The pt has been advised and will keep her appt as planned.

## 2021-04-27 NOTE — Progress Notes (Signed)
Triage,   Please tell patient to follow up with PCP regarding thyroid studies.   CC: Katrina Bode, PA   Thanks,  BLI  Garner Nash, DO [facility] 04/27/2021 11:11 AM

## 2021-04-28 NOTE — Anesthesia Preprocedure Evaluation (Addendum)
Anesthesia Evaluation  Patient identified by MRN, date of birth, ID band Patient awake    Reviewed: Allergy & Precautions, NPO status , Patient's Chart, lab work & pertinent test results  Airway Mallampati: II  TM Distance: >3 FB Neck ROM: Full    Dental no notable dental hx. (+) Teeth Intact, Dental Advisory Given   Pulmonary COPD,  COPD inhaler, former smoker,   moderate COPD, former smoker quit 2014, MAI, non-small cell lung cancer treated with chemotherapy and radiation   Pulmonary exam normal breath sounds clear to auscultation       Cardiovascular hypertension, + CAD and + Past MI  Normal cardiovascular exam Rhythm:Regular Rate:Normal  TTE 2022 1. Left ventricular ejection fraction, by estimation, is 55 to 60%. Left  ventricular ejection fraction by 3D volume is 62 %. The left ventricle has  normal function. The left ventricle has no regional wall motion  abnormalities. Left ventricular diastolic  parameters are indeterminate. The average left ventricular global  longitudinal strain is -17.7 %. The global longitudinal strain is normal.  2. Right ventricular systolic function is normal. The right ventricular  size is normal. Tricuspid regurgitation signal is inadequate for assessing  PA pressure.  3. Left atrial size was mildly dilated.  4. The mitral valve is normal in structure. Trivial mitral valve  regurgitation. No evidence of mitral stenosis.  5. The aortic valve is tricuspid. Aortic valve regurgitation is trivial.  No aortic stenosis is present.  6. Aortic dilatation noted. There is mild dilatation of the ascending  aorta, measuring 40 mm.  7. The inferior vena cava is normal in size with greater than 50%  respiratory variability, suggesting right atrial pressure of 3 mmHg.  Cath 2019 Conclusions: 1. Eccentric calcification with mild stenosis of the ostial LAD (~20%).  Otherwise, no angiographically  significant coronary artery disease. 2. Normal left ventricular systolic function and filling pressure.    Neuro/Psych PSYCHIATRIC DISORDERS Anxiety Depression negative neurological ROS     GI/Hepatic Neg liver ROS, GERD  ,  Endo/Other  Hypothyroidism   Renal/GU negative Renal ROS  negative genitourinary   Musculoskeletal negative musculoskeletal ROS (+)   Abdominal   Peds  Hematology negative hematology ROS (+)   Anesthesia Other Findings   Reproductive/Obstetrics                            Anesthesia Physical Anesthesia Plan  ASA: 3  Anesthesia Plan: MAC   Post-op Pain Management:    Induction: Intravenous  PONV Risk Score and Plan: Propofol infusion and Treatment may vary due to age or medical condition  Airway Management Planned: Natural Airway  Additional Equipment:   Intra-op Plan:   Post-operative Plan:   Informed Consent: I have reviewed the patients History and Physical, chart, labs and discussed the procedure including the risks, benefits and alternatives for the proposed anesthesia with the patient or authorized representative who has indicated his/her understanding and acceptance.     Dental advisory given  Plan Discussed with: CRNA  Anesthesia Plan Comments:         Anesthesia Quick Evaluation

## 2021-04-29 ENCOUNTER — Ambulatory Visit (HOSPITAL_COMMUNITY)
Admission: RE | Admit: 2021-04-29 | Discharge: 2021-04-29 | Disposition: A | Discharge status: Home / Self Care | Payer: Medicare Other | Attending: Gastroenterology | Admitting: Gastroenterology

## 2021-04-29 ENCOUNTER — Encounter (HOSPITAL_COMMUNITY)
Admission: RE | Disposition: A | Discharge status: Home / Self Care | Payer: Self-pay | Source: Home / Self Care | Attending: Gastroenterology

## 2021-04-29 ENCOUNTER — Ambulatory Visit (HOSPITAL_COMMUNITY): Payer: Medicare Other | Admitting: Anesthesiology

## 2021-04-29 ENCOUNTER — Encounter (HOSPITAL_COMMUNITY): Payer: Self-pay | Admitting: Gastroenterology

## 2021-04-29 ENCOUNTER — Other Ambulatory Visit: Payer: Self-pay

## 2021-04-29 DIAGNOSIS — R1310 Dysphagia, unspecified: Secondary | ICD-10-CM

## 2021-04-29 DIAGNOSIS — Z9221 Personal history of antineoplastic chemotherapy: Secondary | ICD-10-CM | POA: Insufficient documentation

## 2021-04-29 DIAGNOSIS — Z87891 Personal history of nicotine dependence: Secondary | ICD-10-CM | POA: Insufficient documentation

## 2021-04-29 DIAGNOSIS — R1314 Dysphagia, pharyngoesophageal phase: Secondary | ICD-10-CM

## 2021-04-29 DIAGNOSIS — K222 Esophageal obstruction: Secondary | ICD-10-CM | POA: Diagnosis not present

## 2021-04-29 DIAGNOSIS — I252 Old myocardial infarction: Secondary | ICD-10-CM | POA: Diagnosis not present

## 2021-04-29 DIAGNOSIS — F32A Depression, unspecified: Secondary | ICD-10-CM | POA: Insufficient documentation

## 2021-04-29 DIAGNOSIS — Z85118 Personal history of other malignant neoplasm of bronchus and lung: Secondary | ICD-10-CM | POA: Diagnosis not present

## 2021-04-29 DIAGNOSIS — I10 Essential (primary) hypertension: Secondary | ICD-10-CM | POA: Diagnosis not present

## 2021-04-29 DIAGNOSIS — K449 Diaphragmatic hernia without obstruction or gangrene: Secondary | ICD-10-CM | POA: Insufficient documentation

## 2021-04-29 DIAGNOSIS — K219 Gastro-esophageal reflux disease without esophagitis: Secondary | ICD-10-CM | POA: Insufficient documentation

## 2021-04-29 DIAGNOSIS — Z7951 Long term (current) use of inhaled steroids: Secondary | ICD-10-CM | POA: Insufficient documentation

## 2021-04-29 DIAGNOSIS — J449 Chronic obstructive pulmonary disease, unspecified: Secondary | ICD-10-CM | POA: Diagnosis not present

## 2021-04-29 DIAGNOSIS — I2510 Atherosclerotic heart disease of native coronary artery without angina pectoris: Secondary | ICD-10-CM | POA: Insufficient documentation

## 2021-04-29 HISTORY — PX: ESOPHAGOGASTRODUODENOSCOPY (EGD) WITH PROPOFOL: SHX5813

## 2021-04-29 HISTORY — PX: BALLOON DILATION: SHX5330

## 2021-04-29 LAB — MYCOBACTERIA,CULT W/FLUOROCHROME SMEAR
MICRO NUMBER:: 12748519
SMEAR:: NONE SEEN
SPECIMEN QUALITY:: ADEQUATE

## 2021-04-29 SURGERY — ESOPHAGOGASTRODUODENOSCOPY (EGD) WITH PROPOFOL
Anesthesia: Monitor Anesthesia Care

## 2021-04-29 MED ORDER — SODIUM CHLORIDE 0.9 % IV SOLN: INTRAVENOUS | Status: DC

## 2021-04-29 MED ORDER — ALBUTEROL SULFATE HFA 108 (90 BASE) MCG/ACT IN AERS
INHALATION_SPRAY | RESPIRATORY_TRACT | Status: DC | PRN
  Administered 2021-04-29 (×2): 2 via RESPIRATORY_TRACT

## 2021-04-29 MED ORDER — LACTATED RINGERS IV SOLN: INTRAVENOUS | Status: DC

## 2021-04-29 MED ORDER — LIDOCAINE 2% (20 MG/ML) 5 ML SYRINGE
INTRAMUSCULAR | Status: DC | PRN
  Administered 2021-04-29: 40 mg via INTRAVENOUS

## 2021-04-29 MED ORDER — PROPOFOL 10 MG/ML IV BOLUS
INTRAVENOUS | Status: DC | PRN
  Administered 2021-04-29 (×3): 40 mg via INTRAVENOUS
  Administered 2021-04-29: 20 mg via INTRAVENOUS

## 2021-04-29 SURGICAL SUPPLY — 15 items

## 2021-04-29 NOTE — Op Note (Signed)
[facility] Patient Name: Katrina Campbell Procedure Date: 04/29/2021 MRN: [id number] Attending MD: Milus Banister , MD Date of Birth: [birth year] CSN: [id number] Age: 66 Admit Type: Outpatient Procedure:                Upper GI endoscopy Indications:              Dysphagia, globus, barium esophagram 03/2021 showed                            no esophageal stricture, 2022 chest XRT for cancer Providers:                Milus Banister, MD, Carmie End, RN, Cletis [place], Technician, Glenis Smoker, CRNA Referring MD:              Medicines:                Monitored Anesthesia Care Complications:            No immediate complications. Estimated blood loss:                            None. Estimated Blood Loss:     Estimated blood loss: none. Procedure:                Pre-Anesthesia Assessment:                           - Prior to the procedure, a History and Physical                            was performed, and patient medications and                            allergies were reviewed. The patient's tolerance of                            previous anesthesia was also reviewed. The risks                            and benefits of the procedure and the sedation                            options and risks were discussed with the patient.                            All questions were answered, and informed consent                            was obtained. Prior Anticoagulants: The patient has                            taken no previous anticoagulant or antiplatelet  agents. ASA Grade Assessment: III - A patient with                            severe systemic disease. After reviewing the risks                            and benefits, the patient was deemed in                            satisfactory condition to undergo the procedure.                           After obtaining informed consent, the endoscope was                             passed under direct vision. Throughout the                            procedure, the patient's blood pressure, pulse, and                            oxygen saturations were monitored continuously. The                            [id number]-H190 ([id number]) Olympus endoscope was introduced                            through the mouth, and advanced to the second part                            of duodenum. The upper GI endoscopy was                            accomplished without difficulty. The patient                            tolerated the procedure well. Scope In: Scope Out: Findings:      One benign-appearing, intrinsic mild stenosis was found at the       gastroesophageal junction (thin, very minor Schatzki's ring). A TTS       dilator was passed through the scope. Dilation with an 18-19-20 mm       balloon dilator was performed to 20 mm. There was no mucusoal disruption       or bleeding following dilation.      Small hiatal hernia.      The exam was otherwise without abnormality. Impression:               - Thin, minor Schatzki's ring, dilated today to                            53mm.                           - Small hiatal hernia                           -  The examination was otherwise normal. Moderate Sedation:      Not Applicable - Patient had care per Anesthesia. Recommendation:           - Patient has a contact number available for                            emergencies. The signs and symptoms of potential                            delayed complications were discussed with the                            patient. Return to normal activities tomorrow.                            Written discharge instructions were provided to the                            patient.                           - Resume previous diet.                           - Continue present medications. Try to take the                            nexium 20-30 min before your breakfast meal daily.                            - Please call Dr. Ardis Hughs' office in 2-3 weeks to                            report on your response to this dilation. If still                            bothered by globus, dysphagia then will probably                            refer to ENT for evaluation. Procedure Code(s):        --- Professional ---                           [phone], Esophagogastroduodenoscopy, flexible,                            transoral; with transendoscopic balloon dilation of                            esophagus (less than 30 mm diameter) Diagnosis Code(s):        --- Professional ---                           K22.2, Esophageal obstruction  R13.10, Dysphagia, unspecified CPT copyright 2019 [employer]. All rights reserved. The codes documented in this report are preliminary and upon coder review may  be revised to meet current compliance requirements. Milus Banister, MD 04/29/2021 9:05:17 AM This report has been signed electronically. Number of Addenda: 0

## 2021-04-29 NOTE — Discharge Instructions (Signed)
YOU HAD AN ENDOSCOPIC PROCEDURE TODAY: Refer to the procedure report and other information in the discharge instructions given to you for any specific questions about what was found during the examination. If this information does not answer your questions, please call [place] office at [phone] to clarify.  ° °YOU SHOULD EXPECT: Some feelings of bloating in the abdomen. Passage of more gas than usual. Walking can help get rid of the air that was put into your GI tract during the procedure and reduce the bloating. If you had a lower endoscopy (such as a colonoscopy or flexible sigmoidoscopy) you may notice spotting of blood in your stool or on the toilet paper. Some abdominal soreness may be present for a day or two, also. ° °DIET: Your first meal following the procedure should be a light meal and then it is ok to progress to your normal diet. A half-sandwich or bowl of soup is an example of a good first meal. Heavy or fried foods are harder to digest and may make you feel nauseous or bloated. Drink plenty of fluids but you should avoid alcoholic beverages for 24 hours. If you had a esophageal dilation, please see attached instructions for diet.   ° °ACTIVITY: Your care partner should take you home directly after the procedure. You should plan to take it easy, moving slowly for the rest of the day. You can resume normal activity the day after the procedure however YOU SHOULD NOT DRIVE, use power tools, machinery or perform tasks that involve climbing or major physical exertion for 24 hours (because of the sedation medicines used during the test).  ° °SYMPTOMS TO REPORT IMMEDIATELY: °A gastroenterologist can be reached at any hour. Please call [phone]  for any of the following symptoms:  °Following lower endoscopy (colonoscopy, flexible sigmoidoscopy) °Excessive amounts of blood in the stool  °Significant tenderness, worsening of abdominal pains  °Swelling of the abdomen that is new, acute  °Fever of 100° or  higher  °Following upper endoscopy (EGD, EUS, ERCP, esophageal dilation) °Vomiting of blood or coffee ground material  °New, significant abdominal pain  °New, significant chest pain or pain under the shoulder blades  °Painful or persistently difficult swallowing  °New shortness of breath  °Black, tarry-looking or red, bloody stools ° °FOLLOW UP:  °If any biopsies were taken you will be contacted by phone or by letter within the next 1-3 weeks. Call [phone]  if you have not heard about the biopsies in 3 weeks.  °Please also call with any specific questions about appointments or follow up tests. ° °

## 2021-04-29 NOTE — Transfer of Care (Signed)
Immediate Anesthesia Transfer of Care Note  Patient: Katrina Campbell  Procedure(s) Performed: ESOPHAGOGASTRODUODENOSCOPY (EGD) WITH PROPOFOL BALLOON DILATION  Patient Location: PACU and Endoscopy Unit  Anesthesia Type:MAC  Level of Consciousness: awake and alert   Airway & Oxygen Therapy: Patient Spontanous Breathing and Patient connected to face mask oxygen  Post-op Assessment: Report given to RN and Post -op Vital signs reviewed and stable  Post vital signs: Reviewed and stable  Last Vitals:  Vitals Value Taken Time  BP    Temp    Pulse 89 04/29/21 0902  Resp 21 04/29/21 0902  SpO2 97 % 04/29/21 0902  Vitals shown include unvalidated device data.  Last Pain:  Vitals:   04/29/21 0738  TempSrc: Axillary  PainSc: 0-No pain         Complications: No notable events documented.

## 2021-04-29 NOTE — Anesthesia Procedure Notes (Signed)
Procedure Name: MAC Date/Time: 04/29/2021 8:35 AM Performed by: Cynda Familia, CRNA Pre-anesthesia Checklist: Patient identified, Emergency Drugs available, Suction available, Patient being monitored and Timeout performed Patient Re-evaluated:Patient Re-evaluated prior to induction Oxygen Delivery Method: Simple face mask Dental Injury: Teeth and Oropharynx as per pre-operative assessment

## 2021-04-29 NOTE — Anesthesia Postprocedure Evaluation (Signed)
Anesthesia Post Note  Patient: Naisha Wisdom  Procedure(s) Performed: ESOPHAGOGASTRODUODENOSCOPY (EGD) WITH PROPOFOL BALLOON DILATION     Patient location during evaluation: Endoscopy Anesthesia Type: MAC Level of consciousness: awake and alert Pain management: pain level controlled Vital Signs Assessment: post-procedure vital signs reviewed and stable Respiratory status: spontaneous breathing, nonlabored ventilation, respiratory function stable and patient connected to nasal cannula oxygen Cardiovascular status: blood pressure returned to baseline and stable Postop Assessment: no apparent nausea or vomiting Anesthetic complications: no   No notable events documented.  Last Vitals:  Vitals:   04/29/21 0910 04/29/21 0920  BP: (!) 135/91 (!) 146/78  Pulse: 86 71  Resp: 18 18  Temp:    SpO2: 95% 93%    Last Pain:  Vitals:   04/29/21 0920  TempSrc:   PainSc: 0-No pain                 Florina Glas L Shamya Macfadden

## 2021-04-29 NOTE — Interval H&P Note (Signed)
History and Physical Interval Note:  04/29/2021 7:41 AM  Katrina Campbell  has presented today for surgery, with the diagnosis of gerd, dysphagia.  The various methods of treatment have been discussed with the patient and family. After consideration of risks, benefits and other options for treatment, the patient has consented to  Procedure(s): ESOPHAGOGASTRODUODENOSCOPY (EGD) WITH PROPOFOL (N/A) BALLOON DILATION (N/A) as a surgical intervention.  The patient's history has been reviewed, patient examined, no change in status, stable for surgery.  I have reviewed the patient's chart and labs.  Questions were answered to the patient's satisfaction.     Milus Banister

## 2021-04-29 NOTE — Progress Notes (Signed)
Left pt a vm to call the office and schedule a follow up appt.

## 2021-04-30 ENCOUNTER — Encounter (HOSPITAL_COMMUNITY): Payer: Self-pay | Admitting: Gastroenterology

## 2021-05-04 ENCOUNTER — Telehealth: Payer: Self-pay

## 2021-05-04 ENCOUNTER — Telehealth: Payer: Self-pay | Admitting: Pulmonary Disease

## 2021-05-04 NOTE — Telephone Encounter (Signed)
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 Message routed Dr.Manandhar.      [place], CMA

## 2021-05-04 NOTE — Telephone Encounter (Signed)
Garner Nash, DO  04/27/2021 11:12 AM EST     Triage,    Please tell patient to follow up with PCP regarding thyroid studies.    CC: Chana Bode, PA    Thanks,   BLI   Garner Nash, DO [facility] 04/27/2021 11:11 AM     Spoke to patient, who stated that she has contacted PCP regarding thyroid studies.  Nothing further needed at this time.

## 2021-05-05 ENCOUNTER — Encounter: Payer: Self-pay | Admitting: Cardiology

## 2021-05-05 NOTE — Telephone Encounter (Signed)
Santiago Glad with [facility] notified identify the organism as well run susceptibility on the isolated organism

## 2021-05-06 ENCOUNTER — Inpatient Hospital Stay: Payer: Medicare Other | Attending: Physician Assistant

## 2021-05-06 ENCOUNTER — Inpatient Hospital Stay: Payer: Medicare Other

## 2021-05-06 ENCOUNTER — Inpatient Hospital Stay (HOSPITAL_BASED_OUTPATIENT_CLINIC_OR_DEPARTMENT_OTHER): Payer: Medicare Other | Admitting: Internal Medicine

## 2021-05-06 ENCOUNTER — Other Ambulatory Visit: Payer: Self-pay

## 2021-05-06 VITALS — BP 154/86 | HR 78 | Temp 98.7°F | Resp 19 | Ht 64.0 in | Wt 132.6 lb

## 2021-05-06 DIAGNOSIS — Z5112 Encounter for antineoplastic immunotherapy: Secondary | ICD-10-CM

## 2021-05-06 DIAGNOSIS — C3491 Malignant neoplasm of unspecified part of right bronchus or lung: Secondary | ICD-10-CM

## 2021-05-06 DIAGNOSIS — Z79899 Other long term (current) drug therapy: Secondary | ICD-10-CM | POA: Insufficient documentation

## 2021-05-06 DIAGNOSIS — R5382 Chronic fatigue, unspecified: Secondary | ICD-10-CM

## 2021-05-06 LAB — CMP (CANCER CENTER ONLY)
ALT: 29 U/L (ref 0–44)
AST: 29 U/L (ref 15–41)
Albumin: 4.2 g/dL (ref 3.5–5.0)
Alkaline Phosphatase: 103 U/L (ref 38–126)
Anion gap: 7 (ref 5–15)
BUN: 15 mg/dL (ref 8–23)
CO2: 27 mmol/L (ref 22–32)
Calcium: 9.3 mg/dL (ref 8.9–10.3)
Chloride: 103 mmol/L (ref 98–111)
Creatinine: 0.77 mg/dL (ref 0.44–1.00)
GFR, Estimated: >60 mL/min (ref >60)
Glucose, Bld: 83 mg/dL (ref 70–99)
Potassium: 4 mmol/L (ref 3.5–5.1)
Sodium: 137 mmol/L (ref 135–145)
Total Bilirubin: 0.3 mg/dL (ref 0.3–1.2)
Total Protein: 7.2 g/dL (ref 6.5–8.1)

## 2021-05-06 LAB — CBC WITH DIFFERENTIAL (CANCER CENTER ONLY)
Abs Immature Granulocytes: 0.02 10*3/uL (ref 0.00–0.07)
Basophils Absolute: 0 10*3/uL (ref 0.0–0.1)
Basophils Relative: 0 %
Eosinophils Absolute: 0.1 10*3/uL (ref 0.0–0.5)
Eosinophils Relative: 1 %
HCT: 37 % (ref 36.0–46.0)
Hemoglobin: 12 g/dL (ref 12.0–15.0)
Immature Granulocytes: 0 %
Lymphocytes Relative: 17 %
Lymphs Abs: 1.2 10*3/uL (ref 0.7–4.0)
MCH: 27.3 pg (ref 26.0–34.0)
MCHC: 32.4 g/dL (ref 30.0–36.0)
MCV: 84.1 fL (ref 80.0–100.0)
Monocytes Absolute: 0.4 10*3/uL (ref 0.1–1.0)
Monocytes Relative: 5 %
Neutro Abs: 5.6 10*3/uL (ref 1.7–7.7)
Neutrophils Relative %: 77 %
Platelet Count: 160 10*3/uL (ref 150–400)
RBC: 4.4 MIL/uL (ref 3.87–5.11)
RDW: 14.3 % (ref 11.5–15.5)
WBC Count: 7.3 10*3/uL (ref 4.0–10.5)
nRBC: 0 % (ref 0.0–0.2)

## 2021-05-06 LAB — TSH: TSH: 27.827 u[IU]/mL — ABNORMAL HIGH (ref 0.308–3.960)

## 2021-05-06 MED ORDER — SODIUM CHLORIDE 0.9 % IV SOLN: Freq: Once | INTRAVENOUS | Status: AC

## 2021-05-06 MED ORDER — SODIUM CHLORIDE 0.9 % IV SOLN
1500.0000 mg | Freq: Once | INTRAVENOUS | Status: AC
  Administered 2021-05-06: 1500 mg via INTRAVENOUS
  Filled 2021-05-06: qty 30

## 2021-05-06 NOTE — Progress Notes (Signed)
[place] [phone]   [phone]  OFFICE PROGRESS NOTE  Carlena Hurl, PA-C [address] [place] [address]  DIAGNOSIS: Stage IIIA (TX, N2, M0) non-small cell lung cancer, squamous cell carcinoma presented with subcarinal mass/lymphadenopathy diagnosed in July 2022.   PRIOR THERAPY: Weekly carboplatin for AUC of 2 and paclitaxel 45 mg/m2.  First dose expected on 11/02/2020.  Status post 7 cycles.  Last dose was given 12/14/2020   CURRENT THERAPY: Consolidation treatment with immunotherapy with Imfinzi 1500 Mg IV every 4 weeks.  First dose 01/14/2021.  Status post 4 cycles  INTERVAL HISTORY: Katrina Campbell 66 y.o. female returns to the clinic today for follow-up visit.  The patient is feeling fine today with no concerning complaints.  She denied having any current chest pain, shortness of breath, cough or hemoptysis.  She denied having any nausea, vomiting, diarrhea or constipation.  She has no headache or visual changes.  She denied having any significant weight loss or night sweats.  She has been tolerating her treatment with Imfinzi fairly well X For the recent hyperthyroidism and currently being managed by her primary care physician.  She is here today for evaluation before starting cycle #5 of her treatment.  MEDICAL HISTORY: Past Medical History:  Diagnosis Date   Anxiety    COPD (chronic obstructive pulmonary disease) ([place])    Coronary artery disease    Dental staining 07/15/2019   Depression    History of radiation therapy    Right lung- 11/03/20-12/15/20- Dr. 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  MEDICATIONS:  Current Outpatient Medications  Medication Sig  Dispense Refill   acetaminophen (TYLENOL) 500 MG tablet Take 500 mg by mouth every 6 (six) hours as needed for moderate pain or headache.     albuterol (PROVENTIL) (2.5 MG/3ML) 0.083% nebulizer solution Take 3 mLs (2.5 mg total) by nebulization every 6 (six) hours as needed for wheezing or shortness of breath. (Patient taking differently: Take 2.5 mg by nebulization 3 (three) times daily.) 540 mL 3   albuterol (VENTOLIN HFA) 108 (90 Base) MCG/ACT inhaler Inhale 2 puffs into the lungs every 6 (six) hours as needed for wheezing or shortness of breath.     aspirin EC 81 MG tablet Take 81 mg by mouth daily with lunch.      buPROPion (WELLBUTRIN XL) 300 MG 24 hr tablet Take 1 tablet (300 mg total) by mouth daily. 90 tablet 0   Cholecalciferol (DIALYVITE VITAMIN D 5000) 125 MCG (5000 UT) capsule Take 5,000 Units by mouth daily.     esomeprazole (NEXIUM) 40 MG capsule Take 1 capsule (40 mg total) by mouth daily. 90 capsule 3   ezetimibe (ZETIA) 10 MG tablet Take 1 tablet (10 mg total) by mouth daily. 90 tablet 1   Fluticasone-Umeclidin-Vilant (TRELEGY ELLIPTA) 100-62.5-25 MCG/ACT AEPB Inhale 1 puff into the lungs daily. 180 each 3   Guaifenesin (MUCINEX MAXIMUM STRENGTH) 1200 MG TB12 Take 1,200 mg by mouth 2 (two) times daily.     levothyroxine (SYNTHROID) 50 MCG tablet TAKE 1 TABLET(50 MCG) BY MOUTH DAILY BEFORE BREAKFAST 90 tablet 0   Magnesium 500 MG TABS Take 500 mg by mouth daily with lunch.     Multiple Vitamins-Minerals (IMMUNE SUPPORT PO)  Take 2 tablets by mouth daily.     Respiratory Therapy Supplies (FLUTTER) DEVI Use as directed 1 each 0   rosuvastatin (CRESTOR) 10 MG tablet Take 1 tablet (10 mg total) by mouth daily. 90 tablet 3   sodium chloride (OCEAN) 0.65 % SOLN nasal spray Place 1 spray into both nostrils daily as needed for congestion.     temazepam (RESTORIL) 15 MG capsule Take 1 capsule (15 mg total) by mouth at bedtime as needed. (Patient taking differently: Take 15 mg by mouth at  bedtime.) 30 capsule 2   UNABLE TO FIND Med Name: immunotherapy infusion once per month     vitamin B-12 (CYANOCOBALAMIN) 1000 MCG tablet Take 1,000 mcg by mouth daily.     No current facility-administered medications for this visit.   Facility-Administered Medications Ordered in Other Visits  Medication Dose Route Frequency Provider Last Rate Last Admin   Sonafine emulsion 1 application  1 application Topical Once Gery Pray, MD        SURGICAL HISTORY:  Past Surgical History:  Procedure Laterality Date   BALLOON DILATION N/A 04/29/2021   Procedure: BALLOON DILATION;  Surgeon: Milus Banister, MD;  Location: [facility];  Service: Endoscopy;  Laterality: N/A;   BIOPSY  01/14/2021   Procedure: BIOPSY;  Surgeon: Garner Nash, DO;  Location: [place] END[facility]COPY;  Service: Pulmonary;;   BRONCHIAL BIOPSY  10/08/2020   Procedure: BRONCHIAL BIOPSIES;  Surgeon: Garner Nash, DO;  Location: [facility];  Service: Pulmonary;;   BRONCHIAL BRUSHINGS  10/08/2020   Procedure: BRONCHIAL BRUSHINGS;  Surgeon: Garner Nash, DO;  Location: [place] [facility];  Service: Pulmonary;;   BRONCHIAL BRUSHINGS  01/14/2021   Procedure: BRONCHIAL BRUSHINGS;  Surgeon: Garner Nash, DO;  Location: [place] ENDOSCOPY;  Service: Pulmonary;;   BRONCHIAL WASHINGS  10/08/2020   Procedure: BRONCHIAL WASHINGS;  Surgeon: Garner Nash, DO;  Location: [place] ENDOSCOPY;  Service: Pulmonary;;   BRONCHIAL WASHINGS  01/14/2021   Procedure: BRONCHIAL WASHINGS;  Surgeon: Garner Nash, DO;  Location: [place] ENDOSCOPY;  Service: Pulmonary;;   ESOPHAGOGASTRODUODENOSCOPY (EGD) WITH PROPOFOL N/A 04/29/2021   Procedure: ESOPHAGOGASTRODUODENOSCOPY (EGD) WITH PROPOFOL;  Surgeon: Milus Banister, MD;  Location: [facility];  Service: Endoscopy;  Laterality: N/A;   FINE NEEDLE ASPIRATION  10/08/2020   Procedure: FINE NEEDLE ASPIRATION (FNA) LINEAR;  Surgeon: Garner Nash, DO;  Location: [place] ENDOSCOPY;  Service: Pulmonary;;   LEFT HEART CATH  AND CORONARY ANGIOGRAPHY N/A 11/07/2017   Procedure: LEFT HEART CATH AND CORONARY ANGIOGRAPHY;  Surgeon: Nelva Bush, MD;  Location: [place] [facility] LAB;  Service: Cardiovascular;  Laterality: N/A;   NASAL SINUS SURGERY  1986   VIDEO BRONCHOSCOPY Bilateral 01/29/2019   Procedure: VIDEO BRONCHOSCOPY WITH FLUORO;  Surgeon: Marshell Garfinkel, MD;  Location: [place];  Service: Cardiopulmonary;  Laterality: Bilateral;   VIDEO BRONCHOSCOPY Right 01/14/2021   Procedure: VIDEO BRONCHOSCOPY WITHOUT FLUORO;  Surgeon: Garner Nash, DO;  Location: [facility];  Service: Pulmonary;  Laterality: Right;  possible cryotherapy   VIDEO BRONCHOSCOPY WITH ENDOBRONCHIAL ULTRASOUND N/A 10/08/2020   Procedure: VIDEO BRONCHOSCOPY WITH ENDOBRONCHIAL ULTRASOUND;  Surgeon: Garner Nash, DO;  Location: [place];  Service: Pulmonary;  Laterality: N/A;    REVIEW OF SYSTEMS:  A comprehensive review of systems was negative.   PHYSICAL EXAMINATION: General appearance: alert, cooperative, and no distress Head: Normocephalic, without obvious abnormality, atraumatic Neck: no adenopathy, no JVD, supple, symmetrical, trachea midline, and thyroid not enlarged, symmetric, no tenderness/mass/nodules Lymph nodes: Cervical, supraclavicular,  and axillary nodes normal. Resp: clear to auscultation bilaterally Back: symmetric, no curvature. ROM normal. No CVA tenderness. Cardio: regular rate and rhythm, S1, S2 normal, no murmur, click, rub or gallop GI: soft, non-tender; bowel sounds normal; no masses,  no organomegaly Extremities: extremities normal, atraumatic, no cyanosis or edema  ECOG PERFORMANCE STATUS: 1 - Symptomatic but completely ambulatory  Blood pressure (!) 154/86, pulse 78, temperature 98.7 F (37.1 C), temperature source Tympanic, resp. rate 19, height 5\' 4"  (1.626 m), weight 132 lb 9.6 oz (60.1 kg), SpO2 94 %.  LABORATORY DATA: Lab Results  Component Value Date   WBC 7.3 05/06/2021   HGB 12.0  05/06/2021   HCT 37.0 05/06/2021   MCV 84.1 05/06/2021   PLT 160 05/06/2021      Chemistry      Component Value Date/Time   NA 137 04/09/2021 1308   NA 144 02/10/2020 0813   K 3.6 04/09/2021 1308   CL 103 04/09/2021 1308   CO2 26 04/09/2021 1308   BUN 11 04/09/2021 1308   BUN 10 02/10/2020 0813   CREATININE 0.68 04/09/2021 1308   CREATININE 0.84 11/11/2019 1411      Component Value Date/Time   CALCIUM 9.2 04/09/2021 1308   ALKPHOS 89 04/09/2021 1308   AST 25 04/09/2021 1308   ALT 24 04/09/2021 1308   BILITOT 0.3 04/09/2021 1308       RADIOGRAPHIC STUDIES: No results found.  ASSESSMENT AND PLAN: This is a very pleasant 66 years old white female recently diagnosed with a stage IIIA (TX, N2, M0) non-small cell lung cancer, squamous cell carcinoma presented with subcarinal mass/lymphadenopathy diagnosed in July 2022. The patient underwent a course of concurrent chemoradiation with weekly carboplatin for AUC of 2 and paclitaxel 45 Mg/M2 status post 7 cycles. The patient tolerated her treatment well except for fatigue and sore throat. The patient is currently undergoing consolidation treatment with immunotherapy with Imfinzi 1500 Mg IV every 4 weeks status post 4 cycles. She has been tolerating this treatment well with no concerning adverse effects. I recommended for her to proceed with cycle #5 today as planned. I will see her back for follow-up visit in 4 weeks for evaluation before the next cycle of her treatment. For the hypothyroidism, she is followed by her primary care physician for management of this condition. The patient was advised to call immediately if she has any concerning symptoms in the interval. The patient voices understanding of current disease status and treatment options and is in agreement with the current care plan.  All questions were answered. The patient knows to call the clinic with any problems, questions or concerns. We can certainly see the patient  much sooner if necessary.  Disclaimer: This note was dictated with voice recognition software. Similar sounding words can inadvertently be transcribed and may not be corrected upon review.

## 2021-05-06 NOTE — Patient Instructions (Signed)
[facility] MEDICAL [facility]COLOG[facility]  Discharge Instructions: °Thank you for choosing [facility] to provide your oncology and hematology care.  ° °If you have a lab appointment with the Cancer Center, please go directly to the Cancer Center and check in at the registration area. °  °Wear comfortable clothing and clothing appropriate for easy access to any Portacath or PICC line.  ° °We strive to give you quality time with your provider. You may need to reschedule your appointment if you arrive late (15 or more minutes).  Arriving late affects you and other patients whose appointments are after yours.  Also, if you miss three or more appointments without notifying the office, you may be dismissed from the clinic at the provider’s discretion.    °  °For prescription refill requests, have your pharmacy contact our office and allow 72 hours for refills to be completed.   ° °Today you received the following chemotherapy and/or immunotherapy agents :  Durvalumab °    °  °To help prevent nausea and vomiting after your treatment, we encourage you to take your nausea medication as directed. ° °BELOW ARE SYMPTOMS THAT SHOULD BE REPORTED IMMEDIATELY: °*FEVER GREATER THAN 100.4 F (38 °C) OR HIGHER °*CHILLS OR SWEATING °*NAUSEA AND VOMITING THAT IS NOT CONTROLLED WITH YOUR NAUSEA MEDICATION °*UNUSUAL SHORTNESS OF BREATH °*UNUSUAL BRUISING OR BLEEDING °*URINARY PROBLEMS (pain or burning when urinating, or frequent urination) °*BOWEL PROBLEMS (unusual diarrhea, constipation, pain near the anus) °TENDERNESS IN MOUTH AND THROAT WITH OR WITHOUT PRESENCE OF ULCERS (sore throat, sores in mouth, or a toothache) °UNUSUAL RASH, SWELLING OR PAIN  °UNUSUAL VAGINAL DISCHARGE OR ITCHING  ° °Items with * indicate a potential emergency and should be followed up as soon as possible or go to the Emergency Department if any problems should occur. ° °Please show the CHEMOTHERAPY ALERT CARD or IMMUNOTHERAPY ALERT CARD at  check-in to the Emergency Department and triage nurse. ° °Should you have questions after your visit or need to cancel or reschedule your appointment, please contact [facility]  Dept: [phone]  and follow the prompts.  Office hours are 8:00 a.m. to 4:30 p.m. Monday - Friday. Please note that voicemails left after 4:00 p.m. may not be returned until the following business day.  We are closed weekends and major holidays. You have access to a nurse at all times for urgent questions. Please call the main number to the clinic Dept: [phone] and follow the prompts. ° ° °For any non-urgent questions, you may also contact your provider using MyChart. We now offer e-Visits for anyone 18 and older to request care online for non-urgent symptoms. For details visit [email or web address]. °  °Also download the MyChart app! Go to the app store, search "MyChart", open the app, select [facility], and log in with your MyChart username and password. ° °Due to Covid, a mask is required upon entering the hospital/clinic. If you do not have a mask, one will be given to you upon arrival. For doctor visits, patients may have 1 support person aged 18 or older with them. For treatment visits, patients cannot have anyone with them due to current Covid guidelines and our immunocompromised population.  ° °

## 2021-05-07 ENCOUNTER — Ambulatory Visit (INDEPENDENT_AMBULATORY_CARE_PROVIDER_SITE_OTHER): Payer: Medicare Other | Admitting: Medical

## 2021-05-07 ENCOUNTER — Telehealth: Payer: Self-pay | Admitting: Medical Oncology

## 2021-05-07 VITALS — BP 120/70 | HR 74 | Wt 132.6 lb

## 2021-05-07 DIAGNOSIS — F341 Dysthymic disorder: Secondary | ICD-10-CM | POA: Diagnosis not present

## 2021-05-07 DIAGNOSIS — C3491 Malignant neoplasm of unspecified part of right bronchus or lung: Secondary | ICD-10-CM | POA: Diagnosis not present

## 2021-05-07 DIAGNOSIS — E039 Hypothyroidism, unspecified: Secondary | ICD-10-CM

## 2021-05-07 DIAGNOSIS — F5104 Psychophysiologic insomnia: Secondary | ICD-10-CM

## 2021-05-07 DIAGNOSIS — I700 Atherosclerosis of aorta: Secondary | ICD-10-CM

## 2021-05-07 DIAGNOSIS — E785 Hyperlipidemia, unspecified: Secondary | ICD-10-CM

## 2021-05-07 DIAGNOSIS — J449 Chronic obstructive pulmonary disease, unspecified: Secondary | ICD-10-CM

## 2021-05-07 MED ORDER — TEMAZEPAM 15 MG PO CAPS: 15.0000 mg | ORAL_CAPSULE | Freq: Every evening | ORAL | 1 refills | Status: DC | PRN

## 2021-05-07 MED ORDER — LEVOTHYROXINE SODIUM 88 MCG PO TABS: 88.0000 ug | ORAL_TABLET | Freq: Every day | ORAL | 0 refills | Status: DC

## 2021-05-07 NOTE — Telephone Encounter (Signed)
Dr Glade Lloyd prescribed Synthroid 88 mcg for pts recent TSH results.

## 2021-05-07 NOTE — Telephone Encounter (Signed)
Concerned about elevated TSH @ 27.827. She has been taking Synthroid 50 mcg for 12 days.  She has appointment today with her PCP to discuss her result.

## 2021-05-07 NOTE — Progress Notes (Signed)
Subjective:  Katrina Campbell Campbell is a 66 y.o. female who presents for Chief Complaint  Patient presents with   follow-up on Thryoid    Had TSH done yesterday and number is over 27. Declines pneumonia shot     Here for follow-up on thyroid.  She is currently on levothyroxine 50 mcg started a few months ago for the first time.  She had blood work drawn recently that was abnormal.  Needs to have her dose adjusted.  Chronic insomnia-she needs refills on temazepam which works well for her  Hyperlipidemia-currently on Crestor 10 and Zetia.  Zetia was added in December by cardiology.  She did not tolerate Crestor 20 mg.  She was offered Repatha but declined to use that.  She continues with cancer therapy through oncology  Overall doing okay on Wellbutrin for mood  No other aggravating or relieving factors.    No other c/o.  Past Medical History:  Diagnosis Date   Anxiety    COPD (chronic obstructive pulmonary disease) ([place])    Coronary artery disease    Dental staining 07/15/2019   Depression    History of radiation therapy    Right lung- 11/03/20-12/15/20- Dr. 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     albuterol (PROVENTIL) (2.5 MG/3ML) 0.083% nebulizer solution Take 3 mLs (2.5 mg total) by nebulization every 6 (six) hours as needed for wheezing or shortness of breath. (Patient taking differently: Take 2.5 mg by nebulization 3 (three) times daily.) 540 mL 3   albuterol (VENTOLIN HFA) 108 (90 Base) MCG/ACT inhaler Inhale 2 puffs into the lungs every 6 (six) hours as needed for wheezing or shortness of breath.     aspirin EC 81 MG tablet Take 81 mg by mouth daily with  lunch.      buPROPion (WELLBUTRIN XL) 300 MG 24 hr tablet Take 1 tablet (300 mg total) by mouth daily. 90 tablet 0   Cholecalciferol (DIALYVITE VITAMIN D 5000) 125 MCG (5000 UT) capsule Take 5,000 Units by mouth daily.     esomeprazole (NEXIUM) 40 MG capsule Take 1 capsule (40 mg total) by mouth daily. 90 capsule 3   ezetimibe (ZETIA) 10 MG tablet Take 1 tablet (10 mg total) by mouth daily. 90 tablet 1   Fluticasone-Umeclidin-Vilant (TRELEGY ELLIPTA) 100-62.5-25 MCG/ACT AEPB Inhale 1 puff into the lungs daily. 180 each 3   Guaifenesin (MUCINEX MAXIMUM STRENGTH) 1200 MG TB12 Take 1,200 mg by mouth 2 (two) times daily.     Magnesium 500 MG TABS Take 500 mg by mouth daily with lunch.     Multiple Vitamins-Minerals (IMMUNE SUPPORT PO) Take 2 tablets by mouth daily.     Respiratory Therapy Supplies (FLUTTER) DEVI Use as directed 1 each 0   rosuvastatin (CRESTOR) 10 MG tablet Take 1 tablet (10 mg total) by mouth daily. 90 tablet 3   sodium chloride (OCEAN) 0.65 % SOLN nasal spray Place 1 spray into both nostrils daily as needed for congestion.     UNABLE TO FIND Med Name: immunotherapy infusion once per month     vitamin B-12 (CYANOCOBALAMIN) 1000 MCG tablet Take 1,000 mcg by mouth daily.  Current Facility-Administered Medications on File Prior to Visit  Medication Dose Route Frequency Provider Last Rate Last Admin   Sonafine emulsion 1 application  1 application Topical Once Gery Pray, MD         The following portions of the patient's history were reviewed and updated as appropriate: allergies, current medications, past family history, past medical history, past social history, past surgical history and problem list.  ROS Otherwise as in subjective above    Objective: BP 120/70    Pulse 74    Wt 132 lb 9.6 oz (60.1 kg)    BMI 22.76 kg/m   General appearance: alert, no distress, well developed, well nourished Neck: supple, no lymphadenopathy, no thyromegaly, no masses Pulses: 2+  radial pulses, 2+ pedal pulses, normal cap refill Ext: no edema    Assessment: Encounter Diagnoses  Name Primary?   Hypothyroidism, unspecified type Yes   Stage III squamous cell carcinoma of right lung (HCC)    Hyperlipidemia, unspecified hyperlipidemia type    Dysthymia    Stage 2 moderate COPD by GOLD classification ([place])    Chronic insomnia    Atherosclerosis of aorta (HCC)      Plan: Hypothyroidism - stop levothyroxine 50mcg.  Begin samples of Synthroid 66mcg daily 1 hour prior to breakfast.   Need updated labs in 62mo.   She is seeing cardiology in a month and may have labs done there including lipids.  Hyperlipidemia, atherosclerosis - she continues on Crestor 10mg  since December 2022, couldn't tolerate 20mg  Crestor.  Zetia added in December by cardiology.  She refused repatha.   She will be due for fasting lipids in a month, likely to be done at cardiology office  Dysthymia - she continues on Wellbutrin  Chronic insomnia - seems to find good relief with Temazepam, continue current therapy  Lung cancer - managed by oncology, reviewed recent notes  Katrina Campbell Campbell for follow-up on thryoid.  Diagnoses and all orders for this visit:  Hypothyroidism, unspecified type -     TSH + free T4; Future -     T3; Future  Stage III squamous cell carcinoma of right lung (HCC)  Hyperlipidemia, unspecified hyperlipidemia type -     Lipid panel; Future  Dysthymia  Stage 2 moderate COPD by GOLD classification ([place])  Chronic insomnia  Atherosclerosis of aorta (HCC)  Other orders -     levothyroxine (SYNTHROID) 88 MCG tablet; Take 1 tablet (88 mcg total) by mouth daily. -     temazepam (RESTORIL) 15 MG capsule; Take 1 capsule (15 mg total) by mouth at bedtime as needed.    Follow up: 78mo

## 2021-05-10 ENCOUNTER — Other Ambulatory Visit: Payer: Self-pay

## 2021-05-10 ENCOUNTER — Other Ambulatory Visit: Payer: Medicare Other

## 2021-05-10 DIAGNOSIS — A310 Pulmonary mycobacterial infection: Secondary | ICD-10-CM

## 2021-05-19 ENCOUNTER — Encounter: Payer: Self-pay | Admitting: Internal Medicine

## 2021-05-19 ENCOUNTER — Telehealth: Payer: Self-pay | Admitting: Medical

## 2021-05-19 MED ORDER — LEVOTHYROXINE SODIUM 88 MCG PO TABS: 88.0000 ug | ORAL_TABLET | Freq: Every day | ORAL | 2 refills | Status: DC

## 2021-05-19 NOTE — Telephone Encounter (Signed)
See if we have samples of 88 mcg Synthroid to give to her.  If not please send in 30+2 refills

## 2021-05-19 NOTE — Telephone Encounter (Signed)
done

## 2021-05-25 ENCOUNTER — Telehealth: Payer: Self-pay

## 2021-05-25 NOTE — Telephone Encounter (Signed)
Contains abnormal data  MYCOBACTERIA, CULTURE, WITH FLUOROCHROME SMEAR Order: [id number] Status: Preliminary result    Visible to patient: No (not released)    Next appt: 06/03/2021 at 08:30 AM in Oncology ([facility])    Dx: MAI (mycobacterium avium-intracellula...    Specimen Information: Sputum  0 Result Notes    Component 2 wk ago  MICRO NUMBER: [id number] P   SPECIMEN QUALITY: Adequate P   Source: SPUTUM P   STATUS: PRELIMINARY P   SMEAR: No acid-fast bacilli seen using the fluorochrome method. P   RESULT: Partially acid-fast organisms seen resembling Abnormal  P   Comment: Partially acid-fast organisms seen resembling Nocardia sp. Please call the laboratory within three (3) days if further identification or susceptibilities are required.  Resulting Agency [facility]         Specimen Collected: 05/10/21 10:09 Last Resulted: 05/25/21 13:43        Routing to provider to make aware of results.   Eugenia Mcalpine

## 2021-05-26 NOTE — Telephone Encounter (Signed)
[facility] notified to add sensitivities once AFB is identified.

## 2021-05-27 ENCOUNTER — Telehealth: Payer: Self-pay

## 2021-05-27 NOTE — Telephone Encounter (Signed)
Received call from [facility] to report update on Mycobacteria culture from 04/01/21 showing [place] . Report being faxed over.   Beryle Flock, RN

## 2021-05-30 NOTE — Progress Notes (Deleted)
Cardiology Office Note:    Date:  05/30/2021   ID:  Katrina Campbell, DOB 05-12-1955, MRN [id number]  PCP:  Carlena Hurl, PA-C   [facility] Providers Cardiologist:  Werner Lean, MD {    Referring MD: Carlena Hurl, PA-C    History of Present Illness:    Katrina Campbell is a 66 y.o. female with a hx of CAD, thoracic aortic aneurysm, pulmonary mycobacterium avium complex, HLD, COPD and stage III NSCLC on chemo who was followed by Dr. Meda Coffee now returning to clinic for follow-up.  Per review of the record, the patient was admitted to the hospital on 11/06/2017 with complaints of sudden onset of chest pain and dyspnea approximately 10 minutes after eating a carrot.  She was given nitroglycerin sublingually and apparently had about 30 seconds of syncope afterward.  Her systolic blood pressure dropped from 158 to 60.  She had a mild troponin elevation of 0.07 and a flat trend.  She underwent left heart cath on 11/07/2017 that showed mild nonobstructive CAD with 20% stenosis of her LAD and normal LV function.  Had coronary CTA on 03/03/20 for exertional chest pain with 25-49% distal LM, 25-49% prox LA, 25-49% prox-mid Lcx stenosis. Ca score 261 (90%). She was continued on medical management.   She was diagnosed with stage III NSCLC in 10/2020 for which she was started on carboplatin and paxclitaxel. TTE 12/24/20 with LVEF 55-60%, GLS -17%, normal RV, trivial MR, mild ascending aorta dilation 32m.  Last seen in clinic by Dr. CGasper Sellswhere she was stable from a CV standpoint.  Today, ***  Past Medical History:  Diagnosis Date   Anxiety    [place] [facility]    Coronary artery disease    Dental staining 07/15/2019   Depression    History of radiation therapy    Right lung- 11/03/20-12/15/20- Dr. JGery Pray  Hyperlipidemia    Labile hypertension    Myocardial infarction ([facility] 2018   Pneumonia    [place] mycobacterial [place]  10/24/2018    Past Surgical History:  Procedure Laterality Date   BALLOON DILATION N/A 04/29/2021   Procedure: BALLOON DILATION;  Surgeon: JMilus Banister MD;  Location: [facility];  Service: Endoscopy;  Laterality: N/A;   BIOPSY  01/14/2021   Procedure: BIOPSY;  Surgeon: IGarner Nash DO;  [facility];  Service: Pulmonary;;   BRONCHIAL BIOPSY  10/08/2020   Procedure: BRONCHIAL BIOPSIES;  Surgeon: IGarner Nash DO;  [facility];  Service: Pulmonary;;   BRONCHIAL BRUSHINGS  10/08/2020   Procedure: BRONCHIAL BRUSHINGS;  Surgeon: IGarner Nash DO;  [facility];  Service: Pulmonary;;   BRONCHIAL BRUSHINGS  01/14/2021   Procedure: BRONCHIAL BRUSHINGS;  Surgeon: IGarner Nash DO;  [facility];  Service: Pulmonary;;   BRONCHIAL WASHINGS  10/08/2020   Procedure: BRONCHIAL WASHINGS;  Surgeon: IGarner Nash DO;  [facility];  Service: Pulmonary;;   BRONCHIAL WASHINGS  01/14/2021   Procedure: BRONCHIAL WASHINGS;  Surgeon: IGarner Nash DO;  [facility];  Service: Pulmonary;;   ESOPHAGOGASTRODUODENOSCOPY (EGD) WITH PROPOFOL N/A 04/29/2021   Procedure: ESOPHAGOGASTRODUODENOSCOPY (EGD) WITH PROPOFOL;  Surgeon: JMilus Banister MD;  [facility];  Service: Endoscopy;  Laterality: N/A;   FINE NEEDLE ASPIRATION  10/08/2020   Procedure: FINE NEEDLE ASPIRATION (FNA) LINEAR;  Surgeon: IGarner Nash DO;  [place]  Service: [place];;   LEFT HEART CATH AND CORONARY ANGIOGRAPHY N/A 11/07/2017  Procedure: LEFT HEART CATH AND CORONARY ANGIOGRAPHY;  Surgeon: Nelva Bush, MD;  Location: [place] [facility];  Service: Cardiovascular;  Laterality: N/A;   NASAL SINUS SURGERY  1986   VIDEO BRONCHOSCOPY Bilateral 01/29/2019   Procedure: VIDEO BRONCHOSCOPY WITH FLUORO;  Surgeon: Marshell Garfinkel, MD;  Location: [place];  Service: Cardiopulmonary;  Laterality: Bilateral;   VIDEO BRONCHOSCOPY Right  01/14/2021   Procedure: VIDEO BRONCHOSCOPY WITHOUT FLUORO;  Surgeon: Garner Nash, DO;  Location: [place];  Service: Pulmonary;  Laterality: Right;  possible cryotherapy   VIDEO BRONCHOSCOPY WITH ENDOBRONCHIAL ULTRASOUND N/A 10/08/2020   Procedure: VIDEO BRONCHOSCOPY WITH ENDOBRONCHIAL ULTRASOUND;  Surgeon: Garner Nash, DO;  Location: [place];  Service: Pulmonary;  Laterality: N/A;    Current Medications: No outpatient medications have been marked as taking for the 06/04/21 encounter (Appointment) with Freada Bergeron, MD.     Allergies:   Breztri aerosphere [budeson-glycopyrrol-formoterol], Benadryl [diphenhydramine], Codeine, Prednisone, and Statins   Social History   Socioeconomic History   Marital status: Married    Spouse name: Not on file   Number of children: Not on file   Years of education: Not on file   Highest education level: Not on file  Occupational History   Not on file  Tobacco Use   Smoking status: Former    Packs/day: 1.75    Years: 44.00    Pack years: 77.00    Types: Cigarettes    Quit date: 11/09/2012    Years since quitting: 8.5   Smokeless tobacco: Never   Tobacco comments:    vapor  Vaping Use   Vaping Use: Former  Substance and Sexual Activity   Alcohol use: Not Currently    Alcohol/week: 24.0 standard drinks    Types: 24 Cans of beer per week   Drug use: Not Currently   Sexual activity: Not Currently  Other Topics Concern   Not on file  Social History Narrative   Not on file   Social Determinants of Health   Financial Resource Strain: Medium Risk   Difficulty of Paying Living Expenses: Somewhat hard  Food Insecurity: No Food Insecurity   Worried About Charity fundraiser in the Last Year: Never true   Ran Out of Food in the Last Year: Never true  Transportation Needs: No Transportation Needs   Lack of Transportation (Medical): No   Lack of Transportation (Non-Medical): No  Physical Activity: Not on file  Stress:  Stress Concern Present   Feeling of Stress : Rather much  Social Connections: Unknown   Frequency of Communication with Friends and Family: More than three times a week   Frequency of Social Gatherings with Friends and Family: More than three times a week   Attends Religious Services: More than 4 times per year   Active Member of [employer] or Organizations: Not on file   Attends Archivist Meetings: Not on file   Marital Status: Married     Family History: The patient's ***family history includes Asthma in an other family member; Congestive Heart Failure in her father and mother; Emphysema in her mother; Heart attack in her father and mother; Lung disease in her brother and sister. There is no history of Colon cancer.  ROS:   Please see the history of present illness.    *** All other systems reviewed and are negative.  EKGs/Labs/Other Studies Reviewed:    The following studies were reviewed today: TTE 01-10-2021: IMPRESSIONS   1. Left ventricular ejection fraction, by  estimation, is 55 to 60%. Left  ventricular ejection fraction by 3D volume is 62 %. The left ventricle has  normal function. The left ventricle has no regional wall motion  abnormalities. Left ventricular diastolic   parameters are indeterminate. The average left ventricular global  longitudinal strain is -17.7 %. The global longitudinal strain is normal.   2. Right ventricular systolic function is normal. The right ventricular  size is normal. Tricuspid regurgitation signal is inadequate for assessing  PA pressure.   3. Left atrial size was mildly dilated.   4. The mitral valve is normal in structure. Trivial mitral valve  regurgitation. No evidence of mitral stenosis.   5. The aortic valve is tricuspid. Aortic valve regurgitation is trivial.  No aortic stenosis is present.   6. Aortic dilatation noted. There is mild dilatation of the ascending  aorta, measuring 40 mm.   7. The inferior vena cava is normal  in size with greater than 50%  respiratory variability, suggesting right atrial pressure of 3 mmHg.   Comparison(s): No significant change from prior study.  Coronary CTA 02/2020: FINDINGS: A 100 kV prospective scan was triggered in the descending thoracic aorta at 111 HU's. Axial non-contrast 3 mm slices were carried out through the heart. The data set was analyzed on a dedicated work station and scored using the Danielson. Gantry rotation speed was 250 msecs and collimation was .6 mm. Metoprolol 100 mg PO and 0.8 mg of sl NTG was given. The 3D data set was reconstructed in 5% intervals of the 67-82 % of the R-R cycle. Diastolic phases were analyzed on a dedicated work station using MPR, MIP and VRT modes. The patient received 80 cc of contrast.   Aorta: Normal size. Mild diffuse atherosclerotic disease and calcifications. No dissection.   Aortic Valve:  Trileaflet.  No calcifications.   Coronary Arteries:  Normal coronary origin.  Left dominance.   RCA is a small non-dominant artery with luminal irregularities.   Left main is a large artery that gives rise to LAD and LCX arteries. Distal left main has mild to moderate eccentric calcified plaque with stenosis 25-49%.   LAD is a large vessel that gives rise to two diagonal arteries. There is mild calcified plaque in the proximal LAD with stenosis 25-49%. Mid and distal LAD has minimal plaque.   D1,2 have only minimal irregularities.   LCX is a large dominant artery that gives rise to PDA and PLA. There is mild calcified plaque in the proximal and mid LCX artery with stenosis 25-49%, otherwise only minimal non-calcified plaque.   Other findings:   Normal pulmonary vein drainage into the left atrium.   Normal left atrial appendage without a thrombus.   Normal size of the pulmonary artery.   IMPRESSION: 1. Coronary calcium score of 261. This was 49 percentile for age and sex matched control.   2. Normal coronary  origin with left dominance.   3. CAD-RADS 2. Mild non-obstructive CAD (25-49%) in the distal left main and proximal portions of LAD and LCX arteries. Consider non-atherosclerotic causes of dyspnea. Consider preventive therapy and risk factor modification.  Transthoracic Echocardiogram: Date: 03/11/2020 Results: 1. Left ventricular ejection fraction, by estimation, is 55 to 60%. The  left ventricle has normal function. The left ventricle has no regional  wall motion abnormalities. Left ventricular diastolic parameters are  consistent with Grade I diastolic  dysfunction (impaired relaxation).   2. Right ventricular systolic function is normal. The right ventricular  size is  normal. Tricuspid regurgitation signal is inadequate for assessing  PA pressure.   3. The mitral valve is normal in structure. Trivial mitral valve  regurgitation. No evidence of mitral stenosis.   4. The aortic valve is tricuspid. Aortic valve regurgitation is trivial.  No aortic stenosis is present.   5. Aortic dilatation noted. There is mild dilatation of the ascending  aorta, measuring 41 mm.   6. The inferior vena cava is normal in size with greater than 50%  respiratory variability, suggesting right atrial pressure of 3 mmHg.      NonCardiac CT: Date: 09/21/20 Results: IMPRESSION: 1. Subcarinal lymphadenopathy, increased since 06/19/2020 CT and new since 12/11/2019 CT, concerning for metastatic disease. Persistent branching tubular opacity in the anterior central right upper lobe associated with abrupt cut off of a segmental anterior right upper lobe bronchus, not substantially changed since 06/19/2020 CT. Underlying endobronchial malignancy not excluded. Multidisciplinary thoracic oncology consultation suggested. PET-CT recommended for further characterization. 2. Severe centrilobular emphysema with diffuse bronchial wall thickening, compatible with the provided history of COPD. 3. Left main and  two-vessel coronary atherosclerosis. 4. Aortic Atherosclerosis (ICD10-I70.0) and Emphysema (ICD10-J43.9). 5. Mild TAA 41 mm   NM Stress Testing: Date: 06/28/2016 Results: Nuclear stress EF: 58%. Blood pressure demonstrated a normal response to exercise. There was no ST segment deviation noted during stress. Defect 1: There is a small defect of moderate severity present in the apical anterior and apex location. The study is normal. This is a low risk study. The left ventricular ejection fraction is normal (55-65%).   Normal stress nuclear study with breast attenuation but no ischemia; EF 58 with normal wall motion.   Left/Right Heart Catheterizations: Date: 11/07/2017 Results: Conclusions: Eccentric calcification with mild stenosis of the ostial LAD (~20%).  Otherwise, no angiographically significant coronary artery disease. Normal left ventricular systolic function and filling pressure.   Recommendations: Continue medical therapy and risk factor modification to prevent progression of CAD. Monitor overnight to ensure the patient does not have recurrent chest pain.  Anticipate discharge home tomorrow morning.   Recommend Aspirin 47m daily for mild CAD and thoracic aortic aneurysm.   EKG:  EKG is *** ordered today.  The ekg ordered today demonstrates ***  Recent Labs: 09/22/2020: Magnesium 2.2 05/06/2021: ALT 29; BUN 15; Creatinine 0.77; Hemoglobin 12.0; Platelet Count 160; Potassium 4.0; Sodium 137; TSH 27.827  Recent Lipid Panel    Component Value Date/Time   CHOL 163 03/04/2021 0731   TRIG 122 03/04/2021 0731   HDL 50 03/04/2021 0731   CHOLHDL 3.3 03/04/2021 0731   CHOLHDL 3.4 02/21/2018 0845   VLDL 38 (H) 12/30/2015 1536   LDLCALC 91 03/04/2021 0731   LDLCALC 83 02/21/2018 0845     Risk Assessment/Calculations:   {Does this patient have ATRIAL FIBRILLATION?:[phone]}       Physical Exam:    VS:  There were no vitals taken for this visit.    Wt Readings from  Last 3 Encounters:  05/07/21 132 lb 9.6 oz (60.1 kg)  05/06/21 132 lb 9.6 oz (60.1 kg)  04/29/21 131 lb (59.4 kg)     GEN: *** Well nourished, well developed in no acute distress HEENT: Normal NECK: No JVD; No carotid bruits LYMPHATICS: No lymphadenopathy CARDIAC: ***RRR, no murmurs, rubs, gallops RESPIRATORY:  Clear to auscultation without rales, wheezing or rhonchi  ABDOMEN: Soft, non-tender, non-distended MUSCULOSKELETAL:  No edema; No deformity  SKIN: Warm and dry NEUROLOGIC:  Alert and oriented x 3 PSYCHIATRIC:  Normal affect  ASSESSMENT:    No diagnosis found. PLAN:    In order of problems listed above:  #Mild Nonobstructive CAD: Patient with mild disease on cath in 2019 and mild multivessel disease on coronary CTA in 2021. Ca score 261 (90%) currently on medical management. No anginal symptoms. TTE with normal BiV function. -Continue crestor 85m, zetia 1105mdaily -Continue ASA 8138maily  #NSCLC on chemo/XRT and immunotherapy: Stage IIIA non small cell lung cancer diagnosed in 10/2020. Received carboplatin and paclitaxel. Also on immunotherapy with Imfinzi. Followed by Dr. MohJulien NordmannTE with LVEF 55-60% with low normal strain -17.7%. Will need continued surveillance. -Follow-up with onc as scheduled -Will continue monitoring with TTE with strain  #TAA: Stable on most recent TTE measuring 27m2m-Continue serial monitor with serial imaging      {Are you ordering a CV Procedure (e.g. stress test, cath, DCCV, TEE, etc)?   Press F2        :[id number]}Medication Adjustments/Labs and Tests Ordered: Current medicines are reviewed at length with the patient today.  Concerns regarding medicines are outlined above.  No orders of the defined types were placed in this encounter.  No orders of the defined types were placed in this encounter.   There are no Patient Instructions on file for this visit.   Signed, HeatFreada Bergeron  05/30/2021 8:09 PM    [place]

## 2021-06-01 ENCOUNTER — Telehealth: Payer: Self-pay | Admitting: Medical

## 2021-06-01 NOTE — Progress Notes (Signed)
[facility] OFFICE PROGRESS NOTE  Carlena Hurl, PA-C [place] [address]  DIAGNOSIS: Stage IIIA (TX, N2, M0) non-small cell lung cancer, squamous cell carcinoma presented with subcarinal mass/lymphadenopathy diagnosed in July 2022.    PRIOR THERAPY:  Weekly carboplatin for AUC of 2 and paclitaxel 45 mg/m2.  First dose expected on 11/02/2020.  Status post 7 cycles.  Last dose was given 12/14/2020  CURRENT THERAPY:  Consolidation treatment with immunotherapy with Imfinzi 1500 Mg IV every 4 weeks.  First dose 01/14/2021.  Status post 5 cycles  INTERVAL HISTORY: Katrina Campbell 66 y.o. female returns to the clinic today for a follow-up visit.  The patient was last seen in clinic 1 month ago.   The patient denies any recent fevers, chills, or night sweats.  Her weight is stable.  She reports her baseline dyspnea on exertion.  Denies any cough, chest pain, or hemoptysis. She denies any nausea, vomiting, diarrhea, or constipation; however, she notes her bowels are not as regular as they used to be since being diagnosed with hypothyroidism. Her PCP readjusted her Synthroid dose on 05/07/21 to 88 mcg. She notes some insomnia and dry mouth which she attributed to occurring when she started synthroid.  Denies any headache or visual changes.  Denies any rashes or skin changes.  Dr. Ardis Hughs performed a upper endoscopy on 04/29/2021 due to the patient reporting the sensation of food getting stuck in her throat since completing radiation.  There was one benign-appearing intrinsic mild stenosis found in the GE junction.  This was dilated. She continues to have similar symptoms and has modified her eating habits due to this. She notes she has to be careful when eating cornbread and meat.   She also sees Dr. Valeta Harms for COPD management.  She also follows with infectious disease due to her history of MAI.   She is here today for evaluation and repeat blood work before starting cycle  #6.  MEDICAL HISTORY: Past Medical History:  Diagnosis Date   Anxiety    COPD (chronic obstructive pulmonary disease) ([place])    Coronary artery disease    Dental staining 07/15/2019   Depression    History of radiation therapy    Right lung- 11/03/20-12/15/20- Dr. Gery Pray   Hyperlipidemia    Labile hypertension    Myocardial infarction 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  MEDICATIONS:  Current Outpatient Medications  Medication Sig Dispense Refill   acetaminophen (TYLENOL) 500 MG tablet Take 500 mg by mouth every 6 (six) hours as needed for moderate pain or headache.     albuterol (PROVENTIL) (2.5 MG/3ML) 0.083% nebulizer solution Take 3 mLs (2.5 mg total) by nebulization every 6 (six) hours as needed for wheezing or shortness of breath. (Patient taking differently: Take 2.5 mg by nebulization 3 (three) times daily.) 540 mL 3   albuterol (VENTOLIN HFA) 108 (90 Base) MCG/ACT inhaler Inhale 2 puffs into the lungs every 6 (six) hours as needed for wheezing or shortness of breath.     aspirin EC 81 MG tablet Take 81 mg by mouth daily with lunch.      buPROPion (WELLBUTRIN XL) 300 MG 24 hr tablet Take 1 tablet (300 mg total) by mouth daily. 90 tablet 0   Cholecalciferol (DIALYVITE VITAMIN D 5000) 125 MCG (5000 UT) capsule Take 5,000 Units by mouth daily.  esomeprazole (NEXIUM) 40 MG capsule Take 1 capsule (40 mg total) by mouth daily. 90 capsule 3   ezetimibe (ZETIA) 10 MG tablet Take 1 tablet (10 mg total) by mouth daily. 90 tablet 1   Fluticasone-Umeclidin-Vilant (TRELEGY ELLIPTA) 100-62.5-25 MCG/ACT AEPB Inhale 1 puff into the lungs daily. 180 each 3   Guaifenesin (MUCINEX MAXIMUM STRENGTH) 1200 MG TB12 Take 1,200 mg by mouth 2 (two) times daily.     levothyroxine (SYNTHROID) 88 MCG tablet Take  1 tablet (88 mcg total) by mouth daily. 30 tablet 2   Magnesium 500 MG TABS Take 500 mg by mouth daily with lunch.     Multiple Vitamins-Minerals (IMMUNE SUPPORT PO) Take 2 tablets by mouth daily.     Respiratory Therapy Supplies (FLUTTER) DEVI Use as directed 1 each 0   rosuvastatin (CRESTOR) 10 MG tablet Take 1 tablet (10 mg total) by mouth daily. 90 tablet 3   sodium chloride (OCEAN) 0.65 % SOLN nasal spray Place 1 spray into both nostrils daily as needed for congestion.     temazepam (RESTORIL) 15 MG capsule Take 1 capsule (15 mg total) by mouth at bedtime as needed. 90 capsule 1   UNABLE TO FIND Med Name: immunotherapy infusion once per month     vitamin B-12 (CYANOCOBALAMIN) 1000 MCG tablet Take 1,000 mcg by mouth daily.     No current facility-administered medications for this visit.   Facility-Administered Medications Ordered in Other Visits  Medication Dose Route Frequency Provider Last Rate Last Admin   Sonafine emulsion 1 application  1 application Topical Once Gery Pray, MD        SURGICAL HISTORY:  Past Surgical History:  Procedure Laterality Date   BALLOON DILATION N/A 04/29/2021   Procedure: BALLOON DILATION;  Surgeon: Milus Banister, MD;  Location: [facility];  Service: Endoscopy;  Laterality: N/A;   BIOPSY  01/14/2021   Procedure: BIOPSY;  Surgeon: Garner Nash, DO;  Location: [facility];  Service: Pulmonary;;   BRONCHIAL BIOPSY  10/08/2020   Procedure: BRONCHIAL BIOPSIES;  Surgeon: Garner Nash, DO;  Location: [facility];  Service: Pulmonary;;   BRONCHIAL BRUSHINGS  10/08/2020   Procedure: BRONCHIAL BRUSHINGS;  Surgeon: Garner Nash, DO;  Location: [facility];  Service: Pulmonary;;   BRONCHIAL BRUSHINGS  01/14/2021   Procedure: BRONCHIAL BRUSHINGS;  Surgeon: Garner Nash, DO;  Location: [facility];  Service: Pulmonary;;   BRONCHIAL WASHINGS  10/08/2020   Procedure: BRONCHIAL WASHINGS;  Surgeon: Garner Nash, DO;  Location: [facility];   Service: Pulmonary;;   BRONCHIAL WASHINGS  01/14/2021   Procedure: BRONCHIAL WASHINGS;  Surgeon: Garner Nash, DO;  Location: [facility];  Service: Pulmonary;;   ESOPHAGOGASTRODUODENOSCOPY (EGD) WITH PROPOFOL N/A 04/29/2021   Procedure: ESOPHAGOGASTRODUODENOSCOPY (EGD) WITH PROPOFOL;  Surgeon: Milus Banister, MD;  Location: [facility];  Service: Endoscopy;  Laterality: N/A;   FINE NEEDLE ASPIRATION  10/08/2020   Procedure: FINE NEEDLE ASPIRATION (FNA) LINEAR;  Surgeon: Garner Nash, DO;  Location: [facility];  Service: Pulmonary;;   LEFT HEART CATH AND CORONARY ANGIOGRAPHY N/A 11/07/2017   Procedure: LEFT HEART CATH AND CORONARY ANGIOGRAPHY;  Surgeon: Nelva Bush, MD;  Location: [facility];  Service: Cardiovascular;  Laterality: N/A;   NASAL SINUS SURGERY  1986   VIDEO BRONCHOSCOPY Bilateral 01/29/2019   Procedure: VIDEO BRONCHOSCOPY WITH FLUORO;  Surgeon: Marshell Garfinkel, MD;  Location: [facility];  Service: Cardiopulmonary;  Laterality: Bilateral;   VIDEO BRONCHOSCOPY Right 01/14/2021   Procedure: VIDEO  BRONCHOSCOPY WITHOUT FLUORO;  Surgeon: Garner Nash, DO;  Location: [facility];  Service: Pulmonary;  Laterality: Right;  possible cryotherapy   VIDEO BRONCHOSCOPY WITH ENDOBRONCHIAL ULTRASOUND N/A 10/08/2020   Procedure: VIDEO BRONCHOSCOPY WITH ENDOBRONCHIAL ULTRASOUND;  Surgeon: Garner Nash, DO;  Location: [facility];  Service: Pulmonary;  Laterality: N/A;    REVIEW OF SYSTEMS:   Review of Systems  Constitutional: Negative for appetite change, chills, fatigue, fever and unexpected weight change.  HENT: Positive for occasional dysphagia. Positive for dry mouth. Negative for mouth sores, nosebleeds, sore throat and trouble swallowing.   Eyes: Negative for eye problems and icterus.  Respiratory: Positive for dyspnea on exertion. Negative for cough, hemoptysis, shortness of breath and wheezing.   Cardiovascular: Negative for chest pain and leg swelling.   Gastrointestinal: Negative for abdominal pain, constipation, diarrhea, nausea and vomiting.  Genitourinary: Negative for bladder incontinence, difficulty urinating, dysuria, frequency and hematuria.   Musculoskeletal: Negative for back pain, gait problem, neck pain and neck stiffness.  Skin: Negative for itching and rash.  Neurological: Negative for dizziness, extremity weakness, gait problem, headaches, light-headedness and seizures.  Hematological: Negative for adenopathy. Does not bruise/bleed easily.  Psychiatric/Behavioral: Negative for confusion, depression and sleep disturbance. The patient is not nervous/anxious.       PHYSICAL EXAMINATION:  Blood pressure (!) 159/77, pulse 79, temperature 98.4 F (36.9 C), temperature source Tympanic, weight 131 lb 6.4 oz (59.6 kg), SpO2 95 %.  ECOG PERFORMANCE STATUS: 1  Physical Exam  Constitutional: Oriented to person, place, and time and well-developed, well-nourished, and in no distress.  HENT:  Head: Normocephalic and atraumatic.  Mouth/Throat: Oropharynx is clear and moist. No oropharyngeal exudate.  Eyes: Conjunctivae are normal. Right eye exhibits no discharge. Left eye exhibits no discharge. No scleral icterus.  Neck: Normal range of motion. Neck supple.  Cardiovascular: Normal rate, regular rhythm, normal heart sounds and intact distal pulses.   Pulmonary/Chest: Effort normal. Quiet breath sounds bilaterally. No respiratory distress. No wheezes. No rales.  Abdominal: Soft. Bowel sounds are normal. Exhibits no distension and no mass. There is no tenderness.  Musculoskeletal: Normal range of motion. Exhibits no edema.  Lymphadenopathy:    No cervical adenopathy.  Neurological: Alert and oriented to person, place, and time. Exhibits normal muscle tone. Gait normal. Coordination normal.  Skin: Skin is warm and dry. No rash noted. Not diaphoretic. No erythema. No pallor.  Psychiatric: Mood, memory and judgment normal.  Vitals  reviewed.  LABORATORY DATA: Lab Results  Component Value Date   WBC 6.0 06/03/2021   HGB 11.6 (L) 06/03/2021   HCT 35.5 (L) 06/03/2021   MCV 83.5 06/03/2021   PLT 155 06/03/2021      Chemistry      Component Value Date/Time   NA 137 06/03/2021 0822   NA 144 02/10/2020 0813   K 3.9 06/03/2021 0822   CL 104 06/03/2021 0822   CO2 26 06/03/2021 0822   BUN 11 06/03/2021 0822   BUN 10 02/10/2020 0813   CREATININE 0.76 06/03/2021 0822   CREATININE 0.84 11/11/2019 1411      Component Value Date/Time   CALCIUM 9.2 06/03/2021 0822   ALKPHOS 109 06/03/2021 0822   AST 27 06/03/2021 0822   ALT 25 06/03/2021 0822   BILITOT 0.4 06/03/2021 0822       RADIOGRAPHIC STUDIES:  No results found.   ASSESSMENT/PLAN:  This is a very pleasant 66 year old Caucasian female diagnosed with stage IIIa (Tx, N2, M0) non-small cell lung cancer, squamous  cell carcinoma.  She presented with a subcarinal mass/lymphadenopathy.  She was diagnosed in July 2022.   The patient is not a candidate for surgical resection due to the location of the tumor.  Therefore, the patient completed a course of concurrent chemoradiation with weekly carboplatin for an AUC of 2 and paclitaxel 45 mg per metered square.  She is status post 7 cycles.   She had a repeat CT scan of the chest performed previously which showed improvement in the subcarinal lymph node which is a presentation of her disease.  Also showed collapse of the right middle lobe that was biopsy-proven to be consistent with malignancy  The patient is currently undergoing consolidation immunotherapy with Imfinzi 1500 mg IV every 4 weeks.  She status post 5 cycles.    Labs were reviewed.  Recommend that she proceed with cycle #6 today as scheduled.  I will arrange for restaging CT scan in 4 weeks for evaluation and to review her scan results before starting cycle #7. She requested this be done at [facility]. I have placed the order at [facility] but recommended she call them to schedule her scan for 3/27 or 3/28 so this can be available when we see her on 3/30 for her next appointment.   She will continue to follow with her PCP regarding her hypothyroidism. She requested that we forward the results to him once available. I discussed the constipation and dry mouth may moreso attributed to thyroid dysfunction as opposed to a medication side effect.   She will continue to follow with GI and pulmonology for her COPD.  The patient was advised to call immediately if she has any concerning symptoms in the interval. The patient voices understanding of current disease status and treatment options and is in agreement with the current care plan. All questions were answered. The patient knows to call the clinic with any problems, questions or concerns. We can certainly see the patient much sooner if necessary           Orders Placed This Encounter  Procedures   CT Chest W Contrast    Please schedule on 3/27 or 3/28 no earlier or later    Standing Status:   Future    Standing Expiration Date:   06/03/2022    Scheduling Instructions:     Please schedule on 3/27 or 3/28 no earlier or later    Order Specific Question:   If indicated for the ordered procedure, I authorize the administration of contrast media per Radiology protocol    Answer:   Yes    Order Specific Question:   Preferred imaging location?    Answer:   [facility]     The total time spent in the appointment was 20-29 minutes.   Courvoisier Hamblen L Grason Brailsford, PA-C 06/03/21

## 2021-06-01 NOTE — Telephone Encounter (Signed)
Spoke with patient to schedule Medicare Annual Wellness Visit (AWV) either virtually or in office. She stated she didn't want to schedule right now  wanted a call back in the summer  awvi 09/03/19 per [facility] please schedule at anytime with health coach  This should be a 45 minute visit.

## 2021-06-03 ENCOUNTER — Other Ambulatory Visit: Payer: Self-pay

## 2021-06-03 ENCOUNTER — Ambulatory Visit: Payer: Medicare Other | Admitting: Physician Assistant

## 2021-06-03 ENCOUNTER — Inpatient Hospital Stay (HOSPITAL_BASED_OUTPATIENT_CLINIC_OR_DEPARTMENT_OTHER): Payer: Medicare Other | Admitting: Physician Assistant

## 2021-06-03 ENCOUNTER — Inpatient Hospital Stay: Payer: Medicare Other

## 2021-06-03 ENCOUNTER — Inpatient Hospital Stay: Payer: Medicare Other | Attending: Physician Assistant

## 2021-06-03 ENCOUNTER — Ambulatory Visit: Payer: Medicare Other

## 2021-06-03 ENCOUNTER — Other Ambulatory Visit: Payer: Medicare Other

## 2021-06-03 ENCOUNTER — Encounter: Payer: Self-pay | Admitting: Physician Assistant

## 2021-06-03 ENCOUNTER — Encounter: Payer: Self-pay | Admitting: Internal Medicine

## 2021-06-03 VITALS — BP 159/77 | HR 79 | Temp 98.4°F | Wt 131.4 lb

## 2021-06-03 DIAGNOSIS — Z79899 Other long term (current) drug therapy: Secondary | ICD-10-CM | POA: Diagnosis not present

## 2021-06-03 DIAGNOSIS — Z5112 Encounter for antineoplastic immunotherapy: Secondary | ICD-10-CM

## 2021-06-03 DIAGNOSIS — R5382 Chronic fatigue, unspecified: Secondary | ICD-10-CM

## 2021-06-03 DIAGNOSIS — C3491 Malignant neoplasm of unspecified part of right bronchus or lung: Secondary | ICD-10-CM | POA: Insufficient documentation

## 2021-06-03 LAB — CBC WITH DIFFERENTIAL (CANCER CENTER ONLY)
Abs Immature Granulocytes: 0.01 10*3/uL (ref 0.00–0.07)
Basophils Absolute: 0 10*3/uL (ref 0.0–0.1)
Basophils Relative: 0 %
Eosinophils Absolute: 0 10*3/uL (ref 0.0–0.5)
Eosinophils Relative: 1 %
HCT: 35.5 % — ABNORMAL LOW (ref 36.0–46.0)
Hemoglobin: 11.6 g/dL — ABNORMAL LOW (ref 12.0–15.0)
Immature Granulocytes: 0 %
Lymphocytes Relative: 15 %
Lymphs Abs: 0.9 10*3/uL (ref 0.7–4.0)
MCH: 27.3 pg (ref 26.0–34.0)
MCHC: 32.7 g/dL (ref 30.0–36.0)
MCV: 83.5 fL (ref 80.0–100.0)
Monocytes Absolute: 0.4 10*3/uL (ref 0.1–1.0)
Monocytes Relative: 6 %
Neutro Abs: 4.7 10*3/uL (ref 1.7–7.7)
Neutrophils Relative %: 78 %
Platelet Count: 155 10*3/uL (ref 150–400)
RBC: 4.25 MIL/uL (ref 3.87–5.11)
RDW: 14.6 % (ref 11.5–15.5)
WBC Count: 6 10*3/uL (ref 4.0–10.5)
nRBC: 0 % (ref 0.0–0.2)

## 2021-06-03 LAB — CMP (CANCER CENTER ONLY)
ALT: 25 U/L (ref 0–44)
AST: 27 U/L (ref 15–41)
Albumin: 4.2 g/dL (ref 3.5–5.0)
Alkaline Phosphatase: 109 U/L (ref 38–126)
Anion gap: 7 (ref 5–15)
BUN: 11 mg/dL (ref 8–23)
CO2: 26 mmol/L (ref 22–32)
Calcium: 9.2 mg/dL (ref 8.9–10.3)
Chloride: 104 mmol/L (ref 98–111)
Creatinine: 0.76 mg/dL (ref 0.44–1.00)
GFR, Estimated: >60 mL/min (ref >60)
Glucose, Bld: 86 mg/dL (ref 70–99)
Potassium: 3.9 mmol/L (ref 3.5–5.1)
Sodium: 137 mmol/L (ref 135–145)
Total Bilirubin: 0.4 mg/dL (ref 0.3–1.2)
Total Protein: 7.3 g/dL (ref 6.5–8.1)

## 2021-06-03 LAB — TSH: TSH: 8.563 u[IU]/mL — ABNORMAL HIGH (ref 0.308–3.960)

## 2021-06-03 MED ORDER — SODIUM CHLORIDE 0.9 % IV SOLN: Freq: Once | INTRAVENOUS | Status: AC

## 2021-06-03 MED ORDER — SODIUM CHLORIDE 0.9 % IV SOLN
1500.0000 mg | Freq: Once | INTRAVENOUS | Status: AC
  Administered 2021-06-03: 1500 mg via INTRAVENOUS
  Filled 2021-06-03: qty 30

## 2021-06-03 NOTE — Patient Instructions (Signed)
[facility] MEDICAL [facility]COLOGY  Discharge Instructions: Thank you for choosing [facility] to provide your oncology and hematology care.   If you have a lab appointment with the Cancer Center, please go directly to the Cancer Center and check in at the registration area.   Wear comfortable clothing and clothing appropriate for easy access to any Portacath or PICC line.   We strive to give you quality time with your provider. You may need to reschedule your appointment if you arrive late (15 or more minutes).  Arriving late affects you and other patients whose appointments are after yours.  Also, if you miss three or more appointments without notifying the office, you may be dismissed from the clinic at the provider's discretion.      For prescription refill requests, have your pharmacy contact our office and allow 72 hours for refills to be completed.    Today you received the following chemotherapy and/or immunotherapy agents Imfinzi      To help prevent nausea and vomiting after your treatment, we encourage you to take your nausea medication as directed.  BELOW ARE SYMPTOMS THAT SHOULD BE REPORTED IMMEDIATELY: *FEVER GREATER THAN 100.4 F (38 C) OR HIGHER *CHILLS OR SWEATING *NAUSEA AND VOMITING THAT IS NOT CONTROLLED WITH YOUR NAUSEA MEDICATION *UNUSUAL SHORTNESS OF BREATH *UNUSUAL BRUISING OR BLEEDING *URINARY PROBLEMS (pain or burning when urinating, or frequent urination) *BOWEL PROBLEMS (unusual diarrhea, constipation, pain near the anus) TENDERNESS IN MOUTH AND THROAT WITH OR WITHOUT PRESENCE OF ULCERS (sore throat, sores in mouth, or a toothache) UNUSUAL RASH, SWELLING OR PAIN  UNUSUAL VAGINAL DISCHARGE OR ITCHING   Items with * indicate a potential emergency and should be followed up as soon as possible or go to the Emergency Department if any problems should occur.  Please show the CHEMOTHERAPY ALERT CARD or IMMUNOTHERAPY ALERT CARD at check-in to the  Emergency Department and triage nurse.  Should you have questions after your visit or need to cancel or reschedule your appointment, please contact [facility]  Dept: [phone]  and follow the prompts.  Office hours are 8:00 a.m. to 4:30 p.m. Monday - Friday. Please note that voicemails left after 4:00 p.m. may not be returned until the following business day.  We are closed weekends and major holidays. You have access to a nurse at all times for urgent questions. Please call the main number to the clinic Dept: [phone] and follow the prompts.   For any non-urgent questions, you may also contact your provider using MyChart. We now offer e-Visits for anyone 18 and older to request care online for non-urgent symptoms. For details visit [email or web address].   Also download the MyChart app! Go to the app store, search "MyChart", open the app, select [facility], and log in with your MyChart username and password.  Due to Covid, a mask is required upon entering the hospital/clinic. If you do not have a mask, one will be given to you upon arrival. For doctor visits, patients may have 1 support person aged 18 or older with them. For treatment visits, patients cannot have anyone with them due to current Covid guidelines and our immunocompromised population.   

## 2021-06-04 ENCOUNTER — Ambulatory Visit (INDEPENDENT_AMBULATORY_CARE_PROVIDER_SITE_OTHER): Payer: Medicare Other | Admitting: Cardiology

## 2021-06-04 ENCOUNTER — Encounter: Payer: Self-pay | Admitting: Physician Assistant

## 2021-06-04 ENCOUNTER — Encounter: Payer: Self-pay | Admitting: Cardiology

## 2021-06-04 ENCOUNTER — Ambulatory Visit: Payer: Medicare Other | Admitting: Internal Medicine

## 2021-06-04 VITALS — BP 148/84 | HR 79 | Ht 64.0 in | Wt 131.8 lb

## 2021-06-04 DIAGNOSIS — Z9221 Personal history of antineoplastic chemotherapy: Secondary | ICD-10-CM

## 2021-06-04 DIAGNOSIS — Z79899 Other long term (current) drug therapy: Secondary | ICD-10-CM | POA: Diagnosis not present

## 2021-06-04 DIAGNOSIS — E785 Hyperlipidemia, unspecified: Secondary | ICD-10-CM

## 2021-06-04 DIAGNOSIS — E782 Mixed hyperlipidemia: Secondary | ICD-10-CM

## 2021-06-04 DIAGNOSIS — I2510 Atherosclerotic heart disease of native coronary artery without angina pectoris: Secondary | ICD-10-CM

## 2021-06-04 DIAGNOSIS — E781 Pure hyperglyceridemia: Secondary | ICD-10-CM

## 2021-06-04 DIAGNOSIS — I7121 Aneurysm of the ascending aorta, without rupture: Secondary | ICD-10-CM

## 2021-06-04 LAB — LIPID PANEL
Chol/HDL Ratio: 2.5 ratio (ref 0.0–4.4)
Cholesterol, Total: 130 mg/dL (ref 100–199)
HDL: 52 mg/dL (ref >39)
LDL Chol Calc (NIH): 63 mg/dL (ref 0–99)
Triglycerides: 73 mg/dL (ref 0–149)
VLDL Cholesterol Cal: 15 mg/dL (ref 5–40)

## 2021-06-04 NOTE — Patient Instructions (Signed)
Medication Instructions:  ? ?START TAKING CRESTOR AS A TRIAL AND MYCHART [place] BACK WITH YOUR TOLERANCE LEVEL TO THIS MEDICATION, SO WE CAN SEND THIS INTO YOUR PHARMACY ? ?*If you need a refill on your cardiac medications before your next appointment, please call your pharmacy* ? ? ?Lab Work: ? ?TODAY--LIPIDS ? ?If you have labs (blood work) drawn today and your tests are completely normal, you will receive your results only by: ?MyChart Message (if you have MyChart) OR ?A paper copy in the mail ?If you have any lab test that is abnormal or we need to change your treatment, we will call you to review the results. ? ? ?Testing/Procedures: ? ?Your physician has requested that you have an echocardiogram. Echocardiography is a painless test that uses sound waves to create images of your heart. It provides your doctor with information about the size and shape of your heart and how well your heart?s chambers and valves are working. This procedure takes approximately one hour. There are no restrictions for this procedure.  SCHEDULE TO HAVE DONE IN 6 MONTHS AND DO ECHO WITH STRAIN PER DR. Johney Frame ? ? ?Follow-Up: ?At [facility], you and your health needs are our priority.  As part of our continuing mission to provide you with exceptional heart care, we have created designated Provider Care Teams.  These Care Teams include your primary Cardiologist (physician) and Advanced Practice Providers (APPs -  Physician Assistants and Nurse Practitioners) who all work together to provide you with the care you need, when you need it. ? ?We recommend signing up for the patient portal called "MyChart".  Sign up information is provided on this After Visit Summary.  MyChart is used to connect with patients for Virtual Visits (Telemedicine).  Patients are able to view lab/test results, encounter notes, upcoming appointments, etc.  Non-urgent messages can be sent to your provider as well.   ?To learn more about what you can do with  MyChart, go to [email or web address].   ? ?Your next appointment:   ?6 month(s) ? ?The format for your next appointment:   ?In Person ? ?Provider:   ?DR. PEMBERTON ? ?

## 2021-06-04 NOTE — Progress Notes (Signed)
Cardiology Office Note:    Date:  06/04/2021   ID:  Katrina Campbell, DOB 09-18-1955, MRN [id number]  PCP:  Carlena Hurl, PA-C   [facility] Providers Cardiologist:  Werner Lean, MD {   Referring MD: Carlena Hurl, PA-C   History of Present Illness:    Katrina Campbell is a 66 y.o. female with a hx of CAD, thoracic aortic aneurysm, pulmonary mycobacterium avium complex, HLD, COPD and stage III NSCLC on chemo who was followed by Dr. Meda Coffee now returning to clinic for follow-up.  Per review of the record, the patient was admitted to the hospital on 11/06/2017 with complaints of sudden onset of chest pain and dyspnea approximately 10 minutes after eating a carrot.  She was given nitroglycerin sublingually and apparently had about 30 seconds of syncope afterward.  Her systolic blood pressure dropped from 158 to 60.  She had a mild troponin elevation of 0.07 and a flat trend.  She underwent left heart cath on 11/07/2017 that showed mild nonobstructive CAD with 20% stenosis of her LAD and normal LV function.  Had coronary CTA on 03/03/20 for exertional chest pain with 25-49% distal LM, 25-49% prox LA, 25-49% prox-mid Lcx stenosis. Ca score 261 (90%). She was continued on medical management.   She was diagnosed with stage III NSCLC in 10/2020 for which she was started on carboplatin and paxclitaxel. TTE 12/24/20 with LVEF 55-60%, GLS -17%, normal RV, trivial MR, mild ascending aorta dilation 53m.  Last seen in clinic by Dr. CGasper Sellswhere she was stable from a CV standpoint.  Today, the patient states that she is feeling pretty good, considering. She is currently on immunotherapy. A week ago she was started on 841MCG Synthroid, and she is experiencing insomnia and a lot of what she describes as "ups and downs."   Sometimes she feels "jittery," which she attributes to her medications as well. However, her jittery feelings are random and not overly bothersome for her.  For exercise she  continues to walk on the treadmill 30 minutes a day. No anginal symptoms.   She denies any chest pain, shortness of breath, or peripheral edema. No lightheadedness, headaches, syncope, orthopnea, or PND.   Past Medical History:  Diagnosis Date   Anxiety    COPD (chronic obstructive pulmonary disease) ([facility]    Coronary artery disease    Dental staining 07/15/2019   Depression    History of radiation therapy    Right lung- 11/03/20-12/15/20- Dr. JGery Pray  Hyperlipidemia    Labile hypertension    Myocardial infarction ([facility] 2018   Pneumonia    [place] mycobacterial infection [place] 10/24/2018    Past Surgical History:  Procedure Laterality Date   BALLOON DILATION N/A 04/29/2021   Procedure: BALLOON DILATION;  Surgeon: JMilus Banister MD;  [facility];  Service: Endoscopy;  Laterality: N/A;   BIOPSY  01/14/2021   Procedure: BIOPSY;  Surgeon: IGarner Nash DO;  [facility];  Service: Pulmonary;;   BRONCHIAL BIOPSY  10/08/2020   Procedure: BRONCHIAL BIOPSIES;  Surgeon: IGarner Nash DO;  [facility];  Service: Pulmonary;;   BRONCHIAL BRUSHINGS  10/08/2020   Procedure: BRONCHIAL BRUSHINGS;  Surgeon: IGarner Nash DO;  [place];  Service: Pulmonary;;   BRONCHIAL BRUSHINGS  01/14/2021   Procedure: BRONCHIAL BRUSHINGS;  Surgeon: IGarner Nash DO;  [facility];  Service: Pulmonary;;   BRONCHIAL WASHINGS  10/08/2020   Procedure: BRONCHIAL WASHINGS;  Surgeon: IJune Leap  L, DO;  Location: [place] [facility];  Service: Pulmonary;;   BRONCHIAL WASHINGS  01/14/2021   Procedure: BRONCHIAL WASHINGS;  Surgeon: Garner Nash, DO;  Location: [place] [facility];  Service: Pulmonary;;   ESOPHAGOGASTRODUODENOSCOPY (EGD) WITH PROPOFOL N/A 04/29/2021   Procedure: ESOPHAGOGASTRODUODENOSCOPY (EGD) WITH PROPOFOL;  Surgeon: Milus Banister, MD;  [facility]: [facility];  Service: Endoscopy;  Laterality: N/A;   FINE NEEDLE ASPIRATION   10/08/2020   Procedure: FINE NEEDLE ASPIRATION (FNA) LINEAR;  Surgeon: Garner Nash, DO;  Location: [place] [facility];  Service: Pulmonary;;   LEFT HEART CATH AND CORONARY ANGIOGRAPHY N/A 11/07/2017   Procedure: LEFT HEART CATH AND CORONARY ANGIOGRAPHY;  Surgeon: Nelva Bush, MD;  Location: [place] [facility];  Service: Cardiovascular;  Laterality: N/A;   NASAL SINUS SURGERY  1986   VIDEO BRONCHOSCOPY Bilateral 01/29/2019   Procedure: VIDEO BRONCHOSCOPY WITH FLUORO;  Surgeon: Marshell Garfinkel, MD;  Location: [place];  Service: Cardiopulmonary;  Laterality: Bilateral;   VIDEO BRONCHOSCOPY Right 01/14/2021   Procedure: VIDEO BRONCHOSCOPY WITHOUT FLUORO;  Surgeon: Garner Nash, DO;  Location: [place];  Service: Pulmonary;  Laterality: Right;  possible cryotherapy   VIDEO BRONCHOSCOPY WITH ENDOBRONCHIAL ULTRASOUND N/A 10/08/2020   Procedure: VIDEO BRONCHOSCOPY WITH ENDOBRONCHIAL ULTRASOUND;  Surgeon: Garner Nash, DO;  Location: [place];  Service: Pulmonary;  Laterality: N/A;    Current Medications: Current Meds  Medication Sig   acetaminophen (TYLENOL) 500 MG tablet Take 500 mg by mouth every 6 (six) hours as needed for moderate pain or headache.   albuterol (PROVENTIL) (2.5 MG/3ML) 0.083% nebulizer solution Take 3 mLs (2.5 mg total) by nebulization every 6 (six) hours as needed for wheezing or shortness of breath.   albuterol (VENTOLIN HFA) 108 (90 Base) MCG/ACT inhaler Inhale 2 puffs into the lungs every 6 (six) hours as needed for wheezing or shortness of breath.   aspirin EC 81 MG tablet Take 81 mg by mouth daily with lunch.    buPROPion (WELLBUTRIN XL) 300 MG 24 hr tablet Take 1 tablet (300 mg total) by mouth daily.   Cholecalciferol (DIALYVITE VITAMIN D 5000) 125 MCG (5000 UT) capsule Take 5,000 Units by mouth daily.   esomeprazole (NEXIUM) 40 MG capsule Take 1 capsule (40 mg total) by mouth daily.   ezetimibe (ZETIA) 10 MG tablet Take 1 tablet (10 mg total) by mouth  daily.   Fluticasone-Umeclidin-Vilant (TRELEGY ELLIPTA) 100-62.5-25 MCG/ACT AEPB Inhale 1 puff into the lungs daily.   Guaifenesin (MUCINEX MAXIMUM STRENGTH) 1200 MG TB12 Take 1,200 mg by mouth 2 (two) times daily.   levothyroxine (SYNTHROID) 88 MCG tablet Take 1 tablet (88 mcg total) by mouth daily.   Magnesium 500 MG TABS Take 500 mg by mouth daily with lunch.   Multiple Vitamins-Minerals (IMMUNE SUPPORT PO) Take 2 tablets by mouth daily.   Respiratory Therapy Supplies (FLUTTER) DEVI Use as directed   rosuvastatin (CRESTOR) 10 MG tablet Take 1 tablet (10 mg total) by mouth daily.   sodium chloride (OCEAN) 0.65 % SOLN nasal spray Place 1 spray into both nostrils daily as needed for congestion.   temazepam (RESTORIL) 15 MG capsule Take 1 capsule (15 mg total) by mouth at bedtime as needed.   UNABLE TO FIND Med Name: immunotherapy infusion once per month   vitamin B-12 (CYANOCOBALAMIN) 1000 MCG tablet Take 1,000 mcg by mouth daily.     Allergies:   Breztri aerosphere [budeson-glycopyrrol-formoterol], Benadryl [diphenhydramine], Codeine, Prednisone, and Statins   Social History   Socioeconomic History   Marital  status: Married    Spouse name: Not on file   Number of children: Not on file   Years of education: Not on file   Highest education level: Not on file  Occupational History   Not on file  Tobacco Use   Smoking status: Former    Packs/day: 1.75    Years: 44.00    Pack years: 77.00    Types: Cigarettes    Quit date: 11/09/2012    Years since quitting: 8.5   Smokeless tobacco: Never   Tobacco comments:    vapor  Vaping Use   Vaping Use: Former  Substance and Sexual Activity   Alcohol use: Not Currently    Alcohol/week: 24.0 standard drinks    Types: 24 Cans of beer per week   Drug use: Not Currently   Sexual activity: Not Currently  Other Topics Concern   Not on file  Social History Narrative   Not on file   Social Determinants of Health   Financial Resource  Strain: Medium Risk   Difficulty of Paying Living Expenses: Somewhat hard  Food Insecurity: No Food Insecurity   Worried About Charity fundraiser in the Last Year: Never true   Ran Out of Food in the Last Year: Never true  Transportation Needs: No Transportation Needs   Lack of Transportation (Medical): No   Lack of Transportation (Non-Medical): No  Physical Activity: Not on file  Stress: Stress Concern Present   Feeling of Stress : Rather much  Social Connections: Unknown   Frequency of Communication with Friends and Family: More than three times a week   Frequency of Social Gatherings with Friends and Family: More than three times a week   Attends Religious Services: More than 4 times per year   Active Member of [employer] or Organizations: Not on file   Attends Archivist Meetings: Not on file   Marital Status: Married     Family History: The patient's family history includes Asthma in an other family member; Congestive Heart Failure in her father and mother; Emphysema in her mother; Heart attack in her father and mother; Lung disease in her brother and sister. There is no history of Colon cancer.  ROS:   Review of Systems  Constitutional:  Positive for malaise/fatigue. Negative for diaphoresis and fever.  HENT:  Negative for hearing loss and nosebleeds.   Eyes:  Negative for pain and discharge.  Respiratory:  Negative for sputum production and shortness of breath.   Cardiovascular:  Negative for chest pain, palpitations, orthopnea, claudication, leg swelling and PND.  Gastrointestinal:  Negative for blood in stool and melena.  Genitourinary:  Negative for hematuria.  Musculoskeletal:  Negative for falls.  Neurological:  Negative for dizziness and loss of consciousness.  Endo/Heme/Allergies:  Negative for environmental allergies.  Psychiatric/Behavioral:  Negative for hallucinations and memory loss. The patient has insomnia.     EKGs/Labs/Other Studies Reviewed:     The following studies were reviewed today:  CTA Chest 03/14/2021: COMPARISON:  02/19/2021   FINDINGS: Cardiovascular: No filling defects in the pulmonary arteries to suggest pulmonary emboli. Heart is normal size. Aorta is normal caliber. Coronary artery and aortic calcifications.   Mediastinum/Nodes: No mediastinal, hilar, or axillary adenopathy. Material noted in the distal trachea thyroid unremarkable.   Lungs/Pleura: Improvement in aeration in the right upper lobe with re-expansion of the right upper lobe. Radiation changes noted medially within the right lung. Diffuse bronchial wall thickening on the right. Moderate emphysema. No effusions.  Upper Abdomen: Imaging into the upper abdomen demonstrates no acute findings.   Musculoskeletal: Chest wall soft tissues are unremarkable. No acute bony abnormality.   Review of the MIP images confirms the above findings.   IMPRESSION: No evidence of pulmonary embolus.   Near complete re-expansion of the right upper lobe. Emphysema, bronchial wall thickening again noted, unchanged.   Mucus/debris in the distal trachea.   Coronary artery disease.   Aortic Atherosclerosis (ICD10-I70.0) and Emphysema (ICD10-J43.9).  TTE 12/24/20: IMPRESSIONS    1. Left ventricular ejection fraction, by estimation, is 55 to 60%. Left  ventricular ejection fraction by 3D volume is 62 %. The left ventricle has  normal function. The left ventricle has no regional wall motion  abnormalities. Left ventricular diastolic   parameters are indeterminate. The average left ventricular global  longitudinal strain is -17.7 %. The global longitudinal strain is normal.   2. Right ventricular systolic function is normal. The right ventricular  size is normal. Tricuspid regurgitation signal is inadequate for assessing  PA pressure.   3. Left atrial size was mildly dilated.   4. The mitral valve is normal in structure. Trivial mitral valve  regurgitation.  No evidence of mitral stenosis.   5. The aortic valve is tricuspid. Aortic valve regurgitation is trivial.  No aortic stenosis is present.   6. Aortic dilatation noted. There is mild dilatation of the ascending  aorta, measuring 40 mm.   7. The inferior vena cava is normal in size with greater than 50%  respiratory variability, suggesting right atrial pressure of 3 mmHg.   Comparison(s): No significant change from prior study.   Coronary CTA 02/2020: FINDINGS: A 100 kV prospective scan was triggered in the descending thoracic aorta at 111 HU's. Axial non-contrast 3 mm slices were carried out through the heart. The data set was analyzed on a dedicated work station and scored using the [place]. Gantry rotation speed was 250 msecs and collimation was .6 mm. Metoprolol 100 mg PO and 0.8 mg of sl NTG was given. The 3D data set was reconstructed in 5% intervals of the 67-82 % of the R-R cycle. Diastolic phases were analyzed on a dedicated work station using MPR, MIP and VRT modes. The patient received 80 cc of contrast.   Aorta: Normal size. Mild diffuse atherosclerotic disease and calcifications. No dissection.   Aortic Valve:  Trileaflet.  No calcifications.   Coronary Arteries:  Normal coronary origin.  Left dominance.   RCA is a small non-dominant artery with luminal irregularities.   Left main is a large artery that gives rise to LAD and LCX arteries. Distal left main has mild to moderate eccentric calcified plaque with stenosis 25-49%.   LAD is a large vessel that gives rise to two diagonal arteries. There is mild calcified plaque in the proximal LAD with stenosis 25-49%. Mid and distal LAD has minimal plaque.   D1,2 have only minimal irregularities.   LCX is a large dominant artery that gives rise to PDA and PLA. There is mild calcified plaque in the proximal and mid LCX artery with stenosis 25-49%, otherwise only minimal non-calcified plaque.   Other findings:    Normal pulmonary vein drainage into the left atrium.   Normal left atrial appendage without a thrombus.   Normal size of the pulmonary artery.   IMPRESSION: 1. Coronary calcium score of 261. This was 25 percentile for age and sex matched control.   2. Normal coronary origin with left dominance.   3. CAD-RADS  2. Mild non-obstructive CAD (25-49%) in the distal left main and proximal portions of LAD and LCX arteries. Consider non-atherosclerotic causes of dyspnea. Consider preventive therapy and risk factor modification.  Transthoracic Echocardiogram: Date: 03/11/2020 Results: 1. Left ventricular ejection fraction, by estimation, is 55 to 60%. The  left ventricle has normal function. The left ventricle has no regional  wall motion abnormalities. Left ventricular diastolic parameters are  consistent with Grade I diastolic  dysfunction (impaired relaxation).   2. Right ventricular systolic function is normal. The right ventricular  size is normal. Tricuspid regurgitation signal is inadequate for assessing  PA pressure.   3. The mitral valve is normal in structure. Trivial mitral valve  regurgitation. No evidence of mitral stenosis.   4. The aortic valve is tricuspid. Aortic valve regurgitation is trivial.  No aortic stenosis is present.   5. Aortic dilatation noted. There is mild dilatation of the ascending  aorta, measuring 41 mm.   6. The inferior vena cava is normal in size with greater than 50%  respiratory variability, suggesting right atrial pressure of 3 mmHg.    NonCardiac CT: Date: 09/21/20 Results: IMPRESSION: 1. Subcarinal lymphadenopathy, increased since 06/19/2020 CT and new since 12/11/2019 CT, concerning for metastatic disease. Persistent branching tubular opacity in the anterior central right upper lobe associated with abrupt cut off of a segmental anterior right upper lobe bronchus, not substantially changed since 06/19/2020 CT. Underlying endobronchial  malignancy not excluded. Multidisciplinary thoracic oncology consultation suggested. PET-CT recommended for further characterization. 2. Severe centrilobular emphysema with diffuse bronchial wall thickening, compatible with the provided history of COPD. 3. Left main and two-vessel coronary atherosclerosis. 4. Aortic Atherosclerosis (ICD10-I70.0) and Emphysema (ICD10-J43.9). 5. Mild TAA 41 mm   NM Stress Testing: Date: 06/28/2016 Results: Nuclear stress EF: 58%. Blood pressure demonstrated a normal response to exercise. There was no ST segment deviation noted during stress. Defect 1: There is a small defect of moderate severity present in the apical anterior and apex location. The study is normal. This is a low risk study. The left ventricular ejection fraction is normal (55-65%).   Normal stress nuclear study with breast attenuation but no ischemia; EF 58 with normal wall motion.   Left/Right Heart Catheterizations: Date: 11/07/2017 Results: Conclusions: Eccentric calcification with mild stenosis of the ostial LAD (~20%).  Otherwise, no angiographically significant coronary artery disease. Normal left ventricular systolic function and filling pressure.   Recommendations: Continue medical therapy and risk factor modification to prevent progression of CAD. Monitor overnight to ensure the patient does not have recurrent chest pain.  Anticipate discharge home tomorrow morning.   Recommend Aspirin 35m daily for mild CAD and thoracic aortic aneurysm.   EKG:  EKG is personally reviewed.  06/04/2021: EKG was not ordered. 03/14/2021 (ED): Sinus rhythm. Rate 99 bpm. Anterior infarct, old.  Recent Labs: 09/22/2020: Magnesium 2.2 06/03/2021: ALT 25; BUN 11; Creatinine 0.76; Hemoglobin 11.6; Platelet Count 155; Potassium 3.9; Sodium 137; TSH 8.563   Recent Lipid Panel    Component Value Date/Time   CHOL 163 03/04/2021 0731   TRIG 122 03/04/2021 0731   HDL 50 03/04/2021 0731   CHOLHDL 3.3  03/04/2021 0731   CHOLHDL 3.4 02/21/2018 0845   VLDL 38 (H) 12/30/2015 1536   LDLCALC 91 03/04/2021 0731   LDLCALC 83 02/21/2018 0845     Risk Assessment/Calculations:           Physical Exam:    VS:  BP (!) 148/84    Pulse 79  Ht _0  (1.626 m)    Wt 131 lb 12.8 oz (59.8 kg)    SpO2 99%    BMI 22.62 kg/m     Wt Readings from Last 3 Encounters:  06/04/21 131 lb 12.8 oz (59.8 kg)  06/03/21 131 lb 6.4 oz (59.6 kg)  05/07/21 132 lb 9.6 oz (60.1 kg)     GEN: Well nourished, well developed in no acute distress HEENT: Normal NECK: No JVD; No carotid bruits LYMPHATICS: No lymphadenopathy CARDIAC: RRR, no murmurs, rubs, gallops RESPIRATORY:  Clear to auscultation without rales, wheezing or rhonchi  ABDOMEN: Soft, non-tender, non-distended MUSCULOSKELETAL:  No edema; No deformity  SKIN: Warm and dry NEUROLOGIC:  Alert and oriented x 3 PSYCHIATRIC:  Normal affect   ASSESSMENT:    1. Coronary artery disease involving native coronary artery of native heart without angina pectoris   2. Hyperlipidemia, unspecified hyperlipidemia type   3. Mixed hyperlipidemia   4. Medication management   5. History of chemotherapy   6. Aneurysm of ascending aorta without rupture   7. Pure hypertriglyceridemia    PLAN:    In order of problems listed above:  #Mild Nonobstructive CAD: Patient with mild disease on cath in 2019 and mild multivessel disease on coronary CTA in 2021. Ca score 261 (90%) currently on medical management. No anginal symptoms. TTE with normal BiV function. -Continue crestor 70m, zetia 141mdaily (will do trial of crestor 2076maily and see if she tolerates) -Check lipid panel  -Continue ASA 57m31mily  #NSCLC on chemo/XRT and immunotherapy: Stage IIIA non small cell lung cancer diagnosed in 10/2020. Received carboplatin and paclitaxel. Also on immunotherapy with Imfinzi. Followed by Dr. MohaJulien NordmannE with LVEF 55-60% with low normal strain -17.7%. Will need continued  surveillance. -Follow-up with onc as scheduled -Will continue monitoring with TTE with strain with next in 6mon16monthAA: Stable on most recent TTE measuring 40mm.16montinue serial monitor with serial imaging (can use CT scan obtained by Onc as well)  #HLD: Had difficulty with crestor 20mg i56me past but would like to retrial. If does not tolerate, will continue current regimen of crestor 10 and zetia 10mg da32m -Trial of crestor 20mg dai31mIf fails, can go back to crestor 10mg and 38ma 10mg daily36meck lipid panel today      Follow-up:   6 months.  Medication Adjustments/Labs and Tests Ordered: Current medicines are reviewed at length with the patient today.  Concerns regarding medicines are outlined above.   Orders Placed This Encounter  Procedures   Lipid Profile   ECHOCARDIOGRAM COMPLETE   No orders of the defined types were placed in this encounter.  Patient Instructions  Medication Instructions:   START TAKING CRESTOR AS A TRIAL AND MYCHART US BACK WITKoreaYOUR TOLERANCE LEVEL TO THIS MEDICATION, SO WE CAN SEND THIS INTO YOUR PHARMACY  *If you need a refill on your cardiac medications before your next appointment, please call your pharmacy*   Lab Work:  TODAY--LIPIDS  If you have labs (blood work) drawn today and your tests are completely normal, you will receive your results only by: MyChart [facility] MyChart) OR A paper copy in the mail If you have any lab test that is abnormal or we need to change your treatment, we will call you to review the results.   Testing/Procedures:  Your physician has requested that you have an echocardiogram. Echocardiography is a painless test that uses sound waves to create images of your  heart. It provides your doctor with information about the size and shape of your heart and how well your hearts chambers and valves are working. This procedure takes approximately one hour. There are no restrictions for this  procedure.  SCHEDULE TO HAVE DONE IN 6 MONTHS AND DO ECHO WITH STRAIN PER DR. Johney Frame   Follow-Up: At [facility], you and your health needs are our priority.  As part of our continuing mission to provide you with exceptional heart care, we have created designated Provider Care Teams.  These Care Teams include your primary Cardiologist (physician) and Advanced Practice Providers (APPs -  Physician Assistants and Nurse Practitioners) who all work together to provide you with the care you need, when you need it.  We recommend signing up for the patient portal called "MyChart".  Sign up information is provided on this After Visit Summary.  MyChart is used to connect with patients for Virtual Visits (Telemedicine).  Patients are able to view lab/test results, encounter notes, upcoming appointments, etc.  Non-urgent messages can be sent to your provider as well.   To learn more about what you can do with MyChart, go to [email or web address].    Your next appointment:   6 month(s)  The format for your next appointment:   In Person  Provider:   DR. Delmar Landau Stumpf,acting as a scribe for Freada Bergeron, MD.,have documented all relevant documentation on the behalf of Freada Bergeron, MD,as directed by  Freada Bergeron, MD while in the presence of Freada Bergeron, MD.  I, Freada Bergeron, MD, have reviewed all documentation for this visit. The documentation on 06/04/21 for the exam, diagnosis, procedures, and orders are all accurate and complete.   Signed, Freada Bergeron, MD  06/04/2021 9:10 AM    [place]

## 2021-06-15 LAB — SUSCEPT AFB, RAPID REFLEX
Amikacin: <=1 ug/mL
CLOFAZIMINE: 0.12 ug/mL
Ceoxitin: 32 ug/mL
Ciprofloxacin: 0.5 ug/mL
Clarithromycin: >16 ug/mL
Doxycycline: >8 ug/mL
Imipenem: 8 ug/mL
Linezolid: 8 ug/mL
Moxifloxxacin: 0.25 ug/mL
Tigecycline: 0.12 ug/mL

## 2021-06-15 LAB — MYCOBACTERIA,CULT W/FLUOROCHROME SMEAR
MICRO NUMBER:: 12811830
SMEAR:: NONE SEEN
SPECIMEN QUALITY:: ADEQUATE

## 2021-06-15 LAB — MYCOBACTERIAL IDENTIFICATION AND SUSCEPTIBILITY

## 2021-06-17 ENCOUNTER — Telehealth: Payer: Self-pay | Admitting: Medical

## 2021-06-17 NOTE — Telephone Encounter (Signed)
Pt called and states that since her synthroid dose was increased she has been experiencing side effects. She has woke up with a headache every morning. It goes away but every morning since increase. She also states that her fatigue is much worse. Pt would like to come in for thyroid blood work to see if its helping and she can decrease or change. Please advise pt at [phone].   ?

## 2021-06-18 ENCOUNTER — Other Ambulatory Visit: Payer: Self-pay | Admitting: Medical

## 2021-06-18 ENCOUNTER — Telehealth: Payer: Self-pay

## 2021-06-18 DIAGNOSIS — R7989 Other specified abnormal findings of blood chemistry: Secondary | ICD-10-CM

## 2021-06-18 NOTE — Telephone Encounter (Signed)
Patient aware of results. Patient scheduled to see Dr.Singh on 3/21 at 11 AM. ? ? ?[place] P Lurline Caver, CMA ? ?

## 2021-06-18 NOTE — Telephone Encounter (Signed)
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 Please schedule her with one of the providers next week for treatment planning. I am on consult next week. She has seen [name] before if she would like to see him. Otherwise ok to schedule with another provider in 30 minutes slot.  ?----- Message ----- ?From: Interface, [facility] Results In ?Sent: 05/12/2021   2:03 PM EDT ?To: Rosiland Oz, MD ? ?

## 2021-06-21 NOTE — Telephone Encounter (Signed)
Pt was called and she states that she has an appt with infectious disease tomorrow. She will ask if they can draw blood there if not she will come by tomorrow.  ?

## 2021-06-22 ENCOUNTER — Encounter: Payer: Self-pay | Admitting: Internal Medicine

## 2021-06-22 ENCOUNTER — Other Ambulatory Visit: Payer: Medicare Other

## 2021-06-22 ENCOUNTER — Other Ambulatory Visit: Payer: Self-pay

## 2021-06-22 ENCOUNTER — Ambulatory Visit (INDEPENDENT_AMBULATORY_CARE_PROVIDER_SITE_OTHER): Payer: Medicare Other | Admitting: Internal Medicine

## 2021-06-22 VITALS — BP 152/83 | HR 72 | Temp 97.5°F | Wt 131.0 lb

## 2021-06-22 DIAGNOSIS — A310 Pulmonary mycobacterial infection: Secondary | ICD-10-CM | POA: Diagnosis present

## 2021-06-22 DIAGNOSIS — R7989 Other specified abnormal findings of blood chemistry: Secondary | ICD-10-CM

## 2021-06-22 NOTE — Addendum Note (Signed)
Addended byLaurice Record on: 06/22/2021 02:22 PM ? ? Modules accepted: Level of Service ? ?

## 2021-06-22 NOTE — Progress Notes (Addendum)
? ?   ? ? ? ? ?Patient Active Problem List  ? Diagnosis Date Noted  ? Hypothyroidism 05/07/2021  ? Dysphagia 02/12/2021  ? Gastroesophageal reflux disease 02/12/2021  ? Encounter for antineoplastic immunotherapy 02/09/2021  ? 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 Stage III squamous cell carcinoma of right lung ([place]) 10/19/2020  ? Encounter for antineoplastic chemotherapy 10/19/2020  ? Adenopathy 10/01/2020  ? Leg cramp 09/22/2020  ? Deficiency of other specified B group vitamins  09/22/2020  ? Fatigue 09/22/2020  ? B12 deficiency 09/22/2020  ? Vitamin deficiency 09/22/2020  ? Abnormal thyroid blood test 09/22/2020  ? Abnormal thyroid exam 09/22/2020  ? Other specified disorders of thyroid  09/22/2020  ? Myalgia due to statin 03/09/2020  ? Anxiety 01/12/2020  ? Hyperlipidemia 01/12/2020  ? Labile hypertension 01/12/2020  ? Acute coronary syndrome ([place]) 01/12/2020  ? Dental staining 07/15/2019  ? Bronchiectasis with acute exacerbation ([place]) 04/17/2019  ? Stage 2 moderate COPD by GOLD classification ([place]) 04/17/2019  ? Cough   ? Chronic insomnia 01/07/2019  ? MAI (mycobacterium avium-intracellulare) ([place]) 10/24/2018  ? 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 Chest pain 05/29/2018  ? Pulmonary nodules 02/06/2018  ? CAD (coronary artery disease) 11/21/2017  ? Swelling of joint of upper arm, right 11/14/2017  ? Swelling of extremity, right 11/14/2017  ? Redness of forearm 11/14/2017  ? Ischemic chest pain ([place]) 11/07/2017  ? COPD with acute exacerbation ([place]) 03/09/2017  ? Dysthymia 03/09/2017  ? DOE (dyspnea on exertion) 06/23/2016  ? Vitamin D deficiency 12/30/2015  ? Thoracic aortic aneurysm 12/30/2015  ? Atherosclerosis of aorta ([place]) 12/30/2015  ? Osteopenia 12/30/2015  ? Abnormal CT of the chest 02/10/2015  ? COPD (chronic obstructive pulmonary disease) ([place])   ? ? ?Patient's Medications  ?New Prescriptions  ? No medications on file  ?Previous Medications  ? ACETAMINOPHEN  (TYLENOL) 500 MG TABLET    Take 500 mg by mouth every 6 (six) hours as needed for moderate pain or headache.  ? ALBUTEROL (PROVENTIL) (2.5 MG/3ML) 0.083% NEBULIZER SOLUTION    Take 3 mLs (2.5 mg total) by nebulization every 6 (six) hours as needed for wheezing or shortness of breath.  ? ALBUTEROL (VENTOLIN HFA) 108 (90 BASE) MCG/ACT INHALER    Inhale 2 puffs into the lungs every 6 (six) hours as needed for wheezing or shortness of breath.  ? ASPIRIN EC 81 MG TABLET    Take 81 mg by mouth daily with lunch.   ? BUPROPION (WELLBUTRIN XL) 300 MG 24 HR TABLET    Take 1 tablet (300 mg total) by mouth daily.  ? CHOLECALCIFEROL (DIALYVITE VITAMIN D 5000) 125 MCG (5000 UT) CAPSULE    Take 5,000 Units by mouth daily.  ? ESOMEPRAZOLE (NEXIUM) 40 MG CAPSULE    Take 1 capsule (40 mg total) by mouth daily.  ? EZETIMIBE (ZETIA) 10 MG TABLET    Take 1 tablet (10 mg total) by mouth daily.  ? FLUTICASONE-UMECLIDIN-VILANT (TRELEGY ELLIPTA) 100-62.5-25 MCG/ACT AEPB    Inhale 1 puff into the lungs daily.  ? GUAIFENESIN (MUCINEX MAXIMUM STRENGTH) 1200 MG TB12    Take 1,200 mg by mouth 2 (two) times daily.  ? LEVOTHYROXINE (SYNTHROID) 88 MCG TABLET    Take 1 tablet (88 mcg total) by mouth daily.  ? MAGNESIUM 500 MG TABS    Take 500 mg by mouth daily with lunch.  ? MULTIPLE VITAMINS-MINERALS (IMMUNE SUPPORT PO)    Take 2 tablets by mouth  daily.  ? RESPIRATORY THERAPY SUPPLIES (FLUTTER) DEVI    Use as directed  ? ROSUVASTATIN (CRESTOR) 10 MG TABLET    Take 1 tablet (10 mg total) by mouth daily.  ? SODIUM CHLORIDE (OCEAN) 0.65 % SOLN NASAL SPRAY    Place 1 spray into both nostrils daily as needed for congestion.  ? TEMAZEPAM (RESTORIL) 15 MG CAPSULE    Take 1 capsule (15 mg total) by mouth at bedtime as needed.  ? UNABLE TO FIND    Med Name: immunotherapy infusion once per month  ? VITAMIN B-12 (CYANOCOBALAMIN) 1000 MCG TABLET    Take 1,000 mcg by mouth daily.  ?Modified Medications  ? No medications on file  ?Discontinued Medications  ? No  medications on file  ? ? ?Subjective: ?86 YF with PMHx as below present for follow-up of pulmonary MAC. She has HX fo COPD, Sqm Cell lung Ca on immunotherpay, Pulmonary MAC infection  off tx since Nov 2021), EX smoker, HTN, HLD, MI referred to [place] for BAL cultures 01/14/21 growing MAC again. 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  ? ? ? ?Last seen by Dr. Alphonsa Gin on 02/23/22, HPI noted: ?"Initially seen by [name] in 09/2018 when she was treated with a 10 day course of bactrim for sputum cx growing Stenotrophomonas maltophilia. She was having chronic cough at that time with other possible consideration for COPD, lung nodules and ? GERD for her cough. BAL 01/29/2019 AFB smear negative but cx grew MAC. She was eventually started on triple therapy three times a week Azithromycin 517m, Rifampin 6049mand ethambutol 160024mon 02/25/2019,. She was changed from three times a week to daily regimen on 04/15/2019 given no significant improvement in cough which reportedly improved the cough.  Treatment course complicated by dental staining with Rifampin  which was stopped around 07/2019 with a plan to switch to clofazimine. Patient did not want to take clofazimine and continued taking Rifampin and eventually switched to Clofazimine on 11/26/2019. Cough had become worse since switch to clofazimine and patient was referred to Dr StoLorenda Cahillr 2nd opinion. Seen by Dr StoLorenda Cahill 01/13/20. Dr StoLorenda Cahills not entirely convinced about Pulmonary MAC infection and stopped clofazimine, resumed rifampin, decreased dose of azithromycin from 500m38m daily to 250mg17mdaily and continued on ethambutol. Also referred to pulmonary rehab and gave additional cups for sputum AFB cultures. She was last seen by dr Van DTommy Medal/24/22 when she had improvement in cough but chronic nausea. She had already stopped taking anti mycobacterial abtx at that time ( she tells me she stopped in Nov 2021). It was planned to monitor off tx at that  visit. Patient wants to see a different provider this time.  ?  ?Unfortunately, she was diagnosed with squamous cell lung ca in July 2022 s/p chemoradiation and is undergoing immunotherapy with Dr MohamJulien Nordmannre is a plan for repeat CT chest after # cycle 3 and possible consideration for systemic chemotherapy and radiation if disease progression or no disease improvement ?  ?She has worsening SOB for the last 2 weeks. Feels like there is a lemon inside the throat and she is having hard time breathing. She can't take a deep breath. Prior to 2 weeks, she was able to walk in the tread mill for 30 minutes without any issues on a regular basis. She denies coughing and denies producing phlegm. Denies chest pain. She thinks her SOB has acutely changed in the last 2 weeks and  her breathing was relatively stable prior to that. At home, her husband does cooking, she helps with dishes and cleaning house but has to take small breaks in between. She does not walk much/climb stairs.  ?  ?Denies fevers, chills and sweats. Appetite is good and tells she has gained 4 lbs.  ?Denies nausea, vomiting, abdominal pain and diarrhea ?Denies any GU symptoms ?Denies any headache and blurry vision.  ?  ?Ex , smoker, quit in 2014. Used to smoke 1.5 pack of cigarettes for 35 years. Used to drink alcohol socially which she has quit 6 years ago. Denies IVDU. Lives with her husband. She used to do an office work previously. She used to have dog before, denies contact with farm animals. Denies travel out of the country and has not traveled any where that she can think of except to beaches." ?  ?  ?Today 06/22/21: She reports since last visit, she has had no change in symptoms. She continues on immunotherapy for lung cancer. She denies SHOB, worsening cough, fever, chills  ? ?Microbiology Hx: ?05/10/21 Sputum mycobacterium smear negative with Cx+ partially acid-fast organism. Fungal isolate:nocardia asteroides complex ?04/01/21 Sputum  Mycobacterial  sputum, smear negative with  Cx 1/2+ Mycobacterium porcinum ?01/14/21  BAL AFB smear negative, Cx grew MAC; 40,000 colonies Neisseria sicca ?07/06/20 sputum  Hemophilus parainfluenzae ?11/26/19 Sputum cultu

## 2021-06-23 ENCOUNTER — Other Ambulatory Visit: Payer: Self-pay | Admitting: Medical

## 2021-06-23 ENCOUNTER — Other Ambulatory Visit: Payer: Self-pay

## 2021-06-23 ENCOUNTER — Other Ambulatory Visit: Payer: Medicare Other

## 2021-06-23 DIAGNOSIS — A310 Pulmonary mycobacterial infection: Secondary | ICD-10-CM

## 2021-06-23 LAB — TSH+FREE T4
Free T4: 1.34 ng/dL (ref 0.82–1.77)
TSH: 7.66 u[IU]/mL — ABNORMAL HIGH (ref 0.450–4.500)

## 2021-06-23 MED ORDER — THYROID 60 MG PO TABS: 60.0000 mg | ORAL_TABLET | Freq: Every day | ORAL | 1 refills | Status: DC

## 2021-06-24 ENCOUNTER — Other Ambulatory Visit: Payer: Medicare Other

## 2021-06-24 ENCOUNTER — Other Ambulatory Visit: Payer: Self-pay | Admitting: Medical

## 2021-06-24 ENCOUNTER — Ambulatory Visit: Payer: Medicare Other | Admitting: Infectious Diseases

## 2021-06-24 ENCOUNTER — Other Ambulatory Visit: Payer: Self-pay

## 2021-06-24 DIAGNOSIS — A310 Pulmonary mycobacterial infection: Secondary | ICD-10-CM

## 2021-06-24 MED ORDER — LEVOTHYROXINE SODIUM 88 MCG PO TABS: 88.0000 ug | ORAL_TABLET | Freq: Every day | ORAL | 0 refills | Status: DC

## 2021-06-24 NOTE — Addendum Note (Signed)
Addended by: Eugenia Mcalpine on: 06/24/2021 11:14 AM ? ? Modules accepted: Orders ? ?

## 2021-06-28 ENCOUNTER — Other Ambulatory Visit: Payer: Medicare Other

## 2021-06-28 ENCOUNTER — Encounter: Payer: Self-pay | Admitting: Internal Medicine

## 2021-06-28 ENCOUNTER — Other Ambulatory Visit: Payer: Self-pay

## 2021-06-28 DIAGNOSIS — A310 Pulmonary mycobacterial infection: Secondary | ICD-10-CM

## 2021-06-28 NOTE — Addendum Note (Signed)
Addended by: Tomi Bamberger on: 06/28/2021 10:56 AM ? ? Modules accepted: Orders ? ?

## 2021-06-29 ENCOUNTER — Other Ambulatory Visit: Payer: Self-pay | Admitting: Medical

## 2021-06-29 ENCOUNTER — Encounter: Payer: Self-pay | Admitting: Internal Medicine

## 2021-06-29 ENCOUNTER — Other Ambulatory Visit: Payer: Self-pay

## 2021-06-29 ENCOUNTER — Ambulatory Visit
Admission: RE | Admit: 2021-06-29 | Discharge: 2021-06-29 | Disposition: A | Discharge status: Home / Self Care | Payer: Medicare Other | Source: Ambulatory Visit | Attending: Physician Assistant | Admitting: Physician Assistant

## 2021-06-29 DIAGNOSIS — C3491 Malignant neoplasm of unspecified part of right bronchus or lung: Secondary | ICD-10-CM

## 2021-06-29 MED ORDER — THYROID 60 MG PO TABS: 60.0000 mg | ORAL_TABLET | Freq: Every day | ORAL | 0 refills | Status: DC

## 2021-06-29 MED ORDER — IOPAMIDOL (ISOVUE-300) INJECTION 61%
75.0000 mL | Freq: Once | INTRAVENOUS | Status: AC | PRN
  Administered 2021-06-29: 75 mL via INTRAVENOUS

## 2021-07-01 ENCOUNTER — Other Ambulatory Visit: Payer: Self-pay

## 2021-07-01 ENCOUNTER — Inpatient Hospital Stay (HOSPITAL_BASED_OUTPATIENT_CLINIC_OR_DEPARTMENT_OTHER): Payer: Medicare Other | Admitting: Internal Medicine

## 2021-07-01 ENCOUNTER — Inpatient Hospital Stay: Payer: Medicare Other

## 2021-07-01 ENCOUNTER — Other Ambulatory Visit: Payer: Self-pay | Admitting: Internal Medicine

## 2021-07-01 VITALS — BP 160/73 | HR 73 | Temp 98.1°F | Wt 130.0 lb

## 2021-07-01 DIAGNOSIS — Z5112 Encounter for antineoplastic immunotherapy: Secondary | ICD-10-CM

## 2021-07-01 DIAGNOSIS — C3491 Malignant neoplasm of unspecified part of right bronchus or lung: Secondary | ICD-10-CM

## 2021-07-01 DIAGNOSIS — R5382 Chronic fatigue, unspecified: Secondary | ICD-10-CM

## 2021-07-01 LAB — ANTIMICROBIAL SUSCEPT, NOCARDIA/AEROB ACTINOMYCETES MIC
AMIKACIN: <=0.5 ug/mL
CEFTRIAXONE: 8 ug/mL
CIPROFLOXACIN: 8 ug/mL
CLARITHROMYCIN: <=0.03 ug/mL
DOXYCYCLINE: 4 ug/mL
IMIOENEM: 0.25 ug/mL
LINEZOLID: 1 ug/mL
MINOCYCLINE: 2 ug/mL
MOXIFLOXACIN: 2 ug/mL
TOBRANYCIN: 8 ug/mL

## 2021-07-01 LAB — CMP (CANCER CENTER ONLY)
ALT: 22 U/L (ref 0–44)
AST: 27 U/L (ref 15–41)
Albumin: 4.1 g/dL (ref 3.5–5.0)
Alkaline Phosphatase: 95 U/L (ref 38–126)
Anion gap: 7 (ref 5–15)
BUN: 12 mg/dL (ref 8–23)
CO2: 28 mmol/L (ref 22–32)
Calcium: 9.1 mg/dL (ref 8.9–10.3)
Chloride: 107 mmol/L (ref 98–111)
Creatinine: 0.69 mg/dL (ref 0.44–1.00)
GFR, Estimated: >60 mL/min (ref >60)
Glucose, Bld: 83 mg/dL (ref 70–99)
Potassium: 3.6 mmol/L (ref 3.5–5.1)
Sodium: 142 mmol/L (ref 135–145)
Total Bilirubin: 0.4 mg/dL (ref 0.3–1.2)
Total Protein: 7 g/dL (ref 6.5–8.1)

## 2021-07-01 LAB — CBC WITH DIFFERENTIAL (CANCER CENTER ONLY)
Abs Immature Granulocytes: 0.02 10*3/uL (ref 0.00–0.07)
Basophils Absolute: 0 10*3/uL (ref 0.0–0.1)
Basophils Relative: 0 %
Eosinophils Absolute: 0.1 10*3/uL (ref 0.0–0.5)
Eosinophils Relative: 1 %
HCT: 36.8 % (ref 36.0–46.0)
Hemoglobin: 11.8 g/dL — ABNORMAL LOW (ref 12.0–15.0)
Immature Granulocytes: 0 %
Lymphocytes Relative: 15 %
Lymphs Abs: 1 10*3/uL (ref 0.7–4.0)
MCH: 26.9 pg (ref 26.0–34.0)
MCHC: 32.1 g/dL (ref 30.0–36.0)
MCV: 84 fL (ref 80.0–100.0)
Monocytes Absolute: 0.4 10*3/uL (ref 0.1–1.0)
Monocytes Relative: 6 %
Neutro Abs: 5.1 10*3/uL (ref 1.7–7.7)
Neutrophils Relative %: 78 %
Platelet Count: 149 10*3/uL — ABNORMAL LOW (ref 150–400)
RBC: 4.38 MIL/uL (ref 3.87–5.11)
RDW: 14.9 % (ref 11.5–15.5)
WBC Count: 6.6 10*3/uL (ref 4.0–10.5)
nRBC: 0 % (ref 0.0–0.2)

## 2021-07-01 LAB — MYCOBACTERIA,CULT W/FLUOROCHROME SMEAR
MICRO NUMBER:: 12971803
SMEAR:: NONE SEEN
SPECIMEN QUALITY:: ADEQUATE

## 2021-07-01 LAB — FUNGAL ISOLATE IDENTIFICATION
Micro Number:: 13042393
Specimen Quality:: ADEQUATE

## 2021-07-01 LAB — TEST AUTHORIZATION: TEST CODE:: 390

## 2021-07-01 LAB — TSH: TSH: 4.598 u[IU]/mL — ABNORMAL HIGH (ref 0.308–3.960)

## 2021-07-01 MED ORDER — SODIUM CHLORIDE 0.9 % IV SOLN
1500.0000 mg | Freq: Once | INTRAVENOUS | Status: AC
  Administered 2021-07-01: 1500 mg via INTRAVENOUS
  Filled 2021-07-01: qty 30

## 2021-07-01 MED ORDER — SODIUM CHLORIDE 0.9 % IV SOLN: Freq: Once | INTRAVENOUS | Status: AC

## 2021-07-01 NOTE — Patient Instructions (Signed)
[facility]  Discharge Instructions: °Thank you for choosing [facility] to provide your oncology and hematology care.  ° °If you have a lab appointment with the Cancer Center, please go directly to the Cancer Center and check in at the registration area. °  °Wear comfortable clothing and clothing appropriate for easy access to any Portacath or PICC line.  ° °We strive to give you quality time with your provider. You may need to reschedule your appointment if you arrive late (15 or more minutes).  Arriving late affects you and other patients whose appointments are after yours.  Also, if you miss three or more appointments without notifying the office, you may be dismissed from the clinic at the provider’s discretion.    °  °For prescription refill requests, have your pharmacy contact our office and allow 72 hours for refills to be completed.   ° °Today you received the following chemotherapy and/or immunotherapy agents :  Durvalumab °    °  °To help prevent nausea and vomiting after your treatment, we encourage you to take your nausea medication as directed. ° °BELOW ARE SYMPTOMS THAT SHOULD BE REPORTED IMMEDIATELY: °*FEVER GREATER THAN 100.4 F (38 °C) OR HIGHER °*CHILLS OR SWEATING °*NAUSEA AND VOMITING THAT IS NOT CONTROLLED WITH YOUR NAUSEA MEDICATION °*UNUSUAL SHORTNESS OF BREATH °*UNUSUAL BRUISING OR BLEEDING °*URINARY PROBLEMS (pain or burning when urinating, or frequent urination) °*BOWEL PROBLEMS (unusual diarrhea, constipation, pain near the anus) °TENDERNESS IN MOUTH AND THROAT WITH OR WITHOUT PRESENCE OF ULCERS (sore throat, sores in mouth, or a toothache) °UNUSUAL RASH, SWELLING OR PAIN  °UNUSUAL VAGINAL DISCHARGE OR ITCHING  ° °Items with * indicate a potential emergency and should be followed up as soon as possible or go to the Emergency Department if any problems should occur. ° °Please show the CHEMOTHERAPY ALERT CARD or IMMUNOTHERAPY ALERT CARD at  check-in to the Emergency Department and triage nurse. ° °Should you have questions after your visit or need to cancel or reschedule your appointment, please contact [facility]  Dept: [phone]  and follow the prompts.  Office hours are 8:00 a.m. to 4:30 p.m. Monday - Friday. Please note that voicemails left after 4:00 p.m. may not be returned until the following business day.  We are closed weekends and major holidays. You have access to a nurse at all times for urgent questions. Please call the main number to the clinic Dept: [phone] and follow the prompts. ° ° °For any non-urgent questions, you may also contact your provider using MyChart. We now offer e-Visits for anyone 18 and older to request care online for non-urgent symptoms. For details visit [email or web address]. °  °Also download the MyChart app! Go to the app store, search "MyChart", open the app, select [facility], and log in with your MyChart username and password. ° °Due to Covid, a mask is required upon entering the hospital/clinic. If you do not have a mask, one will be given to you upon arrival. For doctor visits, patients may have 1 support person aged 18 or older with them. For treatment visits, patients cannot have anyone with them due to current Covid guidelines and our immunocompromised population.  ° °

## 2021-07-01 NOTE — Progress Notes (Signed)
?    [facility] ?[phone]   [phone] ? ?OFFICE PROGRESS NOTE ? ?Tysinger, Camelia Eng, PA-C ?[address] ?[place] [address] ? ?DIAGNOSIS: Stage IIIA ([place], N2, M0) non-small cell lung cancer, squamous cell carcinoma presented with subcarinal mass/lymphadenopathy diagnosed in July 2022. ?  ?PRIOR THERAPY: Weekly carboplatin for AUC of 2 and paclitaxel 45 mg/m2.  First dose expected on 11/02/2020.  Status post 7 cycles.  Last dose was given 12/14/2020 ?  ?CURRENT THERAPY: Consolidation treatment with immunotherapy with Imfinzi 1500 Mg IV every 4 weeks.  First dose 01/14/2021.  Status post 6 cycles ? ?INTERVAL HISTORY: ?Katrina Campbell 66 y.o. female returns to the clinic today for follow-up visit.  The patient is feeling fine today with no concerning complaints except for mild cough.  She denied having any chest pain, shortness of breath or hemoptysis.  She denied having any nausea, vomiting, diarrhea or constipation.  She has no headache or visual changes.  She has no recent weight loss or night sweats.  She has been tolerating her treatment with Imfinzi fairly well.  The patient was seen by Dr. Candiss Norse from infectious disease and her last culture showed nocardia but it is thought to be a contamination and the patient did not start any treatment. ?She had repeat CT scan of the chest performed recently and she is here for evaluation and discussion of her scan results. ? ?MEDICAL HISTORY: ?Past Medical History:  ?Diagnosis Date  ? Anxiety   ? COPD (chronic obstructive pulmonary disease) ([place])   ? Coronary artery disease   ? Dental staining 07/15/2019  ? Depression   ? History of radiation therapy   ? Right lung- 11/03/20-12/15/20- Dr. Gery Pray  ? Hyperlipidemia   ? Labile hypertension   ? Myocardial infarction [facility]) 2018  ? Pneumonia   ? Pulmonary mycobacterial infection ([place]) 10/24/2018  ? ? ?ALLERGIES:  is allergic to breztri aerosphere [budeson-glycopyrrol-formoterol],  benadryl [diphenhydramine], codeine, prednisone, and statins. ? ?MEDICATIONS:  ?Current Outpatient Medications  ?Medication Sig Dispense Refill  ? acetaminophen (TYLENOL) 500 MG tablet Take 500 mg by mouth every 6 (six) hours as needed for moderate pain or headache.    ? albuterol (PROVENTIL) (2.5 MG/3ML) 0.083% nebulizer solution Take 3 mLs (2.5 mg total) by nebulization every 6 (six) hours as needed for wheezing or shortness of breath. 540 mL 3  ? albuterol (VENTOLIN HFA) 108 (90 Base) MCG/ACT inhaler Inhale 2 puffs into the lungs every 6 (six) hours as needed for wheezing or shortness of breath.    ? aspirin EC 81 MG tablet Take 81 mg by mouth daily with lunch.     ? buPROPion (WELLBUTRIN XL) 300 MG 24 hr tablet Take 1 tablet (300 mg total) by mouth daily. 90 tablet 0  ? Cholecalciferol (DIALYVITE VITAMIN D 5000) 125 MCG (5000 UT) capsule Take 5,000 Units by mouth daily.    ? esomeprazole (NEXIUM) 40 MG capsule Take 1 capsule (40 mg total) by mouth daily. 90 capsule 3  ? ezetimibe (ZETIA) 10 MG tablet Take 1 tablet (10 mg total) by mouth daily. 90 tablet 1  ? Fluticasone-Umeclidin-Vilant (TRELEGY ELLIPTA) 100-62.5-25 MCG/ACT AEPB Inhale 1 puff into the lungs daily. 180 each 3  ? Guaifenesin (MUCINEX MAXIMUM STRENGTH) 1200 MG TB12 Take 1,200 mg by mouth 2 (two) times daily.    ? Magnesium 500 MG TABS Take 500 mg by mouth daily with lunch.    ? Multiple Vitamins-Minerals (IMMUNE SUPPORT PO) Take 2 tablets  by mouth daily.    ? Respiratory Therapy Supplies (FLUTTER) DEVI Use as directed 1 each 0  ? rosuvastatin (CRESTOR) 10 MG tablet Take 1 tablet (10 mg total) by mouth daily. 90 tablet 3  ? sodium chloride (OCEAN) 0.65 % SOLN nasal spray Place 1 spray into both nostrils daily as needed for congestion.    ? temazepam (RESTORIL) 15 MG capsule Take 1 capsule (15 mg total) by mouth at bedtime as needed. 90 capsule 1  ? thyroid (ARMOUR THYROID) 60 MG tablet Take 1 tablet (60 mg total) by mouth daily before breakfast. 90  tablet 0  ? UNABLE TO FIND Med Name: immunotherapy infusion once per month    ? vitamin B-12 (CYANOCOBALAMIN) 1000 MCG tablet Take 1,000 mcg by mouth daily.    ? ?No current facility-administered medications for this visit.  ? ?Facility-Administered Medications Ordered in Other Visits  ?Medication Dose Route Frequency Provider Last Rate Last Admin  ? Sonafine emulsion 1 application  1 application. Topical Once Gery Pray, MD      ? ? ?SURGICAL HISTORY:  ?Past Surgical History:  ?Procedure Laterality Date  ? BALLOON DILATION N/A 04/29/2021  ? Procedure: BALLOON DILATION;  Surgeon: Milus Banister, MD;  Location: [facility];  Service: Endoscopy;  Laterality: N/A;  ? BIOPSY  01/14/2021  ? Procedure: BIOPSY;  Surgeon: Garner Nash, DO;  Location: [facility];  Service: Pulmonary;;  ? BRONCHIAL BIOPSY  10/08/2020  ? Procedure: BRONCHIAL BIOPSIES;  Surgeon: Garner Nash, DO;  Location: [facility];  Service: Pulmonary;;  ? BRONCHIAL BRUSHINGS  10/08/2020  ? Procedure: BRONCHIAL BRUSHINGS;  Surgeon: Garner Nash, DO;  Location: [facility];  Service: Pulmonary;;  ? BRONCHIAL BRUSHINGS  01/14/2021  ? Procedure: BRONCHIAL BRUSHINGS;  Surgeon: Garner Nash, DO;  Location: [facility];  Service: Pulmonary;;  ? BRONCHIAL WASHINGS  10/08/2020  ? Procedure: BRONCHIAL WASHINGS;  Surgeon: Garner Nash, DO;  Location: [facility];  Service: Pulmonary;;  ? BRONCHIAL WASHINGS  01/14/2021  ? Procedure: BRONCHIAL WASHINGS;  Surgeon: Garner Nash, DO;  Location: [facility];  Service: Pulmonary;;  ? ESOPHAGOGASTRODUODENOSCOPY (EGD) WITH PROPOFOL N/A 04/29/2021  ? Procedure: ESOPHAGOGASTRODUODENOSCOPY (EGD) WITH PROPOFOL;  Surgeon: Milus Banister, MD;  Location: [facility];  Service: Endoscopy;  Laterality: N/A;  ? FINE NEEDLE ASPIRATION  10/08/2020  ? Procedure: FINE NEEDLE ASPIRATION (FNA) LINEAR;  Surgeon: Garner Nash, DO;  Location: [facility];  Service: Pulmonary;;  ? LEFT HEART CATH AND CORONARY  ANGIOGRAPHY N/A 11/07/2017  ? Procedure: LEFT HEART CATH AND CORONARY ANGIOGRAPHY;  Surgeon: Nelva Bush, MD;  Location: [facility];  Service: Cardiovascular;  Laterality: N/A;  ? NASAL SINUS SURGERY  1986  ? VIDEO BRONCHOSCOPY Bilateral 01/29/2019  ? Procedure: VIDEO BRONCHOSCOPY WITH FLUORO;  Surgeon: Marshell Garfinkel, MD;  Location: [facility];  Service: Cardiopulmonary;  Laterality: Bilateral;  ? VIDEO BRONCHOSCOPY Right 01/14/2021  ? Procedure: VIDEO BRONCHOSCOPY WITHOUT FLUORO;  Surgeon: Garner Nash, DO;  Location: [facility];  Service: Pulmonary;  Laterality: Right;  possible cryotherapy  ? VIDEO BRONCHOSCOPY WITH ENDOBRONCHIAL ULTRASOUND N/A 10/08/2020  ? Procedure: VIDEO BRONCHOSCOPY WITH ENDOBRONCHIAL ULTRASOUND;  Surgeon: Garner Nash, DO;  Location: [facility];  Service: Pulmonary;  Laterality: N/A;  ? ? ?REVIEW OF SYSTEMS:  Constitutional: negative ?Eyes: negative ?Ears, nose, mouth, throat, and face: negative ?Respiratory: positive for cough ?Cardiovascular: negative ?Gastrointestinal: negative ?Genitourinary:negative ?Integument/breast: negative ?Hematologic/lymphatic: negative ?Musculoskeletal:negative ?Neurological: negative ?Behavioral/Psych: negative ?Endocrine: negative ?Allergic/Immunologic: negative  ? ?  PHYSICAL EXAMINATION: General appearance: alert, cooperative, and no distress ?Head: Normocephalic, without obvious abnormality, atraumatic ?Neck: no adenopathy, no JVD, supple, symmetrical, trachea midline, and thyroid not enlarged, symmetric, no tenderness/mass/nodules ?Lymph nodes: Cervical, supraclavicular, and axillary nodes normal. ?Resp: clear to auscultation bilaterally ?Back: symmetric, no curvature. ROM normal. No CVA tenderness. ?Cardio: regular rate and rhythm, S1, S2 normal, no murmur, click, rub or gallop ?GI: soft, non-tender; bowel sounds normal; no masses,  no organomegaly ?Extremities: extremities normal, atraumatic, no cyanosis or edema ?Neurologic:  Alert and oriented X 3, normal strength and tone. Normal symmetric reflexes. Normal coordination and gait ? ?ECOG PERFORMANCE STATUS: 1 - Symptomatic but completely ambulatory ? ?Blood pressure (!) 160/73, puls

## 2021-07-02 ENCOUNTER — Ambulatory Visit: Payer: Medicare Other | Admitting: Infectious Diseases

## 2021-07-02 ENCOUNTER — Encounter: Payer: Self-pay | Admitting: Internal Medicine

## 2021-07-02 LAB — RESPIRATORY CULTURE OR RESPIRATORY AND SPUTUM CULTURE
MICRO NUMBER:: 13185783
SPECIMEN QUALITY:: ADEQUATE

## 2021-07-06 ENCOUNTER — Encounter: Payer: Self-pay | Admitting: Infectious Diseases

## 2021-07-06 ENCOUNTER — Ambulatory Visit (INDEPENDENT_AMBULATORY_CARE_PROVIDER_SITE_OTHER): Payer: Medicare Other | Admitting: Infectious Diseases

## 2021-07-06 ENCOUNTER — Other Ambulatory Visit: Payer: Self-pay | Admitting: Infectious Diseases

## 2021-07-06 ENCOUNTER — Other Ambulatory Visit: Payer: Self-pay

## 2021-07-06 ENCOUNTER — Telehealth: Payer: Self-pay

## 2021-07-06 VITALS — BP 161/88 | HR 84 | Temp 98.0°F | Ht 64.0 in | Wt 130.0 lb

## 2021-07-06 DIAGNOSIS — A498 Other bacterial infections of unspecified site: Secondary | ICD-10-CM

## 2021-07-06 DIAGNOSIS — Z5181 Encounter for therapeutic drug level monitoring: Secondary | ICD-10-CM | POA: Diagnosis not present

## 2021-07-06 DIAGNOSIS — R918 Other nonspecific abnormal finding of lung field: Secondary | ICD-10-CM

## 2021-07-06 DIAGNOSIS — A439 Nocardiosis, unspecified: Secondary | ICD-10-CM | POA: Diagnosis not present

## 2021-07-06 NOTE — Progress Notes (Addendum)
[facility]  ?                                                           [place], [place], [place], [address] ?                                                                 Phn. [phone]; Fax: [phone] ?                                                                            Date: 07/06/21 ? ?Reason for Follow up: Temperance in sputum cultures  ? ?Assessment ?Stage IIIA non-small cell lung cancer, squamous cell carcinoma diagnosed in July 2022: s/p chemo and radiation, ongoing Immunotherapy. Closely followed by Oncology Dr Julien Nordmann  ? 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 ?Pertinent Microbiology  ?06/28/21 Resp cultures Stenotrophomonas maltophilia ?06/24/21 AFB smear negative and cx negative ?06/23/21 AFB smear negaive,prelim cx negative  ?01/14/21  BAL AFB smear negative, Cx grew MAC; 40,000 colonies Neisseria sicca ?07/06/20 sputum  Hemophilus parainfluenzae ?11/26/19 Sputum culture smear and culture negative for MAC ?07/16/19 Sputum culture AFB smear-negative, culture positive for MAC ?01/29/19 BAL culture AFB smear negative and Cx growing MAC ?09/30/18 Stenotrophomonas maltiphilia, yeast ? ? ?Plan ?She has not had any worsening respiratory symptoms since I last saw her. CT chest has mentioned some new ground glass opacities and pulmonary nodular densities bilaterally.  Infectious/inflammatory and malignant etiologies are both possible. 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 Interestingly, she symptomatically stays the same without treatment of any of the sputum isolates. Of note, she was using peppermint to induce sputum as she does not cough or produce phlegm   ? ?Discussed at length regarding treatment of nocardia would be possibly bactrim for at least 6 months ( will also need to get an MRI Brain to exclude CNS involvement if treatment is considered). I overall do not strongly favor treatment of nocardia at this time given poor sputum collection techniques with recent negative cultures + no worsening symptoms. However, I would repeat CT chest in a month per radiologist's advice. ? ?I have also recommended to fu with Pulmonary if she can undergo bronch/BAL  vs induced sputum sample and get a lower respiratory tract sample for helping with the diagnosis ? ?I will trial her with Bactrim  2 tabs PO BID for 2 weeks for her recent sputum cultures growing stenotrophomonas maltophilia in the interim. She denies any allergies to sulpha drugs. Fu in 5-7 days for BMP ? ?Fu in 6 weeks with me after  CT chest and pulm appt.  ? ?All questions and concerns were discussed and addressed. Patient verbalized understanding of the plan. ?____________________________________________________________________________________________________________________ ? ?HPI: 66 Y O Female with PMH of COPD, Squamous cell ca of lung on tx being followed by Oncology, h/o Pulmonary MAC infection ( off tx since Nov 2021), EX smoker, HTN, HLD, MI referred to [place] for BAL cultures 01/14/21 growing MAC again ? ?Initially seen by [name] in 09/2018 when she was treated with a 10 day course of bactrim for sputum cx growing Stenotrophomonas maltophilia. She was having chronic cough at that time with other possible consideration for COPD, lung nodules and ? GERD for her cough. BAL 01/29/2019 AFB smear negative but cx grew MAC. She was eventually started on triple therapy three times a week Azithromycin 553m, Rifampin 6077mand ethambutol 160052mon 02/25/2019,. She was changed from three times a week to daily regimen on 04/15/2019 given no significant improvement in cough which reportedly improved the cough.   Treatment course complicated by dental staining with Rifampin  which was stopped around 07/2019 with a plan to switch to clofazimine. Patient did not want to take clofazimine and continued taking Rifampin and eventually switched to Clofazimine on 11/26/2019. Cough had become worse since switch to clofazimine and patient was referred to Dr StoLorenda Cahillr 2nd opinion. Seen by Dr StoLorenda Cahill 01/13/20. Dr StoLorenda Cahills not entirely convinced about Pulmonary MAC infection and stopped clofazimine, resumed rifampin, decreased dose of azithromycin from 500m29m daily to 250mg27mdaily and continued on ethambutol. Also referred to pulmonary rehab and gave additional cups for sputum AFB cultures. She was last seen by dr Van DTommy Medal/24/22 when she had improvement in cough but chronic nausea. She had already stopped taking anti mycobacterial abtx at that time ( she tells me she stopped in Nov 2021). It was planned to monitor off tx at that visit. Patient wants to see a different provider this time.  ? ?Unfortunately, she was diagnosed with squamous cell lung ca in July 2022 s/p chemoradiation and is undergoing immunotherapy with Dr MohamJulien Nordmannre is a plan for repeat CT chest after # cycle 3 and possible consideration for systemic chemotherapy and radiation if disease progression or no disease improvement ? ?She has worsening SOB for the last 2 weeks. Feels like there is a lemon inside the throat and she is having hard time breathing. She can't take a deep breath. Prior to 2 weeks, she was able to walk in the tread mill for 30 minutes without any issues on a regular basis. She denies coughing and denies producing phlegm. Denies chest pain. She thinks her SOB has acutely changed in the last 2 weeks and her breathing was relatively stable prior to that. At home, her husband does cooking, she helps with dishes and cleaning house but has to take small breaks in between. She does not walk much/climb stairs.  ? ?Denies fevers, chills and sweats.  Appetite is good and tells she has gained 4 lbs.  ?Denies nausea, vomiting, abdominal pain and diarrhea ?Denies any GU symptoms ?Denies any headache and blurry vision.  ? ?Ex , smoker, quit in 2014. Used to smoke 1.5 pack of cigarettes for 35 years. Used to drink alcohol socially which she has quit 6 years ago. Denies IVDU. Lives with her husband. She used to do an office work previously. She used to have dog before, denies contact with farm animals. Denies travel out of the country and has not  traveled any where that she can think of except to beaches.  ? ?07/07/21 ?Last seen by Dr Candiss Norse 3/21 for sputum culture growing Nocardia asetroides where this was thought to be a contaminant and more sputum cultures were collected. AF sputum smear and cx 3/22 and 23 prelim with negative smear and cx so far, respiratory cultures 3/27 however grew Stenotrophomonas maltophilia. Symptomatically, she tells me no different than when I saw her last. However, she was exhausted last 2 days with minimal occasional chest pain. She also tells me she was started on a new medication " armour thyroid" for last 2 days and considering if that could be the cause. She denies fevers, chills and sweats. Denies nausea, vomiting and diarrhea. She has no cough, she had to use peppermint to help cough to bring sputum to the office as she does not have cough. Her appetite is OK. Weight has been stable. She uses her trelegy first thing in the morning and takes a deep breath and has headache later but goes away in the couple of hours. She is still able to walk 30 minutes in a tread mill both up and down. Her husband does cooking and grocery and she does the remaining household chores. Seen in the ED  for episode of choking. CT chest showed Near complete re-expansion of the right upper lobe ? ?ROS: ?Constitutional: Negative for fever, chills,  appetite change, fatigue + and unexpected weight change.  ?Respiratory: Negative for cough ?Cardiovascular:  Negative for chest pain, palpitations and leg swelling.  ?Gastrointestinal: Negative for nausea, vomiting, abdominal pain, diarrhea/constipation, .  ?Genitourinary: Negative for dysuria, hematuria, flank pain ?Musculoske

## 2021-07-06 NOTE — Telephone Encounter (Signed)
Called and spoke with patient per Dr. Levonne Spiller request. Dr. West Bali noted that patient was recently seen by Dr. Candiss Norse, and had a follow up scheduled with Dr. Candiss Norse later this month. Communicated to patient that Dr. West Bali was comfortable if patient did not want to be seen today. Patient stated that she did have some concerns, and did want to keep her appointment today. Provider notified. ? ?Binnie Kand, RN  ?

## 2021-07-07 ENCOUNTER — Telehealth: Payer: Self-pay

## 2021-07-07 MED ORDER — SULFAMETHOXAZOLE-TRIMETHOPRIM 800-160 MG PO TABS: 2.0000 | ORAL_TABLET | Freq: Two times a day (BID) | ORAL | 0 refills | Status: DC

## 2021-07-07 NOTE — Telephone Encounter (Signed)
Patient scheduled on 4/12 with lab. ? ? ? ?Jaylyn Booher P Sameria Morss, CMA ? ?

## 2021-07-07 NOTE — Telephone Encounter (Signed)
-----   Message from Rosiland Oz, MD sent at 07/07/2021  8:41 AM EDT ----- ?Regarding: lab appt ?Please make a lab appt for this patient in 5-7 days for BMP monitoring on Bactrim.  I will put in an order for BMP ( standing). ? ?

## 2021-07-09 DIAGNOSIS — A498 Other bacterial infections of unspecified site: Secondary | ICD-10-CM | POA: Insufficient documentation

## 2021-07-09 DIAGNOSIS — Z5181 Encounter for therapeutic drug level monitoring: Secondary | ICD-10-CM | POA: Insufficient documentation

## 2021-07-09 DIAGNOSIS — A439 Nocardiosis, unspecified: Secondary | ICD-10-CM | POA: Insufficient documentation

## 2021-07-13 ENCOUNTER — Other Ambulatory Visit (HOSPITAL_COMMUNITY): Payer: Medicare Other

## 2021-07-14 ENCOUNTER — Other Ambulatory Visit: Payer: Self-pay

## 2021-07-14 ENCOUNTER — Other Ambulatory Visit: Payer: Medicare Other

## 2021-07-14 DIAGNOSIS — Z5181 Encounter for therapeutic drug level monitoring: Secondary | ICD-10-CM

## 2021-07-15 ENCOUNTER — Telehealth: Payer: Self-pay

## 2021-07-15 ENCOUNTER — Other Ambulatory Visit: Payer: Self-pay

## 2021-07-15 ENCOUNTER — Encounter: Payer: Self-pay | Admitting: Internal Medicine

## 2021-07-15 DIAGNOSIS — A310 Pulmonary mycobacterial infection: Secondary | ICD-10-CM

## 2021-07-15 LAB — BASIC METABOLIC PANEL
BUN: 8 mg/dL (ref 7–25)
CO2: 25 mmol/L (ref 20–32)
Calcium: 9.5 mg/dL (ref 8.6–10.4)
Chloride: 98 mmol/L (ref 98–110)
Creat: 0.88 mg/dL (ref 0.50–1.05)
Glucose, Bld: 88 mg/dL (ref 65–99)
Potassium: 4.7 mmol/L (ref 3.5–5.3)
Sodium: 133 mmol/L — ABNORMAL LOW (ref 135–146)

## 2021-07-15 NOTE — Telephone Encounter (Signed)
Error

## 2021-07-15 NOTE — Telephone Encounter (Signed)
Called patient and communicated per Dr. West Bali that labs looked ok, and scheduled patient for another BMP in one week on 07/22/21. Patient stated understanding. ? ?Patient expressed concern that she has been increasingly short of breath since approximately the second day of taking Bactrim. Stated she gets DOE with walking twice between garage and living room. Confirmed that after she rests for a few minutes, she feels fine. Patient stated she used a home pulse oximeter to check her oxygen and it was 92-98%. ? ?Patient wants to know if this is a possible side effect of the Bactrim. No additional questions. ? ?Binnie Kand, RN ? ?

## 2021-07-15 NOTE — Telephone Encounter (Signed)
-----   Message from Rosiland Oz, MD sent at 07/15/2021 12:15 PM EDT ----- ?Please let her know that labs look OK and please have another BMP done in a week. I think she has another week of bactrim left to take. I will order a standing order for BMP. Thanks   ?

## 2021-07-15 NOTE — Telephone Encounter (Signed)
Called patient back to communicate that, per Dr. West Bali, it was unlikely her increased shortness of breath was a side effect of the Bactrim. Patient stated understanding and had no additional questions. ? ?Binnie Kand, RN  ?

## 2021-07-20 ENCOUNTER — Other Ambulatory Visit: Payer: Self-pay | Admitting: Medical

## 2021-07-22 ENCOUNTER — Other Ambulatory Visit: Payer: Self-pay

## 2021-07-22 ENCOUNTER — Other Ambulatory Visit: Payer: Medicare Other

## 2021-07-22 DIAGNOSIS — A310 Pulmonary mycobacterial infection: Secondary | ICD-10-CM

## 2021-07-22 LAB — BASIC METABOLIC PANEL
BUN: 15 mg/dL (ref 7–25)
CO2: 24 mmol/L (ref 20–32)
Calcium: 9 mg/dL (ref 8.6–10.4)
Chloride: 99 mmol/L (ref 98–110)
Creat: 0.96 mg/dL (ref 0.50–1.05)
Glucose, Bld: 94 mg/dL (ref 65–99)
Potassium: 4.8 mmol/L (ref 3.5–5.3)
Sodium: 132 mmol/L — ABNORMAL LOW (ref 135–146)

## 2021-07-26 ENCOUNTER — Ambulatory Visit: Payer: Medicare Other | Admitting: Internal Medicine

## 2021-07-26 ENCOUNTER — Ambulatory Visit: Payer: Medicare Other | Admitting: Pulmonary Disease

## 2021-07-27 NOTE — Progress Notes (Signed)
[facility] ?OFFICE PROGRESS NOTE ? ?Tysinger, Camelia Eng, PA-C ?[address] ?[place] [address] ? ?DIAGNOSIS: Stage IIIA (TX, N2, M0) non-small cell lung cancer, squamous cell carcinoma presented with subcarinal mass/lymphadenopathy diagnosed in July 2022. ? ?PRIOR THERAPY: Weekly carboplatin for AUC of 2 and paclitaxel 45 mg/m2.  First dose expected on 11/02/2020.  Status post 7 cycles.  Last dose was given 12/14/2020 ? ?CURRENT THERAPY: Consolidation treatment with immunotherapy with Imfinzi 1500 Mg IV every 4 weeks.  First dose 01/14/2021.  Status post 7 cycles ? ?INTERVAL HISTORY: ?Katrina Campbell 66 y.o. female returns to the clinic today for a follow-up visit.  The patient was last seen in clinic 1 month ago. The patient denies any recent fevers or night sweats.  She states she felt chills with no fever and general malaise on Sunday, but this has since gone away. She is feeling well today. Her weight is stable.  She reports her baseline dyspnea on exertion.  Denies any cough, chest pain, or hemoptysis. She denies any nausea, vomiting, diarrhea, or constipation. Her PCP readjusted her Synthroid dose on 05/07/21 to 88 mcg. She notes some insomnia and dry mouth which she attributed to occurring when she started synthroid.  Denies any headache or visual changes.  Reports very dry skin that she uses lotion for. Denies rashes. She also sees Dr. Valeta Harms for COPD management.  She also follows with infectious disease due to her history of MAI. A suspected inflammatory nodule was seen on CT last month so she was instructed to have this evaluated by ID. ID started her on a course of BACTRIM which she has since complete. She is receiving a follow up CT tomorrow and has a pulmonology appointment on Monday. She is here today for evaluation and repeat blood work before starting cycle #6. ? ?MEDICAL HISTORY: ?Past Medical History:  ?Diagnosis Date  ? Anxiety   ? COPD (chronic obstructive pulmonary disease) ([facility])   ?  Coronary artery disease   ? Dental staining 07/15/2019  ? Depression   ? History of radiation therapy   ? Right lung- 11/03/20-12/15/20- Dr. Gery Pray  ? Hyperlipidemia   ? Labile hypertension   ? Myocardial infarction [facility]) 2018  ? Pneumonia   ? Pulmonary mycobacterial infection ([facility]) 10/24/2018  ? ? ?ALLERGIES:  is allergic to breztri aerosphere [budeson-glycopyrrol-formoterol], benadryl [diphenhydramine], codeine, prednisone, and statins. ? ?MEDICATIONS:  ?Current Outpatient Medications  ?Medication Sig Dispense Refill  ? acetaminophen (TYLENOL) 500 MG tablet Take 500 mg by mouth every 6 (six) hours as needed for moderate pain or headache.    ? albuterol (PROVENTIL) (2.5 MG/3ML) 0.083% nebulizer solution Take 3 mLs (2.5 mg total) by nebulization every 6 (six) hours as needed for wheezing or shortness of breath. 540 mL 3  ? albuterol (VENTOLIN HFA) 108 (90 Base) MCG/ACT inhaler Inhale 2 puffs into the lungs every 6 (six) hours as needed for wheezing or shortness of breath.    ? aspirin EC 81 MG tablet Take 81 mg by mouth daily with lunch.     ? buPROPion (WELLBUTRIN XL) 300 MG 24 hr tablet TAKE 1 TABLET DAILY 90 tablet 0  ? Cholecalciferol (DIALYVITE VITAMIN D 5000) 125 MCG (5000 UT) capsule Take 5,000 Units by mouth daily.    ? esomeprazole (NEXIUM) 40 MG capsule Take 1 capsule (40 mg total) by mouth daily. 90 capsule 3  ? Fluticasone-Umeclidin-Vilant (TRELEGY ELLIPTA) 100-62.5-25 MCG/ACT AEPB Inhale 1 puff into the lungs daily. 180 each 3  ?  Guaifenesin (MUCINEX MAXIMUM STRENGTH) 1200 MG TB12 Take 1,200 mg by mouth 2 (two) times daily.    ? Magnesium 500 MG TABS Take 500 mg by mouth daily with lunch.    ? Multiple Vitamins-Minerals (IMMUNE SUPPORT PO) Take 2 tablets by mouth daily.    ? Respiratory Therapy Supplies (FLUTTER) DEVI Use as directed 1 each 0  ? rosuvastatin (CRESTOR) 10 MG tablet Take 1 tablet (10 mg total) by mouth daily. (Patient taking differently: Take 10 mg by mouth daily. Patient states she  is taking 20 mg currently (07/06/21)) 90 tablet 3  ? sodium chloride (OCEAN) 0.65 % SOLN nasal spray Place 1 spray into both nostrils daily as needed for congestion.    ? sulfamethoxazole-trimethoprim (BACTRIM DS) 800-160 MG tablet Take 2 tablets by mouth 2 (two) times daily. 56 tablet 0  ? temazepam (RESTORIL) 15 MG capsule Take 1 capsule (15 mg total) by mouth at bedtime as needed. 90 capsule 1  ? thyroid (ARMOUR THYROID) 60 MG tablet Take 1 tablet (60 mg total) by mouth daily before breakfast. 90 tablet 0  ? UNABLE TO FIND Med Name: immunotherapy infusion once per month    ? vitamin B-12 (CYANOCOBALAMIN) 1000 MCG tablet Take 1,000 mcg by mouth daily.    ? ?No current facility-administered medications for this visit.  ? ?Facility-Administered Medications Ordered in Other Visits  ?Medication Dose Route Frequency Provider Last Rate Last Admin  ? durvalumab (IMFINZI) 1,500 mg in sodium chloride 0.9 % 100 mL chemo infusion  1,500 mg Intravenous Once Curt Bears, MD      ? Sonafine emulsion 1 application  1 application. Topical Once Gery Pray, MD      ? ? ?SURGICAL HISTORY:  ?Past Surgical History:  ?Procedure Laterality Date  ? BALLOON DILATION N/A 04/29/2021  ? Procedure: BALLOON DILATION;  Surgeon: Milus Banister, MD;  Location: [facility];  Service: Endoscopy;  Laterality: N/A;  ? BIOPSY  01/14/2021  ? Procedure: BIOPSY;  Surgeon: Garner Nash, DO;  Location: [facility];  Service: Pulmonary;;  ? BRONCHIAL BIOPSY  10/08/2020  ? Procedure: BRONCHIAL BIOPSIES;  Surgeon: Garner Nash, DO;  Location: [facility];  Service: Pulmonary;;  ? BRONCHIAL BRUSHINGS  10/08/2020  ? Procedure: BRONCHIAL BRUSHINGS;  Surgeon: Garner Nash, DO;  Location: [facility];  Service: Pulmonary;;  ? BRONCHIAL BRUSHINGS  01/14/2021  ? Procedure: BRONCHIAL BRUSHINGS;  Surgeon: Garner Nash, DO;  Location: [facility];  Service: Pulmonary;;  ? BRONCHIAL WASHINGS  10/08/2020  ? Procedure: BRONCHIAL WASHINGS;  Surgeon:  Garner Nash, DO;  Location: [facility];  Service: Pulmonary;;  ? BRONCHIAL WASHINGS  01/14/2021  ? Procedure: BRONCHIAL WASHINGS;  Surgeon: Garner Nash, DO;  Location: [facility];  Service: Pulmonary;;  ? ESOPHAGOGASTRODUODENOSCOPY (EGD) WITH PROPOFOL N/A 04/29/2021  ? Procedure: ESOPHAGOGASTRODUODENOSCOPY (EGD) WITH PROPOFOL;  Surgeon: Milus Banister, MD;  Location: [facility];  Service: Endoscopy;  Laterality: N/A;  ? FINE NEEDLE ASPIRATION  10/08/2020  ? Procedure: FINE NEEDLE ASPIRATION (FNA) LINEAR;  Surgeon: Garner Nash, DO;  Location: [facility];  Service: Pulmonary;;  ? LEFT HEART CATH AND CORONARY ANGIOGRAPHY N/A 11/07/2017  ? Procedure: LEFT HEART CATH AND CORONARY ANGIOGRAPHY;  Surgeon: Nelva Bush, MD;  Location: [facility];  Service: Cardiovascular;  Laterality: N/A;  ? NASAL SINUS SURGERY  1986  ? VIDEO BRONCHOSCOPY Bilateral 01/29/2019  ? Procedure: VIDEO BRONCHOSCOPY WITH FLUORO;  Surgeon: Marshell Garfinkel, MD;  Location: [facility];  Service: Cardiopulmonary;  Laterality: Bilateral;  ? VIDEO BRONCHOSCOPY Right 01/14/2021  ? Procedure: VIDEO BRONCHOSCOPY WITHOUT FLUORO;  Surgeon: Garner Nash, DO;  Location: [facility];  Service: Pulmonary;  Laterality: Right;  possible cryotherapy  ? VIDEO BRONCHOSCOPY WITH ENDOBRONCHIAL ULTRASOUND N/A 10/08/2020  ? Procedure: VIDEO BRONCHOSCOPY WITH ENDOBRONCHIAL ULTRASOUND;  Surgeon: Garner Nash, DO;  Location: [facility];  Service: Pulmonary;  Laterality: N/A;  ? ? ?REVIEW OF SYSTEMS:   ?Review of Systems  ?Constitutional: Negative for appetite change, chills, fatigue, fever and unexpected weight change.  ?HENT: Negative for mouth sores, nosebleeds, sore throat and trouble swallowing.   ?Eyes: Negative for eye problems and icterus.  ?Respiratory: Negative for cough, hemoptysis, or wheezing. Positive for dyspnea on exertion.   ?Cardiovascular: Negative for chest pain and leg swelling.  ?Gastrointestinal: Negative for  abdominal pain, constipation, diarrhea, nausea and vomiting.  ?Genitourinary: Negative for bladder incontinence, difficulty urinating, dysuria, frequency and hematuria.   ?Musculoskeletal: Negative for

## 2021-07-29 ENCOUNTER — Inpatient Hospital Stay (HOSPITAL_BASED_OUTPATIENT_CLINIC_OR_DEPARTMENT_OTHER): Payer: Medicare Other | Admitting: Physician Assistant

## 2021-07-29 ENCOUNTER — Inpatient Hospital Stay: Payer: Medicare Other | Attending: Physician Assistant

## 2021-07-29 ENCOUNTER — Other Ambulatory Visit: Payer: Self-pay

## 2021-07-29 ENCOUNTER — Inpatient Hospital Stay: Payer: Medicare Other

## 2021-07-29 ENCOUNTER — Other Ambulatory Visit: Payer: Medicare Other

## 2021-07-29 VITALS — BP 135/91 | HR 91 | Temp 97.9°F | Resp 16 | Ht 64.0 in | Wt 130.5 lb

## 2021-07-29 DIAGNOSIS — Z5112 Encounter for antineoplastic immunotherapy: Secondary | ICD-10-CM | POA: Insufficient documentation

## 2021-07-29 DIAGNOSIS — C3401 Malignant neoplasm of right main bronchus: Secondary | ICD-10-CM | POA: Diagnosis present

## 2021-07-29 DIAGNOSIS — Z79899 Other long term (current) drug therapy: Secondary | ICD-10-CM | POA: Diagnosis not present

## 2021-07-29 DIAGNOSIS — C3491 Malignant neoplasm of unspecified part of right bronchus or lung: Secondary | ICD-10-CM | POA: Diagnosis not present

## 2021-07-29 DIAGNOSIS — R5382 Chronic fatigue, unspecified: Secondary | ICD-10-CM

## 2021-07-29 LAB — CBC WITH DIFFERENTIAL (CANCER CENTER ONLY)
Abs Immature Granulocytes: 0.01 10*3/uL (ref 0.00–0.07)
Basophils Absolute: 0 10*3/uL (ref 0.0–0.1)
Basophils Relative: 0 %
Eosinophils Absolute: 0.1 10*3/uL (ref 0.0–0.5)
Eosinophils Relative: 1 %
HCT: 33.3 % — ABNORMAL LOW (ref 36.0–46.0)
Hemoglobin: 10.6 g/dL — ABNORMAL LOW (ref 12.0–15.0)
Immature Granulocytes: 0 %
Lymphocytes Relative: 19 %
Lymphs Abs: 0.9 10*3/uL (ref 0.7–4.0)
MCH: 26.5 pg (ref 26.0–34.0)
MCHC: 31.8 g/dL (ref 30.0–36.0)
MCV: 83.3 fL (ref 80.0–100.0)
Monocytes Absolute: 0.2 10*3/uL (ref 0.1–1.0)
Monocytes Relative: 4 %
Neutro Abs: 3.4 10*3/uL (ref 1.7–7.7)
Neutrophils Relative %: 76 %
Platelet Count: 168 10*3/uL (ref 150–400)
RBC: 4 MIL/uL (ref 3.87–5.11)
RDW: 14.8 % (ref 11.5–15.5)
WBC Count: 4.6 10*3/uL (ref 4.0–10.5)
nRBC: 0 % (ref 0.0–0.2)

## 2021-07-29 LAB — CMP (CANCER CENTER ONLY)
ALT: 38 U/L (ref 0–44)
AST: 35 U/L (ref 15–41)
Albumin: 4 g/dL (ref 3.5–5.0)
Alkaline Phosphatase: 98 U/L (ref 38–126)
Anion gap: 6 (ref 5–15)
BUN: 13 mg/dL (ref 8–23)
CO2: 26 mmol/L (ref 22–32)
Calcium: 8.9 mg/dL (ref 8.9–10.3)
Chloride: 104 mmol/L (ref 98–111)
Creatinine: 0.69 mg/dL (ref 0.44–1.00)
GFR, Estimated: >60 mL/min (ref >60)
Glucose, Bld: 102 mg/dL — ABNORMAL HIGH (ref 70–99)
Potassium: 3.8 mmol/L (ref 3.5–5.1)
Sodium: 136 mmol/L (ref 135–145)
Total Bilirubin: 0.3 mg/dL (ref 0.3–1.2)
Total Protein: 7 g/dL (ref 6.5–8.1)

## 2021-07-29 LAB — TSH: TSH: 40.473 u[IU]/mL — ABNORMAL HIGH (ref 0.350–4.500)

## 2021-07-29 MED ORDER — SODIUM CHLORIDE 0.9 % IV SOLN
1500.0000 mg | Freq: Once | INTRAVENOUS | Status: AC
  Administered 2021-07-29: 1500 mg via INTRAVENOUS
  Filled 2021-07-29: qty 30

## 2021-07-29 MED ORDER — SODIUM CHLORIDE 0.9 % IV SOLN: Freq: Once | INTRAVENOUS | Status: AC

## 2021-07-29 NOTE — Patient Instructions (Signed)
[facility]  Discharge Instructions: °Thank you for choosing [facility] to provide your oncology and hematology care.  ° °If you have a lab appointment with the Cancer Center, please go directly to the Cancer Center and check in at the registration area. °  °Wear comfortable clothing and clothing appropriate for easy access to any Portacath or PICC line.  ° °We strive to give you quality time with your provider. You may need to reschedule your appointment if you arrive late (15 or more minutes).  Arriving late affects you and other patients whose appointments are after yours.  Also, if you miss three or more appointments without notifying the office, you may be dismissed from the clinic at the provider’s discretion.    °  °For prescription refill requests, have your pharmacy contact our office and allow 72 hours for refills to be completed.   ° °Today you received the following chemotherapy and/or immunotherapy agents :  Durvalumab °    °  °To help prevent nausea and vomiting after your treatment, we encourage you to take your nausea medication as directed. ° °BELOW ARE SYMPTOMS THAT SHOULD BE REPORTED IMMEDIATELY: °*FEVER GREATER THAN 100.4 F (38 °C) OR HIGHER °*CHILLS OR SWEATING °*NAUSEA AND VOMITING THAT IS NOT CONTROLLED WITH YOUR NAUSEA MEDICATION °*UNUSUAL SHORTNESS OF BREATH °*UNUSUAL BRUISING OR BLEEDING °*URINARY PROBLEMS (pain or burning when urinating, or frequent urination) °*BOWEL PROBLEMS (unusual diarrhea, constipation, pain near the anus) °TENDERNESS IN MOUTH AND THROAT WITH OR WITHOUT PRESENCE OF ULCERS (sore throat, sores in mouth, or a toothache) °UNUSUAL RASH, SWELLING OR PAIN  °UNUSUAL VAGINAL DISCHARGE OR ITCHING  ° °Items with * indicate a potential emergency and should be followed up as soon as possible or go to the Emergency Department if any problems should occur. ° °Please show the CHEMOTHERAPY ALERT CARD or IMMUNOTHERAPY ALERT CARD at  check-in to the Emergency Department and triage nurse. ° °Should you have questions after your visit or need to cancel or reschedule your appointment, please contact [facility]  Dept: [phone]  and follow the prompts.  Office hours are 8:00 a.m. to 4:30 p.m. Monday - Friday. Please note that voicemails left after 4:00 p.m. may not be returned until the following business day.  We are closed weekends and major holidays. You have access to a nurse at all times for urgent questions. Please call the main number to the clinic Dept: [phone] and follow the prompts. ° ° °For any non-urgent questions, you may also contact your provider using MyChart. We now offer e-Visits for anyone 18 and older to request care online for non-urgent symptoms. For details visit [email or web address]. °  °Also download the MyChart app! Go to the app store, search "MyChart", open the app, select , and log in with your MyChart username and password. ° °Due to Covid, a mask is required upon entering the hospital/clinic. If you do not have a mask, one will be given to you upon arrival. For doctor visits, patients may have 1 support person aged 18 or older with them. For treatment visits, patients cannot have anyone with them due to current Covid guidelines and our immunocompromised population.  ° °

## 2021-07-30 ENCOUNTER — Other Ambulatory Visit: Payer: Self-pay | Admitting: Medical

## 2021-07-30 ENCOUNTER — Ambulatory Visit
Admission: RE | Admit: 2021-07-30 | Discharge: 2021-07-30 | Disposition: A | Discharge status: Home / Self Care | Payer: Medicare Other | Source: Ambulatory Visit | Attending: Infectious Diseases | Admitting: Infectious Diseases

## 2021-07-30 DIAGNOSIS — R918 Other nonspecific abnormal finding of lung field: Secondary | ICD-10-CM

## 2021-07-30 MED ORDER — THYROID 120 MG PO TABS: 120.0000 mg | ORAL_TABLET | Freq: Every day | ORAL | 0 refills | Status: DC

## 2021-07-30 MED ORDER — IOPAMIDOL (ISOVUE-300) INJECTION 61%
75.0000 mL | Freq: Once | INTRAVENOUS | Status: AC | PRN
  Administered 2021-07-30: 75 mL via INTRAVENOUS

## 2021-08-02 ENCOUNTER — Encounter: Payer: Self-pay | Admitting: Pulmonary Disease

## 2021-08-02 ENCOUNTER — Ambulatory Visit (INDEPENDENT_AMBULATORY_CARE_PROVIDER_SITE_OTHER): Payer: Medicare Other | Admitting: Pulmonary Disease

## 2021-08-02 VITALS — BP 128/72 | HR 83 | Temp 98.2°F | Ht 64.0 in | Wt 130.2 lb

## 2021-08-02 DIAGNOSIS — A310 Pulmonary mycobacterial infection: Secondary | ICD-10-CM

## 2021-08-02 DIAGNOSIS — J449 Chronic obstructive pulmonary disease, unspecified: Secondary | ICD-10-CM | POA: Diagnosis not present

## 2021-08-02 DIAGNOSIS — C3491 Malignant neoplasm of unspecified part of right bronchus or lung: Secondary | ICD-10-CM

## 2021-08-02 DIAGNOSIS — J9819 Other pulmonary collapse: Secondary | ICD-10-CM | POA: Diagnosis not present

## 2021-08-02 NOTE — Progress Notes (Signed)
? ?Synopsis: Referred in January 2021 for former smoker quit 2014 moderate COPD, MAI by Carlena Hurl, PA-C ? ?Subjective:  ? ?PATIENT ID: Katrina Campbell GENDER: female DOB: 1955-12-06, MRN: [id number] ? ?Chief Complaint  ?Patient presents with  ? Follow-up  ?  Patient feels like she is doing good, wants to go over CT scan that was done last week.   ? ? ? ?This is a 66 year old female moderate COPD, former smoker quit 2014, MAI patient last seen in the office by Dr. Vaughan Browner.  Former Dr. Lenna Gilford patient.  Treated for stenotrophomonas in the past.  Prior 3 sputum's AFB, 1/3+ for MAI.  Saw infectious disease to discuss initiation of therapy.  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  Patient was last seen by infectious disease on 04/15/2019.  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  Just started on azithromycin 500 mg daily, rifampin 600 mg daily plus ethambutol 900 mg daily.  Patient does have CT imaging with lower lobe bronchiectasis.  Patient has chronic sputum production.  She is short of breath with daily cough and sputum production.  During this time she is very anxious and hesitant about going out in public due to her chronic cough.  She denies hemoptysis patient's weight has also been stable. ? ?OV 11/06/2019: around the 4th of July patient had congestion and cough that still hasnt really gone away.  Overall doing much better now.  Has less cough and congestion.  Recent follow-up with infectious disease plan for Monday.  Been compliant with her medications.  Still has some daily sputum production.  Rarely has a streak of blood with cough. ? ?OV 04/14/2020: Here today for follow-up.  Establish care with me back last year.  Was already under treatment for her MAI.  Followed up with infectious disease here in [place] as well as met with ID at [facility].  Decision  was made to come off of treatments.  Recent sputum's have been negative.  She does have significant emphysema currently compliant with inhaler regimen.  From a respiratory standpoint she is doing fine.  No significant symptom change after stopping antibiotics.  ? ?OV 10/01/2020: Here today for follow-up had abnormal lung cancer screening CT follow-up with an enlarged mediastinal node.  Patient was sent for nuclear medicine PET scan.  PET scan revealed a significantly PET avid station 7 subcarinal node with SUV of 11.  We reviewed this today in the office.  Concerning for underlying malignancy. ? ?OV 10/09/2020: Here today for follow-up.  Recent CT scan of the chest had enlarged mediastinal node.  PET scan with PET avid station 7 and irregular lining of the right mainstem.  Patient was taken for video bronchoscopy on 10/08/2020.  Bronchoscopy revealed enlarged subcarinal adenopathy which was sampled under ultrasound guidance.  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  Endobronchial brushings, forcep biopsies of the abnormal mucosa and visible tumor were taken. ? ?OV 02/10/2021: having difficulty breathing, shes is more short of breath.  She was taken for bronchoscopy recently with repeat biopsy which did show persistence of her malignancy.  Her culture results also were positive again for MAI.  I did reach out to infectious disease today for her symptoms should consider treatment options again.  Especially in lieu of of her immune suppression receiving intermittent therapy. ? ?  OV 03/03/2021: having lots of difficulty sob. Also she feels like food sometimes gets stuck with swallowing.  Overall feels anxious that her shortness of breath is not getting much better.  Saw infectious disease in, and once again and agreed to continue to hold therapy for MAI.  I agree she does not have a lot of radiographic evidence of MAI.  She still has  significant amount of sputum production.  She has not been using her vest therapy too much because it worsens back pain.  Trying to use her flutter valve. ? ?OV 04/23/2021: Here today for follow up.  Recently had a chest x-ray with right upper lobe collapse persistent.  She ultimately end up going to the ER having an episode of feeling choked and ultimately what sounds like she passed a mucous plug.  Repeat imaging shows reinflation of the right upper lobe.  She feels much better after all this happened.  Started immunotherapy and has been tolerating that well with medical oncology.  Encouraged her to continue to use her breathing treatments and flutter valve to help with airway clearance techniques. ? ?OV 08/02/2021: Follow-up for COPD and recurrent right upper lobe atelectasis.  She had a recent CT completed by infectious disease that shows persistent atelectasis in the right upper lobe.  It is consistent with her previous x-rays.  She still doing her nebulized treatments and vest therapy twice daily with as needed flutter valve.  She has very little cough and sputum production.  Not been very successful in producing sputum for cultures.  She follows up with infectious disease in a few weeks.  Not on any treatments at this time for MAI. ? ? ? ? ?Past Medical History:  ?Diagnosis Date  ? Anxiety   ? COPD (chronic obstructive pulmonary disease) ([place])   ? Coronary artery disease   ? Dental staining 07/15/2019  ? Depression   ? History of radiation therapy   ? Right lung- 11/03/20-12/15/20- Dr. Gery Pray  ? Hyperlipidemia   ? Labile hypertension   ? Myocardial infarction [facility]) 2018  ? Pneumonia   ? Pulmonary mycobacterial infection ([place]) 10/24/2018  ?  ? ?Family History  ?Problem Relation Age of Onset  ? Heart attack Mother   ? Emphysema Mother   ? Congestive Heart Failure Mother   ? Heart attack Father   ? Congestive Heart Failure Father   ? Asthma Other   ? Lung disease Sister   ? Lung disease Brother   ? Colon  cancer Neg Hx   ?  ? ?Past Surgical History:  ?Procedure Laterality Date  ? BALLOON DILATION N/A 04/29/2021  ? Procedure: BALLOON DILATION;  Surgeon: Milus Banister, MD;  Location: [facility];  Service: Endoscopy;  Laterality: N/A;  ? BIOPSY  01/14/2021  ? Procedure: BIOPSY;  Surgeon: Garner Nash, DO;  Location: [facility];  Service: Pulmonary;;  ? BRONCHIAL BIOPSY  10/08/2020  ? Procedure: BRONCHIAL BIOPSIES;  Surgeon: Garner Nash, DO;  Location: [facility];  Service: Pulmonary;;  ? BRONCHIAL BRUSHINGS  10/08/2020  ? Procedure: BRONCHIAL BRUSHINGS;  Surgeon: Garner Nash, DO;  Location: [facility];  Service: Pulmonary;;  ? BRONCHIAL BRUSHINGS  01/14/2021  ? Procedure: BRONCHIAL BRUSHINGS;  Surgeon: Garner Nash, DO;  [facility];  Service: Pulmonary;;  ? BRONCHIAL WASHINGS  10/08/2020  ? Procedure: BRONCHIAL WASHINGS;  Surgeon: Garner Nash, DO;  Location: [facility];  Service: Pulmonary;;  ? BRONCHIAL WASHINGS  01/14/2021  ? Procedure: BRONCHIAL WASHINGS;  Surgeon: Garner Nash, DO;  Location: [facility];  Service: Pulmonary;;  ? ESOPHAGOGASTRODUODENOSCOPY (EGD) WITH PROPOFOL N/A 04/29/2021  ? Procedure: ESOPHAGOGASTRODUODENOSCOPY (EGD) WITH PROPOFOL;  Surgeon: Milus Banister, MD;  Location: [facility];  Service: Endoscopy;  Laterality: N/A;  ? FINE NEEDLE ASPIRATION  10/08/2020  ? Procedure: FINE NEEDLE ASPIRATION (FNA) LINEAR;  Surgeon: Garner Nash, DO;  Location: [facility];  Service: Pulmonary;;  ? LEFT HEART CATH AND CORONARY ANGIOGRAPHY N/A 11/07/2017  ? Procedure: LEFT HEART CATH AND CORONARY ANGIOGRAPHY;  Surgeon: Nelva Bush, MD;  Location: [facility];  Service: Cardiovascular;  Laterality: N/A;  ? NASAL SINUS SURGERY  1986  ? VIDEO BRONCHOSCOPY Bilateral 01/29/2019  ? Procedure: VIDEO BRONCHOSCOPY WITH FLUORO;  Surgeon: Marshell Garfinkel, MD;  Location: [facility];  Service: Cardiopulmonary;  Laterality: Bilateral;  ? VIDEO BRONCHOSCOPY Right  01/14/2021  ? Procedure: VIDEO BRONCHOSCOPY WITHOUT FLUORO;  Surgeon: Garner Nash, DO;  Location: [facility];  Service: Pulmonary;  Laterality: Right;  possible cryotherapy  ? VIDEO BRONCHOSCOPY WITH

## 2021-08-02 NOTE — Patient Instructions (Signed)
Thank you for visiting Dr. Valeta Harms at [facility]. ?Today we recommend the following: ? ?Continue Trelegy  ? ?Return in about 6 months (around 02/02/2022) for with APP or Dr. Valeta Harms. ? ? ? ?Please do your part to reduce the spread of COVID-19.  ? ?

## 2021-08-08 ENCOUNTER — Encounter: Payer: Self-pay | Admitting: Cardiology

## 2021-08-08 LAB — MYCOBACTERIA,CULT W/FLUOROCHROME SMEAR
MICRO NUMBER:: 13168274
MICRO NUMBER:: 13175536
SMEAR:: NONE SEEN
SMEAR:: NONE SEEN
SPECIMEN QUALITY:: ADEQUATE
SPECIMEN QUALITY:: ADEQUATE

## 2021-08-09 MED ORDER — ROSUVASTATIN CALCIUM 20 MG PO TABS
20.0000 mg | ORAL_TABLET | Freq: Every day | ORAL | 3 refills | Status: DC
Start: 2021-08-09 — End: 2022-08-22

## 2021-08-17 ENCOUNTER — Telehealth: Payer: Self-pay | Admitting: Internal Medicine

## 2021-08-17 NOTE — Telephone Encounter (Signed)
Rescheduled 06/22 appointment time due to provider pal, patient has ben called and notified. ?

## 2021-08-18 ENCOUNTER — Other Ambulatory Visit: Payer: Medicare Other

## 2021-08-18 ENCOUNTER — Other Ambulatory Visit: Payer: Self-pay

## 2021-08-18 DIAGNOSIS — R918 Other nonspecific abnormal finding of lung field: Secondary | ICD-10-CM

## 2021-08-18 NOTE — Addendum Note (Signed)
Addended by: Caffie Pinto on: 08/18/2021 11:47 AM ? ? Modules accepted: Orders ? ?

## 2021-08-23 ENCOUNTER — Ambulatory Visit (INDEPENDENT_AMBULATORY_CARE_PROVIDER_SITE_OTHER): Payer: Medicare Other | Admitting: Infectious Diseases

## 2021-08-23 ENCOUNTER — Ambulatory Visit: Payer: Medicare Other | Admitting: Infectious Diseases

## 2021-08-23 ENCOUNTER — Encounter: Payer: Self-pay | Admitting: Infectious Diseases

## 2021-08-23 ENCOUNTER — Other Ambulatory Visit: Payer: Self-pay

## 2021-08-23 VITALS — BP 119/71 | HR 82 | Temp 97.8°F | Wt 129.0 lb

## 2021-08-23 DIAGNOSIS — A439 Nocardiosis, unspecified: Secondary | ICD-10-CM

## 2021-08-23 DIAGNOSIS — A310 Pulmonary mycobacterial infection: Secondary | ICD-10-CM | POA: Diagnosis not present

## 2021-08-23 DIAGNOSIS — R918 Other nonspecific abnormal finding of lung field: Secondary | ICD-10-CM

## 2021-08-23 NOTE — Progress Notes (Unsigned)
[facility]                                                             [place], [place], [place], [address]                                                                  Phn. [phone]; Fax: [phone]                                                                             Date: 08/25/21  Reason for Follow up: [name] in sputum cultures   Assessment Stage IIIA non-small cell lung cancer, squamous cell carcinoma diagnosed in July 2022: s/p chemo and radiation, ongoing Immunotherapy. 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 smoker  Pertinent Microbiology  08/18/21 sputum smear negative, cx NGTD 07/22/21 sputum smear negative, cx NGTD 06/28/21 Resp cultures Stenotrophomonas maltophilia 06/24/21 AFB smear negative and cx negative 06/23/21 AFB smear negaive,prelim cx negative  05/10/21 sputum cx Nocardia asteroides complex 01/14/21  BAL AFB smear negative, Cx grew MAC; 40,000 colonies Neisseria sicca 07/06/20 sputum  Hemophilus parainfluenzae 11/26/19 Sputum culture smear and culture negative for MAC 07/16/19 Sputum culture AFB smear-negative, culture positive for MAC 01/29/19 BAL culture AFB smear negative and Cx growing MAC 09/30/18 Stenotrophomonas maltiphilia, yeast   Plan Given her most recent AFB sputum cx and smear remain negative without any targeted treatment for Nocardia/NTM + stable symptoms + non worsening CT chest findings, continue to monitor. Further need to image per Oncology  [name] with Pulmonary as well as Oncology as instructed  [name] Final AFB sputum cultures  [name] in 5 months or as needed  All questions and concerns were discussed and addressed. Patient verbalized understanding of the  plan. 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 She was having chronic cough at that time with other possible consideration for COPD, lung nodules and ? GERD for her cough. BAL 01/29/2019 AFB smear negative but cx grew MAC. She was eventually started on triple therapy three times a week Azithromycin 521m, Rifampin 6034mand ethambutol 160057mon 02/25/2019,. She was changed from three times a week to daily regimen on 04/15/2019 given no significant improvement in cough which reportedly improved the cough.  Treatment course complicated  by dental staining with Rifampin  which was stopped around 07/2019 with a plan to switch to clofazimine. Patient did not want to take clofazimine and continued taking Rifampin and eventually switched to Clofazimine on 11/26/2019. Cough had become worse since switch to clofazimine and patient was referred to [name] for 2nd opinion. Seen by [name] on 01/13/20. [name] was not entirely convinced about Pulmonary MAC infection and stopped clofazimine, resumed rifampin, decreased dose of azithromycin from 547m PO daily to 2567mPO daily and continued on ethambutol. Also referred to pulmonary rehab and gave additional cups for sputum AFB cultures. She was last seen by [name] 05/28/20 when she had improvement in cough but chronic nausea. She had already stopped taking anti mycobacterial abtx at that time ( she tells me she stopped in Nov 2021). It was planned to monitor off tx at that visit. Patient wants to see a different provider  this time.   Unfortunately, she was diagnosed with squamous cell lung ca in July 2022 s/p chemoradiation and is undergoing immunotherapy with Dr MoJulien NordmannThere is a plan for repeat CT chest after # cycle 3 and possible consideration for systemic chemotherapy and radiation if disease progression or no disease improvement  She has worsening SOB for the last 2 weeks. Feels like there is a lemon inside the throat and she is having hard time breathing. She can't take a deep breath. Prior to 2 weeks, she was able to walk in the tread mill for 30 minutes without any issues on a regular basis. She denies coughing and denies producing phlegm. Denies chest pain. She thinks her SOB has acutely changed in the last 2 weeks and her breathing was relatively stable prior to that. At home, her husband does cooking, she helps with dishes and cleaning house but has to take small breaks in between. She does not walk much/climb stairs.   Denies fevers, chills and sweats. Appetite is good and tells she has gained 4 lbs.  Denies nausea, vomiting, abdominal pain and diarrhea Denies any GU symptoms Denies any headache and blurry vision.   Ex , smoker, quit in 2014. Used to smoke 1.5 pack of cigarettes for 35 years. Used to drink alcohol socially which she has quit 6 years ago. Denies IVDU. Lives with her husband. She used to do an office work previously. She used to have dog before, denies contact with farm animals. Denies travel out of the country and has not traveled any where that she can think of except to beaches.   07/07/21 Last seen by [name]/21 for sputum culture growing Nocardia asetroides where this was thought to be a contaminant and more sputum cultures were collected. AF sputum smear and cx 3/22 and 23 prelim with negative smear and cx so far, respiratory cultures 3/27 however grew Stenotrophomonas maltophilia. Symptomatically, she tells me no different than when I saw her last. However, she was exhausted last 2  days with minimal occasional chest pain. She also tells me she was started on a new medication " armour thyroid" for last 2 days and considering if that could be the cause. She denies fevers, chills and sweats. Denies nausea, vomiting and diarrhea. She has no cough, she had to use peppermint to help cough to bring sputum to the office as she does not have cough. Her appetite is OK. Weight has been stable. She uses her trelegy first thing in the morning and takes a deep breath and has headache later  but goes away in the couple of hours. She is still able to walk 30 minutes in a tread mill both up and down. Her husband does cooking and grocery and she does the remaining household chores. Seen in the ED  for episode of choking. CT chest showed Near complete re-expansion of the right upper lobe  08/23/21 After last seen on 4/4, has completed 2 weeks course of PO Bactrim without missing doses or any issues with abtx. No change in symptoms since last visit except some fatigue. She can do Treadmill 30 minutes a day without interruption. Denies cough or phlegm. Mild SOB when overexerting, able to do cleaning, mopping, laundry, washing cars etc. Completed 2 weeks of bactrim. Tells me she was started on 4 different pills for thyroid d/o. Seen by Oncology 4/27, currently on consolidation immunotherapy with Imfinzi 1573m IV every 4 weeks, last dose 4/27. Seen by Pulmonary Dr IValeta Harms5/1 where her symptoms were reported to be stable No plans for bronch/BAL currently. Discussed CT chest findings.   ROS: Constitutional: Negative for fever, chills,  appetite change, fatigue + and unexpected weight change.  Respiratory: Negative for cough Cardiovascular: Negative for chest pain, palpitations and leg swelling.  Gastrointestinal: Negative for nausea, vomiting, abdominal pain, diarrhea/constipation, .  Genitourinary: Negative for dysuria, hematuria, flank pain Musculoskeletal: Negative for myalgias, arthralgia,  joint  swelling, arthralgias Skin: Negative for rashes, lesions  Neurological: Negative for weakness, dizziness or headache  Past Medical History:  Diagnosis Date   Anxiety    COPD (chronic obstructive pulmonary disease) ([place]    Coronary artery disease    Dental staining 07/15/2019   Depression    History of radiation therapy    Right lung- 11/03/20-12/15/20- Dr. JGery Pray  Hyperlipidemia    Labile hypertension    Myocardial infarction ([facility] 2018   Pneumonia    Pulmonary mycobacterial infection ([place] 10/24/2018   Past Surgical History:  Procedure Laterality Date   BALLOON DILATION N/A 04/29/2021   Procedure: BALLOON DILATION;  Surgeon: JMilus Banister MD;  [facility]: [facility];  Service: Endoscopy;  Laterality: N/A;   BIOPSY  01/14/2021   Procedure: BIOPSY;  Surgeon: IGarner Nash DO;  [facility];  Service: Pulmonary;;   BRONCHIAL BIOPSY  10/08/2020   Procedure: BRONCHIAL BIOPSIES;  Surgeon: IGarner Nash DO;  [facility];  Service: Pulmonary;;   BRONCHIAL BRUSHINGS  10/08/2020   Procedure: BRONCHIAL BRUSHINGS;  Surgeon: IGarner Nash DO;  [facility];  Service: Pulmonary;;   BRONCHIAL BRUSHINGS  01/14/2021   Procedure: BRONCHIAL BRUSHINGS;  Surgeon: IGarner Nash DO;  [facility];  Service: Pulmonary;;   BRONCHIAL WASHINGS  10/08/2020   Procedure: BRONCHIAL WASHINGS;  Surgeon: IGarner Nash DO;  [facility];  Service: Pulmonary;;   BRONCHIAL WASHINGS  01/14/2021   Procedure: BRONCHIAL WASHINGS;  Surgeon: IGarner Nash DO;  [facility];  Service: Pulmonary;;   ESOPHAGOGASTRODUODENOSCOPY (EGD) WITH PROPOFOL N/A 04/29/2021   Procedure: ESOPHAGOGASTRODUODENOSCOPY (EGD) WITH PROPOFOL;  Surgeon: JMilus Banister MD;  [facility];  Service: Endoscopy;  Laterality: N/A;   FINE NEEDLE ASPIRATION  10/08/2020   Procedure: FINE NEEDLE ASPIRATION (FNA) LINEAR;  Surgeon: IGarner Nash DO;   [facility];  Service: Pulmonary;;   LEFT HEART CATH AND CORONARY ANGIOGRAPHY N/A 11/07/2017   Procedure: LEFT HEART CATH AND CORONARY ANGIOGRAPHY;  Surgeon: ENelva Bush MD;  [facility];  Service: Cardiovascular;  Laterality: N/A;   NASAL  SINUS SURGERY  1986   VIDEO BRONCHOSCOPY Bilateral 01/29/2019   Procedure: VIDEO BRONCHOSCOPY WITH FLUORO;  Surgeon: Marshell Garfinkel, MD;  Location: [place];  Service: Cardiopulmonary;  Laterality: Bilateral;   VIDEO BRONCHOSCOPY Right 01/14/2021   Procedure: VIDEO BRONCHOSCOPY WITHOUT FLUORO;  Surgeon: Garner Nash, DO;  Location: [place];  Service: Pulmonary;  Laterality: Right;  possible cryotherapy   VIDEO BRONCHOSCOPY WITH ENDOBRONCHIAL ULTRASOUND N/A 10/08/2020   Procedure: VIDEO BRONCHOSCOPY WITH ENDOBRONCHIAL ULTRASOUND;  Surgeon: Garner Nash, DO;  Location: [place];  Service: Pulmonary;  Laterality: N/A;   Current Outpatient Medications on File Prior to Visit  Medication Sig Dispense Refill   acetaminophen (TYLENOL) 500 MG tablet Take 500 mg by mouth every 6 (six) hours as needed for moderate pain or headache.     albuterol (PROVENTIL) (2.5 MG/3ML) 0.083% nebulizer solution Take 3 mLs (2.5 mg total) by nebulization every 6 (six) hours as needed for wheezing or shortness of breath. 540 mL 3   albuterol (VENTOLIN HFA) 108 (90 Base) MCG/ACT inhaler Inhale 2 puffs into the lungs every 6 (six) hours as needed for wheezing or shortness of breath.     aspirin EC 81 MG tablet Take 81 mg by mouth daily with lunch.      buPROPion (WELLBUTRIN XL) 300 MG 24 hr tablet TAKE 1 TABLET DAILY 90 tablet 0   Cholecalciferol (DIALYVITE VITAMIN D 5000) 125 MCG (5000 UT) capsule Take 5,000 Units by mouth daily.     esomeprazole (NEXIUM) 40 MG capsule Take 1 capsule (40 mg total) by mouth daily. 90 capsule 3   Fluticasone-Umeclidin-Vilant (TRELEGY ELLIPTA) 100-62.5-25 MCG/ACT AEPB Inhale 1 puff into the lungs daily. 180  each 3   Guaifenesin (MUCINEX MAXIMUM STRENGTH) 1200 MG TB12 Take 1,200 mg by mouth 2 (two) times daily.     Magnesium 500 MG TABS Take 500 mg by mouth daily with lunch.     Multiple Vitamins-Minerals (IMMUNE SUPPORT PO) Take 2 tablets by mouth daily.     Respiratory Therapy Supplies (FLUTTER) DEVI Use as directed 1 each 0   rosuvastatin (CRESTOR) 20 MG tablet Take 1 tablet (20 mg total) by mouth daily. 90 tablet 3   sodium chloride (OCEAN) 0.65 % SOLN nasal spray Place 1 spray into both nostrils daily as needed for congestion.     temazepam (RESTORIL) 15 MG capsule Take 1 capsule (15 mg total) by mouth at bedtime as needed. 90 capsule 1   thyroid (ARMOUR THYROID) 120 MG tablet Take 1 tablet (120 mg total) by mouth daily before breakfast. 30 tablet 0   UNABLE TO FIND Med Name: immunotherapy infusion once per month     vitamin B-12 (CYANOCOBALAMIN) 1000 MCG tablet Take 1,000 mcg by mouth daily.     Current Facility-Administered Medications on File Prior to Visit  Medication Dose Route Frequency Provider Last Rate Last Admin   Sonafine emulsion 1 application  1 application. Topical Once Gery Pray, MD         Allergies  Allergen Reactions   Arnell Sieving [Budeson-Glycopyrrol-Formoterol] Shortness Of Breath   Benadryl [Diphenhydramine] Nausea Only   Codeine Other (See Comments)    Causes pt to blackout   Prednisone Other (See Comments)    Passed out   Statins     Myalgias at higher doses   Social History   Socioeconomic History   Marital status: Married    Spouse name: Not on file   Number of children: Not on file   Years of  education: Not on file   Highest education level: Not on file  Occupational History   Not on file  Tobacco Use   Smoking status: Former    Packs/day: 1.75    Years: 44.00    Pack years: 77.00    Types: Cigarettes    Quit date: 11/09/2012    Years since quitting: 8.7   Smokeless tobacco: Never   Tobacco comments:    vapor  Vaping Use   Vaping  Use: Former  Substance and Sexual Activity   Alcohol use: Not Currently    Alcohol/week: 24.0 standard drinks    Types: 24 Cans of beer per week   Drug use: Not Currently   Sexual activity: Not Currently  Other Topics Concern   Not on file  Social History Narrative   Not on file   Social Determinants of Health   Financial Resource Strain: Medium Risk   Difficulty of Paying Living Expenses: Somewhat hard  Food Insecurity: No Food Insecurity   Worried About Charity fundraiser in the Last Year: Never true   Ran Out of Food in the Last Year: Never true  Transportation Needs: No Transportation Needs   Lack of Transportation (Medical): No   Lack of Transportation (Non-Medical): No  Physical Activity: Not on file  Stress: Stress Concern Present   Feeling of Stress : Rather much  Social Connections: Unknown   Frequency of Communication with Friends and Family: More than three times a week   Frequency of Social Gatherings with Friends and Family: More than three times a week   Attends Religious Services: More than 4 times per year   Active Member of [employer] or Organizations: Not on file   Attends Archivist Meetings: Not on file   Marital Status: Married  Human resources officer Violence: Not on file   Family History  Problem Relation Age of Onset   Heart attack Mother    Emphysema Mother    Congestive Heart Failure Mother    Heart attack Father    Congestive Heart Failure Father    Asthma Other    Lung disease Sister    Lung disease Brother    Colon cancer Neg Hx    Vitals BP 119/71   Pulse 82   Temp 97.8 F (36.6 C) (Temporal)   Wt 129 lb (58.5 kg)   BMI 22.14 kg/m   Examination  General - not in acute distress, comfortably sitting in chair HEENT - PEERLA, no pallor and no icterus Chest - Bilateral equal air entry with no additional sounds  CVS- Normal s1s2, RRR Abdomen - Soft, Non tender , non distended Ext- no pedal edema Neuro: grossly normal Psych : calm  and cooperative   Recent labs    Latest Ref Rng & Units 07/29/2021    9:34 AM 07/01/2021    9:42 AM 06/03/2021    8:22 AM  CBC  WBC 4.0 - 10.5 K/uL 4.6   6.6   6.0    Hemoglobin 12.0 - 15.0 g/dL 10.6   11.8   11.6    Hematocrit 36.0 - 46.0 % 33.3   36.8   35.5    Platelets 150 - 400 K/uL 168   149   155        Latest Ref Rng & Units 07/29/2021    9:34 AM 07/22/2021    9:36 AM 07/14/2021   10:52 AM  CMP  Glucose 70 - 99 mg/dL 102   94   88  BUN 8 - 23 mg/dL _0 Creatinine 0.44 - 1.00 mg/dL 0.69   0.96   0.88    Sodium 135 - 145 mmol/L 136   132   133    Potassium 3.5 - 5.1 mmol/L 3.8   4.8   4.7    Chloride 98 - 111 mmol/L 104   99   98    CO2 22 - 32 mmol/L _1 Calcium 8.9 - 10.3 mg/dL 8.9   9.0   9.5    Total Protein 6.5 - 8.1 g/dL 7.0      Total Bilirubin 0.3 - 1.2 mg/dL 0.3      Alkaline Phos 38 - 126 U/L 98      AST 15 - 41 U/L 35      ALT 0 - 44 U/L 38        Pertinent Microbiology Results for orders placed or performed in visit on 08/18/21   MYCOBACTERIA, CULTURE, WITH FLUOROCHROME SMEAR     Status: None (Preliminary result)   Collection Time: 08/18/21 11:48 AM   Specimen: Sputum  Result Value Ref Range Status   MICRO NUMBER: [id number]  Preliminary   SPECIMEN QUALITY: Adequate  Preliminary   Source: SPUTUM  Preliminary   STATUS: PRELIMINARY  Preliminary   SMEAR:   Preliminary    No acid-fast bacilli seen using the fluorochrome method.   RESULT:   Preliminary    Culture results to follow. Final reports of negative cultures can be expected in approximately six weeks. Positive cultures are reported immediately.     Pertinent Imaging CT CHEST W CONTRAST  Result Date: 07/31/2021 CLINICAL DATA:  Patient with history of lung cancer. Chemotherapy complete. * Tracking Code: BO * EXAM: CT CHEST WITH CONTRAST TECHNIQUE: Multidetector CT imaging of the chest was performed during intravenous contrast administration. RADIATION DOSE REDUCTION: This exam  was performed according to the departmental dose-optimization program which includes automated exposure control, adjustment of the mA and/or kV according to patient size and/or use of iterative reconstruction technique. CONTRAST:  13m ISOVUE-300 IOPAMIDOL (ISOVUE-300) INJECTION 61% COMPARISON:  CT chest 06/29/2021. FINDINGS: Cardiovascular: Normal heart size. Ascending thoracic aorta measures 4 cm. Trace fluid superior pericardial recess. Thoracic aortic vascular calcifications. Mediastinum/Nodes: No enlarged axillary, mediastinal or hilar lymphadenopathy. Normal appearance of the esophagus. Lungs/Pleura: Central airways are patent. Interval atelectasis of the right upper lobe. Centrilobular paraseptal emphysematous change. Similar-appearing suspected scarring within the medial right lower lobe. Interval development of a 8 mm nodular opacity left lower lobe (image 122; series 5). Significant interval improvement and near complete resolution of previously described 5 mm right lower lobe nodule (image 129; series 5) and adjacent subpleural ground-glass nodules (image 112; series 5), suggestive of resolving infectious/inflammatory process. No pleural effusion or pneumothorax. Upper Abdomen: Similar hepatic hemangioma. Additional 1.4 cm low-attenuation lesion within the liver (image 130; series 2), unchanged from prior. No acute process. Musculoskeletal: No aggressive or acute appearing osseous lesions. IMPRESSION: 1. Interval atelectasis of the right upper lobe. 2. Interval development of an 8 mm nodule within the left lower lobe, potentially infectious/inflammatory in etiology. 3. Significant interval improvement of previously described nodularity within the right lower lobe, most compatible with improving infectious/inflammatory process. 4. Given the new right upper lobe atelectasis and left lower lobe nodule as well as improving right lower lobe nodules, recommend additional follow-up chest CT in 4-6 weeks to  assess for interval  improvement/resolution. Additionally recommend clinical correlation for new right upper lobe atelectasis. 5. Similar-appearing low-attenuation lesions within the liver. Recommend attention on follow-up CTs. 6. Ascending thoracic aorta measures 4 cm. Recommend annual imaging followup by CTA or MRA. This recommendation follows 2010 ACCF/AHA/AATS/ACR/ASA/SCA/SCAI/SIR/STS/SVM Guidelines for the Diagnosis and Management of Patients with Thoracic Aortic Disease. Circulation. 2010; 121: Y111-N356. Aortic aneurysm NOS (ICD10-I71.9) 7. Aortic Atherosclerosis (ICD10-I70.0) and Emphysema (ICD10-J43.9). Electronically Signed   By: Lovey Newcomer M.D.   On: 07/31/2021 11:36    All pertinent labs/Imagings/notes reviewed. All pertinent plain films and CT images have been personally visualized and interpreted; radiology reports have been reviewed. Decision making incorporated into the Impression / Recommendations.  07/30/21 CT Chest  IMPRESSION: 1. Interval atelectasis of the right upper lobe. 2. Interval development of an 8 mm nodule within the left lower lobe, potentially infectious/inflammatory in etiology. 3. Significant interval improvement of previously described nodularity within the right lower lobe, most compatible with improving infectious/inflammatory process. 4. Given the new right upper lobe atelectasis and left lower lobe nodule as well as improving right lower lobe nodules, recommend additional follow-up chest CT in 4-6 weeks to assess for interval improvement/resolution. Additionally recommend clinical correlation for new right upper lobe atelectasis. 5. Similar-appearing low-attenuation lesions within the liver. Recommend attention on follow-up CTs. 6. Ascending thoracic aorta measures 4 cm. Recommend annual imaging followup by CTA or MRA. This recommendation follows 2010 ACCF/AHA/AATS/ACR/ASA/SCA/SCAI/SIR/STS/SVM Guidelines for the Diagnosis and Management of Patients with  Thoracic Aortic Disease. Circulation. 2010; 121: P014-D030. Aortic aneurysm NOS (ICD10-I71.9) 7. Aortic Atherosclerosis (ICD10-I70.0) and Emphysema (ICD10-J43.9).  I have spent a total of 45 minutes of face-to-face and non-face-to-face time, excluding clinical staff time, preparing to see patient, ordering tests and/or medications, and provide counseling the patient    Electronically signed by:  Rosiland Oz, MD Infectious Disease Physician [facility] [address] [place], [place] [address] Phone: [phone]  Fax: [phone]

## 2021-08-25 DIAGNOSIS — A310 Pulmonary mycobacterial infection: Secondary | ICD-10-CM | POA: Insufficient documentation

## 2021-08-26 ENCOUNTER — Inpatient Hospital Stay (HOSPITAL_BASED_OUTPATIENT_CLINIC_OR_DEPARTMENT_OTHER): Payer: Medicare Other | Admitting: Internal Medicine

## 2021-08-26 ENCOUNTER — Encounter: Payer: Self-pay | Admitting: Internal Medicine

## 2021-08-26 ENCOUNTER — Other Ambulatory Visit: Payer: Self-pay

## 2021-08-26 ENCOUNTER — Inpatient Hospital Stay: Payer: Medicare Other

## 2021-08-26 ENCOUNTER — Inpatient Hospital Stay: Payer: Medicare Other | Attending: Physician Assistant

## 2021-08-26 VITALS — BP 146/72 | HR 90 | Temp 98.0°F | Resp 16 | Wt 128.6 lb

## 2021-08-26 DIAGNOSIS — C3491 Malignant neoplasm of unspecified part of right bronchus or lung: Secondary | ICD-10-CM

## 2021-08-26 DIAGNOSIS — Z79899 Other long term (current) drug therapy: Secondary | ICD-10-CM | POA: Diagnosis not present

## 2021-08-26 DIAGNOSIS — Z5112 Encounter for antineoplastic immunotherapy: Secondary | ICD-10-CM | POA: Insufficient documentation

## 2021-08-26 DIAGNOSIS — C3401 Malignant neoplasm of right main bronchus: Secondary | ICD-10-CM | POA: Diagnosis present

## 2021-08-26 DIAGNOSIS — R5382 Chronic fatigue, unspecified: Secondary | ICD-10-CM

## 2021-08-26 LAB — CBC WITH DIFFERENTIAL (CANCER CENTER ONLY)
Abs Immature Granulocytes: 0.03 10*3/uL (ref 0.00–0.07)
Basophils Absolute: 0 10*3/uL (ref 0.0–0.1)
Basophils Relative: 0 %
Eosinophils Absolute: 0.1 10*3/uL (ref 0.0–0.5)
Eosinophils Relative: 1 %
HCT: 31.5 % — ABNORMAL LOW (ref 36.0–46.0)
Hemoglobin: 10.3 g/dL — ABNORMAL LOW (ref 12.0–15.0)
Immature Granulocytes: 1 %
Lymphocytes Relative: 15 %
Lymphs Abs: 1 10*3/uL (ref 0.7–4.0)
MCH: 26.5 pg (ref 26.0–34.0)
MCHC: 32.7 g/dL (ref 30.0–36.0)
MCV: 81 fL (ref 80.0–100.0)
Monocytes Absolute: 0.4 10*3/uL (ref 0.1–1.0)
Monocytes Relative: 7 %
Neutro Abs: 4.8 10*3/uL (ref 1.7–7.7)
Neutrophils Relative %: 76 %
Platelet Count: 143 10*3/uL — ABNORMAL LOW (ref 150–400)
RBC: 3.89 MIL/uL (ref 3.87–5.11)
RDW: 15.2 % (ref 11.5–15.5)
WBC Count: 6.3 10*3/uL (ref 4.0–10.5)
nRBC: 0 % (ref 0.0–0.2)

## 2021-08-26 LAB — CMP (CANCER CENTER ONLY)
ALT: 24 U/L (ref 0–44)
AST: 23 U/L (ref 15–41)
Albumin: 4 g/dL (ref 3.5–5.0)
Alkaline Phosphatase: 99 U/L (ref 38–126)
Anion gap: 6 (ref 5–15)
BUN: 13 mg/dL (ref 8–23)
CO2: 25 mmol/L (ref 22–32)
Calcium: 9.1 mg/dL (ref 8.9–10.3)
Chloride: 110 mmol/L (ref 98–111)
Creatinine: 0.7 mg/dL (ref 0.44–1.00)
GFR, Estimated: >60 mL/min (ref >60)
Glucose, Bld: 93 mg/dL (ref 70–99)
Potassium: 3.9 mmol/L (ref 3.5–5.1)
Sodium: 141 mmol/L (ref 135–145)
Total Bilirubin: 0.3 mg/dL (ref 0.3–1.2)
Total Protein: 7.1 g/dL (ref 6.5–8.1)

## 2021-08-26 LAB — TSH: TSH: 0.537 u[IU]/mL (ref 0.350–4.500)

## 2021-08-26 MED ORDER — SODIUM CHLORIDE 0.9 % IV SOLN: Freq: Once | INTRAVENOUS | Status: AC

## 2021-08-26 MED ORDER — SODIUM CHLORIDE 0.9 % IV SOLN
1500.0000 mg | Freq: Once | INTRAVENOUS | Status: AC
  Administered 2021-08-26: 1500 mg via INTRAVENOUS
  Filled 2021-08-26: qty 30

## 2021-08-26 NOTE — Progress Notes (Signed)
[place] [phone]   [phone]  OFFICE PROGRESS NOTE  Carlena Hurl, PA-C [address] [place] [address]  DIAGNOSIS: Stage IIIA (TX, N2, M0) non-small cell lung cancer, squamous cell carcinoma presented with subcarinal mass/lymphadenopathy diagnosed in July 2022.   PRIOR THERAPY: Weekly carboplatin for AUC of 2 and paclitaxel 45 mg/m2.  First dose expected on 11/02/2020.  Status post 7 cycles.  Last dose was given 12/14/2020   CURRENT THERAPY: Consolidation treatment with immunotherapy with Imfinzi 1500 Mg IV every 4 weeks.  First dose 01/14/2021.  Status post 8 cycles  INTERVAL HISTORY: Katrina Campbell 66 y.o. female returns to the clinic today for follow-up visit.  The patient is feeling fine today with no concerning complaints except for mild fatigue.  She has no chest pain, shortness of breath except with exertion with no cough or hemoptysis.  She has no nausea, vomiting, diarrhea or constipation.  She has no headache or visual changes.  She has no recent weight loss or night sweats.  She is here today for evaluation before starting cycle #9 of her treatment.  MEDICAL HISTORY: Past Medical History:  Diagnosis Date   Anxiety    COPD (chronic obstructive pulmonary disease) ([place])    Coronary artery disease    Dental staining 07/15/2019   Depression    History of radiation therapy    Right lung- 11/03/20-12/15/20- Dr. 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  MEDICATIONS:  Current Outpatient Medications  Medication Sig Dispense Refill   acetaminophen (TYLENOL) 500 MG tablet Take 500 mg by mouth every 6 (six) hours as needed for moderate pain or headache.     albuterol  (PROVENTIL) (2.5 MG/3ML) 0.083% nebulizer solution Take 3 mLs (2.5 mg total) by nebulization every 6 (six) hours as needed for wheezing or shortness of breath. 540 mL 3   albuterol (VENTOLIN HFA) 108 (90 Base) MCG/ACT inhaler Inhale 2 puffs into the lungs every 6 (six) hours as needed for wheezing or shortness of breath.     aspirin EC 81 MG tablet Take 81 mg by mouth daily with lunch.      buPROPion (WELLBUTRIN XL) 300 MG 24 hr tablet TAKE 1 TABLET DAILY 90 tablet 0   Cholecalciferol (DIALYVITE VITAMIN D 5000) 125 MCG (5000 UT) capsule Take 5,000 Units by mouth daily.     esomeprazole (NEXIUM) 40 MG capsule Take 1 capsule (40 mg total) by mouth daily. 90 capsule 3   Fluticasone-Umeclidin-Vilant (TRELEGY ELLIPTA) 100-62.5-25 MCG/ACT AEPB Inhale 1 puff into the lungs daily. 180 each 3   Guaifenesin (MUCINEX MAXIMUM STRENGTH) 1200 MG TB12 Take 1,200 mg by mouth 2 (two) times daily.     Magnesium 500 MG TABS Take 500 mg by mouth daily with lunch.     Multiple Vitamins-Minerals (IMMUNE SUPPORT PO) Take 2 tablets by mouth daily.     Respiratory Therapy Supplies (FLUTTER) DEVI Use as directed 1 each 0   rosuvastatin (CRESTOR) 20 MG tablet Take 1 tablet (20 mg total) by mouth daily. 90 tablet 3   sodium chloride (OCEAN) 0.65 % SOLN nasal spray Place 1 spray into both nostrils daily as needed for congestion.     temazepam (RESTORIL) 15 MG capsule Take 1 capsule (15 mg  total) by mouth at bedtime as needed. 90 capsule 1   thyroid (ARMOUR THYROID) 120 MG tablet Take 1 tablet (120 mg total) by mouth daily before breakfast. 30 tablet 0   UNABLE TO FIND Med Name: immunotherapy infusion once per month     vitamin B-12 (CYANOCOBALAMIN) 1000 MCG tablet Take 1,000 mcg by mouth daily.     No current facility-administered medications for this visit.   Facility-Administered Medications Ordered in Other Visits  Medication Dose Route Frequency Provider Last Rate Last Admin   Sonafine emulsion 1 application  1  application. Topical Once Gery Pray, MD        SURGICAL HISTORY:  Past Surgical History:  Procedure Laterality Date   BALLOON DILATION N/A 04/29/2021   Procedure: BALLOON DILATION;  Surgeon: Milus Banister, MD;  Location: [facility];  Service: Endoscopy;  Laterality: N/A;   BIOPSY  01/14/2021   Procedure: BIOPSY;  Surgeon: Garner Nash, DO;  Location: [facility];  Service: Pulmonary;;   BRONCHIAL BIOPSY  10/08/2020   Procedure: BRONCHIAL BIOPSIES;  Surgeon: Garner Nash, DO;  Location: [facility];  Service: Pulmonary;;   BRONCHIAL BRUSHINGS  10/08/2020   Procedure: BRONCHIAL BRUSHINGS;  Surgeon: Garner Nash, DO;  Location: [facility];  Service: Pulmonary;;   BRONCHIAL BRUSHINGS  01/14/2021   Procedure: BRONCHIAL BRUSHINGS;  Surgeon: Garner Nash, DO;  Location: [facility];  Service: Pulmonary;;   BRONCHIAL WASHINGS  10/08/2020   Procedure: BRONCHIAL WASHINGS;  Surgeon: Garner Nash, DO;  Location: [facility];  Service: Pulmonary;;   BRONCHIAL WASHINGS  01/14/2021   Procedure: BRONCHIAL WASHINGS;  Surgeon: Garner Nash, DO;  Location: [facility];  Service: Pulmonary;;   ESOPHAGOGASTRODUODENOSCOPY (EGD) WITH PROPOFOL N/A 04/29/2021   Procedure: ESOPHAGOGASTRODUODENOSCOPY (EGD) WITH PROPOFOL;  Surgeon: Milus Banister, MD;  Location: [facility];  Service: Endoscopy;  Laterality: N/A;   FINE NEEDLE ASPIRATION  10/08/2020   Procedure: FINE NEEDLE ASPIRATION (FNA) LINEAR;  Surgeon: Garner Nash, DO;  Location: [facility];  Service: Pulmonary;;   LEFT HEART CATH AND CORONARY ANGIOGRAPHY N/A 11/07/2017   Procedure: LEFT HEART CATH AND CORONARY ANGIOGRAPHY;  Surgeon: Nelva Bush, MD;  Location: [facility];  Service: Cardiovascular;  Laterality: N/A;   NASAL SINUS SURGERY  1986   VIDEO BRONCHOSCOPY Bilateral 01/29/2019   Procedure: VIDEO BRONCHOSCOPY WITH FLUORO;  Surgeon: Marshell Garfinkel, MD;  Location: [facility];  Service: Cardiopulmonary;   Laterality: Bilateral;   VIDEO BRONCHOSCOPY Right 01/14/2021   Procedure: VIDEO BRONCHOSCOPY WITHOUT FLUORO;  Surgeon: Garner Nash, DO;  Location: [facility];  Service: Pulmonary;  Laterality: Right;  possible cryotherapy   VIDEO BRONCHOSCOPY WITH ENDOBRONCHIAL ULTRASOUND N/A 10/08/2020   Procedure: VIDEO BRONCHOSCOPY WITH ENDOBRONCHIAL ULTRASOUND;  Surgeon: Garner Nash, DO;  Location: [facility];  Service: Pulmonary;  Laterality: N/A;    REVIEW OF SYSTEMS:  A comprehensive review of systems was negative except for: Constitutional: positive for fatigue Respiratory: positive for dyspnea on exertion   PHYSICAL EXAMINATION: General appearance: alert, cooperative, fatigued, and no distress Head: Normocephalic, without obvious abnormality, atraumatic Neck: no adenopathy, no JVD, supple, symmetrical, trachea midline, and thyroid not enlarged, symmetric, no tenderness/mass/nodules Lymph nodes: Cervical, supraclavicular, and axillary nodes normal. Resp: clear to auscultation bilaterally Back: symmetric, no curvature. ROM normal. No CVA tenderness. Cardio: regular rate and rhythm, S1, S2 normal, no murmur, click, rub or gallop GI: soft, non-tender; bowel sounds normal; no masses,  no organomegaly Extremities: extremities normal, atraumatic, no cyanosis or  edema  ECOG PERFORMANCE STATUS: 1 - Symptomatic but completely ambulatory  Blood pressure (!) 146/72, pulse 90, temperature 98 F (36.7 C), temperature source Tympanic, resp. rate 16, weight 128 lb 9 oz (58.3 kg), SpO2 95 %.  LABORATORY DATA: Lab Results  Component Value Date   WBC 6.3 08/26/2021   HGB 10.3 (L) 08/26/2021   HCT 31.5 (L) 08/26/2021   MCV 81.0 08/26/2021   PLT 143 (L) 08/26/2021      Chemistry      Component Value Date/Time   NA 136 07/29/2021 0934   NA 144 02/10/2020 0813   K 3.8 07/29/2021 0934   CL 104 07/29/2021 0934   CO2 26 07/29/2021 0934   BUN 13 07/29/2021 0934   BUN 10 02/10/2020 0813    CREATININE 0.69 07/29/2021 0934   CREATININE 0.96 07/22/2021 0936      Component Value Date/Time   CALCIUM 8.9 07/29/2021 0934   ALKPHOS 98 07/29/2021 0934   AST 35 07/29/2021 0934   ALT 38 07/29/2021 0934   BILITOT 0.3 07/29/2021 0934       RADIOGRAPHIC STUDIES: CT CHEST W CONTRAST  Result Date: 07/31/2021 CLINICAL DATA:  Patient with history of lung cancer. Chemotherapy complete. * Tracking Code: BO * EXAM: CT CHEST WITH CONTRAST TECHNIQUE: Multidetector CT imaging of the chest was performed during intravenous contrast administration. RADIATION DOSE REDUCTION: This exam was performed according to the departmental dose-optimization program which includes automated exposure control, adjustment of the mA and/or kV according to patient size and/or use of iterative reconstruction technique. CONTRAST:  65mL ISOVUE-300 IOPAMIDOL (ISOVUE-300) INJECTION 61% COMPARISON:  CT chest 06/29/2021. FINDINGS: Cardiovascular: Normal heart size. Ascending thoracic aorta measures 4 cm. Trace fluid superior pericardial recess. Thoracic aortic vascular calcifications. Mediastinum/Nodes: No enlarged axillary, mediastinal or hilar lymphadenopathy. Normal appearance of the esophagus. Lungs/Pleura: Central airways are patent. Interval atelectasis of the right upper lobe. Centrilobular paraseptal emphysematous change. Similar-appearing suspected scarring within the medial right lower lobe. Interval development of a 8 mm nodular opacity left lower lobe (image 122; series 5). Significant interval improvement and near complete resolution of previously described 5 mm right lower lobe nodule (image 129; series 5) and adjacent subpleural ground-glass nodules (image 112; series 5), suggestive of resolving infectious/inflammatory process. No pleural effusion or pneumothorax. Upper Abdomen: Similar hepatic hemangioma. Additional 1.4 cm low-attenuation lesion within the liver (image 130; series 2), unchanged from prior. No acute  process. Musculoskeletal: No aggressive or acute appearing osseous lesions. IMPRESSION: 1. Interval atelectasis of the right upper lobe. 2. Interval development of an 8 mm nodule within the left lower lobe, potentially infectious/inflammatory in etiology. 3. Significant interval improvement of previously described nodularity within the right lower lobe, most compatible with improving infectious/inflammatory process. 4. Given the new right upper lobe atelectasis and left lower lobe nodule as well as improving right lower lobe nodules, recommend additional follow-up chest CT in 4-6 weeks to assess for interval improvement/resolution. Additionally recommend clinical correlation for new right upper lobe atelectasis. 5. Similar-appearing low-attenuation lesions within the liver. Recommend attention on follow-up CTs. 6. Ascending thoracic aorta measures 4 cm. Recommend annual imaging followup by CTA or MRA. This recommendation follows 2010 ACCF/AHA/AATS/ACR/ASA/SCA/SCAI/SIR/STS/SVM Guidelines for the Diagnosis and Management of Patients with Thoracic Aortic Disease. Circulation. 2010; 121: J941-D408. Aortic aneurysm NOS (ICD10-I71.9) 7. Aortic Atherosclerosis (ICD10-I70.0) and Emphysema (ICD10-J43.9). Electronically Signed   By: Lovey Newcomer M.D.   On: 07/31/2021 11:36    ASSESSMENT AND PLAN: This is a very pleasant 66 years old  white female recently diagnosed with a stage IIIA (TX, N2, M0) non-small cell lung cancer, squamous cell carcinoma presented with subcarinal mass/lymphadenopathy diagnosed in July 2022. The patient underwent a course of concurrent chemoradiation with weekly carboplatin for AUC of 2 and paclitaxel 45 Mg/M2 status post 7 cycles. The patient tolerated her treatment well except for fatigue and sore throat. The patient is currently undergoing consolidation treatment with immunotherapy with Imfinzi 1500 Mg IV every 4 weeks status post 8 cycles.   The patient has been tolerating her treatment well  with no concerning adverse effects. I recommended her to proceed with cycle #9 today as planned. I will see her back for follow-up visit in 4 weeks for evaluation before the next cycle of her treatment. For the hypothyroidism, she is currently on levothyroxine by her primary care physician and he is adjusting her medication. The patient was advised to call immediately if she has any other concerning symptoms in the interval.  The patient voices understanding of current disease status and treatment options and is in agreement with the current care plan.  All questions were answered. The patient knows to call the clinic with any problems, questions or concerns. We can certainly see the patient much sooner if necessary.  Disclaimer: This note was dictated with voice recognition software. Similar sounding words can inadvertently be transcribed and may not be corrected upon review.

## 2021-08-26 NOTE — Patient Instructions (Signed)

## 2021-08-27 ENCOUNTER — Ambulatory Visit (INDEPENDENT_AMBULATORY_CARE_PROVIDER_SITE_OTHER): Payer: Medicare Other | Admitting: Medical

## 2021-08-27 VITALS — BP 110/72 | HR 98 | Wt 128.2 lb

## 2021-08-27 DIAGNOSIS — E538 Deficiency of other specified B group vitamins: Secondary | ICD-10-CM

## 2021-08-27 DIAGNOSIS — E039 Hypothyroidism, unspecified: Secondary | ICD-10-CM | POA: Diagnosis not present

## 2021-08-27 DIAGNOSIS — E559 Vitamin D deficiency, unspecified: Secondary | ICD-10-CM | POA: Diagnosis not present

## 2021-08-27 DIAGNOSIS — E785 Hyperlipidemia, unspecified: Secondary | ICD-10-CM | POA: Diagnosis not present

## 2021-08-27 DIAGNOSIS — I2510 Atherosclerotic heart disease of native coronary artery without angina pectoris: Secondary | ICD-10-CM | POA: Diagnosis not present

## 2021-08-27 DIAGNOSIS — C3491 Malignant neoplasm of unspecified part of right bronchus or lung: Secondary | ICD-10-CM

## 2021-08-27 DIAGNOSIS — F5104 Psychophysiologic insomnia: Secondary | ICD-10-CM

## 2021-08-27 DIAGNOSIS — R5383 Other fatigue: Secondary | ICD-10-CM

## 2021-08-27 DIAGNOSIS — J449 Chronic obstructive pulmonary disease, unspecified: Secondary | ICD-10-CM

## 2021-08-27 DIAGNOSIS — F419 Anxiety disorder, unspecified: Secondary | ICD-10-CM

## 2021-08-27 MED ORDER — THYROID 120 MG PO TABS: 120.0000 mg | ORAL_TABLET | Freq: Every day | ORAL | 1 refills | Status: DC

## 2021-08-27 NOTE — Patient Instructions (Addendum)
RESOURCES in [place], [place]  If you are experiencing a mental health crisis or an emergency, please call 911 or go to the nearest emergency department.  [facility]   [phone] [facility]  [phone] [facility]   [phone]   Counseling Services (NON- psychiatrist offices)  Dr. George Hugh, [place] [phone] [place], [place] [address]   [facility] [address], [place], [place] [address] [phone]   [facility] [phone] [address] [place], [place] [address]

## 2021-08-27 NOTE — Progress Notes (Signed)
Subjective:  Katrina Campbell is a 66 y.o. female who presents for Chief Complaint  Patient presents with   follow-up     Follow-up on thyroid. No other concerns     Here for med check.   Hypothyroidism - levothyroxine wasn't getting numbers to goal.  Several medications too expensive.  Currently on Armour Thyroid, 2 x 60mg  daily.   Had labs yesterday trough cancer center  Gets fatigued of late - wonders if its related to stress, poor sleep, having cancer/on therapy, but also worrying about her sister Katrina Campbell whose dementia is getting worse  Sleep not so good currently.  On temazepam.   Didn't get improvements with melatonin in the past.  COPD - no recent issues, not having to use inhaler much.  On Trelegy as usual  No prior sleep study.    Uses treadmill 30 minutes daily.     Home oximetry shows 97-98% room air.   No prior oxygen use  On cancer therapy, immunotherapy for lung cancer  on Wellbutrin for a long time.  Thinks it helps but not sure  No other aggravating or relieving factors.    No other c/o.  The following portions of the patient's history were reviewed and updated as appropriate: allergies, current medications, past family history, past medical history, past social history, past surgical history and problem list.  ROS Otherwise as in subjective above  Objective: BP 110/72   Pulse 98   Wt 128 lb 3.2 oz (58.2 kg)   BMI 22.01 kg/m   General appearance: alert, no distress, well developed, well nourished Psych: pleasant, answers questions appropriately, tearful at times Pulses: 2+ radial pulses, 2+ pedal pulses, normal cap refill Ext: no edema   Assessment: Encounter Diagnoses  Name Primary?   Hypothyroidism, unspecified type Yes   Hyperlipidemia, unspecified hyperlipidemia type    Vitamin D deficiency    Stage III squamous cell carcinoma of right lung (HCC)    Stage 2 moderate COPD by GOLD classification (HCC)    Fatigue, unspecified type    Anxiety     B12 deficiency    Chronic insomnia      Plan: Fatigue - multifactorial including stress, anxiety, cancer patient on therapy, COPD, vitamin deficiencies  Hypothyroidism - I reviewed labs she had done yesterday, TSH much improved.   Continue current dosing of Armour thyroid 120mg  daily.    Hyperlipemia - continue statin  Vit D deficiency - c/t supplement  Lung cancer - on immunotherapy per cancer center  COPD - managed by pulmonology  Anxiety, dysthymia - continue Wellbutrin.  Strongly advised counseling.  Gave list of counselors to try and get appt ASAP.  Advised other ways to cope with anxiety, and having some boundaries when others want to dump other stress on her  B12 deficiency - c/t supplement  Insomnia - using temazepam.  Discussed other options, Belsomra, melatonin, other.  She will stick with Temazepam for now.  Consider sleep study and overnight oximetry but she declines for now   Katrina Campbell was seen today for follow-up .  Diagnoses and all orders for this visit:  Hypothyroidism, unspecified type  Hyperlipidemia, unspecified hyperlipidemia type  Vitamin D deficiency  Stage III squamous cell carcinoma of right lung ([place])  Stage 2 moderate COPD by GOLD classification ([place])  Fatigue, unspecified type  Anxiety  B12 deficiency  Chronic insomnia  Other orders -     thyroid (ARMOUR THYROID) 120 MG tablet; Take 1 tablet (120 mg total) by mouth daily before breakfast.  Follow up: 10mo

## 2021-09-06 LAB — MYCOBACTERIA,CULT W/FLUOROCHROME SMEAR
MICRO NUMBER:: 13294419
SMEAR:: NONE SEEN
SPECIMEN QUALITY:: ADEQUATE

## 2021-09-17 ENCOUNTER — Ambulatory Visit: Payer: Medicare Other

## 2021-09-21 NOTE — Progress Notes (Unsigned)
[facility] OFFICE PROGRESS NOTE  Carlena Hurl, PA-C [place] [address]  DIAGNOSIS: Stage IIIA (TX, N2, M0) non-small cell lung cancer, squamous cell carcinoma presented with subcarinal mass/lymphadenopathy diagnosed in July 2022.  PRIOR THERAPY: Weekly carboplatin for AUC of 2 and paclitaxel 45 mg/m2.  First dose expected on 11/02/2020.  Status post 7 cycles.  Last dose was given 12/14/2020    CURRENT THERAPY: Consolidation treatment with immunotherapy with Imfinzi 1500 Mg IV every 4 weeks.  First dose 01/14/2021.  Status post 9 cycles  INTERVAL HISTORY: Katrina Campbell 66 y.o. female returns to clinic today for follow-up visit.  The patient is feeling fairly well today without any concerning complaints.  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  She also sees pulmonology.  She also has been following closely with her PCP for thyroid dysfunction as well as insomnia.  The patient is currently undergoing consolidation immunotherapy with Imfinzi.  She has completed 9 cycles.  She denies any recent fevers.  Chills?  She denies any night sweats.  She reports her baseline dyspnea on exertion.  Weight and appetite stable.  She denies any cough, chest pain, or hemoptysis.  Denies any nausea, vomiting, diarrhea, or constipation.  Denies any headache or visual changes.  She reports dry skin for which she uses lotion.  Denies any rashes or skin changes.  The patient is here today for evaluation and repeat blood work before starting cycle #10  MEDICAL HISTORY: Past Medical History:  Diagnosis Date   Anxiety    COPD (chronic obstructive pulmonary disease) ([place])    Coronary artery disease    Dental staining 07/15/2019   Depression    History of radiation therapy    Right lung- 11/03/20-12/15/20- Dr. 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  MEDICATIONS:  Current Outpatient Medications  Medication Sig Dispense Refill   acetaminophen (TYLENOL) 500 MG tablet Take 500 mg by mouth every 6 (six) hours as needed for moderate pain or headache.     albuterol (PROVENTIL) (2.5 MG/3ML) 0.083% nebulizer solution Take 3 mLs (2.5 mg total) by nebulization every 6 (six) hours as needed for wheezing or shortness of breath. 540 mL 3   albuterol (VENTOLIN HFA) 108 (90 Base) MCG/ACT inhaler Inhale 2 puffs into the lungs every 6 (six) hours as needed for wheezing or shortness of breath.     aspirin EC 81 MG tablet Take 81 mg by mouth daily with lunch.      buPROPion (WELLBUTRIN XL) 300 MG 24 hr tablet TAKE 1 TABLET DAILY 90 tablet 0   Cholecalciferol (DIALYVITE VITAMIN D 5000) 125 MCG (5000 UT) capsule Take 5,000 Units by mouth daily.     esomeprazole (NEXIUM) 40 MG capsule Take 1 capsule (40 mg total) by mouth daily. 90 capsule 3   Fluticasone-Umeclidin-Vilant (TRELEGY ELLIPTA) 100-62.5-25 MCG/ACT AEPB Inhale 1 puff into the lungs daily. 180 each 3   Guaifenesin (MUCINEX MAXIMUM STRENGTH) 1200 MG TB12 Take 1,200 mg by mouth 2 (two) times daily.     Magnesium 500 MG TABS Take 500 mg by mouth daily with lunch.     Multiple Vitamins-Minerals (IMMUNE SUPPORT PO) Take 2 tablets by mouth daily.     Respiratory Therapy Supplies (FLUTTER) DEVI  Use as directed 1 each 0   rosuvastatin (CRESTOR) 20 MG tablet Take 1 tablet (20 mg total) by mouth daily. 90 tablet 3   sodium chloride (OCEAN) 0.65 % SOLN nasal spray Place 1 spray into both nostrils daily as needed for congestion.     temazepam (RESTORIL) 15 MG capsule Take 1 capsule (15 mg total) by mouth at bedtime as needed. 90 capsule 1   thyroid (ARMOUR THYROID) 120 MG tablet Take 1 tablet (120 mg total) by mouth daily before breakfast. 90 tablet 1    UNABLE TO FIND Med Name: immunotherapy infusion once per month     vitamin B-12 (CYANOCOBALAMIN) 1000 MCG tablet Take 1,000 mcg by mouth daily.     No current facility-administered medications for this visit.   Facility-Administered Medications Ordered in Other Visits  Medication Dose Route Frequency Provider Last Rate Last Admin   Sonafine emulsion 1 application  1 application  Topical Once Gery Pray, MD        SURGICAL HISTORY:  Past Surgical History:  Procedure Laterality Date   BALLOON DILATION N/A 04/29/2021   Procedure: BALLOON DILATION;  Surgeon: Milus Banister, MD;  Location: [facility];  Service: Endoscopy;  Laterality: N/A;   BIOPSY  01/14/2021   Procedure: BIOPSY;  Surgeon: Garner Nash, DO;  Location: [facility];  Service: Pulmonary;;   BRONCHIAL BIOPSY  10/08/2020   Procedure: BRONCHIAL BIOPSIES;  Surgeon: Garner Nash, DO;  Location: [facility];  Service: Pulmonary;;   BRONCHIAL BRUSHINGS  10/08/2020   Procedure: BRONCHIAL BRUSHINGS;  Surgeon: Garner Nash, DO;  Location: [facility];  Service: Pulmonary;;   BRONCHIAL BRUSHINGS  01/14/2021   Procedure: BRONCHIAL BRUSHINGS;  Surgeon: Garner Nash, DO;  Location: [facility];  Service: Pulmonary;;   BRONCHIAL WASHINGS  10/08/2020   Procedure: BRONCHIAL WASHINGS;  Surgeon: Garner Nash, DO;  Location: [facility];  Service: Pulmonary;;   BRONCHIAL WASHINGS  01/14/2021   Procedure: BRONCHIAL WASHINGS;  Surgeon: Garner Nash, DO;  Location: [facility];  Service: Pulmonary;;   ESOPHAGOGASTRODUODENOSCOPY (EGD) WITH PROPOFOL N/A 04/29/2021   Procedure: ESOPHAGOGASTRODUODENOSCOPY (EGD) WITH PROPOFOL;  Surgeon: Milus Banister, MD;  Location: [facility];  Service: Endoscopy;  Laterality: N/A;   FINE NEEDLE ASPIRATION  10/08/2020   Procedure: FINE NEEDLE ASPIRATION (FNA) LINEAR;  Surgeon: Garner Nash, DO;  Location: [facility];  Service: Pulmonary;;   LEFT HEART CATH AND CORONARY ANGIOGRAPHY  N/A 11/07/2017   Procedure: LEFT HEART CATH AND CORONARY ANGIOGRAPHY;  Surgeon: Nelva Bush, MD;  Location: [facility];  Service: Cardiovascular;  Laterality: N/A;   NASAL SINUS SURGERY  1986   VIDEO BRONCHOSCOPY Bilateral 01/29/2019   Procedure: VIDEO BRONCHOSCOPY WITH FLUORO;  Surgeon: Marshell Garfinkel, MD;  Location: [facility];  Service: Cardiopulmonary;  Laterality: Bilateral;   VIDEO BRONCHOSCOPY Right 01/14/2021   Procedure: VIDEO BRONCHOSCOPY WITHOUT FLUORO;  Surgeon: Garner Nash, DO;  Location: [facility];  Service: Pulmonary;  Laterality: Right;  possible cryotherapy   VIDEO BRONCHOSCOPY WITH ENDOBRONCHIAL ULTRASOUND N/A 10/08/2020   Procedure: VIDEO BRONCHOSCOPY WITH ENDOBRONCHIAL ULTRASOUND;  Surgeon: Garner Nash, DO;  Location: [facility];  Service: Pulmonary;  Laterality: N/A;    REVIEW OF SYSTEMS:   Review of Systems  Constitutional: Negative for appetite change, chills, fatigue, fever and unexpected weight change.  HENT:   Negative for mouth sores, nosebleeds, sore throat and trouble swallowing.   Eyes: Negative for eye problems and icterus.  Respiratory: Negative for cough, hemoptysis,  shortness of breath and wheezing.   Cardiovascular: Negative for chest pain and leg swelling.  Gastrointestinal: Negative for abdominal pain, constipation, diarrhea, nausea and vomiting.  Genitourinary: Negative for bladder incontinence, difficulty urinating, dysuria, frequency and hematuria.   Musculoskeletal: Negative for back pain, gait problem, neck pain and neck stiffness.  Skin: Negative for itching and rash.  Neurological: Negative for dizziness, extremity weakness, gait problem, headaches, light-headedness and seizures.  Hematological: Negative for adenopathy. Does not bruise/bleed easily.  Psychiatric/Behavioral: Negative for confusion, depression and sleep disturbance. The patient is not nervous/anxious.     PHYSICAL EXAMINATION:  There were no vitals taken  for this visit.  ECOG PERFORMANCE STATUS: {CHL ONC ECOG [id number]  Physical Exam  Constitutional: Oriented to person, place, and time and well-developed, well-nourished, and in no distress. No distress.  HENT:  Head: Normocephalic and atraumatic.  Mouth/Throat: Oropharynx is clear and moist. No oropharyngeal exudate.  Eyes: Conjunctivae are normal. Right eye exhibits no discharge. Left eye exhibits no discharge. No scleral icterus.  Neck: Normal range of motion. Neck supple.  Cardiovascular: Normal rate, regular rhythm, normal heart sounds and intact distal pulses.   Pulmonary/Chest: Effort normal and breath sounds normal. No respiratory distress. No wheezes. No rales.  Abdominal: Soft. Bowel sounds are normal. Exhibits no distension and no mass. There is no tenderness.  Musculoskeletal: Normal range of motion. Exhibits no edema.  Lymphadenopathy:    No cervical adenopathy.  Neurological: Alert and oriented to person, place, and time. Exhibits normal muscle tone. Gait normal. Coordination normal.  Skin: Skin is warm and dry. No rash noted. Not diaphoretic. No erythema. No pallor.  Psychiatric: Mood, memory and judgment normal.  Vitals reviewed.  LABORATORY DATA: Lab Results  Component Value Date   WBC 6.3 08/26/2021   HGB 10.3 (L) 08/26/2021   HCT 31.5 (L) 08/26/2021   MCV 81.0 08/26/2021   PLT 143 (L) 08/26/2021      Chemistry      Component Value Date/Time   NA 141 08/26/2021 1004   NA 144 02/10/2020 0813   K 3.9 08/26/2021 1004   CL 110 08/26/2021 1004   CO2 25 08/26/2021 1004   BUN 13 08/26/2021 1004   BUN 10 02/10/2020 0813   CREATININE 0.70 08/26/2021 1004   CREATININE 0.96 07/22/2021 0936      Component Value Date/Time   CALCIUM 9.1 08/26/2021 1004   ALKPHOS 99 08/26/2021 1004   AST 23 08/26/2021 1004   ALT 24 08/26/2021 1004   BILITOT 0.3 08/26/2021 1004       RADIOGRAPHIC STUDIES:  No results found.   ASSESSMENT/PLAN:  This is a very pleasant  66 year old Caucasian female diagnosed with stage IIIa (Tx, N2, M0) non-small cell lung cancer, squamous cell carcinoma.  She presented with a subcarinal mass/lymphadenopathy.  She was diagnosed in July 2022.   The patient is not a candidate for surgical resection due to the location of the tumor.   Therefore, the patient completed a course of concurrent chemoradiation with weekly carboplatin for an AUC of 2 and paclitaxel 45 mg per metered square.  She is status post 7 cycles.   She had a repeat CT scan of the chest performed previously which showed improvement in the subcarinal lymph node which is a presentation of her disease.  Also showed collapse of the right middle lobe that was biopsy-proven to be consistent with malignancy  The patient is currently undergoing consolidation immunotherapy with Imfinzi 1500 mg IV every 4 weeks.  She status  post 8 cycles.    Labs were reviewed.  Recommend that she proceed with cycle #10 today as scheduled.   She will continue to follow with her PCP regarding her hypothyroidism.   She will continue to follow with ID for her lung infection  The patient was advised to call immediately if she has any concerning symptoms in the interval. The patient voices understanding of current disease status and treatment options and is in agreement with the current care plan. All questions were answered. The patient knows to call the clinic with any problems, questions or concerns. We can certainly see the patient much sooner if necessary       No orders of the defined types were placed in this encounter.    I spent {CHL ONC TIME VISIT - VDIXV:[id number]} counseling the patient face to face. The total time spent in the appointment was {CHL ONC TIME VISIT - BRKVT:[id number]}.  Meiling Hendriks L Olivia Royse, PA-C 09/21/21

## 2021-09-23 ENCOUNTER — Inpatient Hospital Stay (HOSPITAL_BASED_OUTPATIENT_CLINIC_OR_DEPARTMENT_OTHER): Payer: Medicare Other | Admitting: Physician Assistant

## 2021-09-23 ENCOUNTER — Ambulatory Visit (HOSPITAL_COMMUNITY)
Admission: RE | Admit: 2021-09-23 | Discharge: 2021-09-23 | Disposition: A | Discharge status: Home / Self Care | Payer: Medicare Other | Source: Ambulatory Visit | Attending: Physician Assistant | Admitting: Physician Assistant

## 2021-09-23 ENCOUNTER — Inpatient Hospital Stay: Payer: Medicare Other

## 2021-09-23 ENCOUNTER — Ambulatory Visit: Payer: Medicare Other | Admitting: Internal Medicine

## 2021-09-23 ENCOUNTER — Encounter: Payer: Self-pay | Admitting: Physician Assistant

## 2021-09-23 ENCOUNTER — Other Ambulatory Visit: Payer: Medicare Other

## 2021-09-23 ENCOUNTER — Telehealth: Payer: Self-pay | Admitting: Physician Assistant

## 2021-09-23 ENCOUNTER — Ambulatory Visit: Payer: Medicare Other

## 2021-09-23 ENCOUNTER — Other Ambulatory Visit: Payer: Self-pay

## 2021-09-23 ENCOUNTER — Inpatient Hospital Stay: Payer: Medicare Other | Attending: Physician Assistant

## 2021-09-23 VITALS — BP 128/66 | HR 86 | Temp 98.1°F | Resp 20

## 2021-09-23 VITALS — BP 135/80 | HR 88 | Temp 98.4°F | Resp 18 | Ht 64.0 in | Wt 132.1 lb

## 2021-09-23 DIAGNOSIS — C3491 Malignant neoplasm of unspecified part of right bronchus or lung: Secondary | ICD-10-CM

## 2021-09-23 DIAGNOSIS — Z5112 Encounter for antineoplastic immunotherapy: Secondary | ICD-10-CM | POA: Diagnosis present

## 2021-09-23 DIAGNOSIS — R5382 Chronic fatigue, unspecified: Secondary | ICD-10-CM

## 2021-09-23 DIAGNOSIS — Z79899 Other long term (current) drug therapy: Secondary | ICD-10-CM | POA: Insufficient documentation

## 2021-09-23 DIAGNOSIS — C3401 Malignant neoplasm of right main bronchus: Secondary | ICD-10-CM | POA: Insufficient documentation

## 2021-09-23 LAB — CBC WITH DIFFERENTIAL (CANCER CENTER ONLY)
Abs Immature Granulocytes: 0.01 10*3/uL (ref 0.00–0.07)
Basophils Absolute: 0 10*3/uL (ref 0.0–0.1)
Basophils Relative: 0 %
Eosinophils Absolute: 0.1 10*3/uL (ref 0.0–0.5)
Eosinophils Relative: 1 %
HCT: 31.5 % — ABNORMAL LOW (ref 36.0–46.0)
Hemoglobin: 10.1 g/dL — ABNORMAL LOW (ref 12.0–15.0)
Immature Granulocytes: 0 %
Lymphocytes Relative: 23 %
Lymphs Abs: 1 10*3/uL (ref 0.7–4.0)
MCH: 25.6 pg — ABNORMAL LOW (ref 26.0–34.0)
MCHC: 32.1 g/dL (ref 30.0–36.0)
MCV: 79.7 fL — ABNORMAL LOW (ref 80.0–100.0)
Monocytes Absolute: 0.4 10*3/uL (ref 0.1–1.0)
Monocytes Relative: 8 %
Neutro Abs: 3.1 10*3/uL (ref 1.7–7.7)
Neutrophils Relative %: 68 %
Platelet Count: 154 10*3/uL (ref 150–400)
RBC: 3.95 MIL/uL (ref 3.87–5.11)
RDW: 16 % — ABNORMAL HIGH (ref 11.5–15.5)
WBC Count: 4.6 10*3/uL (ref 4.0–10.5)
nRBC: 0 % (ref 0.0–0.2)

## 2021-09-23 LAB — CMP (CANCER CENTER ONLY)
ALT: 23 U/L (ref 0–44)
AST: 24 U/L (ref 15–41)
Albumin: 4 g/dL (ref 3.5–5.0)
Alkaline Phosphatase: 88 U/L (ref 38–126)
Anion gap: 5 (ref 5–15)
BUN: 13 mg/dL (ref 8–23)
CO2: 27 mmol/L (ref 22–32)
Calcium: 9.3 mg/dL (ref 8.9–10.3)
Chloride: 105 mmol/L (ref 98–111)
Creatinine: 0.71 mg/dL (ref 0.44–1.00)
GFR, Estimated: >60 mL/min (ref >60)
Glucose, Bld: 81 mg/dL (ref 70–99)
Potassium: 4.1 mmol/L (ref 3.5–5.1)
Sodium: 137 mmol/L (ref 135–145)
Total Bilirubin: 0.3 mg/dL (ref 0.3–1.2)
Total Protein: 7 g/dL (ref 6.5–8.1)

## 2021-09-23 LAB — TSH: TSH: 0.243 u[IU]/mL — ABNORMAL LOW (ref 0.350–4.500)

## 2021-09-23 MED ORDER — SODIUM CHLORIDE 0.9 % IV SOLN
1500.0000 mg | Freq: Once | INTRAVENOUS | Status: AC
  Administered 2021-09-23: 1500 mg via INTRAVENOUS
  Filled 2021-09-23: qty 30

## 2021-09-23 MED ORDER — SODIUM CHLORIDE 0.9 % IV SOLN: Freq: Once | INTRAVENOUS | Status: AC

## 2021-09-23 NOTE — Patient Instructions (Signed)
[facility] ONCOLOGY  Discharge Instructions: Thank you for choosing [facility] to provide your oncology and hematology care.   If you have a lab appointment with the [facility], please go directly to the [facility] and check in at the registration area.   Wear comfortable clothing and clothing appropriate for easy access to any Portacath or PICC line.   We strive to give you quality time with your provider. You may need to reschedule your appointment if you arrive late (15 or more minutes).  Arriving late affects you and other patients whose appointments are after yours.  Also, if you miss three or more appointments without notifying the office, you may be dismissed from the clinic at the provider's discretion.      For prescription refill requests, have your pharmacy contact our office and allow 72 hours for refills to be completed.    Today you received the following chemotherapy and/or immunotherapy agent: Durvalumab (Imfinzi)    To help prevent nausea and vomiting after your treatment, we encourage you to take your nausea medication as directed.  BELOW ARE SYMPTOMS THAT SHOULD BE REPORTED IMMEDIATELY: *FEVER GREATER THAN 100.4 F (38 C) OR HIGHER *CHILLS OR SWEATING *NAUSEA AND VOMITING THAT IS NOT CONTROLLED WITH YOUR NAUSEA MEDICATION *UNUSUAL SHORTNESS OF BREATH *UNUSUAL BRUISING OR BLEEDING *URINARY PROBLEMS (pain or burning when urinating, or frequent urination) *BOWEL PROBLEMS (unusual diarrhea, constipation, pain near the anus) TENDERNESS IN MOUTH AND THROAT WITH OR WITHOUT PRESENCE OF ULCERS (sore throat, sores in mouth, or a toothache) UNUSUAL RASH, SWELLING OR PAIN  UNUSUAL VAGINAL DISCHARGE OR ITCHING   Items with * indicate a potential emergency and should be followed up as soon as possible or go to the Emergency Department if any problems should occur.  Please show the CHEMOTHERAPY ALERT CARD or IMMUNOTHERAPY ALERT CARD at  check-in to the Emergency Department and triage nurse.  Should you have questions after your visit or need to cancel or reschedule your appointment, please contact [facility]  Dept: [phone]  and follow the prompts.  Office hours are 8:00 a.m. to 4:30 p.m. Monday - Friday. Please note that voicemails left after 4:00 p.m. may not be returned until the following business day.  We are closed weekends and major holidays. You have access to a nurse at all times for urgent questions. Please call the main number to the clinic Dept: [phone] and follow the prompts.   For any non-urgent questions, you may also contact your provider using MyChart. We now offer e-Visits for anyone 3 and older to request care online for non-urgent symptoms. For details visit [email or web address].   Also download the MyChart app! Go to the app store, search "MyChart", open the app, select [facility], and log in with your MyChart username and password.  Masks are optional in the cancer centers. If you would like for your care team to wear a mask while they are taking care of you, please let them know. For doctor visits, patients may have with them one support person who is at least 68 years old. At this time, visitors are not allowed in the infusion area.

## 2021-09-23 NOTE — Telephone Encounter (Signed)
I saw the patient earlier today who was endorsing 2 weeks of dyspnea on exertion.  She had an x-ray performed today which did not show any acute infections but showed hyperinflation of the lungs likely secondary to her emphysema/COPD.  I called the patient to review this with her.  She states since she is gotten home, she noticed that when she was walking around the hospital to the x-ray department she did not have any more shortness of breath.  She also reports she was able to exercise 30 minutes as per her usual today upon returning home.  Because of the improvement with her shortness of breath, the patient and I decided to hold off on any additional interventions or steroids at this time. In general, we will try and avoid any steroids with her immunotherapy unless absolutely needed.  Encouraged her to continue to be compliant with her COPD medications which are prescribed by pulmonary medicine.  If her symptoms recur, she states she will send me a message on Monday.

## 2021-09-24 ENCOUNTER — Encounter: Payer: Self-pay | Admitting: *Deleted

## 2021-09-27 ENCOUNTER — Ambulatory Visit (INDEPENDENT_AMBULATORY_CARE_PROVIDER_SITE_OTHER): Payer: Medicare Other | Admitting: Medical

## 2021-09-27 VITALS — BP 120/60 | HR 87 | Wt 130.4 lb

## 2021-09-27 DIAGNOSIS — I2510 Atherosclerotic heart disease of native coronary artery without angina pectoris: Secondary | ICD-10-CM

## 2021-09-27 DIAGNOSIS — E039 Hypothyroidism, unspecified: Secondary | ICD-10-CM | POA: Diagnosis not present

## 2021-09-27 DIAGNOSIS — J449 Chronic obstructive pulmonary disease, unspecified: Secondary | ICD-10-CM | POA: Diagnosis not present

## 2021-09-27 DIAGNOSIS — C3491 Malignant neoplasm of unspecified part of right bronchus or lung: Secondary | ICD-10-CM | POA: Diagnosis not present

## 2021-09-27 DIAGNOSIS — E559 Vitamin D deficiency, unspecified: Secondary | ICD-10-CM | POA: Diagnosis not present

## 2021-09-27 MED ORDER — THYROID 90 MG PO TABS: 90.0000 mg | ORAL_TABLET | Freq: Every day | ORAL | 1 refills | Status: DC

## 2021-10-04 LAB — MYCOBACTERIA,CULT W/FLUOROCHROME SMEAR
MICRO NUMBER:: 13414094
SMEAR:: NONE SEEN
SPECIMEN QUALITY:: ADEQUATE

## 2021-10-18 ENCOUNTER — Telehealth: Payer: Self-pay | Admitting: Medical

## 2021-10-18 NOTE — Progress Notes (Signed)
[facility] OFFICE PROGRESS NOTE  Carlena Hurl, PA-C [place] [address]  DIAGNOSIS: Stage IIIA (TX, N2, M0) non-small cell lung cancer, squamous cell carcinoma presented with subcarinal mass/lymphadenopathy diagnosed in July 2022.  PRIOR THERAPY:  Weekly carboplatin for AUC of 2 and paclitaxel 45 mg/m2.  First dose expected on 11/02/2020.  Status post 7 cycles.  Last dose was given 12/14/2020  CURRENT THERAPY:  Consolidation treatment with immunotherapy with Imfinzi 1500 Mg IV every 4 weeks.  First dose 01/14/2021.  Status post 10 cycles.   INTERVAL HISTORY: Kaelah Hayashi 66 y.o. female returns to the clinic today for a follow-up visit. The patient is feeling fairly well today without any concerning complaints. The patient is currently undergoing immunotherapy with Imfinzi.  She tolerates it fairly well except she did have some thyroid dysfunction secondary to her immunotherapy for which her PCP is managing. T4 was drawn today.  Her dose of her Synthroid was recently reduced.  Otherwise, the patient denies any new concerning complaints today.  She denies any recent fever, night sweats, chills, or weight loss.  She denies any unusual cough. She is followed closely by infectious disease and pulmonology for her MAI infection and COPD. At her last appointment she was endorsing increased shortness of breath which has improved. She denies any chest pain or hemoptysis.  Denies any nausea, vomiting, diarrhea, or constipation.  Denies any headache or visual changes.  Denies any rashes.  Except for dry skin for which she uses lotion.  She was supposed have a restaging CT scan prior to her appointment but it has not been scheduled until 11/01/2021.  She is taking an iron supplement daily and has been doing so for ~3 months. She is here today for evaluation and repeat blood work before starting cycle #11.   MEDICAL HISTORY: Past Medical History:  Diagnosis Date   Anxiety     COPD (chronic obstructive pulmonary disease) ([place])    Coronary artery disease    Dental staining 07/15/2019   Depression    History of radiation therapy    Right lung- 11/03/20-12/15/20- Dr. 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  MEDICATIONS:  Current Outpatient Medications  Medication Sig Dispense Refill   acetaminophen (TYLENOL) 500 MG tablet Take 500 mg by mouth every 6 (six) hours as needed for moderate pain or headache.     albuterol (PROVENTIL) (2.5 MG/3ML) 0.083% nebulizer solution Take 3 mLs (2.5 mg total) by nebulization every 6 (six) hours as needed for wheezing or shortness of breath. 540 mL 3   albuterol (VENTOLIN HFA) 108 (90 Base) MCG/ACT inhaler Inhale 2 puffs into the lungs every 6 (six) hours as needed for wheezing or shortness of breath.     aspirin EC 81 MG tablet Take 81 mg by mouth daily with lunch.      buPROPion (WELLBUTRIN XL) 300 MG 24 hr tablet TAKE 1 TABLET DAILY 90 tablet 0   Cholecalciferol (DIALYVITE VITAMIN D 5000) 125 MCG (5000 UT) capsule Take 5,000 Units by mouth daily.     esomeprazole (NEXIUM) 40 MG capsule Take 1 capsule (40 mg total) by mouth daily. 90 capsule 3   Fluticasone-Umeclidin-Vilant (TRELEGY ELLIPTA) 100-62.5-25 MCG/ACT AEPB Inhale 1 puff into the lungs daily. 180 each 3  Guaifenesin (MUCINEX MAXIMUM STRENGTH) 1200 MG TB12 Take 1,200 mg by mouth 2 (two) times daily.     Magnesium 500 MG TABS Take 500 mg by mouth daily with lunch.     Multiple Vitamins-Minerals (IMMUNE SUPPORT PO) Take 2 tablets by mouth daily.     Respiratory Therapy Supplies (FLUTTER) DEVI Use as directed 1 each 0   rosuvastatin (CRESTOR) 20 MG tablet Take 1 tablet (20 mg total) by mouth daily. 90 tablet 3    sodium chloride (OCEAN) 0.65 % SOLN nasal spray Place 1 spray into both nostrils daily as needed for congestion.     temazepam (RESTORIL) 15 MG capsule Take 1 capsule (15 mg total) by mouth at bedtime as needed. 90 capsule 1   thyroid (ARMOUR THYROID) 90 MG tablet Take 1 tablet (90 mg total) by mouth daily. 30 tablet 1   UNABLE TO FIND Med Name: immunotherapy infusion once per month     vitamin B-12 (CYANOCOBALAMIN) 1000 MCG tablet Take 1,000 mcg by mouth daily.     No current facility-administered medications for this visit.   Facility-Administered Medications Ordered in Other Visits  Medication Dose Route Frequency Provider Last Rate Last Admin   Sonafine emulsion 1 application  1 application  Topical Once Gery Pray, MD        SURGICAL HISTORY:  Past Surgical History:  Procedure Laterality Date   BALLOON DILATION N/A 04/29/2021   Procedure: BALLOON DILATION;  Surgeon: Milus Banister, MD;  Location: [facility];  Service: Endoscopy;  Laterality: N/A;   BIOPSY  01/14/2021   Procedure: BIOPSY;  Surgeon: Garner Nash, DO;  Location: [place] ENDOSCOPY;  Service: Pulmonary;;   BRONCHIAL BIOPSY  10/08/2020   Procedure: BRONCHIAL BIOPSIES;  Surgeon: Garner Nash, DO;  Location: [place] ENDOSCOPY;  Service: Pulmonary;;   BRONCHIAL BRUSHINGS  10/08/2020   Procedure: BRONCHIAL BRUSHINGS;  Surgeon: Garner Nash, DO;  Location: [place] ENDOSCOPY;  Service: Pulmonary;;   BRONCHIAL BRUSHINGS  01/14/2021   Procedure: BRONCHIAL BRUSHINGS;  Surgeon: Garner Nash, DO;  Location: [place] END[facility]COPY;  Service: Pulmonary;;   BRONCHIAL WASHINGS  10/08/2020   Procedure: BRONCHIAL WASHINGS;  Surgeon: Garner Nash, DO;  Location: [place] [facility]COPY;  Service: Pulmonary;;   BRONCHIAL WASHINGS  01/14/2021   Procedure: BRONCHIAL WASHINGS;  Surgeon: Garner Nash, DO;  Location: [place] ENDOSCOPY;  Service: Pulmonary;;   ESOPHAGOGASTRODUODENOSCOPY (EGD) WITH PROPOFOL N/A 04/29/2021   Procedure: ESOPHAGOGASTRODUODENOSCOPY  (EGD) WITH PROPOFOL;  Surgeon: Milus Banister, MD;  Location: [facility];  Service: Endoscopy;  Laterality: N/A;   FINE NEEDLE ASPIRATION  10/08/2020   Procedure: FINE NEEDLE ASPIRATION (FNA) LINEAR;  Surgeon: Garner Nash, DO;  Location: [place] ENDOSCOPY;  Service: Pulmonary;;   LEFT HEART CATH AND CORONARY ANGIOGRAPHY N/A 11/07/2017   Procedure: LEFT HEART CATH AND CORONARY ANGIOGRAPHY;  Surgeon: Nelva Bush, MD;  Location: [place] CV LAB;  Service: Cardiovascular;  Laterality: N/A;   NASAL SINUS SURGERY  1986   VIDEO BRONCHOSCOPY Bilateral 01/29/2019   Procedure: VIDEO BRONCHOSCOPY WITH FLUORO;  Surgeon: Marshell Garfinkel, MD;  Location: [place];  Service: Cardiopulmonary;  Laterality: Bilateral;   VIDEO BRONCHOSCOPY Right 01/14/2021   Procedure: VIDEO BRONCHOSCOPY WITHOUT FLUORO;  Surgeon: Garner Nash, DO;  Location: [place];  Service: Pulmonary;  Laterality: Right;  possible cryotherapy   VIDEO BRONCHOSCOPY WITH ENDOBRONCHIAL ULTRASOUND N/A 10/08/2020   Procedure: VIDEO BRONCHOSCOPY WITH ENDOBRONCHIAL ULTRASOUND;  Surgeon: Garner Nash, DO;  Location: [place];  Service: Pulmonary;  Laterality:  N/A;    REVIEW OF SYSTEMS:   Review of Systems  Constitutional: Negative for appetite change, chills, fatigue, fever and unexpected weight change.  HENT:   Negative for mouth sores, nosebleeds, sore throat and trouble swallowing.   Eyes: Negative for eye problems and icterus.  Respiratory: Negative for cough, hemoptysis, shortness of breath and wheezing.   Cardiovascular: Negative for chest pain and leg swelling.  Gastrointestinal: Negative for abdominal pain, constipation, diarrhea, nausea and vomiting.  Genitourinary: Negative for bladder incontinence, difficulty urinating, dysuria, frequency and hematuria.   Musculoskeletal: Negative for back pain, gait problem, neck pain and neck stiffness.  Skin: Negative for itching and rash.  Neurological: Negative for  dizziness, extremity weakness, gait problem, headaches, light-headedness and seizures.  Hematological: Negative for adenopathy. Does not bruise/bleed easily.  Psychiatric/Behavioral: Negative for confusion, depression and sleep disturbance. The patient is not nervous/anxious.     PHYSICAL EXAMINATION:  Blood pressure (!) 162/70, pulse 60, temperature 98 F (36.7 C), temperature source Oral, resp. rate 19, height 5\' 4"  (1.626 m), weight 132 lb 6.4 oz (60.1 kg), SpO2 98 %.  ECOG PERFORMANCE STATUS: 1  Physical Exam  Constitutional: Oriented to person, place, and time and well-developed, well-nourished, and in no distress.  HENT:  Head: Normocephalic and atraumatic.  Mouth/Throat: Oropharynx is clear and moist. No oropharyngeal exudate.  Eyes: Conjunctivae are normal. Right eye exhibits no discharge. Left eye exhibits no discharge. No scleral icterus.  Neck: Normal range of motion. Neck supple.  Cardiovascular: Normal rate, regular rhythm, normal heart sounds and intact distal pulses.   Pulmonary/Chest: Effort normal. Quiet breath sounds bilaterally. No respiratory distress. No rales. Wheezing heard on auscultation. Abdominal: Soft. Bowel sounds are normal. Exhibits no distension and no mass. There is no tenderness.  Musculoskeletal: Normal range of motion. Exhibits no edema.  Lymphadenopathy:    No cervical adenopathy.  Neurological: Alert and oriented to person, place, and time. Exhibits normal muscle tone. Gait normal. Coordination normal.  Skin: Skin is warm and dry. No rash noted. Not diaphoretic. No erythema. No pallor.  Psychiatric: Mood, memory and judgment normal.  Vitals reviewed.  LABORATORY DATA: Lab Results  Component Value Date   WBC 4.7 10/21/2021   HGB 9.6 (L) 10/21/2021   HCT 30.5 (L) 10/21/2021   MCV 79.0 (L) 10/21/2021   PLT 131 (L) 10/21/2021      Chemistry      Component Value Date/Time   NA 137 09/23/2021 0754   NA 144 02/10/2020 0813   K 4.1 09/23/2021  0754   CL 105 09/23/2021 0754   CO2 27 09/23/2021 0754   BUN 13 09/23/2021 0754   BUN 10 02/10/2020 0813   CREATININE 0.71 09/23/2021 0754   CREATININE 0.96 07/22/2021 0936      Component Value Date/Time   CALCIUM 9.3 09/23/2021 0754   ALKPHOS 88 09/23/2021 0754   AST 24 09/23/2021 0754   ALT 23 09/23/2021 0754   BILITOT 0.3 09/23/2021 0754       RADIOGRAPHIC STUDIES:  DG Chest 2 View  Result Date: 09/23/2021 CLINICAL DATA:  Worsening shortness of breath, history of lung cancer and reported history of MAI. EXAM: CHEST - 2 VIEW COMPARISON:  July 30, 2021. FINDINGS: Cardiomediastinal contours and hilar structures are stable. Fullness about the RIGHT hilum is diminished. There is some distortion albeit mild of the RIGHT hilum likely related to prior therapy. No lobar consolidation. No sign of pleural effusion. No visible pneumothorax. Hyperinflation with flattening of RIGHT and LEFT hemidiaphragm.  On limited assessment there is no acute skeletal finding. IMPRESSION: 1. Hyperinflation. No acute cardiopulmonary disease. 2. Diminished fullness about the RIGHT hilum likely related to improving appearance of post treatment changes Electronically Signed   By: Zetta Bills M.D.   On: 09/23/2021 11:56     ASSESSMENT/PLAN:  This is a very pleasant 66 year old Caucasian female diagnosed with stage IIIa (Tx, N2, M0) non-small cell lung cancer, squamous cell carcinoma.  She presented with a subcarinal mass/lymphadenopathy.  She was diagnosed in July 2022.   The patient is not a candidate for surgical resection due to the location of the tumor.   Therefore, the patient completed a course of concurrent chemoradiation with weekly carboplatin for an AUC of 2 and paclitaxel 45 mg per metered square.  She is status post 7 cycles.   She had a repeat CT scan of the chest performed previously which showed improvement in the subcarinal lymph node which is a presentation of her disease.  Also showed  collapse of the right middle lobe that was biopsy-proven to be consistent with malignancy   The patient is currently undergoing consolidation immunotherapy with Imfinzi 1500 mg IV every 4 weeks.  She status post 10 cycles.    Labs were reviewed. Her CMP is still pending. As long as this is within parameters, recommend that she proceed with cycle #11 today as scheduled.    She will continue to follow with her PCP regarding her hypothyroidism.    She will continue to follow with ID for her lung infection and pulmonologist.   I will call her with the results of her CT scan. Of course, if there are any concerns we will arrange for her to be seen sooner.   She will continue to take her iron supplement  The patient was advised to call immediately if she has any concerning symptoms in the interval. The patient voices understanding of current disease status and treatment options and is in agreement with the current care plan. All questions were answered. The patient knows to call the clinic with any problems, questions or concerns. We can certainly see the patient much sooner if necessary    No orders of the defined types were placed in this encounter.     The total time spent in the appointment was 20-29 minutes.   Arrie Zuercher L Biff Rutigliano, PA-C 10/21/21

## 2021-10-18 NOTE — Telephone Encounter (Signed)
Spoke with patient to schedule Medicare Annual Wellness Visit (AWV) either virtually or in office. I left my number for patient to call [phone].  She stated she has a lot going on and would like a call back in sept  awvi 09/03/19 per [place] please schedule at anytime with health coach  This should be a 45 minute visit.

## 2021-10-21 ENCOUNTER — Inpatient Hospital Stay (HOSPITAL_BASED_OUTPATIENT_CLINIC_OR_DEPARTMENT_OTHER): Payer: Medicare Other | Admitting: Physician Assistant

## 2021-10-21 ENCOUNTER — Inpatient Hospital Stay: Payer: Medicare Other

## 2021-10-21 ENCOUNTER — Other Ambulatory Visit: Payer: Self-pay

## 2021-10-21 ENCOUNTER — Telehealth: Payer: Self-pay | Admitting: Medical

## 2021-10-21 ENCOUNTER — Other Ambulatory Visit: Payer: Self-pay | Admitting: Medical

## 2021-10-21 ENCOUNTER — Inpatient Hospital Stay: Payer: Medicare Other | Attending: Physician Assistant

## 2021-10-21 VITALS — BP 162/70 | HR 60 | Temp 98.0°F | Resp 19 | Ht 64.0 in | Wt 132.4 lb

## 2021-10-21 DIAGNOSIS — Z5112 Encounter for antineoplastic immunotherapy: Secondary | ICD-10-CM | POA: Insufficient documentation

## 2021-10-21 DIAGNOSIS — C3491 Malignant neoplasm of unspecified part of right bronchus or lung: Secondary | ICD-10-CM

## 2021-10-21 DIAGNOSIS — C3401 Malignant neoplasm of right main bronchus: Secondary | ICD-10-CM | POA: Diagnosis present

## 2021-10-21 DIAGNOSIS — Z79899 Other long term (current) drug therapy: Secondary | ICD-10-CM | POA: Insufficient documentation

## 2021-10-21 DIAGNOSIS — R5382 Chronic fatigue, unspecified: Secondary | ICD-10-CM

## 2021-10-21 LAB — CBC WITH DIFFERENTIAL (CANCER CENTER ONLY)
Abs Immature Granulocytes: 0.02 10*3/uL (ref 0.00–0.07)
Basophils Absolute: 0 10*3/uL (ref 0.0–0.1)
Basophils Relative: 0 %
Eosinophils Absolute: 0.1 10*3/uL (ref 0.0–0.5)
Eosinophils Relative: 1 %
HCT: 30.5 % — ABNORMAL LOW (ref 36.0–46.0)
Hemoglobin: 9.6 g/dL — ABNORMAL LOW (ref 12.0–15.0)
Immature Granulocytes: 0 %
Lymphocytes Relative: 18 %
Lymphs Abs: 0.9 10*3/uL (ref 0.7–4.0)
MCH: 24.9 pg — ABNORMAL LOW (ref 26.0–34.0)
MCHC: 31.5 g/dL (ref 30.0–36.0)
MCV: 79 fL — ABNORMAL LOW (ref 80.0–100.0)
Monocytes Absolute: 0.3 10*3/uL (ref 0.1–1.0)
Monocytes Relative: 7 %
Neutro Abs: 3.5 10*3/uL (ref 1.7–7.7)
Neutrophils Relative %: 74 %
Platelet Count: 131 10*3/uL — ABNORMAL LOW (ref 150–400)
RBC: 3.86 MIL/uL — ABNORMAL LOW (ref 3.87–5.11)
RDW: 16.5 % — ABNORMAL HIGH (ref 11.5–15.5)
WBC Count: 4.7 10*3/uL (ref 4.0–10.5)
nRBC: 0 % (ref 0.0–0.2)

## 2021-10-21 LAB — CMP (CANCER CENTER ONLY)
ALT: 30 U/L (ref 0–44)
AST: 31 U/L (ref 15–41)
Albumin: 4.2 g/dL (ref 3.5–5.0)
Alkaline Phosphatase: 92 U/L (ref 38–126)
Anion gap: 6 (ref 5–15)
BUN: 9 mg/dL (ref 8–23)
CO2: 25 mmol/L (ref 22–32)
Calcium: 8.9 mg/dL (ref 8.9–10.3)
Chloride: 105 mmol/L (ref 98–111)
Creatinine: 0.67 mg/dL (ref 0.44–1.00)
GFR, Estimated: >60 mL/min (ref >60)
Glucose, Bld: 89 mg/dL (ref 70–99)
Potassium: 3.7 mmol/L (ref 3.5–5.1)
Sodium: 136 mmol/L (ref 135–145)
Total Bilirubin: 0.3 mg/dL (ref 0.3–1.2)
Total Protein: 6.8 g/dL (ref 6.5–8.1)

## 2021-10-21 LAB — TSH: TSH: 9.098 u[IU]/mL — ABNORMAL HIGH (ref 0.350–4.500)

## 2021-10-21 MED ORDER — SODIUM CHLORIDE 0.9 % IV SOLN: Freq: Once | INTRAVENOUS | Status: AC

## 2021-10-21 MED ORDER — SODIUM CHLORIDE 0.9 % IV SOLN
1500.0000 mg | Freq: Once | INTRAVENOUS | Status: AC
  Administered 2021-10-21: 1500 mg via INTRAVENOUS
  Filled 2021-10-21: qty 30

## 2021-10-21 NOTE — Patient Instructions (Signed)
[facility]  Discharge Instructions: Thank you for choosing [facility] to provide your oncology and hematology care.   If you have a lab appointment with the [facility], please go directly to the [facility] and check in at the registration area.   Wear comfortable clothing and clothing appropriate for easy access to any Portacath or PICC line.   We strive to give you quality time with your provider. You may need to reschedule your appointment if you arrive late (15 or more minutes).  Arriving late affects you and other patients whose appointments are after yours.  Also, if you miss three or more appointments without notifying the office, you may be dismissed from the clinic at the provider's discretion.      For prescription refill requests, have your pharmacy contact our office and allow 72 hours for refills to be completed.    Today you received the following chemotherapy and/or immunotherapy agents: durvalumab      To help prevent nausea and vomiting after your treatment, we encourage you to take your nausea medication as directed.  BELOW ARE SYMPTOMS THAT SHOULD BE REPORTED IMMEDIATELY: *FEVER GREATER THAN 100.4 F (38 C) OR HIGHER *CHILLS OR SWEATING *NAUSEA AND VOMITING THAT IS NOT CONTROLLED WITH YOUR NAUSEA MEDICATION *UNUSUAL SHORTNESS OF BREATH *UNUSUAL BRUISING OR BLEEDING *URINARY PROBLEMS (pain or burning when urinating, or frequent urination) *BOWEL PROBLEMS (unusual diarrhea, constipation, pain near the anus) TENDERNESS IN MOUTH AND THROAT WITH OR WITHOUT PRESENCE OF ULCERS (sore throat, sores in mouth, or a toothache) UNUSUAL RASH, SWELLING OR PAIN  UNUSUAL VAGINAL DISCHARGE OR ITCHING   Items with * indicate a potential emergency and should be followed up as soon as possible or go to the Emergency Department if any problems should occur.  Please show the CHEMOTHERAPY ALERT CARD or IMMUNOTHERAPY ALERT CARD at check-in to  the Emergency Department and triage nurse.  Should you have questions after your visit or need to cancel or reschedule your appointment, please contact [facility]  Dept: [phone]  and follow the prompts.  Office hours are 8:00 a.m. to 4:30 p.m. Monday - Friday. Please note that voicemails left after 4:00 p.m. may not be returned until the following business day.  We are closed weekends and major holidays. You have access to a nurse at all times for urgent questions. Please call the main number to the clinic Dept: [phone] and follow the prompts.   For any non-urgent questions, you may also contact your provider using MyChart. We now offer e-Visits for anyone 64 and older to request care online for non-urgent symptoms. For details visit [email or web address].   Also download the MyChart app! Go to the app store, search "MyChart", open the app, select , and log in with your MyChart username and password.  Masks are optional in the cancer centers. If you would like for your care team to wear a mask while they are taking care of you, please let them know. For doctor visits, patients may have with them one support person who is at least 68 years old. At this time, visitors are not allowed in the infusion area.

## 2021-10-21 NOTE — Telephone Encounter (Signed)
I got her thyroid labs today and she is undercorrected again.  I do not think the Armour Thyroid is working very well or it is too hard to get it to even out  Looking back in all the labs in the last several months she seems to have the closest levels to normal after she started the 88 mcg of levothyroxine  She did have headaches with one of the things we tried at one point  I would like to go back to levothyroxine at either 34mcg or 100 mcg for the next few weeks and see how she does on this.  I am also going to place referral to endocrinology to get their assistance.  See if agreeable to going back to levothyroxine for the time being and stopping the Armour Thyroid

## 2021-10-21 NOTE — Telephone Encounter (Signed)
Pt was notified but she would like to wait to decide until her T4 comes back. They added that on so she hopes will come back soon

## 2021-10-22 ENCOUNTER — Other Ambulatory Visit: Payer: Self-pay | Admitting: Medical

## 2021-10-22 LAB — T4, FREE: Free T4: 0.78 ng/dL — ABNORMAL LOW (ref 0.82–1.77)

## 2021-10-22 MED ORDER — LEVOTHYROXINE SODIUM 88 MCG PO TABS: 88.0000 ug | ORAL_TABLET | Freq: Every day | ORAL | 1 refills | Status: DC

## 2021-10-25 ENCOUNTER — Other Ambulatory Visit: Payer: Self-pay

## 2021-10-31 ENCOUNTER — Other Ambulatory Visit: Payer: Self-pay | Admitting: Medical

## 2021-11-01 ENCOUNTER — Telehealth: Payer: Self-pay | Admitting: Family Medicine

## 2021-11-01 ENCOUNTER — Ambulatory Visit (HOSPITAL_COMMUNITY)
Admission: RE | Admit: 2021-11-01 | Discharge: 2021-11-01 | Disposition: A | Discharge status: Home / Self Care | Payer: Medicare Other | Source: Ambulatory Visit | Attending: Physician Assistant | Admitting: Physician Assistant

## 2021-11-01 DIAGNOSIS — C3491 Malignant neoplasm of unspecified part of right bronchus or lung: Secondary | ICD-10-CM | POA: Insufficient documentation

## 2021-11-01 MED ORDER — IOHEXOL 300 MG/ML  SOLN
75.0000 mL | Freq: Once | INTRAMUSCULAR | Status: AC | PRN
  Administered 2021-11-01: 75 mL via INTRAVENOUS

## 2021-11-01 MED ORDER — SODIUM CHLORIDE (PF) 0.9 % IJ SOLN
INTRAMUSCULAR | Status: AC
  Filled 2021-11-01: qty 50

## 2021-11-01 NOTE — Telephone Encounter (Signed)
Sympathy card sent

## 2021-11-02 ENCOUNTER — Other Ambulatory Visit: Payer: Self-pay

## 2021-11-02 LAB — HM MAMMOGRAPHY

## 2021-11-05 ENCOUNTER — Telehealth: Payer: Self-pay | Admitting: Internal Medicine

## 2021-11-05 NOTE — Telephone Encounter (Signed)
Abstracted mammogram

## 2021-11-09 ENCOUNTER — Encounter: Payer: Self-pay | Admitting: Medical

## 2021-11-17 ENCOUNTER — Other Ambulatory Visit: Payer: Self-pay | Admitting: Medical

## 2021-11-17 MED ORDER — TEMAZEPAM 15 MG PO CAPS: 15.0000 mg | ORAL_CAPSULE | Freq: Every evening | ORAL | 1 refills | Status: DC | PRN

## 2021-11-18 ENCOUNTER — Inpatient Hospital Stay: Payer: Medicare Other

## 2021-11-18 ENCOUNTER — Inpatient Hospital Stay: Payer: Medicare Other | Attending: Physician Assistant | Admitting: Internal Medicine

## 2021-11-18 ENCOUNTER — Other Ambulatory Visit: Payer: Self-pay | Admitting: Internal Medicine

## 2021-11-18 ENCOUNTER — Encounter: Payer: Self-pay | Admitting: Internal Medicine

## 2021-11-18 ENCOUNTER — Other Ambulatory Visit: Payer: Self-pay

## 2021-11-18 VITALS — BP 130/100 | HR 66 | Temp 97.9°F | Resp 18

## 2021-11-18 VITALS — BP 150/78 | HR 78 | Temp 98.0°F | Resp 17 | Wt 134.2 lb

## 2021-11-18 DIAGNOSIS — C3401 Malignant neoplasm of right main bronchus: Secondary | ICD-10-CM | POA: Diagnosis present

## 2021-11-18 DIAGNOSIS — C3491 Malignant neoplasm of unspecified part of right bronchus or lung: Secondary | ICD-10-CM

## 2021-11-18 DIAGNOSIS — Z5112 Encounter for antineoplastic immunotherapy: Secondary | ICD-10-CM | POA: Diagnosis present

## 2021-11-18 DIAGNOSIS — E039 Hypothyroidism, unspecified: Secondary | ICD-10-CM | POA: Insufficient documentation

## 2021-11-18 DIAGNOSIS — R5382 Chronic fatigue, unspecified: Secondary | ICD-10-CM

## 2021-11-18 DIAGNOSIS — Z79899 Other long term (current) drug therapy: Secondary | ICD-10-CM | POA: Diagnosis not present

## 2021-11-18 LAB — CBC WITH DIFFERENTIAL (CANCER CENTER ONLY)
Abs Immature Granulocytes: 0.01 10*3/uL (ref 0.00–0.07)
Basophils Absolute: 0 10*3/uL (ref 0.0–0.1)
Basophils Relative: 0 %
Eosinophils Absolute: 0 10*3/uL (ref 0.0–0.5)
Eosinophils Relative: 1 %
HCT: 30.2 % — ABNORMAL LOW (ref 36.0–46.0)
Hemoglobin: 9.5 g/dL — ABNORMAL LOW (ref 12.0–15.0)
Immature Granulocytes: 0 %
Lymphocytes Relative: 21 %
Lymphs Abs: 1.1 10*3/uL (ref 0.7–4.0)
MCH: 24.2 pg — ABNORMAL LOW (ref 26.0–34.0)
MCHC: 31.5 g/dL (ref 30.0–36.0)
MCV: 76.8 fL — ABNORMAL LOW (ref 80.0–100.0)
Monocytes Absolute: 0.4 10*3/uL (ref 0.1–1.0)
Monocytes Relative: 7 %
Neutro Abs: 3.6 10*3/uL (ref 1.7–7.7)
Neutrophils Relative %: 71 %
Platelet Count: 131 10*3/uL — ABNORMAL LOW (ref 150–400)
RBC: 3.93 MIL/uL (ref 3.87–5.11)
RDW: 17.1 % — ABNORMAL HIGH (ref 11.5–15.5)
WBC Count: 5.2 10*3/uL (ref 4.0–10.5)
nRBC: 0 % (ref 0.0–0.2)

## 2021-11-18 LAB — TSH: TSH: 18.608 u[IU]/mL — ABNORMAL HIGH (ref 0.350–4.500)

## 2021-11-18 LAB — CMP (CANCER CENTER ONLY)
ALT: 26 U/L (ref 0–44)
AST: 29 U/L (ref 15–41)
Albumin: 4.3 g/dL (ref 3.5–5.0)
Alkaline Phosphatase: 87 U/L (ref 38–126)
Anion gap: 4 — ABNORMAL LOW (ref 5–15)
BUN: 16 mg/dL (ref 8–23)
CO2: 26 mmol/L (ref 22–32)
Calcium: 8.8 mg/dL — ABNORMAL LOW (ref 8.9–10.3)
Chloride: 106 mmol/L (ref 98–111)
Creatinine: 0.74 mg/dL (ref 0.44–1.00)
GFR, Estimated: >60 mL/min (ref >60)
Glucose, Bld: 79 mg/dL (ref 70–99)
Potassium: 3.9 mmol/L (ref 3.5–5.1)
Sodium: 136 mmol/L (ref 135–145)
Total Bilirubin: 0.3 mg/dL (ref 0.3–1.2)
Total Protein: 6.9 g/dL (ref 6.5–8.1)

## 2021-11-18 MED ORDER — SODIUM CHLORIDE 0.9 % IV SOLN: Freq: Once | INTRAVENOUS | Status: AC

## 2021-11-18 MED ORDER — SODIUM CHLORIDE 0.9 % IV SOLN
1500.0000 mg | Freq: Once | INTRAVENOUS | Status: AC
  Administered 2021-11-18: 1500 mg via INTRAVENOUS
  Filled 2021-11-18: qty 30

## 2021-11-18 NOTE — Progress Notes (Signed)
[facility] [phone]   [phone]  OFFICE PROGRESS NOTE  Carlena Hurl, PA-C [address] [place] [address]  DIAGNOSIS: Stage IIIA (TX, N2, M0) non-small cell lung cancer, squamous cell carcinoma presented with subcarinal mass/lymphadenopathy diagnosed in July 2022.   PRIOR THERAPY: Weekly carboplatin for AUC of 2 and paclitaxel 45 mg/m2.  First dose expected on 11/02/2020.  Status post 7 cycles.  Last dose was given 12/14/2020   CURRENT THERAPY: Consolidation treatment with immunotherapy with Imfinzi 1500 Mg IV every 4 weeks.  First dose 01/14/2021.  Status post 11 cycles  INTERVAL HISTORY: Katrina Campbell 66 y.o. female returns to the clinic today for follow-up visit.  The patient is feeling fine today with no concerning complaints except for mild shortness of breath with exertion but she denied having any chest pain, cough or hemoptysis.  She has no nausea, vomiting, diarrhea or constipation.  She has no headache or visual changes.  She has no recent weight loss or night sweats.  She has some issues with the regulation of her hypothyroidism but this is handled by his primary care provider. The patient is here today for evaluation before starting cycle #12 of her treatment.   MEDICAL HISTORY: Past Medical History:  Diagnosis Date   Anxiety    COPD (chronic obstructive pulmonary disease) ([place])    Coronary artery disease    Dental staining 07/15/2019   Depression    History of radiation therapy    Right lung- 11/03/20-12/15/20- Dr. 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  MEDICATIONS:  Current Outpatient Medications  Medication Sig Dispense Refill   acetaminophen  (TYLENOL) 500 MG tablet Take 500 mg by mouth every 6 (six) hours as needed for moderate pain or headache.     albuterol (PROVENTIL) (2.5 MG/3ML) 0.083% nebulizer solution Take 3 mLs (2.5 mg total) by nebulization every 6 (six) hours as needed for wheezing or shortness of breath. 540 mL 3   albuterol (VENTOLIN HFA) 108 (90 Base) MCG/ACT inhaler Inhale 2 puffs into the lungs every 6 (six) hours as needed for wheezing or shortness of breath.     aspirin EC 81 MG tablet Take 81 mg by mouth daily with lunch.      buPROPion (WELLBUTRIN XL) 300 MG 24 hr tablet TAKE 1 TABLET DAILY 90 tablet 1   Cholecalciferol (DIALYVITE VITAMIN D 5000) 125 MCG (5000 UT) capsule Take 5,000 Units by mouth daily.     esomeprazole (NEXIUM) 40 MG capsule Take 1 capsule (40 mg total) by mouth daily. 90 capsule 3   Fluticasone-Umeclidin-Vilant (TRELEGY ELLIPTA) 100-62.5-25 MCG/ACT AEPB Inhale 1 puff into the lungs daily. 180 each 3   Guaifenesin (MUCINEX MAXIMUM STRENGTH) 1200 MG TB12 Take 1,200 mg by mouth 2 (two) times daily.     levothyroxine (SYNTHROID) 88 MCG tablet Take 1 tablet (88 mcg total) by mouth daily. 30 tablet 1   Magnesium 500 MG TABS Take 500 mg by mouth daily with lunch.     Multiple Vitamins-Minerals (IMMUNE SUPPORT PO) Take 2 tablets by mouth daily.     Respiratory Therapy Supplies (FLUTTER) DEVI Use as directed 1 each 0   rosuvastatin (CRESTOR) 20 MG tablet Take 1 tablet (20 mg total) by mouth daily. [place]  tablet 3   sodium chloride (OCEAN) 0.65 % SOLN nasal spray Place 1 spray into both nostrils daily as needed for congestion.     temazepam (RESTORIL) 15 MG capsule Take 1 capsule (15 mg total) by mouth at bedtime as needed. 90 capsule 1   UNABLE TO FIND Med Name: immunotherapy infusion once per month     vitamin B-12 (CYANOCOBALAMIN) 1000 MCG tablet Take 1,000 mcg by mouth daily.     No current facility-administered medications for this visit.   Facility-Administered Medications Ordered in Other Visits   Medication Dose Route Frequency Provider Last Rate Last Admin   Sonafine emulsion 1 application  1 application  Topical Once Gery Pray, MD        SURGICAL HISTORY:  Past Surgical History:  Procedure Laterality Date   BALLOON DILATION N/A 04/29/2021   Procedure: BALLOON DILATION;  Surgeon: Milus Banister, MD;  Location: [facility];  Service: Endoscopy;  Laterality: N/A;   BIOPSY  01/14/2021   Procedure: BIOPSY;  Surgeon: Garner Nash, DO;  Location: [facility];  Service: Pulmonary;;   BRONCHIAL BIOPSY  10/08/2020   Procedure: BRONCHIAL BIOPSIES;  Surgeon: Garner Nash, DO;  Location: [facility];  Service: Pulmonary;;   BRONCHIAL BRUSHINGS  10/08/2020   Procedure: BRONCHIAL BRUSHINGS;  Surgeon: Garner Nash, DO;  Location: [facility];  Service: Pulmonary;;   BRONCHIAL BRUSHINGS  01/14/2021   Procedure: BRONCHIAL BRUSHINGS;  Surgeon: Garner Nash, DO;  Location: [facility];  Service: Pulmonary;;   BRONCHIAL WASHINGS  10/08/2020   Procedure: BRONCHIAL WASHINGS;  Surgeon: Garner Nash, DO;  Location: [facility];  Service: Pulmonary;;   BRONCHIAL WASHINGS  01/14/2021   Procedure: BRONCHIAL WASHINGS;  Surgeon: Garner Nash, DO;  Location: [facility];  Service: Pulmonary;;   ESOPHAGOGASTRODUODENOSCOPY (EGD) WITH PROPOFOL N/A 04/29/2021   Procedure: ESOPHAGOGASTRODUODENOSCOPY (EGD) WITH PROPOFOL;  Surgeon: Milus Banister, MD;  Location: [facility];  Service: Endoscopy;  Laterality: N/A;   FINE NEEDLE ASPIRATION  10/08/2020   Procedure: FINE NEEDLE ASPIRATION (FNA) LINEAR;  Surgeon: Garner Nash, DO;  Location: [facility];  Service: Pulmonary;;   LEFT HEART CATH AND CORONARY ANGIOGRAPHY N/A 11/07/2017   Procedure: LEFT HEART CATH AND CORONARY ANGIOGRAPHY;  Surgeon: Nelva Bush, MD;  Location: [facility];  Service: Cardiovascular;  Laterality: N/A;   NASAL SINUS SURGERY  1986   VIDEO BRONCHOSCOPY Bilateral 01/29/2019   Procedure: VIDEO  BRONCHOSCOPY WITH FLUORO;  Surgeon: Marshell Garfinkel, MD;  Location: [facility];  Service: Cardiopulmonary;  Laterality: Bilateral;   VIDEO BRONCHOSCOPY Right 01/14/2021   Procedure: VIDEO BRONCHOSCOPY WITHOUT FLUORO;  Surgeon: Garner Nash, DO;  Location: [facility];  Service: Pulmonary;  Laterality: Right;  possible cryotherapy   VIDEO BRONCHOSCOPY WITH ENDOBRONCHIAL ULTRASOUND N/A 10/08/2020   Procedure: VIDEO BRONCHOSCOPY WITH ENDOBRONCHIAL ULTRASOUND;  Surgeon: Garner Nash, DO;  Location: [facility];  Service: Pulmonary;  Laterality: N/A;    REVIEW OF SYSTEMS:  A comprehensive review of systems was negative except for: Respiratory: positive for dyspnea on exertion   PHYSICAL EXAMINATION: General appearance: alert, cooperative, and no distress Head: Normocephalic, without obvious abnormality, atraumatic Neck: no adenopathy, no JVD, supple, symmetrical, trachea midline, and thyroid not enlarged, symmetric, no tenderness/mass/nodules Lymph nodes: Cervical, supraclavicular, and axillary nodes normal. Resp: clear to auscultation bilaterally Back: symmetric, no curvature. ROM normal. No CVA tenderness. Cardio: regular rate and rhythm, S1, S2 normal, no murmur, click, rub or gallop GI: soft, non-tender; bowel sounds normal; no  masses,  no organomegaly Extremities: extremities normal, atraumatic, no cyanosis or edema  ECOG PERFORMANCE STATUS: 1 - Symptomatic but completely ambulatory  Blood pressure (!) 150/78, pulse 78, temperature 98 F (36.7 C), temperature source Oral, resp. rate 17, weight 134 lb 3 oz (60.9 kg), SpO2 97 %.  LABORATORY DATA: Lab Results  Component Value Date   WBC 5.2 11/18/2021   HGB 9.5 (L) 11/18/2021   HCT 30.2 (L) 11/18/2021   MCV 76.8 (L) 11/18/2021   PLT 131 (L) 11/18/2021      Chemistry      Component Value Date/Time   NA 136 10/21/2021 0759   NA 144 02/10/2020 0813   K 3.7 10/21/2021 0759   CL 105 10/21/2021 0759   CO2 25 10/21/2021 0759    BUN 9 10/21/2021 0759   BUN 10 02/10/2020 0813   CREATININE 0.67 10/21/2021 0759   CREATININE 0.96 07/22/2021 0936      Component Value Date/Time   CALCIUM 8.9 10/21/2021 0759   ALKPHOS 92 10/21/2021 0759   AST 31 10/21/2021 0759   ALT 30 10/21/2021 0759   BILITOT 0.3 10/21/2021 0759       RADIOGRAPHIC STUDIES: CT Chest W Contrast  Result Date: 11/01/2021 CLINICAL DATA:  Lung cancer, assess treatment response * Tracking Code: BO * EXAM: CT CHEST WITH CONTRAST TECHNIQUE: Multidetector CT imaging of the chest was performed during intravenous contrast administration. RADIATION DOSE REDUCTION: This exam was performed according to the departmental dose-optimization program which includes automated exposure control, adjustment of the mA and/or kV according to patient size and/or use of iterative reconstruction technique. CONTRAST:  61mL OMNIPAQUE IOHEXOL 300 MG/ML  SOLN COMPARISON:  07/30/2021 FINDINGS: Cardiovascular: Aortic atherosclerosis. Tubular ascending thoracic aorta measures 3.7 x 3.6 cm. Normal heart size. Left coronary artery calcifications. Trace pericardial effusion. Mediastinum/Nodes: Unchanged, matted post treatment appearance subcarinal and right hilar lymph nodes (series 2, image 56). No discretely enlarged mediastinal, hilar, or axillary lymph nodes. Thyroid gland, trachea, and esophagus demonstrate no significant findings. Lungs/Pleura: Moderate to severe centrilobular and paraseptal emphysema. Unchanged complete atelectasis or consolidation of the right upper lobe (series 7, image 41). Interval resolution of a previously seen irregular nodule of the anterior inferior left lower lobe (series 7, image 121). No pleural effusion or pneumothorax. Upper Abdomen: No acute abnormality. Unchanged elongated hypodense lesion of the posterior liver dome measuring 1.1 x 0.4 cm (series 2, image 139). Unchanged subcentimeter lesion of the right lobe of the liver measuring 0.5 cm (series 2, image  160). Focal fatty deposition adjacent to the falciform ligament (series 2, image 173). Musculoskeletal: No chest wall abnormality. No suspicious osseous lesions identified. IMPRESSION: 1. Unchanged complete atelectasis or consolidation of the right upper lobe. 2. Interval resolution of a previously seen irregular nodule of the anterior inferior left lower lobe consistent with resolution of nonspecific infection or inflammation. 3. Unchanged, matted post treatment appearance subcarinal and right hilar lymph nodes. No discretely enlarged mediastinal, hilar, or axillary lymph nodes. 4. Emphysema. 5. Coronary artery disease. Aortic Atherosclerosis (ICD10-I70.0) and Emphysema (ICD10-J43.9). Electronically Signed   By: Delanna Ahmadi M.D.   On: 11/01/2021 08:54    ASSESSMENT AND PLAN: This is a very pleasant 66 years old white female recently diagnosed with a stage IIIA (TX, N2, M0) non-small cell lung cancer, squamous cell carcinoma presented with subcarinal mass/lymphadenopathy diagnosed in July 2022. The patient underwent a course of concurrent chemoradiation with weekly carboplatin for AUC of 2 and paclitaxel 45 Mg/M2 status post 7 cycles.  The patient tolerated her treatment well except for fatigue and sore throat. The patient is currently undergoing consolidation treatment with immunotherapy with Imfinzi 1500 Mg IV every 4 weeks status post 11 cycles.   The patient continues to tolerate this treatment well with no concerning adverse effects. I recommended for her to proceed with cycle #12 today as planned. She will come back for follow-up visit in 4 weeks for evaluation before the last cycle of her treatment. For the hypothyroidism, she is currently on Synthroid by her primary care physician and he is adjusting her medication. The patient was advised to call immediately if she has any other concerning symptoms in the interval.  The patient voices understanding of current disease status and treatment  options and is in agreement with the current care plan.  All questions were answered. The patient knows to call the clinic with any problems, questions or concerns. We can certainly see the patient much sooner if necessary.  Disclaimer: This note was dictated with voice recognition software. Similar sounding words can inadvertently be transcribed and may not be corrected upon review.

## 2021-11-19 ENCOUNTER — Other Ambulatory Visit: Payer: Self-pay | Admitting: Medical

## 2021-11-19 MED ORDER — LEVOTHYROXINE SODIUM 100 MCG PO TABS: 100.0000 ug | ORAL_TABLET | Freq: Every day | ORAL | 1 refills | Status: DC

## 2021-12-09 ENCOUNTER — Telehealth: Payer: Self-pay | Admitting: Medical

## 2021-12-09 NOTE — Telephone Encounter (Signed)
Spoke with patient to schedule Medicare Annual Wellness Visit (AWV) either virtually or in office.  Patient stated she wanted to wait till after 1st of year  awvi 09/03/19 per [facility]  ; please schedule at anytime with health coach  This should be a 45 minute visit.

## 2021-12-10 ENCOUNTER — Ambulatory Visit (HOSPITAL_COMMUNITY): Payer: Medicare Other | Attending: Cardiology

## 2021-12-10 DIAGNOSIS — Z9221 Personal history of antineoplastic chemotherapy: Secondary | ICD-10-CM | POA: Diagnosis present

## 2021-12-10 DIAGNOSIS — I2510 Atherosclerotic heart disease of native coronary artery without angina pectoris: Secondary | ICD-10-CM | POA: Diagnosis present

## 2021-12-10 LAB — ECHOCARDIOGRAM COMPLETE
Area-P 1/2: 3.45 cm2
S' Lateral: 3.1 cm

## 2021-12-13 ENCOUNTER — Telehealth: Payer: Self-pay | Admitting: *Deleted

## 2021-12-13 DIAGNOSIS — Z79899 Other long term (current) drug therapy: Secondary | ICD-10-CM

## 2021-12-13 DIAGNOSIS — E785 Hyperlipidemia, unspecified: Secondary | ICD-10-CM

## 2021-12-13 DIAGNOSIS — I2510 Atherosclerotic heart disease of native coronary artery without angina pectoris: Secondary | ICD-10-CM

## 2021-12-13 DIAGNOSIS — E781 Pure hyperglyceridemia: Secondary | ICD-10-CM

## 2021-12-13 DIAGNOSIS — E782 Mixed hyperlipidemia: Secondary | ICD-10-CM

## 2021-12-13 NOTE — Telephone Encounter (Signed)
-----   Message from Freada Bergeron, MD sent at 12/11/2021  7:27 PM EDT ----- Her echo looks great with normal pumping function. Her aortic valve leaks a mild amount which we will just monitor with echoes every 3 years going forward. Overall this is great news!

## 2021-12-13 NOTE — Telephone Encounter (Signed)
The patient has been notified of the result and verbalized understanding.  All questions (if any) were answered.  Pt did request to have her lipids checked this Wednesday 9/13, prior to seeing Dr. Johney Frame in follow-up on 9/15.  She wants to see how her levels are since being on treatments.   Scheduled the pt to come into the office to have her lipids checked on 9/13.  She is aware to come fasting to this lab appt.  Pt verbalized understanding and agrees with his plan.

## 2021-12-15 ENCOUNTER — Ambulatory Visit: Payer: Medicare Other | Attending: Internal Medicine

## 2021-12-15 DIAGNOSIS — I2510 Atherosclerotic heart disease of native coronary artery without angina pectoris: Secondary | ICD-10-CM | POA: Insufficient documentation

## 2021-12-15 DIAGNOSIS — E781 Pure hyperglyceridemia: Secondary | ICD-10-CM | POA: Insufficient documentation

## 2021-12-15 DIAGNOSIS — Z79899 Other long term (current) drug therapy: Secondary | ICD-10-CM | POA: Insufficient documentation

## 2021-12-15 DIAGNOSIS — E785 Hyperlipidemia, unspecified: Secondary | ICD-10-CM | POA: Insufficient documentation

## 2021-12-15 DIAGNOSIS — E782 Mixed hyperlipidemia: Secondary | ICD-10-CM | POA: Insufficient documentation

## 2021-12-15 LAB — LIPID PANEL
Chol/HDL Ratio: 2.4 ratio (ref 0.0–4.4)
Cholesterol, Total: 154 mg/dL (ref 100–199)
HDL: 65 mg/dL (ref >39)
LDL Chol Calc (NIH): 73 mg/dL (ref 0–99)
Triglycerides: 88 mg/dL (ref 0–149)
VLDL Cholesterol Cal: 16 mg/dL (ref 5–40)

## 2021-12-15 NOTE — Progress Notes (Deleted)
Cardiology Office Note:    Date:  12/15/2021   ID:  Katrina Campbell, DOB [birth year], MRN [id number]  PCP:  Carlena Hurl, PA-C   [facility] Providers Cardiologist:  Werner Lean, MD {   Referring MD: Carlena Hurl, PA-C   History of Present Illness:    Katrina Campbell is a 66 y.o. female with a hx of CAD, thoracic aortic aneurysm, pulmonary mycobacterium avium complex, HLD, COPD and stage III NSCLC on chemo who was followed by Dr. Meda Coffee now returning to clinic for follow-up.  Per review of the record, the patient was admitted to the hospital on 11/06/2017 with complaints of sudden onset of chest pain and dyspnea approximately 10 minutes after eating a carrot.  She was given nitroglycerin sublingually and apparently had about 30 seconds of syncope afterward.  Her systolic blood pressure dropped from 158 to 60.  She had a mild troponin elevation of 0.07 and a flat trend.  She underwent left heart cath on 11/07/2017 that showed mild nonobstructive CAD with 20% stenosis of her LAD and normal LV function.  Had coronary CTA on 03/03/20 for exertional chest pain with 25-49% distal LM, 25-49% prox LA, 25-49% prox-mid Lcx stenosis. Ca score 261 (90%). She was continued on medical management.   She was diagnosed with stage III NSCLC in 10/2020 for which she was started on carboplatin and paxclitaxel. TTE 12/24/20 with LVEF 55-60%, GLS -17%, normal RV, trivial MR, mild ascending aorta dilation 67m.  Was last seen in clinic on 06/2021 where she was doing well from a CV standpoint. TTE 12/2021 with normal LVEF 55-60%, normal GLS -23.4, normal RV, mild AI.  Today,***   Past Medical History:  Diagnosis Date   Anxiety    COPD (chronic obstructive pulmonary disease) ([place]    Coronary artery disease    Dental staining 07/15/2019   Depression    History of radiation therapy    Right lung- 11/03/20-12/15/20- Dr. JGery Pray  Hyperlipidemia    Labile hypertension    Myocardial infarction  ([facility] 2018   Pneumonia    Pulmonary mycobacterial infection ([facility] 10/24/2018    Past Surgical History:  Procedure Laterality Date   BALLOON DILATION N/A 04/29/2021   Procedure: BALLOON DILATION;  Surgeon: JMilus Banister MD;  [facility];  Service: Endoscopy;  Laterality: N/A;   BIOPSY  01/14/2021   Procedure: BIOPSY;  Surgeon: IGarner Nash DO;  [facility];  Service: Pulmonary;;   BRONCHIAL BIOPSY  10/08/2020   Procedure: BRONCHIAL BIOPSIES;  Surgeon: IGarner Nash DO;  [facility];  Service: Pulmonary;;   BRONCHIAL BRUSHINGS  10/08/2020   Procedure: BRONCHIAL BRUSHINGS;  Surgeon: IGarner Nash DO;  [facility];  Service: Pulmonary;;   BRONCHIAL BRUSHINGS  01/14/2021   Procedure: BRONCHIAL BRUSHINGS;  Surgeon: IGarner Nash DO;  [facility];  Service: Pulmonary;;   BRONCHIAL WASHINGS  10/08/2020   Procedure: BRONCHIAL WASHINGS;  Surgeon: IGarner Nash DO;  [facility];  Service: Pulmonary;;   BRONCHIAL WASHINGS  01/14/2021   Procedure: BRONCHIAL WASHINGS;  Surgeon: IGarner Nash DO;  [place];  Service: Pulmonary;;   ESOPHAGOGASTRODUODENOSCOPY (EGD) WITH PROPOFOL N/A 04/29/2021   Procedure: ESOPHAGOGASTRODUODENOSCOPY (EGD) WITH PROPOFOL;  Surgeon: JMilus Banister MD;  [facility];  Service: Endoscopy;  Laterality: N/A;   FINE NEEDLE ASPIRATION  10/08/2020   Procedure: FINE NEEDLE ASPIRATION (FNA) LINEAR;  Surgeon: IGarner Nash DO;  [place]  Service: Pulmonary;;   LEFT HEART CATH AND CORONARY ANGIOGRAPHY N/A 11/07/2017   Procedure: LEFT HEART CATH AND CORONARY ANGIOGRAPHY;  Surgeon: Nelva Bush, MD;  Location: [place] [facility];  Service: Cardiovascular;  Laterality: N/A;   NASAL SINUS SURGERY  1986   VIDEO BRONCHOSCOPY Bilateral 01/29/2019   Procedure: VIDEO BRONCHOSCOPY WITH FLUORO;  Surgeon: Marshell Garfinkel, MD;  Location: [place];  Service:  Cardiopulmonary;  Laterality: Bilateral;   VIDEO BRONCHOSCOPY Right 01/14/2021   Procedure: VIDEO BRONCHOSCOPY WITHOUT FLUORO;  Surgeon: Garner Nash, DO;  Location: [place];  Service: Pulmonary;  Laterality: Right;  possible cryotherapy   VIDEO BRONCHOSCOPY WITH ENDOBRONCHIAL ULTRASOUND N/A 10/08/2020   Procedure: VIDEO BRONCHOSCOPY WITH ENDOBRONCHIAL ULTRASOUND;  Surgeon: Garner Nash, DO;  Location: [place];  Service: Pulmonary;  Laterality: N/A;    Current Medications: No outpatient medications have been marked as taking for the 12/17/21 encounter (Appointment) with Freada Bergeron, MD.     Allergies:   Breztri aerosphere [budeson-glycopyrrol-formoterol], Benadryl [diphenhydramine], Codeine, Prednisone, and Statins   Social History   Socioeconomic History   Marital status: Married    Spouse name: Not on file   Number of children: Not on file   Years of education: Not on file   Highest education level: Not on file  Occupational History   Not on file  Tobacco Use   Smoking status: Former    Packs/day: 1.75    Years: 44.00    Total pack years: 77.00    Types: Cigarettes    Quit date: 11/09/2012    Years since quitting: 9.1   Smokeless tobacco: Never   Tobacco comments:    vapor  Vaping Use   Vaping Use: Former  Substance and Sexual Activity   Alcohol use: Not Currently    Alcohol/week: 24.0 standard drinks of alcohol    Types: 24 Cans of beer per week   Drug use: Not Currently   Sexual activity: Not Currently  Other Topics Concern   Not on file  Social History Narrative   Not on file   Social Determinants of Health   Financial Resource Strain: Medium Risk (10/29/2020)   Overall Financial Resource Strain (CARDIA)    Difficulty of Paying Living Expenses: Somewhat hard  Food Insecurity: No Food Insecurity (10/29/2020)   Hunger Vital Sign    Worried About Running Out of Food in the Last Year: Never true    Ran Out of Food in the Last Year: Never  true  Transportation Needs: No Transportation Needs (10/29/2020)   PRAPARE - Hydrologist (Medical): No    Lack of Transportation (Non-Medical): No  Physical Activity: Not on file  Stress: Stress Concern Present (10/29/2020)   [place]    Feeling of Stress : Rather much  Social Connections: Unknown (10/29/2020)   Social Connection and Isolation Panel [NHANES]    Frequency of Communication with Friends and Family: More than three times a week    Frequency of Social Gatherings with Friends and Family: More than three times a week    Attends Religious Services: More than 4 times per year    Active Member of [employer] or Organizations: Not on file    Attends Archivist Meetings: Not on file    Marital Status: Married     Family History: The patient's family history includes Asthma in an other family member; Congestive Heart Failure in her father and mother; Emphysema  in her mother; Heart attack in her father and mother; Lung disease in her brother and sister. There is no history of Colon cancer.  ROS:   Review of Systems  Constitutional:  Positive for malaise/fatigue. Negative for diaphoresis and fever.  HENT:  Negative for hearing loss and nosebleeds.   Eyes:  Negative for pain and discharge.  Respiratory:  Negative for sputum production and shortness of breath.   Cardiovascular:  Negative for chest pain, palpitations, orthopnea, claudication, leg swelling and PND.  Gastrointestinal:  Negative for blood in stool and melena.  Genitourinary:  Negative for hematuria.  Musculoskeletal:  Negative for falls.  Neurological:  Negative for dizziness and loss of consciousness.  Endo/Heme/Allergies:  Negative for environmental allergies.  Psychiatric/Behavioral:  Negative for hallucinations and memory loss. The patient has insomnia.      EKGs/Labs/Other Studies Reviewed:    The following  studies were reviewed today: TTE December 20, 2021: IMPRESSIONS     1. Left ventricular ejection fraction, by estimation, is 55 to 60%. The  left ventricle has normal function. The left ventricle has no regional  wall motion abnormalities. Left ventricular diastolic parameters are  consistent with Grade I diastolic  dysfunction (impaired relaxation). Elevated left ventricular end-diastolic  pressure. The average left ventricular global longitudinal strain is -23.4  %. The global longitudinal strain is normal.   2. Right ventricular systolic function is normal. The right ventricular  size is normal.   3. The mitral valve is normal in structure. Trivial mitral valve  regurgitation. No evidence of mitral stenosis.   4. The aortic valve is tricuspid. Aortic valve regurgitation is mild. No  aortic stenosis is present.   5. The inferior vena cava is normal in size with greater than 50%  respiratory variability, suggesting right atrial pressure of 3 mmHg.   CTA Chest 03/14/2021: COMPARISON:  02/19/2021   FINDINGS: Cardiovascular: No filling defects in the pulmonary arteries to suggest pulmonary emboli. Heart is normal size. Aorta is normal caliber. Coronary artery and aortic calcifications.   Mediastinum/Nodes: No mediastinal, hilar, or axillary adenopathy. Material noted in the distal trachea thyroid unremarkable.   Lungs/Pleura: Improvement in aeration in the right upper lobe with re-expansion of the right upper lobe. Radiation changes noted medially within the right lung. Diffuse bronchial wall thickening on the right. Moderate emphysema. No effusions.   Upper Abdomen: Imaging into the upper abdomen demonstrates no acute findings.   Musculoskeletal: Chest wall soft tissues are unremarkable. No acute bony abnormality.   Review of the MIP images confirms the above findings.   IMPRESSION: No evidence of pulmonary embolus.   Near complete re-expansion of the right upper lobe.  Emphysema, bronchial wall thickening again noted, unchanged.   Mucus/debris in the distal trachea.   Coronary artery disease.   Aortic Atherosclerosis (ICD10-I70.0) and Emphysema (ICD10-J43.9).  TTE 12/24/20: IMPRESSIONS    1. Left ventricular ejection fraction, by estimation, is 55 to 60%. Left  ventricular ejection fraction by 3D volume is 62 %. The left ventricle has  normal function. The left ventricle has no regional wall motion  abnormalities. Left ventricular diastolic   parameters are indeterminate. The average left ventricular global  longitudinal strain is -17.7 %. The global longitudinal strain is normal.   2. Right ventricular systolic function is normal. The right ventricular  size is normal. Tricuspid regurgitation signal is inadequate for assessing  PA pressure.   3. Left atrial size was mildly dilated.   4. The mitral valve is normal in structure. Trivial mitral  valve  regurgitation. No evidence of mitral stenosis.   5. The aortic valve is tricuspid. Aortic valve regurgitation is trivial.  No aortic stenosis is present.   6. Aortic dilatation noted. There is mild dilatation of the ascending  aorta, measuring 40 mm.   7. The inferior vena cava is normal in size with greater than 50%  respiratory variability, suggesting right atrial pressure of 3 mmHg.   Comparison(s): No significant change from prior study.   Coronary CTA 02/2020: FINDINGS: A 100 kV prospective scan was triggered in the descending thoracic aorta at 111 HU's. Axial non-contrast 3 mm slices were carried out through the heart. The data set was analyzed on a dedicated work station and scored using the Lorimor. Gantry rotation speed was 250 msecs and collimation was .6 mm. Metoprolol 100 mg PO and 0.8 mg of sl NTG was given. The 3D data set was reconstructed in 5% intervals of the 67-82 % of the R-R cycle. Diastolic phases were analyzed on a dedicated work station using MPR, MIP and VRT  modes. The patient received 80 cc of contrast.   Aorta: Normal size. Mild diffuse atherosclerotic disease and calcifications. No dissection.   Aortic Valve:  Trileaflet.  No calcifications.   Coronary Arteries:  Normal coronary origin.  Left dominance.   RCA is a small non-dominant artery with luminal irregularities.   Left main is a large artery that gives rise to LAD and LCX arteries. Distal left main has mild to moderate eccentric calcified plaque with stenosis 25-49%.   LAD is a large vessel that gives rise to two diagonal arteries. There is mild calcified plaque in the proximal LAD with stenosis 25-49%. Mid and distal LAD has minimal plaque.   D1,2 have only minimal irregularities.   LCX is a large dominant artery that gives rise to PDA and PLA. There is mild calcified plaque in the proximal and mid LCX artery with stenosis 25-49%, otherwise only minimal non-calcified plaque.   Other findings:   Normal pulmonary vein drainage into the left atrium.   Normal left atrial appendage without a thrombus.   Normal size of the pulmonary artery.   IMPRESSION: 1. Coronary calcium score of 261. This was 91 percentile for age and sex matched control.   2. Normal coronary origin with left dominance.   3. CAD-RADS 2. Mild non-obstructive CAD (25-49%) in the distal left main and proximal portions of LAD and LCX arteries. Consider non-atherosclerotic causes of dyspnea. Consider preventive therapy and risk factor modification.  Transthoracic Echocardiogram: Date: 03/11/2020 Results: 1. Left ventricular ejection fraction, by estimation, is 55 to 60%. The  left ventricle has normal function. The left ventricle has no regional  wall motion abnormalities. Left ventricular diastolic parameters are  consistent with Grade I diastolic  dysfunction (impaired relaxation).   2. Right ventricular systolic function is normal. The right ventricular  size is normal. Tricuspid regurgitation  signal is inadequate for assessing  PA pressure.   3. The mitral valve is normal in structure. Trivial mitral valve  regurgitation. No evidence of mitral stenosis.   4. The aortic valve is tricuspid. Aortic valve regurgitation is trivial.  No aortic stenosis is present.   5. Aortic dilatation noted. There is mild dilatation of the ascending  aorta, measuring 41 mm.   6. The inferior vena cava is normal in size with greater than 50%  respiratory variability, suggesting right atrial pressure of 3 mmHg.    NonCardiac CT: Date: 09/21/20 Results: IMPRESSION: 1. Subcarinal  lymphadenopathy, increased since 06/19/2020 CT and new since 12/11/2019 CT, concerning for metastatic disease. Persistent branching tubular opacity in the anterior central right upper lobe associated with abrupt cut off of a segmental anterior right upper lobe bronchus, not substantially changed since 06/19/2020 CT. Underlying endobronchial malignancy not excluded. Multidisciplinary thoracic oncology consultation suggested. PET-CT recommended for further characterization. 2. Severe centrilobular emphysema with diffuse bronchial wall thickening, compatible with the provided history of COPD. 3. Left main and two-vessel coronary atherosclerosis. 4. Aortic Atherosclerosis (ICD10-I70.0) and Emphysema (ICD10-J43.9). 5. Mild TAA 41 mm   NM Stress Testing: Date: 06/28/2016 Results: Nuclear stress EF: 58%. Blood pressure demonstrated a normal response to exercise. There was no ST segment deviation noted during stress. Defect 1: There is a small defect of moderate severity present in the apical anterior and apex location. The study is normal. This is a low risk study. The left ventricular ejection fraction is normal (55-65%).   Normal stress nuclear study with breast attenuation but no ischemia; EF 58 with normal wall motion.   Left/Right Heart Catheterizations: Date: 11/07/2017 Results: Conclusions: Eccentric  calcification with mild stenosis of the ostial LAD (~20%).  Otherwise, no angiographically significant coronary artery disease. Normal left ventricular systolic function and filling pressure.   Recommendations: Continue medical therapy and risk factor modification to prevent progression of CAD. Monitor overnight to ensure the patient does not have recurrent chest pain.  Anticipate discharge home tomorrow morning.   Recommend Aspirin 79m daily for mild CAD and thoracic aortic aneurysm.   EKG:  EKG is personally reviewed.  06/04/2021: EKG was not ordered. 03/14/2021 (ED): Sinus rhythm. Rate 99 bpm. Anterior infarct, old.  Recent Labs: 11/18/2021: ALT 26; BUN 16; Creatinine 0.74; Hemoglobin 9.5; Platelet Count 131; Potassium 3.9; Sodium 136; TSH 18.608   Recent Lipid Panel    Component Value Date/Time   CHOL 130 06/04/2021 0920   TRIG 73 06/04/2021 0920   HDL 52 06/04/2021 0920   CHOLHDL 2.5 06/04/2021 0920   CHOLHDL 3.4 02/21/2018 0845   VLDL 38 (H) 12/30/2015 1536   LDLCALC 63 06/04/2021 0920   LDLCALC 83 02/21/2018 0845     Risk Assessment/Calculations:           Physical Exam:    VS:  There were no vitals taken for this visit.    Wt Readings from Last 3 Encounters:  11/18/21 134 lb 3 oz (60.9 kg)  10/21/21 132 lb 6.4 oz (60.1 kg)  09/27/21 130 lb 6.4 oz (59.1 kg)     GEN: Well nourished, well developed in no acute distress HEENT: Normal NECK: No JVD; No carotid bruits LYMPHATICS: No lymphadenopathy CARDIAC: RRR, no murmurs, rubs, gallops RESPIRATORY:  Clear to auscultation without rales, wheezing or rhonchi  ABDOMEN: Soft, non-tender, non-distended MUSCULOSKELETAL:  No edema; No deformity  SKIN: Warm and dry NEUROLOGIC:  Alert and oriented x 3 PSYCHIATRIC:  Normal affect   ASSESSMENT:    No diagnosis found.  PLAN:    In order of problems listed above:  #Mild Nonobstructive CAD: Patient with mild disease on cath in 2019 and mild multivessel disease on  coronary CTA in 2021. Ca score 261 (90%) currently on medical management. No anginal symptoms. TTE with normal BiV function. -Continue crestor 274m zetia 108maily  -Continue ASA 6m73mily  #NSCLC on chemo/XRT and immunotherapy: Stage IIIA non small cell lung cancer diagnosed in 10/2020. Received carboplatin and paclitaxel. Also on immunotherapy with Imfinzi. Followed by Dr. MohaJulien NordmannE with LVEF 55-60% with low normal  strain -17.7%. Will need continued surveillance. -Follow-up with onc as scheduled -TTE with strain with EF 55-60% with GLS 23.4%  #TAA: Stable on most recent TTE measuring 62m. CT chest 07/2021 stable at 4cm. -Continue serial monitor with serial imaging (can use CT scan obtained by Onc as well)  #HLD: -Continue crestor 246mdaily -Continue zetia 1022maily -LDL 73 in 12/2021  #Mild AI: -Continue serial monitoring with TTE with next 2026      Follow-up:   6 months.  Medication Adjustments/Labs and Tests Ordered: Current medicines are reviewed at length with the patient today.  Concerns regarding medicines are outlined above.   No orders of the defined types were placed in this encounter.  No orders of the defined types were placed in this encounter.  There are no Patient Instructions on file for this visit.     Signed, HeaFreada BergeronD  12/15/2021 3:54 PM    [place]

## 2021-12-16 ENCOUNTER — Inpatient Hospital Stay: Payer: Medicare Other

## 2021-12-16 ENCOUNTER — Other Ambulatory Visit: Payer: Self-pay

## 2021-12-16 ENCOUNTER — Inpatient Hospital Stay: Payer: Medicare Other | Attending: Hematology and Oncology

## 2021-12-16 ENCOUNTER — Encounter: Payer: Self-pay | Admitting: Internal Medicine

## 2021-12-16 ENCOUNTER — Inpatient Hospital Stay (HOSPITAL_BASED_OUTPATIENT_CLINIC_OR_DEPARTMENT_OTHER): Payer: Medicare Other | Admitting: Internal Medicine

## 2021-12-16 VITALS — BP 168/87 | HR 65 | Temp 97.6°F | Resp 15 | Wt 133.0 lb

## 2021-12-16 VITALS — BP 127/79 | HR 81 | Resp 18

## 2021-12-16 DIAGNOSIS — R5382 Chronic fatigue, unspecified: Secondary | ICD-10-CM

## 2021-12-16 DIAGNOSIS — I252 Old myocardial infarction: Secondary | ICD-10-CM | POA: Diagnosis not present

## 2021-12-16 DIAGNOSIS — I10 Essential (primary) hypertension: Secondary | ICD-10-CM | POA: Insufficient documentation

## 2021-12-16 DIAGNOSIS — C3401 Malignant neoplasm of right main bronchus: Secondary | ICD-10-CM | POA: Insufficient documentation

## 2021-12-16 DIAGNOSIS — D649 Anemia, unspecified: Secondary | ICD-10-CM | POA: Insufficient documentation

## 2021-12-16 DIAGNOSIS — C3491 Malignant neoplasm of unspecified part of right bronchus or lung: Secondary | ICD-10-CM

## 2021-12-16 DIAGNOSIS — J449 Chronic obstructive pulmonary disease, unspecified: Secondary | ICD-10-CM | POA: Diagnosis not present

## 2021-12-16 DIAGNOSIS — Z79899 Other long term (current) drug therapy: Secondary | ICD-10-CM | POA: Diagnosis not present

## 2021-12-16 DIAGNOSIS — C3490 Malignant neoplasm of unspecified part of unspecified bronchus or lung: Secondary | ICD-10-CM

## 2021-12-16 DIAGNOSIS — E039 Hypothyroidism, unspecified: Secondary | ICD-10-CM | POA: Insufficient documentation

## 2021-12-16 LAB — CBC WITH DIFFERENTIAL (CANCER CENTER ONLY)
Abs Immature Granulocytes: 0.02 10*3/uL (ref 0.00–0.07)
Basophils Absolute: 0 10*3/uL (ref 0.0–0.1)
Basophils Relative: 0 %
Eosinophils Absolute: 0 10*3/uL (ref 0.0–0.5)
Eosinophils Relative: 1 %
HCT: 29.4 % — ABNORMAL LOW (ref 36.0–46.0)
Hemoglobin: 9.2 g/dL — ABNORMAL LOW (ref 12.0–15.0)
Immature Granulocytes: 0 %
Lymphocytes Relative: 18 %
Lymphs Abs: 0.8 10*3/uL (ref 0.7–4.0)
MCH: 23.8 pg — ABNORMAL LOW (ref 26.0–34.0)
MCHC: 31.3 g/dL (ref 30.0–36.0)
MCV: 76.2 fL — ABNORMAL LOW (ref 80.0–100.0)
Monocytes Absolute: 0.3 10*3/uL (ref 0.1–1.0)
Monocytes Relative: 6 %
Neutro Abs: 3.4 10*3/uL (ref 1.7–7.7)
Neutrophils Relative %: 75 %
Platelet Count: 136 10*3/uL — ABNORMAL LOW (ref 150–400)
RBC: 3.86 MIL/uL — ABNORMAL LOW (ref 3.87–5.11)
RDW: 17.2 % — ABNORMAL HIGH (ref 11.5–15.5)
WBC Count: 4.6 10*3/uL (ref 4.0–10.5)
nRBC: 0 % (ref 0.0–0.2)

## 2021-12-16 LAB — CMP (CANCER CENTER ONLY)
ALT: 28 U/L (ref 0–44)
AST: 33 U/L (ref 15–41)
Albumin: 4.2 g/dL (ref 3.5–5.0)
Alkaline Phosphatase: 88 U/L (ref 38–126)
Anion gap: 6 (ref 5–15)
BUN: 11 mg/dL (ref 8–23)
CO2: 25 mmol/L (ref 22–32)
Calcium: 8.8 mg/dL — ABNORMAL LOW (ref 8.9–10.3)
Chloride: 103 mmol/L (ref 98–111)
Creatinine: 0.69 mg/dL (ref 0.44–1.00)
GFR, Estimated: >60 mL/min (ref >60)
Glucose, Bld: 90 mg/dL (ref 70–99)
Potassium: 3.8 mmol/L (ref 3.5–5.1)
Sodium: 134 mmol/L — ABNORMAL LOW (ref 135–145)
Total Bilirubin: 0.4 mg/dL (ref 0.3–1.2)
Total Protein: 6.6 g/dL (ref 6.5–8.1)

## 2021-12-16 LAB — TSH: TSH: 12.413 u[IU]/mL — ABNORMAL HIGH (ref 0.350–4.500)

## 2021-12-16 MED ORDER — SODIUM CHLORIDE 0.9 % IV SOLN: Freq: Once | INTRAVENOUS | Status: AC

## 2021-12-16 MED ORDER — SODIUM CHLORIDE 0.9 % IV SOLN
1500.0000 mg | Freq: Once | INTRAVENOUS | Status: AC
  Administered 2021-12-16: 1500 mg via INTRAVENOUS
  Filled 2021-12-16: qty 30

## 2021-12-16 NOTE — Patient Instructions (Signed)
[facility]  Discharge Instructions: Thank you for choosing [facility] to provide your oncology and hematology care.   If you have a lab appointment with the Cancer Center, please go directly to the Cancer Center and check in at the registration area.   Wear comfortable clothing and clothing appropriate for easy access to any Portacath or PICC line.   We strive to give you quality time with your provider. You may need to reschedule your appointment if you arrive late (15 or more minutes).  Arriving late affects you and other patients whose appointments are after yours.  Also, if you miss three or more appointments without notifying the office, you may be dismissed from the clinic at the provider's discretion.      For prescription refill requests, have your pharmacy contact our office and allow 72 hours for refills to be completed.    Today you received the following chemotherapy and/or immunotherapy agents; Imfinzi      To help prevent nausea and vomiting after your treatment, we encourage you to take your nausea medication as directed.  BELOW ARE SYMPTOMS THAT SHOULD BE REPORTED IMMEDIATELY: *FEVER GREATER THAN 100.4 F (38 C) OR HIGHER *CHILLS OR SWEATING *NAUSEA AND VOMITING THAT IS NOT CONTROLLED WITH YOUR NAUSEA MEDICATION *UNUSUAL SHORTNESS OF BREATH *UNUSUAL BRUISING OR BLEEDING *URINARY PROBLEMS (pain or burning when urinating, or frequent urination) *BOWEL PROBLEMS (unusual diarrhea, constipation, pain near the anus) TENDERNESS IN MOUTH AND THROAT WITH OR WITHOUT PRESENCE OF ULCERS (sore throat, sores in mouth, or a toothache) UNUSUAL RASH, SWELLING OR PAIN  UNUSUAL VAGINAL DISCHARGE OR ITCHING   Items with * indicate a potential emergency and should be followed up as soon as possible or go to the Emergency Department if any problems should occur.  Please show the CHEMOTHERAPY ALERT CARD or IMMUNOTHERAPY ALERT CARD at check-in to  the Emergency Department and triage nurse.  Should you have questions after your visit or need to cancel or reschedule your appointment, please contact [facility]  Dept: [phone]  and follow the prompts.  Office hours are 8:00 a.m. to 4:30 p.m. Monday - Friday. Please note that voicemails left after 4:00 p.m. may not be returned until the following business day.  We are closed weekends and major holidays. You have access to a nurse at all times for urgent questions. Please call the main number to the clinic Dept: [phone] and follow the prompts.   For any non-urgent questions, you may also contact your provider using MyChart. We now offer e-Visits for anyone 18 and older to request care online for non-urgent symptoms. For details visit [email or web address].   Also download the MyChart app! Go to the app store, search "MyChart", open the app, select [facility], and log in with your MyChart username and password.  Masks are optional in the cancer centers. If you would like for your care team to wear a mask while they are taking care of you, please let them know. You may have one support person who is at least 68 years old accompany you for your appointments. 

## 2021-12-16 NOTE — Progress Notes (Signed)
Cardiology Office Note:    Date:  12/17/2021   ID:  Katrina Campbell, DOB 08-01-1955, MRN [id number]  PCP:  Carlena Hurl, PA-C   [facility] Providers Cardiologist:  Werner Lean, MD {   Referring MD: Carlena Hurl, PA-C   History of Present Illness:    Katrina Campbell is a 66 y.o. female with a hx of CAD, thoracic aortic aneurysm, pulmonary mycobacterium avium complex, HLD, COPD and stage III NSCLC on chemo who was followed by Dr. Meda Coffee now returning to clinic for follow-up.  Per review of the record, the patient was admitted to the hospital on 11/06/2017 with complaints of sudden onset of chest pain and dyspnea approximately 10 minutes after eating a carrot.  She was given nitroglycerin sublingually and apparently had about 30 seconds of syncope afterward.  Her systolic blood pressure dropped from 158 to 60.  She had a mild troponin elevation of 0.07 and a flat trend.  She underwent left heart cath on 11/07/2017 that showed mild nonobstructive CAD with 20% stenosis of her LAD and normal LV function.  Had coronary CTA on 03/03/20 for exertional chest pain with 25-49% distal LM, 25-49% prox LA, 25-49% prox-mid Lcx stenosis. Ca score 261 (90%). She was continued on medical management.   She was diagnosed with stage III NSCLC in 10/2020 for which she was started on carboplatin and paxclitaxel. TTE 12/24/20 with LVEF 55-60%, GLS -17%, normal RV, trivial MR, mild ascending aorta dilation 59m.  Was last seen in clinic on 06/2021 where she was doing well from a CV standpoint. TTE 12/2021 with normal LVEF 55-60%, normal GLS -23.4, normal RV, mild AI.  Today, the patient states that she received her last cancer treatment yesterday. She is also struggling with thyroid issues; she states that "it won't level out". It is felt this should resolve when she recovers from her cancer treatments.  Otherwise, she is doing well from a CV standpoint. Every now and then she has "jitters" or flutters,  usually while she is lying down at night. These palpitations are not overly bothersome and resolve within a couple of minutes.  She complains of some shortness of breath which she mostly attributes to her cancer. She is also very short of breath after bending over. She is still able to exercise without issue and walks on the treadmill for 30 minutes on an incline. Afterwards she feels well with no shortness of breath.   Additionally she was diagnosed with anemia yesterday. She was started on an iron supplement.  She denies any chest pain, or peripheral edema. No lightheadedness, syncope, orthopnea, or PND.   Past Medical History:  Diagnosis Date   Anxiety    [place]D [place]chronic obstructive pulmonary disease) [place]    Coronary artery disease    Dental staining 07/15/2019   Depression    History of radiation therapy    Right lung- 11/03/20-12/15/20- Dr. JGery Pray  Hyperlipidemia    Labile hypertension    Myocardial infarction ([facility] 2018   Pneumonia    [place] mycobacterial infection [place] 10/24/2018    Past Surgical History:  Procedure Laterality Date   BALLOON DILATION N/A 04/29/2021   Procedure: BALLOON DILATION;  Surgeon: JMilus Banister MD;  Location: [facility];  Service: Endoscopy;  Laterality: N/A;   BIOPSY  01/14/2021   Procedure: BIOPSY;  Surgeon: IGarner Nash DO;  [facility];  Service: Pulmonary;;   BRONCHIAL BIOPSY  10/08/2020   Procedure: BRONCHIAL BIOPSIES;  Surgeon: IJune Leap  L, DO;  Location: [place] [facility];  Service: Pulmonary;;   BRONCHIAL BRUSHINGS  10/08/2020   Procedure: BRONCHIAL BRUSHINGS;  Surgeon: Garner Nash, DO;  Location: [place] [facility];  Service: Pulmonary;;   BRONCHIAL BRUSHINGS  01/14/2021   Procedure: BRONCHIAL BRUSHINGS;  Surgeon: Garner Nash, DO;  Location: [place] [facility];  Service: Pulmonary;;   BRONCHIAL WASHINGS  10/08/2020   Procedure: BRONCHIAL WASHINGS;  Surgeon: Garner Nash, DO;  Location: [place] [facility];  Service:  Pulmonary;;   BRONCHIAL WASHINGS  01/14/2021   Procedure: BRONCHIAL WASHINGS;  Surgeon: Garner Nash, DO;  Location: [place] ENDOSCOPY;  Service: Pulmonary;;   ESOPHAGOGASTRODUODENOSCOPY (EGD) WITH PROPOFOL N/A 04/29/2021   Procedure: ESOPHAGOGASTRODUODENOSCOPY (EGD) WITH PROPOFOL;  Surgeon: Milus Banister, MD;  Location: [facility];  Service: Endoscopy;  Laterality: N/A;   FINE NEEDLE ASPIRATION  10/08/2020   Procedure: FINE NEEDLE ASPIRATION (FNA) LINEAR;  Surgeon: Garner Nash, DO;  Location: [place] [facility]COPY;  Service: Pulmonary;;   LEFT HEART CATH AND CORONARY ANGIOGRAPHY N/A 11/07/2017   Procedure: LEFT HEART CATH AND CORONARY ANGIOGRAPHY;  Surgeon: Nelva Bush, MD;  Location: [place] [facility];  Service: Cardiovascular;  Laterality: N/A;   NASAL SINUS SURGERY  1986   VIDEO BRONCHOSCOPY Bilateral 01/29/2019   Procedure: VIDEO BRONCHOSCOPY WITH FLUORO;  Surgeon: Marshell Garfinkel, MD;  Location: [place];  Service: Cardiopulmonary;  Laterality: Bilateral;   VIDEO BRONCHOSCOPY Right 01/14/2021   Procedure: VIDEO BRONCHOSCOPY WITHOUT FLUORO;  Surgeon: Garner Nash, DO;  Location: [place];  Service: Pulmonary;  Laterality: Right;  possible cryotherapy   VIDEO BRONCHOSCOPY WITH ENDOBRONCHIAL ULTRASOUND N/A 10/08/2020   Procedure: VIDEO BRONCHOSCOPY WITH ENDOBRONCHIAL ULTRASOUND;  Surgeon: Garner Nash, DO;  Location: [place];  Service: Pulmonary;  Laterality: N/A;    Current Medications: Current Meds  Medication Sig   acetaminophen (TYLENOL) 500 MG tablet Take 500 mg by mouth every 6 (six) hours as needed for moderate pain or headache.   albuterol (PROVENTIL) (2.5 MG/3ML) 0.083% nebulizer solution Take 3 mLs (2.5 mg total) by nebulization every 6 (six) hours as needed for wheezing or shortness of breath.   albuterol (VENTOLIN HFA) 108 (90 Base) MCG/ACT inhaler Inhale 2 puffs into the lungs every 6 (six) hours as needed for wheezing or shortness of breath.   aspirin EC  81 MG tablet Take 81 mg by mouth daily with lunch.    buPROPion (WELLBUTRIN XL) 300 MG 24 hr tablet TAKE 1 TABLET DAILY   Cholecalciferol (DIALYVITE VITAMIN D 5000) 125 MCG (5000 UT) capsule Take 5,000 Units by mouth daily.   DURVALUMAB IV Inject 1,500 mg into the vein every 28 (twenty-eight) days.   esomeprazole (NEXIUM) 40 MG capsule Take 1 capsule (40 mg total) by mouth daily.   Fluticasone-Umeclidin-Vilant (TRELEGY ELLIPTA) 100-62.5-25 MCG/ACT AEPB Inhale 1 puff into the lungs daily.   Guaifenesin (MUCINEX MAXIMUM STRENGTH) 1200 MG TB12 Take 1,200 mg by mouth 2 (two) times daily.   levothyroxine (SYNTHROID) 112 MCG tablet Take 1 tablet (112 mcg total) by mouth daily.   levothyroxine (SYNTHROID) 125 MCG tablet Take 1 tablet (125 mcg total) by mouth daily.   Magnesium 500 MG TABS Take 500 mg by mouth daily with lunch.   Multiple Vitamins-Minerals (IMMUNE SUPPORT PO) Take 2 tablets by mouth daily.   Respiratory Therapy Supplies (FLUTTER) DEVI Use as directed   rosuvastatin (CRESTOR) 20 MG tablet Take 1 tablet (20 mg total) by mouth daily.   sodium chloride (OCEAN) 0.65 % SOLN nasal spray Place 1  spray into both nostrils daily as needed for congestion.   temazepam (RESTORIL) 15 MG capsule Take 1 capsule (15 mg total) by mouth at bedtime as needed.   vitamin B-12 (CYANOCOBALAMIN) 1000 MCG tablet Take 1,000 mcg by mouth daily.     Allergies:   Breztri aerosphere [budeson-glycopyrrol-formoterol], Benadryl [diphenhydramine], Codeine, Prednisone, and Statins   Social History   Socioeconomic History   Marital status: Married    Spouse name: Not on file   Number of children: Not on file   Years of education: Not on file   Highest education level: Not on file  Occupational History   Not on file  Tobacco Use   Smoking status: Former    Packs/day: 1.75    Years: 44.00    Total pack years: 77.00    Types: Cigarettes    Quit date: 11/09/2012    Years since quitting: 9.1   Smokeless tobacco:  Never   Tobacco comments:    vapor  Vaping Use   Vaping Use: Former  Substance and Sexual Activity   Alcohol use: Not Currently    Alcohol/week: 24.0 standard drinks of alcohol    Types: 24 Cans of beer per week   Drug use: Not Currently   Sexual activity: Not Currently  Other Topics Concern   Not on file  Social History Narrative   Not on file   Social Determinants of Health   Financial Resource Strain: Medium Risk (10/29/2020)   Overall Financial Resource Strain (CARDIA)    Difficulty of Paying Living Expenses: Somewhat hard  Food Insecurity: No Food Insecurity (10/29/2020)   Hunger Vital Sign    Worried About Running Out of Food in the Last Year: Never true    Ran Out of Food in the Last Year: Never true  Transportation Needs: No Transportation Needs (10/29/2020)   PRAPARE - Hydrologist (Medical): No    Lack of Transportation (Non-Medical): No  Physical Activity: Not on file  Stress: Stress Concern Present (10/29/2020)   [place]    Feeling of Stress : Rather much  Social Connections: Unknown (10/29/2020)   Social Connection and Isolation Panel [NHANES]    Frequency of Communication with Friends and Family: More than three times a week    Frequency of Social Gatherings with Friends and Family: More than three times a week    Attends Religious Services: More than 4 times per year    Active Member of [employer] or Organizations: Not on file    Attends Music therapist: Not on file    Marital Status: Married     Family History: The patient's family history includes Asthma in an other family member; Congestive Heart Failure in her father and mother; Emphysema in her mother; Heart attack in her father and mother; Lung disease in her brother and sister. There is no history of Colon cancer.  ROS:   Review of Systems  Constitutional:  Negative for diaphoresis and fever.   HENT:  Negative for hearing loss and nosebleeds.   Eyes:  Negative for pain and discharge.  Respiratory:  Positive for shortness of breath. Negative for sputum production.   Cardiovascular:  Positive for palpitations. Negative for chest pain, orthopnea, claudication, leg swelling and PND.  Gastrointestinal:  Negative for blood in stool and melena.  Genitourinary:  Negative for hematuria.  Musculoskeletal:  Negative for falls.  Neurological:  Positive for headaches. Negative for dizziness and loss  of consciousness.  Endo/Heme/Allergies:  Negative for environmental allergies.  Psychiatric/Behavioral:  Negative for hallucinations and memory loss.      EKGs/Labs/Other Studies Reviewed:    The following studies were reviewed today:  TTE 12/2021: IMPRESSIONS    1. Left ventricular ejection fraction, by estimation, is 55 to 60%. The  left ventricle has normal function. The left ventricle has no regional  wall motion abnormalities. Left ventricular diastolic parameters are  consistent with Grade I diastolic  dysfunction (impaired relaxation). Elevated left ventricular end-diastolic  pressure. The average left ventricular global longitudinal strain is -23.4  %. The global longitudinal strain is normal.   2. Right ventricular systolic function is normal. The right ventricular  size is normal.   3. The mitral valve is normal in structure. Trivial mitral valve  regurgitation. No evidence of mitral stenosis.   4. The aortic valve is tricuspid. Aortic valve regurgitation is mild. No  aortic stenosis is present.   5. The inferior vena cava is normal in size with greater than 50%  respiratory variability, suggesting right atrial pressure of 3 mmHg.   CTA Chest 03/14/2021: COMPARISON:  02/19/2021   FINDINGS: Cardiovascular: No filling defects in the pulmonary arteries to suggest pulmonary emboli. Heart is normal size. Aorta is normal caliber. Coronary artery and aortic calcifications.    Mediastinum/Nodes: No mediastinal, hilar, or axillary adenopathy. Material noted in the distal trachea thyroid unremarkable.   Lungs/Pleura: Improvement in aeration in the right upper lobe with re-expansion of the right upper lobe. Radiation changes noted medially within the right lung. Diffuse bronchial wall thickening on the right. Moderate emphysema. No effusions.   Upper Abdomen: Imaging into the upper abdomen demonstrates no acute findings.   Musculoskeletal: Chest wall soft tissues are unremarkable. No acute bony abnormality.   Review of the MIP images confirms the above findings.   IMPRESSION: No evidence of pulmonary embolus.   Near complete re-expansion of the right upper lobe. Emphysema, bronchial wall thickening again noted, unchanged.   Mucus/debris in the distal trachea.   Coronary artery disease.   Aortic Atherosclerosis (ICD10-I70.0) and Emphysema (ICD10-J43.9).  TTE 12/24/20: IMPRESSIONS    1. Left ventricular ejection fraction, by estimation, is 55 to 60%. Left  ventricular ejection fraction by 3D volume is 62 %. The left ventricle has  normal function. The left ventricle has no regional wall motion  abnormalities. Left ventricular diastolic   parameters are indeterminate. The average left ventricular global  longitudinal strain is -17.7 %. The global longitudinal strain is normal.   2. Right ventricular systolic function is normal. The right ventricular  size is normal. Tricuspid regurgitation signal is inadequate for assessing  PA pressure.   3. Left atrial size was mildly dilated.   4. The mitral valve is normal in structure. Trivial mitral valve  regurgitation. No evidence of mitral stenosis.   5. The aortic valve is tricuspid. Aortic valve regurgitation is trivial.  No aortic stenosis is present.   6. Aortic dilatation noted. There is mild dilatation of the ascending  aorta, measuring 40 mm.   7. The inferior vena cava is normal in size with  greater than 50%  respiratory variability, suggesting right atrial pressure of 3 mmHg.   Comparison(s): No significant change from prior study.   Coronary CTA 02/2020: FINDINGS: A 100 kV prospective scan was triggered in the descending thoracic aorta at 111 HU's. Axial non-contrast 3 mm slices were carried out through the heart. The data set was analyzed on a dedicated work  station and scored using the Agatson method. Gantry rotation speed was 250 msecs and collimation was .6 mm. Metoprolol 100 mg PO and 0.8 mg of sl NTG was given. The 3D data set was reconstructed in 5% intervals of the 67-82 % of the R-R cycle. Diastolic phases were analyzed on a dedicated work station using MPR, MIP and VRT modes. The patient received 80 cc of contrast.   Aorta: Normal size. Mild diffuse atherosclerotic disease and calcifications. No dissection.   Aortic Valve:  Trileaflet.  No calcifications.   Coronary Arteries:  Normal coronary origin.  Left dominance.   RCA is a small non-dominant artery with luminal irregularities.   Left main is a large artery that gives rise to LAD and LCX arteries. Distal left main has mild to moderate eccentric calcified plaque with stenosis 25-49%.   LAD is a large vessel that gives rise to two diagonal arteries. There is mild calcified plaque in the proximal LAD with stenosis 25-49%. Mid and distal LAD has minimal plaque.   D1,2 have only minimal irregularities.   LCX is a large dominant artery that gives rise to PDA and PLA. There is mild calcified plaque in the proximal and mid LCX artery with stenosis 25-49%, otherwise only minimal non-calcified plaque.   Other findings:   Normal pulmonary vein drainage into the left atrium.   Normal left atrial appendage without a thrombus.   Normal size of the pulmonary artery.   IMPRESSION: 1. Coronary calcium score of 261. This was 60 percentile for age and sex matched control.   2. Normal coronary origin with  left dominance.   3. CAD-RADS 2. Mild non-obstructive CAD (25-49%) in the distal left main and proximal portions of LAD and LCX arteries. Consider non-atherosclerotic causes of dyspnea. Consider preventive therapy and risk factor modification.  Transthoracic Echocardiogram: Date: 03/11/2020 Results: 1. Left ventricular ejection fraction, by estimation, is 55 to 60%. The  left ventricle has normal function. The left ventricle has no regional  wall motion abnormalities. Left ventricular diastolic parameters are  consistent with Grade I diastolic  dysfunction (impaired relaxation).   2. Right ventricular systolic function is normal. The right ventricular  size is normal. Tricuspid regurgitation signal is inadequate for assessing  PA pressure.   3. The mitral valve is normal in structure. Trivial mitral valve  regurgitation. No evidence of mitral stenosis.   4. The aortic valve is tricuspid. Aortic valve regurgitation is trivial.  No aortic stenosis is present.   5. Aortic dilatation noted. There is mild dilatation of the ascending  aorta, measuring 41 mm.   6. The inferior vena cava is normal in size with greater than 50%  respiratory variability, suggesting right atrial pressure of 3 mmHg.    NonCardiac CT: Date: 09/21/20 Results: IMPRESSION: 1. Subcarinal lymphadenopathy, increased since 06/19/2020 CT and new since 12/11/2019 CT, concerning for metastatic disease. Persistent branching tubular opacity in the anterior central right upper lobe associated with abrupt cut off of a segmental anterior right upper lobe bronchus, not substantially changed since 06/19/2020 CT. Underlying endobronchial malignancy not excluded. Multidisciplinary thoracic oncology consultation suggested. PET-CT recommended for further characterization. 2. Severe centrilobular emphysema with diffuse bronchial wall thickening, compatible with the provided history of COPD. 3. Left main and two-vessel coronary  atherosclerosis. 4. Aortic Atherosclerosis (ICD10-I70.0) and Emphysema (ICD10-J43.9). 5. Mild TAA 41 mm   NM Stress Testing: Date: 06/28/2016 Results: Nuclear stress EF: 58%. Blood pressure demonstrated a normal response to exercise. There was no ST segment deviation  noted during stress. Defect 1: There is a small defect of moderate severity present in the apical anterior and apex location. The study is normal. This is a low risk study. The left ventricular ejection fraction is normal (55-65%).   Normal stress nuclear study with breast attenuation but no ischemia; EF 58 with normal wall motion.   Left/Right Heart Catheterizations: Date: 11/07/2017 Results: Conclusions: Eccentric calcification with mild stenosis of the ostial LAD (~20%).  Otherwise, no angiographically significant coronary artery disease. Normal left ventricular systolic function and filling pressure.   Recommendations: Continue medical therapy and risk factor modification to prevent progression of CAD. Monitor overnight to ensure the patient does not have recurrent chest pain.  Anticipate discharge home tomorrow morning.   Recommend Aspirin 19m daily for mild CAD and thoracic aortic aneurysm.   EKG:  EKG is personally reviewed.  12/17/2021:  Sinus rhythm. Rate 79 bpm. 06/04/2021: EKG was not ordered. 03/14/2021 (ED): Sinus rhythm. Rate 99 bpm. Anterior infarct, old.  Recent Labs: 12/16/2021: ALT 28; BUN 11; Creatinine 0.69; Hemoglobin 9.2; Platelet Count 136; Potassium 3.8; Sodium 134; TSH 12.413   Recent Lipid Panel    Component Value Date/Time   CHOL 154 12/15/2021 0744   TRIG 88 12/15/2021 0744   HDL 65 12/15/2021 0744   CHOLHDL 2.4 12/15/2021 0744   CHOLHDL 3.4 02/21/2018 0845   VLDL 38 (H) 12/30/2015 1536   LDLCALC 73 12/15/2021 0744   LDLCALC 83 02/21/2018 0845     Risk Assessment/Calculations:           Physical Exam:    VS:  BP 126/80   Pulse 79   Ht _0  (1.626 m)   Wt 132 lb (59.9  kg)   SpO2 97%   BMI 22.66 kg/m     Wt Readings from Last 3 Encounters:  12/17/21 132 lb (59.9 kg)  12/16/21 133 lb (60.3 kg)  11/18/21 134 lb 3 oz (60.9 kg)     GEN: Well nourished, well developed in no acute distress HEENT: Normal NECK: No JVD; No carotid bruits CARDIAC: RRR, 1/6 soft systolic murmur, No rubs, No gallops RESPIRATORY:  Clear to auscultation without rales, wheezing or rhonchi  ABDOMEN: Soft, non-tender, non-distended MUSCULOSKELETAL:  No edema; No deformity  SKIN: Warm and dry NEUROLOGIC:  Alert and oriented x 3 PSYCHIATRIC:  Normal affect   ASSESSMENT:    1. Coronary artery disease involving native coronary artery of native heart without angina pectoris   2. Hyperlipidemia, unspecified hyperlipidemia type   3. Aneurysm of ascending aorta without rupture ([place]   4. History of chemotherapy   5. Mixed hyperlipidemia   6. Malignant small cell cancer (HCC)     PLAN:    In order of problems listed above:  #Mild Nonobstructive CAD: Patient with mild disease on cath in 2019 and mild multivessel disease on coronary CTA in 2021. Ca score 261 (90%) currently on medical management. No anginal symptoms. TTE with normal BiV function. -Continue crestor 282m zetia 1048maily  -Continue ASA 24m22mily  #NSCLC on chemo/XRT and immunotherapy: Stage IIIA non small cell lung cancer diagnosed in 10/2020. Received carboplatin and paclitaxel. Also on immunotherapy with Imfinzi. Followed by Dr. MohaJulien NordmannE 12/2021 with EF 55-60%, GLS 23.4% -Follow-up with onc as scheduled -TTE with strain with EF 55-60% with GLS 23.4%  #TAA: Stable on most recent TTE measuring 40mm26m chest 07/2021 stable at 4cm. -Continue serial monitor with serial imaging (can use CT scan obtained by Onc as well)  #  HLD: -Continue crestor 79m daily -Continue zetia 134mdaily -LDL 73 in 12/2021  #Mild AI: -Continue serial monitoring with TTE with next 2026      Follow-up:   6  months.  Medication Adjustments/Labs and Tests Ordered: Current medicines are reviewed at length with the patient today.  Concerns regarding medicines are outlined above.   No orders of the defined types were placed in this encounter.  No orders of the defined types were placed in this encounter.  Patient Instructions  Medication Instructions:   Your physician recommends that you continue on your current medications as directed. Please refer to the Current Medication list given to you today.  *If you need a refill on your cardiac medications before your next appointment, please call your pharmacy*   Follow-Up: [facility] are [facility].  As part of our continuing mission to provide you with exceptional heart care, we have created designated Provider Care Teams.  These Care Teams include your primary Cardiologist (physician) and Advanced Practice Providers (APPs -  Physician Assistants and Nurse Practitioners) who all work together to provide you with the care you need, when you need it.  We recommend signing up for the patient portal called "MyChart".  Sign up information is provided on this After Visit Summary.  MyChart is used to connect with patients for Virtual Visits (Telemedicine).  Patients are able to view lab/test results, encounter notes, upcoming appointments, etc.  Non-urgent messages can be sent to your provider as well.   To learn more about what you can do with MyChart, go to [email or web address]   Your next appointment:   6 month(s)  The format for your next appointment:   In Person  Provider:   DR. PEJohney FrameImportant Information About Sugar        I,Mathew Stumpf,acting as a scribe for HeFreada BergeronMD.,have documented all relevant documentation on the behalf of HeFreada BergeronMD,as directed by  HeFreada BergeronMD while in the presence of HeFreada BergeronMD.   I, HeFreada Bergeron MD, have reviewed all documentation for this visit. The documentation on 12/17/21 for the exam, diagnosis, procedures, and orders are all accurate and complete.   Signed, HeFreada BergeronMD  12/17/2021 12:52 PM    [place]

## 2021-12-16 NOTE — Progress Notes (Signed)
[place] [phone]   [phone]  OFFICE PROGRESS NOTE  Carlena Hurl, PA-C [address] [place] [address]  DIAGNOSIS: Stage IIIA (TX, N2, M0) non-small cell lung cancer, squamous cell carcinoma presented with subcarinal mass/lymphadenopathy diagnosed in July 2022.   PRIOR THERAPY: Weekly carboplatin for AUC of 2 and paclitaxel 45 mg/m2.  First dose expected on 11/02/2020.  Status post 7 cycles.  Last dose was given 12/14/2020   CURRENT THERAPY: Consolidation treatment with immunotherapy with Imfinzi 1500 Mg IV every 4 weeks.  First dose 01/14/2021.  Status post 12 cycles  INTERVAL HISTORY: Katrina Campbell 66 y.o. female returns to the clinic today for follow-up visit.  The patient is feeling fine today with no concerning complaints except for mild fatigue and shortness of breath with exertion.  She denied having any chest pain, cough or hemoptysis.  She has no nausea, vomiting, diarrhea or constipation.  She has no headache or visual changes.  She denied having any recent weight loss or night sweats.  She is here today for evaluation before starting the last cycle of her consolidation treatment with immunotherapy.  MEDICAL HISTORY: Past Medical History:  Diagnosis Date   Anxiety    COPD (chronic obstructive pulmonary disease) ([place])    Coronary artery disease    Dental staining 07/15/2019   Depression    History of radiation therapy    Right lung- 11/03/20-12/15/20- Dr. 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  MEDICATIONS:  Current Outpatient Medications  Medication Sig Dispense Refill   acetaminophen (TYLENOL) 500 MG tablet Take 500 mg by mouth every 6 (six) hours as  needed for moderate pain or headache.     albuterol (PROVENTIL) (2.5 MG/3ML) 0.083% nebulizer solution Take 3 mLs (2.5 mg total) by nebulization every 6 (six) hours as needed for wheezing or shortness of breath. 540 mL 3   albuterol (VENTOLIN HFA) 108 (90 Base) MCG/ACT inhaler Inhale 2 puffs into the lungs every 6 (six) hours as needed for wheezing or shortness of breath.     aspirin EC 81 MG tablet Take 81 mg by mouth daily with lunch.      buPROPion (WELLBUTRIN XL) 300 MG 24 hr tablet TAKE 1 TABLET DAILY 90 tablet 1   Cholecalciferol (DIALYVITE VITAMIN D 5000) 125 MCG (5000 UT) capsule Take 5,000 Units by mouth daily.     DURVALUMAB IV Inject 1,500 mg into the vein every 28 (twenty-eight) days.     esomeprazole (NEXIUM) 40 MG capsule Take 1 capsule (40 mg total) by mouth daily. 90 capsule 3   Fluticasone-Umeclidin-Vilant (TRELEGY ELLIPTA) 100-62.5-25 MCG/ACT AEPB Inhale 1 puff into the lungs daily. 180 each 3   Guaifenesin (MUCINEX MAXIMUM STRENGTH) 1200 MG TB12 Take 1,200 mg by mouth 2 (two) times daily.     levothyroxine (SYNTHROID) 100 MCG tablet Take 1 tablet (100 mcg total) by mouth daily. 30 tablet 1   Magnesium 500 MG TABS Take 500 mg by mouth daily with lunch.     Multiple Vitamins-Minerals (IMMUNE SUPPORT PO) Take 2 tablets by mouth daily.     Respiratory Therapy Supplies (FLUTTER) DEVI Use as directed 1 each 0   rosuvastatin (CRESTOR) 20 MG tablet Take 1 tablet (20 mg total) by mouth  daily. 90 tablet 3   sodium chloride (OCEAN) 0.65 % SOLN nasal spray Place 1 spray into both nostrils daily as needed for congestion.     temazepam (RESTORIL) 15 MG capsule Take 1 capsule (15 mg total) by mouth at bedtime as needed. 90 capsule 1   vitamin B-12 (CYANOCOBALAMIN) 1000 MCG tablet Take 1,000 mcg by mouth daily.     No current facility-administered medications for this visit.   Facility-Administered Medications Ordered in Other Visits  Medication Dose Route Frequency Provider Last Rate Last  Admin   Sonafine emulsion 1 application  1 application  Topical Once Gery Pray, MD        SURGICAL HISTORY:  Past Surgical History:  Procedure Laterality Date   BALLOON DILATION N/A 04/29/2021   Procedure: BALLOON DILATION;  Surgeon: Milus Banister, MD;  Location: [facility];  Service: Endoscopy;  Laterality: N/A;   BIOPSY  01/14/2021   Procedure: BIOPSY;  Surgeon: Garner Nash, DO;  Location: [facility];  Service: Pulmonary;;   BRONCHIAL BIOPSY  10/08/2020   Procedure: BRONCHIAL BIOPSIES;  Surgeon: Garner Nash, DO;  Location: [facility];  Service: Pulmonary;;   BRONCHIAL BRUSHINGS  10/08/2020   Procedure: BRONCHIAL BRUSHINGS;  Surgeon: Garner Nash, DO;  Location: [facility];  Service: Pulmonary;;   BRONCHIAL BRUSHINGS  01/14/2021   Procedure: BRONCHIAL BRUSHINGS;  Surgeon: Garner Nash, DO;  Location: [facility];  Service: Pulmonary;;   BRONCHIAL WASHINGS  10/08/2020   Procedure: BRONCHIAL WASHINGS;  Surgeon: Garner Nash, DO;  Location: [facility];  Service: Pulmonary;;   BRONCHIAL WASHINGS  01/14/2021   Procedure: BRONCHIAL WASHINGS;  Surgeon: Garner Nash, DO;  Location: [facility];  Service: Pulmonary;;   ESOPHAGOGASTRODUODENOSCOPY (EGD) WITH PROPOFOL N/A 04/29/2021   Procedure: ESOPHAGOGASTRODUODENOSCOPY (EGD) WITH PROPOFOL;  Surgeon: Milus Banister, MD;  Location: [facility];  Service: Endoscopy;  Laterality: N/A;   FINE NEEDLE ASPIRATION  10/08/2020   Procedure: FINE NEEDLE ASPIRATION (FNA) LINEAR;  Surgeon: Garner Nash, DO;  Location: [facility];  Service: Pulmonary;;   LEFT HEART CATH AND CORONARY ANGIOGRAPHY N/A 11/07/2017   Procedure: LEFT HEART CATH AND CORONARY ANGIOGRAPHY;  Surgeon: Nelva Bush, MD;  Location: [facility];  Service: Cardiovascular;  Laterality: N/A;   NASAL SINUS SURGERY  1986   VIDEO BRONCHOSCOPY Bilateral 01/29/2019   Procedure: VIDEO BRONCHOSCOPY WITH FLUORO;  Surgeon: Marshell Garfinkel, MD;   Location: [facility];  Service: Cardiopulmonary;  Laterality: Bilateral;   VIDEO BRONCHOSCOPY Right 01/14/2021   Procedure: VIDEO BRONCHOSCOPY WITHOUT FLUORO;  Surgeon: Garner Nash, DO;  Location: [facility];  Service: Pulmonary;  Laterality: Right;  possible cryotherapy   VIDEO BRONCHOSCOPY WITH ENDOBRONCHIAL ULTRASOUND N/A 10/08/2020   Procedure: VIDEO BRONCHOSCOPY WITH ENDOBRONCHIAL ULTRASOUND;  Surgeon: Garner Nash, DO;  Location: [facility];  Service: Pulmonary;  Laterality: N/A;    REVIEW OF SYSTEMS:  A comprehensive review of systems was negative except for: Respiratory: positive for dyspnea on exertion   PHYSICAL EXAMINATION: General appearance: alert, cooperative, and no distress Head: Normocephalic, without obvious abnormality, atraumatic Neck: no adenopathy, no JVD, supple, symmetrical, trachea midline, and thyroid not enlarged, symmetric, no tenderness/mass/nodules Lymph nodes: Cervical, supraclavicular, and axillary nodes normal. Resp: clear to auscultation bilaterally Back: symmetric, no curvature. ROM normal. No CVA tenderness. Cardio: regular rate and rhythm, S1, S2 normal, no murmur, click, rub or gallop GI: soft, non-tender; bowel sounds normal; no masses,  no organomegaly Extremities: extremities normal, atraumatic, no cyanosis or edema  ECOG PERFORMANCE STATUS: 1 - Symptomatic but completely ambulatory  Blood pressure (!) 168/87, pulse 65, temperature 97.6 F (36.4 C), temperature source Oral, resp. rate 15, weight 133 lb (60.3 kg), SpO2 100 %.  LABORATORY DATA: Lab Results  Component Value Date   WBC 5.2 11/18/2021   HGB 9.5 (L) 11/18/2021   HCT 30.2 (L) 11/18/2021   MCV 76.8 (L) 11/18/2021   PLT 131 (L) 11/18/2021      Chemistry      Component Value Date/Time   NA 136 11/18/2021 0853   NA 144 02/10/2020 0813   K 3.9 11/18/2021 0853   CL 106 11/18/2021 0853   CO2 26 11/18/2021 0853   BUN 16 11/18/2021 0853   BUN 10 02/10/2020 0813    CREATININE 0.74 11/18/2021 0853   CREATININE 0.96 07/22/2021 0936      Component Value Date/Time   CALCIUM 8.8 (L) 11/18/2021 0853   ALKPHOS 87 11/18/2021 0853   AST 29 11/18/2021 0853   ALT 26 11/18/2021 0853   BILITOT 0.3 11/18/2021 0853       RADIOGRAPHIC STUDIES: ECHOCARDIOGRAM COMPLETE  Result Date: 12/10/2021    ECHOCARDIOGRAM REPORT   Patient Name:   Katrina Campbell   Date of Exam: 12/10/2021 Medical Rec #:  [id number]     Height:       64.0 in Accession #:    [id number]    Weight:       134.2 lb Date of Birth:  05-Jun-1955      BSA:          1.651 m Patient Age:    31 years      BP:           150/70 mmHg Patient Gender: F             HR:           74 bpm. Exam Location:  [facility] Procedure: 2D Echo, 3D Echo, Cardiac Doppler, Color Doppler and Strain Analysis Indications:    I25.10 Coronary artery disease                 Z92.21 History of Chemotherapy  History:        Patient has prior history of Echocardiogram examinations, most                 recent 12/24/2020. Previous Myocardial Infarction and CAD, COPD;                 Risk Factors:Dyslipidemia.  Sonographer:    Cresenciano Lick RDCS Referring Phys: [id number] [place]  1. Left ventricular ejection fraction, by estimation, is 55 to 60%. The left ventricle has normal function. The left ventricle has no regional wall motion abnormalities. Left ventricular diastolic parameters are consistent with Grade I diastolic dysfunction (impaired relaxation). Elevated left ventricular end-diastolic pressure. The average left ventricular global longitudinal strain is -23.4 %. The global longitudinal strain is normal.  2. Right ventricular systolic function is normal. The right ventricular size is normal.  3. The mitral valve is normal in structure. Trivial mitral valve regurgitation. No evidence of mitral stenosis.  4. The aortic valve is tricuspid. Aortic valve regurgitation is mild. No aortic stenosis is present.  5. The inferior  vena cava is normal in size with greater than 50% respiratory variability, suggesting right atrial pressure of 3 mmHg. FINDINGS  Left Ventricle: Left ventricular ejection fraction, by estimation, is 55 to 60%. The left ventricle has normal function. The left  ventricle has no regional wall motion abnormalities. The average left ventricular global longitudinal strain is -23.4 %. The global longitudinal strain is normal. The left ventricular internal cavity size was normal in size. There is no left ventricular hypertrophy. Left ventricular diastolic parameters are consistent with Grade I diastolic dysfunction (impaired relaxation). Elevated left ventricular end-diastolic pressure. Right Ventricle: The right ventricular size is normal. No increase in right ventricular wall thickness. Right ventricular systolic function is normal. Left Atrium: Left atrial size was normal in size. Right Atrium: Right atrial size was normal in size. Pericardium: There is no evidence of pericardial effusion. Mitral Valve: The mitral valve is normal in structure. Trivial mitral valve regurgitation. No evidence of mitral valve stenosis. Tricuspid Valve: The tricuspid valve is normal in structure. Tricuspid valve regurgitation is trivial. No evidence of tricuspid stenosis. Aortic Valve: The aortic valve is tricuspid. Aortic valve regurgitation is mild. No aortic stenosis is present. Pulmonic Valve: The pulmonic valve was normal in structure. Pulmonic valve regurgitation is trivial. No evidence of pulmonic stenosis. Aorta: The aortic root is normal in size and structure. Venous: The inferior vena cava is normal in size with greater than 50% respiratory variability, suggesting right atrial pressure of 3 mmHg. IAS/Shunts: No atrial level shunt detected by color flow Doppler.  LEFT VENTRICLE PLAX 2D LVIDd:         4.50 cm   Diastology LVIDs:         3.10 cm   LV e' medial:    4.68 cm/s LV PW:         0.90 cm   LV E/e' medial:  17.2 LV IVS:         0.90 cm   LV e' lateral:   6.53 cm/s LVOT diam:     2.10 cm   LV E/e' lateral: 12.3 LV SV:         52 LV SV Index:   32        2D Longitudinal Strain LVOT Area:     3.46 cm  2D Strain GLS (A2C):   -24.9 %                          2D Strain GLS (A3C):   -22.1 %                          2D Strain GLS (A4C):   -23.2 %                          2D Strain GLS Avg:     -23.4 %                           3D Volume EF:                          3D EF:        60 %                          LV EDV:       117 ml                          LV ESV:       46 ml  LV SV:        70 ml RIGHT VENTRICLE             IVC RV Basal diam:  3.10 cm     IVC diam: 0.80 cm RV S prime:     16.35 cm/s TAPSE (M-mode): 2.2 cm LEFT ATRIUM             Index        RIGHT ATRIUM           Index LA diam:        3.40 cm 2.06 cm/m   RA Area:     10.70 cm LA Vol (A2C):   33.9 ml 20.53 ml/m  RA Volume:   25.70 ml  15.56 ml/m LA Vol (A4C):   19.6 ml 11.87 ml/m LA Biplane Vol: 28.1 ml 17.02 ml/m  AORTIC VALVE LVOT Vmax:   75.83 cm/s LVOT Vmean:  51.367 cm/s LVOT VTI:    0.151 m  AORTA Ao Root diam: 3.60 cm Ao Asc diam:  4.00 cm MITRAL VALVE               TRICUSPID VALVE MV Area (PHT): 3.45 cm    TR Peak grad:   23.2 mmHg MV Decel Time: 220 msec    TR Vmax:        241.00 cm/s MV E velocity: 80.45 cm/s MV A velocity: 97.65 cm/s  SHUNTS MV E/A ratio:  0.82        Systemic VTI:  0.15 m                            Systemic Diam: 2.10 cm Skeet Latch MD Electronically signed by Skeet Latch MD Signature Date/Time: 12/10/2021/4:27:41 PM    Final     ASSESSMENT AND PLAN: This is a very pleasant 66 years old white female recently diagnosed with a stage IIIA (TX, N2, M0) non-small cell lung cancer, squamous cell carcinoma presented with subcarinal mass/lymphadenopathy diagnosed in July 2022. The patient underwent a course of concurrent chemoradiation with weekly carboplatin for AUC of 2 and paclitaxel 45 Mg/M2 status post 7  cycles. The patient tolerated her treatment well except for fatigue and sore throat. The patient is currently undergoing consolidation treatment with immunotherapy with Imfinzi 1500 Mg IV every 4 weeks status post 12 cycles.   She has been tolerating this treatment well with no concerning adverse effects. I recommended for the patient to proceed with cycle #13 today as planned. I will see her back for follow-up visit in 1 months for evaluation with repeat CT scan of the chest for restaging of her disease. For the persistent anemia, I encouraged her to take oral iron tablets with vitamin C or orange juice twice daily. For the hypothyroidism, she is currently on Synthroid by her primary care physician and he is adjusting her medication. For the hypertension, her blood pressure was high today after being stuck 3 times by the phlebotomist for the IV access.  She will monitor it closely at home. She was advised to call immediately if she has any other concerning symptoms in the interval.  The patient voices understanding of current disease status and treatment options and is in agreement with the current care plan.  All questions were answered. The patient knows to call the clinic with any problems, questions or concerns. We can certainly see the patient much sooner if necessary.  Disclaimer: This note was dictated with voice  recognition software. Similar sounding words can inadvertently be transcribed and may not be corrected upon review.

## 2021-12-17 ENCOUNTER — Other Ambulatory Visit: Payer: Self-pay | Admitting: Medical

## 2021-12-17 ENCOUNTER — Encounter: Payer: Self-pay | Admitting: Cardiology

## 2021-12-17 ENCOUNTER — Telehealth: Payer: Self-pay | Admitting: Internal Medicine

## 2021-12-17 ENCOUNTER — Ambulatory Visit: Payer: Medicare Other | Attending: Cardiology | Admitting: Cardiology

## 2021-12-17 VITALS — BP 126/80 | HR 79 | Ht 64.0 in | Wt 132.0 lb

## 2021-12-17 DIAGNOSIS — E785 Hyperlipidemia, unspecified: Secondary | ICD-10-CM | POA: Diagnosis present

## 2021-12-17 DIAGNOSIS — I7121 Aneurysm of the ascending aorta, without rupture: Secondary | ICD-10-CM

## 2021-12-17 DIAGNOSIS — E782 Mixed hyperlipidemia: Secondary | ICD-10-CM

## 2021-12-17 DIAGNOSIS — C801 Malignant (primary) neoplasm, unspecified: Secondary | ICD-10-CM | POA: Diagnosis present

## 2021-12-17 DIAGNOSIS — I2510 Atherosclerotic heart disease of native coronary artery without angina pectoris: Secondary | ICD-10-CM

## 2021-12-17 DIAGNOSIS — Z9221 Personal history of antineoplastic chemotherapy: Secondary | ICD-10-CM | POA: Diagnosis present

## 2021-12-17 MED ORDER — LEVOTHYROXINE SODIUM 125 MCG PO TABS: 125.0000 ug | ORAL_TABLET | Freq: Every day | ORAL | 0 refills | Status: DC

## 2021-12-17 MED ORDER — LEVOTHYROXINE SODIUM 112 MCG PO TABS: 112.0000 ug | ORAL_TABLET | Freq: Every day | ORAL | 0 refills | Status: DC

## 2021-12-17 NOTE — Telephone Encounter (Signed)
Scheduled follow-up appointments per 9/14 los. Patient is aware.

## 2021-12-17 NOTE — Addendum Note (Signed)
Addended by: Juventino Slovak on: 12/17/2021 03:25 PM   Modules accepted: Orders

## 2021-12-17 NOTE — Patient Instructions (Signed)
Medication Instructions:   Your physician recommends that you continue on your current medications as directed. Please refer to the Current Medication list given to you today.  *If you need a refill on your cardiac medications before your next appointment, please call your pharmacy*   Follow-Up: At [facility], you and your health needs are our priority.  As part of our continuing mission to provide you with exceptional heart care, we have created designated Provider Care Teams.  These Care Teams include your primary Cardiologist (physician) and Advanced Practice Providers (APPs -  Physician Assistants and Nurse Practitioners) who all work together to provide you with the care you need, when you need it.  We recommend signing up for the patient portal called "MyChart".  Sign up information is provided on this After Visit Summary.  MyChart is used to connect with patients for Virtual Visits (Telemedicine).  Patients are able to view lab/test results, encounter notes, upcoming appointments, etc.  Non-urgent messages can be sent to your provider as well.   To learn more about what you can do with MyChart, go to [email or web address].    Your next appointment:   6 month(s)  The format for your next appointment:   In Person  Provider:   DR. Johney Frame  Important Information About Sugar

## 2021-12-18 ENCOUNTER — Other Ambulatory Visit: Payer: Self-pay

## 2021-12-21 ENCOUNTER — Other Ambulatory Visit (HOSPITAL_COMMUNITY): Payer: Medicare Other

## 2021-12-28 ENCOUNTER — Other Ambulatory Visit (HOSPITAL_COMMUNITY): Payer: Medicare Other

## 2022-01-11 ENCOUNTER — Encounter: Payer: Self-pay | Admitting: Internal Medicine

## 2022-01-14 ENCOUNTER — Other Ambulatory Visit: Payer: Self-pay

## 2022-01-14 ENCOUNTER — Inpatient Hospital Stay: Payer: Medicare Other | Attending: Hematology and Oncology

## 2022-01-14 ENCOUNTER — Ambulatory Visit (HOSPITAL_COMMUNITY)
Admission: RE | Admit: 2022-01-14 | Discharge: 2022-01-14 | Disposition: A | Discharge status: Home / Self Care | Payer: Medicare Other | Source: Ambulatory Visit | Attending: Internal Medicine | Admitting: Internal Medicine

## 2022-01-14 DIAGNOSIS — J449 Chronic obstructive pulmonary disease, unspecified: Secondary | ICD-10-CM | POA: Insufficient documentation

## 2022-01-14 DIAGNOSIS — I10 Essential (primary) hypertension: Secondary | ICD-10-CM | POA: Insufficient documentation

## 2022-01-14 DIAGNOSIS — E039 Hypothyroidism, unspecified: Secondary | ICD-10-CM | POA: Insufficient documentation

## 2022-01-14 DIAGNOSIS — D649 Anemia, unspecified: Secondary | ICD-10-CM | POA: Insufficient documentation

## 2022-01-14 DIAGNOSIS — C3490 Malignant neoplasm of unspecified part of unspecified bronchus or lung: Secondary | ICD-10-CM | POA: Insufficient documentation

## 2022-01-14 DIAGNOSIS — Z8701 Personal history of pneumonia (recurrent): Secondary | ICD-10-CM | POA: Insufficient documentation

## 2022-01-14 DIAGNOSIS — I252 Old myocardial infarction: Secondary | ICD-10-CM | POA: Insufficient documentation

## 2022-01-14 DIAGNOSIS — Z79899 Other long term (current) drug therapy: Secondary | ICD-10-CM | POA: Insufficient documentation

## 2022-01-14 DIAGNOSIS — C3401 Malignant neoplasm of right main bronchus: Secondary | ICD-10-CM | POA: Insufficient documentation

## 2022-01-14 LAB — CBC WITH DIFFERENTIAL (CANCER CENTER ONLY)
Abs Immature Granulocytes: 0.01 10*3/uL (ref 0.00–0.07)
Basophils Absolute: 0 10*3/uL (ref 0.0–0.1)
Basophils Relative: 0 %
Eosinophils Absolute: 0.1 10*3/uL (ref 0.0–0.5)
Eosinophils Relative: 2 %
HCT: 35.2 % — ABNORMAL LOW (ref 36.0–46.0)
Hemoglobin: 11.4 g/dL — ABNORMAL LOW (ref 12.0–15.0)
Immature Granulocytes: 0 %
Lymphocytes Relative: 21 %
Lymphs Abs: 1 10*3/uL (ref 0.7–4.0)
MCH: 26.8 pg (ref 26.0–34.0)
MCHC: 32.4 g/dL (ref 30.0–36.0)
MCV: 82.8 fL (ref 80.0–100.0)
Monocytes Absolute: 0.4 10*3/uL (ref 0.1–1.0)
Monocytes Relative: 9 %
Neutro Abs: 3.1 10*3/uL (ref 1.7–7.7)
Neutrophils Relative %: 68 %
Platelet Count: 136 10*3/uL — ABNORMAL LOW (ref 150–400)
RBC: 4.25 MIL/uL (ref 3.87–5.11)
RDW: 22 % — ABNORMAL HIGH (ref 11.5–15.5)
WBC Count: 4.6 10*3/uL (ref 4.0–10.5)
nRBC: 0 % (ref 0.0–0.2)

## 2022-01-14 LAB — CMP (CANCER CENTER ONLY)
ALT: 25 U/L (ref 0–44)
AST: 25 U/L (ref 15–41)
Albumin: 4.2 g/dL (ref 3.5–5.0)
Alkaline Phosphatase: 90 U/L (ref 38–126)
Anion gap: 3 — ABNORMAL LOW (ref 5–15)
BUN: 15 mg/dL (ref 8–23)
CO2: 29 mmol/L (ref 22–32)
Calcium: 9.3 mg/dL (ref 8.9–10.3)
Chloride: 110 mmol/L (ref 98–111)
Creatinine: 0.84 mg/dL (ref 0.44–1.00)
GFR, Estimated: >60 mL/min (ref >60)
Glucose, Bld: 93 mg/dL (ref 70–99)
Potassium: 4.1 mmol/L (ref 3.5–5.1)
Sodium: 142 mmol/L (ref 135–145)
Total Bilirubin: 0.3 mg/dL (ref 0.3–1.2)
Total Protein: 7 g/dL (ref 6.5–8.1)

## 2022-01-14 MED ORDER — SODIUM CHLORIDE (PF) 0.9 % IJ SOLN
INTRAMUSCULAR | Status: AC
  Filled 2022-01-14: qty 50

## 2022-01-14 MED ORDER — IOHEXOL 300 MG/ML  SOLN
75.0000 mL | Freq: Once | INTRAMUSCULAR | Status: AC | PRN
  Administered 2022-01-14: 75 mL via INTRAVENOUS

## 2022-01-17 ENCOUNTER — Inpatient Hospital Stay (HOSPITAL_BASED_OUTPATIENT_CLINIC_OR_DEPARTMENT_OTHER): Payer: Medicare Other | Admitting: Internal Medicine

## 2022-01-17 VITALS — BP 162/86 | HR 100 | Temp 98.4°F | Resp 16 | Wt 133.8 lb

## 2022-01-17 DIAGNOSIS — Z79899 Other long term (current) drug therapy: Secondary | ICD-10-CM | POA: Diagnosis not present

## 2022-01-17 DIAGNOSIS — C3401 Malignant neoplasm of right main bronchus: Secondary | ICD-10-CM | POA: Diagnosis present

## 2022-01-17 DIAGNOSIS — D649 Anemia, unspecified: Secondary | ICD-10-CM | POA: Diagnosis not present

## 2022-01-17 DIAGNOSIS — I252 Old myocardial infarction: Secondary | ICD-10-CM | POA: Diagnosis not present

## 2022-01-17 DIAGNOSIS — C3491 Malignant neoplasm of unspecified part of right bronchus or lung: Secondary | ICD-10-CM

## 2022-01-17 DIAGNOSIS — C3490 Malignant neoplasm of unspecified part of unspecified bronchus or lung: Secondary | ICD-10-CM | POA: Diagnosis not present

## 2022-01-17 DIAGNOSIS — Z8701 Personal history of pneumonia (recurrent): Secondary | ICD-10-CM | POA: Diagnosis not present

## 2022-01-17 DIAGNOSIS — I10 Essential (primary) hypertension: Secondary | ICD-10-CM | POA: Diagnosis not present

## 2022-01-17 DIAGNOSIS — E039 Hypothyroidism, unspecified: Secondary | ICD-10-CM | POA: Diagnosis not present

## 2022-01-17 DIAGNOSIS — J449 Chronic obstructive pulmonary disease, unspecified: Secondary | ICD-10-CM | POA: Diagnosis not present

## 2022-01-17 NOTE — Progress Notes (Signed)
[place] [phone]   [phone]  OFFICE PROGRESS NOTE  Katrina Hurl, Katrina Campbell [address] [place] [address]  DIAGNOSIS: Stage IIIA (TX, N2, M0) non-small cell lung cancer, squamous cell carcinoma presented with subcarinal mass/lymphadenopathy diagnosed in July 2022.   PRIOR THERAPY:  1) Weekly carboplatin for AUC of 2 and paclitaxel 45 mg/m2.  First dose expected on 11/02/2020.  Status post 7 cycles.  Last dose was given 12/14/2020. 2) Consolidation treatment with immunotherapy with Imfinzi 1500 Mg IV every 4 weeks.  First dose 01/14/2021.  Status post 13 cycles   CURRENT THERAPY: Observation.  INTERVAL HISTORY: Katrina Campbell 66 y.o. female returns to the clinic today for follow-up visit.  The patient is feeling fine today with no concerning complaints but was a little bit anxious about her scan results and her blood pressure was elevated.  She denied having any current chest pain, shortness of breath, cough or hemoptysis.  She has no nausea, vomiting, diarrhea or constipation.  She has no headache or visual changes.  She denied having any recent weight loss or night sweats.  She completed a course of consolidation treatment with immunotherapy.  The patient had repeat CT scan of the chest performed recently and she is here for evaluation and discussion of her scan results.  MEDICAL HISTORY: Past Medical History:  Diagnosis Date   Anxiety    COPD (chronic obstructive pulmonary disease) ([place])    Coronary artery disease    Dental staining 07/15/2019   Depression    History of radiation therapy    Right lung- 11/03/20-12/15/20- Dr. 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  MEDICATIONS:  Current Outpatient Medications  Medication Sig Dispense Refill   acetaminophen (TYLENOL) 500 MG tablet Take 500 mg by mouth every 6 (six) hours as needed for moderate pain or headache.     albuterol (PROVENTIL) (2.5 MG/3ML) 0.083% nebulizer solution Take 3 mLs (2.5 mg total) by nebulization every 6 (six) hours as needed for wheezing or shortness of breath. 540 mL 3   albuterol (VENTOLIN HFA) 108 (90 Base) MCG/ACT inhaler Inhale 2 puffs into the lungs every 6 (six) hours as needed for wheezing or shortness of breath.     aspirin EC 81 MG tablet Take 81 mg by mouth daily with lunch.      buPROPion (WELLBUTRIN XL) 300 MG 24 hr tablet TAKE 1 TABLET DAILY 90 tablet 1   Cholecalciferol (DIALYVITE VITAMIN D 5000) 125 MCG (5000 UT) capsule Take 5,000 Units by mouth daily.     DURVALUMAB IV Inject 1,500 mg into the vein every 28 (twenty-eight) days.     esomeprazole (NEXIUM) 40 MG capsule Take 1 capsule (40 mg total) by mouth daily. 90 capsule 3   Fluticasone-Umeclidin-Vilant (TRELEGY ELLIPTA) 100-62.5-25 MCG/ACT AEPB Inhale 1 puff into the lungs daily. 180 each 3   Guaifenesin (MUCINEX MAXIMUM STRENGTH) 1200 MG TB12 Take 1,200 mg by mouth 2 (two) times daily.     levothyroxine (SYNTHROID) 112 MCG tablet Take 1 tablet (112 mcg total) by mouth daily. 30 tablet 0   levothyroxine (SYNTHROID) 125 MCG tablet Take 1 tablet (125 mcg total) by mouth daily. 30 tablet 0   Magnesium 500 MG TABS Take 500 mg by  mouth daily with lunch.     Multiple Vitamins-Minerals (IMMUNE SUPPORT PO) Take 2 tablets by mouth daily.     Respiratory Therapy Supplies (FLUTTER) DEVI Use as directed 1 each 0   rosuvastatin (CRESTOR) 20 MG tablet Take 1 tablet (20 mg total) by mouth daily. 90 tablet 3   sodium chloride (OCEAN) 0.65 % SOLN nasal spray Place 1 spray into both nostrils daily as needed for congestion.     temazepam (RESTORIL) 15 MG capsule Take 1 capsule (15 mg total) by mouth at bedtime as needed. 90  capsule 1   vitamin B-12 (CYANOCOBALAMIN) 1000 MCG tablet Take 1,000 mcg by mouth daily.     No current facility-administered medications for this visit.   Facility-Administered Medications Ordered in Other Visits  Medication Dose Route Frequency Provider Last Rate Last Admin   Sonafine emulsion 1 application  1 application  Topical Once Gery Pray, MD        SURGICAL HISTORY:  Past Surgical History:  Procedure Laterality Date   BALLOON DILATION N/A 04/29/2021   Procedure: BALLOON DILATION;  Surgeon: Milus Banister, MD;  Location: [facility];  Service: Endoscopy;  Laterality: N/A;   BIOPSY  01/14/2021   Procedure: BIOPSY;  Surgeon: Garner Nash, DO;  Location: [facility] [facility];  Service: Pulmonary;;   BRONCHIAL BIOPSY  10/08/2020   Procedure: BRONCHIAL BIOPSIES;  Surgeon: Garner Nash, DO;  Location: [facility];  Service: Pulmonary;;   BRONCHIAL BRUSHINGS  10/08/2020   Procedure: BRONCHIAL BRUSHINGS;  Surgeon: Garner Nash, DO;  Location: [facility];  Service: Pulmonary;;   BRONCHIAL BRUSHINGS  01/14/2021   Procedure: BRONCHIAL BRUSHINGS;  Surgeon: Garner Nash, DO;  Location: [place] [facility] [facility];  Service: Pulmonary;;   BRONCHIAL WASHINGS  10/08/2020   Procedure: BRONCHIAL WASHINGS;  Surgeon: Garner Nash, DO;  Location: [place] [facility];  Service: Pulmonary;;   BRONCHIAL WASHINGS  01/14/2021   Procedure: BRONCHIAL WASHINGS;  Surgeon: Garner Nash, DO;  Location: [place] [facility];  Service: Pulmonary;;   ESOPHAGOGASTRODUODENOSCOPY (EGD) WITH PROPOFOL N/A 04/29/2021   Procedure: ESOPHAGOGASTRODUODENOSCOPY (EGD) WITH PROPOFOL;  Surgeon: Milus Banister, MD;  Location: [facility];  Service: Endoscopy;  Laterality: N/A;   FINE NEEDLE ASPIRATION  10/08/2020   Procedure: FINE NEEDLE ASPIRATION (FNA) LINEAR;  Surgeon: Garner Nash, DO;  Location: [place] [facility];  Service: Pulmonary;;   LEFT HEART CATH AND CORONARY ANGIOGRAPHY N/A 11/07/2017   Procedure: LEFT HEART CATH AND CORONARY  ANGIOGRAPHY;  Surgeon: Nelva Bush, MD;  Location: [facility];  Service: Cardiovascular;  Laterality: N/A;   NASAL SINUS SURGERY  1986   VIDEO BRONCHOSCOPY Bilateral 01/29/2019   Procedure: VIDEO BRONCHOSCOPY WITH FLUORO;  Surgeon: Marshell Garfinkel, MD;  Location: [facility];  Service: Cardiopulmonary;  Laterality: Bilateral;   VIDEO BRONCHOSCOPY Right 01/14/2021   Procedure: VIDEO BRONCHOSCOPY WITHOUT FLUORO;  Surgeon: Garner Nash, DO;  Location: [place];  Service: Pulmonary;  Laterality: Right;  possible cryotherapy   VIDEO BRONCHOSCOPY WITH ENDOBRONCHIAL ULTRASOUND N/A 10/08/2020   Procedure: VIDEO BRONCHOSCOPY WITH ENDOBRONCHIAL ULTRASOUND;  Surgeon: Garner Nash, DO;  Location: [facility];  Service: Pulmonary;  Laterality: N/A;    REVIEW OF SYSTEMS:  Constitutional: negative Eyes: negative Ears, nose, mouth, throat, and face: negative Respiratory: negative Cardiovascular: negative Gastrointestinal: negative Genitourinary:negative Integument/breast: negative Hematologic/lymphatic: negative Musculoskeletal:negative Neurological: negative Behavioral/Psych: negative Endocrine: negative Allergic/Immunologic: negative   PHYSICAL EXAMINATION: General appearance: alert, cooperative, and no distress Head: Normocephalic, without obvious abnormality, atraumatic Neck: no adenopathy, no JVD, supple,  symmetrical, trachea midline, and thyroid not enlarged, symmetric, no tenderness/mass/nodules Lymph nodes: Cervical, supraclavicular, and axillary nodes normal. Resp: clear to auscultation bilaterally Back: symmetric, no curvature. ROM normal. No CVA tenderness. Cardio: regular rate and rhythm, S1, S2 normal, no murmur, click, rub or gallop GI: soft, non-tender; bowel sounds normal; no masses,  no organomegaly Extremities: extremities normal, atraumatic, no cyanosis or edema Neurologic: Alert and oriented X 3, normal strength and tone. Normal symmetric reflexes.  Normal coordination and gait  ECOG PERFORMANCE STATUS: 1 - Symptomatic but completely ambulatory  Blood pressure (!) 162/86, pulse 100, temperature 98.4 F (36.9 C), temperature source Oral, resp. rate 16, weight 133 lb 12.8 oz (60.7 kg), SpO2 97 %.  LABORATORY DATA: Lab Results  Component Value Date   WBC 4.6 01/14/2022   HGB 11.4 (L) 01/14/2022   HCT 35.2 (L) 01/14/2022   MCV 82.8 01/14/2022   PLT 136 (L) 01/14/2022      Chemistry      Component Value Date/Time   NA 142 01/14/2022 1003   NA 144 02/10/2020 0813   K 4.1 01/14/2022 1003   CL 110 01/14/2022 1003   CO2 29 01/14/2022 1003   BUN 15 01/14/2022 1003   BUN 10 02/10/2020 0813   CREATININE 0.84 01/14/2022 1003   CREATININE 0.96 07/22/2021 0936      Component Value Date/Time   CALCIUM 9.3 01/14/2022 1003   ALKPHOS 90 01/14/2022 1003   AST 25 01/14/2022 1003   ALT 25 01/14/2022 1003   BILITOT 0.3 01/14/2022 1003       RADIOGRAPHIC STUDIES: CT Chest W Contrast  Result Date: 01/16/2022 CLINICAL DATA:  Restaging non-small cell lung cancer. History of chemotherapy, radiation therapy and immunotherapy. * Tracking Code: BO * EXAM: CT CHEST WITH CONTRAST TECHNIQUE: Multidetector CT imaging of the chest was performed during intravenous contrast administration. RADIATION DOSE REDUCTION: This exam was performed according to the departmental dose-optimization program which includes automated exposure control, adjustment of the mA and/or kV according to patient size and/or use of iterative reconstruction technique. CONTRAST:  42mL OMNIPAQUE IOHEXOL 300 MG/ML  SOLN COMPARISON:  Multiple prior imaging studies. The most recent chest CT is 731 2023 FINDINGS: Cardiovascular: The heart is normal in size. Small amount of pericardial fluid without overt effusion. No change since prior study. The aorta is normal in caliber. No dissection. Stable age advanced aortic and coronary artery calcifications. The pulmonary arteries appear normal.  Mediastinum/Nodes: No mediastinal or hilar mass or lymphadenopathy. Less fluid in the pericardial recesses. The esophagus is grossly normal. Lungs/Pleura: Stable emphysematous changes and areas of pulmonary scarring. No worrisome pulmonary lesions or new pulmonary nodules. Stable post treatment changes with atelectasis and radiation changes involving the right paramediastinal lung and right hilum. No findings suspicious for residual or recurrent tumor. Upper Abdomen: No significant upper abdominal findings. Stable aortic and branch vessel calcifications. No hepatic or adrenal gland lesions. Musculoskeletal: No breast masses, supraclavicular or axillary adenopathy. The bony thorax is intact. No worrisome lytic or sclerotic bone lesions. IMPRESSION: 1. Stable post treatment changes involving the right paramediastinal lung. No findings suspicious for residual or recurrent tumor. 2. No mediastinal or hilar mass or adenopathy. 3. Stable emphysematous changes and areas of pulmonary scarring. 4. Stable age advanced aortic and coronary artery calcifications. Aortic Atherosclerosis (ICD10-I70.0) and Emphysema (ICD10-J43.9). Electronically Signed   By: Marijo Sanes M.D.   On: 01/16/2022 18:34    ASSESSMENT AND PLAN: This is a very pleasant 66 years old white female recently  diagnosed with a stage IIIA (TX, N2, M0) non-small cell lung cancer, squamous cell carcinoma presented with subcarinal mass/lymphadenopathy diagnosed in July 2022. The patient underwent a course of concurrent chemoradiation with weekly carboplatin for AUC of 2 and paclitaxel 45 Mg/M2 status post 7 cycles. The patient tolerated her treatment well except for fatigue and sore throat. The patient completed a course of consolidation treatment with immunotherapy with Imfinzi 1500 Mg IV every 4 weeks status post 13 cycles.   She tolerated her previous treatment fairly well with no concerning adverse effects. The patient had repeat CT scan of the chest  performed recently.  I personally and independently reviewed the scan and discussed the result with the patient today. Her scan showed no concerning findings for residual or recurrent tumor. I recommended for her to continue on observation with repeat CT scan of the chest in 3 months. For the anemia, she will continue with the oral iron tablets with vitamin C. For the hypothyroidism she is currently on levothyroxine 125 mcg p.o. daily. For the hypertension, the patient will continue to monitor her blood pressure closely at home and discussed with her primary care physician for adjustment of her medication if needed. The patient was advised to call immediately if she has any other concerning symptoms in the interval.  The patient voices understanding of current disease status and treatment options and is in agreement with the current care plan.  All questions were answered. The patient knows to call the clinic with any problems, questions or concerns. We can certainly see the patient much sooner if necessary.  Disclaimer: This note was dictated with voice recognition software. Similar sounding words can inadvertently be transcribed and may not be corrected upon review.

## 2022-01-20 ENCOUNTER — Telehealth: Payer: Self-pay

## 2022-01-20 NOTE — Telephone Encounter (Signed)
Katrina Campbell received a T/C stating pt was very tired, fever, and just not feeling well.  Pt advised to make an appt with her PCP for an evaluation and pt agreed with this plan

## 2022-01-21 ENCOUNTER — Telehealth (INDEPENDENT_AMBULATORY_CARE_PROVIDER_SITE_OTHER): Payer: Medicare Other | Admitting: Medical

## 2022-01-21 ENCOUNTER — Other Ambulatory Visit: Payer: Medicare Other

## 2022-01-21 VITALS — BP 130/80 | HR 127 | Temp 99.3°F | Resp 22 | Wt 132.0 lb

## 2022-01-21 DIAGNOSIS — R051 Acute cough: Secondary | ICD-10-CM

## 2022-01-21 DIAGNOSIS — R5383 Other fatigue: Secondary | ICD-10-CM

## 2022-01-21 DIAGNOSIS — I700 Atherosclerosis of aorta: Secondary | ICD-10-CM

## 2022-01-21 DIAGNOSIS — E538 Deficiency of other specified B group vitamins: Secondary | ICD-10-CM

## 2022-01-21 DIAGNOSIS — J449 Chronic obstructive pulmonary disease, unspecified: Secondary | ICD-10-CM | POA: Diagnosis not present

## 2022-01-21 DIAGNOSIS — E039 Hypothyroidism, unspecified: Secondary | ICD-10-CM

## 2022-01-21 DIAGNOSIS — R0989 Other specified symptoms and signs involving the circulatory and respiratory systems: Secondary | ICD-10-CM

## 2022-01-21 DIAGNOSIS — I2510 Atherosclerotic heart disease of native coronary artery without angina pectoris: Secondary | ICD-10-CM

## 2022-01-21 DIAGNOSIS — E559 Vitamin D deficiency, unspecified: Secondary | ICD-10-CM

## 2022-01-21 DIAGNOSIS — R5381 Other malaise: Secondary | ICD-10-CM | POA: Diagnosis not present

## 2022-01-21 MED ORDER — PREDNISONE 20 MG PO TABS: 40.0000 mg | ORAL_TABLET | Freq: Every day | ORAL | 0 refills | Status: DC

## 2022-01-21 MED ORDER — AZITHROMYCIN 250 MG PO TABS: ORAL_TABLET | ORAL | 0 refills | Status: DC

## 2022-01-21 NOTE — Progress Notes (Addendum)
Subjective:     Patient ID: Katrina Campbell, female   DOB: November 09, 1955, 66 y.o.   MRN: [id number]  This visit type was conducted due to national recommendations for restrictions regarding the COVID-19 Pandemic (e.g. social distancing) in an effort to limit this patient's exposure and mitigate transmission in our community.  Due to their co-morbid illnesses, this patient is at least at moderate risk for complications without adequate follow up.  This format is felt to be most appropriate for this patient at this time.    Documentation for virtual audio and video telecommunications through [place] encounter:  The patient was located at home. The provider was located in the office. The patient did consent to this visit and is aware of possible charges through their insurance for this visit.  The other persons participating in this telemedicine service were none. Time spent on call was 20 minutes and in review of previous records 20 minutes total.  This virtual service is not related to other E/M service within previous 7 days.   HPI Chief Complaint  Patient presents with   cough    Cough, fatigue, and body aches, felt like Death on 2022/07/06. No energy, no appetite   Virtual for illness.   Had scan this past week regarding cancer follow up.  07/06/22 of this week 4 days ago, felt awful when she got up out of bed somewhat abruptly.   Having some cough she notes fatigue, some body aches, no energy.  No fever.  Feels some pain in back.  No chills.  Some nausea.   No vomiting. No diarrhea.  Maybe mild congestion.  No recent sick contacts, no recent young kid exposure.  Legs feel like mush or heavy when getting up to walk.   No chest pain, no palpations.   Every since 07/06/2022, been out of breath.  Pulse ox at home in past is usually 98%.   Not on oxygen at home but is compliant with inhalers as usual.     Use breathing treatments with nebulizer morning and evening.  Was on cough all day yesterday  which is not her norm.  She just changed to the increased dose of Synthroid 5 days ago.  Now on 125 mcg daily.  No other aggravating or relieving factors. No other complaint.   Past Medical History:  Diagnosis Date   Anxiety    COPD (chronic obstructive pulmonary disease) ([place])    Coronary artery disease    Dental staining 07/15/2019   Depression    History of radiation therapy    Right lung- 11/03/20-12/15/20- Dr. 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     albuterol (PROVENTIL) (2.5 MG/3ML) 0.083% nebulizer solution Take 3 mLs (2.5 mg total) by nebulization every 6 (six) hours as needed for wheezing or shortness of breath. 540 mL 3   albuterol (VENTOLIN HFA) 108 (90 Base) MCG/ACT inhaler Inhale 2 puffs into the lungs every 6 (six) hours as needed for wheezing or shortness of breath.     aspirin EC 81 MG tablet Take 81 mg by mouth daily with lunch.      buPROPion (WELLBUTRIN XL) 300 MG 24 hr  tablet TAKE 1 TABLET DAILY 90 tablet 1   Cholecalciferol (DIALYVITE VITAMIN D 5000) 125 MCG (5000 UT) capsule Take 5,000 Units by mouth daily.     esomeprazole (NEXIUM) 40 MG capsule Take 1 capsule (40 mg total) by mouth daily. 90 capsule 3   Fluticasone-Umeclidin-Vilant (TRELEGY ELLIPTA) 100-62.5-25 MCG/ACT AEPB Inhale 1 puff into the lungs daily. 180 each 3   Guaifenesin (MUCINEX MAXIMUM STRENGTH) 1200 MG TB12 Take 1,200 mg by mouth 2 (two) times daily.     levothyroxine (SYNTHROID) 112 MCG tablet Take 1 tablet (112 mcg total) by mouth daily. 30 tablet 0   Multiple Vitamins-Minerals (IMMUNE SUPPORT PO) Take 2 tablets by mouth daily.     rosuvastatin (CRESTOR) 20 MG tablet  Take 1 tablet (20 mg total) by mouth daily. 90 tablet 3   temazepam (RESTORIL) 15 MG capsule Take 1 capsule (15 mg total) by mouth at bedtime as needed. 90 capsule 1   vitamin B-12 (CYANOCOBALAMIN) 1000 MCG tablet Take 1,000 mcg by mouth daily.     Respiratory Therapy Supplies (FLUTTER) DEVI Use as directed 1 each 0   Current Facility-Administered Medications on File Prior to Visit  Medication Dose Route Frequency Provider Last Rate Last Admin   Sonafine emulsion 1 application  1 application  Topical Once Gery Pray, MD        Review of Systems As in subjective    Objective:   Physical Exam Due to coronavirus pandemic stay at home measures, patient visit was virtual and they were not examined in person.   BP 130/80   Pulse (!) 127   Temp 99.3 F (37.4 C)   Resp (!) 22   Wt 132 lb (59.9 kg)   SpO2 (!) 88%   BMI 22.66 kg/m   Wt Readings from Last 3 Encounters:  01/21/22 132 lb (59.9 kg)  01/17/22 133 lb 12.8 oz (60.7 kg)  12/17/21 132 lb (59.9 kg)   97% at rest  Gen: wd, wn, nad, seated on table HEENT unremarkable, pharynx normal, TMs normal, nares patent Lungs with decreased breath sounds in general but no wheezes, no rhonchi or rales Heart: A little tachycardic but normal S1-S2, no murmurs No calf tenderness no calf swelling, no palpable cord, negative [place] Pulses tachycardic but otherwise 2+ UE and LE Neck: Supple, nontender, no JVD or bruits, no thyromegaly  CT chest 01/14/22 IMPRESSION: 1. Stable post treatment changes involving the right paramediastinal lung. No findings suspicious for residual or recurrent tumor. 2. No mediastinal or hilar mass or adenopathy. 3. Stable emphysematous changes and areas of pulmonary scarring. 4. Stable age advanced aortic and coronary artery calcifications.   Echocardiogram 12/10/21 IMPRESSIONS Left ventricular ejection fraction, by estimation, is 55 to 60%. The left ventricle has normal function. The left ventricle has no  regional wall motion abnormalities. Left ventricular diastolic parameters are consistent with Grade I diastolic dysfunction (impaired relaxation). Elevated left ventricular end-diastolic pressure. The average left ventricular global longitudinal strain is -23.4 %. The global longitudinal strain is normal. 1. 2. Right ventricular systolic function is normal. The right ventricular size is normal. The mitral valve is normal in structure. Trivial mitral valve regurgitation. No evidence of mitral stenosis. 3. The aortic valve is tricuspid. Aortic valve regurgitation is mild. No aortic stenosis is present. 4. The inferior vena cava is normal in size with greater than 50% respiratory variability, suggesting right atrial pressure of 3 mmHg.     Assessment:     Encounter Diagnoses  Name Primary?  Malaise Yes   Other fatigue    Acute cough    Stage 2 moderate COPD by GOLD classification ([place])    Coronary artery disease involving native coronary artery of native heart without angina pectoris    B12 deficiency    Atherosclerosis of aorta ([facility])    Vitamin D deficiency    Hypothyroidism, unspecified type    Labile hypertension        Plan:     She initially was negative for flu and COVID in the parking lot with testing  We brought her in for evaluation in person.  Her oxygen at rest was 97% but when she moves around a lot or walks around it drops.  I gave her the option of going to the emergency department for likely COPD flare , but she does not want to do that.  We will begin the recommendations as below.  I advised if any worse over the weekend to go to the hospital or call 9 1 particular of oxygen at rest is staying low close to 92% or less or worse symptoms in general.  She wanted to hold off on other imaging or labs today.  She has underlying COPD, recently ended her cancer therapy.  I reviewed her echocardiogram for December 10, 2021 as well as her CT chest just done 7 days ago.   I also reviewed her recent blood work in the chart record from a week ago for comprehensive metabolic and CBC  Symptoms more than likely represents COPD flare.  We discussed other differential.  We will plan to recheck her thyroid labs in about a month after being on the 125 mcg dose of levothyroxine.  Corey was seen today for cough.  Diagnoses and all orders for this visit:  Malaise  Other fatigue  Acute cough  Stage 2 moderate COPD by GOLD classification ([place])  Coronary artery disease involving native coronary artery of native heart without angina pectoris  B12 deficiency  Atherosclerosis of aorta ([facility])  Vitamin D deficiency  Hypothyroidism, unspecified type  Labile hypertension  Other orders -     azithromycin (ZITHROMAX) 250 MG tablet; 2 tablets day 1, then 1 tablet days 2-4 -     predniSONE (DELTASONE) 20 MG tablet; Take 2 tablets (40 mg total) by mouth daily with breakfast. .  F/u in 69mo, sooner prn

## 2022-01-21 NOTE — Patient Instructions (Signed)
Recommendations Take Z-Pak antibiotic for 5 days Begin prednisone 2 tablets daily for 5 days as planned.  However if you start feeling funny or unusual on this just take either 1 tablet 20 mg or 1/2 tablet 10 mg daily instead.  If no adverse effects then do the 2 tablets or 40 mg daily for 5 days as planned. Rest Hydrate well and do not get dehydrated You can continue the guaifenesin for mucus Continue your your normal inhalers but you can increase your nebulizer to 4 times a day if needed the next few days Monitor your oxygen.  If you are running less than 90% at rest or if you get worse over the next 3 to 5 days then go to the hospital

## 2022-01-25 ENCOUNTER — Telehealth: Payer: Self-pay | Admitting: Internal Medicine

## 2022-01-25 NOTE — Telephone Encounter (Signed)
Scheduled per 10/16 los, patient has been called and notified.

## 2022-01-27 ENCOUNTER — Ambulatory Visit: Payer: Medicare Other | Admitting: Infectious Diseases

## 2022-01-28 ENCOUNTER — Ambulatory Visit (INDEPENDENT_AMBULATORY_CARE_PROVIDER_SITE_OTHER): Payer: Medicare Other | Admitting: Infectious Diseases

## 2022-01-28 ENCOUNTER — Encounter: Payer: Self-pay | Admitting: Infectious Diseases

## 2022-01-28 ENCOUNTER — Other Ambulatory Visit: Payer: Self-pay

## 2022-01-28 VITALS — BP 143/90 | HR 72 | Temp 97.7°F | Ht 64.0 in | Wt 134.0 lb

## 2022-01-28 DIAGNOSIS — A439 Nocardiosis, unspecified: Secondary | ICD-10-CM

## 2022-01-28 DIAGNOSIS — A310 Pulmonary mycobacterial infection: Secondary | ICD-10-CM

## 2022-01-28 NOTE — Progress Notes (Signed)
[facility]                                                             [place], [place], [place], [address]                                                                  Phn. [phone]; Fax: [phone]                                                                             Date: 08/25/21  Reason for Follow up: [name] in sputum cultures   Assessment Stage IIIA non-small cell lung cancer, squamous cell carcinoma diagnosed in July 2022: s/p chemo and radiation, ongoing Immunotherapy. 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 smoker  Pertinent Microbiology  08/18/21 sputum smear negative, cx NGTD 07/22/21 sputum smear negative, cx NGTD 06/28/21 Resp cultures Stenotrophomonas maltophilia 06/24/21 AFB smear negative and cx negative 06/23/21 AFB smear negaive,prelim cx negative  05/10/21 sputum cx Nocardia asteroides complex 01/14/21  BAL AFB smear negative, Cx grew MAC; 40,000 colonies Neisseria sicca 07/06/20 sputum  Hemophilus parainfluenzae 11/26/19 Sputum culture smear and culture negative for MAC 07/16/19 Sputum culture AFB smear-negative, culture positive for MAC 01/29/19 BAL culture AFB smear negative and Cx growing MAC 09/30/18 Stenotrophomonas maltiphilia, yeast   Plan Given her most recent AFB sputum cx and smear remain negative without any targeted treatment for Nocardia/NTM + stable symptoms + non worsening CT chest findings, continue to monitor. Further need to image per Oncology  Fu with Pulmonary as well as Oncology as instructed  Fu Final AFB sputum cultures  Fu in 5 months or as needed  All questions and concerns were discussed and addressed. Patient verbalized understanding of the  plan. 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 She was having chronic cough at that time with other possible consideration for COPD, lung nodules and ? GERD for her cough. BAL 01/29/2019 AFB smear negative but cx grew MAC. She was eventually started on triple therapy three times a week Azithromycin 521m, Rifampin 6034mand ethambutol 160057mon 02/25/2019,. She was changed from three times a week to daily regimen on 04/15/2019 given no significant improvement in cough which reportedly improved the cough.  Treatment course complicated  by dental staining with Rifampin  which was stopped around 07/2019 with a plan to switch to clofazimine. Patient did not want to take clofazimine and continued taking Rifampin and eventually switched to Clofazimine on 11/26/2019. Cough had become worse since switch to clofazimine and patient was referred to [name] for 2nd opinion. Seen by [name] on 01/13/20. [name] was not entirely convinced about Pulmonary MAC infection and stopped clofazimine, resumed rifampin, decreased dose of azithromycin from 547m PO daily to 2567mPO daily and continued on ethambutol. Also referred to pulmonary rehab and gave additional cups for sputum AFB cultures. She was last seen by [name] 05/28/20 when she had improvement in cough but chronic nausea. She had already stopped taking anti mycobacterial abtx at that time ( she tells me she stopped in Nov 2021). It was planned to monitor off tx at that visit. Patient wants to see a different provider  this time.   Unfortunately, she was diagnosed with squamous cell lung ca in July 2022 s/p chemoradiation and is undergoing immunotherapy with Dr MoJulien NordmannThere is a plan for repeat CT chest after # cycle 3 and possible consideration for systemic chemotherapy and radiation if disease progression or no disease improvement  She has worsening SOB for the last 2 weeks. Feels like there is a lemon inside the throat and she is having hard time breathing. She can't take a deep breath. Prior to 2 weeks, she was able to walk in the tread mill for 30 minutes without any issues on a regular basis. She denies coughing and denies producing phlegm. Denies chest pain. She thinks her SOB has acutely changed in the last 2 weeks and her breathing was relatively stable prior to that. At home, her husband does cooking, she helps with dishes and cleaning house but has to take small breaks in between. She does not walk much/climb stairs.   Denies fevers, chills and sweats. Appetite is good and tells she has gained 4 lbs.  Denies nausea, vomiting, abdominal pain and diarrhea Denies any GU symptoms Denies any headache and blurry vision.   Ex , smoker, quit in 2014. Used to smoke 1.5 pack of cigarettes for 35 years. Used to drink alcohol socially which she has quit 6 years ago. Denies IVDU. Lives with her husband. She used to do an office work previously. She used to have dog before, denies contact with farm animals. Denies travel out of the country and has not traveled any where that she can think of except to beaches.   07/07/21 Last seen by [name]/21 for sputum culture growing Nocardia asetroides where this was thought to be a contaminant and more sputum cultures were collected. AF sputum smear and cx 3/22 and 23 prelim with negative smear and cx so far, respiratory cultures 3/27 however grew Stenotrophomonas maltophilia. Symptomatically, she tells me no different than when I saw her last. However, she was exhausted last 2  days with minimal occasional chest pain. She also tells me she was started on a new medication " armour thyroid" for last 2 days and considering if that could be the cause. She denies fevers, chills and sweats. Denies nausea, vomiting and diarrhea. She has no cough, she had to use peppermint to help cough to bring sputum to the office as she does not have cough. Her appetite is OK. Weight has been stable. She uses her trelegy first thing in the morning and takes a deep breath and has headache later  but goes away in the couple of hours. She is still able to walk 30 minutes in a tread mill both up and down. Her husband does cooking and grocery and she does the remaining household chores. Seen in the ED  for episode of choking. CT chest showed Near complete re-expansion of the right upper lobe  08/23/21 After last seen on 4/4, has completed 2 weeks course of PO Bactrim without missing doses or any issues with abtx. No change in symptoms since last visit except some fatigue. She can do Treadmill 30 minutes a day without interruption. Denies cough or phlegm. Mild SOB when overexerting, able to do cleaning, mopping, laundry, washing cars etc. Completed 2 weeks of bactrim. Tells me she was started on 4 different pills for thyroid d/o. Seen by Oncology 4/27, currently on consolidation immunotherapy with Imfinzi 1572m IV every 4 weeks, last dose 4/27. Seen by Pulmonary Dr IValeta Harms5/1 where her symptoms were reported to be stable No plans for bronch/BAL currently. Discussed CT chest findings.   Last ca tx in sept , CT chest . Every 3 months . Short winded sometines, comes and goes, Breathing same, treadmill 30 minutes,   ROS: Constitutional: Negative for fever, chills,  appetite change, fatigue + and unexpected weight change.  Respiratory: Negative for cough Cardiovascular: Negative for chest pain, palpitations and leg swelling.  Gastrointestinal: Negative for nausea, vomiting, abdominal pain, diarrhea/constipation,  .  Genitourinary: Negative for dysuria, hematuria, flank pain Musculoskeletal: Negative for myalgias, arthralgia,  joint swelling, arthralgias Skin: Negative for rashes, lesions  Neurological: Negative for weakness, dizziness or headache  Past Medical History:  Diagnosis Date   Anxiety    COPD (chronic obstructive pulmonary disease) [place]    Coronary artery disease    Dental staining 07/15/2019   Depression    History of radiation therapy    Right lung- 11/03/20-12/15/20- Dr. JGery Pray  Hyperlipidemia    Labile hypertension    Myocardial infarction ([facility] 2018   Pneumonia    Pulmonary mycobacterial infection [place] 10/24/2018   Past Surgical History:  Procedure Laterality Date   BALLOON DILATION N/A 04/29/2021   Procedure: BALLOON DILATION;  Surgeon: JMilus Banister MD;  [facility];  Service: Endoscopy;  Laterality: N/A;   BIOPSY  01/14/2021   Procedure: BIOPSY;  Surgeon: IGarner Nash DO;  [facility];  Service: Pulmonary;;   BRONCHIAL BIOPSY  10/08/2020   Procedure: BRONCHIAL BIOPSIES;  Surgeon: IGarner Nash DO;  [facility];  Service: Pulmonary;;   BRONCHIAL BRUSHINGS  10/08/2020   Procedure: BRONCHIAL BRUSHINGS;  Surgeon: IGarner Nash DO;  [facility];  Service: Pulmonary;;   BRONCHIAL BRUSHINGS  01/14/2021   Procedure: BRONCHIAL BRUSHINGS;  Surgeon: IGarner Nash DO;  [facility];  Service: Pulmonary;;   BRONCHIAL WASHINGS  10/08/2020   Procedure: BRONCHIAL WASHINGS;  Surgeon: IGarner Nash DO;  [facility];  Service: Pulmonary;;   BRONCHIAL WASHINGS  01/14/2021   Procedure: BRONCHIAL WASHINGS;  Surgeon: IGarner Nash DO;  [facility];  Service: Pulmonary;;   ESOPHAGOGASTRODUODENOSCOPY (EGD) WITH PROPOFOL N/A 04/29/2021   Procedure: ESOPHAGOGASTRODUODENOSCOPY (EGD) WITH PROPOFOL;  Surgeon: JMilus Banister MD;  [facility];  Service: Endoscopy;  Laterality: N/A;    FINE NEEDLE ASPIRATION  10/08/2020   Procedure: FINE NEEDLE ASPIRATION (FNA) LINEAR;  Surgeon: IGarner Nash DO;  [facility]  Service: Pulmonary;;   LEFT HEART CATH AND CORONARY ANGIOGRAPHY N/A 11/07/2017   Procedure:  LEFT HEART CATH AND CORONARY ANGIOGRAPHY;  Surgeon: Nelva Bush, MD;  Location: [place] [facility];  Service: Cardiovascular;  Laterality: N/A;   NASAL SINUS SURGERY  1986   VIDEO BRONCHOSCOPY Bilateral 01/29/2019   Procedure: VIDEO BRONCHOSCOPY WITH FLUORO;  Surgeon: Marshell Garfinkel, MD;  Location: [place];  Service: Cardiopulmonary;  Laterality: Bilateral;   VIDEO BRONCHOSCOPY Right 01/14/2021   Procedure: VIDEO BRONCHOSCOPY WITHOUT FLUORO;  Surgeon: Garner Nash, DO;  Location: [place];  Service: Pulmonary;  Laterality: Right;  possible cryotherapy   VIDEO BRONCHOSCOPY WITH ENDOBRONCHIAL ULTRASOUND N/A 10/08/2020   Procedure: VIDEO BRONCHOSCOPY WITH ENDOBRONCHIAL ULTRASOUND;  Surgeon: Garner Nash, DO;  Location: [place];  Service: Pulmonary;  Laterality: N/A;   Current Outpatient Medications on File Prior to Visit  Medication Sig Dispense Refill   acetaminophen (TYLENOL) 500 MG tablet Take 500 mg by mouth every 6 (six) hours as needed for moderate pain or headache.     albuterol (PROVENTIL) (2.5 MG/3ML) 0.083% nebulizer solution Take 3 mLs (2.5 mg total) by nebulization every 6 (six) hours as needed for wheezing or shortness of breath. 540 mL 3   albuterol (VENTOLIN HFA) 108 (90 Base) MCG/ACT inhaler Inhale 2 puffs into the lungs every 6 (six) hours as needed for wheezing or shortness of breath.     aspirin EC 81 MG tablet Take 81 mg by mouth daily with lunch.      buPROPion (WELLBUTRIN XL) 300 MG 24 hr tablet TAKE 1 TABLET DAILY 90 tablet 1   Cholecalciferol (DIALYVITE VITAMIN D 5000) 125 MCG (5000 UT) capsule Take 5,000 Units by mouth daily.     esomeprazole (NEXIUM) 40 MG capsule Take 1 capsule (40 mg total) by mouth daily. 90 capsule  3   Fluticasone-Umeclidin-Vilant (TRELEGY ELLIPTA) 100-62.5-25 MCG/ACT AEPB Inhale 1 puff into the lungs daily. 180 each 3   Guaifenesin (MUCINEX MAXIMUM STRENGTH) 1200 MG TB12 Take 1,200 mg by mouth 2 (two) times daily.     levothyroxine (SYNTHROID) 112 MCG tablet Take 1 tablet (112 mcg total) by mouth daily. 30 tablet 0   Multiple Vitamins-Minerals (IMMUNE SUPPORT PO) Take 2 tablets by mouth daily.     Respiratory Therapy Supplies (FLUTTER) DEVI Use as directed 1 each 0   rosuvastatin (CRESTOR) 20 MG tablet Take 1 tablet (20 mg total) by mouth daily. 90 tablet 3   temazepam (RESTORIL) 15 MG capsule Take 1 capsule (15 mg total) by mouth at bedtime as needed. 90 capsule 1   vitamin B-12 (CYANOCOBALAMIN) 1000 MCG tablet Take 1,000 mcg by mouth daily.     Current Facility-Administered Medications on File Prior to Visit  Medication Dose Route Frequency Provider Last Rate Last Admin   Sonafine emulsion 1 application  1 application  Topical Once Gery Pray, MD         Allergies  Allergen Reactions   Breztri Aerosphere [Budeson-Glycopyrrol-Formoterol] Shortness Of Breath   Benadryl [Diphenhydramine] Nausea Only   Codeine Other (See Comments)    Causes pt to blackout   Prednisone Other (See Comments)    Passed out   Statins     Myalgias at higher doses   Social History   Socioeconomic History   Marital status: Married    Spouse name: Not on file   Number of children: Not on file   Years of education: Not on file   Highest education level: Not on file  Occupational History   Not on file  Tobacco Use   Smoking status: Former  Packs/day: 1.75    Years: 44.00    Total pack years: 77.00    Types: Cigarettes    Quit date: 11/09/2012    Years since quitting: 9.2   Smokeless tobacco: Never   Tobacco comments:    vapor  Vaping Use   Vaping Use: Former  Substance and Sexual Activity   Alcohol use: Not Currently    Alcohol/week: 24.0 standard drinks of alcohol    Types: 24 Cans  of beer per week   Drug use: Not Currently   Sexual activity: Not Currently  Other Topics Concern   Not on file  Social History Narrative   Not on file   Social Determinants of Health   Financial Resource Strain: Medium Risk (10/29/2020)   Overall Financial Resource Strain (CARDIA)    Difficulty of Paying Living Expenses: Somewhat hard  Food Insecurity: No Food Insecurity (10/29/2020)   Hunger Vital Sign    Worried About Running Out of Food in the Last Year: Never true    Ran Out of Food in the Last Year: Never true  Transportation Needs: No Transportation Needs (10/29/2020)   PRAPARE - Hydrologist (Medical): No    Lack of Transportation (Non-Medical): No  Physical Activity: Not on file  Stress: Stress Concern Present (10/29/2020)   [place]    Feeling of Stress : Rather much  Social Connections: Unknown (10/29/2020)   Social Connection and Isolation Panel [NHANES]    Frequency of Communication with Friends and Family: More than three times a week    Frequency of Social Gatherings with Friends and Family: More than three times a week    Attends Religious Services: More than 4 times per year    Active Member of [employer] or Organizations: Not on file    Attends Music therapist: Not on file    Marital Status: Married  Human resources officer Violence: Not on file   Family History  Problem Relation Age of Onset   Heart attack Mother    Emphysema Mother    Congestive Heart Failure Mother    Heart attack Father    Congestive Heart Failure Father    Asthma Other    Lung disease Sister    Lung disease Brother    Colon cancer Neg Hx    Vitals BP (!) 143/90   Pulse 72   Temp 97.7 F (36.5 C) (Temporal)   Ht _0  (1.626 m)   Wt 134 lb (60.8 kg)   BMI 23.00 kg/m   Examination  General - not in acute distress, comfortably sitting in chair HEENT - PEERLA, no pallor and no  icterus Chest - Bilateral equal air entry with no additional sounds  CVS- Normal s1s2, RRR Abdomen - Soft, Non tender , non distended Ext- no pedal edema Neuro: grossly normal Psych : calm and cooperative   Recent labs    Latest Ref Rng & Units 01/14/2022   10:03 AM 12/16/2021    9:30 AM 11/18/2021    8:53 AM  CBC  WBC 4.0 - 10.5 K/uL 4.6  4.6  5.2   Hemoglobin 12.0 - 15.0 g/dL 11.4  9.2  9.5   Hematocrit 36.0 - 46.0 % 35.2  29.4  30.2   Platelets 150 - 400 K/uL 136  136  131       Latest Ref Rng & Units 01/14/2022   10:03 AM 12/16/2021    9:30 AM 11/18/2021  8:53 AM  CMP  Glucose 70 - 99 mg/dL 93  90  79   BUN 8 - 23 mg/dL _0 Creatinine 0.44 - 1.00 mg/dL 0.84  0.69  0.74   Sodium 135 - 145 mmol/L 142  134  136   Potassium 3.5 - 5.1 mmol/L 4.1  3.8  3.9   Chloride 98 - 111 mmol/L 110  103  106   CO2 22 - 32 mmol/L _1 Calcium 8.9 - 10.3 mg/dL 9.3  8.8  8.8   Total Protein 6.5 - 8.1 g/dL 7.0  6.6  6.9   Total Bilirubin 0.3 - 1.2 mg/dL 0.3  0.4  0.3   Alkaline Phos 38 - 126 U/L 90  88  87   AST 15 - 41 U/L 25  33  29   ALT 0 - 44 U/L _2 Pertinent Microbiology Results for orders placed or performed in visit on 08/18/21   MYCOBACTERIA, CULTURE, WITH FLUOROCHROME SMEAR     Status: None   Collection Time: 08/18/21 11:48 AM   Specimen: Sputum  Result Value Ref Range Status   MICRO NUMBER: [id number]  Final   SPECIMEN QUALITY: Adequate  Final   Source: SPUTUM  Final   STATUS: FINAL  Final   SMEAR:   Final    No acid-fast bacilli seen using the fluorochrome method.   RESULT:   Final    No Mycobacterium species isolated after 6 weeks incubation.     Pertinent Imaging CT Chest W Contrast  Result Date: 01/16/2022 CLINICAL DATA:  Restaging non-small cell lung cancer. History of chemotherapy, radiation therapy and immunotherapy. * Tracking Code: BO * EXAM: CT CHEST WITH CONTRAST TECHNIQUE: Multidetector CT imaging of the chest was performed  during intravenous contrast administration. RADIATION DOSE REDUCTION: This exam was performed according to the departmental dose-optimization program which includes automated exposure control, adjustment of the mA and/or kV according to patient size and/or use of iterative reconstruction technique. CONTRAST:  39m OMNIPAQUE IOHEXOL 300 MG/ML  SOLN COMPARISON:  Multiple prior imaging studies. The most recent chest CT is 731 2023 FINDINGS: Cardiovascular: The heart is normal in size. Small amount of pericardial fluid without overt effusion. No change since prior study. The aorta is normal in caliber. No dissection. Stable age advanced aortic and coronary artery calcifications. The pulmonary arteries appear normal. Mediastinum/Nodes: No mediastinal or hilar mass or lymphadenopathy. Less fluid in the pericardial recesses. The esophagus is grossly normal. Lungs/Pleura: Stable emphysematous changes and areas of pulmonary scarring. No worrisome pulmonary lesions or new pulmonary nodules. Stable post treatment changes with atelectasis and radiation changes involving the right paramediastinal lung and right hilum. No findings suspicious for residual or recurrent tumor. Upper Abdomen: No significant upper abdominal findings. Stable aortic and branch vessel calcifications. No hepatic or adrenal gland lesions. Musculoskeletal: No breast masses, supraclavicular or axillary adenopathy. The bony thorax is intact. No worrisome lytic or sclerotic bone lesions. IMPRESSION: 1. Stable post treatment changes involving the right paramediastinal lung. No findings suspicious for residual or recurrent tumor. 2. No mediastinal or hilar mass or adenopathy. 3. Stable emphysematous changes and areas of pulmonary scarring. 4. Stable age advanced aortic and coronary artery calcifications. Aortic Atherosclerosis (ICD10-I70.0) and Emphysema (ICD10-J43.9). Electronically Signed   By: PMarijo SanesM.D.   On: 01/16/2022 18:34    All pertinent  labs/Imagings/notes reviewed. All pertinent plain films and CT images  have been personally visualized and interpreted; radiology reports have been reviewed. Decision making incorporated into the Impression / Recommendations.  07/30/21 CT Chest  IMPRESSION: 1. Interval atelectasis of the right upper lobe. 2. Interval development of an 8 mm nodule within the left lower lobe, potentially infectious/inflammatory in etiology. 3. Significant interval improvement of previously described nodularity within the right lower lobe, most compatible with improving infectious/inflammatory process. 4. Given the new right upper lobe atelectasis and left lower lobe nodule as well as improving right lower lobe nodules, recommend additional follow-up chest CT in 4-6 weeks to assess for interval improvement/resolution. Additionally recommend clinical correlation for new right upper lobe atelectasis. 5. Similar-appearing low-attenuation lesions within the liver. Recommend attention on follow-up CTs. 6. Ascending thoracic aorta measures 4 cm. Recommend annual imaging followup by CTA or MRA. This recommendation follows 2010 ACCF/AHA/AATS/ACR/ASA/SCA/SCAI/SIR/STS/SVM Guidelines for the Diagnosis and Management of Patients with Thoracic Aortic Disease. Circulation. 2010; 121: K354-S568. Aortic aneurysm NOS (ICD10-I71.9) 7. Aortic Atherosclerosis (ICD10-I70.0) and Emphysema (ICD10-J43.9).  I have spent a total of 45 minutes of face-to-face and non-face-to-face time, excluding clinical staff time, preparing to see patient, ordering tests and/or medications, and provide counseling the patient    Electronically signed by:  Rosiland Oz, MD Infectious Disease Physician [facility] [address] [place], [place] [address] Phone: [phone]  Fax: [phone]

## 2022-02-03 ENCOUNTER — Other Ambulatory Visit: Payer: Self-pay | Admitting: Medical

## 2022-02-14 ENCOUNTER — Telehealth: Payer: Self-pay

## 2022-02-14 ENCOUNTER — Other Ambulatory Visit: Payer: Self-pay | Admitting: Medical

## 2022-02-14 ENCOUNTER — Other Ambulatory Visit: Payer: Medicare Other

## 2022-02-14 DIAGNOSIS — E039 Hypothyroidism, unspecified: Secondary | ICD-10-CM

## 2022-02-14 NOTE — Telephone Encounter (Signed)
Pt. Called stating you wanted her to come back and recheck her thyroid levels after being on her new dose for a while. She is going out of town so she wanted to come in this am at 11:30. I put her down today if you could put the order in for that. Thanks.

## 2022-02-14 NOTE — Progress Notes (Signed)
Tsh

## 2022-02-15 ENCOUNTER — Other Ambulatory Visit: Payer: Self-pay | Admitting: Medical

## 2022-02-15 LAB — TSH+FREE T4
Free T4: 1.72 ng/dL (ref 0.82–1.77)
TSH: 0.131 u[IU]/mL — ABNORMAL LOW (ref 0.450–4.500)

## 2022-02-15 MED ORDER — LEVOTHYROXINE SODIUM 112 MCG PO TABS: 112.0000 ug | ORAL_TABLET | Freq: Every day | ORAL | 1 refills | Status: DC

## 2022-02-15 MED ORDER — LEVOTHYROXINE SODIUM 100 MCG PO TABS: 100.0000 ug | ORAL_TABLET | Freq: Every day | ORAL | 2 refills | Status: DC

## 2022-03-04 ENCOUNTER — Other Ambulatory Visit: Payer: Self-pay | Admitting: Pulmonary Disease

## 2022-03-04 DIAGNOSIS — J449 Chronic obstructive pulmonary disease, unspecified: Secondary | ICD-10-CM

## 2022-03-15 ENCOUNTER — Other Ambulatory Visit: Payer: Self-pay | Admitting: Internal Medicine

## 2022-03-31 ENCOUNTER — Telehealth: Payer: Self-pay | Admitting: Medical

## 2022-03-31 NOTE — Telephone Encounter (Signed)
Spoke to patient to schedule Medicare Annual Wellness Visit (AWV) either virtually or in office.   Patient wanted call back in a couple weeks.  She stated she didn't feel good.  I offered to transfer to office patient declined stating she would call later    awvi 09/03/19 per [facility] please schedule with Nurse Health Adviser   45 min for awv-i and in office appointments 30 min for awv-s  phone/virtual appointments

## 2022-04-01 ENCOUNTER — Other Ambulatory Visit (INDEPENDENT_AMBULATORY_CARE_PROVIDER_SITE_OTHER): Payer: Medicare Other

## 2022-04-01 ENCOUNTER — Telehealth (INDEPENDENT_AMBULATORY_CARE_PROVIDER_SITE_OTHER): Payer: Medicare Other | Admitting: Medical

## 2022-04-01 ENCOUNTER — Encounter: Payer: Self-pay | Admitting: Medical

## 2022-04-01 VITALS — Ht 64.0 in | Wt 135.0 lb

## 2022-04-01 DIAGNOSIS — I2510 Atherosclerotic heart disease of native coronary artery without angina pectoris: Secondary | ICD-10-CM | POA: Diagnosis not present

## 2022-04-01 DIAGNOSIS — J988 Other specified respiratory disorders: Secondary | ICD-10-CM | POA: Diagnosis not present

## 2022-04-01 DIAGNOSIS — R059 Cough, unspecified: Secondary | ICD-10-CM | POA: Diagnosis not present

## 2022-04-01 DIAGNOSIS — R051 Acute cough: Secondary | ICD-10-CM

## 2022-04-01 LAB — POCT INFLUENZA A/B
Influenza A, POC: NEGATIVE
Influenza B, POC: NEGATIVE

## 2022-04-01 LAB — POC COVID19 BINAXNOW: SARS Coronavirus 2 Ag: NEGATIVE

## 2022-04-01 MED ORDER — DOXYCYCLINE HYCLATE 100 MG PO TABS: 100.0000 mg | ORAL_TABLET | Freq: Two times a day (BID) | ORAL | 0 refills | Status: DC

## 2022-04-01 MED ORDER — BENZONATATE 200 MG PO CAPS: 200.0000 mg | ORAL_CAPSULE | Freq: Two times a day (BID) | ORAL | 0 refills | Status: DC | PRN

## 2022-04-01 NOTE — Progress Notes (Signed)
Subjective:     Patient ID: Katrina Campbell, female   DOB: [birth year], 66 y.o.   MRN: [id number]  This visit type was conducted due to national recommendations for restrictions regarding the COVID-19 Pandemic (e.g. social distancing) in an effort to limit this patient's exposure and mitigate transmission in our community.  Due to their co-morbid illnesses, this patient is at least at moderate risk for complications without adequate follow up.  This format is felt to be most appropriate for this patient at this time.    Documentation for virtual audio and video telecommunications through [facility] encounter:  The patient was located at home. The provider was located in the office. The patient did consent to this visit and is aware of possible charges through their insurance for this visit.  The other persons participating in this telemedicine service were none. Time spent on call was 20 minutes and in review of previous records 20 minutes total.  This virtual service is not related to other E/M service within previous 7 days.   HPI Chief Complaint  Patient presents with   other    Sick symptoms, last couple of days sneezing, body aches pains, head aches, no fever, cough, ST in the morning, no at home covid test,    Virtual for illness.   She notes few days of lots of runny nose, body aches, headaches, some sore throat, sneezing, quite a bit of cough but dry, no production.   No fever.  Headache is quite bad in morning but a little better as the day goes on.   Husband has been sick with similar.    Uses nebulizer twice daily.    Past Medical History:  Diagnosis Date   Anxiety    COPD (chronic obstructive pulmonary disease) ([place])    Coronary artery disease    Dental staining 07/15/2019   Depression    History of radiation therapy    Right lung- 11/03/20-12/15/20- Dr. 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     albuterol (PROVENTIL) (2.5 MG/3ML) 0.083% nebulizer solution INHALE 3 MLS (2.5 MG TOTAL) VIA NEBULIZER EVERY 6 HOURS AS NEEDED FOR WHEEZING OR SHORTNESS OF BREATH 540 mL 7   albuterol (VENTOLIN HFA) 108 (90 Base) MCG/ACT inhaler Inhale 2 puffs into the lungs every 6 (six) hours as needed for wheezing or shortness of breath.     aspirin EC 81 MG tablet Take 81 mg by mouth daily with lunch.      buPROPion (WELLBUTRIN XL) 300 MG 24 hr tablet TAKE 1 TABLET DAILY 90 tablet 1   Cholecalciferol (DIALYVITE VITAMIN D 5000) 125 MCG (5000 UT) capsule Take 5,000 Units by mouth daily.     esomeprazole (NEXIUM) 40 MG capsule TAKE 1 CAPSULE DAILY 90 capsule 3   Fluticasone-Umeclidin-Vilant (TRELEGY ELLIPTA) 100-62.5-25 MCG/ACT AEPB Inhale 1 puff into the lungs daily. 180 each 3   Guaifenesin (MUCINEX MAXIMUM STRENGTH) 1200 MG TB12 Take 1,200 mg by mouth 2 (two) times daily.     levothyroxine (SYNTHROID) 100 MCG tablet Take 1 tablet (100 mcg total) by mouth daily. 30 tablet 2   levothyroxine (SYNTHROID) 112 MCG tablet Take 1 tablet (112  mcg total) by mouth daily. 30 tablet 1   Multiple Vitamins-Minerals (IMMUNE SUPPORT PO) Take 2 tablets by mouth daily.     Respiratory Therapy Supplies (FLUTTER) DEVI Use as directed 1 each 0   rosuvastatin (CRESTOR) 20 MG tablet Take 1 tablet (20 mg total) by mouth daily. 90 tablet 3   temazepam (RESTORIL) 15 MG capsule Take 1 capsule (15 mg total) by mouth at bedtime as needed. 90 capsule 1   vitamin B-12 (CYANOCOBALAMIN) 1000 MCG tablet Take 1,000 mcg by mouth daily.     Current Facility-Administered Medications on File Prior to Visit  Medication Dose Route Frequency Provider Last Rate Last Admin   Sonafine emulsion 1  application  1 application  Topical Once Gery Pray, MD        Review of Systems As in subjective    Objective:   Physical Exam Due to coronavirus pandemic stay at home measures, patient visit was virtual and they were not examined in person.   Ht 5\' 4"  (1.626 m)   Wt 135 lb (61.2 kg)   BMI 23.17 kg/m  97% oximetry and pulse 89     Assessment:     Encounter Diagnoses  Name Primary?   Acute cough Yes   Respiratory tract infection        Plan:     We discussed symptoms and concerns.  She will come to our back parking lot for COVID and flu testing  Addendum: Flu and COVID test negative today.  I went out listened to her lungs in the back parking lot, decreased in general but no obvious wheezing or dullness  Advised rest, hydration, discussed that the symptoms may just represent a viral cold.  We also discussed that COVID and flu testing are 100% accurate but I suspect more of a cold  Continue supportive measures, can use Tessalon Perles as needed.  If over the weekend she develops consistent colored mucus, fever, rattly wet cough or worse overall then begin doxycycline given her underlying COPD.  If much worse over the weekend, then go to the emergency department   Conya was seen today for other.  Diagnoses and all orders for this visit:  Acute cough  Respiratory tract infection  Other orders -     benzonatate (TESSALON) 200 MG capsule; Take 1 capsule (200 mg total) by mouth 2 (two) times daily as needed for cough. -     doxycycline (VIBRA-TABS) 100 MG tablet; Take 1 tablet (100 mg total) by mouth 2 (two) times daily.  F/u prn

## 2022-04-15 ENCOUNTER — Encounter (HOSPITAL_COMMUNITY): Payer: Self-pay

## 2022-04-15 ENCOUNTER — Inpatient Hospital Stay: Payer: Medicare Other | Attending: Hematology and Oncology

## 2022-04-15 ENCOUNTER — Other Ambulatory Visit: Payer: Self-pay

## 2022-04-15 ENCOUNTER — Ambulatory Visit (HOSPITAL_COMMUNITY)
Admission: RE | Admit: 2022-04-15 | Discharge: 2022-04-15 | Disposition: A | Discharge status: Home / Self Care | Payer: Medicare Other | Source: Ambulatory Visit | Attending: Internal Medicine | Admitting: Internal Medicine

## 2022-04-15 DIAGNOSIS — C3490 Malignant neoplasm of unspecified part of unspecified bronchus or lung: Secondary | ICD-10-CM | POA: Insufficient documentation

## 2022-04-15 DIAGNOSIS — Z85118 Personal history of other malignant neoplasm of bronchus and lung: Secondary | ICD-10-CM | POA: Insufficient documentation

## 2022-04-15 DIAGNOSIS — Z9221 Personal history of antineoplastic chemotherapy: Secondary | ICD-10-CM | POA: Insufficient documentation

## 2022-04-15 DIAGNOSIS — D649 Anemia, unspecified: Secondary | ICD-10-CM | POA: Insufficient documentation

## 2022-04-15 DIAGNOSIS — E039 Hypothyroidism, unspecified: Secondary | ICD-10-CM | POA: Insufficient documentation

## 2022-04-15 DIAGNOSIS — C3491 Malignant neoplasm of unspecified part of right bronchus or lung: Secondary | ICD-10-CM

## 2022-04-15 LAB — CMP (CANCER CENTER ONLY)
ALT: 21 U/L (ref 0–44)
AST: 21 U/L (ref 15–41)
Albumin: 4 g/dL (ref 3.5–5.0)
Alkaline Phosphatase: 86 U/L (ref 38–126)
Anion gap: 5 (ref 5–15)
BUN: 12 mg/dL (ref 8–23)
CO2: 27 mmol/L (ref 22–32)
Calcium: 9.6 mg/dL (ref 8.9–10.3)
Chloride: 108 mmol/L (ref 98–111)
Creatinine: 0.72 mg/dL (ref 0.44–1.00)
GFR, Estimated: >60 mL/min (ref >60)
Glucose, Bld: 82 mg/dL (ref 70–99)
Potassium: 4.4 mmol/L (ref 3.5–5.1)
Sodium: 140 mmol/L (ref 135–145)
Total Bilirubin: 0.5 mg/dL (ref 0.3–1.2)
Total Protein: 6.8 g/dL (ref 6.5–8.1)

## 2022-04-15 LAB — CBC WITH DIFFERENTIAL (CANCER CENTER ONLY)
Abs Immature Granulocytes: 0.03 10*3/uL (ref 0.00–0.07)
Basophils Absolute: 0 10*3/uL (ref 0.0–0.1)
Basophils Relative: 0 %
Eosinophils Absolute: 0.1 10*3/uL (ref 0.0–0.5)
Eosinophils Relative: 1 %
HCT: 37.7 % (ref 36.0–46.0)
Hemoglobin: 13 g/dL (ref 12.0–15.0)
Immature Granulocytes: 0 %
Lymphocytes Relative: 21 %
Lymphs Abs: 1.4 10*3/uL (ref 0.7–4.0)
MCH: 29.1 pg (ref 26.0–34.0)
MCHC: 34.5 g/dL (ref 30.0–36.0)
MCV: 84.5 fL (ref 80.0–100.0)
Monocytes Absolute: 0.4 10*3/uL (ref 0.1–1.0)
Monocytes Relative: 6 %
Neutro Abs: 4.9 10*3/uL (ref 1.7–7.7)
Neutrophils Relative %: 72 %
Platelet Count: 156 10*3/uL (ref 150–400)
RBC: 4.46 MIL/uL (ref 3.87–5.11)
RDW: 14.2 % (ref 11.5–15.5)
WBC Count: 6.9 10*3/uL (ref 4.0–10.5)
nRBC: 0 % (ref 0.0–0.2)

## 2022-04-15 MED ORDER — IOHEXOL 300 MG/ML  SOLN
75.0000 mL | Freq: Once | INTRAMUSCULAR | Status: AC | PRN
  Administered 2022-04-15: 75 mL via INTRAVENOUS

## 2022-04-15 MED ORDER — SODIUM CHLORIDE (PF) 0.9 % IJ SOLN
INTRAMUSCULAR | Status: AC
  Filled 2022-04-15: qty 50

## 2022-04-18 ENCOUNTER — Inpatient Hospital Stay (HOSPITAL_BASED_OUTPATIENT_CLINIC_OR_DEPARTMENT_OTHER): Payer: Medicare Other | Admitting: Internal Medicine

## 2022-04-18 ENCOUNTER — Other Ambulatory Visit: Payer: Self-pay

## 2022-04-18 VITALS — BP 164/88 | HR 93 | Temp 98.5°F | Resp 18 | Wt 133.2 lb

## 2022-04-18 DIAGNOSIS — C3490 Malignant neoplasm of unspecified part of unspecified bronchus or lung: Secondary | ICD-10-CM

## 2022-04-18 DIAGNOSIS — E039 Hypothyroidism, unspecified: Secondary | ICD-10-CM | POA: Diagnosis not present

## 2022-04-18 DIAGNOSIS — Z85118 Personal history of other malignant neoplasm of bronchus and lung: Secondary | ICD-10-CM | POA: Diagnosis present

## 2022-04-18 DIAGNOSIS — Z9221 Personal history of antineoplastic chemotherapy: Secondary | ICD-10-CM | POA: Diagnosis not present

## 2022-04-18 DIAGNOSIS — D649 Anemia, unspecified: Secondary | ICD-10-CM | POA: Diagnosis not present

## 2022-04-18 NOTE — Progress Notes (Signed)
[facility] [phone]   [phone]  OFFICE PROGRESS NOTE  Jac Canavan, PA-C [address] [place] [address]  DIAGNOSIS: Stage IIIA ([place], N2, M0) non-small cell lung cancer, squamous cell carcinoma presented with subcarinal mass/lymphadenopathy diagnosed in July 2022.   PRIOR THERAPY:  1) Weekly carboplatin for AUC of 2 and paclitaxel 45 mg/m2.  First dose expected on 11/02/2020.  Status post 7 cycles.  Last dose was given 12/14/2020. 2) Consolidation treatment with immunotherapy with Imfinzi 1500 Mg IV every 4 weeks.  First dose 01/14/2021.  Status post 13 cycles   CURRENT THERAPY: Observation.  INTERVAL HISTORY: Katrina Campbell 67 y.o. female returns to the clinic today for follow-up visit.  The patient is feeling fine today with no concerning complaints.  She denied having any current chest pain but has occasional shortness of breath with exertion with no cough or hemoptysis.  She has no nausea, vomiting, diarrhea or constipation.  She has no headache or visual changes.  She denied having any fever or chills.  The patient had repeat CT scan of the chest performed recently and she is here for evaluation and discussion of her scan results.  MEDICAL HISTORY: Past Medical History:  Diagnosis Date   Anxiety    COPD (chronic obstructive pulmonary disease) ([facility])    Coronary artery disease    Dental staining 07/15/2019   Depression    History of radiation therapy    Right lung- 11/03/20-12/15/20- Dr. Antony Blackbird   Hyperlipidemia    Labile hypertension    Myocardial infarction [facility]) 2018   Pneumonia    Pulmonary mycobacterial infection ([facility]) 10/24/2018    ALLERGIES:  is allergic to breztri aerosphere [budeson-glycopyrrol-formoterol], benadryl [diphenhydramine], codeine, prednisone, and statins.  MEDICATIONS:  Current Outpatient Medications  Medication Sig Dispense Refill   acetaminophen (TYLENOL) 500 MG tablet Take 500 mg by mouth  every 6 (six) hours as needed for moderate pain or headache.     albuterol (PROVENTIL) (2.5 MG/3ML) 0.083% nebulizer solution INHALE 3 MLS (2.5 MG TOTAL) VIA NEBULIZER EVERY 6 HOURS AS NEEDED FOR WHEEZING OR SHORTNESS OF BREATH 540 mL 7   albuterol (VENTOLIN HFA) 108 (90 Base) MCG/ACT inhaler Inhale 2 puffs into the lungs every 6 (six) hours as needed for wheezing or shortness of breath.     aspirin EC 81 MG tablet Take 81 mg by mouth daily with lunch.      benzonatate (TESSALON) 200 MG capsule Take 1 capsule (200 mg total) by mouth 2 (two) times daily as needed for cough. 30 capsule 0   buPROPion (WELLBUTRIN XL) 300 MG 24 hr tablet TAKE 1 TABLET DAILY 90 tablet 1   Cholecalciferol (DIALYVITE VITAMIN D 5000) 125 MCG (5000 UT) capsule Take 5,000 Units by mouth daily.     doxycycline (VIBRA-TABS) 100 MG tablet Take 1 tablet (100 mg total) by mouth 2 (two) times daily. 14 tablet 0   esomeprazole (NEXIUM) 40 MG capsule TAKE 1 CAPSULE DAILY 90 capsule 3   Fluticasone-Umeclidin-Vilant (TRELEGY ELLIPTA) 100-62.5-25 MCG/ACT AEPB Inhale 1 puff into the lungs daily. 180 each 3   Guaifenesin (MUCINEX MAXIMUM STRENGTH) 1200 MG TB12 Take 1,200 mg by mouth 2 (two) times daily.     levothyroxine (SYNTHROID) 100 MCG tablet Take 1 tablet (100 mcg total) by mouth daily. 30 tablet 2   levothyroxine (SYNTHROID) 112 MCG tablet Take 1 tablet (112 mcg total) by mouth daily. 30 tablet 1   Multiple Vitamins-Minerals (IMMUNE SUPPORT  PO) Take 2 tablets by mouth daily.     Respiratory Therapy Supplies (FLUTTER) DEVI Use as directed 1 each 0   rosuvastatin (CRESTOR) 20 MG tablet Take 1 tablet (20 mg total) by mouth daily. 90 tablet 3   temazepam (RESTORIL) 15 MG capsule Take 1 capsule (15 mg total) by mouth at bedtime as needed. 90 capsule 1   vitamin B-12 (CYANOCOBALAMIN) 1000 MCG tablet Take 1,000 mcg by mouth daily.     No current facility-administered medications for this visit.   Facility-Administered Medications  Ordered in Other Visits  Medication Dose Route Frequency Provider Last Rate Last Admin   Sonafine emulsion 1 application  1 application  Topical Once Antony Blackbird, MD        SURGICAL HISTORY:  Past Surgical History:  Procedure Laterality Date   BALLOON DILATION N/A 04/29/2021   Procedure: BALLOON DILATION;  Surgeon: Rachael Fee, MD;  Location: [facility];  Service: Endoscopy;  Laterality: N/A;   BIOPSY  01/14/2021   Procedure: BIOPSY;  Surgeon: Josephine Igo, DO;  Location: [facility];  Service: Pulmonary;;   BRONCHIAL BIOPSY  10/08/2020   Procedure: BRONCHIAL BIOPSIES;  Surgeon: Josephine Igo, DO;  Location: [facility];  Service: Pulmonary;;   BRONCHIAL BRUSHINGS  10/08/2020   Procedure: BRONCHIAL BRUSHINGS;  Surgeon: Josephine Igo, DO;  Location: [facility];  Service: Pulmonary;;   BRONCHIAL BRUSHINGS  01/14/2021   Procedure: BRONCHIAL BRUSHINGS;  Surgeon: Josephine Igo, DO;  Location: [facility];  Service: Pulmonary;;   BRONCHIAL WASHINGS  10/08/2020   Procedure: BRONCHIAL WASHINGS;  Surgeon: Josephine Igo, DO;  Location: [facility];  Service: Pulmonary;;   BRONCHIAL WASHINGS  01/14/2021   Procedure: BRONCHIAL WASHINGS;  Surgeon: Josephine Igo, DO;  Location: [facility];  Service: Pulmonary;;   ESOPHAGOGASTRODUODENOSCOPY (EGD) WITH PROPOFOL N/A 04/29/2021   Procedure: ESOPHAGOGASTRODUODENOSCOPY (EGD) WITH PROPOFOL;  Surgeon: Rachael Fee, MD;  Location: [facility];  Service: Endoscopy;  Laterality: N/A;   FINE NEEDLE ASPIRATION  10/08/2020   Procedure: FINE NEEDLE ASPIRATION (FNA) LINEAR;  Surgeon: Josephine Igo, DO;  Location: [facility];  Service: Pulmonary;;   LEFT HEART CATH AND CORONARY ANGIOGRAPHY N/A 11/07/2017   Procedure: LEFT HEART CATH AND CORONARY ANGIOGRAPHY;  Surgeon: Yvonne Kendall, MD;  Location: [facility];  Service: Cardiovascular;  Laterality: N/A;   NASAL SINUS SURGERY  1986   VIDEO BRONCHOSCOPY Bilateral 01/29/2019    Procedure: VIDEO BRONCHOSCOPY WITH FLUORO;  Surgeon: Chilton Greathouse, MD;  Location: [facility];  Service: Cardiopulmonary;  Laterality: Bilateral;   VIDEO BRONCHOSCOPY Right 01/14/2021   Procedure: VIDEO BRONCHOSCOPY WITHOUT FLUORO;  Surgeon: Josephine Igo, DO;  Location: [facility];  Service: Pulmonary;  Laterality: Right;  possible cryotherapy   VIDEO BRONCHOSCOPY WITH ENDOBRONCHIAL ULTRASOUND N/A 10/08/2020   Procedure: VIDEO BRONCHOSCOPY WITH ENDOBRONCHIAL ULTRASOUND;  Surgeon: Josephine Igo, DO;  Location: [facility];  Service: Pulmonary;  Laterality: N/A;    REVIEW OF SYSTEMS:  A comprehensive review of systems was negative except for: Respiratory: positive for dyspnea on exertion   PHYSICAL EXAMINATION: General appearance: alert, cooperative, and no distress Head: Normocephalic, without obvious abnormality, atraumatic Neck: no adenopathy, no JVD, supple, symmetrical, trachea midline, and thyroid not enlarged, symmetric, no tenderness/mass/nodules Lymph nodes: Cervical, supraclavicular, and axillary nodes normal. Resp: clear to auscultation bilaterally Back: symmetric, no curvature. ROM normal. No CVA tenderness. Cardio: regular rate and rhythm, S1, S2 normal, no murmur, click, rub or gallop GI: soft, non-tender; bowel sounds  normal; no masses,  no organomegaly Extremities: extremities normal, atraumatic, no cyanosis or edema  ECOG PERFORMANCE STATUS: 1 - Symptomatic but completely ambulatory  Blood pressure (!) 164/88, pulse 93, temperature 98.5 F (36.9 C), temperature source Oral, resp. rate 18, weight 133 lb 3 oz (60.4 kg), SpO2 96 %.  LABORATORY DATA: Lab Results  Component Value Date   WBC 6.9 04/15/2022   HGB 13.0 04/15/2022   HCT 37.7 04/15/2022   MCV 84.5 04/15/2022   PLT 156 04/15/2022      Chemistry      Component Value Date/Time   NA 140 04/15/2022 1015   NA 144 02/10/2020 0813   K 4.4 04/15/2022 1015   CL 108 04/15/2022 1015   CO2 27 04/15/2022  1015   BUN 12 04/15/2022 1015   BUN 10 02/10/2020 0813   CREATININE 0.72 04/15/2022 1015   CREATININE 0.96 07/22/2021 0936      Component Value Date/Time   CALCIUM 9.6 04/15/2022 1015   ALKPHOS 86 04/15/2022 1015   AST 21 04/15/2022 1015   ALT 21 04/15/2022 1015   BILITOT 0.5 04/15/2022 1015       RADIOGRAPHIC STUDIES: CT Chest W Contrast  Result Date: 04/15/2022 CLINICAL DATA:  Staging non-small-cell lung cancer. * Tracking Code: BO * EXAM: CT CHEST WITH CONTRAST TECHNIQUE: Multidetector CT imaging of the chest was performed during intravenous contrast administration. RADIATION DOSE REDUCTION: This exam was performed according to the departmental dose-optimization program which includes automated exposure control, adjustment of the mA and/or kV according to patient size and/or use of iterative reconstruction technique. CONTRAST:  27mL OMNIPAQUE IOHEXOL 300 MG/ML  SOLN COMPARISON:  CT 01/14/2022 and older FINDINGS: Cardiovascular: The heart is nonenlarged. Small pericardial effusion is stable. Coronary artery calcifications are seen. Scattered atherosclerotic changes along the thoracic aorta. The ascending aorta is minimally ectatic. Diameter approaches 4.0 cm, unchanged from prior. Mediastinum/Nodes: No specific abnormal lymph node enlargement identified in the axillary regions, hilum or mediastinum. There is soft tissue thickening and edema along the mediastinum extending towards the right hilum. There is also wall thickening along the midthoracic esophagus which is increased slightly from previous. Please correlate with specific symptoms. Lungs/Pleura: Centrilobular emphysematous changes are identified. There is some linear opacity at the bases likely scar or atelectasis. Similar changes along the middle lobe. Extensive volume loss with opacity and atelectasis along the medial right upper lobe with some occlusion of bronchi and some distortion. Appearance of this is similar to the previous  examination. Upper Abdomen: Along the upper abdomen the adrenal glands are preserved. Mild fatty liver infiltration. There is a small low-attenuation lesion in the dome of the liver. This is best seen on coronal series 6, image 73 measuring a proximally 11 mm. This is of uncertain etiology and significance. Recommend further workup when appropriate with dedicated liver imaging. Musculoskeletal: Scattered degenerative changes along the spine. If there is concern of osseous metastatic disease, bone scan can be performed as clinically indicated. IMPRESSION: Stable presumed posttreatment changes involving the right upper lobe near the hilum with volume loss opacity in opacification of bronchi with distortion. No new mass lesion, fluid collection or lymph node enlargement. Significant stranding in the mediastinum with thickening of the esophagus. Please correlate with any symptoms. Small pericardial effusion. Fatty liver infiltration. Subtle low-density lesion in the dome of the liver of uncertain etiology and significance. Recommend dedicated liver imaging when appropriate to exclude an aggressive process Aortic Atherosclerosis (ICD10-I70.0) and Emphysema (ICD10-J43.9). Electronically Signed   By:  Karen Kays M.D.   On: 04/15/2022 15:33    ASSESSMENT AND PLAN: This is a very pleasant 67 years old white female recently diagnosed with a stage IIIA ([place], N2, M0) non-small cell lung cancer, squamous cell carcinoma presented with subcarinal mass/lymphadenopathy diagnosed in July 2022. The patient underwent a course of concurrent chemoradiation with weekly carboplatin for AUC of 2 and paclitaxel 45 Mg/M2 status post 7 cycles. The patient tolerated her treatment well except for fatigue and sore throat. The patient completed a course of consolidation treatment with immunotherapy with Imfinzi 1500 Mg IV every 4 weeks status post 13 cycles.   She tolerated her previous treatment fairly well with no concerning adverse  effects. The patient is currently on observation with no concerning complaints. She had repeat CT scan of the chest performed recently.  I personally and independently reviewed the scan and discussed the results with the patient today. Her scan showed no concerning findings for disease recurrence or metastasis. I recommended for her to continue on observation with repeat CT scan of the chest in 3 months. For the anemia, she will continue with the oral iron tablets with vitamin C. For the hypothyroidism she is currently on levothyroxine 125 mcg p.o. daily. She was advised to call immediately if she has any other concerning symptoms in the interval. The patient voices understanding of current disease status and treatment options and is in agreement with the current care plan.  All questions were answered. The patient knows to call the clinic with any problems, questions or concerns. We can certainly see the patient much sooner if necessary.  Disclaimer: This note was dictated with voice recognition software. Similar sounding words can inadvertently be transcribed and may not be corrected upon review.

## 2022-04-22 ENCOUNTER — Telehealth: Payer: Self-pay | Admitting: Internal Medicine

## 2022-04-22 DIAGNOSIS — R7989 Other specified abnormal findings of blood chemistry: Secondary | ICD-10-CM

## 2022-04-22 NOTE — Telephone Encounter (Signed)
Put in future orders  

## 2022-04-22 NOTE — Telephone Encounter (Signed)
Pt is wanting to come in to get her thyroid labs done on Tuesday. Wanted to find out if you wanted TSH and FreeT4 for labs

## 2022-04-26 ENCOUNTER — Other Ambulatory Visit: Payer: Medicare Other

## 2022-04-26 DIAGNOSIS — R7989 Other specified abnormal findings of blood chemistry: Secondary | ICD-10-CM

## 2022-04-27 ENCOUNTER — Other Ambulatory Visit: Payer: Self-pay | Admitting: Medical

## 2022-04-27 DIAGNOSIS — E039 Hypothyroidism, unspecified: Secondary | ICD-10-CM

## 2022-04-27 LAB — TSH: TSH: 0.125 u[IU]/mL — ABNORMAL LOW (ref 0.450–4.500)

## 2022-04-27 MED ORDER — LEVOTHYROXINE SODIUM 88 MCG PO TABS: 88.0000 ug | ORAL_TABLET | Freq: Every day | ORAL | 1 refills | Status: DC

## 2022-04-27 NOTE — Progress Notes (Signed)
Results sent through MyChart

## 2022-04-28 ENCOUNTER — Other Ambulatory Visit: Payer: Self-pay | Admitting: Pulmonary Disease

## 2022-04-28 ENCOUNTER — Telehealth: Payer: Self-pay | Admitting: Medical

## 2022-04-28 NOTE — Telephone Encounter (Signed)
Left message for patient to call back and schedule Medicare Annual Wellness Visit (AWV) either virtually or in office. Left  my Herbie Drape number [phone]   awvi 09/03/19 per [facility] please schedule with Nurse Health Adviser   45 min for awv-i  in office appointments 30 min for awv-s & awv-i phone/virtual appointments

## 2022-04-29 ENCOUNTER — Other Ambulatory Visit (HOSPITAL_COMMUNITY): Payer: Self-pay

## 2022-04-29 ENCOUNTER — Telehealth: Payer: Self-pay | Admitting: Pulmonary Disease

## 2022-04-29 MED ORDER — TRELEGY ELLIPTA 100-62.5-25 MCG/ACT IN AEPB: INHALATION_SPRAY | RESPIRATORY_TRACT | 3 refills | Status: DC

## 2022-04-29 NOTE — Telephone Encounter (Signed)
Spoke to pt and informed pt I sent in 90 day supply for Trelegy 100. Pt verbalized understanding. Nothing further needed.

## 2022-04-29 NOTE — Telephone Encounter (Signed)
We called in a 60 day supply of Trelegy  to [facility]. She is asking for a 90 days supply. It is cheaper for her.  Pls call PT to advise. [phone]

## 2022-05-05 ENCOUNTER — Ambulatory Visit (INDEPENDENT_AMBULATORY_CARE_PROVIDER_SITE_OTHER): Payer: Medicare Other | Admitting: Pulmonary Disease

## 2022-05-05 ENCOUNTER — Encounter: Payer: Self-pay | Admitting: Pulmonary Disease

## 2022-05-05 VITALS — BP 140/90 | HR 88 | Ht 64.0 in | Wt 134.0 lb

## 2022-05-05 DIAGNOSIS — J449 Chronic obstructive pulmonary disease, unspecified: Secondary | ICD-10-CM

## 2022-05-05 DIAGNOSIS — A310 Pulmonary mycobacterial infection: Secondary | ICD-10-CM

## 2022-05-05 DIAGNOSIS — C3491 Malignant neoplasm of unspecified part of right bronchus or lung: Secondary | ICD-10-CM | POA: Diagnosis not present

## 2022-05-05 NOTE — Progress Notes (Signed)
Synopsis: Referred in January 2021 for former smoker quit 2014 moderate COPD, MAI by Carlena Hurl, PA-C  Subjective:   PATIENT ID: Katrina Campbell GENDER: female DOB: [birth year], MRN: [id number]  Chief Complaint  Patient presents with   Follow-up    F/up    This is a 67 year old female moderate COPD, former smoker quit 2014, MAI patient last seen in the office by Dr. Vaughan Browner.  Former Dr. Lenna Gilford patient.  Treated for stenotrophomonas in the past.  Prior 3 sputum's AFB, 1/3+ for MAI.  Saw infectious disease to discuss initiation of therapy.  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  Patient was last seen by infectious disease on 04/15/2019.  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  Just started on azithromycin 500 mg daily, rifampin 600 mg daily plus ethambutol 900 mg daily.  Patient does have CT imaging with lower lobe bronchiectasis.  Patient has chronic sputum production.  She is short of breath with daily cough and sputum production.  During this time she is very anxious and hesitant about going out in public due to her chronic cough.  She denies hemoptysis patient's weight has also been stable.  OV 11/06/2019: around the 4th of July patient had congestion and cough that still hasnt really gone away.  Overall doing much better now.  Has less cough and congestion.  Recent follow-up with infectious disease plan for Monday.  Been compliant with her medications.  Still has some daily sputum production.  Rarely has a streak of blood with cough.  OV 04/14/2020: Here today for follow-up.  Establish care with me back last year.  Was already under treatment for her MAI.  Followed up with infectious disease here in [place] as well as met with ID at [facility].  Decision was made to come off of treatments.  Recent sputum's have been negative.  She does have  significant emphysema currently compliant with inhaler regimen.  From a respiratory standpoint she is doing fine.  No significant symptom change after stopping antibiotics.   OV 10/01/2020: Here today for follow-up had abnormal lung cancer screening CT follow-up with an enlarged mediastinal node.  Patient was sent for nuclear medicine PET scan.  PET scan revealed a significantly PET avid station 7 subcarinal node with SUV of 11.  We reviewed this today in the office.  Concerning for underlying malignancy.  OV 10/09/2020: Here today for follow-up.  Recent CT scan of the chest had enlarged mediastinal node.  PET scan with PET avid station 7 and irregular lining of the right mainstem.  Patient was taken for video bronchoscopy on 10/08/2020.  Bronchoscopy revealed enlarged subcarinal adenopathy which was sampled under ultrasound guidance.  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  Endobronchial brushings, forcep biopsies of the abnormal mucosa and visible tumor were taken.  OV 02/10/2021: having difficulty breathing, shes is more short of breath.  She was taken for bronchoscopy recently with repeat biopsy which did show persistence of her malignancy.  Her culture results also were positive again for MAI.  I did reach out to infectious disease today for her symptoms should consider treatment options again.  Especially in lieu of of her immune suppression receiving intermittent therapy.  OV 03/03/2021: having lots of difficulty sob. Also she feels like food sometimes gets stuck with swallowing.  Overall  feels anxious that her shortness of breath is not getting much better.  Saw infectious disease in, and once again and agreed to continue to hold therapy for MAI.  I agree she does not have a lot of radiographic evidence of MAI.  She still has significant amount of sputum production.  She has not been using her vest therapy too much  because it worsens back pain.  Trying to use her flutter valve.  OV 04/23/2021: Here today for follow up.  Recently had a chest x-ray with right upper lobe collapse persistent.  She ultimately end up going to the ER having an episode of feeling choked and ultimately what sounds like she passed a mucous plug.  Repeat imaging shows reinflation of the right upper lobe.  She feels much better after all this happened.  Started immunotherapy and has been tolerating that well with medical oncology.  Encouraged her to continue to use her breathing treatments and flutter valve to help with airway clearance techniques.  OV 08/02/2021: Follow-up for COPD and recurrent right upper lobe atelectasis.  She had a recent CT completed by infectious disease that shows persistent atelectasis in the right upper lobe.  It is consistent with her previous x-rays.  She still doing her nebulized treatments and vest therapy twice daily with as needed flutter valve.  She has very little cough and sputum production.  Not been very successful in producing sputum for cultures.  She follows up with infectious disease in a few weeks.  Not on any treatments at this time for MAI.  OV 05/05/2022: Here today for COPD follow-up, chronic right upper lobe atelectasis, known MAI.  She is on treatment for short period ultimately stopped.  She is doing airway clearance techniques vest therapy flutter valve.  She does have daily sputum production.  She is managed with Trelegy for her COPD and as needed albuterol.  From respiratory standpoint she has been doing very well with no recent exacerbations.  She had a cancer CT follow-up in January 2024 which reveals no evidence of recurrence of disease.     Past Medical History:  Diagnosis Date   Anxiety    COPD (chronic obstructive pulmonary disease) ([place])    Coronary artery disease    Dental staining 07/15/2019   Depression    History of radiation therapy    Right lung- 11/03/20-12/15/20- Dr. Gery Pray   Hyperlipidemia    Labile hypertension    Myocardial infarction [facility]) 2018   Pneumonia    Pulmonary mycobacterial infection ([place]) 10/24/2018     Family History  Problem Relation Age of Onset   Heart attack Mother    Emphysema Mother    Congestive Heart Failure Mother    Heart attack Father    Congestive Heart Failure Father    Asthma Other    Lung disease Sister    Lung disease Brother    Colon cancer Neg Hx      Past Surgical History:  Procedure Laterality Date   BALLOON DILATION N/A 04/29/2021   Procedure: BALLOON DILATION;  Surgeon: Milus Banister, MD;  Location: [facility];  Service: Endoscopy;  Laterality: N/A;   BIOPSY  01/14/2021   Procedure: BIOPSY;  Surgeon: Garner Nash, DO;  Location: [place] [facility];  Service: Pulmonary;;   BRONCHIAL BIOPSY  10/08/2020   Procedure: BRONCHIAL BIOPSIES;  Surgeon: Garner Nash, DO;  Location: [place] [facility];  Service: Pulmonary;;   BRONCHIAL BRUSHINGS  10/08/2020   Procedure: BRONCHIAL BRUSHINGS;  Surgeon: Valeta Harms,  Octavio Graves, DO;  Location: [place] [facility];  Service: Pulmonary;;   BRONCHIAL BRUSHINGS  01/14/2021   Procedure: BRONCHIAL BRUSHINGS;  Surgeon: Garner Nash, DO;  Location: [place];  Service: Pulmonary;;   BRONCHIAL WASHINGS  10/08/2020   Procedure: BRONCHIAL WASHINGS;  Surgeon: Garner Nash, DO;  Location: [facility];  Service: Pulmonary;;   BRONCHIAL WASHINGS  01/14/2021   Procedure: BRONCHIAL WASHINGS;  Surgeon: Garner Nash, DO;  Location: [facility];  Service: Pulmonary;;   ESOPHAGOGASTRODUODENOSCOPY (EGD) WITH PROPOFOL N/A 04/29/2021   Procedure: ESOPHAGOGASTRODUODENOSCOPY (EGD) WITH PROPOFOL;  Surgeon: Milus Banister, MD;  Location: [facility];  Service: Endoscopy;  Laterality: N/A;   FINE NEEDLE ASPIRATION  10/08/2020   Procedure: FINE NEEDLE ASPIRATION (FNA) LINEAR;  Surgeon: Garner Nash, DO;  Location: [facility];  Service: Pulmonary;;   LEFT HEART CATH AND CORONARY ANGIOGRAPHY N/A  11/07/2017   Procedure: LEFT HEART CATH AND CORONARY ANGIOGRAPHY;  Surgeon: Nelva Bush, MD;  Location: [facility];  Service: Cardiovascular;  Laterality: N/A;   NASAL SINUS SURGERY  1986   VIDEO BRONCHOSCOPY Bilateral 01/29/2019   Procedure: VIDEO BRONCHOSCOPY WITH FLUORO;  Surgeon: Marshell Garfinkel, MD;  Location: [place];  Service: Cardiopulmonary;  Laterality: Bilateral;   VIDEO BRONCHOSCOPY Right 01/14/2021   Procedure: VIDEO BRONCHOSCOPY WITHOUT FLUORO;  Surgeon: Garner Nash, DO;  Location: [place] [facility];  Service: Pulmonary;  Laterality: Right;  possible cryotherapy   VIDEO BRONCHOSCOPY WITH ENDOBRONCHIAL ULTRASOUND N/A 10/08/2020   Procedure: VIDEO BRONCHOSCOPY WITH ENDOBRONCHIAL ULTRASOUND;  Surgeon: Garner Nash, DO;  Location: [facility];  Service: Pulmonary;  Laterality: N/A;    Social History   Socioeconomic History   Marital status: Married    Spouse name: Not on file   Number of children: Not on file   Years of education: Not on file   Highest education level: Not on file  Occupational History   Not on file  Tobacco Use   Smoking status: Former    Packs/day: 1.75    Years: 44.00    Total pack years: 77.00    Types: Cigarettes    Quit date: 11/09/2012    Years since quitting: 9.4   Smokeless tobacco: Never   Tobacco comments:    vapor  Vaping Use   Vaping Use: Former  Substance and Sexual Activity   Alcohol use: Not Currently    Alcohol/week: 24.0 standard drinks of alcohol    Types: 24 Cans of beer per week   Drug use: Not Currently   Sexual activity: Not Currently  Other Topics Concern   Not on file  Social History Narrative   Not on file   Social Determinants of Health   Financial Resource Strain: Medium Risk (10/29/2020)   Overall Financial Resource Strain (CARDIA)    Difficulty of Paying Living Expenses: Somewhat hard  Food Insecurity: No Food Insecurity (10/29/2020)   Hunger Vital Sign    Worried About Running Out of Food in  the Last Year: Never true    Ran Out of Food in the Last Year: Never true  Transportation Needs: No Transportation Needs (10/29/2020)   PRAPARE - Hydrologist (Medical): No    Lack of Transportation (Non-Medical): No  Physical Activity: Not on file  Stress: Stress Concern Present (10/29/2020)   [place]    Feeling of Stress : Rather much  Social Connections: Unknown (10/29/2020)   Social Connection and Isolation Panel [  NHANES]    Frequency of Communication with Friends and Family: More than three times a week    Frequency of Social Gatherings with Friends and Family: More than three times a week    Attends Religious Services: More than 4 times per year    Active Member of [employer] or Organizations: Not on file    Attends Archivist Meetings: Not on file    Marital Status: Married  Human resources officer Violence: Not on file     Allergies  Allergen Reactions   Data processing manager [Budeson-Glycopyrrol-Formoterol] Shortness Of Breath   Benadryl [Diphenhydramine] Nausea Only   Codeine Other (See Comments)    Causes pt to blackout   Prednisone Other (See Comments)    Passed out   Statins     Myalgias at higher doses      Outpatient Medications Prior to Visit  Medication Sig Dispense Refill   albuterol (PROVENTIL) (2.5 MG/3ML) 0.083% nebulizer solution INHALE 3 MLS (2.5 MG TOTAL) VIA NEBULIZER EVERY 6 HOURS AS NEEDED FOR WHEEZING OR SHORTNESS OF BREATH 540 mL 7   albuterol (VENTOLIN HFA) 108 (90 Base) MCG/ACT inhaler Inhale 2 puffs into the lungs every 6 (six) hours as needed for wheezing or shortness of breath.     buPROPion (WELLBUTRIN XL) 300 MG 24 hr tablet TAKE 1 TABLET DAILY 90 tablet 1   Fluticasone-Umeclidin-Vilant (TRELEGY ELLIPTA) 100-62.5-25 MCG/ACT AEPB USE 1 INHALATION DAILY 180 each 3   Guaifenesin (MUCINEX MAXIMUM STRENGTH) 1200 MG TB12 Take 1,200 mg by mouth 2 (two) times  daily.     Respiratory Therapy Supplies (FLUTTER) DEVI Use as directed 1 each 0   acetaminophen (TYLENOL) 500 MG tablet Take 500 mg by mouth every 6 (six) hours as needed for moderate pain or headache.     aspirin EC 81 MG tablet Take 81 mg by mouth daily with lunch.      benzonatate (TESSALON) 200 MG capsule Take 1 capsule (200 mg total) by mouth 2 (two) times daily as needed for cough. (Patient not taking: Reported on 05/05/2022) 30 capsule 0   Cholecalciferol (DIALYVITE VITAMIN D 5000) 125 MCG (5000 UT) capsule Take 5,000 Units by mouth daily.     doxycycline (VIBRA-TABS) 100 MG tablet Take 1 tablet (100 mg total) by mouth 2 (two) times daily. 14 tablet 0   esomeprazole (NEXIUM) 40 MG capsule TAKE 1 CAPSULE DAILY 90 capsule 3   levothyroxine (SYNTHROID) 88 MCG tablet Take 1 tablet (88 mcg total) by mouth daily. 30 tablet 1   Multiple Vitamins-Minerals (IMMUNE SUPPORT PO) Take 2 tablets by mouth daily.     rosuvastatin (CRESTOR) 20 MG tablet Take 1 tablet (20 mg total) by mouth daily. 90 tablet 3   temazepam (RESTORIL) 15 MG capsule Take 1 capsule (15 mg total) by mouth at bedtime as needed. 90 capsule 1   vitamin B-12 (CYANOCOBALAMIN) 1000 MCG tablet Take 1,000 mcg by mouth daily.     Facility-Administered Medications Prior to Visit  Medication Dose Route Frequency Provider Last Rate Last Admin   Sonafine emulsion 1 application  1 application  Topical Once Gery Pray, MD        Review of Systems  Constitutional:  Negative for chills, fever, malaise/fatigue and weight loss.  HENT:  Negative for hearing loss, sore throat and tinnitus.   Eyes:  Negative for blurred vision and double vision.  Respiratory:  Positive for cough, sputum production and shortness of breath. Negative for hemoptysis, wheezing and stridor.   Cardiovascular:  Negative for chest pain, palpitations, orthopnea, leg swelling and PND.  Gastrointestinal:  Negative for abdominal pain, constipation, diarrhea, heartburn,  nausea and vomiting.  Genitourinary:  Negative for dysuria, hematuria and urgency.  Musculoskeletal:  Negative for joint pain and myalgias.  Skin:  Negative for itching and rash.  Neurological:  Negative for dizziness, tingling, weakness and headaches.  Endo/Heme/Allergies:  Negative for environmental allergies. Does not bruise/bleed easily.  Psychiatric/Behavioral:  Negative for depression. The patient is not nervous/anxious and does not have insomnia.   All other systems reviewed and are negative.    Objective:  Physical Exam Vitals reviewed.  Constitutional:      General: She is not in acute distress.    Appearance: She is well-developed.  HENT:     Head: Normocephalic and atraumatic.  Eyes:     General: No scleral icterus.    Conjunctiva/sclera: Conjunctivae normal.     Pupils: Pupils are equal, round, and reactive to light.  Neck:     Vascular: No JVD.     Trachea: No tracheal deviation.  Cardiovascular:     Rate and Rhythm: Normal rate and regular rhythm.     Heart sounds: Normal heart sounds. No murmur heard. Pulmonary:     Effort: Pulmonary effort is normal. No tachypnea, accessory muscle usage or respiratory distress.     Breath sounds: No stridor. Rhonchi present. No wheezing or rales.  Abdominal:     General: There is no distension.     Palpations: Abdomen is soft.     Tenderness: There is no abdominal tenderness.  Musculoskeletal:        General: No tenderness.     Cervical back: Neck supple.  Lymphadenopathy:     Cervical: No cervical adenopathy.  Skin:    General: Skin is warm and dry.     Capillary Refill: Capillary refill takes less than 2 seconds.     Findings: No rash.  Neurological:     Mental Status: She is alert and oriented to person, place, and time.  Psychiatric:        Behavior: Behavior normal.      Vitals:   05/05/22 1015  BP: (!) 140/90  Pulse: 88  SpO2: 99%  Weight: 134 lb (60.8 kg)  Height: 5' 4"  (1.626 m)    99% on RA BMI  Readings from Last 3 Encounters:  05/05/22 23.00 kg/m  04/18/22 22.86 kg/m  04/01/22 23.17 kg/m   Wt Readings from Last 3 Encounters:  05/05/22 134 lb (60.8 kg)  04/18/22 133 lb 3 oz (60.4 kg)  04/01/22 135 lb (61.2 kg)     CBC    Component Value Date/Time   WBC 6.9 04/15/2022 1015   WBC 5.2 03/14/2021 1412   RBC 4.46 04/15/2022 1015   HGB 13.0 04/15/2022 1015   HGB 13.7 09/22/2020 1325   HCT 37.7 04/15/2022 1015   HCT 41.2 09/22/2020 1325   PLT 156 04/15/2022 1015   PLT 143 (L) 09/22/2020 1325   MCV 84.5 04/15/2022 1015   MCV 89 09/22/2020 1325   MCH 29.1 04/15/2022 1015   MCHC 34.5 04/15/2022 1015   RDW 14.2 04/15/2022 1015   RDW 12.9 09/22/2020 1325   LYMPHSABS 1.4 04/15/2022 1015   LYMPHSABS 2.1 09/22/2020 1325   MONOABS 0.4 04/15/2022 1015   EOSABS 0.1 04/15/2022 1015   EOSABS 0.1 09/22/2020 1325   BASOSABS 0.0 04/15/2022 1015   BASOSABS 0.0 09/22/2020 1325    Chest Imaging:  May 2020 CT chest: Scattered  nodules.  Lower lobe small areas of bronchiectasis. The patient's images have been independently reviewed by me.   PET scan 09/29/2020: SUV of 11, subcarinal adenopathy concerning for malignancy. The patient's images have been independently reviewed by me.    Chest x-ray 02/09/2021 CT chest 02/19/2021: Right upper lobe collapse.  Which is new from previous chest x-ray imaging.  CT chest 03/14/2021: Resolution of the right upper lobe collapse, mucous and tracheal debris. The patient's images have been independently reviewed by me.    07/31/2021: CT chest: Right upper lobe atelectasis, few scattered small pulmonary nodules.  She is seeing ID back soon.  CT was recommending a repeat CT scan follow-up in 4 to 6 weeks.  She has had this upper lobe collapse in the past. The patient's images have been independently reviewed by me.    04/15/2022 CT chest: Stable posttreatment changes within the right upper lobe volume loss in the hilum.  No obvious mass or  lymph node involvement. The patient's images have been independently reviewed by me.    Pulmonary Functions Testing Results:     No data to display          FeNO: none   Pathology: none   Echocardiogram: none  Heart Catheterization: none     Assessment & Plan:     ICD-10-CM   1. NSCLC of right lung ([place])  C34.91     2. Stage 2 moderate COPD by GOLD classification ([place])  J44.9     3. MAI (mycobacterium avium-intracellulare) ([place])  A31.0        Discussion:  This is a 67 year old female, moderate COPD FEV1 66% predicted, longstanding history of tobacco use was enrolled in a lung cancer screening program found to have a new enlarged subcarinal node concerning for non-small cell lung cancer.  She is a has completed chemotherapy and radiation as well as immune therapy.  She has bronchiectasis with MAI.  She was on antibiotic treatment with infectious disease for short period.  This was stopped and now she is doing airway clearance techniques.  Plan: Continue airway clearance with flutter valve plus vest therapy Continue nebulized treatments with albuterol Continue Trelegy Her CT scan is stable I have reviewed this with her today in the office.  Return to clinic in 6 months or as needed.   Current Outpatient Medications:    albuterol (PROVENTIL) (2.5 MG/3ML) 0.083% nebulizer solution, INHALE 3 MLS (2.5 MG TOTAL) VIA NEBULIZER EVERY 6 HOURS AS NEEDED FOR WHEEZING OR SHORTNESS OF BREATH, Disp: 540 mL, Rfl: 7   albuterol (VENTOLIN HFA) 108 (90 Base) MCG/ACT inhaler, Inhale 2 puffs into the lungs every 6 (six) hours as needed for wheezing or shortness of breath., Disp: , Rfl:    buPROPion (WELLBUTRIN XL) 300 MG 24 hr tablet, TAKE 1 TABLET DAILY, Disp: 90 tablet, Rfl: 1   Fluticasone-Umeclidin-Vilant (TRELEGY ELLIPTA) 100-62.5-25 MCG/ACT AEPB, USE 1 INHALATION DAILY, Disp: 180 each, Rfl: 3   Guaifenesin (MUCINEX MAXIMUM STRENGTH) 1200 MG TB12, Take 1,200 mg by mouth 2 (two) times  daily., Disp: , Rfl:    Respiratory Therapy Supplies (FLUTTER) DEVI, Use as directed, Disp: 1 each, Rfl: 0   acetaminophen (TYLENOL) 500 MG tablet, Take 500 mg by mouth every 6 (six) hours as needed for moderate pain or headache., Disp: , Rfl:    aspirin EC 81 MG tablet, Take 81 mg by mouth daily with lunch. , Disp: , Rfl:    benzonatate (TESSALON) 200 MG capsule, Take 1 capsule (200  mg total) by mouth 2 (two) times daily as needed for cough. (Patient not taking: Reported on 05/05/2022), Disp: 30 capsule, Rfl: 0   Cholecalciferol (DIALYVITE VITAMIN D 5000) 125 MCG (5000 UT) capsule, Take 5,000 Units by mouth daily., Disp: , Rfl:    doxycycline (VIBRA-TABS) 100 MG tablet, Take 1 tablet (100 mg total) by mouth 2 (two) times daily., Disp: 14 tablet, Rfl: 0   esomeprazole (NEXIUM) 40 MG capsule, TAKE 1 CAPSULE DAILY, Disp: 90 capsule, Rfl: 3   levothyroxine (SYNTHROID) 88 MCG tablet, Take 1 tablet (88 mcg total) by mouth daily., Disp: 30 tablet, Rfl: 1   Multiple Vitamins-Minerals (IMMUNE SUPPORT PO), Take 2 tablets by mouth daily., Disp: , Rfl:    rosuvastatin (CRESTOR) 20 MG tablet, Take 1 tablet (20 mg total) by mouth daily., Disp: 90 tablet, Rfl: 3   temazepam (RESTORIL) 15 MG capsule, Take 1 capsule (15 mg total) by mouth at bedtime as needed., Disp: 90 capsule, Rfl: 1   vitamin B-12 (CYANOCOBALAMIN) 1000 MCG tablet, Take 1,000 mcg by mouth daily., Disp: , Rfl:  No current facility-administered medications for this visit.  Facility-Administered Medications Ordered in Other Visits:    Sonafine emulsion 1 application, 1 application , Topical, Once, Gery Pray, MD    Garner Nash, DO [facility] 05/05/2022 10:30 AM

## 2022-05-05 NOTE — Patient Instructions (Addendum)
Thank you for visiting Dr. Valeta Harms at [facility]. Today we recommend the following:  Continue daily airway clearance techniques  Continue trelegy   Return in about 1 year (around 05/06/2023) for with Eric Form, NP, or Dr. Valeta Harms.    Please do your part to reduce the spread of COVID-19.

## 2022-05-20 ENCOUNTER — Other Ambulatory Visit: Payer: Self-pay | Admitting: Medical

## 2022-05-20 MED ORDER — BUPROPION HCL ER (XL) 300 MG PO TB24: 300.0000 mg | ORAL_TABLET | Freq: Every day | ORAL | 0 refills | Status: DC

## 2022-05-20 MED ORDER — TEMAZEPAM 15 MG PO CAPS: 15.0000 mg | ORAL_CAPSULE | Freq: Every evening | ORAL | 0 refills | Status: DC | PRN

## 2022-06-08 ENCOUNTER — Encounter: Payer: Self-pay | Admitting: Internal Medicine

## 2022-06-11 NOTE — Progress Notes (Unsigned)
Cardiology Office Note:    Date:  06/11/2022   ID:  Katrina Campbell, DOB 1956/01/24, MRN [id number]  PCP:  Katrina Hurl, PA-C   [facility] Providers Cardiologist:  Werner Lean, MD {   Referring MD: Katrina Hurl, PA-C   History of Present Illness:    Katrina Campbell is a 67 y.o. female with a hx of CAD, thoracic aortic aneurysm, pulmonary mycobacterium avium complex, HLD, COPD and stage III NSCLC on chemo who was followed by Dr. Meda Coffee now returning to clinic for follow-up.  Per review of the record, the patient was admitted to the hospital on 11/06/2017 with complaints of sudden onset of chest pain and dyspnea approximately 10 minutes after eating a carrot.  She was given nitroglycerin sublingually and apparently had about 30 seconds of syncope afterward.  Her systolic blood pressure dropped from 158 to 60.  She had a mild troponin elevation of 0.07 and a flat trend.  She underwent left heart cath on 11/07/2017 that showed mild nonobstructive CAD with 20% stenosis of her LAD and normal LV function.  Had coronary CTA on 03/03/20 for exertional chest pain with 25-49% distal LM, 25-49% prox LA, 25-49% prox-mid Lcx stenosis. Ca score 261 (90%). She was continued on medical management.   She was diagnosed with stage III NSCLC in 10/2020 for which she was started on carboplatin and paxclitaxel. TTE 12/24/20 with LVEF 55-60%, GLS -17%, normal RV, trivial MR, mild ascending aorta dilation 55m.  Was last seen in clinic on 06/2021 where she was doing well from a CV standpoint. TTE 12/2021 with normal LVEF 55-60%, normal GLS -23.4, normal RV, mild AI.  Today, the patient states that she received her last cancer treatment yesterday. She is also struggling with thyroid issues; she states that "it won't level out". It is felt this should resolve when she recovers from her cancer treatments.  Otherwise, she is doing well from a CV standpoint. Every now and then she has "jitters" or flutters,  usually while she is lying down at night. These palpitations are not overly bothersome and resolve within a couple of minutes.  She complains of some shortness of breath which she mostly attributes to her cancer. She is also very short of breath after bending over. She is still able to exercise without issue and walks on the treadmill for 30 minutes on an incline. Afterwards she feels well with no shortness of breath.   Additionally she was diagnosed with anemia yesterday. She was started on an iron supplement.  She denies any chest pain, or peripheral edema. No lightheadedness, syncope, orthopnea, or PND.   Past Medical History:  Diagnosis Date   Anxiety    COPD (chronic obstructive pulmonary disease) ([place]    Coronary artery disease    Dental staining 07/15/2019   Depression    History of radiation therapy    Right lung- 11/03/20-12/15/20- Dr. JGery Pray  Hyperlipidemia    Labile hypertension    Myocardial infarction ([facility] 2018   Pneumonia    Pulmonary mycobacterial infection ([place] 10/24/2018    Past Surgical History:  Procedure Laterality Date   BALLOON DILATION N/A 04/29/2021   Procedure: BALLOON DILATION;  Surgeon: JMilus Banister MD;  Location: [facility];  Service: Endoscopy;  Laterality: N/A;   BIOPSY  01/14/2021   Procedure: BIOPSY;  Surgeon: IGarner Nash DO;  [facility];  Service: Pulmonary;;   BRONCHIAL BIOPSY  10/08/2020   Procedure: BRONCHIAL BIOPSIES;  Surgeon: IJune Leap  L, DO;  Location: [place] [facility];  Service: Pulmonary;;   BRONCHIAL BRUSHINGS  10/08/2020   Procedure: BRONCHIAL BRUSHINGS;  Surgeon: Garner Nash, DO;  Location: [place] [facility];  Service: Pulmonary;;   BRONCHIAL BRUSHINGS  01/14/2021   Procedure: BRONCHIAL BRUSHINGS;  Surgeon: Garner Nash, DO;  Location: [place] [facility];  Service: Pulmonary;;   BRONCHIAL WASHINGS  10/08/2020   Procedure: BRONCHIAL WASHINGS;  Surgeon: Garner Nash, DO;  Location: [place] [facility];  Service:  Pulmonary;;   BRONCHIAL WASHINGS  01/14/2021   Procedure: BRONCHIAL WASHINGS;  Surgeon: Garner Nash, DO;  Location: [place] [facility];  Service: Pulmonary;;   ESOPHAGOGASTRODUODENOSCOPY (EGD) WITH PROPOFOL N/A 04/29/2021   Procedure: ESOPHAGOGASTRODUODENOSCOPY (EGD) WITH PROPOFOL;  Surgeon: Milus Banister, MD;  Location: [facility];  Service: Endoscopy;  Laterality: N/A;   FINE NEEDLE ASPIRATION  10/08/2020   Procedure: FINE NEEDLE ASPIRATION (FNA) LINEAR;  Surgeon: Garner Nash, DO;  Location: [place] [facility];  Service: Pulmonary;;   LEFT HEART CATH AND CORONARY ANGIOGRAPHY N/A 11/07/2017   Procedure: LEFT HEART CATH AND CORONARY ANGIOGRAPHY;  Surgeon: Nelva Bush, MD;  Location: [place] [facility];  Service: Cardiovascular;  Laterality: N/A;   NASAL SINUS SURGERY  1986   VIDEO BRONCHOSCOPY Bilateral 01/29/2019   Procedure: VIDEO BRONCHOSCOPY WITH FLUORO;  Surgeon: Marshell Garfinkel, MD;  Location: [place];  Service: Cardiopulmonary;  Laterality: Bilateral;   VIDEO BRONCHOSCOPY Right 01/14/2021   Procedure: VIDEO BRONCHOSCOPY WITHOUT FLUORO;  Surgeon: Garner Nash, DO;  Location: [place];  Service: Pulmonary;  Laterality: Right;  possible cryotherapy   VIDEO BRONCHOSCOPY WITH ENDOBRONCHIAL ULTRASOUND N/A 10/08/2020   Procedure: VIDEO BRONCHOSCOPY WITH ENDOBRONCHIAL ULTRASOUND;  Surgeon: Garner Nash, DO;  Location: [place];  Service: Pulmonary;  Laterality: N/A;    Current Medications: No outpatient medications have been marked as taking for the 06/13/22 encounter (Appointment) with Freada Bergeron, MD.     Allergies:   Breztri aerosphere [budeson-glycopyrrol-formoterol], Benadryl [diphenhydramine], Codeine, Prednisone, and Statins   Social History   Socioeconomic History   Marital status: Married    Spouse name: Not on file   Number of children: Not on file   Years of education: Not on file   Highest education level: Not on file  Occupational History    Not on file  Tobacco Use   Smoking status: Former    Packs/day: 1.75    Years: 44.00    Total pack years: 77.00    Types: Cigarettes    Quit date: 11/09/2012    Years since quitting: 9.5   Smokeless tobacco: Never   Tobacco comments:    vapor  Vaping Use   Vaping Use: Former  Substance and Sexual Activity   Alcohol use: Not Currently    Alcohol/week: 24.0 standard drinks of alcohol    Types: 24 Cans of beer per week   Drug use: Not Currently   Sexual activity: Not Currently  Other Topics Concern   Not on file  Social History Narrative   Not on file   Social Determinants of Health   Financial Resource Strain: Medium Risk (10/29/2020)   Overall Financial Resource Strain (CARDIA)    Difficulty of Paying Living Expenses: Somewhat hard  Food Insecurity: No Food Insecurity (10/29/2020)   Hunger Vital Sign    Worried About Running Out of Food in the Last Year: Never true    Ran Out of Food in the Last Year: Never true  Transportation Needs: No Transportation Needs (10/29/2020)   [place] -  Hydrologist (Medical): No    Lack of Transportation (Non-Medical): No  Physical Activity: Not on file  Stress: Stress Concern Present (10/29/2020)   [place]    Feeling of Stress : Rather much  Social Connections: Unknown (10/29/2020)   Social Connection and Isolation Panel [NHANES]    Frequency of Communication with Friends and Family: More than three times a week    Frequency of Social Gatherings with Friends and Family: More than three times a week    Attends Religious Services: More than 4 times per year    Active Member of [employer] or Organizations: Not on file    Attends Music therapist: Not on file    Marital Status: Married     Family History: The patient's family history includes Asthma in an other family member; Congestive Heart Failure in her father and mother; Emphysema in  her mother; Heart attack in her father and mother; Lung disease in her brother and sister. There is no history of Colon cancer.  ROS:   Review of Systems  Constitutional:  Negative for diaphoresis and fever.  HENT:  Negative for hearing loss and nosebleeds.   Eyes:  Negative for pain and discharge.  Respiratory:  Positive for shortness of breath. Negative for sputum production.   Cardiovascular:  Positive for palpitations. Negative for chest pain, orthopnea, claudication, leg swelling and PND.  Gastrointestinal:  Negative for blood in stool and melena.  Genitourinary:  Negative for hematuria.  Musculoskeletal:  Negative for falls.  Neurological:  Positive for headaches. Negative for dizziness and loss of consciousness.  Endo/Heme/Allergies:  Negative for environmental allergies.  Psychiatric/Behavioral:  Negative for hallucinations and memory loss.      EKGs/Labs/Other Studies Reviewed:    The following studies were reviewed today:  TTE 12/2021: IMPRESSIONS    1. Left ventricular ejection fraction, by estimation, is 55 to 60%. The  left ventricle has normal function. The left ventricle has no regional  wall motion abnormalities. Left ventricular diastolic parameters are  consistent with Grade I diastolic  dysfunction (impaired relaxation). Elevated left ventricular end-diastolic  pressure. The average left ventricular global longitudinal strain is -23.4  %. The global longitudinal strain is normal.   2. Right ventricular systolic function is normal. The right ventricular  size is normal.   3. The mitral valve is normal in structure. Trivial mitral valve  regurgitation. No evidence of mitral stenosis.   4. The aortic valve is tricuspid. Aortic valve regurgitation is mild. No  aortic stenosis is present.   5. The inferior vena cava is normal in size with greater than 50%  respiratory variability, suggesting right atrial pressure of 3 mmHg.   CTA Chest 03/14/2021: COMPARISON:   02/19/2021   FINDINGS: Cardiovascular: No filling defects in the pulmonary arteries to suggest pulmonary emboli. Heart is normal size. Aorta is normal caliber. Coronary artery and aortic calcifications.   Mediastinum/Nodes: No mediastinal, hilar, or axillary adenopathy. Material noted in the distal trachea thyroid unremarkable.   Lungs/Pleura: Improvement in aeration in the right upper lobe with re-expansion of the right upper lobe. Radiation changes noted medially within the right lung. Diffuse bronchial wall thickening on the right. Moderate emphysema. No effusions.   Upper Abdomen: Imaging into the upper abdomen demonstrates no acute findings.   Musculoskeletal: Chest wall soft tissues are unremarkable. No acute bony abnormality.   Review of the MIP images confirms the above findings.  IMPRESSION: No evidence of pulmonary embolus.   Near complete re-expansion of the right upper lobe. Emphysema, bronchial wall thickening again noted, unchanged.   Mucus/debris in the distal trachea.   Coronary artery disease.   Aortic Atherosclerosis (ICD10-I70.0) and Emphysema (ICD10-J43.9).  TTE 12/24/20: IMPRESSIONS    1. Left ventricular ejection fraction, by estimation, is 55 to 60%. Left  ventricular ejection fraction by 3D volume is 62 %. The left ventricle has  normal function. The left ventricle has no regional wall motion  abnormalities. Left ventricular diastolic   parameters are indeterminate. The average left ventricular global  longitudinal strain is -17.7 %. The global longitudinal strain is normal.   2. Right ventricular systolic function is normal. The right ventricular  size is normal. Tricuspid regurgitation signal is inadequate for assessing  PA pressure.   3. Left atrial size was mildly dilated.   4. The mitral valve is normal in structure. Trivial mitral valve  regurgitation. No evidence of mitral stenosis.   5. The aortic valve is tricuspid. Aortic valve  regurgitation is trivial.  No aortic stenosis is present.   6. Aortic dilatation noted. There is mild dilatation of the ascending  aorta, measuring 40 mm.   7. The inferior vena cava is normal in size with greater than 50%  respiratory variability, suggesting right atrial pressure of 3 mmHg.   Comparison(s): No significant change from prior study.   Coronary CTA 02/2020: FINDINGS: A 100 kV prospective scan was triggered in the descending thoracic aorta at 111 HU's. Axial non-contrast 3 mm slices were carried out through the heart. The data set was analyzed on a dedicated work station and scored using the [employer]. Gantry rotation speed was 250 msecs and collimation was .6 mm. Metoprolol 100 mg PO and 0.8 mg of sl NTG was given. The 3D data set was reconstructed in 5% intervals of the 67-82 % of the R-R cycle. Diastolic phases were analyzed on a dedicated work station using MPR, MIP and VRT modes. The patient received 80 cc of contrast.   Aorta: Normal size. Mild diffuse atherosclerotic disease and calcifications. No dissection.   Aortic Valve:  Trileaflet.  No calcifications.   Coronary Arteries:  Normal coronary origin.  Left dominance.   RCA is a small non-dominant artery with luminal irregularities.   Left main is a large artery that gives rise to LAD and LCX arteries. Distal left main has mild to moderate eccentric calcified plaque with stenosis 25-49%.   LAD is a large vessel that gives rise to two diagonal arteries. There is mild calcified plaque in the proximal LAD with stenosis 25-49%. Mid and distal LAD has minimal plaque.   D1,2 have only minimal irregularities.   LCX is a large dominant artery that gives rise to PDA and PLA. There is mild calcified plaque in the proximal and mid LCX artery with stenosis 25-49%, otherwise only minimal non-calcified plaque.   Other findings:   Normal pulmonary vein drainage into the left atrium.   Normal left atrial  appendage without a thrombus.   Normal size of the pulmonary artery.   IMPRESSION: 1. Coronary calcium score of 261. This was 59 percentile for age and sex matched control.   2. Normal coronary origin with left dominance.   3. CAD-RADS 2. Mild non-obstructive CAD (25-49%) in the distal left main and proximal portions of LAD and LCX arteries. Consider non-atherosclerotic causes of dyspnea. Consider preventive therapy and risk factor modification.  Transthoracic Echocardiogram: Date: 03/11/2020 Results: 1.  Left ventricular ejection fraction, by estimation, is 55 to 60%. The  left ventricle has normal function. The left ventricle has no regional  wall motion abnormalities. Left ventricular diastolic parameters are  consistent with Grade I diastolic  dysfunction (impaired relaxation).   2. Right ventricular systolic function is normal. The right ventricular  size is normal. Tricuspid regurgitation signal is inadequate for assessing  PA pressure.   3. The mitral valve is normal in structure. Trivial mitral valve  regurgitation. No evidence of mitral stenosis.   4. The aortic valve is tricuspid. Aortic valve regurgitation is trivial.  No aortic stenosis is present.   5. Aortic dilatation noted. There is mild dilatation of the ascending  aorta, measuring 41 mm.   6. The inferior vena cava is normal in size with greater than 50%  respiratory variability, suggesting right atrial pressure of 3 mmHg.    NonCardiac CT: Date: 09/21/20 Results: IMPRESSION: 1. Subcarinal lymphadenopathy, increased since 06/19/2020 CT and new since 12/11/2019 CT, concerning for metastatic disease. Persistent branching tubular opacity in the anterior central right upper lobe associated with abrupt cut off of a segmental anterior right upper lobe bronchus, not substantially changed since 06/19/2020 CT. Underlying endobronchial malignancy not excluded. Multidisciplinary thoracic oncology consultation  suggested. PET-CT recommended for further characterization. 2. Severe centrilobular emphysema with diffuse bronchial wall thickening, compatible with the provided history of COPD. 3. Left main and two-vessel coronary atherosclerosis. 4. Aortic Atherosclerosis (ICD10-I70.0) and Emphysema (ICD10-J43.9). 5. Mild TAA 41 mm   NM Stress Testing: Date: 06/28/2016 Results: Nuclear stress EF: 58%. Blood pressure demonstrated a normal response to exercise. There was no ST segment deviation noted during stress. Defect 1: There is a small defect of moderate severity present in the apical anterior and apex location. The study is normal. This is a low risk study. The left ventricular ejection fraction is normal (55-65%).   Normal stress nuclear study with breast attenuation but no ischemia; EF 58 with normal wall motion.   Left/Right Heart Catheterizations: Date: 11/07/2017 Results: Conclusions: Eccentric calcification with mild stenosis of the ostial LAD (~20%).  Otherwise, no angiographically significant coronary artery disease. Normal left ventricular systolic function and filling pressure.   Recommendations: Continue medical therapy and risk factor modification to prevent progression of CAD. Monitor overnight to ensure the patient does not have recurrent chest pain.  Anticipate discharge home tomorrow morning.   Recommend Aspirin '81mg'$  daily for mild CAD and thoracic aortic aneurysm.   EKG:  EKG is personally reviewed.  12/17/2021:  Sinus rhythm. Rate 79 bpm. 06/04/2021: EKG was not ordered. 03/14/2021 (ED): Sinus rhythm. Rate 99 bpm. Anterior infarct, old.  Recent Labs: 04/15/2022: ALT 21; BUN 12; Creatinine 0.72; Hemoglobin 13.0; Platelet Count 156; Potassium 4.4; Sodium 140 04/26/2022: TSH 0.125   Recent Lipid Panel    Component Value Date/Time   CHOL 154 12/15/2021 0744   TRIG 88 12/15/2021 0744   HDL 65 12/15/2021 0744   CHOLHDL 2.4 12/15/2021 0744   CHOLHDL 3.4 02/21/2018 0845    VLDL 38 (H) 12/30/2015 1536   LDLCALC 73 12/15/2021 0744   LDLCALC 83 02/21/2018 0845     Risk Assessment/Calculations:           Physical Exam:    VS:  There were no vitals taken for this visit.    Wt Readings from Last 3 Encounters:  05/05/22 134 lb (60.8 kg)  04/18/22 133 lb 3 oz (60.4 kg)  04/01/22 135 lb (61.2 kg)     GEN: Well  nourished, well developed in no acute distress HEENT: Normal NECK: No JVD; No carotid bruits CARDIAC: RRR, 1/6 soft systolic murmur, No rubs, No gallops RESPIRATORY:  Clear to auscultation without rales, wheezing or rhonchi  ABDOMEN: Soft, non-tender, non-distended MUSCULOSKELETAL:  No edema; No deformity  SKIN: Warm and dry NEUROLOGIC:  Alert and oriented x 3 PSYCHIATRIC:  Normal affect   ASSESSMENT:    No diagnosis found.   PLAN:    In order of problems listed above:  #Mild Nonobstructive CAD: Patient with mild disease on cath in 2019 and mild multivessel disease on coronary CTA in 2021. Ca score 261 (90%) currently on medical management. No anginal symptoms. TTE with normal BiV function. -Continue crestor '20mg'$ , zetia '10mg'$  daily  -Continue ASA '81mg'$  daily  #NSCLC on chemo/XRT and immunotherapy: Stage IIIA non small cell lung cancer diagnosed in 10/2020. Received carboplatin and paclitaxel. Also on immunotherapy with Imfinzi. Followed by Dr. Julien Nordmann. TTE 12/2021 with EF 55-60%, GLS 23.4% -Follow-up with onc as scheduled -TTE with strain with EF 55-60% with GLS 23.4%  #TAA: Stable on most recent TTE measuring 65m. CT chest 07/2021 stable at 4cm. -Continue serial monitor with serial imaging (can use CT scan obtained by Onc as well)  #HLD: -Continue crestor '20mg'$  daily -Continue zetia '10mg'$  daily -LDL 73 in 12/2021  #Mild AI: -Continue serial monitoring with TTE with next 2026      Follow-up:   6 months.  Medication Adjustments/Labs and Tests Ordered: Current medicines are reviewed at length with the patient today.  Concerns  regarding medicines are outlined above.   No orders of the defined types were placed in this encounter.  No orders of the defined types were placed in this encounter.  There are no Patient Instructions on file for this visit.   I,Mathew Stumpf,acting as a sEducation administratorfor HFreada Bergeron MD.,have documented all relevant documentation on the behalf of HFreada Bergeron MD,as directed by  HFreada Bergeron MD while in the presence of HFreada Bergeron MD.   I, HFreada Bergeron MD, have reviewed all documentation for this visit. The documentation on 06/11/22 for the exam, diagnosis, procedures, and orders are all accurate and complete.   Signed, HFreada Bergeron MD  06/11/2022 12:45 PM    [place]

## 2022-06-13 ENCOUNTER — Encounter: Payer: Self-pay | Admitting: Cardiology

## 2022-06-13 ENCOUNTER — Ambulatory Visit: Payer: Medicare Other | Attending: Cardiology | Admitting: Cardiology

## 2022-06-13 VITALS — BP 134/88 | HR 83 | Ht 64.0 in | Wt 136.8 lb

## 2022-06-13 DIAGNOSIS — C801 Malignant (primary) neoplasm, unspecified: Secondary | ICD-10-CM | POA: Insufficient documentation

## 2022-06-13 DIAGNOSIS — I77819 Aortic ectasia, unspecified site: Secondary | ICD-10-CM | POA: Diagnosis present

## 2022-06-13 DIAGNOSIS — E782 Mixed hyperlipidemia: Secondary | ICD-10-CM | POA: Insufficient documentation

## 2022-06-13 DIAGNOSIS — I7121 Aneurysm of the ascending aorta, without rupture: Secondary | ICD-10-CM

## 2022-06-13 DIAGNOSIS — I2510 Atherosclerotic heart disease of native coronary artery without angina pectoris: Secondary | ICD-10-CM | POA: Insufficient documentation

## 2022-06-13 LAB — LIPID PANEL
Chol/HDL Ratio: 2.5 ratio (ref 0.0–4.4)
Cholesterol, Total: 157 mg/dL (ref 100–199)
HDL: 62 mg/dL (ref >39)
LDL Chol Calc (NIH): 79 mg/dL (ref 0–99)
Triglycerides: 85 mg/dL (ref 0–149)
VLDL Cholesterol Cal: 16 mg/dL (ref 5–40)

## 2022-06-13 NOTE — Patient Instructions (Signed)
Medication Instructions:   Your physician recommends that you continue on your current medications as directed. Please refer to the Current Medication list given to you today.  *If you need a refill on your cardiac medications before your next appointment, please call your pharmacy*   Lab Work:  TODAY--LIPIDS  If you have labs (blood work) drawn today and your tests are completely normal, you will receive your results only by: [place] (if you have MyChart) OR A paper copy in the mail If you have any lab test that is abnormal or we need to change your treatment, we will call you to review the results.   Follow-Up: At [facility], you and your health needs are our priority.  As part of our continuing mission to provide you with exceptional heart care, we have created designated Provider Care Teams.  These Care Teams include your primary Cardiologist (physician) and Advanced Practice Providers (APPs -  Physician Assistants and Nurse Practitioners) who all work together to provide you with the care you need, when you need it.  We recommend signing up for the patient portal called "MyChart".  Sign up information is provided on this After Visit Summary.  MyChart is used to connect with patients for Virtual Visits (Telemedicine).  Patients are able to view lab/test results, encounter notes, upcoming appointments, etc.  Non-urgent messages can be sent to your provider as well.   To learn more about what you can do with MyChart, go to [email or web address].    Your next appointment:   1 year(s)  Provider:   DR. Johney Frame

## 2022-06-14 ENCOUNTER — Telehealth: Payer: Self-pay | Admitting: Cardiology

## 2022-06-14 NOTE — Telephone Encounter (Signed)
  Nuala Alpha, LPN 0/01/711  1:97 PM EDT Back to Top    The patient has been notified of the result and verbalized understanding.  All questions (if any) were answered.   Pt states she will proceed with healthy lifestyle modification vs taking zetia.   Pt verbalized understanding and agrees with this plan.   Will forward this information to Dr. Johney Frame as an Juluis Rainier.   Nuala Alpha, LPN 5/88/3254  9:82 PM EDT     Left message for the pt to call back for results.   Freada Bergeron, MD 06/14/2022 12:39 PM EDT     Cholesterol is slightly above goal (Goal LDL <70). Does she want to try zetia 10mg  daily to get her to goal or continue with lifestyle?

## 2022-06-14 NOTE — Telephone Encounter (Signed)
Pt returning call for lab results. Pt states she read her results in mychart. She stated she is not interested in started zetia.

## 2022-06-20 MED ORDER — LEVOTHYROXINE SODIUM 88 MCG PO TABS: 88.0000 ug | ORAL_TABLET | Freq: Every day | ORAL | 1 refills | Status: DC

## 2022-07-13 ENCOUNTER — Encounter: Payer: Self-pay | Admitting: Medical Oncology

## 2022-07-13 ENCOUNTER — Encounter: Payer: Self-pay | Admitting: Internal Medicine

## 2022-07-14 ENCOUNTER — Inpatient Hospital Stay: Payer: Medicare Other | Attending: Hematology and Oncology

## 2022-07-14 ENCOUNTER — Other Ambulatory Visit: Payer: Medicare Other

## 2022-07-14 ENCOUNTER — Other Ambulatory Visit: Payer: Self-pay | Admitting: Medical Oncology

## 2022-07-14 ENCOUNTER — Other Ambulatory Visit: Payer: Self-pay

## 2022-07-14 ENCOUNTER — Ambulatory Visit (HOSPITAL_COMMUNITY)
Admission: RE | Admit: 2022-07-14 | Discharge: 2022-07-14 | Disposition: A | Discharge status: Home / Self Care | Payer: Medicare Other | Source: Ambulatory Visit | Attending: Internal Medicine | Admitting: Internal Medicine

## 2022-07-14 DIAGNOSIS — E039 Hypothyroidism, unspecified: Secondary | ICD-10-CM | POA: Diagnosis not present

## 2022-07-14 DIAGNOSIS — D649 Anemia, unspecified: Secondary | ICD-10-CM | POA: Insufficient documentation

## 2022-07-14 DIAGNOSIS — C3490 Malignant neoplasm of unspecified part of unspecified bronchus or lung: Secondary | ICD-10-CM | POA: Insufficient documentation

## 2022-07-14 DIAGNOSIS — Z9221 Personal history of antineoplastic chemotherapy: Secondary | ICD-10-CM | POA: Diagnosis not present

## 2022-07-14 DIAGNOSIS — Z85118 Personal history of other malignant neoplasm of bronchus and lung: Secondary | ICD-10-CM | POA: Diagnosis present

## 2022-07-14 DIAGNOSIS — Z923 Personal history of irradiation: Secondary | ICD-10-CM | POA: Insufficient documentation

## 2022-07-14 LAB — CBC WITH DIFFERENTIAL (CANCER CENTER ONLY)
Abs Immature Granulocytes: 0.01 10*3/uL (ref 0.00–0.07)
Basophils Absolute: 0 10*3/uL (ref 0.0–0.1)
Basophils Relative: 0 %
Eosinophils Absolute: 0.1 10*3/uL (ref 0.0–0.5)
Eosinophils Relative: 1 %
HCT: 39.7 % (ref 36.0–46.0)
Hemoglobin: 13.8 g/dL (ref 12.0–15.0)
Immature Granulocytes: 0 %
Lymphocytes Relative: 19 %
Lymphs Abs: 1.1 10*3/uL (ref 0.7–4.0)
MCH: 30.3 pg (ref 26.0–34.0)
MCHC: 34.8 g/dL (ref 30.0–36.0)
MCV: 87.1 fL (ref 80.0–100.0)
Monocytes Absolute: 0.4 10*3/uL (ref 0.1–1.0)
Monocytes Relative: 7 %
Neutro Abs: 4.2 10*3/uL (ref 1.7–7.7)
Neutrophils Relative %: 73 %
Platelet Count: 136 10*3/uL — ABNORMAL LOW (ref 150–400)
RBC: 4.56 MIL/uL (ref 3.87–5.11)
RDW: 13.4 % (ref 11.5–15.5)
WBC Count: 5.8 10*3/uL (ref 4.0–10.5)
nRBC: 0 % (ref 0.0–0.2)

## 2022-07-14 LAB — CMP (CANCER CENTER ONLY)
ALT: 29 U/L (ref 0–44)
AST: 24 U/L (ref 15–41)
Albumin: 4.3 g/dL (ref 3.5–5.0)
Alkaline Phosphatase: 80 U/L (ref 38–126)
Anion gap: 7 (ref 5–15)
BUN: 14 mg/dL (ref 8–23)
CO2: 25 mmol/L (ref 22–32)
Calcium: 9.3 mg/dL (ref 8.9–10.3)
Chloride: 104 mmol/L (ref 98–111)
Creatinine: 0.81 mg/dL (ref 0.44–1.00)
GFR, Estimated: >60 mL/min (ref >60)
Glucose, Bld: 93 mg/dL (ref 70–99)
Potassium: 4.1 mmol/L (ref 3.5–5.1)
Sodium: 136 mmol/L (ref 135–145)
Total Bilirubin: 0.4 mg/dL (ref 0.3–1.2)
Total Protein: 7 g/dL (ref 6.5–8.1)

## 2022-07-14 LAB — TSH: TSH: 1.23 u[IU]/mL (ref 0.350–4.500)

## 2022-07-14 MED ORDER — IOHEXOL 300 MG/ML  SOLN
75.0000 mL | Freq: Once | INTRAMUSCULAR | Status: AC | PRN
  Administered 2022-07-14: 75 mL via INTRAVENOUS

## 2022-07-14 MED ORDER — SODIUM CHLORIDE (PF) 0.9 % IJ SOLN
INTRAMUSCULAR | Status: AC
  Filled 2022-07-14: qty 50

## 2022-07-20 ENCOUNTER — Inpatient Hospital Stay (HOSPITAL_BASED_OUTPATIENT_CLINIC_OR_DEPARTMENT_OTHER): Payer: Medicare Other | Admitting: Internal Medicine

## 2022-07-20 ENCOUNTER — Other Ambulatory Visit: Payer: Self-pay

## 2022-07-20 VITALS — BP 160/84 | HR 76 | Temp 98.3°F | Resp 15 | Wt 137.3 lb

## 2022-07-20 DIAGNOSIS — C3490 Malignant neoplasm of unspecified part of unspecified bronchus or lung: Secondary | ICD-10-CM

## 2022-07-20 DIAGNOSIS — E038 Other specified hypothyroidism: Secondary | ICD-10-CM | POA: Diagnosis not present

## 2022-07-20 DIAGNOSIS — Z85118 Personal history of other malignant neoplasm of bronchus and lung: Secondary | ICD-10-CM | POA: Diagnosis not present

## 2022-07-20 NOTE — Progress Notes (Signed)
[facility] [phone]   [phone]  OFFICE PROGRESS NOTE  Jac Canavan, PA-C [address] [place] [address]  DIAGNOSIS: Stage IIIA (TX, N2, M0) non-small cell lung cancer, squamous cell carcinoma presented with subcarinal mass/lymphadenopathy diagnosed in July 2022.   PRIOR THERAPY:  1) Weekly carboplatin for AUC of 2 and paclitaxel 45 mg/m2.  First dose expected on 11/02/2020.  Status post 7 cycles.  Last dose was given 12/14/2020. 2) Consolidation treatment with immunotherapy with Imfinzi 1500 Mg IV every 4 weeks.  First dose 01/14/2021.  Status post 13 cycles   CURRENT THERAPY: Observation.  INTERVAL HISTORY: Katrina Campbell 67 y.o. female returns to the clinic today for follow-up visit.  The patient is feeling fine today with no concerning complaints.  She denied having any current chest pain, shortness of breath, cough or hemoptysis.  She has no nausea, vomiting, diarrhea or constipation.  She has no headache or visual changes.  She denied having any recent weight loss or night sweats.  She is here today for evaluation with repeat CT scan of the chest for restaging of her disease.  MEDICAL HISTORY: Past Medical History:  Diagnosis Date   Anxiety    COPD (chronic obstructive pulmonary disease) (HCC)    Coronary artery disease    Dental staining 07/15/2019   Depression    History of radiation therapy    Right lung- 11/03/20-12/15/20- Dr. 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  MEDICATIONS:  Current Outpatient Medications  Medication Sig Dispense Refill   acetaminophen (TYLENOL) 500 MG tablet Take 500 mg by mouth every 6 (six) hours as needed for moderate pain or  headache.     albuterol (PROVENTIL) (2.5 MG/3ML) 0.083% nebulizer solution INHALE 3 MLS (2.5 MG TOTAL) VIA NEBULIZER EVERY 6 HOURS AS NEEDED FOR WHEEZING OR SHORTNESS OF BREATH 540 mL 7   albuterol (VENTOLIN HFA) 108 (90 Base) MCG/ACT inhaler Inhale 2 puffs into the lungs every 6 (six) hours as needed for wheezing or shortness of breath.     aspirin EC 81 MG tablet Take 81 mg by mouth daily with lunch.      buPROPion (WELLBUTRIN XL) 300 MG 24 hr tablet Take 1 tablet (300 mg total) by mouth daily. 90 tablet 0   Cholecalciferol (DIALYVITE VITAMIN D 5000) 125 MCG (5000 UT) capsule Take 5,000 Units by mouth daily.     esomeprazole (NEXIUM) 40 MG capsule TAKE 1 CAPSULE DAILY 90 capsule 3   Fluticasone-Umeclidin-Vilant (TRELEGY ELLIPTA) 100-62.5-25 MCG/ACT AEPB USE 1 INHALATION DAILY 180 each 3   Guaifenesin (MUCINEX MAXIMUM STRENGTH) 1200 MG TB12 Take 1,200 mg by mouth 2 (two) times daily.     levothyroxine (SYNTHROID) 88 MCG tablet Take 1 tablet (88 mcg total) by mouth daily. 30 tablet 1   Multiple Vitamins-Minerals (IMMUNE SUPPORT PO) Take 2 tablets by mouth daily.     Respiratory Therapy Supplies (FLUTTER) DEVI Use as directed 1 each 0   rosuvastatin (CRESTOR) 20 MG tablet Take 1 tablet (20 mg total) by mouth daily. 90 tablet 3   temazepam (RESTORIL) 15 MG capsule Take 1 capsule (15 mg total) by mouth at bedtime as needed. 90 capsule 0   vitamin B-12 (CYANOCOBALAMIN) 1000 MCG tablet Take 1,000 mcg  by mouth daily.     No current facility-administered medications for this visit.   Facility-Administered Medications Ordered in Other Visits  Medication Dose Route Frequency Provider Last Rate Last Admin   Sonafine emulsion 1 application  1 application  Topical Once Antony Blackbird, MD        SURGICAL HISTORY:  Past Surgical History:  Procedure Laterality Date   BALLOON DILATION N/A 04/29/2021   Procedure: BALLOON DILATION;  Surgeon: Rachael Fee, MD;  Location: [facility];  Service: Endoscopy;   Laterality: N/A;   BIOPSY  01/14/2021   Procedure: BIOPSY;  Surgeon: Josephine Igo, DO;  Location: [facility];  Service: Pulmonary;;   BRONCHIAL BIOPSY  10/08/2020   Procedure: BRONCHIAL BIOPSIES;  Surgeon: Josephine Igo, DO;  Location: [facility];  Service: Pulmonary;;   BRONCHIAL BRUSHINGS  10/08/2020   Procedure: BRONCHIAL BRUSHINGS;  Surgeon: Josephine Igo, DO;  Location: [facility];  Service: Pulmonary;;   BRONCHIAL BRUSHINGS  01/14/2021   Procedure: BRONCHIAL BRUSHINGS;  Surgeon: Josephine Igo, DO;  Location: [facility];  Service: Pulmonary;;   BRONCHIAL WASHINGS  10/08/2020   Procedure: BRONCHIAL WASHINGS;  Surgeon: Josephine Igo, DO;  Location: [facility];  Service: Pulmonary;;   BRONCHIAL WASHINGS  01/14/2021   Procedure: BRONCHIAL WASHINGS;  Surgeon: Josephine Igo, DO;  Location: [facility];  Service: Pulmonary;;   ESOPHAGOGASTRODUODENOSCOPY (EGD) WITH PROPOFOL N/A 04/29/2021   Procedure: ESOPHAGOGASTRODUODENOSCOPY (EGD) WITH PROPOFOL;  Surgeon: Rachael Fee, MD;  Location: [facility];  Service: Endoscopy;  Laterality: N/A;   FINE NEEDLE ASPIRATION  10/08/2020   Procedure: FINE NEEDLE ASPIRATION (FNA) LINEAR;  Surgeon: Josephine Igo, DO;  Location: [facility];  Service: Pulmonary;;   LEFT HEART CATH AND CORONARY ANGIOGRAPHY N/A 11/07/2017   Procedure: LEFT HEART CATH AND CORONARY ANGIOGRAPHY;  Surgeon: Yvonne Kendall, MD;  Location: [facility];  Service: Cardiovascular;  Laterality: N/A;   NASAL SINUS SURGERY  1986   VIDEO BRONCHOSCOPY Bilateral 01/29/2019   Procedure: VIDEO BRONCHOSCOPY WITH FLUORO;  Surgeon: Chilton Greathouse, MD;  Location: [facility];  Service: Cardiopulmonary;  Laterality: Bilateral;   VIDEO BRONCHOSCOPY Right 01/14/2021   Procedure: VIDEO BRONCHOSCOPY WITHOUT FLUORO;  Surgeon: Josephine Igo, DO;  Location: [facility];  Service: Pulmonary;  Laterality: Right;  possible cryotherapy   VIDEO BRONCHOSCOPY WITH  ENDOBRONCHIAL ULTRASOUND N/A 10/08/2020   Procedure: VIDEO BRONCHOSCOPY WITH ENDOBRONCHIAL ULTRASOUND;  Surgeon: Josephine Igo, DO;  Location: [facility];  Service: Pulmonary;  Laterality: N/A;    REVIEW OF SYSTEMS:  A comprehensive review of systems was negative.   PHYSICAL EXAMINATION: General appearance: alert, cooperative, and no distress Head: Normocephalic, without obvious abnormality, atraumatic Neck: no adenopathy, no JVD, supple, symmetrical, trachea midline, and thyroid not enlarged, symmetric, no tenderness/mass/nodules Lymph nodes: Cervical, supraclavicular, and axillary nodes normal. Resp: clear to auscultation bilaterally Back: symmetric, no curvature. ROM normal. No CVA tenderness. Cardio: regular rate and rhythm, S1, S2 normal, no murmur, click, rub or gallop GI: soft, non-tender; bowel sounds normal; no masses,  no organomegaly Extremities: extremities normal, atraumatic, no cyanosis or edema  ECOG PERFORMANCE STATUS: 1 - Symptomatic but completely ambulatory  Blood pressure (!) 160/84, pulse 76, temperature 98.3 F (36.8 C), temperature source Oral, resp. rate 15, weight 137 lb 4.8 oz (62.3 kg), SpO2 96 %.  LABORATORY DATA: Lab Results  Component Value Date   WBC 5.8 07/14/2022   HGB 13.8 07/14/2022   HCT 39.7 07/14/2022   MCV 87.1 07/14/2022  PLT 136 (L) 07/14/2022      Chemistry      Component Value Date/Time   NA 136 07/14/2022 1217   NA 144 02/10/2020 0813   K 4.1 07/14/2022 1217   CL 104 07/14/2022 1217   CO2 25 07/14/2022 1217   BUN 14 07/14/2022 1217   BUN 10 02/10/2020 0813   CREATININE 0.81 07/14/2022 1217   CREATININE 0.96 07/22/2021 0936      Component Value Date/Time   CALCIUM 9.3 07/14/2022 1217   ALKPHOS 80 07/14/2022 1217   AST 24 07/14/2022 1217   ALT 29 07/14/2022 1217   BILITOT 0.4 07/14/2022 1217       RADIOGRAPHIC STUDIES: CT Chest W Contrast  Result Date: 07/17/2022 CLINICAL DATA:  Non-small-cell lung cancer.   Restaging. EXAM: CT CHEST WITH CONTRAST TECHNIQUE: Multidetector CT imaging of the chest was performed during intravenous contrast administration. RADIATION DOSE REDUCTION: This exam was performed according to the departmental dose-optimization program which includes automated exposure control, adjustment of the mA and/or kV according to patient size and/or use of iterative reconstruction technique. CONTRAST:  12mL OMNIPAQUE IOHEXOL 300 MG/ML  SOLN COMPARISON:  04/15/2022 FINDINGS: Cardiovascular: The heart size is normal. No substantial pericardial effusion. Coronary artery calcification is evident. Ascending thoracic aorta measures 4 cm diameter. Mild atherosclerotic calcification is noted in the wall of the thoracic aorta. Mediastinum/Nodes: No mediastinal lymphadenopathy. There is no hilar lymphadenopathy. Mild circumferential wall thickening in the mid esophagus is similar to prior. There is no axillary lymphadenopathy. Lungs/Pleura: Centrilobular and paraseptal emphysema evident. Volume loss with scarring in the suprahilar right lung is similar to prior although there is some interval re-expansion of right upper lobe in the apex on the current study. No new suspicious pulmonary nodule or mass. No pleural effusion. Upper Abdomen: Visualized portion of the upper abdomen is unremarkable. 7 mm subcapsular hypoattenuating lesion in the posterior hepatic dome (image 132/2) is stable in the interval. While likely benign, continued attention on follow-up recommended. No adrenal nodule or mass. Musculoskeletal: No worrisome lytic or sclerotic osseous abnormality. IMPRESSION: 1. Stable exam. No new or progressive findings to suggest recurrent or metastatic disease in the chest. 2. Stable 7 mm subcapsular hypoattenuating lesion in the posterior hepatic dome. While likely benign, continued attention on follow-up recommended. 3. Mild circumferential wall thickening in the mid esophagus is similar to prior. Esophagitis  would be a consideration. 4. Aortic Atherosclerosis (ICD10-I70.0) and Emphysema (ICD10-J43.9). Electronically Signed   By: Kennith Center M.D.   On: 07/17/2022 15:05    ASSESSMENT AND PLAN: This is a very pleasant 67 years old white female recently diagnosed with a stage IIIA (TX, N2, M0) non-small cell lung cancer, squamous cell carcinoma presented with subcarinal mass/lymphadenopathy diagnosed in July 2022. The patient underwent a course of concurrent chemoradiation with weekly carboplatin for AUC of 2 and paclitaxel 45 Mg/M2 status post 7 cycles. The patient tolerated her treatment well except for fatigue and sore throat. The patient completed a course of consolidation treatment with immunotherapy with Imfinzi 1500 Mg IV every 4 weeks status post 13 cycles.   The patient has been in observation since that time and she is feeling fine with no concerning complaints. She had repeat CT scan of the chest performed recently.  I personally and independently reviewed the scan and discussed results with the patient today. PET scan showed no concerning findings for disease progression. I recommended for her to continue on observation with repeat CT scan of the chest in 4  months. For the anemia, she will continue with the oral iron tablets with vitamin C. For the hypothyroidism she is currently on levothyroxine 125 mcg p.o. daily. She was advised to call immediately if she has any other concerning symptoms in the interval. The patient voices understanding of current disease status and treatment options and is in agreement with the current care plan.  All questions were answered. The patient knows to call the clinic with any problems, questions or concerns. We can certainly see the patient much sooner if necessary.  Disclaimer: This note was dictated with voice recognition software. Similar sounding words can inadvertently be transcribed and may not be corrected upon review.

## 2022-07-27 ENCOUNTER — Encounter: Payer: Self-pay | Admitting: Medical

## 2022-07-27 ENCOUNTER — Telehealth (INDEPENDENT_AMBULATORY_CARE_PROVIDER_SITE_OTHER): Payer: Medicare Other | Admitting: Medical

## 2022-07-27 DIAGNOSIS — Z8619 Personal history of other infectious and parasitic diseases: Secondary | ICD-10-CM | POA: Insufficient documentation

## 2022-07-27 DIAGNOSIS — Z85118 Personal history of other malignant neoplasm of bronchus and lung: Secondary | ICD-10-CM | POA: Insufficient documentation

## 2022-07-27 DIAGNOSIS — J441 Chronic obstructive pulmonary disease with (acute) exacerbation: Secondary | ICD-10-CM

## 2022-07-27 DIAGNOSIS — J42 Unspecified chronic bronchitis: Secondary | ICD-10-CM

## 2022-07-27 DIAGNOSIS — E039 Hypothyroidism, unspecified: Secondary | ICD-10-CM | POA: Diagnosis not present

## 2022-07-27 MED ORDER — PREDNISONE 10 MG PO TABS: ORAL_TABLET | ORAL | 0 refills | Status: DC

## 2022-07-27 MED ORDER — DOXYCYCLINE HYCLATE 100 MG PO TABS: 100.0000 mg | ORAL_TABLET | Freq: Two times a day (BID) | ORAL | 0 refills | Status: DC

## 2022-07-27 NOTE — Progress Notes (Signed)
Subjective:     Patient ID: Katrina Campbell, female   DOB: 03-26-1956, 67 y.o.   MRN: [id number]  This visit type was conducted due to national recommendations for restrictions regarding the COVID-19 Pandemic (e.g. social distancing) in an effort to limit this patient's exposure and mitigate transmission in our community.  Due to their co-morbid illnesses, this patient is at least at moderate risk for complications without adequate follow up.  This format is felt to be most appropriate for this patient at this time.    Documentation for virtual audio and video telecommunications through [place] encounter:  The patient was located at home. The provider was located in the office. The patient did consent to this visit and is aware of possible charges through their insurance for this visit.  The other persons participating in this telemedicine service were none. Time spent on call was 20 minutes and in review of previous records 20 minutes total.  This virtual service is not related to other E/M service within previous 7 days.   HPI Chief Complaint  Patient presents with   Cough    Cough and congestion with dark sputum since Sunday   Virtual for cough and congestion.  Started 3 days ago with worsening symptoms.   Coughing up green sputum.  Feels a little worse SOB. Coughing hard to get stuff up, then out of breath for a little while.   Typically takes mucinex once daily but since these symtpoms bumped up to BID.Marland Kitchen  Uses trelegy daily, use neb albuterol BID in general.  Uses apflo chest vest for COPD BID in general.   Not currently on oxygen.  Has oximeter at home , o2 usually above 96%.  No fever, no hemopthysis or other respiratory syptoms.    Past Medical History:  Diagnosis Date   Anxiety    COPD (chronic obstructive pulmonary disease)    Coronary artery disease    Dental staining 07/15/2019   Depression    History of radiation therapy    Right lung- 11/03/20-12/15/20- Dr.  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     albuterol (PROVENTIL) (2.5 MG/3ML) 0.083% nebulizer solution INHALE 3 MLS (2.5 MG TOTAL) VIA NEBULIZER EVERY 6 HOURS AS NEEDED FOR WHEEZING OR SHORTNESS OF BREATH 540 mL 7   albuterol (VENTOLIN HFA) 108 (90 Base) MCG/ACT inhaler Inhale 2 puffs into the lungs every 6 (six) hours as needed for wheezing or shortness of breath.     aspirin EC 81 MG tablet Take 81 mg by mouth daily with lunch.      buPROPion (WELLBUTRIN XL) 300 MG 24 hr tablet Take 1 tablet (300 mg total) by mouth daily. 90 tablet 0   Cholecalciferol (DIALYVITE VITAMIN D 5000) 125 MCG (5000 UT) capsule Take 5,000 Units by mouth daily.     esomeprazole (NEXIUM) 40 MG capsule TAKE 1 CAPSULE DAILY 90 capsule 3   Fluticasone-Umeclidin-Vilant (TRELEGY ELLIPTA) 100-62.5-25 MCG/ACT AEPB USE 1 INHALATION DAILY 180 each 3   Guaifenesin (MUCINEX MAXIMUM STRENGTH) 1200 MG TB12 Take 1,200 mg by mouth 2 (two) times daily.     levothyroxine (SYNTHROID) 88 MCG tablet Take 1 tablet (88 mcg total) by  mouth daily. 30 tablet 1   Multiple Vitamins-Minerals (IMMUNE SUPPORT PO) Take 2 tablets by mouth daily.     Respiratory Therapy Supplies (FLUTTER) DEVI Use as directed 1 each 0   rosuvastatin (CRESTOR) 20 MG tablet Take 1 tablet (20 mg total) by mouth daily. 90 tablet 3   temazepam (RESTORIL) 15 MG capsule Take 1 capsule (15 mg total) by mouth at bedtime as needed. 90 capsule 0   vitamin B-12 (CYANOCOBALAMIN) 1000 MCG tablet Take 1,000 mcg by mouth daily.     Current Facility-Administered Medications on File Prior to Visit  Medication Dose Route Frequency Provider Last Rate Last Admin   Sonafine  emulsion 1 application  1 application  Topical Once Antony Blackbird, MD        Review of Systems As in subjective    Objective:   Physical Exam Due to coronavirus pandemic stay at home measures, patient visit was virtual and they were not examined in person.   There were no vitals taken for this visit.  Gen: wd, wn, nad A little labored in breathing      Assessment:     Encounter Diagnoses  Name Primary?   Chronic bronchitis, unspecified chronic bronchitis type Yes   COPD with acute exacerbation    History of MAC infection    Hypothyroidism, unspecified type    History of lung cancer        Plan:     We discussed her symptoms and concerns.  Begin doxycycline.  If much worse in the next day or 2 then add the prednisone as well.  Continue Trelegy daily which she takes routinely for COPD.  She also takes nebulized albuterol twice a day every day and uses chest therapy.  Increase nebulized albuterol this week for symptoms.  Continue to monitor oxygen levels.  If she is getting down below 93% and get reevaluated.  Hydrate well, rest  I reviewed her recent pulmonology notes from 05/2022  I reviewed her recent thyroid lab that was finally back to normal since she has finished with some of the cancer therapy.  She does see endocrinology for new consult in a few weeks regarding thyroid since we have had a hard time keeping her thyroid levels manageable   Katrina Campbell was seen today for cough.  Diagnoses and all orders for this visit:  Chronic bronchitis, unspecified chronic bronchitis type  COPD with acute exacerbation  History of MAC infection  Hypothyroidism, unspecified type  History of lung cancer  Other orders -     doxycycline (VIBRA-TABS) 100 MG tablet; Take 1 tablet (100 mg total) by mouth 2 (two) times daily. -     predniSONE (DELTASONE) 10 MG tablet; 6 tablets all together day 1, 5 tablets day 2, 4 tablets day 3, 3 tablets day 4, 2 tablets day 5, 1 tablet day  6.  F/u prn

## 2022-08-20 ENCOUNTER — Encounter: Payer: Self-pay | Admitting: Cardiology

## 2022-08-22 ENCOUNTER — Other Ambulatory Visit: Payer: Self-pay | Admitting: Medical

## 2022-08-22 ENCOUNTER — Other Ambulatory Visit: Payer: Self-pay

## 2022-08-22 MED ORDER — ROSUVASTATIN CALCIUM 20 MG PO TABS: 20.0000 mg | ORAL_TABLET | Freq: Every day | ORAL | 2 refills | Status: DC

## 2022-08-22 MED ORDER — TEMAZEPAM 15 MG PO CAPS: 15.0000 mg | ORAL_CAPSULE | Freq: Every evening | ORAL | 0 refills | Status: DC | PRN

## 2022-08-22 NOTE — Telephone Encounter (Signed)
From: [name] To: Office of Kristian Covey, [place] Sent: 08/22/2022 8:42 AM EDT Subject: Medication Renewal Request  Refills have been requested for the following medications:   temazepam (RESTORIL) 15 MG capsule [Shane Haylei Cobin]  Patient Comment: Need new prescription sent to [facility] thank you  Preferred pharmacy: [facility] - [place], [place] - [address] Delivery method: [employer]

## 2022-09-04 IMAGING — DX DG CHEST 1V PORT
1 series · 2 of 2 positions shown · non-contrast
Comparison: PET-CT 09/29/2020.  Chest x-ray 01/29/2019.

CLINICAL DATA: Congestive heart failure.

EXAM:
PORTABLE CHEST 1 VIEW

[Series 1: chest · 0.14mm/px · 2 of 2 slices shown]
[im 1/2]
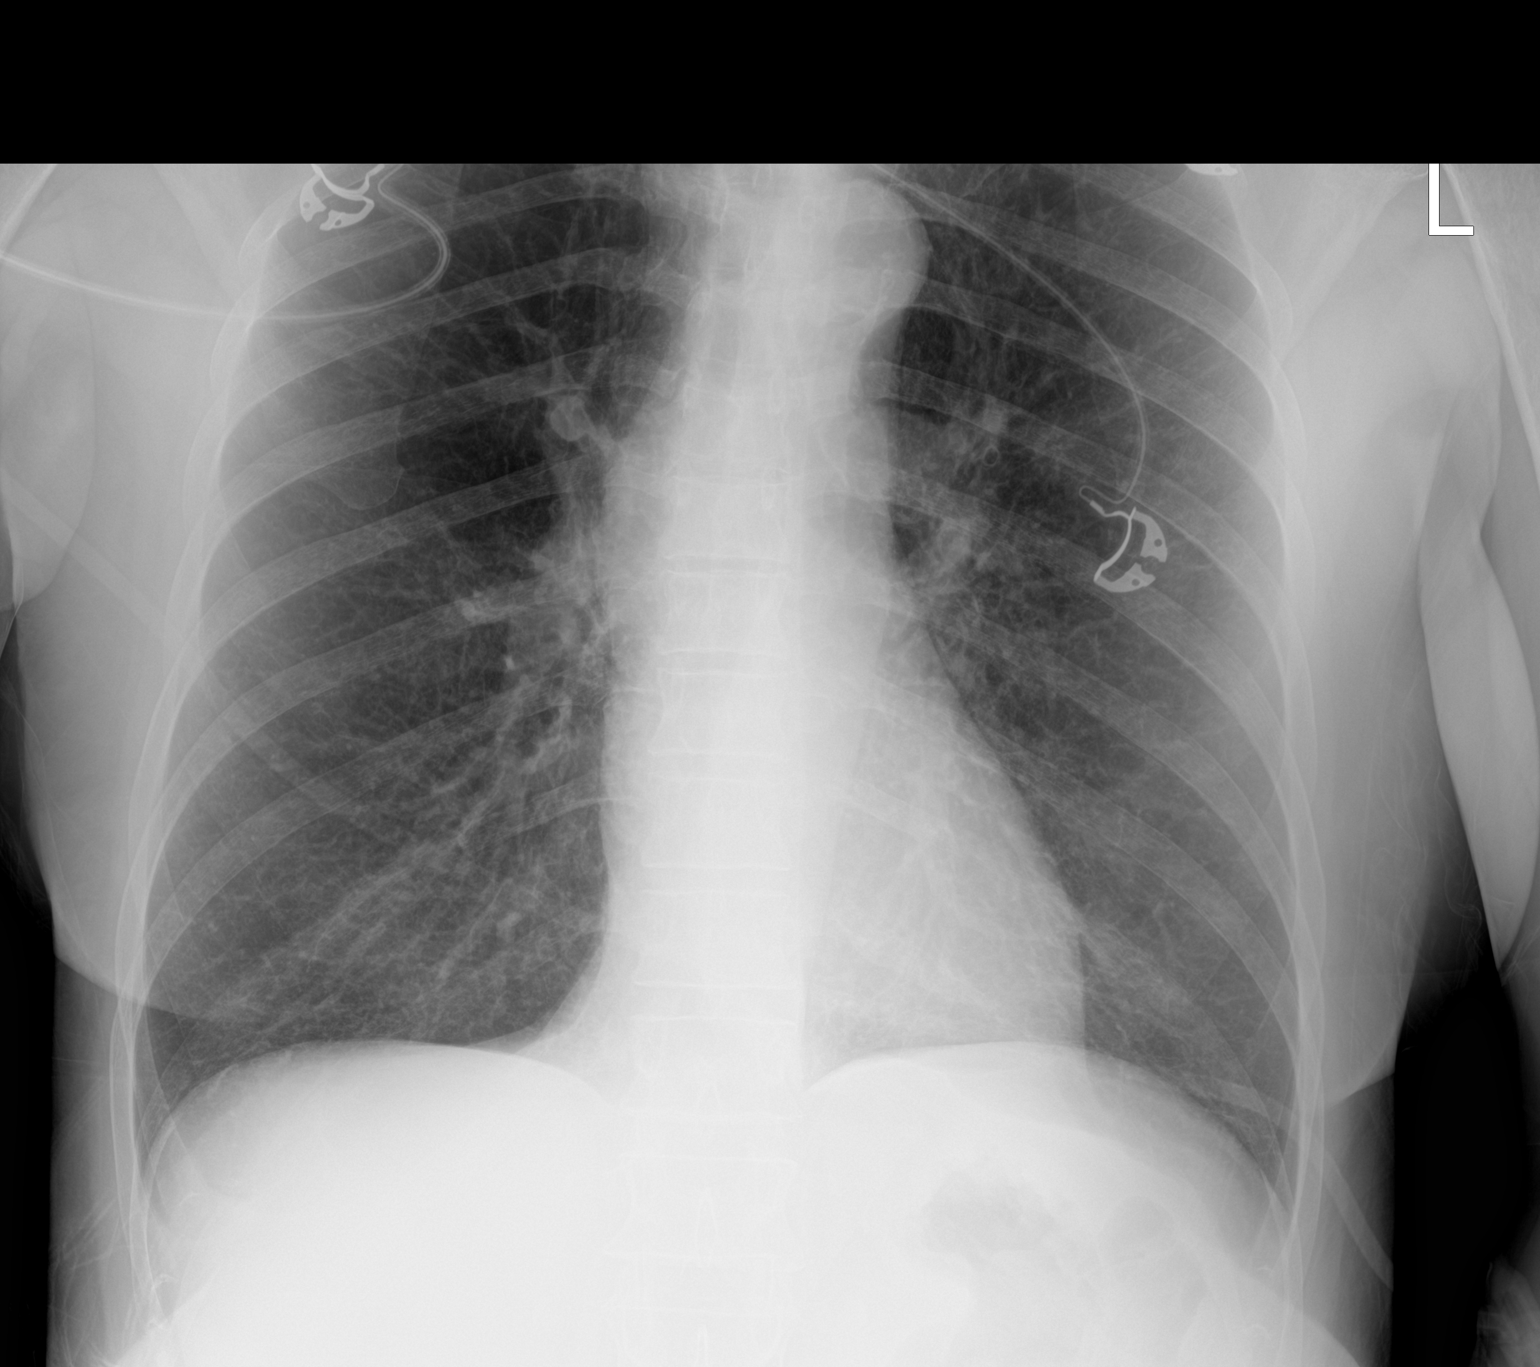
[im 2/2]
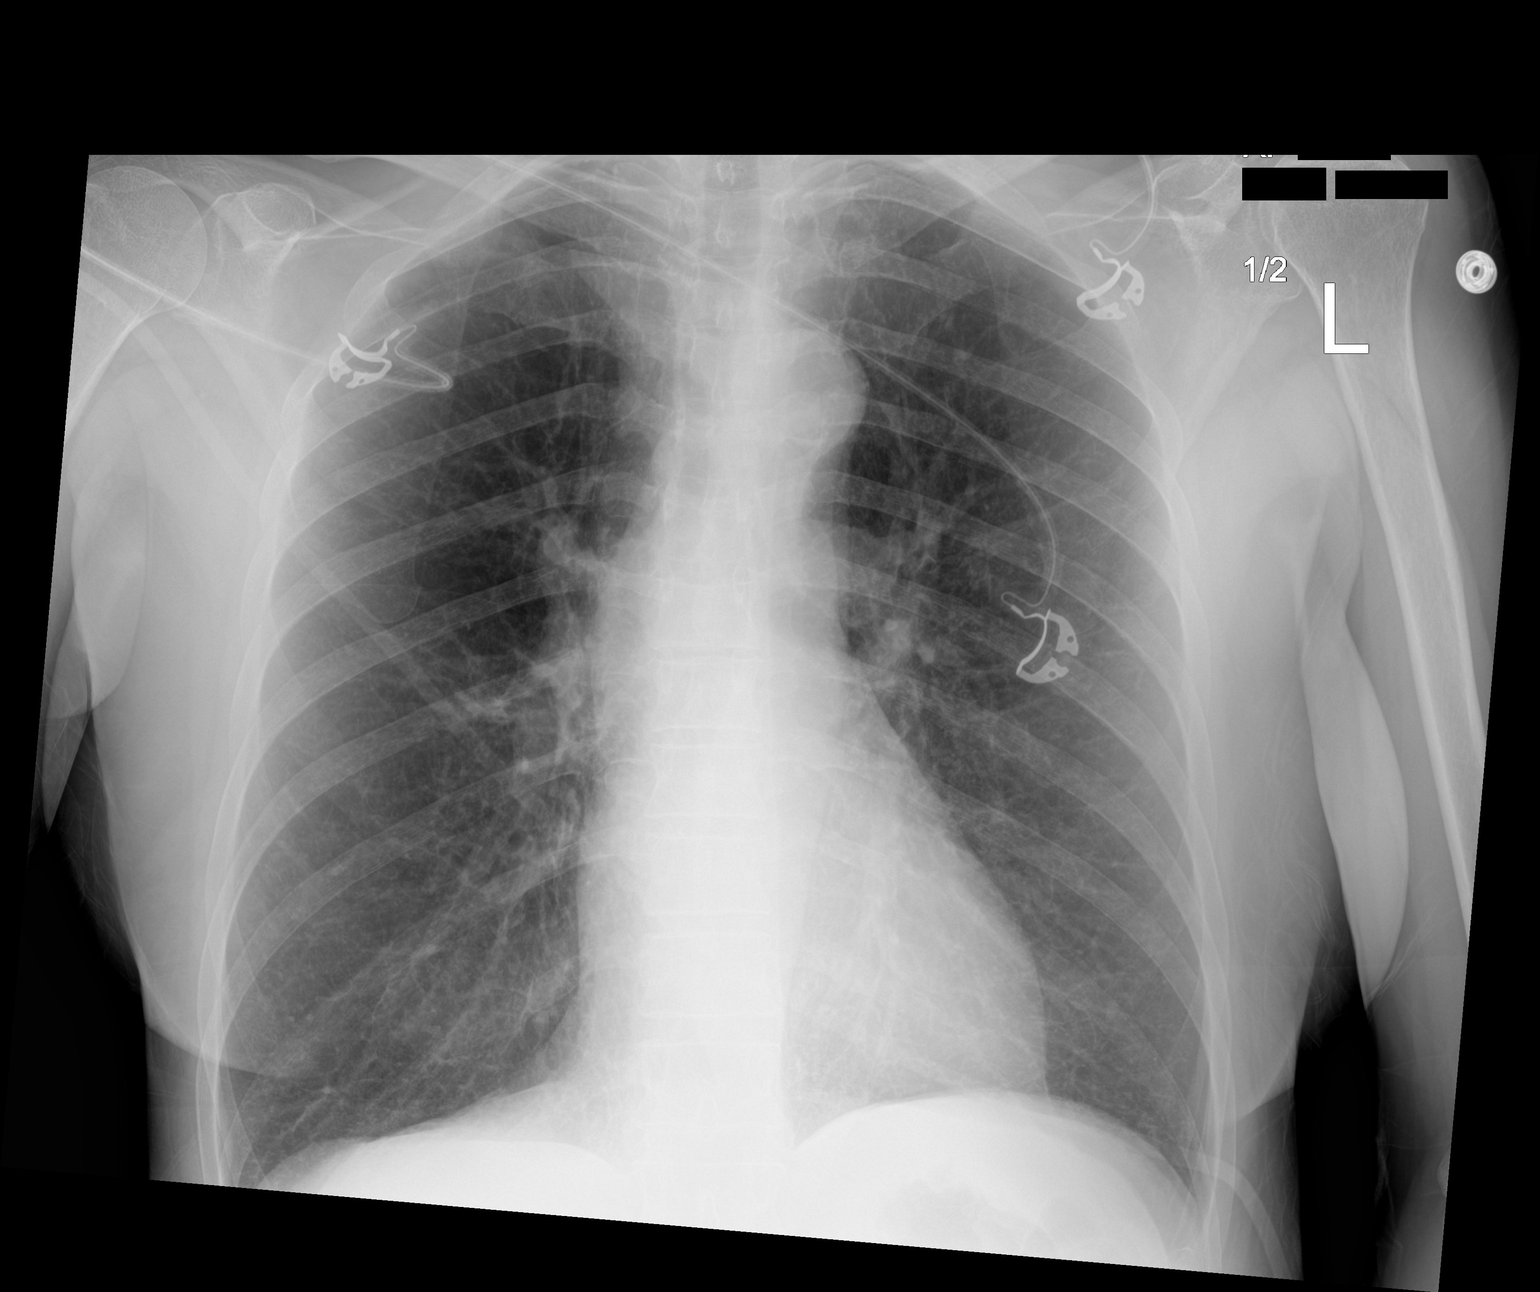

[2 of 2 positions shown; findings below may reference images not displayed]

FINDINGS: Mediastinum and hilar structures normal. Heart size normal. Normal
pulmonary vascularity. No focal infiltrate. No pleural effusion or
pneumothorax. Reference is made to recent PET-CT report of
09/29/2020.
IMPRESSION: 1.  No acute cardiopulmonary disease.  No evidence of CHF.

2.  Reference is made to recent PET-CT report of 09/29/2020.

## 2022-09-07 ENCOUNTER — Other Ambulatory Visit: Payer: Self-pay | Admitting: Internal Medicine

## 2022-09-07 MED ORDER — BUPROPION HCL ER (XL) 300 MG PO TB24: 300.0000 mg | ORAL_TABLET | Freq: Every day | ORAL | 1 refills | Status: DC

## 2022-09-07 NOTE — Telephone Encounter (Signed)
From: [name] To: Office of [employer], [place] Sent: 09/07/2022 10:06 AM EDT Subject: Medication Renewal Request  Refills have been requested for the following medications:   buPROPion (WELLBUTRIN XL) 300 MG 24 hr tablet [Shane Tysinger]  Preferred pharmacy: [facility] - [place], [place] - [address] Delivery method: [employer]

## 2022-09-18 ENCOUNTER — Other Ambulatory Visit: Payer: Self-pay | Admitting: Medical

## 2022-09-28 ENCOUNTER — Encounter: Payer: Self-pay | Admitting: Cardiology

## 2022-11-08 LAB — HM MAMMOGRAPHY

## 2022-11-10 ENCOUNTER — Inpatient Hospital Stay: Payer: Medicare Other | Attending: Internal Medicine

## 2022-11-10 ENCOUNTER — Other Ambulatory Visit: Payer: Self-pay

## 2022-11-10 ENCOUNTER — Encounter: Payer: Self-pay | Admitting: Internal Medicine

## 2022-11-10 DIAGNOSIS — Z85118 Personal history of other malignant neoplasm of bronchus and lung: Secondary | ICD-10-CM | POA: Diagnosis present

## 2022-11-10 DIAGNOSIS — D649 Anemia, unspecified: Secondary | ICD-10-CM | POA: Diagnosis not present

## 2022-11-10 DIAGNOSIS — Z9221 Personal history of antineoplastic chemotherapy: Secondary | ICD-10-CM | POA: Diagnosis not present

## 2022-11-10 DIAGNOSIS — E038 Other specified hypothyroidism: Secondary | ICD-10-CM

## 2022-11-10 DIAGNOSIS — E039 Hypothyroidism, unspecified: Secondary | ICD-10-CM | POA: Diagnosis not present

## 2022-11-10 DIAGNOSIS — C3490 Malignant neoplasm of unspecified part of unspecified bronchus or lung: Secondary | ICD-10-CM

## 2022-11-10 LAB — CBC WITH DIFFERENTIAL (CANCER CENTER ONLY)
Abs Immature Granulocytes: 0.02 10*3/uL (ref 0.00–0.07)
Basophils Absolute: 0 10*3/uL (ref 0.0–0.1)
Basophils Relative: 0 %
Eosinophils Absolute: 0.1 10*3/uL (ref 0.0–0.5)
Eosinophils Relative: 2 %
HCT: 37.8 % (ref 36.0–46.0)
Hemoglobin: 13.2 g/dL (ref 12.0–15.0)
Immature Granulocytes: 0 %
Lymphocytes Relative: 26 %
Lymphs Abs: 1.2 10*3/uL (ref 0.7–4.0)
MCH: 30.8 pg (ref 26.0–34.0)
MCHC: 34.9 g/dL (ref 30.0–36.0)
MCV: 88.3 fL (ref 80.0–100.0)
Monocytes Absolute: 0.3 10*3/uL (ref 0.1–1.0)
Monocytes Relative: 7 %
Neutro Abs: 3 10*3/uL (ref 1.7–7.7)
Neutrophils Relative %: 65 %
Platelet Count: 118 10*3/uL — ABNORMAL LOW (ref 150–400)
RBC: 4.28 MIL/uL (ref 3.87–5.11)
RDW: 13 % (ref 11.5–15.5)
WBC Count: 4.6 10*3/uL (ref 4.0–10.5)
nRBC: 0 % (ref 0.0–0.2)

## 2022-11-10 LAB — CMP (CANCER CENTER ONLY)
ALT: 29 U/L (ref 0–44)
AST: 24 U/L (ref 15–41)
Albumin: 4.2 g/dL (ref 3.5–5.0)
Alkaline Phosphatase: 77 U/L (ref 38–126)
Anion gap: 5 (ref 5–15)
BUN: 12 mg/dL (ref 8–23)
CO2: 27 mmol/L (ref 22–32)
Calcium: 8.9 mg/dL (ref 8.9–10.3)
Chloride: 107 mmol/L (ref 98–111)
Creatinine: 0.81 mg/dL (ref 0.44–1.00)
GFR, Estimated: >60 mL/min (ref >60)
Glucose, Bld: 86 mg/dL (ref 70–99)
Potassium: 4.1 mmol/L (ref 3.5–5.1)
Sodium: 139 mmol/L (ref 135–145)
Total Bilirubin: 0.4 mg/dL (ref 0.3–1.2)
Total Protein: 6.4 g/dL — ABNORMAL LOW (ref 6.5–8.1)

## 2022-11-10 LAB — TSH: TSH: 4.289 u[IU]/mL (ref 0.350–4.500)

## 2022-11-12 ENCOUNTER — Other Ambulatory Visit: Payer: Self-pay | Admitting: Medical

## 2022-11-14 ENCOUNTER — Encounter (HOSPITAL_COMMUNITY): Payer: Self-pay

## 2022-11-14 ENCOUNTER — Ambulatory Visit (HOSPITAL_COMMUNITY)
Admission: RE | Admit: 2022-11-14 | Discharge: 2022-11-14 | Disposition: A | Discharge status: Home / Self Care | Payer: Medicare Other | Source: Ambulatory Visit | Attending: Internal Medicine | Admitting: Internal Medicine

## 2022-11-14 DIAGNOSIS — C3490 Malignant neoplasm of unspecified part of unspecified bronchus or lung: Secondary | ICD-10-CM | POA: Diagnosis present

## 2022-11-14 MED ORDER — SODIUM CHLORIDE (PF) 0.9 % IJ SOLN
INTRAMUSCULAR | Status: AC
  Filled 2022-11-14: qty 50

## 2022-11-14 MED ORDER — IOHEXOL 300 MG/ML  SOLN
100.0000 mL | Freq: Once | INTRAMUSCULAR | Status: AC | PRN
  Administered 2022-11-14: 75 mL via INTRAVENOUS

## 2022-11-15 ENCOUNTER — Other Ambulatory Visit: Payer: Medicare Other

## 2022-11-15 ENCOUNTER — Ambulatory Visit (HOSPITAL_COMMUNITY): Payer: Medicare Other

## 2022-11-16 ENCOUNTER — Encounter: Payer: Self-pay | Admitting: Internal Medicine

## 2022-11-17 ENCOUNTER — Other Ambulatory Visit: Payer: Self-pay

## 2022-11-17 ENCOUNTER — Inpatient Hospital Stay (HOSPITAL_BASED_OUTPATIENT_CLINIC_OR_DEPARTMENT_OTHER): Payer: Medicare Other | Admitting: Internal Medicine

## 2022-11-17 VITALS — BP 148/86 | HR 89 | Temp 98.3°F | Resp 16 | Ht 64.0 in | Wt 137.3 lb

## 2022-11-17 DIAGNOSIS — E038 Other specified hypothyroidism: Secondary | ICD-10-CM | POA: Diagnosis not present

## 2022-11-17 DIAGNOSIS — Z85118 Personal history of other malignant neoplasm of bronchus and lung: Secondary | ICD-10-CM | POA: Diagnosis not present

## 2022-11-17 DIAGNOSIS — C3490 Malignant neoplasm of unspecified part of unspecified bronchus or lung: Secondary | ICD-10-CM | POA: Diagnosis not present

## 2022-11-17 NOTE — Progress Notes (Signed)
[facility] [phone]   [phone]  OFFICE PROGRESS NOTE  Jac Canavan, PA-C [address] [place] [address]  DIAGNOSIS: Stage IIIA (TX, N2, M0) non-small cell lung cancer, squamous cell carcinoma presented with subcarinal mass/lymphadenopathy diagnosed in July 2022.   PRIOR THERAPY:  1) Weekly carboplatin for AUC of 2 and paclitaxel 45 mg/m2.  First dose expected on 11/02/2020.  Status post 7 cycles.  Last dose was given 12/14/2020. 2) Consolidation treatment with immunotherapy with Imfinzi 1500 Mg IV every 4 weeks.  First dose 01/14/2021.  Status post 13 cycles   CURRENT THERAPY: Observation.  INTERVAL HISTORY: Katrina Campbell 67 y.o. female turns to the clinic today for 46-month follow-up visit.  The patient is feeling fine today with no concerning complaints except for chest congestion from allergy.  She denied having any current chest pain, shortness of breath, cough or hemoptysis.  She has no nausea, vomiting, diarrhea or constipation.  She has no headache or visual changes.  She denied having any recent weight loss or night sweats.  She denied having any fever or chills.  She is here today for evaluation with repeat CT scan of the chest for restaging of her disease.  MEDICAL HISTORY: Past Medical History:  Diagnosis Date   Anxiety    COPD (chronic obstructive pulmonary disease) (HCC)    Coronary artery disease    Dental staining 07/15/2019   Depression    History of radiation therapy    Right lung- 11/03/20-12/15/20- Dr. 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  MEDICATIONS:  Current Outpatient Medications  Medication Sig Dispense Refill   acetaminophen (TYLENOL) 500  MG tablet Take 500 mg by mouth every 6 (six) hours as needed for moderate pain or headache.     albuterol (PROVENTIL) (2.5 MG/3ML) 0.083% nebulizer solution INHALE 3 MLS (2.5 MG TOTAL) VIA NEBULIZER EVERY 6 HOURS AS NEEDED FOR WHEEZING OR SHORTNESS OF BREATH 540 mL 7   albuterol (VENTOLIN HFA) 108 (90 Base) MCG/ACT inhaler Inhale 2 puffs into the lungs every 6 (six) hours as needed for wheezing or shortness of breath.     aspirin EC 81 MG tablet Take 81 mg by mouth daily with lunch.      buPROPion (WELLBUTRIN XL) 300 MG 24 hr tablet Take 1 tablet (300 mg total) by mouth daily. 90 tablet 1   Cholecalciferol (DIALYVITE VITAMIN D 5000) 125 MCG (5000 UT) capsule Take 5,000 Units by mouth daily.     doxycycline (VIBRA-TABS) 100 MG tablet Take 1 tablet (100 mg total) by mouth 2 (two) times daily. 14 tablet 0   esomeprazole (NEXIUM) 40 MG capsule TAKE 1 CAPSULE DAILY 90 capsule 3   Fluticasone-Umeclidin-Vilant (TRELEGY ELLIPTA) 100-62.5-25 MCG/ACT AEPB USE 1 INHALATION DAILY 180 each 3   Guaifenesin (MUCINEX MAXIMUM STRENGTH) 1200 MG TB12 Take 1,200 mg by mouth 2 (two) times daily.     levothyroxine (SYNTHROID) 88 MCG tablet TAKE 1 TABLET(88 MCG) BY MOUTH DAILY 30 tablet 2   Multiple Vitamins-Minerals (IMMUNE SUPPORT PO) Take 2 tablets by mouth daily.     predniSONE (DELTASONE) 10 MG tablet 6 tablets all together day 1, 5 tablets day 2, 4 tablets day 3, 3 tablets day 4, 2 tablets day 5, 1  tablet day 6. 21 tablet 0   Respiratory Therapy Supplies (FLUTTER) DEVI Use as directed 1 each 0   rosuvastatin (CRESTOR) 20 MG tablet Take 1 tablet (20 mg total) by mouth daily. 90 tablet 2   temazepam (RESTORIL) 15 MG capsule TAKE 1 CAPSULE AT BEDTIME AS NEEDED 90 capsule 0   vitamin B-12 (CYANOCOBALAMIN) 1000 MCG tablet Take 1,000 mcg by mouth daily.     No current facility-administered medications for this visit.   Facility-Administered Medications Ordered in Other Visits  Medication Dose Route Frequency Provider  Last Rate Last Admin   Sonafine emulsion 1 application  1 application  Topical Once Antony Blackbird, MD        SURGICAL HISTORY:  Past Surgical History:  Procedure Laterality Date   BALLOON DILATION N/A 04/29/2021   Procedure: BALLOON DILATION;  Surgeon: Rachael Fee, MD;  Location: [facility];  Service: Endoscopy;  Laterality: N/A;   BIOPSY  01/14/2021   Procedure: BIOPSY;  Surgeon: Josephine Igo, DO;  Location: [facility];  Service: Pulmonary;;   BRONCHIAL BIOPSY  10/08/2020   Procedure: BRONCHIAL BIOPSIES;  Surgeon: Josephine Igo, DO;  Location: [facility];  Service: Pulmonary;;   BRONCHIAL BRUSHINGS  10/08/2020   Procedure: BRONCHIAL BRUSHINGS;  Surgeon: Josephine Igo, DO;  Location: [facility];  Service: Pulmonary;;   BRONCHIAL BRUSHINGS  01/14/2021   Procedure: BRONCHIAL BRUSHINGS;  Surgeon: Josephine Igo, DO;  Location: [facility];  Service: Pulmonary;;   BRONCHIAL WASHINGS  10/08/2020   Procedure: BRONCHIAL WASHINGS;  Surgeon: Josephine Igo, DO;  Location: [facility];  Service: Pulmonary;;   BRONCHIAL WASHINGS  01/14/2021   Procedure: BRONCHIAL WASHINGS;  Surgeon: Josephine Igo, DO;  Location: [facility];  Service: Pulmonary;;   ESOPHAGOGASTRODUODENOSCOPY (EGD) WITH PROPOFOL N/A 04/29/2021   Procedure: ESOPHAGOGASTRODUODENOSCOPY (EGD) WITH PROPOFOL;  Surgeon: Rachael Fee, MD;  Location: [facility];  Service: Endoscopy;  Laterality: N/A;   FINE NEEDLE ASPIRATION  10/08/2020   Procedure: FINE NEEDLE ASPIRATION (FNA) LINEAR;  Surgeon: Josephine Igo, DO;  Location: [facility];  Service: Pulmonary;;   LEFT HEART CATH AND CORONARY ANGIOGRAPHY N/A 11/07/2017   Procedure: LEFT HEART CATH AND CORONARY ANGIOGRAPHY;  Surgeon: Yvonne Kendall, MD;  Location: [facility];  Service: Cardiovascular;  Laterality: N/A;   NASAL SINUS SURGERY  1986   VIDEO BRONCHOSCOPY Bilateral 01/29/2019   Procedure: VIDEO BRONCHOSCOPY WITH FLUORO;  Surgeon: Chilton Greathouse, MD;  Location: [facility];  Service: Cardiopulmonary;  Laterality: Bilateral;   VIDEO BRONCHOSCOPY Right 01/14/2021   Procedure: VIDEO BRONCHOSCOPY WITHOUT FLUORO;  Surgeon: Josephine Igo, DO;  Location: [facility];  Service: Pulmonary;  Laterality: Right;  possible cryotherapy   VIDEO BRONCHOSCOPY WITH ENDOBRONCHIAL ULTRASOUND N/A 10/08/2020   Procedure: VIDEO BRONCHOSCOPY WITH ENDOBRONCHIAL ULTRASOUND;  Surgeon: Josephine Igo, DO;  Location: [facility];  Service: Pulmonary;  Laterality: N/A;    REVIEW OF SYSTEMS:  A comprehensive review of systems was negative except for: Respiratory: positive for cough   PHYSICAL EXAMINATION: General appearance: alert, cooperative, and no distress Head: Normocephalic, without obvious abnormality, atraumatic Neck: no adenopathy, no JVD, supple, symmetrical, trachea midline, and thyroid not enlarged, symmetric, no tenderness/mass/nodules Lymph nodes: Cervical, supraclavicular, and axillary nodes normal. Resp: clear to auscultation bilaterally Back: symmetric, no curvature. ROM normal. No CVA tenderness. Cardio: regular rate and rhythm, S1, S2 normal, no murmur, click, rub or gallop GI: soft, non-tender; bowel sounds normal; no masses,  no organomegaly Extremities: extremities normal, atraumatic,  no cyanosis or edema  ECOG PERFORMANCE STATUS: 1 - Symptomatic but completely ambulatory  Blood pressure (!) 148/86, pulse 89, temperature 98.3 F (36.8 C), temperature source Oral, resp. rate 16, height 5\' 4"  (1.626 m), weight 137 lb 4.8 oz (62.3 kg), SpO2 100%.  LABORATORY DATA: Lab Results  Component Value Date   WBC 4.6 11/10/2022   HGB 13.2 11/10/2022   HCT 37.8 11/10/2022   MCV 88.3 11/10/2022   PLT 118 (L) 11/10/2022      Chemistry      Component Value Date/Time   NA 139 11/10/2022 1037   NA 144 02/10/2020 0813   K 4.1 11/10/2022 1037   CL 107 11/10/2022 1037   CO2 27 11/10/2022 1037   BUN 12 11/10/2022 1037   BUN 10  02/10/2020 0813   CREATININE 0.81 11/10/2022 1037   CREATININE 0.96 07/22/2021 0936      Component Value Date/Time   CALCIUM 8.9 11/10/2022 1037   ALKPHOS 77 11/10/2022 1037   AST 24 11/10/2022 1037   ALT 29 11/10/2022 1037   BILITOT 0.4 11/10/2022 1037       RADIOGRAPHIC STUDIES: CT Chest W Contrast  Result Date: 11/17/2022 CLINICAL DATA:  Restaging non-small cell lung cancer. * Tracking Code: BO * EXAM: CT CHEST WITH CONTRAST TECHNIQUE: Multidetector CT imaging of the chest was performed during intravenous contrast administration. RADIATION DOSE REDUCTION: This exam was performed according to the departmental dose-optimization program which includes automated exposure control, adjustment of the mA and/or kV according to patient size and/or use of iterative reconstruction technique. CONTRAST:  75mL OMNIPAQUE IOHEXOL 300 MG/ML  SOLN COMPARISON:  07/14/2022 FINDINGS: Cardiovascular: The heart size appears within normal limits. No pericardial effusion. Aortic atherosclerosis and coronary artery calcification. Mediastinum/Nodes: No enlarged mediastinal, hilar, or axillary lymph nodes. Thyroid gland, trachea, and esophagus demonstrate no significant findings. Lungs/Pleura: Moderate changes of emphysema. No pleural effusion or acute airspace consolidation. No pneumothorax. Chronic complete atelectasis/consolidation of the right upper lobe. This appears similar to the study from 11/01/2021. There is no suspicious pulmonary nodule or mass identified within the aerated portions of the lungs. Upper Abdomen: No acute abnormality. Low-density structure along the dome of posterior right hepatic lobe is unchanged from 11/01/2021 measuring 1.0 x 0.5 cm, image 136/2. This is favored to represent a benign abnormality. The adrenal glands appear normal. Musculoskeletal: No chest wall abnormality. No acute or significant osseous findings. IMPRESSION: 1. No acute findings within the chest. No specific findings  identified to suggest recurrent tumor or metastatic disease. 2. Chronic complete atelectasis/consolidation of the right upper lobe. 3. Coronary artery calcification. 4. Aortic Atherosclerosis (ICD10-I70.0) and Emphysema (ICD10-J43.9). Electronically Signed   By: Signa Kell M.D.   On: 11/17/2022 08:15    ASSESSMENT AND PLAN: This is a very pleasant 67 years old white female recently diagnosed with a stage IIIA (TX, N2, M0) non-small cell lung cancer, squamous cell carcinoma presented with subcarinal mass/lymphadenopathy diagnosed in July 2022. The patient underwent a course of concurrent chemoradiation with weekly carboplatin for AUC of 2 and paclitaxel 45 Mg/M2 status post 7 cycles. The patient tolerated her treatment well except for fatigue and sore throat. The patient completed a course of consolidation treatment with immunotherapy with Imfinzi 1500 Mg IV every 4 weeks status post 13 cycles.   The patient is currently on observation and she is feeling fine with no concerning complaints. She had repeat CT scan of the chest performed recently.  I personally and independently reviewed the scan and  discussed the result with the patient today. Her scan showed no concerning finding for recurrent or metastatic disease. I recommended for her to continue on observation with repeat CT scan of the chest in 6 months. For the anemia, she will continue with the oral iron tablets with vitamin C. For the hypothyroidism she is currently on levothyroxine 125 mcg p.o. daily. The patient was advised to call immediately if she has any other concerning symptoms in the interval. The patient voices understanding of current disease status and treatment options and is in agreement with the current care plan.  All questions were answered. The patient knows to call the clinic with any problems, questions or concerns. We can certainly see the patient much sooner if necessary.  Disclaimer: This note was dictated with voice  recognition software. Similar sounding words can inadvertently be transcribed and may not be corrected upon review.

## 2022-11-22 ENCOUNTER — Encounter: Payer: Self-pay | Admitting: Medical

## 2022-11-22 ENCOUNTER — Ambulatory Visit (INDEPENDENT_AMBULATORY_CARE_PROVIDER_SITE_OTHER): Payer: Medicare Other | Admitting: Medical

## 2022-11-22 VITALS — BP 130/80 | HR 82 | Ht 64.0 in | Wt 138.4 lb

## 2022-11-22 DIAGNOSIS — E785 Hyperlipidemia, unspecified: Secondary | ICD-10-CM

## 2022-11-22 DIAGNOSIS — E039 Hypothyroidism, unspecified: Secondary | ICD-10-CM | POA: Diagnosis not present

## 2022-11-22 DIAGNOSIS — J449 Chronic obstructive pulmonary disease, unspecified: Secondary | ICD-10-CM | POA: Diagnosis not present

## 2022-11-22 DIAGNOSIS — F5104 Psychophysiologic insomnia: Secondary | ICD-10-CM

## 2022-11-22 DIAGNOSIS — Z7185 Encounter for immunization safety counseling: Secondary | ICD-10-CM

## 2022-11-22 DIAGNOSIS — F341 Dysthymic disorder: Secondary | ICD-10-CM | POA: Diagnosis not present

## 2022-11-22 DIAGNOSIS — I2510 Atherosclerotic heart disease of native coronary artery without angina pectoris: Secondary | ICD-10-CM

## 2022-11-22 DIAGNOSIS — Z85118 Personal history of other malignant neoplasm of bronchus and lung: Secondary | ICD-10-CM

## 2022-11-22 NOTE — Progress Notes (Addendum)
Subjective:  Katrina Campbell is a 67 y.o. female who presents for Chief Complaint  Patient presents with   Med check    Patient is here for med check. No refills needed at this time, she will call when she needs.     Here for med  check  Hypothyroidism - compliant with levothyroxine daily, no issues  Main concern today is sleep.  2 nights per week sleep is good, other days not good.  On temazepam, curious about other options.  She does watch news right before bedtime.  No journaling.  Not eating or drinking late in evening  Compliant with cholesterol medicaiton without issues  COPD - on inhalers, no recent illness or issues  Hx/o lung cancer - currently has completed treatment, on surveillance and monitoring for now. Just saw oncology last week  She is curious if she needs to stay on Wellbutrin.  No other aggravating or relieving factors.    No other c/o.  Past Medical History:  Diagnosis Date   Anxiety    COPD (chronic obstructive pulmonary disease) (HCC)    Coronary artery disease    Dental staining 07/15/2019   Depression    History of radiation therapy    Right lung- 11/03/20-12/15/20- Dr. 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     aspirin EC 81 MG tablet Take 81 mg by mouth daily with lunch.      buPROPion (WELLBUTRIN XL) 300 MG 24 hr tablet Take 1 tablet (300 mg total) by mouth daily. 90 tablet 1   Cholecalciferol (DIALYVITE VITAMIN D 5000)  125 MCG (5000 UT) capsule Take 5,000 Units by mouth daily.     esomeprazole (NEXIUM) 40 MG capsule TAKE 1 CAPSULE DAILY 90 capsule 3   ferrous sulfate 325 (65 FE) MG tablet Take 325 mg by mouth every other day.     Fluticasone-Umeclidin-Vilant (TRELEGY ELLIPTA) 100-62.5-25 MCG/ACT AEPB USE 1 INHALATION DAILY 180 each 3   Guaifenesin (MUCINEX MAXIMUM STRENGTH) 1200 MG TB12 Take 1,200 mg by mouth 2 (two) times daily.     levothyroxine (SYNTHROID) 88 MCG tablet TAKE 1 TABLET(88 MCG) BY MOUTH DAILY 30 tablet 2   Multiple Vitamins-Minerals (IMMUNE SUPPORT PO) Take 2 tablets by mouth daily.     Respiratory Therapy Supplies (FLUTTER) DEVI Use as directed 1 each 0   rosuvastatin (CRESTOR) 20 MG tablet Take 1 tablet (20 mg total) by mouth daily. 90 tablet 2   temazepam (RESTORIL) 15 MG capsule TAKE 1 CAPSULE AT BEDTIME AS NEEDED 90 capsule 0   vitamin B-12 (CYANOCOBALAMIN) 1000 MCG tablet Take 1,000 mcg by mouth daily.     acetaminophen (TYLENOL) 500 MG tablet Take 500 mg by mouth every 6 (six) hours as needed for moderate pain or headache. (Patient not taking: Reported on 11/22/2022)     Current Facility-Administered Medications on File Prior to Visit  Medication Dose Route Frequency Provider Last Rate Last Admin   Sonafine emulsion  1 application  1 application  Topical Once Antony Blackbird, MD         The following portions of the patient's history were reviewed and updated as appropriate: allergies, current medications, past family history, past medical history, past social history, past surgical history and problem list.  ROS Otherwise as in subjective above  Objective: BP 130/80   Pulse 82   Ht 5\' 4"  (1.626 m)   Wt 138 lb 6.4 oz (62.8 kg)   SpO2 95%   BMI 23.76 kg/m   General appearance: alert, no distress, well developed, well nourished Neck: supple, no lymphadenopathy, no thyromegaly, no masses Heart: RRR, normal S1, S2, 1/6 soft murmur in upper sternal borders Lungs: decreased lung  sounds in general, otherwise no wheezes, rhonchi, or rales Pulses: 2+ radial pulses, 2+ pedal pulses, normal cap refill Ext: no edema   Assessment: Encounter Diagnoses  Name Primary?   Chronic insomnia Yes   Dysthymia    Stage 2 moderate COPD by GOLD classification (HCC)    Hypothyroidism, unspecified type    Hyperlipidemia, unspecified hyperlipidemia type    History of lung cancer    Coronary artery disease involving native coronary artery of native heart without angina pectoris      Plan: Chronic insomnia - discussed symptoms, concerns, prior medications.  Currently on temazepam.   Discussed not watching news close to bedtime.  Try journaling.    Avoid stress where possible.   Gave option of trial of Trazodone . She wants to try to work on the journaling for now.   Consider counseling  Dysthymia - discussed Wellbutrin.  After discussing pros/cons, she wants to continue this for now.    COPD - sees pulmonology, on therapies, no recent worse issues.  She continues on Trelegy daily, albuterol prn, mucinex prn.    Hypothyroidism - recent labs ok 11/17/22, continue levothyroxine daily.   Hyperlipidemia - last labs 06/2022 reviewed.  Continue Crestor 20mg  daily.  Hx/o lung cancer - reviewed recent oncology notes from last week.  Currently on surveillance and follow CT chest q 30mo.    From oncology notes last week,  diagnosed with a stage IIIA (TX, N2, M0) non-small cell lung cancer, squamous cell carcinoma presented with subcarinal mass/lymphadenopathy diagnosed in July 2022. The patient underwent a course of concurrent chemoradiation with weekly carboplatin for AUC of 2 and paclitaxel 45 Mg/M2 status post 7 cycles. The patient tolerated her treatment well except for fatigue and sore throat. The patient completed a course of consolidation treatment with immunotherapy with Imfinzi 1500 Mg IV every 4 weeks status post 13 cycles.   The patient is currently on observation and she is  feeling fine with no concerning complaints.   CAD - continue aspirin 81mg  daily, statin daily  Vaccine counseling Advised tdap and shingrix at pharmacy Consider updated flu and pneumococcal   She declines today but will consider   Katrina Campbell was seen today for med check.  Diagnoses and all orders for this visit:  Chronic insomnia  Dysthymia  Stage 2 moderate COPD by GOLD classification (HCC)  Hypothyroidism, unspecified type  Hyperlipidemia, unspecified hyperlipidemia type  History of lung cancer  Coronary artery disease involving native coronary artery of native heart without angina pectoris    Follow up: yearly for well visit

## 2022-12-09 ENCOUNTER — Encounter: Payer: Self-pay | Admitting: Pulmonary Disease

## 2022-12-09 ENCOUNTER — Ambulatory Visit (INDEPENDENT_AMBULATORY_CARE_PROVIDER_SITE_OTHER): Payer: Medicare Other | Admitting: Pulmonary Disease

## 2022-12-09 VITALS — BP 130/90 | HR 86 | Ht 64.0 in | Wt 138.4 lb

## 2022-12-09 DIAGNOSIS — C3491 Malignant neoplasm of unspecified part of right bronchus or lung: Secondary | ICD-10-CM

## 2022-12-09 DIAGNOSIS — J449 Chronic obstructive pulmonary disease, unspecified: Secondary | ICD-10-CM | POA: Diagnosis not present

## 2022-12-09 DIAGNOSIS — A310 Pulmonary mycobacterial infection: Secondary | ICD-10-CM

## 2022-12-09 NOTE — Patient Instructions (Signed)
Thank you for visiting Dr. Tonia Brooms at [facility]. Today we recommend the following:  Continue follow up CT imaging  Continue trelegy   Return in about 6 months (around 06/08/2023) for with Dr. Tonia Brooms.    Please do your part to reduce the spread of COVID-19.

## 2022-12-09 NOTE — Progress Notes (Unsigned)
Synopsis: Referred in January 2021 for former smoker quit 2014 moderate COPD, MAI by Jac Canavan, PA-C  Subjective:   PATIENT ID: Katrina Campbell GENDER: female DOB: 1956-03-22, MRN: [id number]  Chief Complaint  Patient presents with   Follow-up    6 months f/up    This is a 67 year old female moderate COPD, former smoker quit 2014, MAI patient last seen in the office by Dr. Isaiah Serge.  Former Dr. Kriste Basque patient.  Treated for stenotrophomonas in the past.  Prior 3 sputum's AFB, 1/3+ for MAI.  Saw infectious disease to discuss initiation of therapy.  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  Patient was last seen by infectious disease on 04/15/2019.  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  Just started on azithromycin 500 mg daily, rifampin 600 mg daily plus ethambutol 900 mg daily.  Patient does have CT imaging with lower lobe bronchiectasis.  Patient has chronic sputum production.  She is short of breath with daily cough and sputum production.  During this time she is very anxious and hesitant about going out in public due to her chronic cough.  She denies hemoptysis patient's weight has also been stable.  OV 11/06/2019: around the 4th of July patient had congestion and cough that still hasnt really gone away.  Overall doing much better now.  Has less cough and congestion.  Recent follow-up with infectious disease plan for Monday.  Been compliant with her medications.  Still has some daily sputum production.  Rarely has a streak of blood with cough.  OV 04/14/2020: Here today for follow-up.  Establish care with me back last year.  Was already under treatment for her MAI.  Followed up with infectious disease here in [place] as well as met with ID at [facility].  Decision was made to come off of treatments.  Recent sputum's have been negative.  She  does have significant emphysema currently compliant with inhaler regimen.  From a respiratory standpoint she is doing fine.  No significant symptom change after stopping antibiotics.   OV 10/01/2020: Here today for follow-up had abnormal lung cancer screening CT follow-up with an enlarged mediastinal node.  Patient was sent for nuclear medicine PET scan.  PET scan revealed a significantly PET avid station 7 subcarinal node with SUV of 11.  We reviewed this today in the office.  Concerning for underlying malignancy.  OV 10/09/2020: Here today for follow-up.  Recent CT scan of the chest had enlarged mediastinal node.  PET scan with PET avid station 7 and irregular lining of the right mainstem.  Patient was taken for video bronchoscopy on 10/08/2020.  Bronchoscopy revealed enlarged subcarinal adenopathy which was sampled under ultrasound guidance.  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  Endobronchial brushings, forcep biopsies of the abnormal mucosa and visible tumor were taken.  OV 02/10/2021: having difficulty breathing, shes is more short of breath.  She was taken for bronchoscopy recently with repeat biopsy which did show persistence of her malignancy.  Her culture results also were positive again for MAI.  I did reach out to infectious disease today for her symptoms should consider treatment options again.  Especially in lieu of of her immune suppression receiving intermittent therapy.  OV 03/03/2021: having lots of difficulty sob. Also she feels like food sometimes gets stuck with  swallowing.  Overall feels anxious that her shortness of breath is not getting much better.  Saw infectious disease in, and once again and agreed to continue to hold therapy for MAI.  I agree she does not have a lot of radiographic evidence of MAI.  She still has significant amount of sputum production.  She has not been using her vest  therapy too much because it worsens back pain.  Trying to use her flutter valve.  OV 04/23/2021: Here today for follow up.  Recently had a chest x-ray with right upper lobe collapse persistent.  She ultimately end up going to the ER having an episode of feeling choked and ultimately what sounds like she passed a mucous plug.  Repeat imaging shows reinflation of the right upper lobe.  She feels much better after all this happened.  Started immunotherapy and has been tolerating that well with medical oncology.  Encouraged her to continue to use her breathing treatments and flutter valve to help with airway clearance techniques.  OV 08/02/2021: Follow-up for COPD and recurrent right upper lobe atelectasis.  She had a recent CT completed by infectious disease that shows persistent atelectasis in the right upper lobe.  It is consistent with her previous x-rays.  She still doing her nebulized treatments and vest therapy twice daily with as needed flutter valve.  She has very little cough and sputum production.  Not been very successful in producing sputum for cultures.  She follows up with infectious disease in a few weeks.  Not on any treatments at this time for MAI.  OV 05/05/2022: Here today for COPD follow-up, chronic right upper lobe atelectasis, known MAI.  She is on treatment for short period ultimately stopped.  She is doing airway clearance techniques vest therapy flutter valve.  She does have daily sputum production.  She is managed with Trelegy for her COPD and as needed albuterol.  From respiratory standpoint she has been doing very well with no recent exacerbations.  She had a cancer CT follow-up in January 2024 which reveals no evidence of recurrence of disease.  OV 12/10/2022: Here today for follow-up for management of COPD.  She also has MAI.  Using her regular airway clearance techniques.  Anxious about her follow-up CT imaging after treatments for her lung cancer.  She wants me to review those with her  today.  They have been reviewed with her Dr. Shirline Frees from oncology.  I agree it was very reassuring with no evidence of recurrence of disease.  We talked about this today in the office.    Past Medical History:  Diagnosis Date   Anxiety    COPD (chronic obstructive pulmonary disease) (HCC)    Coronary artery disease    Dental staining 07/15/2019   Depression    History of radiation therapy    Right lung- 11/03/20-12/15/20- Dr. Antony Blackbird   Hyperlipidemia    Labile hypertension    Myocardial infarction [facility]) 2018   Pneumonia    Pulmonary mycobacterial infection (HCC) 10/24/2018     Family History  Problem Relation Age of Onset   Heart attack Mother    Emphysema Mother    Congestive Heart Failure Mother    Heart attack Father    Congestive Heart Failure Father    Asthma Other    Lung disease Sister    Lung disease Brother    Colon cancer Neg Hx      Past Surgical History:  Procedure Laterality Date   BALLOON DILATION N/A  04/29/2021   Procedure: BALLOON DILATION;  Surgeon: Rachael Fee, MD;  Location: [facility];  Service: Endoscopy;  Laterality: N/A;   BIOPSY  01/14/2021   Procedure: BIOPSY;  Surgeon: Josephine Igo, DO;  Location: [facility];  Service: Pulmonary;;   BRONCHIAL BIOPSY  10/08/2020   Procedure: BRONCHIAL BIOPSIES;  Surgeon: Josephine Igo, DO;  Location: [facility];  Service: Pulmonary;;   BRONCHIAL BRUSHINGS  10/08/2020   Procedure: BRONCHIAL BRUSHINGS;  Surgeon: Josephine Igo, DO;  Location: [facility];  Service: Pulmonary;;   BRONCHIAL BRUSHINGS  01/14/2021   Procedure: BRONCHIAL BRUSHINGS;  Surgeon: Josephine Igo, DO;  Location: [facility];  Service: Pulmonary;;   BRONCHIAL WASHINGS  10/08/2020   Procedure: BRONCHIAL WASHINGS;  Surgeon: Josephine Igo, DO;  Location: [facility];  Service: Pulmonary;;   BRONCHIAL WASHINGS  01/14/2021   Procedure: BRONCHIAL WASHINGS;  Surgeon: Josephine Igo, DO;  Location: [facility];   Service: Pulmonary;;   ESOPHAGOGASTRODUODENOSCOPY (EGD) WITH PROPOFOL N/A 04/29/2021   Procedure: ESOPHAGOGASTRODUODENOSCOPY (EGD) WITH PROPOFOL;  Surgeon: Rachael Fee, MD;  Location: [facility];  Service: Endoscopy;  Laterality: N/A;   FINE NEEDLE ASPIRATION  10/08/2020   Procedure: FINE NEEDLE ASPIRATION (FNA) LINEAR;  Surgeon: Josephine Igo, DO;  Location: [facility];  Service: Pulmonary;;   LEFT HEART CATH AND CORONARY ANGIOGRAPHY N/A 11/07/2017   Procedure: LEFT HEART CATH AND CORONARY ANGIOGRAPHY;  Surgeon: Yvonne Kendall, MD;  Location: [facility];  Service: Cardiovascular;  Laterality: N/A;   NASAL SINUS SURGERY  1986   VIDEO BRONCHOSCOPY Bilateral 01/29/2019   Procedure: VIDEO BRONCHOSCOPY WITH FLUORO;  Surgeon: Chilton Greathouse, MD;  Location: [facility];  Service: Cardiopulmonary;  Laterality: Bilateral;   VIDEO BRONCHOSCOPY Right 01/14/2021   Procedure: VIDEO BRONCHOSCOPY WITHOUT FLUORO;  Surgeon: Josephine Igo, DO;  Location: [facility];  Service: Pulmonary;  Laterality: Right;  possible cryotherapy   VIDEO BRONCHOSCOPY WITH ENDOBRONCHIAL ULTRASOUND N/A 10/08/2020   Procedure: VIDEO BRONCHOSCOPY WITH ENDOBRONCHIAL ULTRASOUND;  Surgeon: Josephine Igo, DO;  Location: [facility];  Service: Pulmonary;  Laterality: N/A;    Social History   Socioeconomic History   Marital status: Married    Spouse name: Not on file   Number of children: Not on file   Years of education: Not on file   Highest education level: Not on file  Occupational History   Not on file  Tobacco Use   Smoking status: Former    Current packs/day: 0.00    Average packs/day: 1.8 packs/day for 44.0 years (77.0 ttl pk-yrs)    Types: Cigarettes    Start date: 11/09/1968    Quit date: 11/09/2012    Years since quitting: 10.0   Smokeless tobacco: Never   Tobacco comments:    vapor  Vaping Use   Vaping status: Former  Substance and Sexual Activity   Alcohol use: Not Currently     Alcohol/week: 24.0 standard drinks of alcohol    Types: 24 Cans of beer per week   Drug use: Not Currently   Sexual activity: Not Currently  Other Topics Concern   Not on file  Social History Narrative   Not on file   Social Determinants of Health   Financial Resource Strain: Medium Risk (10/29/2020)   Overall Financial Resource Strain (CARDIA)    Difficulty of Paying Living Expenses: Somewhat hard  Food Insecurity: No Food Insecurity (11/22/2022)   Hunger Vital Sign    Worried About Running Out of Food in  the Last Year: Never true    Ran Out of Food in the Last Year: Never true  Transportation Needs: No Transportation Needs (11/22/2022)   PRAPARE - Administrator, Civil Service (Medical): No    Lack of Transportation (Non-Medical): No  Physical Activity: Sufficiently Active (11/22/2022)   Exercise Vital Sign    Days of Exercise per Week: 7 days    Minutes of Exercise per Session: 30 min  Stress: Stress Concern Present (11/22/2022)   [employer] of Occupational Health - Occupational Stress Questionnaire    Feeling of Stress : To some extent  Social Connections: Moderately Integrated (11/22/2022)   Social Connection and Isolation Panel [NHANES]    Frequency of Communication with Friends and Family: More than three times a week    Frequency of Social Gatherings with Friends and Family: Once a week    Attends Religious Services: More than 4 times per year    Active Member of [employer] or Organizations: No    Attends Banker Meetings: Never    Marital Status: Living with partner  Intimate Partner Violence: Not At Risk (11/22/2022)   Humiliation, Afraid, Rape, and Kick questionnaire    Fear of Current or Ex-Partner: No    Emotionally Abused: No    Physically Abused: No    Sexually Abused: No     Allergies  Allergen Reactions   Breztri Aerosphere [Budeson-Glycopyrrol-Formoterol] Shortness Of Breath   Benadryl [Diphenhydramine] Nausea Only   Codeine Other  (See Comments)    Causes pt to blackout   Prednisone Other (See Comments)    Passed out   Statins     Myalgias at higher doses      Outpatient Medications Prior to Visit  Medication Sig Dispense Refill   acetaminophen (TYLENOL) 500 MG tablet Take 500 mg by mouth every 6 (six) hours as needed for moderate pain or headache.     albuterol (PROVENTIL) (2.5 MG/3ML) 0.083% nebulizer solution INHALE 3 MLS (2.5 MG TOTAL) VIA NEBULIZER EVERY 6 HOURS AS NEEDED FOR WHEEZING OR SHORTNESS OF BREATH 540 mL 7   albuterol (VENTOLIN HFA) 108 (90 Base) MCG/ACT inhaler Inhale 2 puffs into the lungs every 6 (six) hours as needed for wheezing or shortness of breath.     aspirin EC 81 MG tablet Take 81 mg by mouth daily with lunch.      buPROPion (WELLBUTRIN XL) 300 MG 24 hr tablet Take 1 tablet (300 mg total) by mouth daily. 90 tablet 1   Cholecalciferol (DIALYVITE VITAMIN D 5000) 125 MCG (5000 UT) capsule Take 5,000 Units by mouth daily.     esomeprazole (NEXIUM) 40 MG capsule TAKE 1 CAPSULE DAILY 90 capsule 3   ferrous sulfate 325 (65 FE) MG tablet Take 325 mg by mouth every other day.     Fluticasone-Umeclidin-Vilant (TRELEGY ELLIPTA) 100-62.5-25 MCG/ACT AEPB USE 1 INHALATION DAILY 180 each 3   Guaifenesin (MUCINEX MAXIMUM STRENGTH) 1200 MG TB12 Take 1,200 mg by mouth 2 (two) times daily.     levothyroxine (SYNTHROID) 88 MCG tablet TAKE 1 TABLET(88 MCG) BY MOUTH DAILY 30 tablet 2   Multiple Vitamins-Minerals (IMMUNE SUPPORT PO) Take 2 tablets by mouth daily.     Respiratory Therapy Supplies (FLUTTER) DEVI Use as directed 1 each 0   rosuvastatin (CRESTOR) 20 MG tablet Take 1 tablet (20 mg total) by mouth daily. 90 tablet 2   temazepam (RESTORIL) 15 MG capsule TAKE 1 CAPSULE AT BEDTIME AS NEEDED 90 capsule 0  vitamin B-12 (CYANOCOBALAMIN) 1000 MCG tablet Take 1,000 mcg by mouth daily.     Facility-Administered Medications Prior to Visit  Medication Dose Route Frequency Provider Last Rate Last Admin    Sonafine emulsion 1 application  1 application  Topical Once Antony Blackbird, MD        Review of Systems  Constitutional:  Negative for chills, fever, malaise/fatigue and weight loss.  HENT:  Negative for hearing loss, sore throat and tinnitus.   Eyes:  Negative for blurred vision and double vision.  Respiratory:  Positive for cough. Negative for hemoptysis, sputum production, shortness of breath, wheezing and stridor.   Cardiovascular:  Negative for chest pain, palpitations, orthopnea, leg swelling and PND.  Gastrointestinal:  Negative for abdominal pain, constipation, diarrhea, heartburn, nausea and vomiting.  Genitourinary:  Negative for dysuria, hematuria and urgency.  Musculoskeletal:  Negative for joint pain and myalgias.  Skin:  Negative for itching and rash.  Neurological:  Negative for dizziness, tingling, weakness and headaches.  Endo/Heme/Allergies:  Negative for environmental allergies. Does not bruise/bleed easily.  Psychiatric/Behavioral:  Negative for depression. The patient is not nervous/anxious and does not have insomnia.   All other systems reviewed and are negative.    Objective:  Physical Exam Vitals reviewed.  Constitutional:      General: She is not in acute distress.    Appearance: She is well-developed.  HENT:     Head: Normocephalic and atraumatic.  Eyes:     General: No scleral icterus.    Conjunctiva/sclera: Conjunctivae normal.     Pupils: Pupils are equal, round, and reactive to light.  Neck:     Vascular: No JVD.     Trachea: No tracheal deviation.  Cardiovascular:     Rate and Rhythm: Normal rate and regular rhythm.     Heart sounds: Normal heart sounds. No murmur heard. Pulmonary:     Effort: Pulmonary effort is normal. No tachypnea, accessory muscle usage or respiratory distress.     Breath sounds: No stridor. No wheezing, rhonchi or rales.     Comments: Diminished breath sounds Abdominal:     General: There is no distension.      Palpations: Abdomen is soft.     Tenderness: There is no abdominal tenderness.  Musculoskeletal:        General: No tenderness.     Cervical back: Neck supple.  Lymphadenopathy:     Cervical: No cervical adenopathy.  Skin:    General: Skin is warm and dry.     Capillary Refill: Capillary refill takes less than 2 seconds.     Findings: No rash.  Neurological:     Mental Status: She is alert and oriented to person, place, and time.  Psychiatric:        Behavior: Behavior normal.      Vitals:   12/09/22 1513  BP: (!) 130/90  Pulse: 86  SpO2: 98%  Weight: 138 lb 6.4 oz (62.8 kg)  Height: 5\' 4"  (1.626 m)    98% on RA BMI Readings from Last 3 Encounters:  12/09/22 23.76 kg/m  11/22/22 23.76 kg/m  11/17/22 23.57 kg/m   Wt Readings from Last 3 Encounters:  12/09/22 138 lb 6.4 oz (62.8 kg)  11/22/22 138 lb 6.4 oz (62.8 kg)  11/17/22 137 lb 4.8 oz (62.3 kg)     CBC    Component Value Date/Time   WBC 4.6 11/10/2022 1037   WBC 5.2 03/14/2021 1412   RBC 4.28 11/10/2022 1037   HGB 13.2  11/10/2022 1037   HGB 13.7 09/22/2020 1325   HCT 37.8 11/10/2022 1037   HCT 41.2 09/22/2020 1325   PLT 118 (L) 11/10/2022 1037   PLT 143 (L) 09/22/2020 1325   MCV 88.3 11/10/2022 1037   MCV 89 09/22/2020 1325   MCH 30.8 11/10/2022 1037   MCHC 34.9 11/10/2022 1037   RDW 13.0 11/10/2022 1037   RDW 12.9 09/22/2020 1325   LYMPHSABS 1.2 11/10/2022 1037   LYMPHSABS 2.1 09/22/2020 1325   MONOABS 0.3 11/10/2022 1037   EOSABS 0.1 11/10/2022 1037   EOSABS 0.1 09/22/2020 1325   BASOSABS 0.0 11/10/2022 1037   BASOSABS 0.0 09/22/2020 1325    Chest Imaging:  May 2020 CT chest: Scattered nodules.  Lower lobe small areas of bronchiectasis. The patient's images have been independently reviewed by me.   PET scan 09/29/2020: SUV of 11, subcarinal adenopathy concerning for malignancy. The patient's images have been independently reviewed by me.    Chest x-ray 02/09/2021 CT chest  02/19/2021: Right upper lobe collapse.  Which is new from previous chest x-ray imaging.  CT chest 03/14/2021: Resolution of the right upper lobe collapse, mucous and tracheal debris. The patient's images have been independently reviewed by me.    07/31/2021: CT chest: Right upper lobe atelectasis, few scattered small pulmonary nodules.  She is seeing ID back soon.  CT was recommending a repeat CT scan follow-up in 4 to 6 weeks.  She has had this upper lobe collapse in the past. The patient's images have been independently reviewed by me.    04/15/2022 CT chest: Stable posttreatment changes within the right upper lobe volume loss in the hilum.  No obvious mass or lymph node involvement. The patient's images have been independently reviewed by me.    Pulmonary Functions Testing Results:     No data to display          FeNO: none   Pathology: none   Echocardiogram: none  Heart Catheterization: none     Assessment & Plan:     ICD-10-CM   1. NSCLC of right lung (HCC)  C34.91     2. Stage 2 moderate COPD by GOLD classification (HCC)  J44.9     3. MAI (mycobacterium avium-intracellulare) (HCC)  A31.0     4. 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  She is undergoing treatment for chemotherapy plus radiation as well as immunotherapy.   P: Continue airway clearance techniques plus vest therapy Monitor off of antibiotics Continue nebulized albuterol as needed Continue Trelegy We reviewed her CT imaging for follow-up today that shows no evidence of recurrence of disease. Continue surveillance imaging with oncology.     Current Outpatient Medications:    acetaminophen (TYLENOL)  500 MG tablet, Take 500 mg by mouth every 6 (six) hours as needed for moderate pain or headache., Disp: , Rfl:    albuterol (PROVENTIL) (2.5 MG/3ML) 0.083% nebulizer solution, INHALE 3 MLS (2.5 MG TOTAL) VIA NEBULIZER EVERY 6 HOURS AS NEEDED FOR WHEEZING OR SHORTNESS OF BREATH, Disp: 540 mL, Rfl: 7   albuterol (VENTOLIN HFA) 108 (90 Base) MCG/ACT inhaler, Inhale 2 puffs into the lungs every 6 (six) hours as needed for wheezing or shortness of  breath., Disp: , Rfl:    aspirin EC 81 MG tablet, Take 81 mg by mouth daily with lunch. , Disp: , Rfl:    buPROPion (WELLBUTRIN XL) 300 MG 24 hr tablet, Take 1 tablet (300 mg total) by mouth daily., Disp: 90 tablet, Rfl: 1   Cholecalciferol (DIALYVITE VITAMIN D 5000) 125 MCG (5000 UT) capsule, Take 5,000 Units by mouth daily., Disp: , Rfl:    esomeprazole (NEXIUM) 40 MG capsule, TAKE 1 CAPSULE DAILY, Disp: 90 capsule, Rfl: 3   ferrous sulfate 325 (65 FE) MG tablet, Take 325 mg by mouth every other day., Disp: , Rfl:    Fluticasone-Umeclidin-Vilant (TRELEGY ELLIPTA) 100-62.5-25 MCG/ACT AEPB, USE 1 INHALATION DAILY, Disp: 180 each, Rfl: 3   Guaifenesin (MUCINEX MAXIMUM STRENGTH) 1200 MG TB12, Take 1,200 mg by mouth 2 (two) times daily., Disp: , Rfl:    levothyroxine (SYNTHROID) 88 MCG tablet, TAKE 1 TABLET(88 MCG) BY MOUTH DAILY, Disp: 30 tablet, Rfl: 2   Multiple Vitamins-Minerals (IMMUNE SUPPORT PO), Take 2 tablets by mouth daily., Disp: , Rfl:    Respiratory Therapy Supplies (FLUTTER) DEVI, Use as directed, Disp: 1 each, Rfl: 0   rosuvastatin (CRESTOR) 20 MG tablet, Take 1 tablet (20 mg total) by mouth daily., Disp: 90 tablet, Rfl: 2   temazepam (RESTORIL) 15 MG capsule, TAKE 1 CAPSULE AT BEDTIME AS NEEDED, Disp: 90 capsule, Rfl: 0   vitamin B-12 (CYANOCOBALAMIN) 1000 MCG tablet, Take 1,000 mcg by mouth daily., Disp: , Rfl:  No current facility-administered medications for this visit.  Facility-Administered Medications Ordered in Other Visits:    Sonafine  emulsion 1 application, 1 application , Topical, Once, Antony Blackbird, MD    Josephine Igo, DO [facility] 12/10/2022 9:34 AM

## 2022-12-15 IMAGING — DX DG CHEST 2V
2 series · 2 of 2 positions shown · non-contrast
Comparison: CT chest dated December 31, 2020. Chest x-ray dated
October 08, 2020.

CLINICAL DATA: Shortness of breath.  History of lung cancer.

EXAM:
CHEST - 2 VIEW

[chest pa]
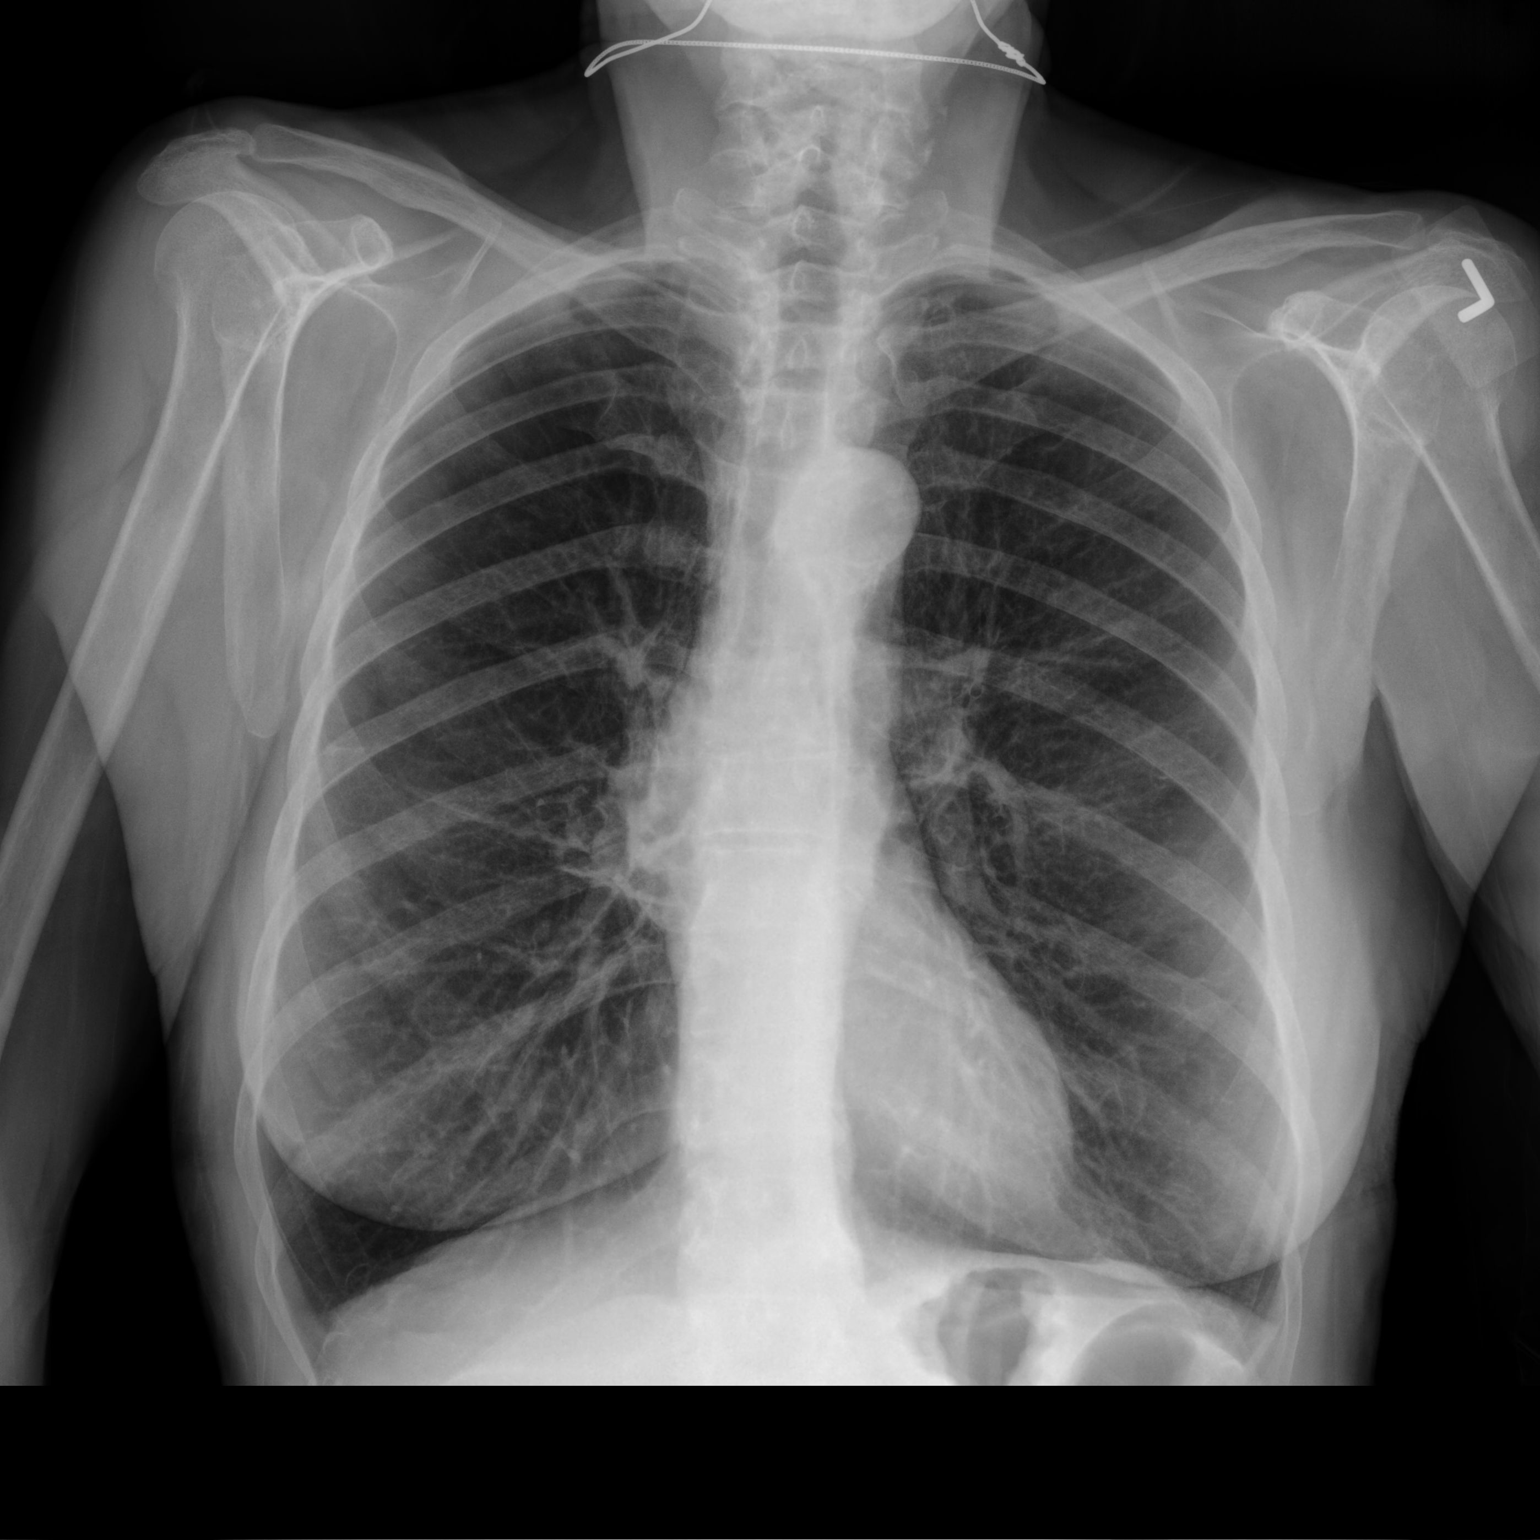

[chest lat]
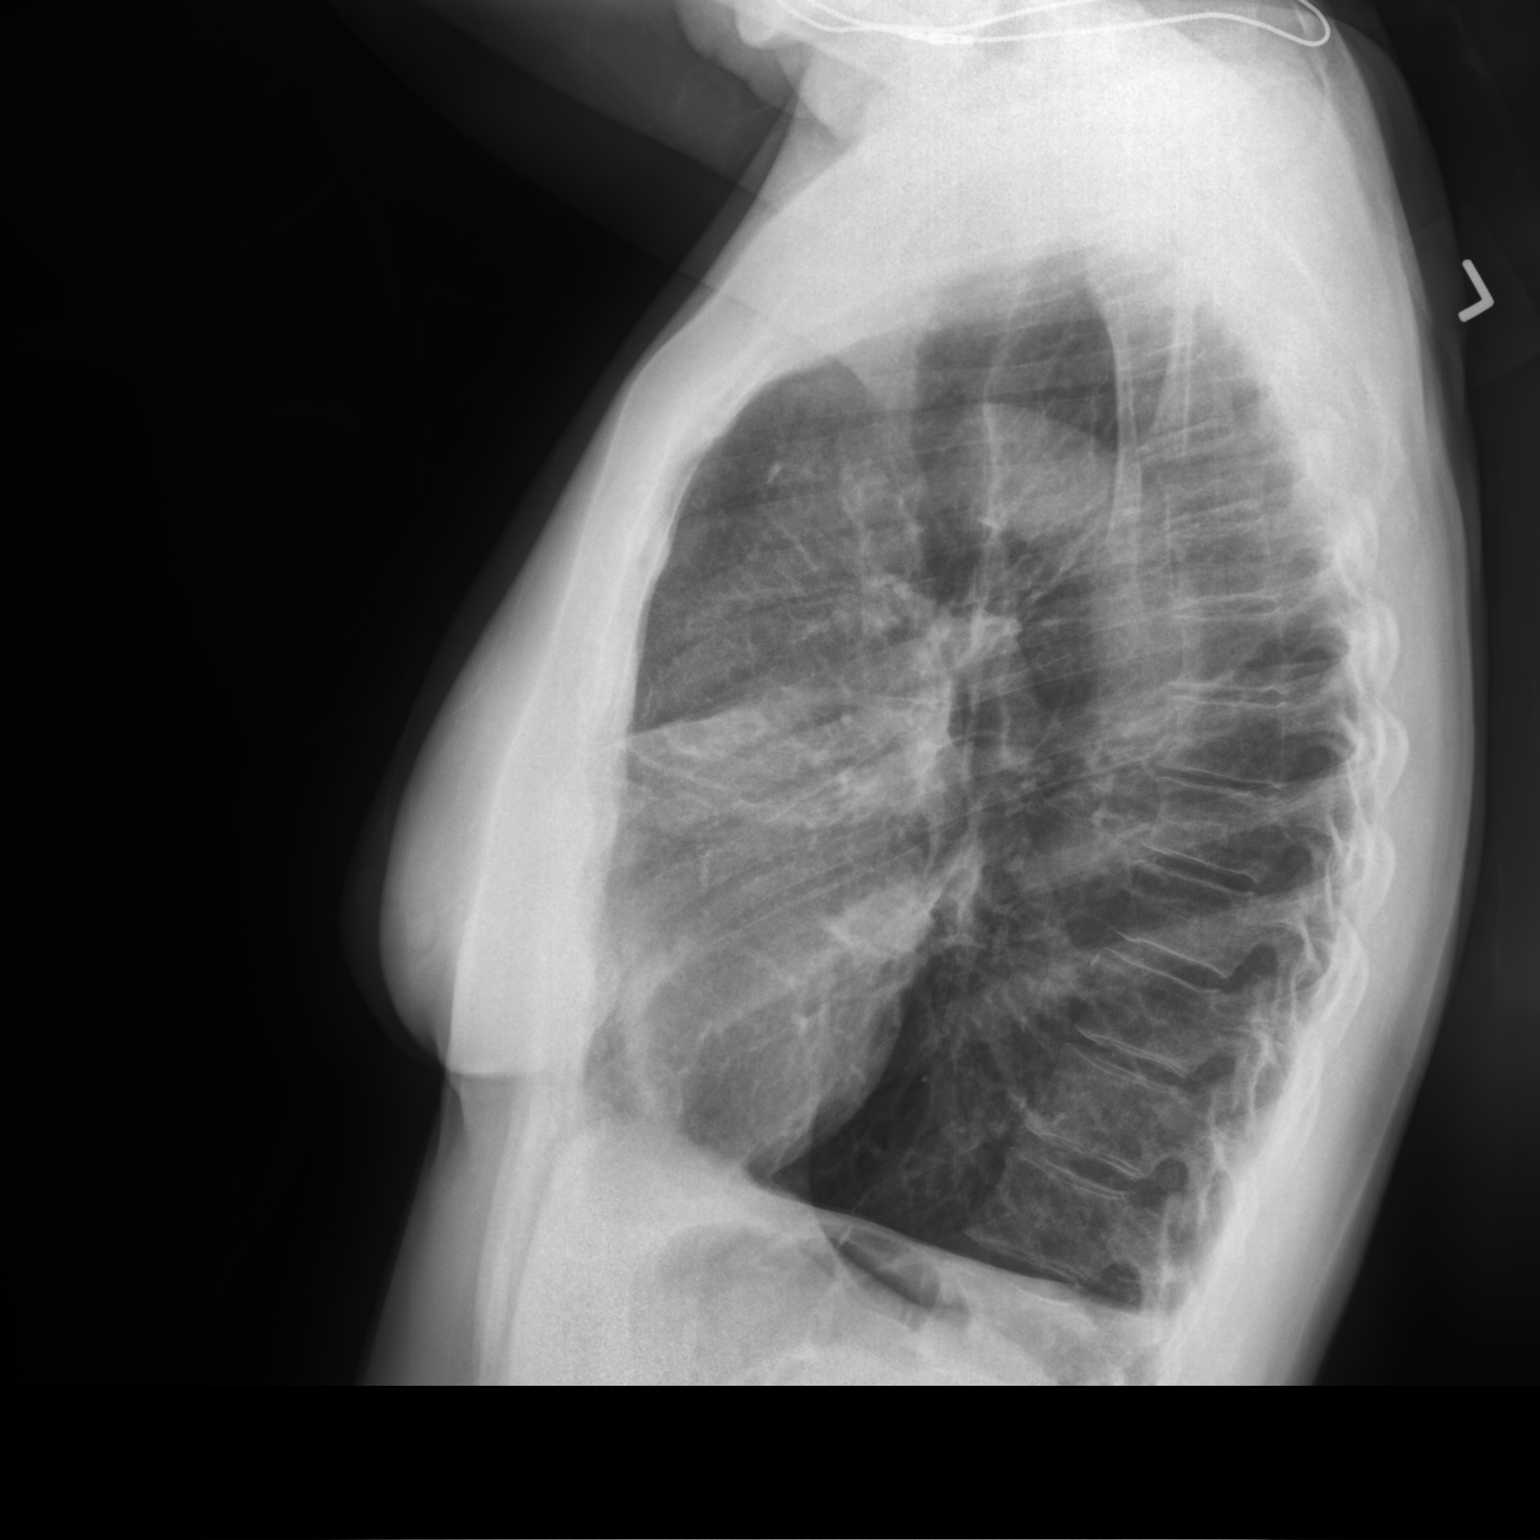

[2 of 2 positions shown; findings below may reference images not displayed]

FINDINGS: The heart size and mediastinal contours are within normal limits.
The lungs remain hyperinflated with emphysematous changes. Partial
collapse of the right middle lobe, best appreciated on the lateral
view, improved since recent CT. No focal consolidation, pleural
effusion, or pneumothorax. No acute osseous abnormality.
IMPRESSION: 1. Partial right middle lobe collapse, improved from recent CT.
2. COPD.

## 2023-01-23 ENCOUNTER — Other Ambulatory Visit: Payer: Self-pay | Admitting: Medical

## 2023-01-25 ENCOUNTER — Telehealth: Payer: Self-pay | Admitting: Medical Oncology

## 2023-01-25 NOTE — Telephone Encounter (Signed)
Back pain that radiates to right side and stomach. Started 3 days ago. I instructed her to contact PCP. She agreed.

## 2023-01-30 ENCOUNTER — Ambulatory Visit (INDEPENDENT_AMBULATORY_CARE_PROVIDER_SITE_OTHER): Payer: Medicare Other | Admitting: Medical

## 2023-01-30 VITALS — BP 120/80 | HR 94 | Temp 98.5°F | Wt 136.4 lb

## 2023-01-30 DIAGNOSIS — M545 Low back pain, unspecified: Secondary | ICD-10-CM | POA: Diagnosis not present

## 2023-01-30 DIAGNOSIS — M25551 Pain in right hip: Secondary | ICD-10-CM | POA: Diagnosis not present

## 2023-01-30 MED ORDER — METHOCARBAMOL 500 MG PO TABS: 500.0000 mg | ORAL_TABLET | Freq: Two times a day (BID) | ORAL | 0 refills | Status: DC | PRN

## 2023-01-30 NOTE — Progress Notes (Signed)
Subjective:  Katrina Campbell is a 67 y.o. female who presents for Chief Complaint  Patient presents with   Pain    Back or hip pain for the last week. She will have pain in back or have pain in hip. It rotates and lasting 24/7     Here for pain in side that went to back, then to other side.   Sometimes only right, sometimes only left.   Currently just right lower back.   Uses a treadmill for exercise, but not since last week when this pain started.  No other recent strenuous activity, no recent fall or trauma or injury.   Uses heating pad some.    No fever, no chills, no new abdominal or urinary symptoms, no change in appetite.  No other aggravating or relieving factors.    No other c/o.  Past Medical History:  Diagnosis Date   Anxiety    COPD (chronic obstructive pulmonary disease) (HCC)    Coronary artery disease    Dental staining 07/15/2019   Depression    History of radiation therapy    Right lung- 11/03/20-12/15/20- Dr. 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     albuterol (PROVENTIL) (2.5 MG/3ML) 0.083% nebulizer solution INHALE 3 MLS (2.5 MG TOTAL) VIA NEBULIZER EVERY 6 HOURS AS NEEDED FOR WHEEZING OR SHORTNESS OF BREATH 540 mL 7   albuterol (VENTOLIN HFA) 108 (90 Base) MCG/ACT inhaler Inhale 2 puffs into the lungs every 6 (six) hours as needed for wheezing or shortness of breath.     aspirin EC 81 MG tablet Take 81 mg by mouth daily with lunch.      buPROPion (WELLBUTRIN XL) 300 MG 24 hr tablet Take 1 tablet (300 mg total) by mouth daily. 90 tablet 1   Cholecalciferol (DIALYVITE VITAMIN D 5000) 125 MCG (5000 UT) capsule Take 5,000 Units by mouth daily.      esomeprazole (NEXIUM) 40 MG capsule TAKE 1 CAPSULE DAILY 90 capsule 1   ferrous sulfate 325 (65 FE) MG tablet Take 325 mg by mouth every other day.     Fluticasone-Umeclidin-Vilant (TRELEGY ELLIPTA) 100-62.5-25 MCG/ACT AEPB USE 1 INHALATION DAILY 180 each 3   Guaifenesin (MUCINEX MAXIMUM STRENGTH) 1200 MG TB12 Take 1,200 mg by mouth 2 (two) times daily.     levothyroxine (SYNTHROID) 88 MCG tablet TAKE 1 TABLET(88 MCG) BY MOUTH DAILY 30 tablet 2   Multiple Vitamins-Minerals (IMMUNE SUPPORT PO) Take 2 tablets by mouth daily.     rosuvastatin (CRESTOR) 20 MG tablet Take 1 tablet (20 mg total) by mouth daily. 90 tablet 2   temazepam (RESTORIL) 15 MG capsule TAKE 1 CAPSULE AT BEDTIME AS NEEDED 90 capsule 0   vitamin B-12 (CYANOCOBALAMIN) 1000 MCG tablet Take 1,000 mcg by mouth daily.     Respiratory Therapy Supplies (FLUTTER) DEVI Use as directed 1 each 0   Current Facility-Administered Medications on File Prior to Visit  Medication Dose Route Frequency Provider Last Rate Last Admin   Sonafine emulsion 1 application  1 application  Topical Once Antony Blackbird, MD         The following portions of the patient's history were reviewed  and updated as appropriate: allergies, current medications, past family history, past medical history, past social history, past surgical history and problem list.  ROS Otherwise as in subjective above  Objective: BP 120/80   Pulse 94   Temp 98.5 F (36.9 C)   Wt 136 lb 6.4 oz (61.9 kg)   BMI 23.41 kg/m   General appearance: alert, no distress, well developed, well nourished Back: Mild tenderness right lumbar spine paraspinal, otherwise back nontender, range of motion about 80% of normal MSK: Right hip with decreased range of motion in general, mild pain with palpation over the right hip otherwise leg nontender, somewhat tight in both hamstrings, no trochanter tenderness, no swelling or other abnormality Pulses: 2+ radial pulses, 2+ pedal pulses, normal cap  refill Ext: no edema Neuro: Legs neurovascularly intact    Assessment: Encounter Diagnoses  Name Primary?   Right hip pain Yes   Low back pain, unspecified back pain laterality, unspecified chronicity, unspecified whether sciatica present      Plan: We discussed symptoms and exam findings.  Likely some back spasm and musculoskeletal pain in the right hip with likely arthritic changes.  She does have prior DDD changes on 2021 left hip and lumbar xray  We discussed stretching, relative rest, begin Robaxin once or twice daily the next few days for spasm in the back, begin some Aleve twice daily for a few days for pain and inflammation.  If complete resolution within a week then continue to work on range of motion stretching since she does have some decreased right hip range of motion.  However if not much improvement in the next week then consider imaging and physical therapy referral  Offered baseline x-rays, offered physical therapy referral.  She will hold off for now but advised if no improvement at all within the next week then I would recommend considering physical therapy and baseline x-ray.  We discussed her concern given history of lung cancer.  [name] was seen today for pain.  Diagnoses and all orders for this visit:  Right hip pain  Low back pain, unspecified back pain laterality, unspecified chronicity, unspecified whether sciatica present  Other orders -     methocarbamol (ROBAXIN) 500 MG tablet; Take 1 tablet (500 mg total) by mouth 2 (two) times daily as needed for muscle spasms.    Follow up: 1wk

## 2023-02-17 ENCOUNTER — Ambulatory Visit: Payer: Medicare Other | Admitting: Internal Medicine

## 2023-02-20 ENCOUNTER — Other Ambulatory Visit: Payer: Self-pay | Admitting: Medical

## 2023-03-08 ENCOUNTER — Ambulatory Visit
Admission: RE | Admit: 2023-03-08 | Discharge: 2023-03-08 | Disposition: A | Discharge status: Home / Self Care | Payer: Medicare Other | Source: Ambulatory Visit | Attending: Medical | Admitting: Medical

## 2023-03-08 ENCOUNTER — Encounter: Payer: Self-pay | Admitting: Medical

## 2023-03-08 ENCOUNTER — Encounter: Payer: Self-pay | Admitting: Medical Oncology

## 2023-03-08 ENCOUNTER — Telehealth: Payer: Medicare Other | Admitting: Medical

## 2023-03-08 ENCOUNTER — Encounter: Payer: Self-pay | Admitting: Internal Medicine

## 2023-03-08 VITALS — BP 131/76 | Wt 138.0 lb

## 2023-03-08 DIAGNOSIS — R059 Cough, unspecified: Secondary | ICD-10-CM

## 2023-03-08 DIAGNOSIS — J42 Unspecified chronic bronchitis: Secondary | ICD-10-CM

## 2023-03-08 DIAGNOSIS — R042 Hemoptysis: Secondary | ICD-10-CM

## 2023-03-08 DIAGNOSIS — Z85118 Personal history of other malignant neoplasm of bronchus and lung: Secondary | ICD-10-CM | POA: Diagnosis not present

## 2023-03-08 NOTE — Progress Notes (Signed)
Subjective:     Patient ID: Katrina Campbell, female   DOB: 05/02/1955, 67 y.o.   MRN: [id number]  This visit type was conducted due to national recommendations for restrictions regarding the COVID-19 Pandemic (e.g. social distancing) in an effort to limit this patient's exposure and mitigate transmission in our community.  Due to their co-morbid illnesses, this patient is at least at moderate risk for complications without adequate follow up.  This format is felt to be most appropriate for this patient at this time.    Documentation for virtual audio and video telecommunications through [facility] encounter:  The patient was located at home. The provider was located in the office. The patient did consent to this visit and is aware of possible charges through their insurance for this visit.  The other persons participating in this telemedicine service were none. Time spent on call was 20 minutes and in review of previous records 20 minutes total.  This virtual service is not related to other E/M service within previous 7 days.   HPI Chief Complaint  Patient presents with   other    Discuss cancer concerns, coughing up blood yesterday and this morning, called cancer doctor, needs to see medical doctor, it was bright red, and some mucous mixed in, no fever, no drainage,    Yesterday evening had a cough, which is not unusual but this time coughed up about a tsp of blood.  Had similar cough this morning with some pinkish blood coloration.  In last 4-5 days no new illness symptoms no fever, no worse mucous.     No nasal congestion, no mucous from nose.  No blood tinged mucous.    For last week has had some sob.  Checks O2 daily, runs 96-98%.  Today 98%.  If go to vacuum or make up bed, more short winded.  No prior PE/DVT  No current leg swelling or pain  She notes some nausea of late.    Not on oxygen.  No other aggravating or relieving factors. No other complaint.   Past Medical  History:  Diagnosis Date   Anxiety    COPD (chronic obstructive pulmonary disease) (HCC)    Coronary artery disease    Dental staining 07/15/2019   Depression    History of radiation therapy    Right lung- 11/03/20-12/15/20- Dr. 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     albuterol (PROVENTIL) (2.5 MG/3ML) 0.083% nebulizer solution INHALE 3 MLS (2.5 MG TOTAL) VIA NEBULIZER EVERY 6 HOURS AS NEEDED FOR WHEEZING OR SHORTNESS OF BREATH 540 mL 7   albuterol (VENTOLIN HFA) 108 (90 Base) MCG/ACT inhaler Inhale 2 puffs into the lungs every 6 (six) hours as needed for wheezing or shortness of breath.     aspirin EC 81 MG tablet Take 81 mg by mouth daily with lunch.      Cholecalciferol (DIALYVITE VITAMIN D 5000) 125 MCG (5000 UT) capsule Take 5,000 Units by mouth daily.     esomeprazole (NEXIUM) 40 MG capsule TAKE 1 CAPSULE DAILY 90 capsule 1   ferrous sulfate 325 (65 FE) MG tablet Take 325 mg by mouth every other day.  Fluticasone-Umeclidin-Vilant (TRELEGY ELLIPTA) 100-62.5-25 MCG/ACT AEPB USE 1 INHALATION DAILY 180 each 3   Guaifenesin (MUCINEX MAXIMUM STRENGTH) 1200 MG TB12 Take 1,200 mg by mouth 2 (two) times daily.     levothyroxine (SYNTHROID) 88 MCG tablet TAKE 1 TABLET(88 MCG) BY MOUTH DAILY 30 tablet 2   Multiple Vitamins-Minerals (IMMUNE SUPPORT PO) Take 2 tablets by mouth daily.     Respiratory Therapy Supplies (FLUTTER) DEVI Use as directed 1 each 0   rosuvastatin (CRESTOR) 20 MG tablet Take 1 tablet (20 mg total) by mouth daily. 90 tablet 2   temazepam (RESTORIL) 15 MG capsule TAKE 1 CAPSULE AT BEDTIME AS NEEDED 90 capsule 0   vitamin B-12 (CYANOCOBALAMIN)  1000 MCG tablet Take 1,000 mcg by mouth daily.     methocarbamol (ROBAXIN) 500 MG tablet Take 1 tablet (500 mg total) by mouth 2 (two) times daily as needed for muscle spasms. (Patient not taking: Reported on 03/08/2023) 20 tablet 0   Current Facility-Administered Medications on File Prior to Visit  Medication Dose Route Frequency Provider Last Rate Last Admin   Sonafine emulsion 1 application  1 application  Topical Once Antony Blackbird, MD        Review of Systems As in subjective    Objective:   Physical Exam Due to coronavirus pandemic stay at home measures, patient visit was virtual and they were not examined in person.   BP 131/76   Wt 138 lb (62.6 kg)   SpO2 98%   BMI 23.69 kg/m   Gen: wd, wn, nad No labored breathing or wheezing Answers questions appropriately  Pulse ox at home right now 98% and pulse 77 bpm    Assessment:     Encounter Diagnoses  Name Primary?   Hemoptysis Yes   Cough, unspecified type    Chronic bronchitis, unspecified chronic bronchitis type (HCC)    History of lung cancer         Plan:     We discussed her new hemoptysis symptoms.  We will send for stat x-ray of the chest today.  I reviewed her last oncology notes from 11/17/2022.  At that time she was a stage IIIa ([place], N2, MO) non-small cell lung cancer, squamous cell carcinoma diagnosed in July 2022.  She has already completed weekly carboplatin for AUC of 2 and paclitaxel 45 Mg/M2 status post 7 cycles, I completed a course of consolidation treatment with immunotherapy with Imfinzi 1500 mg IV every 4 weeks status post 13 cycles.  She just had a chest CT in August 2024 which was reviewed  Given the new symptoms today we will get a baseline chest x-ray  Continue current medications including Trelegy, albuterol, thyroid medicine, rest of medicines as usual  [name] was seen today for other.  Diagnoses and all orders for this visit:  Hemoptysis -     DG Chest 2 View; Future  Cough,  unspecified type -     DG Chest 2 View; Future  Chronic bronchitis, unspecified chronic bronchitis type (HCC)  History of lung cancer    Follow up pending CXR

## 2023-03-09 ENCOUNTER — Other Ambulatory Visit: Payer: Self-pay | Admitting: Medical

## 2023-03-09 MED ORDER — DOXYCYCLINE HYCLATE 100 MG PO TABS: 100.0000 mg | ORAL_TABLET | Freq: Two times a day (BID) | ORAL | 0 refills | Status: DC

## 2023-03-09 NOTE — Progress Notes (Signed)
Regarding your 2 recent episodes of hemoptysis/blood in sputum/cough -  chest x-ray does not show any new nodules or mass.  Keep in mind chest x-ray is not as accurate as a CT so small nodules could be missed.  I recommend doing a few days of guaifenesin Mucinex and antibiotic doxycycline.  I will send the antibiotic to the pharmacy.  I will copy Dr. Arbutus Ped on this message as he will probably want to get you back in soon to discuss further and to possibly update the CT chest sooner than 6 months recommendation from last oncology visit

## 2023-03-10 ENCOUNTER — Telehealth: Payer: Self-pay

## 2023-03-10 NOTE — Telephone Encounter (Addendum)
LVM for a return call regards to below.   ----- Message from Katrina Campbell Heilingoetter sent at 03/10/2023  7:32 AM EST ----- Dr. Arbutus Ped wants to move her scan sooner. I changed the expected date to next week. Can you call her and give her the number to radiology scheduling [phone]. Please ask her to call them to move up her scan. She can ask them which location has earliest availability. After her scan is scheduled, have her send [place] a mychart message so we know when to move her appointment up a few days later to review the results in the office.

## 2023-03-10 NOTE — Telephone Encounter (Signed)
Patient called and confirmed new CT scheduled for 12/13 @ 1430.  Lab appt made at 1330. Scheduled message sent for f/u for about a week later.

## 2023-03-13 ENCOUNTER — Telehealth: Payer: Self-pay | Admitting: Internal Medicine

## 2023-03-13 NOTE — Telephone Encounter (Signed)
Patient is aware of scheduled appointment times/dates for follow up per scheduling message

## 2023-03-17 ENCOUNTER — Ambulatory Visit (HOSPITAL_COMMUNITY)
Admission: RE | Admit: 2023-03-17 | Discharge: 2023-03-17 | Disposition: A | Discharge status: Home / Self Care | Payer: Medicare Other | Source: Ambulatory Visit | Attending: Internal Medicine | Admitting: Internal Medicine

## 2023-03-17 ENCOUNTER — Inpatient Hospital Stay: Payer: Medicare Other | Attending: Internal Medicine

## 2023-03-17 DIAGNOSIS — C3401 Malignant neoplasm of right main bronchus: Secondary | ICD-10-CM | POA: Diagnosis present

## 2023-03-17 DIAGNOSIS — D649 Anemia, unspecified: Secondary | ICD-10-CM | POA: Insufficient documentation

## 2023-03-17 DIAGNOSIS — C3490 Malignant neoplasm of unspecified part of unspecified bronchus or lung: Secondary | ICD-10-CM | POA: Insufficient documentation

## 2023-03-17 LAB — CBC WITH DIFFERENTIAL (CANCER CENTER ONLY)
Abs Immature Granulocytes: 0.01 10*3/uL (ref 0.00–0.07)
Basophils Absolute: 0 10*3/uL (ref 0.0–0.1)
Basophils Relative: 0 %
Eosinophils Absolute: 0.1 10*3/uL (ref 0.0–0.5)
Eosinophils Relative: 1 %
HCT: 40.2 % (ref 36.0–46.0)
Hemoglobin: 13.5 g/dL (ref 12.0–15.0)
Immature Granulocytes: 0 %
Lymphocytes Relative: 24 %
Lymphs Abs: 1.5 10*3/uL (ref 0.7–4.0)
MCH: 30.1 pg (ref 26.0–34.0)
MCHC: 33.6 g/dL (ref 30.0–36.0)
MCV: 89.7 fL (ref 80.0–100.0)
Monocytes Absolute: 0.4 10*3/uL (ref 0.1–1.0)
Monocytes Relative: 6 %
Neutro Abs: 4.3 10*3/uL (ref 1.7–7.7)
Neutrophils Relative %: 69 %
Platelet Count: 141 10*3/uL — ABNORMAL LOW (ref 150–400)
RBC: 4.48 MIL/uL (ref 3.87–5.11)
RDW: 13 % (ref 11.5–15.5)
WBC Count: 6.3 10*3/uL (ref 4.0–10.5)
nRBC: 0 % (ref 0.0–0.2)

## 2023-03-17 LAB — CMP (CANCER CENTER ONLY)
ALT: 38 U/L (ref 0–44)
AST: 31 U/L (ref 15–41)
Albumin: 4.4 g/dL (ref 3.5–5.0)
Alkaline Phosphatase: 81 U/L (ref 38–126)
Anion gap: 4 — ABNORMAL LOW (ref 5–15)
BUN: 10 mg/dL (ref 8–23)
CO2: 29 mmol/L (ref 22–32)
Calcium: 9.4 mg/dL (ref 8.9–10.3)
Chloride: 110 mmol/L (ref 98–111)
Creatinine: 0.74 mg/dL (ref 0.44–1.00)
GFR, Estimated: >60 mL/min (ref >60)
Glucose, Bld: 81 mg/dL (ref 70–99)
Potassium: 4.4 mmol/L (ref 3.5–5.1)
Sodium: 143 mmol/L (ref 135–145)
Total Bilirubin: 0.4 mg/dL (ref <1.2)
Total Protein: 6.9 g/dL (ref 6.5–8.1)

## 2023-03-17 MED ORDER — IOHEXOL 300 MG/ML  SOLN
75.0000 mL | Freq: Once | INTRAMUSCULAR | Status: AC | PRN
  Administered 2023-03-17: 75 mL via INTRAVENOUS

## 2023-03-24 ENCOUNTER — Inpatient Hospital Stay (HOSPITAL_BASED_OUTPATIENT_CLINIC_OR_DEPARTMENT_OTHER): Payer: Medicare Other | Admitting: Internal Medicine

## 2023-03-24 VITALS — BP 146/80 | HR 71 | Temp 97.9°F | Resp 17 | Ht 64.0 in | Wt 137.1 lb

## 2023-03-24 DIAGNOSIS — C3490 Malignant neoplasm of unspecified part of unspecified bronchus or lung: Secondary | ICD-10-CM

## 2023-03-24 DIAGNOSIS — C3401 Malignant neoplasm of right main bronchus: Secondary | ICD-10-CM | POA: Diagnosis not present

## 2023-03-24 NOTE — Progress Notes (Signed)
[facility] [phone]   [phone]  OFFICE PROGRESS NOTE  Jac Canavan, PA-C [address] [place] [address]  DIAGNOSIS: Stage IIIA (TX, N2, M0) non-small cell lung cancer, squamous cell carcinoma presented with subcarinal mass/lymphadenopathy diagnosed in July 2022.   PRIOR THERAPY:  1) Weekly carboplatin for AUC of 2 and paclitaxel 45 mg/m2.  First dose expected on 11/02/2020.  Status post 7 cycles.  Last dose was given 12/14/2020. 2) Consolidation treatment with immunotherapy with Imfinzi 1500 Mg IV every 4 weeks.  First dose 01/14/2021.  Status post 13 cycles   CURRENT THERAPY: Observation.  INTERVAL HISTORY: Katrina Campbell 67 y.o. female returns to the clinic today for follow-up visit.Discussed the use of AI scribe software for clinical note transcription with the patient, who gave verbal consent to proceed.  History of Present Illness   Katrina Campbell, a 67 year old patient with a history of stage 3A non-small cell lung cancer (squamous cell carcinoma), was diagnosed in July 2022. She underwent chemotherapy and radiation with weekly carboplatin and paclitaxel, followed by a year of Imfinzi immunotherapy, which was completed in September 2023.  Since the completion of her immunotherapy, the patient has reported a decrease in energy levels, particularly in the evenings. She has also experienced episodes of hemoptysis, which began as a single episode with a significant amount of bright red blood, followed by several smaller episodes. The frequency of these episodes has decreased to every two to three days, and the blood is no longer as bright. The patient does not report a significant cough and believes the blood is coming from deep within her bronchus.  Despite these symptoms, the patient has not experienced any headaches, changes in vision, nausea, vomiting, or weight loss. She has, however, reported weight gain. The patient remains active  and continues to manage her health conditions.        MEDICAL HISTORY: Past Medical History:  Diagnosis Date   Anxiety    COPD (chronic obstructive pulmonary disease) (HCC)    Coronary artery disease    Dental staining 07/15/2019   Depression    History of radiation therapy    Right lung- 11/03/20-12/15/20- Dr. 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  MEDICATIONS:  Current Outpatient Medications  Medication Sig Dispense Refill   acetaminophen (TYLENOL) 500 MG tablet Take 500 mg by mouth every 6 (six) hours as needed for moderate pain or headache.     albuterol (PROVENTIL) (2.5 MG/3ML) 0.083% nebulizer solution INHALE 3 MLS (2.5 MG TOTAL) VIA NEBULIZER EVERY 6 HOURS AS NEEDED FOR WHEEZING OR SHORTNESS OF BREATH 540 mL 7   albuterol (VENTOLIN HFA) 108 (90 Base) MCG/ACT inhaler Inhale 2 puffs into the lungs every 6 (six) hours as needed for wheezing or shortness of breath.     aspirin EC 81 MG tablet Take 81 mg by mouth daily with lunch.      Cholecalciferol (DIALYVITE VITAMIN D 5000) 125 MCG (5000 UT) capsule Take 5,000 Units by mouth daily.     doxycycline (VIBRA-TABS) 100 MG tablet Take 1 tablet (100 mg total) by mouth 2 (two) times daily. 10 tablet 0   esomeprazole (NEXIUM) 40 MG capsule TAKE 1 CAPSULE DAILY 90 capsule 1   ferrous sulfate 325 (65  FE) MG tablet Take 325 mg by mouth every other day.     Fluticasone-Umeclidin-Vilant (TRELEGY ELLIPTA) 100-62.5-25 MCG/ACT AEPB USE 1 INHALATION DAILY 180 each 3   Guaifenesin (MUCINEX MAXIMUM STRENGTH) 1200 MG TB12 Take 1,200 mg by mouth 2 (two) times daily.     levothyroxine (SYNTHROID) 88 MCG tablet TAKE 1 TABLET(88 MCG) BY MOUTH DAILY 30 tablet 2   methocarbamol (ROBAXIN) 500  MG tablet Take 1 tablet (500 mg total) by mouth 2 (two) times daily as needed for muscle spasms. (Patient not taking: Reported on 03/08/2023) 20 tablet 0   Multiple Vitamins-Minerals (IMMUNE SUPPORT PO) Take 2 tablets by mouth daily.     Respiratory Therapy Supplies (FLUTTER) DEVI Use as directed 1 each 0   rosuvastatin (CRESTOR) 20 MG tablet Take 1 tablet (20 mg total) by mouth daily. 90 tablet 2   temazepam (RESTORIL) 15 MG capsule TAKE 1 CAPSULE AT BEDTIME AS NEEDED 90 capsule 0   vitamin B-12 (CYANOCOBALAMIN) 1000 MCG tablet Take 1,000 mcg by mouth daily.     No current facility-administered medications for this visit.   Facility-Administered Medications Ordered in Other Visits  Medication Dose Route Frequency Provider Last Rate Last Admin   Sonafine emulsion 1 application  1 application  Topical Once Antony Blackbird, MD        SURGICAL HISTORY:  Past Surgical History:  Procedure Laterality Date   BALLOON DILATION N/A 04/29/2021   Procedure: BALLOON DILATION;  Surgeon: Rachael Fee, MD;  Location: [facility];  Service: Endoscopy;  Laterality: N/A;   BIOPSY  01/14/2021   Procedure: BIOPSY;  Surgeon: Josephine Igo, DO;  Location: [facility];  Service: Pulmonary;;   BRONCHIAL BIOPSY  10/08/2020   Procedure: BRONCHIAL BIOPSIES;  Surgeon: Josephine Igo, DO;  Location: [facility];  Service: Pulmonary;;   BRONCHIAL BRUSHINGS  10/08/2020   Procedure: BRONCHIAL BRUSHINGS;  Surgeon: Josephine Igo, DO;  Location: [facility];  Service: Pulmonary;;   BRONCHIAL BRUSHINGS  01/14/2021   Procedure: BRONCHIAL BRUSHINGS;  Surgeon: Josephine Igo, DO;  Location: [facility];  Service: Pulmonary;;   BRONCHIAL WASHINGS  10/08/2020   Procedure: BRONCHIAL WASHINGS;  Surgeon: Josephine Igo, DO;  Location: [facility];  Service: Pulmonary;;   BRONCHIAL WASHINGS  01/14/2021   Procedure: BRONCHIAL WASHINGS;  Surgeon: Josephine Igo, DO;  Location: [facility];  Service: Pulmonary;;    ESOPHAGOGASTRODUODENOSCOPY (EGD) WITH PROPOFOL N/A 04/29/2021   Procedure: ESOPHAGOGASTRODUODENOSCOPY (EGD) WITH PROPOFOL;  Surgeon: Rachael Fee, MD;  Location: [facility];  Service: Endoscopy;  Laterality: N/A;   FINE NEEDLE ASPIRATION  10/08/2020   Procedure: FINE NEEDLE ASPIRATION (FNA) LINEAR;  Surgeon: Josephine Igo, DO;  [facility]: [facility];  Service: Pulmonary;;   LEFT HEART CATH AND CORONARY ANGIOGRAPHY N/A 11/07/2017   Procedure: LEFT HEART CATH AND CORONARY ANGIOGRAPHY;  Surgeon: Yvonne Kendall, MD;  Location: [facility];  Service: Cardiovascular;  Laterality: N/A;   NASAL SINUS SURGERY  1986   VIDEO BRONCHOSCOPY Bilateral 01/29/2019   Procedure: VIDEO BRONCHOSCOPY WITH FLUORO;  Surgeon: Chilton Greathouse, MD;  Location: [facility];  Service: Cardiopulmonary;  Laterality: Bilateral;   VIDEO BRONCHOSCOPY Right 01/14/2021   Procedure: VIDEO BRONCHOSCOPY WITHOUT FLUORO;  Surgeon: Josephine Igo, DO;  [facility];  Service: Pulmonary;  Laterality: Right;  possible cryotherapy   VIDEO BRONCHOSCOPY WITH ENDOBRONCHIAL ULTRASOUND N/A 10/08/2020   Procedure: VIDEO BRONCHOSCOPY WITH ENDOBRONCHIAL ULTRASOUND;  Surgeon: Josephine Igo, DO;  Location: [facility];  Service: Pulmonary;  Laterality: N/A;    REVIEW OF SYSTEMS:  Constitutional: positive for fatigue Eyes: negative Ears, nose, mouth, throat, and face: negative Respiratory: positive for hemoptysis Cardiovascular: negative Gastrointestinal: negative Genitourinary:negative Integument/breast: negative Hematologic/lymphatic: negative Musculoskeletal:negative Neurological: negative Behavioral/Psych: negative Endocrine: negative Allergic/Immunologic: negative   PHYSICAL EXAMINATION: General appearance: alert, cooperative, fatigued, and no distress Head: Normocephalic, without obvious abnormality, atraumatic Neck: no adenopathy, no JVD, supple, symmetrical, trachea midline, and thyroid not enlarged,  symmetric, no tenderness/mass/nodules Lymph nodes: Cervical, supraclavicular, and axillary nodes normal. Resp: clear to auscultation bilaterally Back: symmetric, no curvature. ROM normal. No CVA tenderness. Cardio: regular rate and rhythm, S1, S2 normal, no murmur, click, rub or gallop GI: soft, non-tender; bowel sounds normal; no masses,  no organomegaly Extremities: extremities normal, atraumatic, no cyanosis or edema Neurologic: Alert and oriented X 3, normal strength and tone. Normal symmetric reflexes. Normal coordination and gait  ECOG PERFORMANCE STATUS: 1 - Symptomatic but completely ambulatory  Blood pressure (!) 146/80, pulse 71, temperature 97.9 F (36.6 C), temperature source Temporal, resp. rate 17, height 5\' 4"  (1.626 m), weight 137 lb 1.6 oz (62.2 kg), SpO2 100%.  LABORATORY DATA: Lab Results  Component Value Date   WBC 6.3 03/17/2023   HGB 13.5 03/17/2023   HCT 40.2 03/17/2023   MCV 89.7 03/17/2023   PLT 141 (L) 03/17/2023      Chemistry      Component Value Date/Time   NA 143 03/17/2023 1308   NA 144 02/10/2020 0813   K 4.4 03/17/2023 1308   CL 110 03/17/2023 1308   CO2 29 03/17/2023 1308   BUN 10 03/17/2023 1308   BUN 10 02/10/2020 0813   CREATININE 0.74 03/17/2023 1308   CREATININE 0.96 07/22/2021 0936      Component Value Date/Time   CALCIUM 9.4 03/17/2023 1308   ALKPHOS 81 03/17/2023 1308   AST 31 03/17/2023 1308   ALT 38 03/17/2023 1308   BILITOT 0.4 03/17/2023 1308       RADIOGRAPHIC STUDIES: CT Chest W Contrast Result Date: 03/23/2023 CLINICAL DATA:  Non-small-cell lung cancer.  Restaging. EXAM: CT CHEST WITH CONTRAST TECHNIQUE: Multidetector CT imaging of the chest was performed during intravenous contrast administration. RADIATION DOSE REDUCTION: This exam was performed according to the departmental dose-optimization program which includes automated exposure control, adjustment of the mA and/or kV according to patient size and/or use of  iterative reconstruction technique. CONTRAST:  75mL OMNIPAQUE IOHEXOL 300 MG/ML  SOLN COMPARISON:  None Available. FINDINGS: Cardiovascular: The heart size is normal. No substantial pericardial effusion. Ascending thoracic aorta measures 3.9 cm diameter. Coronary artery calcification is evident. Mild atherosclerotic calcification is noted in the wall of the thoracic aorta. Mediastinum/Nodes: No mediastinal lymphadenopathy. There is no hilar lymphadenopathy. The esophagus has normal imaging features. There is no axillary lymphadenopathy. Lungs/Pleura: Centrilobular and paraseptal emphysema evident. Right upper lobe collapse again noted, stable. There is some stable atelectasis or scarring in the right middle lobe. No new suspicious pulmonary nodule or mass. No focal airspace consolidation. No pleural effusion. Upper Abdomen: Visualized portion of the upper abdomen shows no acute findings. Musculoskeletal: No worrisome lytic or sclerotic osseous abnormality. IMPRESSION: 1. Stable exam. No new or progressive findings to suggest recurrent or metastatic disease in the chest. 2. Stable right upper lobe collapse. 3. Aortic Atherosclerosis (ICD10-I70.0) and Emphysema (ICD10-J43.9). Electronically Signed   By: Kennith Center M.D.   On: 03/23/2023 11:53   DG Chest 2 View Result Date: 03/08/2023 CLINICAL DATA:  hemoptysis, cough, COPD, hx/o lung cancer  EXAM: CHEST - 2 VIEW COMPARISON:  Chest x-ray 09/23/2021, CT chest 11/14/2022 FINDINGS: The heart and mediastinal contours are unchanged. Aortic calcification. Hyperinflation of the lungs. No focal consolidation. No pulmonary edema. No pleural effusion. No pneumothorax. No acute osseous abnormality. IMPRESSION: 1. No active cardiopulmonary disease. 2. Aortic Atherosclerosis (ICD10-I70.0) and Emphysema (ICD10-J43.9). Electronically Signed   By: Tish Frederickson M.D.   On: 03/08/2023 17:23    ASSESSMENT AND PLAN: This is a very pleasant 67 years old white female recently  diagnosed with a stage IIIA (TX, N2, M0) non-small cell lung cancer, squamous cell carcinoma presented with subcarinal mass/lymphadenopathy diagnosed in July 2022. The patient underwent a course of concurrent chemoradiation with weekly carboplatin for AUC of 2 and paclitaxel 45 Mg/M2 status post 7 cycles. The patient tolerated her treatment well except for fatigue and sore throat. The patient completed a course of consolidation treatment with immunotherapy with Imfinzi 1500 Mg IV every 4 weeks status post 13 cycles.  The patient is currently on observation with no concerning complaints except for occasional hemoptysis.  It is decreasing in frequency and amount. She had repeat CT scan of the chest performed recently.  I personally and independently reviewed the scan images and discussed the result with the patient today.  Her scan showed no concerning findings for disease recurrence or progression but she continues to have stable collapse of the right upper lobe.    Stage IIIA Non-Small Cell Lung Cancer (NSCLC), Squamous Cell Carcinoma Diagnosed July 2022. Completed chemotherapy, radiation, and one year of Imfinzi immunotherapy by September 2023. Recent scan shows stable disease with no new growth or spread. Small collapsed area in the right upper lobe from previous radiation. Reports intermittent hemoptysis, initially bright red, now less frequent and less bright. No headaches, vision changes, nausea, vomiting, or weight loss. Discussed monitoring for fresh, bright blood in sputum and need for immediate medical attention if it occurs. If hemoptysis worsens, referral to pulmonologist for bronchoscopy to check for endobronchial lesions. - Monitor for fresh, bright blood in sputum and seek immediate medical attention if it occurs - Refer to pulmonologist for bronchoscopy if hemoptysis worsens - Provide scan and lung cancer research materials - Schedule follow-up in six months  General Health  Maintenance Active and maintaining weight. No new health issues reported. Pulmonary follow-up with Dr. Tonia Brooms or partner if needed. - Encourage continued physical activity - Ensure cardiologist follow-up if necessary  Follow-up - Schedule follow-up appointment in six months - Advise to contact clinic sooner if new symptoms develop.     For the anemia, she will continue with the oral iron tablets with vitamin C. For the hypothyroidism she is currently on levothyroxine 125 mcg p.o. daily.  The patient voices understanding of current disease status and treatment options and is in agreement with the current care plan.  All questions were answered. The patient knows to call the clinic with any problems, questions or concerns. We can certainly see the patient much sooner if necessary.  Disclaimer: This note was dictated with voice recognition software. Similar sounding words can inadvertently be transcribed and may not be corrected upon review.

## 2023-03-27 ENCOUNTER — Telehealth (INDEPENDENT_AMBULATORY_CARE_PROVIDER_SITE_OTHER): Payer: Medicare Other | Admitting: Medical

## 2023-03-27 VITALS — Wt 127.0 lb

## 2023-03-27 DIAGNOSIS — J449 Chronic obstructive pulmonary disease, unspecified: Secondary | ICD-10-CM | POA: Diagnosis not present

## 2023-03-27 DIAGNOSIS — Z87891 Personal history of nicotine dependence: Secondary | ICD-10-CM | POA: Diagnosis not present

## 2023-03-27 MED ORDER — DOXYCYCLINE HYCLATE 100 MG PO TABS: 100.0000 mg | ORAL_TABLET | Freq: Two times a day (BID) | ORAL | 0 refills | Status: DC

## 2023-03-27 MED ORDER — IPRATROPIUM-ALBUTEROL 0.5-2.5 (3) MG/3ML IN SOLN: 3.0000 mL | Freq: Four times a day (QID) | RESPIRATORY_TRACT | 1 refills | Status: DC | PRN

## 2023-03-27 NOTE — Progress Notes (Signed)
Subjective:     Patient ID: Katrina Campbell, female   DOB: 06/08/1955, 67 y.o.   MRN: [id number]  This visit type was conducted due to national recommendations for restrictions regarding the COVID-19 Pandemic (e.g. social distancing) in an effort to limit this patient's exposure and mitigate transmission in our community.  Due to their co-morbid illnesses, this patient is at least at moderate risk for complications without adequate follow up.  This format is felt to be most appropriate for this patient at this time.    Documentation for virtual audio and video telecommunications through [place] encounter:  The patient was located at home. The provider was located in the office. The patient did consent to this visit and is aware of possible charges through their insurance for this visit.  The other persons participating in this telemedicine service were none. Time spent on call was 20 minutes and in review of previous records 20 minutes total.  This virtual service is not related to other E/M service within previous 7 days.   HPI Chief Complaint  Patient presents with   COPD Attacks    COPD attacks. Symptoms- no energy, SOB, coughing and laying down causes wheezing , mucous is green   Virtual consult for COPD flare.  She felt fine last week, had an oncology follow-up last week.  However 2 days ago started feeling really bad like someone hit her with a hammer.  Body aches, shortness of breath, coughing, weakness.  Wheezing.  She does feel better today and does not have near the weakness in the aches but does still feel wheezing and shortness of breath, fatigue, colored mucus.  She started some leftover doxycycline she had leftover and that does seem to be helping.  She is using her albuterol, using her Trelegy.  She had a recent scan and fortunately there was no new findings given the recent hemoptysis.  Oxygen at home has been consistently 97% on room air.  She is not on  oxygen.   Review of Systems As in subjective    Past Medical History:  Diagnosis Date   Anxiety    COPD (chronic obstructive pulmonary disease) (HCC)    Coronary artery disease    Dental staining 07/15/2019   Depression    History of radiation therapy    Right lung- 11/03/20-12/15/20- Dr. 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     albuterol (PROVENTIL) (2.5 MG/3ML) 0.083% nebulizer solution INHALE 3 MLS (2.5 MG TOTAL) VIA NEBULIZER EVERY 6 HOURS AS NEEDED FOR WHEEZING OR SHORTNESS OF BREATH 540 mL 7   albuterol (VENTOLIN HFA) 108 (90 Base) MCG/ACT inhaler Inhale 2 puffs into the lungs every 6 (six) hours as needed for wheezing or shortness of breath.     aspirin EC 81 MG tablet Take 81 mg by mouth daily with lunch.      Cholecalciferol (DIALYVITE VITAMIN D 5000) 125 MCG (5000 UT) capsule Take 5,000 Units by mouth daily.     esomeprazole (NEXIUM) 40 MG capsule TAKE 1 CAPSULE DAILY 90 capsule 1   ferrous sulfate 325 (65 FE) MG tablet Take 325 mg by mouth every other day.  Fluticasone-Umeclidin-Vilant (TRELEGY ELLIPTA) 100-62.5-25 MCG/ACT AEPB USE 1 INHALATION DAILY 180 each 3   Guaifenesin (MUCINEX MAXIMUM STRENGTH) 1200 MG TB12 Take 1,200 mg by mouth 2 (two) times daily.     levothyroxine (SYNTHROID) 88 MCG tablet TAKE 1 TABLET(88 MCG) BY MOUTH DAILY 30 tablet 2   methocarbamol (ROBAXIN) 500 MG tablet Take 1 tablet (500 mg total) by mouth 2 (two) times daily as needed for muscle spasms. 20 tablet 0   Multiple Vitamins-Minerals (IMMUNE SUPPORT PO) Take 2 tablets by mouth daily.     Respiratory Therapy Supplies (FLUTTER) DEVI Use as directed 1 each 0    rosuvastatin (CRESTOR) 20 MG tablet Take 1 tablet (20 mg total) by mouth daily. 90 tablet 2   temazepam (RESTORIL) 15 MG capsule TAKE 1 CAPSULE AT BEDTIME AS NEEDED 90 capsule 0   vitamin B-12 (CYANOCOBALAMIN) 1000 MCG tablet Take 1,000 mcg by mouth daily.     Current Facility-Administered Medications on File Prior to Visit  Medication Dose Route Frequency Provider Last Rate Last Admin   Sonafine emulsion 1 application  1 application  Topical Once Antony Blackbird, MD         Objective:   Physical Exam Due to coronavirus pandemic stay at home measures, patient visit was virtual and they were not examined in person.   Wt 127 lb (57.6 kg)   SpO2 99%   BMI 21.80 kg/m   Gen: wd, wn, nad No obvious wheezing or labored breathing today      Assessment:     Encounter Diagnosis  Name Primary?   Chronic obstructive pulmonary disease, unspecified COPD type (HCC) Yes       Plan:     Begin 5 to 7 days on doxycycline.  Rest, hydrate well, continue Mucinex DM.  Change from plain albuterol to DuoNeb for nebulizer, and lets see how this works in place of albuterol plain.  Continue Trelegy as usual.  We discussed that she has another flareup in the next month or 2 and I would recommend follow-up with pulmonology to discuss whether she needs to be on prophylactic antibiotic or other treatment  Thankfully her recent CT chest from 03/17/2023 did not show any new recurrent or metastatic disease  Khalaya was seen today for copd attacks.  Diagnoses and all orders for this visit:  Chronic obstructive pulmonary disease, unspecified COPD type (HCC)  Other orders -     ipratropium-albuterol (DUONEB) 0.5-2.5 (3) MG/3ML SOLN; Take 3 mLs by nebulization every 6 (six) hours as needed. -     doxycycline (VIBRA-TABS) 100 MG tablet; Take 1 tablet (100 mg total) by mouth 2 (two) times daily.    F/u prn

## 2023-03-30 ENCOUNTER — Other Ambulatory Visit: Payer: Self-pay | Admitting: Pulmonary Disease

## 2023-03-30 DIAGNOSIS — J449 Chronic obstructive pulmonary disease, unspecified: Secondary | ICD-10-CM

## 2023-04-14 ENCOUNTER — Other Ambulatory Visit: Payer: Self-pay | Admitting: Pulmonary Disease

## 2023-04-18 ENCOUNTER — Encounter: Payer: Self-pay | Admitting: Pulmonary Disease

## 2023-05-10 ENCOUNTER — Other Ambulatory Visit: Payer: Medicare Other

## 2023-05-11 ENCOUNTER — Ambulatory Visit: Payer: Medicare Other | Admitting: Pulmonary Disease

## 2023-05-17 ENCOUNTER — Ambulatory Visit: Payer: Medicare Other | Admitting: Internal Medicine

## 2023-05-17 ENCOUNTER — Other Ambulatory Visit: Payer: Self-pay | Admitting: Medical

## 2023-05-18 ENCOUNTER — Other Ambulatory Visit: Payer: Self-pay

## 2023-05-18 ENCOUNTER — Other Ambulatory Visit (HOSPITAL_COMMUNITY): Payer: Self-pay

## 2023-05-18 MED ORDER — ROSUVASTATIN CALCIUM 20 MG PO TABS
20.0000 mg | ORAL_TABLET | Freq: Every day | ORAL | 0 refills | Status: DC
  Filled 2023-05-18: qty 30, 30d supply, fill #0

## 2023-05-19 ENCOUNTER — Telehealth: Payer: Self-pay | Admitting: Cardiology

## 2023-05-19 ENCOUNTER — Encounter: Payer: Self-pay | Admitting: Cardiology

## 2023-05-19 NOTE — Telephone Encounter (Signed)
*  STAT* If patient is at the pharmacy, call can be transferred to refill team.   1. Which medications need to be refilled? (please list name of each medication and dose if known)   rosuvastatin (CRESTOR) 20 MG tablet     4. Which pharmacy/location (including street and city if local pharmacy) is medication to be sent to? [facility] - [place], [place] - [address] Phone: [phone]  Fax: [phone]       5. Do they need a 30 day or 90 day supply? 90  Pt scheduled 06/15/23 with Dr. Odis Hollingshead

## 2023-05-20 ENCOUNTER — Other Ambulatory Visit: Payer: Self-pay

## 2023-05-20 MED ORDER — ROSUVASTATIN CALCIUM 20 MG PO TABS: 20.0000 mg | ORAL_TABLET | Freq: Every day | ORAL | 3 refills | Status: AC

## 2023-05-22 ENCOUNTER — Other Ambulatory Visit (HOSPITAL_COMMUNITY): Payer: Self-pay

## 2023-05-31 ENCOUNTER — Ambulatory Visit (INDEPENDENT_AMBULATORY_CARE_PROVIDER_SITE_OTHER): Payer: Medicare Other | Admitting: Emergency Medicine

## 2023-05-31 ENCOUNTER — Encounter: Payer: Self-pay | Admitting: Emergency Medicine

## 2023-05-31 VITALS — BP 150/80 | HR 75 | Temp 98.2°F | Ht 64.0 in | Wt 140.2 lb

## 2023-05-31 DIAGNOSIS — A310 Pulmonary mycobacterial infection: Secondary | ICD-10-CM

## 2023-05-31 DIAGNOSIS — J479 Bronchiectasis, uncomplicated: Secondary | ICD-10-CM | POA: Diagnosis not present

## 2023-05-31 DIAGNOSIS — J449 Chronic obstructive pulmonary disease, unspecified: Secondary | ICD-10-CM | POA: Diagnosis not present

## 2023-05-31 DIAGNOSIS — C3491 Malignant neoplasm of unspecified part of right bronchus or lung: Secondary | ICD-10-CM

## 2023-05-31 NOTE — Progress Notes (Signed)
 Subjective:    Patient ID: Katrina Campbell, female    DOB: 29-Sep-1955, 68 y.o.   MRN: [id number]  HPI 68 year old woman with a history of former tobacco use, COPD, chronic right upper lobe atelectasis and Mycobacterium avium (treated x 1 yr), Bjerkandera adusta who has been followed in our office previously by Dr. Kriste Basque and then Dr. Tonia Brooms.  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  She is currently on observation.  She has been experiencing intermittent cough with hemoptysis her most recent CT chest showed no evidence of disease recurrence on 03/17/2023.  There is chronic stable right upper lobe atelectasis. Currently managed on Trelegy, uses albuterol about 2x a month. She had a flare and required abx in December.  She has occasional cough. She does use her chest vest + nebs bid, flutter prn. Can get some mucous out with this, but not a lot. She is able to do the treadmill. Rarely uses any extra albuterol. Some days she has very low energy - she is having her thyroid meds adjusted.   CT scan of the chest 03/17/2023 reviewed by me, shows no evidence of disease recurrence.  Stable bronchiectasis without any evidence of groundglass or tree-in-bud nodularity   Review of Systems As per HPI  Past Medical History:  Diagnosis Date   Anxiety    COPD (chronic obstructive pulmonary disease) (HCC)    Coronary artery disease    Dental staining 07/15/2019   Depression    History of radiation therapy    Right lung- 11/03/20-12/15/20- Dr. 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     Family History  Problem Relation Age of Onset   Heart attack Mother    Emphysema Mother    Congestive Heart Failure Mother    Heart attack Father    Congestive Heart Failure Father    Asthma Other    Lung disease  Sister    Lung disease Brother    Colon cancer Neg Hx      Social History   Socioeconomic History   Marital status: Married    Spouse name: Not on file   Number of children: Not on file   Years of education: Not on file   Highest education level: Not on file  Occupational History   Not on file  Tobacco Use   Smoking status: Former    Current packs/day: 0.00    Average packs/day: 1.8 packs/day for 44.0 years (77.0 ttl pk-yrs)    Types: Cigarettes    Start date: 11/09/1968    Quit date: 11/09/2012    Years since quitting: 10.5   Smokeless tobacco: Never   Tobacco comments:    vapor  Vaping Use   Vaping status: Former  Substance and Sexual Activity   Alcohol use: Not Currently    Alcohol/week: 24.0 standard drinks of alcohol    Types: 24 Cans of beer per week   Drug use: Not Currently   Sexual activity: Not Currently  Other Topics Concern   Not on file  Social History Narrative   Not on file   Social Drivers of Health   Financial Resource Strain: Medium Risk (10/29/2020)   Overall Financial Resource Strain (CARDIA)    Difficulty of Paying Living Expenses: Somewhat hard  Food Insecurity: Low Risk  (03/10/2023)  Received from [facility]   Hunger Vital Sign    Worried About Running Out of Food in the Last Year: Never true    Ran Out of Food in the Last Year: Never true  Transportation Needs: No Transportation Needs (03/10/2023)   Received from [facility]    In the past 12 months, has lack of reliable transportation kept you from medical appointments, meetings, work or from getting things needed for daily living? : No  Physical Activity: Sufficiently Active (11/22/2022)   Exercise Vital Sign    Days of Exercise per Week: 7 days    Minutes of Exercise per Session: 30 min  Stress: Stress Concern Present (11/22/2022)   [employer] of Occupational Health - Occupational Stress Questionnaire    Feeling of Stress : To some extent  Social Connections:  Moderately Integrated (11/22/2022)   Social Connection and Isolation Panel [NHANES]    Frequency of Communication with Friends and Family: More than three times a week    Frequency of Social Gatherings with Friends and Family: Once a week    Attends Religious Services: More than 4 times per year    Active Member of [employer] or Organizations: No    Attends Banker Meetings: Never    Marital Status: Living with partner  Intimate Partner Violence: Not At Risk (11/22/2022)   Humiliation, Afraid, Rape, and Kick questionnaire    Fear of Current or Ex-Partner: No    Emotionally Abused: No    Physically Abused: No    Sexually Abused: No     Allergies  Allergen Reactions   Breztri Aerosphere [Budeson-Glycopyrrol-Formoterol] Shortness Of Breath   Benadryl [Diphenhydramine] Nausea Only   Codeine Other (See Comments)    Causes pt to blackout   Prednisone Other (See Comments)    Passed out   Statins     Myalgias at higher doses     Outpatient Medications Prior to Visit  Medication Sig Dispense Refill   acetaminophen (TYLENOL) 500 MG tablet Take 500 mg by mouth every 6 (six) hours as needed for moderate pain or headache.     albuterol (PROVENTIL) (2.5 MG/3ML) 0.083% nebulizer solution INHALE 3 MLS (2.5 MG TOTAL) VIA NEBULIZER EVERY 6 HOURS AS NEEDED FOR WHEEZING OR SHORTNESS OF BREATH 540 mL 7   albuterol (VENTOLIN HFA) 108 (90 Base) MCG/ACT inhaler Inhale 2 puffs into the lungs every 6 (six) hours as needed for wheezing or shortness of breath.     aspirin EC 81 MG tablet Take 81 mg by mouth daily with lunch.      Cholecalciferol (DIALYVITE VITAMIN D 5000) 125 MCG (5000 UT) capsule Take 5,000 Units by mouth daily.     doxycycline (VIBRA-TABS) 100 MG tablet Take 1 tablet (100 mg total) by mouth 2 (two) times daily. 20 tablet 0   esomeprazole (NEXIUM) 40 MG capsule TAKE 1 CAPSULE DAILY 90 capsule 1   ferrous sulfate 325 (65 FE) MG tablet Take 325 mg by mouth every other day.      Guaifenesin (MUCINEX MAXIMUM STRENGTH) 1200 MG TB12 Take 1,200 mg by mouth 2 (two) times daily.     ipratropium-albuterol (DUONEB) 0.5-2.5 (3) MG/3ML SOLN Take 3 mLs by nebulization every 6 (six) hours as needed. 75 mL 1   levothyroxine (SYNTHROID) 88 MCG tablet TAKE 1 TABLET(88 MCG) BY MOUTH DAILY 30 tablet 2   methocarbamol (ROBAXIN) 500 MG tablet Take 1 tablet (500 mg total) by mouth 2 (two) times daily as needed for  muscle spasms. 20 tablet 0   Multiple Vitamins-Minerals (IMMUNE SUPPORT PO) Take 2 tablets by mouth daily.     Respiratory Therapy Supplies (FLUTTER) DEVI Use as directed 1 each 0   rosuvastatin (CRESTOR) 20 MG tablet Take 1 tablet (20 mg total) by mouth daily. Must keep scheduled appointment for future refills. Thank you. 90 tablet 3   temazepam (RESTORIL) 15 MG capsule TAKE 1 CAPSULE AT BEDTIME AS NEEDED 90 capsule 0   TRELEGY ELLIPTA 100-62.5-25 MCG/ACT AEPB USE 1 INHALATION DAILY 180 each 3   vitamin B-12 (CYANOCOBALAMIN) 1000 MCG tablet Take 1,000 mcg by mouth daily.     Facility-Administered Medications Prior to Visit  Medication Dose Route Frequency Provider Last Rate Last Admin   Sonafine emulsion 1 application  1 application  Topical Once Antony Blackbird, MD            Objective:   Physical Exam Vitals:   05/31/23 1404  BP: (!) 150/80  Pulse: 75  Temp: 98.2 F (36.8 C)  TempSrc: Oral  SpO2: 98%  Weight: 140 lb 3.2 oz (63.6 kg)  Height: 5\' 4"  (1.626 m)   Gen: Pleasant, well-nourished, in no distress,  normal affect  ENT: No lesions,  mouth clear,  oropharynx clear, no postnasal drip  Neck: No JVD, no stridor  Lungs: No use of accessory muscles, coarse bilaterally, few scattered rhonchi, end expiratory wheeze, no crackles  Cardiovascular: RRR, heart sounds normal, no murmur or gallops, no peripheral edema  Musculoskeletal: No deformities, no cyanosis or clubbing  Neuro: alert, awake, non focal  Skin: Warm, no lesions or rash      Assessment &  Plan:  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  Continue surveillance of clinical status, CT imaging  Bronchiectasis without complication (HCC) You could consider decreasing your chest vest and nebulizer treatments to once daily to see if this is adequate to help you keep your chest clear of mucus.  If you find that you have increased mucus with this change and you can certainly go back to twice daily. Use your Acapella valve as needed for cough and mucus clearance Follow with Dr Delton Coombes in 6 months or sooner if you have any problems  Stage 2 moderate COPD by GOLD classification (HCC) Please continue your Trelegy once daily.  Rinse and gargle after using. Keep your albuterol available use 2 puffs when needed for shortness of breath, chest tightness, wheezing.  Stage III squamous cell carcinoma of right lung [facility]) Get your repeat imaging and follow-up with Dr. Arbutus Ped as planned   Levy Pupa, MD, PhD 05/31/2023, 3:40 PM [facility] [phone] or if no answer before 7:00PM call [phone] For any issues after 7:00PM please call eLink [phone]

## 2023-05-31 NOTE — Assessment & Plan Note (Signed)
 You could consider decreasing your chest vest and nebulizer treatments to once daily to see if this is adequate to help you keep your chest clear of mucus.  If you find that you have increased mucus with this change and you can certainly go back to twice daily. Use your Acapella valve as needed for cough and mucus clearance Follow with Dr Delton Coombes in 6 months or sooner if you have any problems

## 2023-05-31 NOTE — Assessment & Plan Note (Signed)
 Get your repeat imaging and follow-up with Dr. Arbutus Ped as planned

## 2023-05-31 NOTE — Assessment & Plan Note (Signed)
 Please continue your Trelegy once daily.  Rinse and gargle after using. Keep your albuterol available use 2 puffs when needed for shortness of breath, chest tightness, wheezing.

## 2023-05-31 NOTE — Patient Instructions (Signed)
 Please continue your Trelegy once daily.  Rinse and gargle after using. Keep your albuterol available use 2 puffs when needed for shortness of breath, chest tightness, wheezing. You could consider decreasing your chest vest and nebulizer treatments to once daily to see if this is adequate to help you keep your chest clear of mucus.  If you find that you have increased mucus with this change and you can certainly go back to twice daily. Use your Acapella valve as needed for cough and mucus clearance Get your repeat imaging and follow-up with Dr. Arbutus Ped as planned Follow with Dr Delton Coombes in 6 months or sooner if you have any problems

## 2023-05-31 NOTE — Assessment & Plan Note (Signed)
 Based on her most recent surveillance culture data it appears that this has been eradicated.  Continue surveillance of clinical status, CT imaging

## 2023-06-06 ENCOUNTER — Other Ambulatory Visit (HOSPITAL_COMMUNITY): Payer: Self-pay

## 2023-06-06 ENCOUNTER — Other Ambulatory Visit: Payer: Self-pay | Admitting: Medical

## 2023-06-06 ENCOUNTER — Telehealth: Payer: Self-pay | Admitting: Emergency Medicine

## 2023-06-06 MED ORDER — ALBUTEROL SULFATE HFA 108 (90 BASE) MCG/ACT IN AERS
2.0000 | INHALATION_SPRAY | Freq: Four times a day (QID) | RESPIRATORY_TRACT | 1 refills | Status: DC | PRN
  Filled 2023-06-06: qty 18, fill #0

## 2023-06-06 NOTE — Telephone Encounter (Signed)
 Patient states Albuterol inhaler needs to go to [facility]. Needs to go thru Dr. Delton Coombes. Patient phone number is [phone].

## 2023-06-07 ENCOUNTER — Other Ambulatory Visit: Payer: Self-pay

## 2023-06-07 MED ORDER — ALBUTEROL SULFATE HFA 108 (90 BASE) MCG/ACT IN AERS: 2.0000 | INHALATION_SPRAY | Freq: Four times a day (QID) | RESPIRATORY_TRACT | 1 refills | Status: DC | PRN

## 2023-06-07 NOTE — Telephone Encounter (Signed)
 We can send it for her and refill through [facility].  Thank you

## 2023-06-07 NOTE — Telephone Encounter (Signed)
 I called and spoke to pt. Pt states she needs her albuterol inhaler sent to [facility]. She needs a refill on her Albuterol inhaler but it looks like her PCP prescribes this. Pt states she was told to see Dr. Delton Coombes about anything related to her lungs. Please advise.

## 2023-06-15 ENCOUNTER — Encounter: Payer: Self-pay | Admitting: Cardiology

## 2023-06-15 ENCOUNTER — Ambulatory Visit: Payer: Medicare Other | Attending: Cardiology | Admitting: Cardiology

## 2023-06-15 VITALS — BP 136/82 | HR 67 | Ht 64.0 in | Wt 140.4 lb

## 2023-06-15 DIAGNOSIS — I77819 Aortic ectasia, unspecified site: Secondary | ICD-10-CM | POA: Diagnosis present

## 2023-06-15 DIAGNOSIS — C801 Malignant (primary) neoplasm, unspecified: Secondary | ICD-10-CM | POA: Diagnosis present

## 2023-06-15 DIAGNOSIS — I2510 Atherosclerotic heart disease of native coronary artery without angina pectoris: Secondary | ICD-10-CM | POA: Insufficient documentation

## 2023-06-15 DIAGNOSIS — E782 Mixed hyperlipidemia: Secondary | ICD-10-CM | POA: Insufficient documentation

## 2023-06-15 NOTE — Patient Instructions (Signed)
 Medication Instructions:  Your physician recommends that you continue on your current medications as directed. Please refer to the Current Medication list given to you today.  *If you need a refill on your cardiac medications before your next appointment, please call your pharmacy*   Lab Work: Fasting lipid panel and complete metabolic panel at [facility]   If you have labs (blood work) drawn today and your tests are completely normal, you will receive your results only by: MyChart Message (if you have MyChart) OR A paper copy in the mail If you have any lab test that is abnormal or we need to change your treatment, we will call you to review the results.   Testing/Procedures: NONE   Follow-Up: At [facility], you and your health needs are our priority.  As part of our continuing mission to provide you with exceptional heart care, we have created designated Provider Care Teams.  These Care Teams include your primary Cardiologist (physician) and Advanced Practice Providers (APPs -  Physician Assistants and Nurse Practitioners) who all work together to provide you with the care you need, when you need it.   Your next appointment:   1 year(s)  Provider:   Tessa Lerner, DO

## 2023-06-15 NOTE — Progress Notes (Signed)
 Cardiology Office Note:  .   Date:  06/15/2023  ID:  Katrina [facility], DOB April 06, 1955, MRN [id number] PCP:  Genia Del  Former Cardiology Providers: Dr. Delton See, Dr. Lowella [facility] Providers Cardiologist:  Tessa Lerner, DO , [facility] (established care 06/15/23) Electrophysiologist:  None  Click to update primary MD,subspecialty MD or APP then REFRESH:1}    Chief Complaint  Patient presents with   Follow-up    reestablish care, CAD    History of Present Illness: .   Katrina [facility] is a 68 y.o. Caucasian female whose past medical history and cardiovascular risk factors includes: CAD, dilated ascending aorta, pulmonary mycobacterium avium complex, HLD, COPD and stage III NSCLC on chemo.  Formally under the care of Dr. Shari Prows  who last saw Katrina [facility] back in March 2024. I am seeing her for the first time to re-establishing care.   From progress note 06/13/2022 Patient was admitted to the [facility] on 11/06/2017 with complaints of sudden onset of chest pain and dyspnea approximately 10 minutes after eating a carrot.  She was given nitroglycerin sublingually and apparently had about 30 seconds of syncope afterward.  Her systolic blood pressure dropped from 158 to 60.  She had a mild troponin elevation of 0.07 and a flat trend.  She underwent left heart cath on 11/07/2017 that showed mild nonobstructive CAD with 20% stenosis of her LAD and normal LV function.   Had coronary CTA on 03/03/20 for exertional chest pain with 25-49% distal LM, 25-49% prox LA, 25-49% prox-mid Lcx stenosis. Ca score 261 (90%). She was continued on medical management.    She was diagnosed with stage III NSCLC in 10/2020 for which she was started on carboplatin and paxclitaxel. TTE 12/24/20 with LVEF 55-60%, GLS -17%, normal RV, trivial MR, mild ascending aorta dilation 40mm.   TTE 12/2021 with normal LVEF 55-60%, normal GLS -23.4, normal RV, mild AI  Since last office visit patient states that she is doing  well from a cardiovascular standpoint.  She does not have anginal chest pain but does have dyspnea on exertion which is multifactorial however the dyspnea is also chronic and stable.  She denies orthopnea, PND, lower extremity swelling.  She completed her cancer treatment approximately 1 year ago with Dr. Shirline Frees and now she gets surveillance CT scans every 6 months.  Overall functional capacity remains relatively stable.  Review of Systems: .   Review of Systems  Cardiovascular:  Positive for dyspnea on exertion (chronic and stable). Negative for chest pain, claudication, irregular heartbeat, leg swelling, near-syncope, orthopnea, palpitations, paroxysmal nocturnal dyspnea and syncope.  Hematologic/Lymphatic: Negative for bleeding problem.    Studies Reviewed:   EKG: EKG Interpretation Date/Time:  Thursday June 15 2023 09:10:52 EDT Ventricular Rate:  63 PR Interval:  144 QRS Duration:  72 QT Interval:  436 QTC Calculation: 446 R Axis:   33  Text Interpretation: Normal sinus rhythm Consider Anterior infarct , age undetermined When compared with ECG of 14-Mar-2021 13:44, Since last tracing rate slower Confirmed by Tessa Lerner [phone] on 06/15/2023 9:33:02 AM  Echocardiogram: 12/10/2021  1. Left ventricular ejection fraction, by estimation, is 55 to 60%. The  left ventricle has normal function. The left ventricle has no regional  wall motion abnormalities. Left ventricular diastolic parameters are  consistent with Grade I diastolic  dysfunction (impaired relaxation). Elevated left ventricular end-diastolic  pressure. The average left ventricular global longitudinal strain is -23.4  %. The global longitudinal strain is normal.   2. Right  ventricular systolic function is normal. The right ventricular  size is normal.   3. The mitral valve is normal in structure. Trivial mitral valve  regurgitation. No evidence of mitral stenosis.   4. The aortic valve is tricuspid. Aortic valve  regurgitation is mild. No  aortic stenosis is present.   5. The inferior vena cava is normal in size with greater than 50%  respiratory variability, suggesting right atrial pressure of 3 mmHg.  6. Ao Root diam: 3.60 cm Ao Asc diam:  4.00 cm   Heart catheterization: August 2019: Conclusions: Eccentric calcification with mild stenosis of the ostial LAD (~20%).  Otherwise, no angiographically significant coronary artery disease. Normal left ventricular systolic function and filling pressure.   Recommendations: Continue medical therapy and risk factor modification to prevent progression of CAD. Monitor overnight to ensure the patient does not have recurrent chest pain.  Anticipate discharge home tomorrow morning.   Recommend Aspirin 81mg  daily for mild CAD and thoracic aortic aneurysm.   CCTA 03/03/2020: 1. Coronary calcium score of 261. This was 90 percentile for age and sex matched control.   2. Normal coronary origin with left dominance.   3. CAD-RADS 2. Mild non-obstructive CAD (25-49%) in the distal left main and proximal portions of LAD and LCX arteries. Consider non-atherosclerotic causes of dyspnea. Consider preventive therapy and risk factor modification.  4. Aortic Atherosclerosis (ICD10-I70.0) and Emphysema (ICD10-J43.9).   RADIOLOGY: CT Chest w/ contrast 03/23/2023 1. Stable exam. No new or progressive findings to suggest recurrent or metastatic disease in the chest. 2. Stable right upper lobe collapse. 3. Aortic Atherosclerosis (ICD10-I70.0) and Emphysema (ICD10-J43.9). 4. Ascending thoracic aorta measures 3.9 cm diameter.   Risk Assessment/Calculations:   NA   Labs:       Latest Ref Rng & Units 03/17/2023    1:08 PM 11/10/2022   10:37 AM 07/14/2022   12:17 PM  CBC  WBC 4.0 - 10.5 K/uL 6.3  4.6  5.8   Hemoglobin 12.0 - 15.0 g/dL 16.1  09.6  04.5   Hematocrit 36.0 - 46.0 % 40.2  37.8  39.7   Platelets 150 - 400 K/uL 141  118  136        Latest Ref Rng & Units  03/17/2023    1:08 PM 11/10/2022   10:37 AM 07/14/2022   12:17 PM  BMP  Glucose 70 - 99 mg/dL 81  86  93   BUN 8 - 23 mg/dL 10  12  14    Creatinine 0.44 - 1.00 mg/dL 4.09  8.11  9.14   Sodium 135 - 145 mmol/L 143  139  136   Potassium 3.5 - 5.1 mmol/L 4.4  4.1  4.1   Chloride 98 - 111 mmol/L 110  107  104   CO2 22 - 32 mmol/L 29  27  25    Calcium 8.9 - 10.3 mg/dL 9.4  8.9  9.3       Latest Ref Rng & Units 03/17/2023    1:08 PM 11/10/2022   10:37 AM 07/14/2022   12:17 PM  CMP  Glucose 70 - 99 mg/dL 81  86  93   BUN 8 - 23 mg/dL 10  12  14    Creatinine 0.44 - 1.00 mg/dL 7.82  9.56  2.13   Sodium 135 - 145 mmol/L 143  139  136   Potassium 3.5 - 5.1 mmol/L 4.4  4.1  4.1   Chloride 98 - 111 mmol/L 110  107  104  CO2 22 - 32 mmol/L 29  27  25    Calcium 8.9 - 10.3 mg/dL 9.4  8.9  9.3   Total Protein 6.5 - 8.1 g/dL 6.9  6.4  7.0   Total Bilirubin <1.2 mg/dL 0.4  0.4  0.4   Alkaline Phos 38 - 126 U/L 81  77  80   AST 15 - 41 U/L 31  24  24    ALT 0 - 44 U/L 38  29  29     Lab Results  Component Value Date   CHOL 157 06/13/2022   HDL 62 06/13/2022   LDLCALC 79 06/13/2022   TRIG 85 06/13/2022   CHOLHDL 2.5 06/13/2022   No results for input(s): "LIPOA" in the last 8760 hours. No components found for: "NTPROBNP" No results for input(s): "PROBNP" in the last 8760 hours. Recent Labs    07/14/22 1309 11/10/22 1033  TSH 1.230 4.289   Physical Exam:    Today's Vitals   06/15/23 0906  BP: 136/82  Pulse: 67  SpO2: 96%  Weight: 140 lb 6.4 oz (63.7 kg)  Height: 5\' 4"  (1.626 m)   Body mass index is 24.1 kg/m. Wt Readings from Last 3 Encounters:  06/15/23 140 lb 6.4 oz (63.7 kg)  05/31/23 140 lb 3.2 oz (63.6 kg)  03/27/23 127 lb (57.6 kg)    Physical Exam  Constitutional: No distress.  hemodynamically stable  Neck: No JVD present.  Cardiovascular: Normal rate, regular rhythm, S1 normal and S2 normal. Exam reveals no gallop, no S3 and no S4.  No murmur  heard. Pulmonary/Chest: Effort normal and breath sounds normal. No stridor. She has no wheezes. She has no rales.  Musculoskeletal:        General: No edema.     Cervical back: Neck supple.  Skin: Skin is warm.     Impression & Recommendation(s):  Impression:   ICD-10-CM   1. Coronary artery disease involving native coronary artery of native heart without angina pectoris  I25.10 EKG 12-Lead    Lipid panel    Comprehensive metabolic panel    Comprehensive metabolic panel    Lipid panel    2. Dilation of aorta (HCC)  I77.819     3. Mixed hyperlipidemia  E78.2 Lipid panel    Comprehensive metabolic panel    Comprehensive metabolic panel    Lipid panel    4. 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 EKG today is nonischemic. Heart catheterization August 2019: Mild CAD, reported thoracic aortic aneurysm. Coronary CTA November 2021: Total CAC 261, 90th percentile, mild nonobstructive CAD. Patient has completely stopped smoking. Reemphasized the importance of secondary prevention with focus on improving her modifiable cardiovascular risk factors such as glycemic control, lipid management, blood pressure control. Will check fasting lipid profile.  Dilation of aorta [facility]) Most recent CT chest December 2024 noted aortic dimensions to be 39 mm. Echocardiogram from September 2023 reported ascending aorta to be 40 mm Office blood pressures are well-controlled. Continue current medical therapy Will continue to monitor the dimensions as she is tentatively planning to have CT of the chest every 6 months for cancer surveillance  Mixed hyperlipidemia Rosuvastatin to currently on rosuvastatin 20 mg p.o. daily.   She denies myalgia or other side effects. Last cholesterol levels from March 2024 noted LDL at 79  mg/dL. No recent labs available for review. Will check fasting lipids. Based on the results we will determine if further medication titration is warranted.  Recommend a goal LDL <70 mg/dL and if possible <16 mg/dL   Malignant small cell cancer (HCC) Completed therapy/treatment about a year ago. Currently follows with hematology oncology for surveillance CT every 6 months   Orders Placed:  Orders Placed This Encounter  Procedures   Lipid panel    Standing Status:   Future    Number of Occurrences:   1    Expiration Date:   06/14/2024   Comprehensive metabolic panel    Standing Status:   Future    Number of Occurrences:   1    Expiration Date:   06/14/2024   EKG 12-Lead   Final Medication List:   No orders of the defined types were placed in this encounter.   Medications Discontinued During This Encounter  Medication Reason   doxycycline (VIBRA-TABS) 100 MG tablet    methocarbamol (ROBAXIN) 500 MG tablet      Current Outpatient Medications:    acetaminophen (TYLENOL) 500 MG tablet, Take 500 mg by mouth every 6 (six) hours as needed for moderate pain or headache., Disp: , Rfl:    albuterol (PROVENTIL) (2.5 MG/3ML) 0.083% nebulizer solution, INHALE 3 MLS (2.5 MG TOTAL) VIA NEBULIZER EVERY 6 HOURS AS NEEDED FOR WHEEZING OR SHORTNESS OF BREATH, Disp: 540 mL, Rfl: 7   albuterol (VENTOLIN HFA) 108 (90 Base) MCG/ACT inhaler, Inhale 2 puffs into the lungs every 6 (six) hours as needed for wheezing or shortness of breath., Disp: 18 g, Rfl: 1   aspirin EC 81 MG tablet, Take 81 mg by mouth daily with lunch. , Disp: , Rfl:    Cholecalciferol (DIALYVITE VITAMIN D 5000) 125 MCG (5000 UT) capsule, Take 5,000 Units by mouth daily., Disp: , Rfl:    esomeprazole (NEXIUM) 40 MG capsule, TAKE 1 CAPSULE DAILY, Disp: 90 capsule, Rfl: 1   ferrous sulfate 325 (65 FE) MG tablet, Take 325 mg by mouth every other day., Disp: , Rfl:    Guaifenesin (MUCINEX MAXIMUM STRENGTH) 1200 MG TB12, Take 1,200 mg by  mouth 2 (two) times daily., Disp: , Rfl:    ipratropium-albuterol (DUONEB) 0.5-2.5 (3) MG/3ML SOLN, Take 3 mLs by nebulization every 6 (six) hours as needed., Disp: 75 mL, Rfl: 1   levothyroxine (SYNTHROID) 88 MCG tablet, TAKE 1 TABLET(88 MCG) BY MOUTH DAILY, Disp: 30 tablet, Rfl: 2   Multiple Vitamins-Minerals (IMMUNE SUPPORT PO), Take 2 tablets by mouth daily., Disp: , Rfl:    Respiratory Therapy Supplies (FLUTTER) DEVI, Use as directed, Disp: 1 each, Rfl: 0   rosuvastatin (CRESTOR) 20 MG tablet, Take 1 tablet (20 mg total) by mouth daily. Must keep scheduled appointment for future refills. Thank you., Disp: 90 tablet, Rfl: 3   temazepam (RESTORIL) 15 MG capsule, TAKE 1 CAPSULE AT BEDTIME AS NEEDED, Disp: 90 capsule, Rfl: 0   TRELEGY ELLIPTA 100-62.5-25 MCG/ACT AEPB, USE 1 INHALATION DAILY, Disp: 180 each, Rfl: 3   vitamin B-12 (CYANOCOBALAMIN) 1000 MCG tablet, Take 1,000 mcg by mouth daily., Disp: , Rfl:  No current facility-administered medications for this visit.  Facility-Administered Medications Ordered in Other Visits:    Sonafine emulsion 1 application, 1 application , Topical, Once, Antony Blackbird, MD  Consent:   NA  Disposition:   1 year follow-up sooner if needed  Her questions and concerns were addressed to her satisfaction. She voices understanding of the recommendations provided  during this encounter.    Signed, Tessa Lerner, DO, [facility]  [facility]  [address] [place], [place] [address] 06/15/2023 10:11 AM

## 2023-06-16 ENCOUNTER — Other Ambulatory Visit: Payer: Self-pay | Admitting: Cardiology

## 2023-06-17 LAB — LAB REPORT - SCANNED: EGFR: 79

## 2023-06-22 ENCOUNTER — Encounter: Payer: Self-pay | Admitting: Cardiology

## 2023-06-25 NOTE — Telephone Encounter (Signed)
Please follow up .   Kaid Seeberger [place], DO, [facility]

## 2023-06-26 NOTE — Telephone Encounter (Signed)
 Spoke with [facility] over the phone and they had lab results on file for pt from 06/16/23. Asked [facility] to fax those results to our office. Awaiting fax.

## 2023-06-27 LAB — LIPID PANEL
Chol/HDL Ratio: 3 ratio (ref 0.0–4.4)
Cholesterol, Total: 177 mg/dL (ref 100–199)
HDL: 60 mg/dL (ref >39)
LDL Chol Calc (NIH): 95 mg/dL (ref 0–99)
Triglycerides: 124 mg/dL (ref 0–149)
VLDL Cholesterol Cal: 22 mg/dL (ref 5–40)

## 2023-06-27 LAB — COMPREHENSIVE METABOLIC PANEL
ALT: 36 IU/L — ABNORMAL HIGH (ref 0–32)
AST: 32 IU/L (ref 0–40)
Albumin: 4.3 g/dL (ref 3.9–4.9)
Alkaline Phosphatase: 111 IU/L (ref 44–121)
BUN/Creatinine Ratio: 21 (ref 12–28)
BUN: 17 mg/dL (ref 8–27)
Bilirubin Total: 0.4 mg/dL (ref 0.0–1.2)
CO2: 24 mmol/L (ref 20–29)
Calcium: 9.4 mg/dL (ref 8.7–10.3)
Chloride: 108 mmol/L — ABNORMAL HIGH (ref 96–106)
Creatinine, Ser: 0.81 mg/dL (ref 0.57–1.00)
Globulin, Total: 2.4 g/dL (ref 1.5–4.5)
Glucose: 90 mg/dL (ref 70–99)
Potassium: 4.4 mmol/L (ref 3.5–5.2)
Sodium: 144 mmol/L (ref 134–144)
Total Protein: 6.7 g/dL (ref 6.0–8.5)
eGFR: 79 mL/min/{1.73_m2} (ref >59)

## 2023-06-28 ENCOUNTER — Encounter: Payer: Self-pay | Admitting: Cardiology

## 2023-06-28 ENCOUNTER — Telehealth: Payer: Self-pay | Admitting: Internal Medicine

## 2023-06-28 ENCOUNTER — Ambulatory Visit (INDEPENDENT_AMBULATORY_CARE_PROVIDER_SITE_OTHER): Admitting: Family Medicine

## 2023-06-28 VITALS — BP 118/64 | HR 67 | Temp 98.1°F | Ht 64.0 in | Wt 140.8 lb

## 2023-06-28 DIAGNOSIS — F419 Anxiety disorder, unspecified: Secondary | ICD-10-CM

## 2023-06-28 DIAGNOSIS — F339 Major depressive disorder, recurrent, unspecified: Secondary | ICD-10-CM | POA: Diagnosis not present

## 2023-06-28 DIAGNOSIS — E785 Hyperlipidemia, unspecified: Secondary | ICD-10-CM

## 2023-06-28 DIAGNOSIS — Z8619 Personal history of other infectious and parasitic diseases: Secondary | ICD-10-CM

## 2023-06-28 DIAGNOSIS — C3491 Malignant neoplasm of unspecified part of right bronchus or lung: Secondary | ICD-10-CM

## 2023-06-28 DIAGNOSIS — E039 Hypothyroidism, unspecified: Secondary | ICD-10-CM | POA: Diagnosis not present

## 2023-06-28 DIAGNOSIS — I2510 Atherosclerotic heart disease of native coronary artery without angina pectoris: Secondary | ICD-10-CM

## 2023-06-28 DIAGNOSIS — Z0000 Encounter for general adult medical examination without abnormal findings: Secondary | ICD-10-CM

## 2023-06-28 DIAGNOSIS — Z85118 Personal history of other malignant neoplasm of bronchus and lung: Secondary | ICD-10-CM

## 2023-06-28 DIAGNOSIS — Z780 Asymptomatic menopausal state: Secondary | ICD-10-CM

## 2023-06-28 DIAGNOSIS — Z13820 Encounter for screening for osteoporosis: Secondary | ICD-10-CM

## 2023-06-28 NOTE — Telephone Encounter (Signed)
 FYI- I have taken you office as PCP   Copied from CRM [id number]. Topic: Appointments - Transfer of Care >> Jun 28, 2023 10:55 AM Marlow Baars wrote: Pt is requesting to transfer FROM: [place] [facility] Pt is requesting to transfer TO: [facility] Reason for requested transfer: She wants an MD to follow her now It is the responsibility of the team the patient would like to transfer to (Dr. Riley Nearing ultimately but has requested to see Kurtis Bushman before then  to reach out to the patient if for any reason this transfer is not acceptable.  Please assist patient further as she made an appt for this afternoon

## 2023-06-28 NOTE — Patient Instructions (Signed)
 It was great to meet you today and I'm excited to have you join the [facility] practice. I hope you had a positive experience today! If you feel so inclined, please feel free to recommend our practice to friends and family. Kurtis Bushman, FNP-C

## 2023-06-28 NOTE — Progress Notes (Unsigned)
 New Patient Office Visit  Subjective    Patient ID: Katrina Campbell, female    DOB: [birth year]  Age: 68 y.o. MRN: [id number]  CC:  Chief Complaint  Patient presents with   Establish Care    HPI Katrina Campbell presents to establish care. Oriented to practice routines and expectations. PMH includes anxiety, depression, COPD, CAD, HLD, HTN, hypothyroidism, SCC of lung, pulm mycobacterium avum complex. She does see cardiology, oncology, endocrinology, and pulmonology. Had cancer treatment with Dr Shirline Frees and continues surveillance CT scans every 6 months.  Breast CA screening: Mammogram status: Completed 11/08/2022. Repeat every year Cervical CA screening: approximate date 2018 and was normal Colon CA screening: colonoscopy 10 years ago without abnormalities. Due in May 2025 DEXA: DEXA scan ordered to evaluate the patient for osteoporosis Tobacco: former smoker, quit 2017 years ago Vaccines:  declines  HYPERTENSION without Chronic Kidney Disease Hypertension status: controlled  Satisfied with current treatment? yes Duration of hypertension: chronic BP monitoring frequency:   BP range:  BP medication side effects:  no Medication compliance: excellent compliance Previous BP meds: none Aspirin: yes Recurrent headaches: no Visual changes: no Palpitations: no Dyspnea: no Chest pain: no Lower extremity edema: no Dizzy/lightheaded: no  Per Cardiology 06/15/2023 for reference only: Recommendation(s):  Coronary artery disease involving native coronary artery of native heart without angina pectoris Denies anginal chest pain or heart failure symptoms. EKG today is nonischemic. Heart catheterization August 2019: Mild CAD, reported thoracic aortic aneurysm. Coronary CTA November 2021: Total CAC 261, 90th percentile, mild nonobstructive CAD. Patient has completely stopped smoking. Reemphasized the importance of secondary prevention with focus on improving her modifiable cardiovascular risk  factors such as glycemic control, lipid management, blood pressure control. Will check fasting lipid profile.   Dilation of aorta [facility]) Most recent CT chest December 2024 noted aortic dimensions to be 39 mm. Echocardiogram from September 2023 reported ascending aorta to be 40 mm Office blood pressures are well-controlled. Continue current medical therapy Will continue to monitor the dimensions as she is tentatively planning to have CT of the chest every 6 months for cancer surveillance   Mixed hyperlipidemia Rosuvastatin to currently on rosuvastatin 20 mg p.o. daily.   She denies myalgia or other side effects. Last cholesterol levels from March 2024 noted LDL at 79 mg/dL. No recent labs available for review. Will check fasting lipids. Based on the results we will determine if further medication titration is warranted.  Recommend a goal LDL <70 mg/dL and if possible <40 mg/dL    Malignant small cell cancer (HCC) Completed therapy/treatment about a year ago. 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  Continue surveillance of clinical status, CT imaging   Bronchiectasis without complication (HCC) You could consider decreasing your chest vest and nebulizer treatments to once daily to see if this is adequate to help you keep your chest clear of mucus.  If you find that you have increased mucus with this change and you can certainly go back to twice daily. Use your Acapella valve as needed for cough and mucus clearance Follow with Dr Delton Coombes in 6 months or sooner if you have any problems   Stage 2 moderate COPD by GOLD classification (HCC) Please continue your Trelegy once daily.  Rinse and gargle after using. Keep your albuterol available use 2 puffs  when  needed for shortness of breath, chest tightness, wheezing.   Stage III squamous cell carcinoma of right lung [facility]) Get your repeat imaging and follow-up with Dr. Arbutus Ped as planned  Per Endocrinology 03/09/2024 for reference only:  Hypothyroidism History of hypothyroidism diagnosed during treatment for Katrina Campbell which includes immunotherapy. Clinically appears euthyroid today; uncertain if increase appetite is a symptom from thyroid dysfunction. She will have labs drawn by oncologist next Friday - set a reminder for myself to follow up with patient re: labs then. - Continue levothyroxine at current dose for now.   History of Immunotherapy Previously treated with Imfinzi for SCC (now on observation). Given this, would have low threshold to screen for adrenal insufficiency (would expect secondary over primary), hypoparathyroidism, or other findings consistent with hypophysitis. Reviewed previous serum glucose and calcium levels which have mostly been within target range.       06/28/2023    2:58 PM 01/28/2022   10:17 AM 08/27/2021    1:31 PM 07/06/2021    8:46 AM 06/22/2021   10:54 AM  Depression screen PHQ 2/9  Decreased Interest 0 0 0 0 0  Down, Depressed, Hopeless 0  0 0 1  PHQ - 2 Score 0 0 0 0 1  Altered sleeping   1    Tired, decreased energy   1    Change in appetite   1    Feeling bad or failure about yourself    0    Trouble concentrating   0    Moving slowly or fidgety/restless   0    Suicidal thoughts   0    PHQ-9 Score   3    Difficult doing work/chores   Not difficult at all       Outpatient Encounter Medications as of 06/28/2023  Medication Sig   acetaminophen (TYLENOL) 500 MG tablet Take 500 mg by mouth every 6 (six) hours as needed for moderate pain or headache.   albuterol (PROVENTIL) (2.5 MG/3ML) 0.083% nebulizer solution INHALE 3 MLS (2.5 MG TOTAL) VIA NEBULIZER EVERY 6 HOURS AS NEEDED FOR WHEEZING OR SHORTNESS OF BREATH   albuterol (VENTOLIN HFA) 108 (90 Base) MCG/ACT  inhaler Inhale 2 puffs into the lungs every 6 (six) hours as needed for wheezing or shortness of breath.   aspirin EC 81 MG tablet Take 81 mg by mouth daily with lunch.    Cholecalciferol (DIALYVITE VITAMIN D 5000) 125 MCG (5000 UT) capsule Take 5,000 Units by mouth daily.   esomeprazole (NEXIUM) 40 MG capsule TAKE 1 CAPSULE DAILY   ferrous sulfate 325 (65 FE) MG tablet Take 325 mg by mouth every other day.   Guaifenesin (MUCINEX MAXIMUM STRENGTH) 1200 MG TB12 Take 1,200 mg by mouth 2 (two) times daily.   ipratropium-albuterol (DUONEB) 0.5-2.5 (3) MG/3ML SOLN Take 3 mLs by nebulization every 6 (six) hours as needed.   levothyroxine (SYNTHROID) 88 MCG tablet TAKE 1 TABLET(88 MCG) BY MOUTH DAILY   Multiple Vitamins-Minerals (IMMUNE SUPPORT PO) Take 2 tablets by mouth daily.   Respiratory Therapy Supplies (FLUTTER) DEVI Use as directed   rosuvastatin (CRESTOR) 20 MG tablet Take 1 tablet (20 mg total) by mouth daily. Must keep scheduled appointment for future refills. Thank you.   temazepam (RESTORIL) 15 MG capsule TAKE 1 CAPSULE AT BEDTIME AS NEEDED   TRELEGY ELLIPTA 100-62.5-25 MCG/ACT AEPB USE 1 INHALATION DAILY   vitamin B-12 (CYANOCOBALAMIN) 1000 MCG tablet Take 1,000 mcg by mouth daily.   Facility-Administered Encounter Medications as of 06/28/2023  Medication   Sonafine emulsion 1 application    Past Medical History:  Diagnosis Date   Anxiety    COPD (chronic obstructive pulmonary disease) (HCC)    Coronary artery disease    Dental staining 07/15/2019   Depression    History of radiation therapy    Right lung- 11/03/20-12/15/20- Dr. Antony Blackbird   Hyperlipidemia    Labile hypertension    Myocardial infarction [facility]) 2018   Pneumonia    Pulmonary mycobacterial infection (HCC) 10/24/2018    Past Surgical History:  Procedure Laterality Date   BALLOON DILATION N/A 04/29/2021   Procedure: BALLOON DILATION;  Surgeon: Rachael Fee, MD;  Location: [facility];  Service: Endoscopy;   Laterality: N/A;   BIOPSY  01/14/2021   Procedure: BIOPSY;  Surgeon: Josephine Igo, DO;  Location: [facility];  Service: Pulmonary;;   BRONCHIAL BIOPSY  10/08/2020   Procedure: BRONCHIAL BIOPSIES;  Surgeon: Josephine Igo, DO;  Location: [facility];  Service: Pulmonary;;   BRONCHIAL BRUSHINGS  10/08/2020   Procedure: BRONCHIAL BRUSHINGS;  Surgeon: Josephine Igo, DO;  Location: [facility];  Service: Pulmonary;;   BRONCHIAL BRUSHINGS  01/14/2021   Procedure: BRONCHIAL BRUSHINGS;  Surgeon: Josephine Igo, DO;  Location: [facility];  Service: Pulmonary;;   BRONCHIAL WASHINGS  10/08/2020   Procedure: BRONCHIAL WASHINGS;  Surgeon: Josephine Igo, DO;  Location: [facility];  Service: Pulmonary;;   BRONCHIAL WASHINGS  01/14/2021   Procedure: BRONCHIAL WASHINGS;  Surgeon: Josephine Igo, DO;  Location: [facility];  Service: Pulmonary;;   ESOPHAGOGASTRODUODENOSCOPY (EGD) WITH PROPOFOL N/A 04/29/2021   Procedure: ESOPHAGOGASTRODUODENOSCOPY (EGD) WITH PROPOFOL;  Surgeon: Rachael Fee, MD;  Location: [facility];  Service: Endoscopy;  Laterality: N/A;   FINE NEEDLE ASPIRATION  10/08/2020   Procedure: FINE NEEDLE ASPIRATION (FNA) LINEAR;  Surgeon: Josephine Igo, DO;  Location: [facility];  Service: Pulmonary;;   LEFT HEART CATH AND CORONARY ANGIOGRAPHY N/A 11/07/2017   Procedure: LEFT HEART CATH AND CORONARY ANGIOGRAPHY;  Surgeon: Yvonne Kendall, MD;  Location: [facility];  Service: Cardiovascular;  Laterality: N/A;   NASAL SINUS SURGERY  1986   VIDEO BRONCHOSCOPY Bilateral 01/29/2019   Procedure: VIDEO BRONCHOSCOPY WITH FLUORO;  Surgeon: Chilton Greathouse, MD;  Location: [facility];  Service: Cardiopulmonary;  Laterality: Bilateral;   VIDEO BRONCHOSCOPY Right 01/14/2021   Procedure: VIDEO BRONCHOSCOPY WITHOUT FLUORO;  Surgeon: Josephine Igo, DO;  Location: [facility];  Service: Pulmonary;  Laterality: Right;  possible cryotherapy   VIDEO BRONCHOSCOPY WITH  ENDOBRONCHIAL ULTRASOUND N/A 10/08/2020   Procedure: VIDEO BRONCHOSCOPY WITH ENDOBRONCHIAL ULTRASOUND;  Surgeon: Josephine Igo, DO;  Location: [facility];  Service: Pulmonary;  Laterality: N/A;    Family History  Problem Relation Age of Onset   Heart attack Mother    Emphysema Mother    Congestive Heart Failure Mother    Heart attack Father    Congestive Heart Failure Father    Asthma Other    Lung disease Sister    Lung disease Brother    Colon cancer Neg Hx     Social History   Socioeconomic History   Marital status: Married    Spouse name: Not on file   Number of children: Not on file   Years of education: Not on file   Highest education level: 12th grade  Occupational History   Not on file  Tobacco Use   Smoking status: Former    Current packs/day: 0.00    Average packs/day: 1.8  packs/day for 44.0 years (77.0 ttl pk-yrs)    Types: Cigarettes    Start date: 11/09/1968    Quit date: 11/09/2012    Years since quitting: 10.6   Smokeless tobacco: Never   Tobacco comments:    vapor  Vaping Use   Vaping status: Former  Substance and Sexual Activity   Alcohol use: Not Currently    Alcohol/week: 24.0 standard drinks of alcohol    Types: 24 Cans of beer per week   Drug use: Not Currently   Sexual activity: Not Currently  Other Topics Concern   Not on file  Social History Narrative   Not on file   Social Drivers of Health   Financial Resource Strain: Medium Risk (06/28/2023)   Overall Financial Resource Strain (CARDIA)    Difficulty of Paying Living Expenses: Somewhat hard  Food Insecurity: No Food Insecurity (06/28/2023)   Hunger Vital Sign    Worried About Running Out of Food in the Last Year: Never true    Ran Out of Food in the Last Year: Never true  Transportation Needs: No Transportation Needs (06/28/2023)   PRAPARE - Administrator, Civil Service (Medical): No    Lack of Transportation (Non-Medical): No  Physical Activity: Sufficiently Active  (06/28/2023)   Exercise Vital Sign    Days of Exercise per Week: 7 days    Minutes of Exercise per Session: 30 min  Stress: Stress Concern Present (06/28/2023)   [employer] of Occupational Health - Occupational Stress Questionnaire    Feeling of Stress : Rather much  Social Connections: Moderately Integrated (06/28/2023)   Social Connection and Isolation Panel [NHANES]    Frequency of Communication with Friends and Family: Three times a week    Frequency of Social Gatherings with Friends and Family: Twice a week    Attends Religious Services: More than 4 times per year    Active Member of [employer] or Organizations: No    Attends Banker Meetings: Never    Marital Status: Married  Catering manager Violence: Not At Risk (11/22/2022)   Humiliation, Afraid, Rape, and Kick questionnaire    Fear of Current or Ex-Partner: No    Emotionally Abused: No    Physically Abused: No    Sexually Abused: No    Review of Systems  Constitutional: Negative.   HENT: Negative.    Eyes: Negative.   Respiratory:  Positive for shortness of breath.   Cardiovascular: Negative.   Gastrointestinal: Negative.   Genitourinary: Negative.   Musculoskeletal: Negative.   Skin: Negative.   Neurological: Negative.   Endo/Heme/Allergies: Negative.   Psychiatric/Behavioral:  Positive for depression. Negative for suicidal ideas. The patient is nervous/anxious.   All other systems reviewed and are negative.       Objective    BP 118/64   Pulse 67   Temp 98.1 F (36.7 C)   Ht 5\' 4"  (1.626 m)   Wt 140 lb 12.8 oz (63.9 kg)   SpO2 98%   BMI 24.17 kg/m   Physical Exam Vitals and nursing note reviewed.  Constitutional:      Appearance: Normal appearance. She is normal weight.  HENT:     Head: Normocephalic and atraumatic.     Right Ear: Tympanic membrane, ear canal and external ear normal.     Left Ear: Tympanic membrane, ear canal and external ear normal.     Nose: Nose normal.      Mouth/Throat:     Mouth: Mucous membranes are  moist.     Pharynx: Oropharynx is clear.  Eyes:     Extraocular Movements: Extraocular movements intact.     Conjunctiva/sclera: Conjunctivae normal.     Pupils: Pupils are equal, round, and reactive to light.  Cardiovascular:     Rate and Rhythm: Normal rate and regular rhythm.     Pulses: Normal pulses.     Heart sounds: Normal heart sounds.  Pulmonary:     Effort: Pulmonary effort is normal.     Breath sounds: Examination of the left-upper field reveals decreased breath sounds. Examination of the left-lower field reveals decreased breath sounds. Decreased breath sounds present.  Abdominal:     General: Bowel sounds are normal.     Palpations: Abdomen is soft.  Musculoskeletal:        General: Normal range of motion.     Cervical back: Normal range of motion and neck supple.  Skin:    General: Skin is warm and dry.     Capillary Refill: Capillary refill takes less than 2 seconds.  Neurological:     General: No focal deficit present.     Mental Status: She is alert and oriented to person, place, and time. Mental status is at baseline.  Psychiatric:        Mood and Affect: Mood normal.        Behavior: Behavior normal.        Thought Content: Thought content normal.        Judgment: Judgment normal.     Last CBC Lab Results  Component Value Date   WBC 6.3 03/17/2023   HGB 13.5 03/17/2023   HCT 40.2 03/17/2023   MCV 89.7 03/17/2023   MCH 30.1 03/17/2023   RDW 13.0 03/17/2023   PLT 141 (L) 03/17/2023   Last metabolic panel Lab Results  Component Value Date   GLUCOSE 90 06/27/2023   NA 144 06/27/2023   K 4.4 06/27/2023   CL 108 (H) 06/27/2023   CO2 24 06/27/2023   BUN 17 06/27/2023   CREATININE 0.81 06/27/2023   EGFR 79 06/27/2023   CALCIUM 9.4 06/27/2023   PROT 6.7 06/27/2023   ALBUMIN 4.3 06/27/2023   LABGLOB 2.4 06/27/2023   AGRATIO 2.3 (H) 02/10/2020   BILITOT 0.4 06/27/2023   ALKPHOS 111 06/27/2023   AST 32  06/27/2023   ALT 36 (H) 06/27/2023   ANIONGAP 4 (L) 03/17/2023   Last lipids Lab Results  Component Value Date   CHOL 177 06/27/2023   HDL 60 06/27/2023   LDLCALC 95 06/27/2023   TRIG 124 06/27/2023   CHOLHDL 3.0 06/27/2023   Last hemoglobin A1c Lab Results  Component Value Date   HGBA1C 5.0 07/17/2013   Last thyroid functions Lab Results  Component Value Date   TSH 1.45 06/28/2023   Last vitamin D Lab Results  Component Value Date   VD25OH 73.3 09/22/2020   Last vitamin B12 and Folate Lab Results  Component Value Date   VITAMINB12 1,123 09/22/2020        Assessment & Plan:   Problem List Items Addressed This Visit     Anxiety   Declines therapy. Stressors include passing of her daugher and medical conditions. She manages well at home. Continue Restoril 15mg  nightly for sleep.      Hyperlipidemia   Continue Cresor 20mg  daily.  I recommend consuming a heart healthy diet such as Mediterranean diet or DASH diet with whole grains, fruits, vegetable, fish, lean meats, nuts, and olive oil. Limit sweets  and processed foods. I also encourage moderate intensity exercise 150 minutes weekly. This is 3-5 times weekly for 30-50 minutes each session. Goal should be pace of 3 miles/hours, or walking 1.5 miles in 30 minutes. The ASCVD Risk score (Arnett DK, et al., 2019) failed to calculate for the following reasons:   Risk score cannot be calculated because patient has a medical history suggesting prior/existing ASCVD       CAD (coronary artery disease)   Followed by Cardiology. Comorbid conditions well controlled including glucose, cholesterol, and blood pressure. No chest pain. CMP, lipids, TSH normal.   History per Cardiology: Heart catheterization August 2019: Mild CAD, reported thoracic aortic aneurysm. Coronary CTA November 2021: Total CAC 261, 90th percentile, mild nonobstructive CAD. Patient has completely stopped smoking.      Stage III squamous cell carcinoma of  right lung (HCC)   Followed by Pulm. Every 6 month surveillance CT showed eradication. Chest vest and nebs, Trelegy daily, Albuterol PRN.       Hypothyroidism   Euthyroid, normal TSH, Continue synthroid daily.      Relevant Orders   TSH (Completed)   RESOLVED: History of lung cancer   RESOLVED: History of MAC infection   Depression, recurrent (HCC)   Physical exam, annual - Primary   Today your medical history was reviewed and routine physical exam with labs was performed. Recommend 150 minutes of moderate intensity exercise weekly and consuming a well-balanced diet. Advised to stop smoking if a smoker, avoid smoking if a non-smoker, limit alcohol consumption to 1 drink per day for women and 2 drinks per day for men, and avoid illicit drug use. Counseled on safe sex practices and offered STI testing today. Counseled on the importance of sunscreen use. Counseled in mental health awareness and when to seek medical care. Vaccine maintenance discussed. Appropriate health maintenance items reviewed. Return to office in 1 year for annual physical exam.       Other Visit Diagnoses       Encounter for osteoporosis screening in asymptomatic postmenopausal patient       Relevant Orders   DG Bone Density       Return in about 3 months (around 09/28/2023) for ASAP AWV, chronic follow-up with labs 1 week prior.   Park Meo, FNP

## 2023-06-29 ENCOUNTER — Encounter: Payer: Self-pay | Admitting: Family Medicine

## 2023-06-29 DIAGNOSIS — F339 Major depressive disorder, recurrent, unspecified: Secondary | ICD-10-CM | POA: Insufficient documentation

## 2023-06-29 DIAGNOSIS — Z0000 Encounter for general adult medical examination without abnormal findings: Secondary | ICD-10-CM | POA: Insufficient documentation

## 2023-06-29 LAB — TSH: TSH: 1.45 m[IU]/L (ref 0.40–4.50)

## 2023-06-29 NOTE — Assessment & Plan Note (Signed)
 Euthyroid, normal TSH, Continue synthroid daily.

## 2023-06-29 NOTE — Assessment & Plan Note (Addendum)
 Followed by Pulm. Every 6 month surveillance CT showed eradication. Chest vest and nebs, Trelegy daily, Albuterol PRN.

## 2023-06-29 NOTE — Assessment & Plan Note (Signed)
 Declines therapy. Stressors include passing of her daugher and medical conditions. She manages well at home. Continue Restoril 15mg  nightly for sleep.

## 2023-06-29 NOTE — Assessment & Plan Note (Signed)

## 2023-06-29 NOTE — Assessment & Plan Note (Addendum)
 Followed by Cardiology. Comorbid conditions well controlled including glucose, cholesterol, and blood pressure. No chest pain. CMP, lipids, TSH normal.   History per Cardiology: Heart catheterization August 2019: Mild CAD, reported thoracic aortic aneurysm. Coronary CTA November 2021: Total CAC 261, 90th percentile, mild nonobstructive CAD. Patient has completely stopped smoking.

## 2023-06-29 NOTE — Assessment & Plan Note (Signed)
 Continue Cresor 20mg  daily.  I recommend consuming a heart healthy diet such as Mediterranean diet or DASH diet with whole grains, fruits, vegetable, fish, lean meats, nuts, and olive oil. Limit sweets and processed foods. I also encourage moderate intensity exercise 150 minutes weekly. This is 3-5 times weekly for 30-50 minutes each session. Goal should be pace of 3 miles/hours, or walking 1.5 miles in 30 minutes. The ASCVD Risk score (Arnett DK, et al., 2019) failed to calculate for the following reasons:   Risk score cannot be calculated because patient has a medical history suggesting prior/existing ASCVD

## 2023-07-04 NOTE — Addendum Note (Signed)
 Addended by: Park Meo on: 07/04/2023 01:40 PM   Modules accepted: Level of Service

## 2023-07-06 LAB — COMPREHENSIVE METABOLIC PANEL WITH GFR

## 2023-07-06 LAB — LIPID PANEL

## 2023-07-10 ENCOUNTER — Other Ambulatory Visit: Payer: Self-pay | Admitting: Family Medicine

## 2023-07-27 ENCOUNTER — Ambulatory Visit
Admission: RE | Admit: 2023-07-27 | Discharge: 2023-07-27 | Disposition: A | Discharge status: Home / Self Care | Source: Ambulatory Visit | Attending: Family Medicine | Admitting: Family Medicine

## 2023-07-27 ENCOUNTER — Ambulatory Visit: Admitting: Family Medicine

## 2023-07-27 VITALS — BP 130/76 | HR 59 | Temp 98.0°F | Ht 64.0 in | Wt 138.6 lb

## 2023-07-27 DIAGNOSIS — M5412 Radiculopathy, cervical region: Secondary | ICD-10-CM

## 2023-07-27 MED ORDER — PREDNISONE 20 MG PO TABS: ORAL_TABLET | ORAL | 0 refills | Status: DC

## 2023-07-27 NOTE — Progress Notes (Signed)
 Subjective:    Patient ID: Katrina Campbell, female    DOB: 1955-11-21, 68 y.o.   MRN: [id number]  Patient is a 68 year old Caucasian female complaining of a 1 month history of arm pain.  She complains of a burning stinging pain radiating from her shoulder down to her wrist on her right side.  She states at night, she is unable to find a position where her arm is comfortable.  She denies any weakness.  She denies any loss in grip strength.  She denies any numbness.  She denies any previous shingles like rash.  She denies any neck pain.  On physical exam she has normal reflexes checked at the biceps and brachial radialis.  She has normal grip strength.  She does have pain with abduction greater than 100 degrees.  She reports pain with decant testing and resisted abduction.  Past Medical History:  Diagnosis Date   Anxiety    COPD (chronic obstructive pulmonary disease) (HCC)    Coronary artery disease    Dental staining 07/15/2019   Depression    History of radiation therapy    Right lung- 11/03/20-12/15/20- Dr. 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   Past Surgical History:  Procedure Laterality Date   BALLOON DILATION N/A 04/29/2021   Procedure: BALLOON DILATION;  Surgeon: Janel Medford, MD;  Location: [facility];  Service: Endoscopy;  Laterality: N/A;   BIOPSY  01/14/2021   Procedure: BIOPSY;  Surgeon: Prudy Brownie, DO;  [facility];  Service: Pulmonary;;   BRONCHIAL BIOPSY  10/08/2020   Procedure: BRONCHIAL BIOPSIES;  Surgeon: Prudy Brownie, DO;  [facility];  Service: Pulmonary;;   BRONCHIAL BRUSHINGS  10/08/2020   Procedure: BRONCHIAL BRUSHINGS;  Surgeon: Prudy Brownie, DO;  [facility];  Service: Pulmonary;;   BRONCHIAL BRUSHINGS  01/14/2021   Procedure: BRONCHIAL BRUSHINGS;  Surgeon: Prudy Brownie, DO;  [facility];  Service: Pulmonary;;   BRONCHIAL WASHINGS  10/08/2020   Procedure: BRONCHIAL WASHINGS;  Surgeon: Prudy Brownie, DO;  [facility];  Service: Pulmonary;;   BRONCHIAL WASHINGS  01/14/2021   Procedure: BRONCHIAL WASHINGS;  Surgeon: Prudy Brownie, DO;  [facility];  Service: Pulmonary;;   ESOPHAGOGASTRODUODENOSCOPY (EGD) WITH PROPOFOL  N/A 04/29/2021   Procedure: ESOPHAGOGASTRODUODENOSCOPY (EGD) WITH PROPOFOL ;  Surgeon: Janel Medford, MD;  [facility];  Service: Endoscopy;  Laterality: N/A;   FINE NEEDLE ASPIRATION  10/08/2020   Procedure: FINE NEEDLE ASPIRATION (FNA) LINEAR;  Surgeon: Prudy Brownie, DO;  [facility];  Service: Pulmonary;;   LEFT HEART CATH AND CORONARY ANGIOGRAPHY N/A 11/07/2017   Procedure: LEFT HEART CATH AND CORONARY ANGIOGRAPHY;  Surgeon: Sammy Crisp, MD;  [facility];  Service: Cardiovascular;  Laterality: N/A;   NASAL SINUS SURGERY  1986   VIDEO BRONCHOSCOPY Bilateral 01/29/2019   Procedure: VIDEO BRONCHOSCOPY WITH FLUORO;  Surgeon: Mannam, Praveen, MD;  [facility];  Service: Cardiopulmonary;  Laterality: Bilateral;   VIDEO BRONCHOSCOPY Right 01/14/2021   Procedure: VIDEO BRONCHOSCOPY WITHOUT FLUORO;  Surgeon: Prudy Brownie, DO;  [facility];  Service: Pulmonary;  Laterality: Right;  possible cryotherapy   VIDEO BRONCHOSCOPY WITH ENDOBRONCHIAL ULTRASOUND N/A 10/08/2020   Procedure: VIDEO BRONCHOSCOPY WITH ENDOBRONCHIAL ULTRASOUND;  Surgeon: Prudy Brownie, DO;  [facility];  Service: Pulmonary;  Laterality: N/A;  Current Outpatient Medications on File Prior to Visit  Medication Sig Dispense Refill   acetaminophen  (TYLENOL ) 500 MG tablet Take 500 mg by mouth every 6 (six) hours as needed for moderate pain or headache.     albuterol  (PROVENTIL ) (2.5 MG/3ML) 0.083% nebulizer solution INHALE 3 MLS (2.5 MG TOTAL) VIA NEBULIZER EVERY 6 HOURS AS NEEDED FOR WHEEZING  OR SHORTNESS OF BREATH 540 mL 7   albuterol  (VENTOLIN  HFA) 108 (90 Base) MCG/ACT inhaler Inhale 2 puffs into the lungs every 6 (six) hours as needed for wheezing or shortness of breath. 18 g 1   aspirin  EC 81 MG tablet Take 81 mg by mouth daily with lunch.      Cholecalciferol (DIALYVITE VITAMIN D  5000) 125 MCG (5000 UT) capsule Take 5,000 Units by mouth daily.     esomeprazole  (NEXIUM ) 40 MG capsule TAKE 1 CAPSULE DAILY 90 capsule 3   ferrous sulfate 325 (65 FE) MG tablet Take 325 mg by mouth every other day.     Guaifenesin  (MUCINEX  MAXIMUM STRENGTH) 1200 MG TB12 Take 1,200 mg by mouth 2 (two) times daily.     ipratropium-albuterol  (DUONEB) 0.5-2.5 (3) MG/3ML SOLN Take 3 mLs by nebulization every 6 (six) hours as needed. 75 mL 1   levothyroxine  (SYNTHROID ) 88 MCG tablet TAKE 1 TABLET(88 MCG) BY MOUTH DAILY 30 tablet 2   Multiple Vitamins-Minerals (IMMUNE SUPPORT PO) Take 2 tablets by mouth daily.     Respiratory Therapy Supplies (FLUTTER) DEVI Use as directed 1 each 0   rosuvastatin  (CRESTOR ) 20 MG tablet Take 1 tablet (20 mg total) by mouth daily. Must keep scheduled appointment for future refills. Thank you. 90 tablet 3   temazepam  (RESTORIL ) 15 MG capsule TAKE 1 CAPSULE AT BEDTIME AS NEEDED 90 capsule 0   TRELEGY ELLIPTA  100-62.5-25 MCG/ACT AEPB USE 1 INHALATION DAILY 180 each 3   vitamin B-12 (CYANOCOBALAMIN) 1000 MCG tablet Take 1,000 mcg by mouth daily.     Current Facility-Administered Medications on File Prior to Visit  Medication Dose Route Frequency Provider Last Rate Last Admin   Sonafine emulsion 1 application  1 application  Topical Once Retta Caster, MD         Past Medical History:  Diagnosis Date   Anxiety    COPD (chronic obstructive pulmonary disease) (HCC)    Coronary artery disease    Dental staining 07/15/2019   Depression    History of radiation therapy    Right lung- 11/03/20-12/15/20- Dr. 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   Past Surgical History:  Procedure Laterality Date   BALLOON DILATION N/A 04/29/2021   Procedure: BALLOON DILATION;  Surgeon: Janel Medford, MD;  [facility];  Service: Endoscopy;  Laterality: N/A;   BIOPSY  01/14/2021   Procedure: BIOPSY;  Surgeon: Prudy Brownie, DO;  [facility];  Service: Pulmonary;;   BRONCHIAL BIOPSY  10/08/2020   Procedure: BRONCHIAL BIOPSIES;  Surgeon: Prudy Brownie, DO;  [facility];  Service: Pulmonary;;   BRONCHIAL BRUSHINGS  10/08/2020   Procedure: BRONCHIAL BRUSHINGS;  Surgeon: Prudy Brownie, DO;  [facility];  Service: Pulmonary;;   BRONCHIAL BRUSHINGS  01/14/2021   Procedure: BRONCHIAL BRUSHINGS;  Surgeon: Prudy Brownie, DO;  [facility];  Service: Pulmonary;;   BRONCHIAL WASHINGS  10/08/2020   Procedure: BRONCHIAL WASHINGS;  Surgeon: Prudy Brownie, DO;  [facility];  Service: Pulmonary;;   BRONCHIAL WASHINGS  01/14/2021   Procedure: BRONCHIAL WASHINGS;  Surgeon: Prudy Brownie, DO;  [facility];  Service: Pulmonary;;   ESOPHAGOGASTRODUODENOSCOPY (EGD) WITH PROPOFOL  N/A 04/29/2021   Procedure: ESOPHAGOGASTRODUODENOSCOPY (EGD) WITH PROPOFOL ;  Surgeon: Janel Medford, MD;  [facility];  Service: Endoscopy;  Laterality: N/A;   FINE NEEDLE ASPIRATION  10/08/2020   Procedure: FINE NEEDLE ASPIRATION (FNA) LINEAR;  Surgeon: Prudy Brownie, DO;  [facility];  Service: Pulmonary;;   LEFT HEART CATH AND CORONARY ANGIOGRAPHY N/A 11/07/2017   Procedure: LEFT HEART CATH AND CORONARY ANGIOGRAPHY;  Surgeon: Sammy Crisp, MD;  [facility];  Service: Cardiovascular;  Laterality: N/A;   NASAL SINUS SURGERY  1986   VIDEO BRONCHOSCOPY Bilateral 01/29/2019   Procedure: VIDEO BRONCHOSCOPY WITH FLUORO;  Surgeon: Mannam, Praveen, MD;  [facility];   Service: Cardiopulmonary;  Laterality: Bilateral;   VIDEO BRONCHOSCOPY Right 01/14/2021   Procedure: VIDEO BRONCHOSCOPY WITHOUT FLUORO;  Surgeon: Prudy Brownie, DO;  [facility];  Service: Pulmonary;  Laterality: Right;  possible cryotherapy   VIDEO BRONCHOSCOPY WITH ENDOBRONCHIAL ULTRASOUND N/A 10/08/2020   Procedure: VIDEO BRONCHOSCOPY WITH ENDOBRONCHIAL ULTRASOUND;  Surgeon: Prudy Brownie, DO;  [facility];  Service: Pulmonary;  Laterality: N/A;   Current Outpatient Medications on File Prior to Visit  Medication Sig Dispense Refill   acetaminophen  (TYLENOL ) 500 MG tablet Take 500 mg by mouth every 6 (six) hours as needed for moderate pain or headache.     albuterol  (PROVENTIL ) (2.5 MG/3ML) 0.083% nebulizer solution INHALE 3 MLS (2.5 MG TOTAL) VIA NEBULIZER EVERY 6 HOURS AS NEEDED FOR WHEEZING OR SHORTNESS OF BREATH 540 mL 7   albuterol  (VENTOLIN  HFA) 108 (90 Base) MCG/ACT inhaler Inhale 2 puffs into the lungs every 6 (six) hours as needed for wheezing or shortness of breath. 18 g 1   aspirin  EC 81 MG tablet Take 81 mg by mouth daily with lunch.      Cholecalciferol (DIALYVITE VITAMIN D  5000) 125 MCG (5000 UT) capsule Take 5,000 Units by mouth daily.     esomeprazole  (NEXIUM ) 40 MG capsule TAKE 1 CAPSULE DAILY 90 capsule 3   ferrous sulfate 325 (65 FE) MG tablet Take 325 mg by mouth every other day.     Guaifenesin  (MUCINEX  MAXIMUM STRENGTH) 1200 MG TB12 Take 1,200 mg by mouth 2 (two) times daily.     ipratropium-albuterol  (DUONEB) 0.5-2.5 (3) MG/3ML SOLN Take 3 mLs by nebulization every 6 (six) hours as needed. 75 mL 1   levothyroxine  (SYNTHROID ) 88 MCG tablet TAKE 1 TABLET(88 MCG) BY MOUTH DAILY 30 tablet 2   Multiple Vitamins-Minerals (IMMUNE SUPPORT PO) Take 2 tablets by mouth daily.     Respiratory Therapy Supplies (FLUTTER) DEVI Use as directed 1 each 0   rosuvastatin  (CRESTOR ) 20 MG tablet Take 1 tablet (20 mg total) by mouth daily. Must keep scheduled  appointment for future refills. Thank you. 90 tablet 3   temazepam  (RESTORIL ) 15 MG capsule TAKE 1 CAPSULE AT BEDTIME AS NEEDED 90 capsule 0   TRELEGY ELLIPTA  100-62.5-25 MCG/ACT AEPB USE 1 INHALATION DAILY 180 each 3   vitamin B-12 (CYANOCOBALAMIN) 1000 MCG tablet Take 1,000 mcg by mouth daily.     Current Facility-Administered Medications on File Prior to Visit  Medication Dose Route Frequency Provider Last Rate Last Admin   Sonafine emulsion 1 application  1 application  Topical Once Kinard, James, MD  Allergies  Allergen Reactions   Breztri  Aerosphere [Budeson-Glycopyrrol-Formoterol ] Shortness Of Breath   Codeine Other (See Comments)    Causes pt to blackout   Prednisone  Other (See Comments)    Passed out   Statins     Myalgias at higher doses   Social History   Socioeconomic History   Marital status: Married    Spouse name: Not on file   Number of children: Not on file   Years of education: Not on file   Highest education level: 12th grade  Occupational History   Not on file  Tobacco Use   Smoking status: Former    Current packs/day: 0.00    Average packs/day: 1.8 packs/day for 44.0 years (77.0 ttl pk-yrs)    Types: Cigarettes    Start date: 11/09/1968    Quit date: 11/09/2012    Years since quitting: 10.7   Smokeless tobacco: Never   Tobacco comments:    vapor  Vaping Use   Vaping status: Former  Substance and Sexual Activity   Alcohol use: Not Currently    Alcohol/week: 24.0 standard drinks of alcohol    Types: 24 Cans of beer per week   Drug use: Not Currently   Sexual activity: Not Currently  Other Topics Concern   Not on file  Social History Narrative   Not on file   Social Drivers of Health   Financial Resource Strain: Medium Risk (06/28/2023)   Overall Financial Resource Strain (CARDIA)    Difficulty of Paying Living Expenses: Somewhat hard  Food Insecurity: No Food Insecurity (06/28/2023)   Hunger Vital Sign    Worried About Running Out of Food  in the Last Year: Never true    Ran Out of Food in the Last Year: Never true  Transportation Needs: No Transportation Needs (06/28/2023)   PRAPARE - Administrator, Civil Service (Medical): No    Lack of Transportation (Non-Medical): No  Physical Activity: Sufficiently Active (06/28/2023)   Exercise Vital Sign    Days of Exercise per Week: 7 days    Minutes of Exercise per Session: 30 min  Stress: Stress Concern Present (06/28/2023)   [employer] of Occupational Health - Occupational Stress Questionnaire    Feeling of Stress : Rather much  Social Connections: Moderately Integrated (06/28/2023)   Social Connection and Isolation Panel [NHANES]    Frequency of Communication with Friends and Family: Three times a week    Frequency of Social Gatherings with Friends and Family: Twice a week    Attends Religious Services: More than 4 times per year    Active Member of [employer] or Organizations: No    Attends Banker Meetings: Never    Marital Status: Married  Catering manager Violence: Not At Risk (11/22/2022)   Humiliation, Afraid, Rape, and Kick questionnaire    Fear of Current or Ex-Partner: No    Emotionally Abused: No    Physically Abused: No    Sexually Abused: No     Review of Systems  Cardiovascular:  Positive for chest pain.  All other systems reviewed and are negative.      Objective:   Physical Exam Vitals reviewed.  Constitutional:      General: She is not in acute distress.    Appearance: She is well-developed. She is not diaphoretic.  HENT:     Head: Normocephalic and atraumatic.  Cardiovascular:     Rate and Rhythm: Normal rate and regular rhythm.     Heart sounds: Normal  heart sounds. No murmur heard.    No friction rub. No gallop.  Pulmonary:     Effort: Pulmonary effort is normal. No respiratory distress.     Breath sounds: Normal breath sounds. No stridor. No wheezing or rales.  Musculoskeletal:     Right shoulder: Tenderness  present. Decreased range of motion.     Right upper arm: Normal.     Right elbow: Normal.     Right wrist: Normal range of motion.           Assessment & Plan:  Cervical radiculopathy - Plan: DG Cervical Spine Complete I am uncertain.  Some of her symptoms sound like cervical radiculopathy.  Therefore I would like to get an x-ray of her neck and try the patient on a prednisone  taper pack.  Some of her symptoms sound like bursitis in the right shoulder.  If the x-ray is unremarkable and prednisone  does not help I would recommend trying cortisone injection in the right subacromial space.  However I cannot explain the pain radiating from her shoulder down to her wrist a subtle versus

## 2023-08-04 ENCOUNTER — Encounter: Payer: Self-pay | Admitting: Family Medicine

## 2023-08-08 ENCOUNTER — Other Ambulatory Visit: Payer: Self-pay | Admitting: Family Medicine

## 2023-08-08 DIAGNOSIS — M5412 Radiculopathy, cervical region: Secondary | ICD-10-CM

## 2023-08-08 DIAGNOSIS — M5030 Other cervical disc degeneration, unspecified cervical region: Secondary | ICD-10-CM

## 2023-08-08 NOTE — Progress Notes (Signed)
Mri c

## 2023-08-10 ENCOUNTER — Emergency Department (HOSPITAL_COMMUNITY)

## 2023-08-10 ENCOUNTER — Encounter (HOSPITAL_COMMUNITY): Payer: Self-pay

## 2023-08-10 ENCOUNTER — Observation Stay (HOSPITAL_COMMUNITY)
Admission: EM | Admit: 2023-08-10 | Discharge: 2023-08-12 | Disposition: A | Discharge status: Home / Self Care | Attending: Internal Medicine | Admitting: Internal Medicine

## 2023-08-10 ENCOUNTER — Other Ambulatory Visit: Payer: Self-pay

## 2023-08-10 DIAGNOSIS — I4891 Unspecified atrial fibrillation: Secondary | ICD-10-CM | POA: Diagnosis not present

## 2023-08-10 DIAGNOSIS — J449 Chronic obstructive pulmonary disease, unspecified: Secondary | ICD-10-CM | POA: Diagnosis present

## 2023-08-10 DIAGNOSIS — C3411 Malignant neoplasm of upper lobe, right bronchus or lung: Secondary | ICD-10-CM | POA: Insufficient documentation

## 2023-08-10 DIAGNOSIS — Z79899 Other long term (current) drug therapy: Secondary | ICD-10-CM | POA: Diagnosis not present

## 2023-08-10 DIAGNOSIS — R072 Precordial pain: Secondary | ICD-10-CM | POA: Diagnosis present

## 2023-08-10 DIAGNOSIS — E785 Hyperlipidemia, unspecified: Secondary | ICD-10-CM | POA: Diagnosis not present

## 2023-08-10 DIAGNOSIS — R079 Chest pain, unspecified: Secondary | ICD-10-CM | POA: Diagnosis present

## 2023-08-10 DIAGNOSIS — I10 Essential (primary) hypertension: Secondary | ICD-10-CM | POA: Diagnosis not present

## 2023-08-10 DIAGNOSIS — Z7982 Long term (current) use of aspirin: Secondary | ICD-10-CM | POA: Diagnosis not present

## 2023-08-10 DIAGNOSIS — E039 Hypothyroidism, unspecified: Secondary | ICD-10-CM | POA: Diagnosis not present

## 2023-08-10 DIAGNOSIS — Z87891 Personal history of nicotine dependence: Secondary | ICD-10-CM | POA: Diagnosis not present

## 2023-08-10 DIAGNOSIS — D696 Thrombocytopenia, unspecified: Secondary | ICD-10-CM | POA: Diagnosis not present

## 2023-08-10 DIAGNOSIS — K219 Gastro-esophageal reflux disease without esophagitis: Secondary | ICD-10-CM | POA: Diagnosis present

## 2023-08-10 DIAGNOSIS — E876 Hypokalemia: Secondary | ICD-10-CM | POA: Insufficient documentation

## 2023-08-10 DIAGNOSIS — C3491 Malignant neoplasm of unspecified part of right bronchus or lung: Secondary | ICD-10-CM | POA: Diagnosis present

## 2023-08-10 DIAGNOSIS — R918 Other nonspecific abnormal finding of lung field: Principal | ICD-10-CM

## 2023-08-10 DIAGNOSIS — I2510 Atherosclerotic heart disease of native coronary artery without angina pectoris: Secondary | ICD-10-CM | POA: Diagnosis not present

## 2023-08-10 LAB — CBC
HCT: 41.4 % (ref 36.0–46.0)
Hemoglobin: 14 g/dL (ref 12.0–15.0)
MCH: 29.9 pg (ref 26.0–34.0)
MCHC: 33.8 g/dL (ref 30.0–36.0)
MCV: 88.5 fL (ref 80.0–100.0)
Platelets: 120 10*3/uL — ABNORMAL LOW (ref 150–400)
RBC: 4.68 MIL/uL (ref 3.87–5.11)
RDW: 13.9 % (ref 11.5–15.5)
WBC: 5.8 10*3/uL (ref 4.0–10.5)
nRBC: 0 % (ref 0.0–0.2)

## 2023-08-10 LAB — BASIC METABOLIC PANEL WITH GFR
Anion gap: 8 (ref 5–15)
BUN: 10 mg/dL (ref 8–23)
CO2: 22 mmol/L (ref 22–32)
Calcium: 9 mg/dL (ref 8.9–10.3)
Chloride: 115 mmol/L — ABNORMAL HIGH (ref 98–111)
Creatinine, Ser: 0.73 mg/dL (ref 0.44–1.00)
GFR, Estimated: >60 mL/min (ref >60)
Glucose, Bld: 116 mg/dL — ABNORMAL HIGH (ref 70–99)
Potassium: 3.2 mmol/L — ABNORMAL LOW (ref 3.5–5.1)
Sodium: 145 mmol/L (ref 135–145)

## 2023-08-10 LAB — TROPONIN I (HIGH SENSITIVITY): Troponin I (High Sensitivity): 8 ng/L (ref <18)

## 2023-08-10 MED ORDER — SODIUM CHLORIDE 0.9 % IV BOLUS
500.0000 mL | Freq: Once | INTRAVENOUS | Status: AC
  Administered 2023-08-10: 500 mL via INTRAVENOUS

## 2023-08-10 NOTE — ED Triage Notes (Signed)
 Patient BIB [facility] with complaint of chest pain.   Patient reports that the chest pain woke her up from her sleep, reports in center of chest, non radiating. Patient reports taking 324 mg ASA prior to EMS arrival.   EMS reports that patient received 1 neb treatment from [facility] prior to their arrival. [facility] reports patient heart rhythm was Afib with RVR. EMS administered 15 mg Cardizem and started patient on gtt. EMS stopped gtt prior to patients arrival to ER. EMS reports after administration of Cardizem bolus patient converted to Afib with NO RVR.

## 2023-08-10 NOTE — ED Notes (Signed)
 MD Palumbo in room to assess patient at this time.

## 2023-08-10 NOTE — ED Notes (Signed)
 Patient returned to unit from radiology. Patient placed in room 33

## 2023-08-11 ENCOUNTER — Other Ambulatory Visit: Payer: Self-pay | Admitting: Medical

## 2023-08-11 ENCOUNTER — Telehealth (HOSPITAL_COMMUNITY): Payer: Self-pay | Admitting: Pharmacy Technician

## 2023-08-11 ENCOUNTER — Other Ambulatory Visit (HOSPITAL_COMMUNITY): Payer: Self-pay

## 2023-08-11 ENCOUNTER — Observation Stay (HOSPITAL_COMMUNITY)

## 2023-08-11 ENCOUNTER — Observation Stay (HOSPITAL_BASED_OUTPATIENT_CLINIC_OR_DEPARTMENT_OTHER)

## 2023-08-11 ENCOUNTER — Encounter (HOSPITAL_COMMUNITY): Payer: Self-pay | Admitting: Internal Medicine

## 2023-08-11 ENCOUNTER — Emergency Department (HOSPITAL_COMMUNITY)

## 2023-08-11 DIAGNOSIS — E876 Hypokalemia: Secondary | ICD-10-CM | POA: Insufficient documentation

## 2023-08-11 DIAGNOSIS — R079 Chest pain, unspecified: Secondary | ICD-10-CM | POA: Diagnosis present

## 2023-08-11 DIAGNOSIS — I4891 Unspecified atrial fibrillation: Secondary | ICD-10-CM | POA: Diagnosis not present

## 2023-08-11 DIAGNOSIS — I350 Nonrheumatic aortic (valve) stenosis: Secondary | ICD-10-CM

## 2023-08-11 DIAGNOSIS — E785 Hyperlipidemia, unspecified: Secondary | ICD-10-CM

## 2023-08-11 DIAGNOSIS — I2510 Atherosclerotic heart disease of native coronary artery without angina pectoris: Secondary | ICD-10-CM | POA: Diagnosis not present

## 2023-08-11 DIAGNOSIS — D696 Thrombocytopenia, unspecified: Secondary | ICD-10-CM | POA: Insufficient documentation

## 2023-08-11 LAB — HEPATIC FUNCTION PANEL
ALT: 28 U/L (ref 0–44)
AST: 22 U/L (ref 15–41)
Albumin: 3.1 g/dL — ABNORMAL LOW (ref 3.5–5.0)
Alkaline Phosphatase: 65 U/L (ref 38–126)
Bilirubin, Direct: <0.1 mg/dL (ref 0.0–0.2)
Total Bilirubin: 0.3 mg/dL (ref 0.0–1.2)
Total Protein: 5.5 g/dL — ABNORMAL LOW (ref 6.5–8.1)

## 2023-08-11 LAB — BASIC METABOLIC PANEL WITH GFR
Anion gap: 8 (ref 5–15)
BUN: 7 mg/dL — ABNORMAL LOW (ref 8–23)
CO2: 23 mmol/L (ref 22–32)
Calcium: 8.6 mg/dL — ABNORMAL LOW (ref 8.9–10.3)
Chloride: 113 mmol/L — ABNORMAL HIGH (ref 98–111)
Creatinine, Ser: 0.73 mg/dL (ref 0.44–1.00)
GFR, Estimated: >60 mL/min (ref >60)
Glucose, Bld: 101 mg/dL — ABNORMAL HIGH (ref 70–99)
Potassium: 4 mmol/L (ref 3.5–5.1)
Sodium: 144 mmol/L (ref 135–145)

## 2023-08-11 LAB — TSH: TSH: 7.219 u[IU]/mL — ABNORMAL HIGH (ref 0.350–4.500)

## 2023-08-11 LAB — ECHOCARDIOGRAM COMPLETE
Height: 64 in
S' Lateral: 3 cm
Weight: 2204.6 [oz_av]

## 2023-08-11 LAB — TROPONIN I (HIGH SENSITIVITY): Troponin I (High Sensitivity): 10 ng/L (ref <18)

## 2023-08-11 LAB — HEPARIN LEVEL (UNFRACTIONATED): Heparin Unfractionated: 0.5 [IU]/mL (ref 0.30–0.70)

## 2023-08-11 LAB — MAGNESIUM: Magnesium: 1.9 mg/dL (ref 1.7–2.4)

## 2023-08-11 LAB — HIV ANTIBODY (ROUTINE TESTING W REFLEX): HIV Screen 4th Generation wRfx: NONREACTIVE

## 2023-08-11 MED ORDER — POTASSIUM CHLORIDE CRYS ER 20 MEQ PO TBCR
40.0000 meq | EXTENDED_RELEASE_TABLET | Freq: Once | ORAL | Status: AC
  Administered 2023-08-11: 40 meq via ORAL
  Filled 2023-08-11: qty 2

## 2023-08-11 MED ORDER — HEPARIN (PORCINE) 25000 UT/250ML-% IV SOLN
1000.0000 [IU]/h | INTRAVENOUS | Status: DC
  Administered 2023-08-11: 1000 [IU]/h via INTRAVENOUS
  Filled 2023-08-11: qty 250

## 2023-08-11 MED ORDER — DILTIAZEM HCL 25 MG/5ML IV SOLN
15.0000 mg | Freq: Once | INTRAVENOUS | Status: AC
  Administered 2023-08-11: 15 mg via INTRAVENOUS
  Filled 2023-08-11: qty 5

## 2023-08-11 MED ORDER — IOHEXOL 350 MG/ML SOLN
75.0000 mL | Freq: Once | INTRAVENOUS | Status: AC | PRN
  Administered 2023-08-11: 75 mL via INTRAVENOUS

## 2023-08-11 MED ORDER — APIXABAN 5 MG PO TABS
5.0000 mg | ORAL_TABLET | Freq: Two times a day (BID) | ORAL | Status: DC
  Administered 2023-08-11 – 2023-08-12 (×3): 5 mg via ORAL
  Filled 2023-08-11 (×3): qty 1

## 2023-08-11 MED ORDER — ROSUVASTATIN CALCIUM 20 MG PO TABS
20.0000 mg | ORAL_TABLET | Freq: Every day | ORAL | Status: DC
  Administered 2023-08-11 – 2023-08-12 (×2): 20 mg via ORAL
  Filled 2023-08-11 (×2): qty 1

## 2023-08-11 MED ORDER — ACETAMINOPHEN 650 MG RE SUPP: 650.0000 mg | Freq: Four times a day (QID) | RECTAL | Status: DC | PRN

## 2023-08-11 MED ORDER — MAGNESIUM SULFATE 2 GM/50ML IV SOLN
2.0000 g | Freq: Once | INTRAVENOUS | Status: AC
Start: 2023-08-11 — End: 2023-08-11
  Administered 2023-08-11: 2 g via INTRAVENOUS
  Filled 2023-08-11: qty 50

## 2023-08-11 MED ORDER — HEPARIN BOLUS VIA INFUSION
4000.0000 [IU] | Freq: Once | INTRAVENOUS | Status: AC
  Administered 2023-08-11: 4000 [IU] via INTRAVENOUS
  Filled 2023-08-11: qty 4000

## 2023-08-11 MED ORDER — DILTIAZEM HCL 30 MG PO TABS
30.0000 mg | ORAL_TABLET | Freq: Once | ORAL | Status: AC
  Administered 2023-08-11: 30 mg via ORAL
  Filled 2023-08-11: qty 1

## 2023-08-11 MED ORDER — LEVOTHYROXINE SODIUM 88 MCG PO TABS
88.0000 ug | ORAL_TABLET | Freq: Every day | ORAL | Status: DC
Start: 2023-08-11 — End: 2023-08-12
  Administered 2023-08-11 – 2023-08-12 (×2): 88 ug via ORAL
  Filled 2023-08-11 (×2): qty 1

## 2023-08-11 MED ORDER — ASPIRIN 81 MG PO TBEC
81.0000 mg | DELAYED_RELEASE_TABLET | Freq: Every day | ORAL | Status: DC
  Administered 2023-08-11 – 2023-08-12 (×2): 81 mg via ORAL
  Filled 2023-08-11 (×2): qty 1

## 2023-08-11 MED ORDER — DILTIAZEM HCL-DEXTROSE 125-5 MG/125ML-% IV SOLN (PREMIX)
5.0000 mg/h | INTRAVENOUS | Status: DC
  Administered 2023-08-11: 5 mg/h via INTRAVENOUS
  Administered 2023-08-12: 10 mg/h via INTRAVENOUS
  Filled 2023-08-11 (×2): qty 125

## 2023-08-11 MED ORDER — TEMAZEPAM 15 MG PO CAPS
15.0000 mg | ORAL_CAPSULE | Freq: Every day | ORAL | Status: DC
  Administered 2023-08-11: 15 mg via ORAL
  Filled 2023-08-11: qty 1

## 2023-08-11 MED ORDER — VITAMIN B-12 1000 MCG PO TABS
1000.0000 ug | ORAL_TABLET | Freq: Every day | ORAL | Status: DC
  Administered 2023-08-11 – 2023-08-12 (×2): 1000 ug via ORAL
  Filled 2023-08-11 (×2): qty 1

## 2023-08-11 MED ORDER — SODIUM CHLORIDE 0.9 % IV BOLUS
500.0000 mL | Freq: Once | INTRAVENOUS | Status: AC
  Administered 2023-08-11: 500 mL via INTRAVENOUS

## 2023-08-11 MED ORDER — FERROUS SULFATE 325 (65 FE) MG PO TABS
325.0000 mg | ORAL_TABLET | ORAL | Status: DC
  Administered 2023-08-12: 325 mg via ORAL
  Filled 2023-08-11: qty 1

## 2023-08-11 MED ORDER — ALBUTEROL SULFATE (2.5 MG/3ML) 0.083% IN NEBU: 3.0000 mL | INHALATION_SOLUTION | Freq: Four times a day (QID) | RESPIRATORY_TRACT | Status: DC | PRN

## 2023-08-11 MED ORDER — PANTOPRAZOLE SODIUM 40 MG PO TBEC
40.0000 mg | DELAYED_RELEASE_TABLET | Freq: Every day | ORAL | Status: DC
  Administered 2023-08-11 – 2023-08-12 (×2): 40 mg via ORAL
  Filled 2023-08-11 (×2): qty 1

## 2023-08-11 MED ORDER — ACETAMINOPHEN 325 MG PO TABS: 650.0000 mg | ORAL_TABLET | Freq: Four times a day (QID) | ORAL | Status: DC | PRN

## 2023-08-11 MED ORDER — POTASSIUM CHLORIDE 20 MEQ PO PACK: 20.0000 meq | PACK | Freq: Once | ORAL | Status: DC

## 2023-08-11 MED ORDER — BUDESON-GLYCOPYRROL-FORMOTEROL 160-9-4.8 MCG/ACT IN AERO
2.0000 | INHALATION_SPRAY | Freq: Two times a day (BID) | RESPIRATORY_TRACT | Status: DC
  Filled 2023-08-11: qty 5.9

## 2023-08-11 NOTE — ED Notes (Signed)
Snack bag and drink provided 

## 2023-08-11 NOTE — Telephone Encounter (Signed)
 Patient Product/process development scientist completed.    The patient is insured through [employer].     Ran test claim for Eliquis 5 mg and the current 30 day co-pay is $43.00.  Ran test claim for Xarelto 20 mg and the current 30 day co-pay is $43.00.  This test claim was processed through [facility]- copay amounts may vary at other pharmacies due to [employer], or as the patient moves through the different stages of their insurance plan.     Roland Earl, CPHT Pharmacy Technician III Certified Patient Advocate [facility] Patient Advocate Team Direct Number: [phone]  Fax: [phone]

## 2023-08-11 NOTE — Progress Notes (Addendum)
 PHARMACY - ANTICOAGULATION CONSULT NOTE  Pharmacy Consult for heparin  Indication: atrial fibrillation  Allergies  Allergen Reactions   Breztri  Aerosphere [Budeson-Glycopyrrol-Formoterol ] Shortness Of Breath   Codeine Other (See Comments)    Causes pt to blackout   Statins     Myalgias at higher doses    Patient Measurements: Height: 5\' 4"  (162.6 cm) Weight: 62.5 kg (137 lb 12.6 oz) IBW/kg (Calculated) : 54.7 HEPARIN  DW (KG): 62.5  Vital Signs: Temp: 98 F (36.7 C) (05/09 0903) Temp Source: Oral (05/09 0903) BP: 102/85 (05/09 0903) Pulse Rate: 138 (05/09 0903)  Labs: Recent Labs    08/10/23 2253 08/11/23 0027 08/11/23 0542 08/11/23 1013  HGB 14.0  --   --   --   HCT 41.4  --   --   --   PLT 120*  --   --   --   HEPARINUNFRC  --   --   --  0.50  CREATININE 0.73  --  0.73  --   TROPONINIHS 8 10  --   --     Estimated Creatinine Clearance: 57.3 mL/min (by C-G formula based on SCr of 0.73 mg/dL).   Medical History: Past Medical History:  Diagnosis Date   Anxiety    COPD (chronic obstructive pulmonary disease) (HCC)    Coronary artery disease    Dental staining 07/15/2019   Depression    History of radiation therapy    Right lung- 11/03/20-12/15/20- Dr. 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   Initial heparin  level 0.5 is therapeutic on 1000 units/hr.  CBC stable.  No issues noted.  CT showed coronary calcifications, cardiology to weigh in on possible intervention.  Will f/u ability to transition to DOAC.  Eliquis / Xarelto = $43 / month  Goal of Therapy:  Heparin  level 0.3-0.7 units/ml Monitor platelets by anticoagulation protocol: Yes   Plan:  Heparin  IV 1000 units/hr Monitor daily heparin  levels and CBC F/u transition  to DOAC, intervention plans  Addendum: Cards consulted to switch to Eliquis.  STOP heparin , start Eliquis 5 mg BID.  Will educate prior to discharge.  Cecillia Cogan, PharmD Clinical Pharmacist 08/11/2023  11:41 AM

## 2023-08-11 NOTE — Plan of Care (Signed)
 PLAN OF CARE NOTE  68 year old with PMH of nonobstructive CAD, COPD, NSCLC, hypothyroidism who presented with chest discomfort and was found to have A-fib with RVR. Patient has been seen by cardiology.  Planning for TEE/DCCV on 08/14/2023.  Today, the patient complains of fatigue.  She denies chest pain. Chest: Clear to auscultation. CVS: S1-S2 irregular.  TTE reveals EF 55 to 60%, no regional wall motion abnormalities, normal size of left atrium.  - Transition from heparin  GTT to Eliquis.  (Confirmed DOAC co-pay $43). - Continue diltiazem infusion. - Switched from albuterol  to Xopenex to minimize tachycardia.  MDALA-GAUSI, Katrina Campbell  08/11/23  2:13 PM

## 2023-08-11 NOTE — ED Notes (Signed)
 Assumed care pt from Nilda Barthel, RN

## 2023-08-11 NOTE — Consult Note (Addendum)
 Cardiology Consultation   Patient ID: Katrina Campbell MRN: [id number]; DOB: 1955/07/14  Admit date: 08/10/2023 Date of Consult: 08/11/2023  PCP:  Jenelle Mis, FNP   [facility] Providers Cardiologist:  Olinda Bertrand, DO   Patient Profile:   Katrina Campbell is a 68 y.o. female with a hx of nonobstructive CAD per cath 2019, COPD, stage III NSCLC followed by oncology who is being seen 08/11/2023 for the evaluation of chest pain and Afib RVR at the request of Dr. Melonie Square.  History of Present Illness:   Katrina Campbell  CT coronary 02/2020 with coronary calcium  score 261 with mild nonobstructive CAD in the distal left main, proximal LAD and LCX. Medical therapy recommended. Most recent echo 2023 demonstrated preserved LVEF 55-60%, grade 1 DD, normal RV function, trivial MR, and mild AS - recommended for echocardiograms every 3 years.   She was recently treated with prednisone  for possible cervical radiculopathy with RUE pain by PCP. She traveled [place] from the beach yesterday. Last evening she developed chest pain in bed prompting ER evaluation at [facility].   On presentation, she was found in Afib RVR. Rates improved with fluid bolus. She as transferred to [facility] for further management of her chest pain and newly diagnosed Afib.   K 3.2 --> 4.0 sCr 0.73 Mg 1.9 HS troponin x 2 negative EKG with Afib VR 93  She was treated with 324 mg ASA followed by 81 mg ASA daily 30 mg IV cardizem push with improvement in rates to the 90s She was also started on heparin  gtt.   During my interview, she reports sleeping for about an hour Thurs night and then woke up feeling like a "ton of bricks" were sitting on her chest. She took 2 baby ASA and called EMS. She states that she previously "flatlined" with NTG and does not take this anymore. Today, she feels well but is very tired, has not slept in 2 days. She is generally unaware of her Afib and denies current chest pain. She  has been a little more short of breath  recently and has increased her PRN albuterol  use.    Past Medical History:  Diagnosis Date   Anxiety    COPD (chronic obstructive pulmonary disease) (HCC)    Coronary artery disease    Dental staining 07/15/2019   Depression    History of radiation therapy    Right lung- 11/03/20-12/15/20- Dr. 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    Past Surgical History:  Procedure Laterality Date   BALLOON DILATION N/A 04/29/2021   Procedure: BALLOON DILATION;  Surgeon: Janel Medford, MD;  Location: [facility];  Service: Endoscopy;  Laterality: N/A;   BIOPSY  01/14/2021   Procedure: BIOPSY;  Surgeon: Prudy Brownie, DO;  Location: [facility]OS[facility];  Service: Pulmonary;;   BRONCHIAL BIOPSY  10/08/2020   Procedure: BRONCHIAL BIOPSIES;  Surgeon: Prudy Brownie, DO;  Location: [facility];  Service: Pulmonary;;   BRONCHIAL BRUSHINGS  10/08/2020   Procedure: BRONCHIAL BRUSHINGS;  Surgeon: Prudy Brownie, DO;  Location: [facility];  Service: Pulmonary;;   BRONCHIAL BRUSHINGS  01/14/2021   Procedure: BRONCHIAL BRUSHINGS;  Surgeon: Prudy Brownie, DO;  Location: [facility];  Service: Pulmonary;;   BRONCHIAL WASHINGS  10/08/2020   Procedure: BRONCHIAL WASHINGS;  Surgeon: Prudy Brownie, DO;  Location: [facility];  Service: Pulmonary;;  BRONCHIAL WASHINGS  01/14/2021   Procedure: BRONCHIAL WASHINGS;  Surgeon: Prudy Brownie, DO;  Location: [facility];  Service: Pulmonary;;   ESOPHAGOGASTRODUODENOSCOPY (EGD) WITH PROPOFOL  N/A 04/29/2021   Procedure: ESOPHAGOGASTRODUODENOSCOPY (EGD) WITH PROPOFOL ;  Surgeon: Janel Medford, MD;  Location: [facility];  Service: Endoscopy;  Laterality: N/A;   FINE NEEDLE ASPIRATION  10/08/2020   Procedure: FINE NEEDLE ASPIRATION (FNA) LINEAR;  Surgeon: Prudy Brownie, DO;  Location: [facility];  Service: Pulmonary;;   LEFT HEART CATH AND  CORONARY ANGIOGRAPHY N/A 11/07/2017   Procedure: LEFT HEART CATH AND CORONARY ANGIOGRAPHY;  Surgeon: Sammy Crisp, MD;  Location: [facility];  Service: Cardiovascular;  Laterality: N/A;   NASAL SINUS SURGERY  1986   VIDEO BRONCHOSCOPY Bilateral 01/29/2019   Procedure: VIDEO BRONCHOSCOPY WITH FLUORO;  Surgeon: Mannam, Praveen, MD;  Location: [facility];  Service: Cardiopulmonary;  Laterality: Bilateral;   VIDEO BRONCHOSCOPY Right 01/14/2021   Procedure: VIDEO BRONCHOSCOPY WITHOUT FLUORO;  Surgeon: Prudy Brownie, DO;  Location: [facility];  Service: Pulmonary;  Laterality: Right;  possible cryotherapy   VIDEO BRONCHOSCOPY WITH ENDOBRONCHIAL ULTRASOUND N/A 10/08/2020   Procedure: VIDEO BRONCHOSCOPY WITH ENDOBRONCHIAL ULTRASOUND;  Surgeon: Prudy Brownie, DO;  Location: [facility];  Service: Pulmonary;  Laterality: N/A;     Home Medications:  Prior to Admission medications   Medication Sig Start Date End Date Taking? Authorizing Provider  acetaminophen  (TYLENOL ) 500 MG tablet Take 500 mg by mouth every 6 (six) hours as needed for moderate pain or headache.   Yes [provider]  albuterol  (PROVENTIL ) (2.5 MG/3ML) 0.083% nebulizer solution INHALE 3 MLS (2.5 MG TOTAL) VIA NEBULIZER EVERY 6 HOURS AS NEEDED FOR WHEEZING OR SHORTNESS OF BREATH 03/30/23  Yes Icard, Lucie Ruts, DO  aspirin  EC 81 MG tablet Take 81 mg by mouth daily with lunch.    Yes [provider]  Cholecalciferol (DIALYVITE VITAMIN D  5000) 125 MCG (5000 UT) capsule Take 5,000 Units by mouth daily.   Yes [provider]  esomeprazole  (NEXIUM ) 40 MG capsule TAKE 1 CAPSULE DAILY 07/11/23  Yes Yolanda Hence S, FNP  ferrous sulfate 325 (65 FE) MG tablet Take 325 mg by mouth every other day.   Yes [provider]  Guaifenesin  (MUCINEX  MAXIMUM STRENGTH) 1200 MG TB12 Take 1,200 mg by mouth daily.   Yes [provider]  levothyroxine  (SYNTHROID ) 88 MCG tablet TAKE 1 TABLET(88 MCG) BY  MOUTH DAILY 09/19/22  Yes Tysinger, Christiane Cowing, PA-C  Multiple Vitamins-Minerals (IMMUNE SUPPORT PO) Take 1 capsule by mouth daily.   Yes [provider]  rosuvastatin  (CRESTOR ) 20 MG tablet Take 1 tablet (20 mg total) by mouth daily. Must keep scheduled appointment for future refills. Thank you. 05/20/23  Yes Tolia, Sunit, DO  temazepam  (RESTORIL ) 15 MG capsule TAKE 1 CAPSULE AT BEDTIME AS NEEDED Patient taking differently: Take 15 mg by mouth at bedtime. 05/17/23  Yes Tysinger, Christiane Cowing, PA-C  TRELEGY ELLIPTA  100-62.5-25 MCG/ACT AEPB USE 1 INHALATION DAILY 04/15/23  Yes Icard, Lucie Ruts, DO  vitamin B-12 (CYANOCOBALAMIN) 1000 MCG tablet Take 1,000 mcg by mouth daily.   Yes [provider]  albuterol  (VENTOLIN  HFA) 108 (90 Base) MCG/ACT inhaler Inhale 2 puffs into the lungs every 6 (six) hours as needed for wheezing or shortness of breath. Patient not taking: Reported on 08/11/2023 06/07/23   Denson Flake, MD  predniSONE  (DELTASONE ) 20 MG tablet 3 tabs poqday 1-2, 2 tabs poqday 3-4, 1 tab poqday 5-6 Patient not  taking: Reported on 08/11/2023 07/27/23   Austine Lefort, MD  Respiratory Therapy Supplies (FLUTTER) DEVI Use as directed 06/14/19   Prudy Brownie, DO    Inpatient Medications: Scheduled Meds:  aspirin  EC  81 mg Oral Q lunch   budeson-glycopyrrolate-formoterol   2 puff Inhalation BID   cyanocobalamin  1,000 mcg Oral Daily   diltiazem  15 mg Intravenous Once   [START ON 08/12/2023] ferrous sulfate  325 mg Oral QODAY   levothyroxine   88 mcg Oral Q0600   pantoprazole   40 mg Oral Daily   rosuvastatin   20 mg Oral Daily   temazepam   15 mg Oral QHS   Continuous Infusions:  heparin  1,000 Units/hr (08/11/23 0310)   PRN Meds: acetaminophen  **OR** acetaminophen , albuterol   Allergies:    Allergies  Allergen Reactions   Breztri  Aerosphere [Budeson-Glycopyrrol-Formoterol ] Shortness Of Breath   Codeine Other (See Comments)    Causes pt to blackout   Statins     Myalgias at  higher doses    Social History:   Social History   Socioeconomic History   Marital status: Married    Spouse name: Not on file   Number of children: Not on file   Years of education: Not on file   Highest education level: 12th grade  Occupational History   Not on file  Tobacco Use   Smoking status: Former    Current packs/day: 0.00    Average packs/day: 1.8 packs/day for 44.0 years (77.0 ttl pk-yrs)    Types: Cigarettes    Start date: 11/09/1968    Quit date: 11/09/2012    Years since quitting: 10.7   Smokeless tobacco: Never   Tobacco comments:    vapor  Vaping Use   Vaping status: Former  Substance and Sexual Activity   Alcohol use: Not Currently    Alcohol/week: 24.0 standard drinks of alcohol    Types: 24 Cans of beer per week   Drug use: Not Currently   Sexual activity: Not Currently  Other Topics Concern   Not on file  Social History Narrative   Not on file   Social Drivers of Health   Financial Resource Strain: Medium Risk (06/28/2023)   Overall Financial Resource Strain (CARDIA)    Difficulty of Paying Living Expenses: Somewhat hard  Food Insecurity: No Food Insecurity (08/11/2023)   Hunger Vital Sign    Worried About Running Out of Food in the Last Year: Never true    Ran Out of Food in the Last Year: Never true  Transportation Needs: No Transportation Needs (08/11/2023)   PRAPARE - Administrator, Civil Service (Medical): No    Lack of Transportation (Non-Medical): No  Physical Activity: Sufficiently Active (06/28/2023)   Exercise Vital Sign    Days of Exercise per Week: 7 days    Minutes of Exercise per Session: 30 min  Stress: Stress Concern Present (06/28/2023)   [employer] of Occupational Health - Occupational Stress Questionnaire    Feeling of Stress : Rather much  Social Connections: Socially Integrated (08/11/2023)   Social Connection and Isolation Panel [NHANES]    Frequency of Communication with Friends and Family: More than three  times a week    Frequency of Social Gatherings with Friends and Family: More than three times a week    Attends Religious Services: More than 4 times per year    Active Member of [employer] or Organizations: Yes    Attends Banker Meetings: More than 4 times per year  Marital Status: Married  Catering manager Violence: Not At Risk (08/11/2023)   Humiliation, Afraid, Rape, and Kick questionnaire    Fear of Current or Ex-Partner: No    Emotionally Abused: No    Physically Abused: No    Sexually Abused: No    Family History:    Family History  Problem Relation Age of Onset   Heart attack Mother    Emphysema Mother    Congestive Heart Failure Mother    Heart attack Father    Congestive Heart Failure Father    Asthma Other    Lung disease Sister    Lung disease Brother    Colon cancer Neg Hx      ROS:  Please see the history of present illness.   All other ROS reviewed and negative.     Physical Exam/Data:   Vitals:   08/11/23 0645 08/11/23 0700 08/11/23 0730 08/11/23 0903  BP: 115/72 104/70 103/73 102/85  Pulse: 86 (!) 29 (!) 141 (!) 138  Resp: (!) 21 20 20 20   Temp:   98.1 F (36.7 C) 98 F (36.7 C)  TempSrc:   Oral Oral  SpO2: 99% 96% 96% 98%  Weight:    62.5 kg  Height:    5\' 4"  (1.626 m)    Intake/Output Summary (Last 24 hours) at 08/11/2023 1123 Last data filed at 08/11/2023 1006 Gross per 24 hour  Intake 1740 ml  Output --  Net 1740 ml      08/11/2023    9:03 AM 08/10/2023   11:51 PM 07/27/2023    1:58 PM  Last 3 Weights  Weight (lbs) 137 lb 12.6 oz 211 lb 3.2 oz 138 lb 9.6 oz  Weight (kg) 62.5 kg 95.8 kg 62.869 kg     Body mass index is 23.65 kg/m.  General:  Well nourished, well developed, in no acute distress Vascular: No carotid bruits; Distal pulses 2+ bilaterally Cardiac:  irregular rhythm tachycardic rate Lungs:  respirations unlabored  Abd: soft, nontender  Ext: no edema Musculoskeletal:  No deformities, BUE and BLE strength normal and  equal Skin: warm and dry  Neuro:  CNs 2-12 intact, no focal abnormalities noted Psych:  Normal affect   EKG:  The EKG was personally reviewed and demonstrates:  Afib with VR 93 Telemetry:  Telemetry was personally reviewed and demonstrates:  Afib with rates 110-180s  Relevant CV Studies:  Echo pending  Laboratory Data:  High Sensitivity Troponin:   Recent Labs  Lab 08/10/23 2253 08/11/23 0027  TROPONINIHS 8 10     Chemistry Recent Labs  Lab 08/10/23 2253 08/11/23 0542  NA 145 144  K 3.2* 4.0  CL 115* 113*  CO2 22 23  GLUCOSE 116* 101*  BUN 10 7*  CREATININE 0.73 0.73  CALCIUM  9.0 8.6*  MG  --  1.9  GFRNONAA >60 >60  ANIONGAP 8 8    Recent Labs  Lab 08/11/23 0542  PROT 5.5*  ALBUMIN 3.1*  AST 22  ALT 28  ALKPHOS 65  BILITOT 0.3   Lipids No results for input(s): "CHOL", "TRIG", "HDL", "LABVLDL", "LDLCALC", "CHOLHDL" in the last 168 hours.  Hematology Recent Labs  Lab 08/10/23 2253  WBC 5.8  RBC 4.68  HGB 14.0  HCT 41.4  MCV 88.5  MCH 29.9  MCHC 33.8  RDW 13.9  PLT 120*   Thyroid   Recent Labs  Lab 08/11/23 0543  TSH 7.219*    BNPNo results for input(s): "BNP", "PROBNP" in the last 168  hours.  DDimer No results for input(s): "DDIMER" in the last 168 hours.   Radiology/Studies:  DG Chest Port 1 View Result Date: 08/11/2023 CLINICAL DATA:  Fever. EXAM: PORTABLE CHEST 1 VIEW COMPARISON:  08/10/2023 FINDINGS: The lungs are clear without focal pneumonia, edema, pneumothorax or pleural effusion. Hyperexpansion suggests emphysema. Cardiopericardial silhouette is at upper limits of normal for size. No acute bony abnormality. Telemetry leads overlie the chest. IMPRESSION: Hyperexpansion without acute cardiopulmonary findings. Tiny lung nodules seen on chest CTA earlier today are not evident by x-ray. Electronically Signed   By: Donnal Fusi M.D.   On: 08/11/2023 06:56   CT Angio Chest PE W and/or Wo Contrast Result Date: 08/11/2023 CLINICAL DATA:   Syncope/presyncope, cerebrovascular cause suspected. Chest pain. History of lung cancer. EXAM: CT ANGIOGRAPHY CHEST WITH CONTRAST TECHNIQUE: Multidetector CT imaging of the chest was performed using the standard protocol during bolus administration of intravenous contrast. Multiplanar CT image reconstructions and MIPs were obtained to evaluate the vascular anatomy. RADIATION DOSE REDUCTION: This exam was performed according to the departmental dose-optimization program which includes automated exposure control, adjustment of the mA and/or kV according to patient size and/or use of iterative reconstruction technique. CONTRAST:  75mL OMNIPAQUE  IOHEXOL  350 MG/ML SOLN COMPARISON:  03/17/2023 FINDINGS: Cardiovascular: No filling defects in the pulmonary arteries to suggest pulmonary emboli. Heart is normal size. Aorta is normal caliber. Scattered coronary artery and aortic atherosclerosis. Mediastinum/Nodes: No mediastinal, hilar, or axillary adenopathy. Trachea and esophagus are unremarkable. Thyroid  unremarkable. Lungs/Pleura: Emphysema. Right upper lobe collapse again noted, unchanged. Stable linear scarring in the right middle lobe and lingula. No acute confluent airspace opacities or effusions. 3 mm left lower lobe pulmonary nodule on image 127 of series 6. This is new since prior study. 4 mm left upper lobe pulmonary nodule on image 35, also new. Upper Abdomen: No acute findings Musculoskeletal: Chest wall soft tissues are unremarkable. No acute bony abnormality. Review of the MIP images confirms the above findings. IMPRESSION: No evidence of pulmonary embolus. Scattered coronary artery disease. New small left upper lobe and left lower lobe pulmonary nodules measuring up to 4 mm. Given the patient's history of cancer, recommend follow-up CT in 6 months to assess stability. Stable right upper lobe collapse. Aortic Atherosclerosis (ICD10-I70.0) and Emphysema (ICD10-J43.9). Electronically Signed   By: Janeece Mechanic  M.D.   On: 08/11/2023 00:54   DG Chest 2 View Result Date: 08/10/2023 CLINICAL DATA:  Chest pain EXAM: CHEST - 2 VIEW COMPARISON:  03/08/2023 FINDINGS: Mild hyperinflation. Heart and mediastinal contours are within normal limits. No focal opacities or effusions. No acute bony abnormality. IMPRESSION: Hyperinflation.  No active cardiopulmonary disease. Electronically Signed   By: Janeece Mechanic M.D.   On: 08/10/2023 23:29     Assessment and Plan:   Atrial fibrillation with RVR - new diagnosis this admission - better rate controlled after 30 mg IV cardizem - will order cardizem gtt for now - discussed new Afib and options for treatment, including rate control +/- cardioversion on Monday - if TEE-DCCV, will need to start eliquis to get three doses before Monday, will leave on heparin  for now in case she develops additional chest pain prompting ischemic evaluation or if echo is abnormal with WMA or reduced EF   Chronic anticoagulation - has been started on heparin  gtt - would be a candidate for eliquis 5 mg BID - cost has already been checked   Chest pain Known nonobstructive CAD - hs troponin x 2 negative - EKG  with Afib  - no further chest pain, suspect this was due to RVR - will keep on heparin  until Sunday morning to ensure she will not need heart cath - continue to monitor, heparin  as above - continue ASA 81 mg   Nonobstructive CAD Hyperlipidemia with LDL goal < 70 06/27/2023: Cholesterol, Total 177; HDL 60; LDL Chol Calc (NIH) 95; Triglycerides 124 - on 20 mg crestor , may need to increase this   Mild aortic stenosis - repeat echo pending   COPD Hx of lung cancer - inhaler regimen per primary - may need to avoid albuterol , depending on if her Afib is challenging to rate control   Hypothyroidism - continue synthroid  - TSH elevated, will defer to PCP   Will attempt to rate control through the weekend. Can consider TEE-DCCV on Monday if she hasn't converted. She is  consented and added to the add-on list.    Informed Consent   Shared Decision Making/Informed Consent   The risks [stroke, cardiac arrhythmias rarely resulting in the need for a temporary or permanent pacemaker, skin irritation or burns, esophageal damage, perforation (1:10,000 risk), bleeding, pharyngeal hematoma as well as other potential complications associated with conscious sedation including aspiration, arrhythmia, respiratory failure and death], benefits (treatment guidance, restoration of normal sinus rhythm, diagnostic support) and alternatives of a transesophageal echocardiogram guided cardioversion were discussed in detail with Ms. Gosselin and she is willing to proceed.       Risk Assessment/Risk Scores:     TIMI Risk Score for Unstable Angina or Non-ST Elevation MI:   The patient's TIMI risk score is 2, which indicates a 8% risk of all cause mortality, new or recurrent myocardial infarction or need for urgent revascularization in the next 14 days.    CHA2DS2-VASc Score = 4   This indicates a 4.8% annual risk of stroke. The patient's score is based upon: CHF History: 0 HTN History: 1 Diabetes History: 0 Stroke History: 0 Vascular Disease History: 1 Age Score: 1 Gender Score: 1       For questions or updates, please contact [facility] Please consult [email or web address] for contact info under    Signed, Lamond Pilot, [place]  08/11/2023 11:23 AM   Attending Note:   The patient was seen and examined.  Agree with assessment and plan as noted above.  Changes made to the above note as needed.  Patient seen and independently examined with Bentley Bray, PA .   We discussed all aspects of the encounter. I agree with the assessment and plan as stated above.      Atrial fib with RVR .   New diagnosis of afib .    Echo shows normal LV function   She is on heparin  .  Will transition her to eliquis  Start Cardizem drip for rate control.   She may also benefit from  the addition of metoprolol    We will schedule her for a TEE/ Cardioversion on Monday .   2. Chest pain :  atypical for ACS.   Is likely due to the Afib with RVR . She exercises regularly without any issues . Troponins are negative   Will proceed with plans for cardioversion We can do ischemic work up after she is in SR       I have spent a total of 40 minutes with patient reviewing hospital  notes , telemetry, EKGs, labs and examining patient as well as establishing an assessment and plan that was discussed with the patient.  >  50% of time was spent in direct patient care.    [place], Marieta Shorten., MD, [facility] 08/11/2023, 1:48 PM [address]  [address] Office [phone] Pager [phone]

## 2023-08-11 NOTE — ED Notes (Signed)
 Transported patient to ER 33.

## 2023-08-11 NOTE — ED Notes (Signed)
 Private chat sent to on call provide

## 2023-08-11 NOTE — Plan of Care (Signed)
   Problem: Education: Goal: Knowledge of General Education information will improve Description Including pain rating scale, medication(s)/side effects and non-pharmacologic comfort measures Outcome: Progressing

## 2023-08-11 NOTE — ED Notes (Signed)
 Pt HR fluctuating from 110-155.  Asymptomatic.

## 2023-08-11 NOTE — H&P (Addendum)
 History and Physical    Siobahn Aden QMV:[id number] DOB: 1955-07-01 DOA: 08/10/2023  Patient coming from: Home.  Chief Complaint: Chest pain.  HPI: Katrina Campbell is a 68 y.o. female with history of nonobstructive CAD per cardiac cath done in 2019, COPD, stage III non-small cell lung cancer being followed by Dr. Marguerita Shih oncologist, hypothyroidism presents to the ER with complaints of chest pain.  Patient states she was doing fine and recently traveled to the beach and come back and last night after going to the bed she started developing substernal chest pressure nonradiating which persisted and she decided to come to the ER.  There was no associated shortness of breath or palpitations.  Patient was recently placed on prednisone  5-day therapy for the right upper extremity pain secondary to possible cervical radiculopathy by patient's primary care physician last week.  ED Course: In the ER patient was found to be in A-fib with RVR was given fluid bolus following which heart rate improved.  Patient was placed on heparin  infusion.  CT angiogram of the chest was negative for PE did show some lung nodules and coronary calcifications.  Troponins came back negative.  At the time of my exam patient is chest pain-free but still in A-fib and heart rate is around 120 bpm.  Review of Systems: As per HPI, rest all negative.   Past Medical History:  Diagnosis Date   Anxiety    COPD (chronic obstructive pulmonary disease) (HCC)    Coronary artery disease    Dental staining 07/15/2019   Depression    History of radiation therapy    Right lung- 11/03/20-12/15/20- Dr. 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    Past Surgical History:  Procedure Laterality Date   BALLOON DILATION N/A 04/29/2021   Procedure: BALLOON DILATION;  Surgeon: Janel Medford, MD;  Location: [facility];  Service:  Endoscopy;  Laterality: N/A;   BIOPSY  01/14/2021   Procedure: BIOPSY;  Surgeon: Prudy Brownie, DO;  Location: [facility];  Service: Pulmonary;;   BRONCHIAL BIOPSY  10/08/2020   Procedure: BRONCHIAL BIOPSIES;  Surgeon: Prudy Brownie, DO;  Location: [facility];  Service: Pulmonary;;   BRONCHIAL BRUSHINGS  10/08/2020   Procedure: BRONCHIAL BRUSHINGS;  Surgeon: Prudy Brownie, DO;  Location: [facility];  Service: Pulmonary;;   BRONCHIAL BRUSHINGS  01/14/2021   Procedure: BRONCHIAL BRUSHINGS;  Surgeon: Prudy Brownie, DO;  [facility]: [facility];  Service: Pulmonary;;   BRONCHIAL WASHINGS  10/08/2020   Procedure: BRONCHIAL WASHINGS;  Surgeon: Prudy Brownie, DO;  [facility]: [facility];  Service: Pulmonary;;   BRONCHIAL WASHINGS  01/14/2021   Procedure: BRONCHIAL WASHINGS;  Surgeon: Prudy Brownie, DO;  [facility]: [facility];  Service: Pulmonary;;   ESOPHAGOGASTRODUODENOSCOPY (EGD) WITH PROPOFOL  N/A 04/29/2021   Procedure: ESOPHAGOGASTRODUODENOSCOPY (EGD) WITH PROPOFOL ;  Surgeon: Janel Medford, MD;  Location: [facility];  Service: Endoscopy;  Laterality: N/A;   FINE NEEDLE ASPIRATION  10/08/2020   Procedure: FINE NEEDLE ASPIRATION (FNA) LINEAR;  Surgeon: Prudy Brownie, DO;  [facility];  Service: Pulmonary;;   LEFT HEART CATH AND CORONARY ANGIOGRAPHY N/A 11/07/2017   Procedure: LEFT HEART CATH AND CORONARY ANGIOGRAPHY;  Surgeon: Sammy Crisp, MD;  [facility];  Service: Cardiovascular;  Laterality: N/A;   NASAL SINUS SURGERY  1986   VIDEO BRONCHOSCOPY Bilateral 01/29/2019   Procedure: VIDEO BRONCHOSCOPY  WITH FLUORO;  Surgeon: Mannam, Praveen, MD;  Location: [facility];  Service: Cardiopulmonary;  Laterality: Bilateral;   VIDEO BRONCHOSCOPY Right 01/14/2021   Procedure: VIDEO BRONCHOSCOPY WITHOUT FLUORO;  Surgeon: Prudy Brownie, DO;  [facility];  Service: Pulmonary;  Laterality: Right;  possible cryotherapy   VIDEO BRONCHOSCOPY  WITH ENDOBRONCHIAL ULTRASOUND N/A 10/08/2020   Procedure: VIDEO BRONCHOSCOPY WITH ENDOBRONCHIAL ULTRASOUND;  Surgeon: Prudy Brownie, DO;  Location[facility];  Service: Pulmonary;  Laterality: N/A;     reports that she quit smoking about 10 years ago. Her smoking use included cigarettes. She started smoking about 54 years ago. She has a 77 pack-year smoking history. She has never used smokeless tobacco. She reports that she does not currently use alcohol after a past usage of about 24.0 standard drinks of alcohol per week. She reports that she does not currently use drugs.  Allergies  Allergen Reactions   Breztri  Aerosphere [Budeson-Glycopyrrol-Formoterol ] Shortness Of Breath   Codeine Other (See Comments)    Causes pt to blackout   Statins     Myalgias at higher doses    Family History  Problem Relation Age of Onset   Heart attack Mother    Emphysema Mother    Congestive Heart Failure Mother    Heart attack Father    Congestive Heart Failure Father    Asthma Other    Lung disease Sister    Lung disease Brother    Colon cancer Neg Hx     Prior to Admission medications   Medication Sig Start Date End Date Taking? Authorizing Provider  acetaminophen  (TYLENOL ) 500 MG tablet Take 500 mg by mouth every 6 (six) hours as needed for moderate pain or headache.   Yes [provider]  albuterol  (PROVENTIL ) (2.5 MG/3ML) 0.083% nebulizer solution INHALE 3 MLS (2.5 MG TOTAL) VIA NEBULIZER EVERY 6 HOURS AS NEEDED FOR WHEEZING OR SHORTNESS OF BREATH 03/30/23  Yes Icard, Bradley L, DO  aspirin  EC 81 MG tablet Take 81 mg by mouth daily with lunch.    Yes [provider]  Cholecalciferol (DIALYVITE VITAMIN D  5000) 125 MCG (5000 UT) capsule Take 5,000 Units by mouth daily.   Yes [provider]  esomeprazole  (NEXIUM ) 40 MG capsule TAKE 1 CAPSULE DAILY 07/11/23  Yes Yolanda Hence S, FNP  ferrous sulfate 325 (65 FE) MG tablet Take 325 mg by mouth every other day.   Yes  [provider]  Guaifenesin  (MUCINEX  MAXIMUM STRENGTH) 1200 MG TB12 Take 1,200 mg by mouth daily.   Yes [provider]  levothyroxine  (SYNTHROID ) 88 MCG tablet TAKE 1 TABLET(88 MCG) BY MOUTH DAILY 09/19/22  Yes Tysinger, Christiane Cowing, PA-C  Multiple Vitamins-Minerals (IMMUNE SUPPORT PO) Take 1 capsule by mouth daily.   Yes [provider]  rosuvastatin  (CRESTOR ) 20 MG tablet Take 1 tablet (20 mg total) by mouth daily. Must keep scheduled appointment for future refills. Thank you. 05/20/23  Yes Tolia, Sunit, DO  temazepam  (RESTORIL ) 15 MG capsule TAKE 1 CAPSULE AT BEDTIME AS NEEDED Patient taking differently: Take 15 mg by mouth at bedtime. 05/17/23  Yes TysingerChristiane Cowing, PA-C  TRELEGY ELLIPTA  100-62.5-25 MCG/ACT AEPB USE 1 INHALATION DAILY 04/15/23  Yes Icard, Lucie Ruts, DO  vitamin B-12 (CYANOCOBALAMIN) 1000 MCG tablet Take 1,000 mcg by mouth daily.   Yes [provider]  albuterol  (VENTOLIN  HFA) 108 (90 Base) MCG/ACT inhaler Inhale 2 puffs into the lungs every 6 (six) hours as needed for wheezing or shortness of breath.  Patient not taking: Reported on 08/11/2023 06/07/23   Denson Flake, MD  predniSONE  (DELTASONE ) 20 MG tablet 3 tabs poqday 1-2, 2 tabs poqday 3-4, 1 tab poqday 5-6 Patient not taking: Reported on 08/11/2023 07/27/23   Austine Lefort, MD  Respiratory Therapy Supplies (FLUTTER) DEVI Use as directed 06/14/19   Prudy Brownie, DO    Physical Exam: Constitutional: Moderately built and nourished. Vitals:   08/11/23 0115 08/11/23 0130 08/11/23 0215 08/11/23 0311  BP: 95/60 99/73 96/74    Pulse: (!) 45 68 (!) 119   Resp: 18 18 19    Temp:    97.6 F (36.4 C)  TempSrc:    Oral  SpO2: 98% 99% 98%   Weight:      Height:       Eyes: Anicteric no pallor. ENMT: No discharge from the ears eyes nose and mouth. Neck: No mass felt.  No neck rigidity. Respiratory: No rhonchi or crepitations. Cardiovascular: S1-S2 heard. Abdomen: Nontender bowel sound  present. Musculoskeletal: No edema. Skin: No rash. Neurologic: Alert awake oriented to time place and person.  Moves all extremities. Psychiatric: Appears normal.  Normal affect.   Labs on Admission: I have personally reviewed following labs and imaging studies  CBC: Recent Labs  Lab 08/10/23 2253  WBC 5.8  HGB 14.0  HCT 41.4  MCV 88.5  PLT 120*   Basic Metabolic Panel: Recent Labs  Lab 08/10/23 2253  NA 145  K 3.2*  CL 115*  CO2 22  GLUCOSE 116*  BUN 10  CREATININE 0.73  CALCIUM  9.0   GFR: Estimated Creatinine Clearance: 74.5 mL/min (by C-G formula based on SCr of 0.73 mg/dL). Liver Function Tests: No results for input(s): "AST", "ALT", "ALKPHOS", "BILITOT", "PROT", "ALBUMIN" in the last 168 hours. No results for input(s): "LIPASE", "AMYLASE" in the last 168 hours. No results for input(s): "AMMONIA" in the last 168 hours. Coagulation Profile: No results for input(s): "INR", "PROTIME" in the last 168 hours. Cardiac Enzymes: No results for input(s): "CKTOTAL", "CKMB", "CKMBINDEX", "TROPONINI" in the last 168 hours. BNP (last 3 results) No results for input(s): "PROBNP" in the last 8760 hours. HbA1C: No results for input(s): "HGBA1C" in the last 72 hours. CBG: No results for input(s): "GLUCAP" in the last 168 hours. Lipid Profile: No results for input(s): "CHOL", "HDL", "LDLCALC", "TRIG", "CHOLHDL", "LDLDIRECT" in the last 72 hours. Thyroid  Function Tests: No results for input(s): "TSH", "T4TOTAL", "FREET4", "T3FREE", "THYROIDAB" in the last 72 hours. Anemia Panel: No results for input(s): "VITAMINB12", "FOLATE", "FERRITIN", "TIBC", "IRON", "RETICCTPCT" in the last 72 hours. Urine analysis:    Component Value Date/Time   COLORURINE YELLOW 12/30/2015 1536   APPEARANCEUR CLEAR 12/30/2015 1536   LABSPEC 1.017 12/30/2015 1536   PHURINE 7.0 12/30/2015 1536   GLUCOSEU NEGATIVE 12/30/2015 1536   HGBUR NEGATIVE 12/30/2015 1536   BILIRUBINUR negative 03/06/2020  0839   KETONESUR negative 03/06/2020 0839   KETONESUR NEGATIVE 12/30/2015 1536   PROTEINUR negative 03/06/2020 0839   PROTEINUR NEGATIVE 12/30/2015 1536   UROBILINOGEN 0.2 03/06/2020 0839   UROBILINOGEN 0.2 07/17/2013 1024   NITRITE Negative 03/06/2020 0839   NITRITE NEGATIVE 12/30/2015 1536   LEUKOCYTESUR Negative 03/06/2020 0839   Sepsis Labs: @LABRCNTIP (procalcitonin:4,lacticidven:4) )No results found for this or any previous visit (from the past 240 hours).   Radiological Exams on Admission: CT Angio Chest PE W and/or Wo Contrast Result Date: 08/11/2023 CLINICAL DATA:  Syncope/presyncope, cerebrovascular cause suspected. Chest pain. History of lung cancer. EXAM: CT ANGIOGRAPHY CHEST WITH CONTRAST  TECHNIQUE: Multidetector CT imaging of the chest was performed using the standard protocol during bolus administration of intravenous contrast. Multiplanar CT image reconstructions and MIPs were obtained to evaluate the vascular anatomy. RADIATION DOSE REDUCTION: This exam was performed according to the departmental dose-optimization program which includes automated exposure control, adjustment of the mA and/or kV according to patient size and/or use of iterative reconstruction technique. CONTRAST:  75mL OMNIPAQUE  IOHEXOL  350 MG/ML SOLN COMPARISON:  03/17/2023 FINDINGS: Cardiovascular: No filling defects in the pulmonary arteries to suggest pulmonary emboli. Heart is normal size. Aorta is normal caliber. Scattered coronary artery and aortic atherosclerosis. Mediastinum/Nodes: No mediastinal, hilar, or axillary adenopathy. Trachea and esophagus are unremarkable. Thyroid  unremarkable. Lungs/Pleura: Emphysema. Right upper lobe collapse again noted, unchanged. Stable linear scarring in the right middle lobe and lingula. No acute confluent airspace opacities or effusions. 3 mm left lower lobe pulmonary nodule on image 127 of series 6. This is new since prior study. 4 mm left upper lobe pulmonary nodule on  image 35, also new. Upper Abdomen: No acute findings Musculoskeletal: Chest wall soft tissues are unremarkable. No acute bony abnormality. Review of the MIP images confirms the above findings. IMPRESSION: No evidence of pulmonary embolus. Scattered coronary artery disease. New small left upper lobe and left lower lobe pulmonary nodules measuring up to 4 mm. Given the patient's history of cancer, recommend follow-up CT in 6 months to assess stability. Stable right upper lobe collapse. Aortic Atherosclerosis (ICD10-I70.0) and Emphysema (ICD10-J43.9). Electronically Signed   By: Janeece Mechanic M.D.   On: 08/11/2023 00:54   DG Chest 2 View Result Date: 08/10/2023 CLINICAL DATA:  Chest pain EXAM: CHEST - 2 VIEW COMPARISON:  03/08/2023 FINDINGS: Mild hyperinflation. Heart and mediastinal contours are within normal limits. No focal opacities or effusions. No acute bony abnormality. IMPRESSION: Hyperinflation.  No active cardiopulmonary disease. Electronically Signed   By: Janeece Mechanic M.D.   On: 08/10/2023 23:29    EKG: Independently reviewed.  A-fib with RVR.  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  I have ordered 1 dose of Cardizem 30 mg p.o.  Will check TSH 2D echo patient is presently on heparin  infusion for anticoagulation.  CHADS2 Vasc score is at least 2.  I did consult cardiology. Chest pain could be from A-fib with RVR.  Patient did have nonobstructive CAD per 2019 cardiac cath.  Check 2D echo.  Presently on heparin  infusion and statins.  CT scan did show coronary calcifications.  Will await further  recommendations from cardiology. Stage III non-small cell lung cancer being followed by Dr. Marguerita Shih.  CT scan today did show lung nodules will need follow-up with oncologist. Hypothyroidism after radiation on Synthroid .  Check TSH. Hyperlipidemia on statins. Hypokalemia replace recheck. Thrombocytopenia appears to be chronic. COPD not actively wheezing continue Trelegy. GERD on PPI.  Since patient has new onset A-fib with RVR with chest pain will need close monitoring further workup and more than 2 midnight stay.   DVT prophylaxis: Heparin  infusion. Code Status: Full code. Family Communication: Discussed with patient. Disposition Plan: Progressive care. Consults called: Cardiology.  Admission status: Observation.

## 2023-08-11 NOTE — TOC CM/SW Note (Signed)
 Transition of Care [facility]) - Inpatient Brief Assessment   Patient Details  Name: Katrina Campbell MRN: [id number] Date of Birth: Jan 07, 1956  [facility]) CM/SW Contact:    Jennett Model, RN Phone Number: 08/11/2023, 3:11 PM   Clinical Narrative: From home with spouse, has PCP and insurance on file, states has no HH services in place at this time , has a nebulizer machine at home.  States family member will transport them home at [employer] and family is support system, states gets medications from [place] [facility] [place] and she uses [facility].  Pta self ambulatory.  Patient gives this NCM permission to speak with spouse.   Transition of Care Asessment: Insurance and Status: Insurance coverage has been reviewed Patient has primary care physician: Yes Home environment has been reviewed: home with spouse Prior level of function:: indep Prior/Current Home Services: Current home services (neb machine) Social Drivers of Health Review: SDOH reviewed no interventions necessary Readmission risk has been reviewed: Yes Transition of care needs: no transition of care needs at this time

## 2023-08-11 NOTE — ED Notes (Signed)
Transported patient to CT.

## 2023-08-11 NOTE — Progress Notes (Signed)
   08/11/23 1142  Spiritual Encounters  Type of Visit Initial  Care provided to: Patient;Pt and family (husband)  Conversation partners present during Programmer, systems  Reason for visit Advance directives   Chaplain responded to Spiritual Consult for Advance Care Directive.  Chaplain arrived in room and delivered ACD education. Instructed Pt how to reach out to Spiritual Care if they have any questions and to contact us  when they are ready to move forward.  Chaplain services remain available by Spiritual Consult or for emergent cases, paging [phone]  Chaplain Dewitte Foreman, MDiv [email or web address] [phone]

## 2023-08-11 NOTE — Progress Notes (Signed)
 PHARMACY - ANTICOAGULATION CONSULT NOTE  Pharmacy Consult for heparin  Indication: atrial fibrillation  Allergies  Allergen Reactions   Breztri  Aerosphere [Budeson-Glycopyrrol-Formoterol ] Shortness Of Breath   Codeine Other (See Comments)    Causes pt to blackout   Prednisone  Other (See Comments)    Passed out   Statins     Myalgias at higher doses    Patient Measurements: Height: 5\' 4"  (162.6 cm) Weight: 95.8 kg (211 lb 3.2 oz) IBW/kg (Calculated) : 54.7 HEPARIN  DW (KG): 76.6  Vital Signs: Temp: 97.8 F (36.6 C) (05/08 2314) Temp Source: Oral (05/08 2314) BP: 99/73 (05/09 0130) Pulse Rate: 68 (05/09 0130)  Labs: Recent Labs    08/10/23 2253 08/11/23 0027  HGB 14.0  --   HCT 41.4  --   PLT 120*  --   CREATININE 0.73  --   TROPONINIHS 8 10    Estimated Creatinine Clearance: 74.5 mL/min (by C-G formula based on SCr of 0.73 mg/dL).   Medical History: Past Medical History:  Diagnosis Date   Anxiety    COPD (chronic obstructive pulmonary disease) (HCC)    Coronary artery disease    Dental staining 07/15/2019   Depression    History of radiation therapy    Right lung- 11/03/20-12/15/20- Dr. 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  Goal of Therapy:  Heparin  level 0.3-0.7 units/ml Monitor platelets by anticoagulation protocol: Yes   Plan:  Heparin  4000 units IV bolus followed by infusion at 1000 units/hr Monitor heparin  levels and CBC.  Lonnie Roberts, PharmD, BCPS  08/11/2023,2:16 AM

## 2023-08-11 NOTE — ED Notes (Addendum)
 Rec'ed patient to room 39 a/o no acute distress noted. Vss cardiac monitoring in progress afib noted. Sp02 99% on RA. B/L Rhonchi noted. Patient has productive cough + BS noted. Skin D&I Heparin  1000 units/hr (35ml/hr) started at 0310 via wrist#20 . Patient has RAC#18 IVL. Patient ambulates steady. Patient had increased SOB when ambulating to bathroom. All needs met safety and comfort maintained siderails up x2 callbell within reach. Will monitor.

## 2023-08-11 NOTE — ED Notes (Signed)
 Called CT, unable to take patient at this time per CT tech, will attempt again.

## 2023-08-11 NOTE — ED Notes (Signed)
 CCMD made aware that pt has been moved off the floor

## 2023-08-11 NOTE — ED Provider Notes (Signed)
 [facility]ERGENC[facility] DEPARTMENT AT [facility] Provider Note   CSN: [id number] Arrival date & time: 08/10/23  2248     History  Chief Complaint  Patient presents with   Chest Pain    Katrina Campbell is a 68 y.o. female.  The history is provided by the patient.  Chest Pain Pain location:  Substernal area Pain quality: sharp   Pain radiates to:  Does not radiate Pain severity:  Moderate Onset quality:  Sudden Timing:  Constant Progression:  Resolved Chronicity:  New Context: at rest   Relieved by:  Nothing Worsened by:  Nothing Ineffective treatments:  None tried Associated symptoms: cough   Associated symptoms: no fever and no vomiting   Risk factors: coronary artery disease        Home Medications Prior to Admission medications   Medication Sig Start Date End Date Taking? Authorizing Provider  acetaminophen  (TYLENOL ) 500 MG tablet Take 500 mg by mouth every 6 (six) hours as needed for moderate pain or headache.    [provider]  albuterol  (PROVENTIL ) (2.5 MG/3ML) 0.083% nebulizer solution INHALE 3 MLS (2.5 MG TOTAL) VIA NEBULIZER EVERY 6 HOURS AS NEEDED FOR WHEEZING OR SHORTNESS OF BREATH 03/30/23   Icard, Lucie Ruts, DO  albuterol  (VENTOLIN  HFA) 108 (90 Base) MCG/ACT inhaler Inhale 2 puffs into the lungs every 6 (six) hours as needed for wheezing or shortness of breath. 06/07/23   Byrum, Robert S, MD  aspirin  EC 81 MG tablet Take 81 mg by mouth daily with lunch.     [provider]  Cholecalciferol (DIALYVITE VITAMIN D  5000) 125 MCG (5000 UT) capsule Take 5,000 Units by mouth daily.    [provider]  esomeprazole  (NEXIUM ) 40 MG capsule TAKE 1 CAPSULE DAILY 07/11/23   Jenelle Mis, FNP  ferrous sulfate 325 (65 FE) MG tablet Take 325 mg by mouth every other day.    [provider]  Guaifenesin  (MUCINEX  MAXIMUM STRENGTH) 1200 MG TB12 Take 1,200 mg by mouth 2 (two) times daily.    [provider]  ipratropium-albuterol   (DUONEB) 0.5-2.5 (3) MG/3ML SOLN Take 3 mLs by nebulization every 6 (six) hours as needed. 03/27/23   Tysinger, Christiane Cowing, PA-C  levothyroxine  (SYNTHROID ) 88 MCG tablet TAKE 1 TABLET(88 MCG) BY MOUTH DAILY 09/19/22   Tysinger, Christiane Cowing, PA-C  Multiple Vitamins-Minerals (IMMUNE SUPPORT PO) Take 2 tablets by mouth daily.    [provider]  predniSONE  (DELTASONE ) 20 MG tablet 3 tabs poqday 1-2, 2 tabs poqday 3-4, 1 tab poqday 5-6 07/27/23   Austine Lefort, MD  Respiratory Therapy Supplies (FLUTTER) DEVI Use as directed 06/14/19   Prudy Brownie, DO  rosuvastatin  (CRESTOR ) 20 MG tablet Take 1 tablet (20 mg total) by mouth daily. Must keep scheduled appointment for future refills. Thank you. 05/20/23   Olinda Bertrand, DO  temazepam  (RESTORIL ) 15 MG capsule TAKE 1 CAPSULE AT BEDTIME AS NEEDED 05/17/23   Claudene Crystal, PA-C  TRELEGY ELLIPTA  100-62.5-25 MCG/ACT AEPB USE 1 INHALATION DAILY 04/15/23   Icard, Lucie Ruts, DO  vitamin B-12 (CYANOCOBALAMIN) 1000 MCG tablet Take 1,000 mcg by mouth daily.    [provider]      Allergies    Breztri  aerosphere [budeson-glycopyrrol-formoterol ], Codeine, Prednisone , and Statins    Review of Systems   Review of Systems  Constitutional:  Negative for fever.  HENT:  Negative for facial swelling.   Respiratory:  Positive for cough.   Cardiovascular:  Positive for chest  pain.  Gastrointestinal:  Negative for vomiting.  All other systems reviewed and are negative.   Physical Exam Updated Vital Signs BP 99/73   Pulse 68   Temp 97.8 F (36.6 C) (Oral)   Resp 18   Ht 5\' 4"  (1.626 m)   Wt 95.8 kg   SpO2 99%   BMI 36.25 kg/m  Physical Exam Vitals and nursing note reviewed.  Constitutional:      General: She is not in acute distress.    Appearance: Normal appearance. She is well-developed.  HENT:     Head: Normocephalic and atraumatic.     Nose: Nose normal.  Eyes:     Pupils: Pupils are equal, round, and reactive to light.   Cardiovascular:     Rate and Rhythm: Normal rate. Rhythm irregular.     Pulses: Normal pulses.     Heart sounds: Normal heart sounds.  Pulmonary:     Effort: Pulmonary effort is normal. No respiratory distress.     Breath sounds: Normal breath sounds.  Abdominal:     General: Bowel sounds are normal. There is no distension.     Palpations: Abdomen is soft.     Tenderness: There is no abdominal tenderness. There is no guarding or rebound.  Musculoskeletal:        General: Normal range of motion.     Cervical back: Normal range of motion and neck supple.  Skin:    General: Skin is warm and dry.     Capillary Refill: Capillary refill takes less than 2 seconds.     Findings: No erythema or rash.  Neurological:     General: No focal deficit present.     Mental Status: She is alert and oriented to person, place, and time.     Deep Tendon Reflexes: Reflexes normal.  Psychiatric:        Mood and Affect: Mood normal.     ED Results / Procedures / Treatments   Labs (all labs ordered are listed, but only abnormal results are displayed) Results for orders placed or performed during the hospital encounter of 08/10/23  Basic metabolic panel   Collection Time: 08/10/23 10:53 PM  Result Value Ref Range   Sodium 145 135 - 145 mmol/L   Potassium 3.2 (L) 3.5 - 5.1 mmol/L   Chloride 115 (H) 98 - 111 mmol/L   CO2 22 22 - 32 mmol/L   Glucose, Bld 116 (H) 70 - 99 mg/dL   BUN 10 8 - 23 mg/dL   Creatinine, Ser 9.60 0.44 - 1.00 mg/dL   Calcium  9.0 8.9 - 10.3 mg/dL   GFR, Estimated >45 >40 mL/min   Anion gap 8 5 - 15  CBC   Collection Time: 08/10/23 10:53 PM  Result Value Ref Range   WBC 5.8 4.0 - 10.5 K/uL   RBC 4.68 3.87 - 5.11 MIL/uL   Hemoglobin 14.0 12.0 - 15.0 g/dL   HCT 98.1 19.1 - 47.8 %   MCV 88.5 80.0 - 100.0 fL   MCH 29.9 26.0 - 34.0 pg   MCHC 33.8 30.0 - 36.0 g/dL   RDW 29.5 62.1 - 30.8 %   Platelets 120 (L) 150 - 400 K/uL   nRBC 0.0 0.0 - 0.2 %  Troponin I (High  Sensitivity)   Collection Time: 08/10/23 10:53 PM  Result Value Ref Range   Troponin I (High Sensitivity) 8 <18 ng/L  Troponin I (High Sensitivity)   Collection Time: 08/11/23 12:27 AM  Result Value Ref  Range   Troponin I (High Sensitivity) 10 <18 ng/L   CT Angio Chest PE W and/or Wo Contrast Result Date: 08/11/2023 CLINICAL DATA:  Syncope/presyncope, cerebrovascular cause suspected. Chest pain. History of lung cancer. EXAM: CT ANGIOGRAPHY CHEST WITH CONTRAST TECHNIQUE: Multidetector CT imaging of the chest was performed using the standard protocol during bolus administration of intravenous contrast. Multiplanar CT image reconstructions and MIPs were obtained to evaluate the vascular anatomy. RADIATION DOSE REDUCTION: This exam was performed according to the departmental dose-optimization program which includes automated exposure control, adjustment of the mA and/or kV according to patient size and/or use of iterative reconstruction technique. CONTRAST:  75mL OMNIPAQUE  IOHEXOL  350 MG/ML SOLN COMPARISON:  03/17/2023 FINDINGS: Cardiovascular: No filling defects in the pulmonary arteries to suggest pulmonary emboli. Heart is normal size. Aorta is normal caliber. Scattered coronary artery and aortic atherosclerosis. Mediastinum/Nodes: No mediastinal, hilar, or axillary adenopathy. Trachea and esophagus are unremarkable. Thyroid  unremarkable. Lungs/Pleura: Emphysema. Right upper lobe collapse again noted, unchanged. Stable linear scarring in the right middle lobe and lingula. No acute confluent airspace opacities or effusions. 3 mm left lower lobe pulmonary nodule on image 127 of series 6. This is new since prior study. 4 mm left upper lobe pulmonary nodule on image 35, also new. Upper Abdomen: No acute findings Musculoskeletal: Chest wall soft tissues are unremarkable. No acute bony abnormality. Review of the MIP images confirms the above findings. IMPRESSION: No evidence of pulmonary embolus. Scattered  coronary artery disease. New small left upper lobe and left lower lobe pulmonary nodules measuring up to 4 mm. Given the patient's history of cancer, recommend follow-up CT in 6 months to assess stability. Stable right upper lobe collapse. Aortic Atherosclerosis (ICD10-I70.0) and Emphysema (ICD10-J43.9). Electronically Signed   By: Janeece Mechanic M.D.   On: 08/11/2023 00:54   DG Chest 2 View Result Date: 08/10/2023 CLINICAL DATA:  Chest pain EXAM: CHEST - 2 VIEW COMPARISON:  03/08/2023 FINDINGS: Mild hyperinflation. Heart and mediastinal contours are within normal limits. No focal opacities or effusions. No acute bony abnormality. IMPRESSION: Hyperinflation.  No active cardiopulmonary disease. Electronically Signed   By: Janeece Mechanic M.D.   On: 08/10/2023 23:29   DG Cervical Spine Complete Result Date: 08/09/2023 CLINICAL DATA:  cervical radiculopathy EXAM: CERVICAL SPINE - COMPLETE 4+ VIEW COMPARISON:  March 17, 2023. FINDINGS: The cervical spine is visualized from C1-C7. Straightening of the cervical lordosis. No significant spondylolisthesis. Vertebral body heights are maintained: no evidence of acute fracture. Moderate intervertebral disc space height loss of C6-7. Mild intervertebral disc space height loss at C4-5 and C5-6. Multilevel uncovertebral hypertrophy most pronounced at C6-7. Uncovertebral hypertrophy and facet arthropathy results in moderate to severe LEFT neuroforaminal narrowing at C6-7, moderate neuroforaminal narrowing of LEFT C5-6, LEFT C4-5, RIGHT C6-7 and C5-6. No prevertebral soft tissue swelling. RIGHT upper lobe paramediastinal opacity consistent with known RIGHT upper lobe collapse. IMPRESSION: 1. Multilevel degenerative changes of the cervical spine most pronounced at C6-7 where there is moderate to severe LEFT neuroforaminal narrowing. 2. RIGHT upper lobe paramediastinal opacity consistent with known RIGHT upper lobe collapse with known history of treated pulmonary malignancy.  Electronically Signed   By: Clancy Crimes M.D.   On: 08/09/2023 08:05    EKG EKG Interpretation Date/Time:  Thursday Aug 10 2023 22:52:45 EDT Ventricular Rate:  93 PR Interval:    QRS Duration:  66 QT Interval:  356 QTC Calculation: 442 R Axis:   60  Text Interpretation: Atrial fibrillation Possible Anterior infarct , age undetermined  Confirmed by Anaalicia Reimann ([id number]) on 08/10/2023 11:02:34 PM  Radiology CT Angio Chest PE W and/or Wo Contrast Result Date: 08/11/2023 CLINICAL DATA:  Syncope/presyncope, cerebrovascular cause suspected. Chest pain. History of lung cancer. EXAM: CT ANGIOGRAPHY CHEST WITH CONTRAST TECHNIQUE: Multidetector CT imaging of the chest was performed using the standard protocol during bolus administration of intravenous contrast. Multiplanar CT image reconstructions and MIPs were obtained to evaluate the vascular anatomy. RADIATION DOSE REDUCTION: This exam was performed according to the departmental dose-optimization program which includes automated exposure control, adjustment of the mA and/or kV according to patient size and/or use of iterative reconstruction technique. CONTRAST:  75mL OMNIPAQUE  IOHEXOL  350 MG/ML SOLN COMPARISON:  03/17/2023 FINDINGS: Cardiovascular: No filling defects in the pulmonary arteries to suggest pulmonary emboli. Heart is normal size. Aorta is normal caliber. Scattered coronary artery and aortic atherosclerosis. Mediastinum/Nodes: No mediastinal, hilar, or axillary adenopathy. Trachea and esophagus are unremarkable. Thyroid  unremarkable. Lungs/Pleura: Emphysema. Right upper lobe collapse again noted, unchanged. Stable linear scarring in the right middle lobe and lingula. No acute confluent airspace opacities or effusions. 3 mm left lower lobe pulmonary nodule on image 127 of series 6. This is new since prior study. 4 mm left upper lobe pulmonary nodule on image 35, also new. Upper Abdomen: No acute findings Musculoskeletal: Chest wall soft  tissues are unremarkable. No acute bony abnormality. Review of the MIP images confirms the above findings. IMPRESSION: No evidence of pulmonary embolus. Scattered coronary artery disease. New small left upper lobe and left lower lobe pulmonary nodules measuring up to 4 mm. Given the patient's history of cancer, recommend follow-up CT in 6 months to assess stability. Stable right upper lobe collapse. Aortic Atherosclerosis (ICD10-I70.0) and Emphysema (ICD10-J43.9). Electronically Signed   By: Janeece Mechanic M.D.   On: 08/11/2023 00:54   DG Chest 2 View Result Date: 08/10/2023 CLINICAL DATA:  Chest pain EXAM: CHEST - 2 VIEW COMPARISON:  03/08/2023 FINDINGS: Mild hyperinflation. Heart and mediastinal contours are within normal limits. No focal opacities or effusions. No acute bony abnormality. IMPRESSION: Hyperinflation.  No active cardiopulmonary disease. Electronically Signed   By: Janeece Mechanic M.D.   On: 08/10/2023 23:29    Procedures .Critical Care  Performed by: Tonya Fredrickson, MD Authorized by: Tonya Fredrickson, MD   Critical care provider statement:    Critical care time (minutes):  30   Critical care end time:  08/11/2023 2:39 AM   Critical care was necessary to treat or prevent imminent or life-threatening deterioration of the following conditions:  Circulatory failure and cardiac failure   Critical care was time spent personally by me on the following activities:  Development of treatment plan with patient or surrogate, discussions with consultants, evaluation of patient's response to treatment, examination of patient, ordering and review of laboratory studies, ordering and review of radiographic studies, ordering and performing treatments and interventions, pulse oximetry, re-evaluation of patient's condition and review of old charts   I assumed direction of critical care for this patient from another provider in my specialty: no     Care discussed with: admitting provider       Medications  Ordered in ED Medications  heparin  bolus via infusion 4,000 Units (has no administration in time range)  heparin  ADULT infusion 100 units/mL (25000 units/250mL) (has no administration in time range)  sodium chloride  0.9 % bolus 500 mL (0 mLs Intravenous Stopped 08/10/23 2339)  iohexol  (OMNIPAQUE ) 350 MG/ML injection 75 mL (75 mLs Intravenous Contrast Given 08/11/23 0049)  sodium chloride  0.9 %  bolus 500 mL (500 mLs Intravenous New Bag/Given 08/11/23 0208)    ED Course/ Medical Decision Making/ A&P                                 Medical Decision Making Chest pain, given IV cardizem by EMS for afib   Amount and/or Complexity of Data Reviewed Independent Historian: EMS    Details: See above  External Data Reviewed: notes.    Details: Previous notes reviewed  Labs: ordered.    Details: White count normal 4.8, normal hemoglobin 14, normal platelets.  Normal troponin 8/10.  Normal sodium 145 and potassium slight low 3.2  Radiology: ordered and independent interpretation performed.    Details: Pulmonary nodules by me on CTA ECG/medicine tests: ordered and independent interpretation performed. Decision-making details documented in ED Course.  Risk Prescription drug management. Decision regarding hospitalization.    Final Clinical Impression(s) / ED Diagnoses Final diagnoses:  Lung nodules  Atrial fibrillation, unspecified type (HCC)  Chest pain, unspecified type   The patient appears reasonably stabilized for admission considering the current resources, flow, and capabilities available in the ED at this time, and I doubt any other [place] requiring further screening and/or treatment in the ED prior to admission.  Rx / DC Orders ED Discharge Orders     None         Airyana Sprunger, MD 08/11/23 [phone]

## 2023-08-12 ENCOUNTER — Other Ambulatory Visit: Payer: Self-pay | Admitting: Cardiology

## 2023-08-12 DIAGNOSIS — I4891 Unspecified atrial fibrillation: Secondary | ICD-10-CM

## 2023-08-12 DIAGNOSIS — R079 Chest pain, unspecified: Secondary | ICD-10-CM | POA: Diagnosis not present

## 2023-08-12 DIAGNOSIS — E785 Hyperlipidemia, unspecified: Secondary | ICD-10-CM | POA: Diagnosis not present

## 2023-08-12 DIAGNOSIS — I2510 Atherosclerotic heart disease of native coronary artery without angina pectoris: Secondary | ICD-10-CM | POA: Diagnosis not present

## 2023-08-12 LAB — LIPID PANEL
Cholesterol: 133 mg/dL (ref 0–200)
HDL: 48 mg/dL (ref >40)
LDL Cholesterol: 66 mg/dL (ref 0–99)
Total CHOL/HDL Ratio: 2.8 ratio
Triglycerides: 97 mg/dL (ref <150)
VLDL: 19 mg/dL (ref 0–40)

## 2023-08-12 LAB — BASIC METABOLIC PANEL WITH GFR
Anion gap: 5 (ref 5–15)
BUN: 6 mg/dL — ABNORMAL LOW (ref 8–23)
CO2: 23 mmol/L (ref 22–32)
Calcium: 8.6 mg/dL — ABNORMAL LOW (ref 8.9–10.3)
Chloride: 113 mmol/L — ABNORMAL HIGH (ref 98–111)
Creatinine, Ser: 0.79 mg/dL (ref 0.44–1.00)
GFR, Estimated: >60 mL/min (ref >60)
Glucose, Bld: 110 mg/dL — ABNORMAL HIGH (ref 70–99)
Potassium: 3.7 mmol/L (ref 3.5–5.1)
Sodium: 141 mmol/L (ref 135–145)

## 2023-08-12 LAB — MAGNESIUM: Magnesium: 2.3 mg/dL (ref 1.7–2.4)

## 2023-08-12 MED ORDER — DILTIAZEM HCL ER COATED BEADS 120 MG PO CP24
120.0000 mg | ORAL_CAPSULE | Freq: Every day | ORAL | Status: DC
  Administered 2023-08-12: 120 mg via ORAL
  Filled 2023-08-12: qty 1

## 2023-08-12 MED ORDER — EZETIMIBE 10 MG PO TABS
10.0000 mg | ORAL_TABLET | Freq: Every day | ORAL | Status: DC
  Administered 2023-08-12: 10 mg via ORAL
  Filled 2023-08-12: qty 1

## 2023-08-12 MED ORDER — DILTIAZEM HCL ER COATED BEADS 120 MG PO CP24: 120.0000 mg | ORAL_CAPSULE | Freq: Every day | ORAL | 0 refills | Status: DC

## 2023-08-12 MED ORDER — EZETIMIBE 10 MG PO TABS: 10.0000 mg | ORAL_TABLET | Freq: Every day | ORAL | 0 refills | Status: DC

## 2023-08-12 MED ORDER — SODIUM CHLORIDE 0.9% FLUSH: 3.0000 mL | Freq: Two times a day (BID) | INTRAVENOUS | Status: DC

## 2023-08-12 MED ORDER — SODIUM CHLORIDE 0.9% FLUSH: 3.0000 mL | INTRAVENOUS | Status: DC | PRN

## 2023-08-12 MED ORDER — APIXABAN 5 MG PO TABS: 5.0000 mg | ORAL_TABLET | Freq: Two times a day (BID) | ORAL | 2 refills | Status: DC

## 2023-08-12 MED ORDER — POTASSIUM CHLORIDE CRYS ER 20 MEQ PO TBCR
40.0000 meq | EXTENDED_RELEASE_TABLET | Freq: Once | ORAL | Status: AC
  Administered 2023-08-12: 40 meq via ORAL
  Filled 2023-08-12: qty 2

## 2023-08-12 MED ORDER — APIXABAN 5 MG PO TABS: 5.0000 mg | ORAL_TABLET | Freq: Two times a day (BID) | ORAL | 0 refills | Status: DC

## 2023-08-12 MED ORDER — EZETIMIBE 10 MG PO TABS: 10.0000 mg | ORAL_TABLET | Freq: Every day | ORAL | 2 refills | Status: DC

## 2023-08-12 MED ORDER — DILTIAZEM HCL ER COATED BEADS 120 MG PO CP24: 120.0000 mg | ORAL_CAPSULE | Freq: Every day | ORAL | 2 refills | Status: DC

## 2023-08-12 MED ORDER — POTASSIUM CHLORIDE CRYS ER 20 MEQ PO TBCR: 40.0000 meq | EXTENDED_RELEASE_TABLET | Freq: Once | ORAL | Status: DC

## 2023-08-12 NOTE — Discharge Instructions (Signed)

## 2023-08-12 NOTE — Plan of Care (Signed)

## 2023-08-12 NOTE — Discharge Summary (Signed)
 Physician Discharge Summary   Patient: Katrina Campbell MRN: [id number] DOB: January 15, 1956  Admit date:     08/10/2023  Discharge date: 08/12/23  Discharge Physician: MDALA-GAUSI, Jilda Most   PCP: Jenelle Mis, FNP   Recommendations at discharge:    Follow up with Cardiology.   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 *  Hospital Course: 68 year old with PMH of nonobstructive CAD, COPD, NSCLC, hypothyroidism who presented with chest discomfort and was found to have A-fib with RVR. Patient was seen by cardiology, who planned for TEE/DCCV on 08/14/2023. However patient converted to sinus rhythm on 5/10.   The rest of the hospital course is in problem-based format below:    Atrial fibrillation with RVR Patient presented with chest discomfort and was found to be in RVR. She was seen by cardiology. TTE revealed EF 55 to 60% with no regional wall motion abnormalities, normal-sized left atrium. Patient was started on heparin  drip and then transitioned to Eliquis. She was started on a diltiazem infusion and converted to sinus rhythm. On the morning of 5/10, cardiology switched patient to p.o. Cardizem. She remained in sinus rhythm and was discharged home with instructions to follow-up with cardiology. Cardiac monitor arranged at discharge. Prescriptions for Cardizem and Eliquis sent to local pharmacy as well as to mail order pharmacy.  Hypothyroidism Home Synthroid  was continued.  Hyperlipidemia Patient is on Crestor . As patient unable to tolerate a higher dose of statins,  Zetia  was added.  COPD Home Trelegy was continued.  GERD Continued home PPI.  Stage III NSCLC Outpatient follow-up.         Consultants: Cardiology  Procedures performed: n/a  Disposition: Home Diet recommendation:  Discharge Diet Orders (From admission, onward)     Start     Ordered   08/12/23 0000  Diet - low sodium heart healthy        08/12/23 1157           Cardiac diet DISCHARGE MEDICATION: Allergies as of 08/12/2023       Reactions   Breztri  Aerosphere [budeson-glycopyrrol-formoterol ] Shortness Of Breath   Codeine Other (See Comments)   Causes pt to blackout   Statins    Myalgias at higher doses        Medication List     STOP taking these medications    predniSONE  20 MG tablet Commonly known as: DELTASONE        TAKE these medications    acetaminophen  500 MG tablet Commonly known as: TYLENOL  Take 500 mg by mouth every 6 (six) hours as needed for moderate pain or headache.   albuterol  (2.5 MG/3ML) 0.083% nebulizer solution Commonly known as: PROVENTIL  INHALE 3 MLS (2.5 MG TOTAL) VIA NEBULIZER EVERY 6 HOURS AS NEEDED FOR WHEEZING OR SHORTNESS OF BREATH   albuterol  108 (90 Base) MCG/ACT inhaler Commonly known as: VENTOLIN  HFA Inhale 2 puffs into the lungs every 6 (six) hours as needed for wheezing or shortness of breath.   apixaban 5 MG Tabs tablet Commonly known as: ELIQUIS Take 1 tablet (5 mg total) by mouth 2 (two) times daily.   apixaban  5 MG Tabs tablet Commonly known as: ELIQUIS Take 1 tablet (5 mg total) by mouth 2 (two) times daily. Start taking on: September 12, 2023   aspirin  EC 81 MG tablet Take 81 mg by mouth daily with lunch.   cyanocobalamin 1000 MCG tablet Commonly known as: VITAMIN B12 Take 1,000 mcg by mouth daily.   Dialyvite Vitamin D  5000 125 MCG (5000 UT) capsule Generic drug: Cholecalciferol Take 5,000 Units by mouth daily.   diltiazem 120 MG 24 hr capsule Commonly known as: CARDIZEM CD Take 1 capsule  (120 mg total) by mouth daily. Start taking on: Aug 13, 2023   diltiazem 120 MG 24 hr capsule Commonly known as: Cardizem CD Take 1 capsule (120 mg total) by mouth daily. Start taking on: September 13, 2023   esomeprazole  40 MG capsule Commonly known as: NEXIUM  TAKE 1 CAPSULE DAILY   ezetimibe  10 MG tablet Commonly known as: ZETIA  Take 1 tablet (10 mg total) by mouth daily. Start taking on: Aug 13, 2023   ezetimibe  10 MG tablet Commonly known as: Zetia  Take 1 tablet (10 mg total) by mouth daily. Start taking on: September 12, 2023   ferrous sulfate 325 (65 FE) MG tablet Take 325 mg by mouth every other day.   Flutter Devi Use as directed   IMMUNE SUPPORT PO Take 1 capsule by mouth daily.   levothyroxine  88 MCG tablet Commonly known as: SYNTHROID  TAKE 1 TABLET(88 MCG) BY MOUTH DAILY   Mucinex  Maximum Strength 1200 MG Tb12 Generic drug: Guaifenesin  Take 1,200 mg by mouth daily.   rosuvastatin  20 MG tablet Commonly known as: CRESTOR  Take 1 tablet (20 mg total) by mouth daily. Must keep scheduled appointment for future refills. Thank you.   temazepam  15 MG capsule Commonly known as: RESTORIL  TAKE 1 CAPSULE AT BEDTIME AS NEEDED What changed: See the new instructions.   Trelegy Ellipta  100-62.5-25 MCG/ACT Aepb Generic drug: Fluticasone -Umeclidin-Vilant USE 1 INHALATION DAILY        Follow-up Information     Jenelle Mis, FNP.   Specialty: Family Medicine Contact information: [address] [place] [address] [phone]                Discharge Exam: Filed Weights   08/10/23 2351 08/11/23 0903 08/12/23 0515  Weight: 95.8 kg 62.5 kg 61.6 kg   Physical Exam on Day of Discharge   General: Alert, cheerful, oriented X3  Oral cavity: moist mucous membranes  Neck: supple  Chest: clear to auscultation. No crackles, no wheezes  CVS: S1,S2 RRR. No murmurs  Abd: No distention, soft, non-tender. No masses palpable  Extr: No edema    Condition at  discharge: good  The results of significant diagnostics from this hospitalization (including imaging, microbiology, ancillary and laboratory) are listed below for reference.   Imaging Studies: ECHOCARDIOGRAM COMPLETE Result Date: 08/11/2023    ECHOCARDIOGRAM REPORT   Patient Name:   Katrina Campbell Date of Exam: 08/11/2023 Medical Rec #:  [id number]   Height:       64.0 in Accession #:    [id number]  Weight:       137.8 lb Date of Birth:  08-21-1955    BSA:          1.670 m Patient Age:    67 years    BP:           102/85 mmHg Patient Gender: F           HR:  107 bpm. Exam Location:  Inpatient Procedure: 2D Echo, Color Doppler and Cardiac Doppler (Both Spectral and Color            Flow Doppler were utilized during procedure). Indications:    Atrial Fibrillation  History:        Patient has prior history of Echocardiogram examinations, most                 recent 12/10/2021. CAD; Arrythmias:Atrial Fibrillation.  Sonographer:    Janette Medley Referring Phys: [id number] ARSHAD N KAKRAKANDY IMPRESSIONS  1. Patient appears to be in rapid afib during study.  2. Left ventricular ejection fraction, by estimation, is 55 to 60%. The left ventricle has normal function. The left ventricle has no regional wall motion abnormalities. Left ventricular diastolic parameters are indeterminate.  3. Right ventricular systolic function is normal. The right ventricular size is normal.  4. A small pericardial effusion is present. The pericardial effusion is anterior to the right ventricle.  5. The mitral valve is abnormal. Trivial mitral valve regurgitation. No evidence of mitral stenosis.  6. The aortic valve is tricuspid. There is mild calcification of the aortic valve. There is mild thickening of the aortic valve. Aortic valve regurgitation is not visualized. Aortic valve sclerosis is present, with no evidence of aortic valve stenosis.  7. The inferior vena cava is normal in size with greater than 50% respiratory variability, suggesting  right atrial pressure of 3 mmHg. FINDINGS  Left Ventricle: Left ventricular ejection fraction, by estimation, is 55 to 60%. The left ventricle has normal function. The left ventricle has no regional wall motion abnormalities. Strain was performed and the global longitudinal strain is indeterminate. The left ventricular internal cavity size was normal in size. There is no left ventricular hypertrophy. Left ventricular diastolic parameters are indeterminate. Right Ventricle: The right ventricular size is normal. No increase in right ventricular wall thickness. Right ventricular systolic function is normal. Left Atrium: Left atrial size was normal in size. Right Atrium: Right atrial size was normal in size. Pericardium: A small pericardial effusion is present. The pericardial effusion is anterior to the right ventricle. Mitral Valve: The mitral valve is abnormal. There is mild thickening of the mitral valve leaflet(s). There is mild calcification of the mitral valve leaflet(s). Mild mitral annular calcification. Trivial mitral valve regurgitation. No evidence of mitral valve stenosis. Tricuspid Valve: The tricuspid valve is normal in structure. Tricuspid valve regurgitation is mild . No evidence of tricuspid stenosis. Aortic Valve: The aortic valve is tricuspid. There is mild calcification of the aortic valve. There is mild thickening of the aortic valve. Aortic valve regurgitation is not visualized. Aortic valve sclerosis is present, with no evidence of aortic valve stenosis. Pulmonic Valve: The pulmonic valve was normal in structure. Pulmonic valve regurgitation is trivial. No evidence of pulmonic stenosis. Aorta: The aortic root is normal in size and structure. Venous: The inferior vena cava is normal in size with greater than 50% respiratory variability, suggesting right atrial pressure of 3 mmHg. IAS/Shunts: No atrial level shunt detected by color flow Doppler. Additional Comments: Patient appears to be in rapid  afib during study. 3D was performed not requiring image post processing on an independent workstation and was indeterminate.  LEFT VENTRICLE PLAX 2D LVIDd:         4.10 cm LVIDs:         3.00 cm LV PW:         1.00 cm LV IVS:  1.00 cm LVOT diam:     2.10 cm LV SV:         58 LV SV Index:   35 LVOT Area:     3.46 cm  RIGHT VENTRICLE             IVC RV S prime:     16.00 cm/s  IVC diam: 1.90 cm TAPSE (M-mode): 2.0 cm LEFT ATRIUM             Index        RIGHT ATRIUM           Index LA Vol (A2C):   43.5 ml 26.05 ml/m  RA Area:     16.60 cm LA Vol (A4C):   20.3 ml 12.16 ml/m  RA Volume:   45.00 ml  26.95 ml/m LA Biplane Vol: 32.5 ml 19.46 ml/m  AORTIC VALVE LVOT Vmax:   103.00 cm/s LVOT Vmean:  60.300 cm/s LVOT VTI:    0.167 m  AORTA Ao Root diam: 3.10 cm Ao Asc diam:  3.50 cm  SHUNTS Systemic VTI:  0.17 m Systemic Diam: 2.10 cm Janelle Mediate MD Electronically signed by Janelle Mediate MD Signature Date/Time: 08/11/2023/1:41:22 PM    Final    DG Chest Port 1 View Result Date: 08/11/2023 CLINICAL DATA:  Fever. EXAM: PORTABLE CHEST 1 VIEW COMPARISON:  08/10/2023 FINDINGS: The lungs are clear without focal pneumonia, edema, pneumothorax or pleural effusion. Hyperexpansion suggests emphysema. Cardiopericardial silhouette is at upper limits of normal for size. No acute bony abnormality. Telemetry leads overlie the chest. IMPRESSION: Hyperexpansion without acute cardiopulmonary findings. Tiny lung nodules seen on chest CTA earlier today are not evident by x-ray. Electronically Signed   By: Donnal Fusi M.D.   On: 08/11/2023 06:56   CT Angio Chest PE W and/or Wo Contrast Result Date: 08/11/2023 CLINICAL DATA:  Syncope/presyncope, cerebrovascular cause suspected. Chest pain. History of lung cancer. EXAM: CT ANGIOGRAPHY CHEST WITH CONTRAST TECHNIQUE: Multidetector CT imaging of the chest was performed using the standard protocol during bolus administration of intravenous contrast. Multiplanar CT image reconstructions  and MIPs were obtained to evaluate the vascular anatomy. RADIATION DOSE REDUCTION: This exam was performed according to the departmental dose-optimization program which includes automated exposure control, adjustment of the mA and/or kV according to patient size and/or use of iterative reconstruction technique. CONTRAST:  75mL OMNIPAQUE  IOHEXOL  350 MG/ML SOLN COMPARISON:  03/17/2023 FINDINGS: Cardiovascular: No filling defects in the pulmonary arteries to suggest pulmonary emboli. Heart is normal size. Aorta is normal caliber. Scattered coronary artery and aortic atherosclerosis. Mediastinum/Nodes: No mediastinal, hilar, or axillary adenopathy. Trachea and esophagus are unremarkable. Thyroid  unremarkable. Lungs/Pleura: Emphysema. Right upper lobe collapse again noted, unchanged. Stable linear scarring in the right middle lobe and lingula. No acute confluent airspace opacities or effusions. 3 mm left lower lobe pulmonary nodule on image 127 of series 6. This is new since prior study. 4 mm left upper lobe pulmonary nodule on image 35, also new. Upper Abdomen: No acute findings Musculoskeletal: Chest wall soft tissues are unremarkable. No acute bony abnormality. Review of the MIP images confirms the above findings. IMPRESSION: No evidence of pulmonary embolus. Scattered coronary artery disease. New small left upper lobe and left lower lobe pulmonary nodules measuring up to 4 mm. Given the patient's history of cancer, recommend follow-up CT in 6 months to assess stability. Stable right upper lobe collapse. Aortic Atherosclerosis (ICD10-I70.0) and Emphysema (ICD10-J43.9). Electronically Signed   By: Janeece Mechanic M.D.   On: 08/11/2023  00:54   DG Chest 2 View Result Date: 08/10/2023 CLINICAL DATA:  Chest pain EXAM: CHEST - 2 VIEW COMPARISON:  03/08/2023 FINDINGS: Mild hyperinflation. Heart and mediastinal contours are within normal limits. No focal opacities or effusions. No acute bony abnormality. IMPRESSION:  Hyperinflation.  No active cardiopulmonary disease. Electronically Signed   By: Janeece Mechanic M.D.   On: 08/10/2023 23:29   DG Cervical Spine Complete Result Date: 08/09/2023 CLINICAL DATA:  cervical radiculopathy EXAM: CERVICAL SPINE - COMPLETE 4+ VIEW COMPARISON:  March 17, 2023. FINDINGS: The cervical spine is visualized from C1-C7. Straightening of the cervical lordosis. No significant spondylolisthesis. Vertebral body heights are maintained: no evidence of acute fracture. Moderate intervertebral disc space height loss of C6-7. Mild intervertebral disc space height loss at C4-5 and C5-6. Multilevel uncovertebral hypertrophy most pronounced at C6-7. Uncovertebral hypertrophy and facet arthropathy results in moderate to severe LEFT neuroforaminal narrowing at C6-7, moderate neuroforaminal narrowing of LEFT C5-6, LEFT C4-5, RIGHT C6-7 and C5-6. No prevertebral soft tissue swelling. RIGHT upper lobe paramediastinal opacity consistent with known RIGHT upper lobe collapse. IMPRESSION: 1. Multilevel degenerative changes of the cervical spine most pronounced at C6-7 where there is moderate to severe LEFT neuroforaminal narrowing. 2. RIGHT upper lobe paramediastinal opacity consistent with known RIGHT upper lobe collapse with known history of treated pulmonary malignancy. Electronically Signed   By: Clancy Crimes M.D.   On: 08/09/2023 08:05    Microbiology: Results for orders placed or performed in visit on 08/18/21   MYCOBACTERIA, CULTURE, WITH FLUOROCHROME SMEAR     Status: None   Collection Time: 08/18/21 11:48 AM   Specimen: Sputum  Result Value Ref Range Status   MICRO NUMBER: [id number]  Final   SPECIMEN QUALITY: Adequate  Final   Source: SPUTUM  Final   STATUS: FINAL  Final   SMEAR:   Final    No acid-fast bacilli seen using the fluorochrome method.   RESULT:   Final    No Mycobacterium species isolated after 6 weeks incubation.    Labs: CBC: Recent Labs  Lab 08/10/23 2253  WBC 5.8   HGB 14.0  HCT 41.4  MCV 88.5  PLT 120*   Basic Metabolic Panel: Recent Labs  Lab 08/10/23 2253 08/11/23 0542 08/12/23 0302  NA 145 144 141  K 3.2* 4.0 3.7  CL 115* 113* 113*  CO2 22 23 23   GLUCOSE 116* 101* 110*  BUN 10 7* 6*  CREATININE 0.73 0.73 0.79  CALCIUM  9.0 8.6* 8.6*  MG  --  1.9 2.3   Liver Function Tests: Recent Labs  Lab 08/11/23 0542  AST 22  ALT 28  ALKPHOS 65  BILITOT 0.3  PROT 5.5*  ALBUMIN 3.1*   CBG: No results for input(s): "GLUCAP" in the last 168 hours.  Discharge time spent: greater than 30 minutes.  Signed: MDALA-GAUSI, Maysen Sudol AGATHA, MD [facility] 08/12/2023

## 2023-08-12 NOTE — Care Management (Signed)
Provided with Eliquis coupon

## 2023-08-12 NOTE — Progress Notes (Signed)
 Progress Note  Patient Name: Katrina Campbell Date of Encounter: 08/12/2023 Primary Cardiologist: Olinda Bertrand, DO   Subjective   Overnight rates have improved. Patient notes CP has improved.. IM found DOAC was cost effective and transitioned to this 08/11/23 Patient has converted sinus rhythm  Vital Signs    Vitals:   08/12/23 0000 08/12/23 0041 08/12/23 0515 08/12/23 0605  BP: 104/64 104/70 106/70 115/70  Pulse:  (!) 119 78   Resp:  20 20   Temp:  97.9 F (36.6 C) 98.5 F (36.9 C)   TempSrc:  Oral Oral   SpO2:  95% 96%   Weight:   61.6 kg   Height:        Intake/Output Summary (Last 24 hours) at 08/12/2023 0800 Last data filed at 08/12/2023 0602 Gross per 24 hour  Intake 1502.43 ml  Output 950 ml  Net 552.43 ml   Filed Weights   08/10/23 2351 08/11/23 0903 08/12/23 0515  Weight: 95.8 kg 62.5 kg 61.6 kg    Physical Exam   GEN: No acute distress.   Neck: No JVD Cardiac: RRR, no murmurs, rubs, or gallops.  Respiratory: Clear to auscultation bilaterally. GI: Soft, nontender, non-distended  MS: No edema  Labs   Telemetry: A fib-> SR   Chemistry Recent Labs  Lab 08/10/23 2253 08/11/23 0542 08/12/23 0302  NA 145 144 141  K 3.2* 4.0 3.7  CL 115* 113* 113*  CO2 22 23 23   GLUCOSE 116* 101* 110*  BUN 10 7* 6*  CREATININE 0.73 0.73 0.79  CALCIUM  9.0 8.6* 8.6*  PROT  --  5.5*  --   ALBUMIN  --  3.1*  --   AST  --  22  --   ALT  --  28  --   ALKPHOS  --  65  --   BILITOT  --  0.3  --   GFRNONAA >60 >60 >60  ANIONGAP 8 8 5      Hematology Recent Labs  Lab 08/10/23 2253  WBC 5.8  RBC 4.68  HGB 14.0  HCT 41.4  MCV 88.5  MCH 29.9  MCHC 33.8  RDW 13.9  PLT 120*     Cardiac Studies   Cardiac Studies & Procedures   ______________________________________________________________________________________________ CARDIAC CATHETERIZATION  CARDIAC CATHETERIZATION 11/07/2017  Conclusion Conclusions: 1. Eccentric calcification with mild stenosis of  the ostial LAD (~20%).  Otherwise, no angiographically significant coronary artery disease. 2. Normal left ventricular systolic function and filling pressure.  Recommendations: 1. Continue medical therapy and risk factor modification to prevent progression of CAD. 2. Monitor overnight to ensure the patient does not have recurrent chest pain.  Anticipate discharge home tomorrow morning.  Recommend Aspirin  81mg  daily for mild CAD and thoracic aortic aneurysm.  Sammy Crisp, MD [facility] Pager: [phone]  Findings Coronary Findings Diagnostic  Dominance: Left  Left Main Vessel is large. Vessel is angiographically normal. Short LMCA.  Left Anterior Descending Vessel is large. Ost LAD lesion is 20% stenosed. The lesion is eccentric. The lesion is calcified.  First Diagonal Branch Vessel is small in size.  Second Diagonal Branch Vessel is small in size.  Left Circumflex Vessel is large. Vessel is angiographically normal.  First Obtuse Marginal Branch Vessel is small in size.  Second Obtuse Marginal Branch Vessel is small in size.  Third Obtuse Marginal Branch Vessel is moderate in size.  Left Posterior Descending Artery Vessel is small in size.  First Left Posterolateral Branch Vessel is small in size.  Second Left Posterolateral Branch Vessel is small in size.  Right Coronary Artery Vessel is small. Vessel is angiographically normal.  Intervention  No interventions have been documented.   STRESS TESTS  MYOCARDIAL PERFUSION IMAGING 06/28/2016  Narrative  Nuclear stress EF: 58%.  Blood pressure demonstrated a normal response to exercise.  There was no ST segment deviation noted during stress.  Defect 1: There is a small defect of moderate severity present in the apical anterior and apex location.  The study is normal.  This is a low risk study.  The left ventricular ejection fraction is normal (55-65%).  Normal stress nuclear study  with breast attenuation but no ischemia; EF 58 with normal wall motion.   ECHOCARDIOGRAM  ECHOCARDIOGRAM COMPLETE 08/11/2023  Narrative ECHOCARDIOGRAM REPORT    Patient Name:   Katrina Campbell Date of Exam: 08/11/2023 Medical Rec #:  [id number]   Height:       64.0 in Accession #:    [id number]  Weight:       137.8 lb Date of Birth:  September 10, 1955    BSA:          1.670 m Patient Age:    67 years    BP:           102/85 mmHg Patient Gender: F           HR:           107 bpm. Exam Location:  Inpatient  Procedure: 2D Echo, Color Doppler and Cardiac Doppler (Both Spectral and Color Flow Doppler were utilized during procedure).  Indications:    Atrial Fibrillation  History:        Patient has prior history of Echocardiogram examinations, most recent 12/10/2021. CAD; Arrythmias:Atrial Fibrillation.  Sonographer:    Janette Medley Referring Phys: 50 ARSHAD N KAKRAKANDY  IMPRESSIONS   1. Patient appears to be in rapid afib during study. 2. Left ventricular ejection fraction, by estimation, is 55 to 60%. The left ventricle has normal function. The left ventricle has no regional wall motion abnormalities. Left ventricular diastolic parameters are indeterminate. 3. Right ventricular systolic function is normal. The right ventricular size is normal. 4. A small pericardial effusion is present. The pericardial effusion is anterior to the right ventricle. 5. The mitral valve is abnormal. Trivial mitral valve regurgitation. No evidence of mitral stenosis. 6. The aortic valve is tricuspid. There is mild calcification of the aortic valve. There is mild thickening of the aortic valve. Aortic valve regurgitation is not visualized. Aortic valve sclerosis is present, with no evidence of aortic valve stenosis. 7. The inferior vena cava is normal in size with greater than 50% respiratory variability, suggesting right atrial pressure of 3 mmHg.  FINDINGS Left Ventricle: Left ventricular ejection fraction, by  estimation, is 55 to 60%. The left ventricle has normal function. The left ventricle has no regional wall motion abnormalities. Strain was performed and the global longitudinal strain is indeterminate. The left ventricular internal cavity size was normal in size. There is no left ventricular hypertrophy. Left ventricular diastolic parameters are indeterminate.  Right Ventricle: The right ventricular size is normal. No increase in right ventricular wall thickness. Right ventricular systolic function is normal.  Left Atrium: Left atrial size was normal in size.  Right Atrium: Right atrial size was normal in size.  Pericardium: A small pericardial effusion is present. The pericardial effusion is anterior to the right ventricle.  Mitral Valve: The mitral valve is abnormal. There is mild thickening of the mitral valve  leaflet(s). There is mild calcification of the mitral valve leaflet(s). Mild mitral annular calcification. Trivial mitral valve regurgitation. No evidence of mitral valve stenosis.  Tricuspid Valve: The tricuspid valve is normal in structure. Tricuspid valve regurgitation is mild . No evidence of tricuspid stenosis.  Aortic Valve: The aortic valve is tricuspid. There is mild calcification of the aortic valve. There is mild thickening of the aortic valve. Aortic valve regurgitation is not visualized. Aortic valve sclerosis is present, with no evidence of aortic valve stenosis.  Pulmonic Valve: The pulmonic valve was normal in structure. Pulmonic valve regurgitation is trivial. No evidence of pulmonic stenosis.  Aorta: The aortic root is normal in size and structure.  Venous: The inferior vena cava is normal in size with greater than 50% respiratory variability, suggesting right atrial pressure of 3 mmHg.  IAS/Shunts: No atrial level shunt detected by color flow Doppler.  Additional Comments: Patient appears to be in rapid afib during study. 3D was performed not requiring image post  processing on an independent workstation and was indeterminate.   LEFT VENTRICLE PLAX 2D LVIDd:         4.10 cm LVIDs:         3.00 cm LV PW:         1.00 cm LV IVS:        1.00 cm LVOT diam:     2.10 cm LV SV:         58 LV SV Index:   35 LVOT Area:     3.46 cm   RIGHT VENTRICLE             IVC RV S prime:     16.00 cm/s  IVC diam: 1.90 cm TAPSE (M-mode): 2.0 cm  LEFT ATRIUM             Index        RIGHT ATRIUM           Index LA Vol (A2C):   43.5 ml 26.05 ml/m  RA Area:     16.60 cm LA Vol (A4C):   20.3 ml 12.16 ml/m  RA Volume:   45.00 ml  26.95 ml/m LA Biplane Vol: 32.5 ml 19.46 ml/m AORTIC VALVE LVOT Vmax:   103.00 cm/s LVOT Vmean:  60.300 cm/s LVOT VTI:    0.167 m  AORTA Ao Root diam: 3.10 cm Ao Asc diam:  3.50 cm   SHUNTS Systemic VTI:  0.17 m Systemic Diam: 2.10 cm  Janelle Mediate MD Electronically signed by Janelle Mediate MD Signature Date/Time: 08/11/2023/1:41:22 PM    Final      CT SCANS  CT CORONARY MORPH W/CTA COR W/SCORE 03/03/2020  Addendum 03/03/2020  1:37 PM ADDENDUM REPORT: 03/03/2020 13:35  CLINICAL DATA:  68 year old female with h/o hypertension, hyperlipidemia, known non-obstructive CAD on cath in 2019 and dyspnea on exertion.  EXAM: Cardiac/Coronary  CTA  TECHNIQUE: The patient was scanned on a [employer].  FINDINGS: A 100 kV prospective scan was triggered in the descending thoracic aorta at 111 HU's. Axial non-contrast 3 mm slices were carried out through the heart. The data set was analyzed on a dedicated work station and scored using the Agatson method. Gantry rotation speed was 250 msecs and collimation was .6 mm. Metoprolol  100 mg PO and 0.8 mg of sl NTG was given. The 3D data set was reconstructed in 5% intervals of the 67-82 % of the R-R cycle. Diastolic phases were analyzed on a dedicated work station using MPR, MIP  and VRT modes. The patient received 80 cc of contrast.  Aorta: Normal size. Mild  diffuse atherosclerotic disease and calcifications. No dissection.  Aortic Valve:  Trileaflet.  No calcifications.  Coronary Arteries:  Normal coronary origin.  Left dominance.  RCA is a small non-dominant artery with luminal irregularities.  Left main is a large artery that gives rise to LAD and LCX arteries. Distal left main has mild to moderate eccentric calcified plaque with stenosis 25-49%.  LAD is a large vessel that gives rise to two diagonal arteries. There is mild calcified plaque in the proximal LAD with stenosis 25-49%. Mid and distal LAD has minimal plaque.  D1,2 have only minimal irregularities.  LCX is a large dominant artery that gives rise to PDA and PLA. There is mild calcified plaque in the proximal and mid LCX artery with stenosis 25-49%, otherwise only minimal non-calcified plaque.  Other findings:  Normal pulmonary vein drainage into the left atrium.  Normal left atrial appendage without a thrombus.  Normal size of the pulmonary artery.  IMPRESSION: 1. Coronary calcium  score of 261. This was 90 percentile for age and sex matched control.  2. Normal coronary origin with left dominance.  3. CAD-RADS 2. Mild non-obstructive CAD (25-49%) in the distal left main and proximal portions of LAD and LCX arteries. Consider non-atherosclerotic causes of dyspnea. Consider preventive therapy and risk factor modification.   Electronically Signed By: Christoper Crafts On: 03/03/2020 13:35  Narrative EXAM: OVER-READ INTERPRETATION  CT CHEST  The following report is an over-read performed by radiologist Dr. Alexandria Angel of [facility] on 03/03/2020. This over-read does not include interpretation of cardiac or coronary anatomy or pathology. The coronary calcium  score/coronary CTA interpretation by the cardiologist is attached.  COMPARISON:  None.  FINDINGS: Aortic atherosclerosis. Mild emphysema. Within the visualized portions of the  thorax there are no suspicious appearing pulmonary nodules or masses, there is no acute consolidative airspace disease, no pleural effusions, no pneumothorax and no lymphadenopathy. Visualized portions of the upper abdomen are unremarkable. There are no aggressive appearing lytic or blastic lesions noted in the visualized portions of the skeleton.  IMPRESSION: 1. Aortic Atherosclerosis (ICD10-I70.0) and Emphysema (ICD10-J43.9).  Electronically Signed: By: Alexandria Angel M.D. On: 03/03/2020 10:23     ______________________________________________________________________________________________           Assessment & Plan   Atrial fibrillation with RVR new -> transitioned to diltiazem 120 mg PO today stopping IV diltiazem - on DOAC (see above) - we have discussed her long term care: this may be related to her thyroid  levels; will start with conservative measures as she does not like to take medications: if further episodes will start amiodarone 400 mg PO BID despite her hypothyroidism; we would use this as a bridge to ablation - EKG ordered - K is being repleted - working to coordinate AF clinic follow up after heart monitor, then f/u with me or Dr. Albert Huff this summer/fall    Chest pain Nonobstructive CAD Hyperlipidemia with LDL goal < 70 Statin myalgia - CP related to AF - no plans for inpatient ischemia eval at this time - add zetia  to regimen, unable to tolerate crestor  at higher doses   AS (mild with normal stroke volume index and no significant gradient) Pericardial effusion - monitor  NSCLC with prior high dose radiation - LDL goal < 70 - discussed her risks related to HLD  If no further issues can be discharged from cardiac standpoint  For questions or updates,  please contact [facility] Please consult [email or web address] for contact info under Cardiology/STEMI.      Gloriann Larger, MD FASE [facility] Cardiologist [facility]  [address]  [place], [address] [place], [place] [address] [phone]  8:00 AM

## 2023-08-14 ENCOUNTER — Ambulatory Visit: Attending: Internal Medicine

## 2023-08-14 ENCOUNTER — Telehealth: Payer: Self-pay

## 2023-08-14 DIAGNOSIS — I4891 Unspecified atrial fibrillation: Secondary | ICD-10-CM

## 2023-08-14 SURGERY — TRANSESOPHAGEAL ECHOCARDIOGRAM (TEE) (CATHLAB)
Anesthesia: Monitor Anesthesia Care

## 2023-08-14 NOTE — Progress Notes (Unsigned)
Enrolled for [employer] to mail a ZIO XT long term holter monitor to the patients address on file.   Dr. Chandrasekhar to read. 

## 2023-08-14 NOTE — Transitions of Care (Post Inpatient/ED Visit) (Signed)
   08/14/2023  Name: Katrina Campbell MRN: [id number] DOB: 1955/04/13  Today's TOC FU Call Status: Today's TOC FU Call Status:: Successful TOC FU Call Completed TOC FU Call Complete Date: 08/14/23 Patient's Name and Date of Birth confirmed.  Transition Care Management Follow-up Telephone Call Date of Discharge: 08/12/23 Discharge Facility: [facility]) Type of Discharge: Inpatient Admission Primary Inpatient Discharge Diagnosis:: A Fib with RVR How have you been since you were released from the hospital?: Same Any questions or concerns?: No  Items Reviewed: Did you receive and understand the discharge instructions provided?: Yes Medications obtained,verified, and reconciled?: Yes (Medications Reviewed) Any new allergies since your discharge?: No Dietary orders reviewed?: Yes Type of Diet Ordered:: Heart Healthy low sodium Do you have support at home?: Yes People in Home [RPT]: spouse  Medications Reviewed Today: Medications Reviewed Today   Medications were not reviewed in this encounter     Home Care and Equipment/Supplies: Were Home Health Services Ordered?: NA Any new equipment or medical supplies ordered?: NA  Functional Questionnaire: Do you need assistance with bathing/showering or dressing?: No Do you need assistance with meal preparation?: No Do you need assistance with eating?: No Do you have difficulty maintaining continence: No Do you need assistance with getting out of bed/getting out of a chair/moving?: No Do you have difficulty managing or taking your medications?: No  Follow up appointments reviewed: PCP Follow-up appointment confirmed?: NA (patient declined) Specialist Hospital Follow-up appointment confirmed?: Yes Date of Specialist follow-up appointment?: 08/24/23 Follow-Up Specialty Provider:: Cardiology Do you need transportation to your follow-up appointment?: No Do you understand care options if your condition(s) worsen?: Yes-patient verbalized  understanding    SIGNATURE [place], LPN [facility] Advisor [facility] You Are. We Are. One [facility] Direct Dial [phone]

## 2023-08-15 ENCOUNTER — Encounter: Payer: Self-pay | Admitting: Internal Medicine

## 2023-08-15 ENCOUNTER — Other Ambulatory Visit: Payer: Self-pay

## 2023-08-15 MED ORDER — EZETIMIBE 10 MG PO TABS: 10.0000 mg | ORAL_TABLET | Freq: Every day | ORAL | 3 refills | Status: AC

## 2023-08-15 MED ORDER — APIXABAN 5 MG PO TABS: 5.0000 mg | ORAL_TABLET | Freq: Two times a day (BID) | ORAL | 1 refills | Status: DC

## 2023-08-15 MED ORDER — DILTIAZEM HCL ER COATED BEADS 120 MG PO CP24: 120.0000 mg | ORAL_CAPSULE | Freq: Every day | ORAL | 3 refills | Status: DC

## 2023-08-16 NOTE — Telephone Encounter (Signed)
 No longer a patient here

## 2023-08-17 ENCOUNTER — Other Ambulatory Visit: Payer: Self-pay

## 2023-08-17 ENCOUNTER — Encounter: Payer: Self-pay | Admitting: Family Medicine

## 2023-08-18 ENCOUNTER — Telehealth: Payer: Self-pay | Admitting: Internal Medicine

## 2023-08-18 NOTE — Telephone Encounter (Signed)
 Called pt to f/u BP.  Reports the following BP readings:  5/10- 138/96-91 5/12- 111/81-107 5/13- 101/77-95 5/14- 116/77-82 5/15- 103/76-83 5/16-  101/72-73  Advised pt if BP low and feels lightheaded to eat a small salty snack and increase fluid intake.  Advised MD not in office will address upon return to office.

## 2023-08-18 NOTE — Telephone Encounter (Signed)
 Pt states she had previously asked to switch providers from Dr. Albert Huff to Dr. Paulita Boss after seeing Dr. Paulita Boss in the hospital. Has a July f/u appt scheduled.   SOB since DC has worsened. BP checked for the last 6 days since being home and SBP has been low 100s- pt states she can stand quick and become lightheaded, goes away quickly though. Nauseous since DC, no energy beginning in the afternoons and feels like she could go to bed for the night at 12 PM. Explained to pt that I would forward this to both Dr. Paulita Boss (who is doing procedures at the hospital today) and his nurse to review and our office will be in touch with any recommendations/ further guidance.  Medications stated in hospital: Eliquis, Zetia  and Diltiazem

## 2023-08-18 NOTE — Telephone Encounter (Signed)
 New Message:   Patient said she was just discharged last Saturday She said she has 3 or 4 things going on. She would like for Dr Daril Edge nurse to please call her. She said she would give the nurse all the details on what is going on with her.

## 2023-08-21 ENCOUNTER — Encounter: Payer: Self-pay | Admitting: Internal Medicine

## 2023-08-21 ENCOUNTER — Other Ambulatory Visit: Payer: Self-pay | Admitting: Family Medicine

## 2023-08-21 MED ORDER — TEMAZEPAM 15 MG PO CAPS: 15.0000 mg | ORAL_CAPSULE | Freq: Every evening | ORAL | 0 refills | Status: DC | PRN

## 2023-08-21 NOTE — Telephone Encounter (Signed)
 Continuation of 08/21/23 note at 4:13 pm.    Pt reports lightheadedness/dizziness only last a few seconds when changing positions.  Feels extremely tired all the time.  C/O small amount of bright red blood noted in phlegm with cough.  Advised pt to continue to monitor bleeding if worsening to contact our office.  Also advised MD would like to have results of heart monitor to have a better understanding of how to treat symptoms.    Pt will stop Diltiazem , monitor BP and HR if HR sustains above 120 bpm will contact our office for assistance or call 911 if needed.  Advised pt AF can cause fatigue, lightheadedness and dizziness.   Has an OV with AF clinic on 08/24/23.  Will contact office if has further questions or concerns.

## 2023-08-21 NOTE — Addendum Note (Signed)
 Addended by: Christine Cozier on: 08/21/2023 05:31 PM   Modules accepted: Orders

## 2023-08-21 NOTE — Telephone Encounter (Signed)
 Called pt advised of MD response: Chart reviewed:  PAF with prior spontaneous conversion in hospital.  On diltiazem  having symptomatic hypotension.  - BP has improved since call in.   - two week nonlive ziopatch recommended, if going in and out of AF would pursue start of AAD therapy.  - if BP gets worse, we would hold diltiazem .  - long term may do well with ablation; EP eval post heart monitor   Thanks,  MAC   Pt reports is currently wearing heart monitor.  Started around 08/17/23.  Reports lightheadedness /dizziness

## 2023-08-21 NOTE — Telephone Encounter (Signed)
Please see telephone encounter for more details.

## 2023-08-21 NOTE — Telephone Encounter (Signed)
 Please review and advise, ed recommended

## 2023-08-24 ENCOUNTER — Ambulatory Visit (HOSPITAL_COMMUNITY)
Admission: RE | Admit: 2023-08-24 | Discharge: 2023-08-24 | Disposition: A | Discharge status: Home / Self Care | Source: Ambulatory Visit | Attending: Internal Medicine | Admitting: Internal Medicine

## 2023-08-24 ENCOUNTER — Encounter: Admitting: Family Medicine

## 2023-08-24 ENCOUNTER — Ambulatory Visit (HOSPITAL_COMMUNITY): Admitting: Internal Medicine

## 2023-08-24 VITALS — BP 142/88 | HR 62 | Ht 64.0 in | Wt 138.2 lb

## 2023-08-24 DIAGNOSIS — D6869 Other thrombophilia: Secondary | ICD-10-CM | POA: Insufficient documentation

## 2023-08-24 DIAGNOSIS — I480 Paroxysmal atrial fibrillation: Secondary | ICD-10-CM | POA: Insufficient documentation

## 2023-08-24 DIAGNOSIS — I4891 Unspecified atrial fibrillation: Secondary | ICD-10-CM | POA: Diagnosis not present

## 2023-08-24 NOTE — Progress Notes (Signed)
 Primary Care Physician: Jenelle Mis, FNP Primary Cardiologist: Jann Melody, MD Electrophysiologist: None     Referring Physician: Dr. Coralie Derrick is a 68 y.o. female with a history of HLD, HTN, hypothyroidism, stage III squamous cell carcinoma of right lung, GERD, COPD, aortic atherosclerosis, nonobstructive CAD, thoracic aortic aneurysm, and atrial fibrillation who presents for consultation in the [facility]. Hospital admission 5/8-10/25 for Afib with RVR; she converted to NSR. Dr. Tita Form noted if further episodes to consider amiodarone as bridge to ablation. Discharged on diltiazem  120 mg daily. Patient is on Eliquis  5 mg BID for a CHADS2VASC score of 4.  On evaluation today, she is currently in NSR. Diltiazem  held 5/19 due to patient calling office noting orthostatic hypotension. She still feels tired since hospital discharge. She has not felt any palpitations since discharge. She is wearing monitor. No missed doses of Eliquis .  Today, she denies symptoms of  chest pain, shortness of breath, orthopnea, PND, lower extremity edema, dizziness, presyncope, syncope, snoring, daytime somnolence, bleeding, or neurologic sequela. The patient is tolerating medications without difficulties and is otherwise without complaint today.    she has a BMI of Body mass index is 23.72 kg/m.Aaron Aas Filed Weights   08/24/23 0828  Weight: 62.7 kg    Current Outpatient Medications  Medication Sig Dispense Refill   acetaminophen  (TYLENOL ) 500 MG tablet Take 500 mg by mouth as needed for moderate pain (pain score 4-6) or headache.     albuterol  (PROVENTIL ) (2.5 MG/3ML) 0.083% nebulizer solution INHALE 3 MLS (2.5 MG TOTAL) VIA NEBULIZER EVERY 6 HOURS AS NEEDED FOR WHEEZING OR SHORTNESS OF BREATH 540 mL 7   albuterol  (VENTOLIN  HFA) 108 (90 Base) MCG/ACT inhaler Inhale 2 puffs into the lungs every 6 (six) hours as needed for wheezing or shortness of breath. 18  g 1   [START ON 09/12/2023] apixaban  (ELIQUIS ) 5 MG TABS tablet Take 1 tablet (5 mg total) by mouth 2 (two) times daily. 60 tablet 2   apixaban  (ELIQUIS ) 5 MG TABS tablet Take 1 tablet (5 mg total) by mouth 2 (two) times daily. 180 tablet 1   Cholecalciferol (DIALYVITE VITAMIN D  5000) 125 MCG (5000 UT) capsule Take 5,000 Units by mouth daily.     esomeprazole  (NEXIUM ) 40 MG capsule TAKE 1 CAPSULE DAILY 90 capsule 3   ezetimibe  (ZETIA ) 10 MG tablet Take 1 tablet (10 mg total) by mouth daily. 90 tablet 3   ferrous sulfate  325 (65 FE) MG tablet Take 325 mg by mouth every other day.     Guaifenesin  (MUCINEX  MAXIMUM STRENGTH) 1200 MG TB12 Take 1,200 mg by mouth daily.     levothyroxine  (SYNTHROID ) 88 MCG tablet TAKE 1 TABLET(88 MCG) BY MOUTH DAILY 30 tablet 2   Multiple Vitamins-Minerals (IMMUNE SUPPORT PO) Take 1 capsule by mouth daily.     Respiratory Therapy Supplies (FLUTTER) DEVI Use as directed 1 each 0   rosuvastatin  (CRESTOR ) 20 MG tablet Take 1 tablet (20 mg total) by mouth daily. Must keep scheduled appointment for future refills. Thank you. 90 tablet 3   temazepam  (RESTORIL ) 15 MG capsule Take 1 capsule (15 mg total) by mouth at bedtime as needed for sleep. TAKE 1 CAPSULE AT BEDTIME AS NEEDED 90 capsule 0   TRELEGY ELLIPTA  100-62.5-25 MCG/ACT AEPB USE 1 INHALATION DAILY 180 each 3   vitamin B-12 (CYANOCOBALAMIN ) 1000 MCG tablet Take 1,000 mcg by mouth daily.     aspirin  EC 81  MG tablet Take 81 mg by mouth daily with lunch.  (Patient not taking: Reported on 08/24/2023)     No current facility-administered medications for this encounter.   Facility-Administered Medications Ordered in Other Encounters  Medication Dose Route Frequency Provider Last Rate Last Admin   Sonafine emulsion 1 application  1 application  Topical Once Retta Caster, MD        Atrial Fibrillation Management history:  Previous antiarrhythmic drugs: none Previous cardioversions: none Previous ablations:  none Anticoagulation history: Eliquis    ROS- All systems are reviewed and negative except as per the HPI above.  Physical Exam: BP (!) 142/88   Pulse 62   Ht 5\' 4"  (1.626 m)   Wt 62.7 kg   BMI 23.72 kg/m   GEN: Well nourished, well developed in no acute distress NECK: No JVD; No carotid bruits CARDIAC: Regular rate and rhythm, no murmurs, rubs, gallops RESPIRATORY:  Clear to auscultation without rales, wheezing or rhonchi  ABDOMEN: Soft, non-tender, non-distended EXTREMITIES:  No edema; No deformity   EKG today demonstrates  Vent. rate 62 BPM PR interval 134 ms QRS duration 76 ms QT/QTcB 460/466 ms P-R-T axes 75 65 80 Normal sinus rhythm Possible Anterior infarct (cited on or before 15-Jun-2023) Abnormal ECG When compared with ECG of 10-Aug-2023 22:52, Sinus rhythm has replaced Atrial fibrillation   Echo 08/11/23 demonstrated   1. Patient appears to be in rapid afib during study.   2. Left ventricular ejection fraction, by estimation, is 55 to 60%. The  left ventricle has normal function. The left ventricle has no regional  wall motion abnormalities. Left ventricular diastolic parameters are  indeterminate.   3. Right ventricular systolic function is normal. The right ventricular  size is normal.   4. A small pericardial effusion is present. The pericardial effusion is  anterior to the right ventricle.   5. The mitral valve is abnormal. Trivial mitral valve regurgitation. No  evidence of mitral stenosis.   6. The aortic valve is tricuspid. There is mild calcification of the  aortic valve. There is mild thickening of the aortic valve. Aortic valve  regurgitation is not visualized. Aortic valve sclerosis is present, with  no evidence of aortic valve stenosis.   7. The inferior vena cava is normal in size with greater than 50%  respiratory variability, suggesting right atrial pressure of 3 mmHg.   ASSESSMENT & PLAN CHA2DS2-VASc Score = 4  The patient's score is based  upon: CHF History: 0 HTN History: 1 Diabetes History: 0 Stroke History: 0 Vascular Disease History: 1 Age Score: 1 Gender Score: 1       ASSESSMENT AND PLAN: Paroxysmal Atrial Fibrillation (ICD10:  I48.0) The patient's CHA2DS2-VASc score is 4, indicating a 4.8% annual risk of stroke.    She is currently in NSR. Education provided about Afib. Discussion about triggers for Afib and medication treatments and ablation going forward if indicated. After discussion, we will proceed with conservative observation at this time. Rhythm monitoring device recommended. Follow up in 1 month to discuss monitor results. Discussed sleep study to rule out sleep apnea but patient defers for now.   Secondary Hypercoagulable State (ICD10:  D68.69) The patient is at significant risk for stroke/thromboembolism based upon her CHA2DS2-VASc Score of 4.  Continue Apixaban  (Eliquis ).  Continue Eliquis  5 mg BID without interruption. Will draw CBC at next OV.   Follow up 1 month Afib clinic.    Minnie Amber, PA-C  Af[facility] [address]  [place], [place] [address] [phone]

## 2023-08-31 ENCOUNTER — Encounter: Payer: Self-pay | Admitting: Internal Medicine

## 2023-08-31 ENCOUNTER — Telehealth (HOSPITAL_COMMUNITY): Payer: Self-pay | Admitting: *Deleted

## 2023-08-31 ENCOUNTER — Ambulatory Visit (INDEPENDENT_AMBULATORY_CARE_PROVIDER_SITE_OTHER): Admitting: Internal Medicine

## 2023-08-31 ENCOUNTER — Ambulatory Visit: Payer: Self-pay | Admitting: Family Medicine

## 2023-08-31 VITALS — BP 140/76 | HR 84 | Temp 98.3°F | Ht 64.0 in | Wt 138.0 lb

## 2023-08-31 DIAGNOSIS — I480 Paroxysmal atrial fibrillation: Secondary | ICD-10-CM | POA: Diagnosis not present

## 2023-08-31 DIAGNOSIS — R110 Nausea: Secondary | ICD-10-CM | POA: Diagnosis not present

## 2023-08-31 DIAGNOSIS — Z87891 Personal history of nicotine dependence: Secondary | ICD-10-CM

## 2023-08-31 DIAGNOSIS — C3490 Malignant neoplasm of unspecified part of unspecified bronchus or lung: Secondary | ICD-10-CM | POA: Diagnosis not present

## 2023-08-31 DIAGNOSIS — R042 Hemoptysis: Secondary | ICD-10-CM | POA: Diagnosis not present

## 2023-08-31 MED ORDER — AZITHROMYCIN 250 MG PO TABS: ORAL_TABLET | ORAL | 0 refills | Status: DC

## 2023-08-31 NOTE — Telephone Encounter (Signed)
 Patient called in stating she was coughing up blood this morning. Denied any other symptoms and this has occurred before but not recently.  Denied any over the counter medications or recent illness. Pt is currently being treated for lung cancer. Instructed pt to reach out to her pulmonologist Dr Baldwin Levee for further assessment. Pt verbalized agreement and will call his office now.

## 2023-08-31 NOTE — Progress Notes (Signed)
 HPI- [name] 05/31/23- 68 year old woman with a history of former tobacco use, COPD, chronic right upper lobe atelectasis and Mycobacterium avium (treated x 1 yr), Bjerkandera adusta who has been followed in our office previously by Dr. [name] and then Dr. [name].  She was diagnosed with stage III A squamous cell lung cancer in 10/2020, treated with chemotherapy, XRT and then consolidation Imfinzi .  She is currently on observation.  She has been experiencing intermittent cough with hemoptysis her most recent CT chest showed no evidence of disease recurrence on 03/17/2023.  There is chronic stable right upper lobe atelectasis. Currently managed on Trelegy, uses albuterol  about 2x a month. She had a flare and required abx in December.  She has occasional cough. She does use her chest vest + nebs bid, flutter prn. Can get some mucous out with this, but not a lot. She is able to do the treadmill. Rarely uses any extra albuterol . Some days she has very low energy - she is having her thyroid  meds adjusted.   CT scan of the chest 03/17/2023  shows no evidence of disease recurrence.  Stable bronchiectasis without any evidence of groundglass or tree-in-bud nodularity  =====================================================================================  08/31/23- Acute visit- 67yoF former smoker with hx as above. Hemoptysis. Trelegy 100, albuterol  hfa, Neb albuterol ,  Recent hosp 5/10 new onset AFib> Eliquis , Diltiazem . Now reports tbsp BRB today, mild dyspnea, no fever. Dr Mohamed/ ONC has CT chest pending June 10. Cardiology managing AFib, told her to take both BASA and Eliquis . Reports 2 episodes today of BRB with cough, total 1 tbsp. This first heme since episode few years ago. Denies pain or other bleeding. CXR 08/11/23 1V IMPRESSION: Hyperexpansion without acute cardiopulmonary findings. Tiny lung nodules seen on chest CTA earlier today are not evident by x-ray. CT a chest 08/11/23- IMPRESSION: No  evidence of pulmonary embolus. Scattered coronary artery disease. New small left upper lobe and left lower lobe pulmonary nodules measuring up to 4 mm. Given the patient's history of cancer, recommend follow-up CT in 6 months to assess stability. Stable right upper lobe collapse. Aortic Atherosclerosis (ICD10-I70.0) and Emphysema (ICD10-J43.9). Discussed the use of AI scribe software for clinical note transcription with the patient, who gave verbal consent to proceed.  History of Present Illness   Katrina Campbell is a 68 year old female with atrial fibrillation and lung cancer who presents with hemoptysis.  She experiences episodes of hemoptysis with bright red blood, including two occurrences this morning. A previous episode occurred before her last visit, marking the first since her recent hospital discharge. She denies fever, infection symptoms, or other abnormal bleeding or bruising.  She was recently hospitalized and started on Eliquis . Her heart returned to sinus rhythm before discharge. She has not experienced atrial fibrillation symptoms since discharge but notes decreased energy and oxygen  levels, which she monitors with a smartwatch.  She uses Trelegy and has not taken prednisone  recently. She was advised to stop baby aspirin  upon starting Eliquis  but resumed it later. Occasional nausea resolves with eating a saltine cracker.   Assessment and Plan:    Hemoptysis Intermittent hemoptysis with recent episodes. Possible causes include broken blood vessel/ bronchitis. No significant active bleeding. Eliquis  continued due to atrial fibrillation risk. - Initiate antibiotic therapy for potential acute bronchitis. - Continue Eliquis  as prescribed. - Hold baby aspirin  through the weekend. - Contact cardiology about anticoagulation if hemoptysis persists.  Lung cancer Monitoring T90 spots, likely scars. No evidence of cancer-related hemoptysis. -follow-up as planned  Paroxysmal  atrial  fibrillation Currently in sinus rhythm. On Eliquis  for anticoagulation. Concern for paroxysmal atrial fibrillation causing episodes. - Consider discussing with cardiology if hemoptysis persists.  Nausea Intermittent nausea relieved by simple foods. No vomiting. Possibly related to diet or medications. - Continue current dietary management.    History of Present Illness  ROS-see HPI   + = positive Constitutional:    weight loss, night sweats, fevers, chills, fatigue, lassitude. HEENT:    headaches, difficulty swallowing, tooth/dental problems, sore throat,       sneezing, itching, ear ache, nasal congestion, post nasal drip, snoring CV:    chest pain, orthopnea, PND, swelling in lower extremities, anasarca,                                   dizziness, palpitations Resp:   shortness of breath with exertion or at rest.                productive cough,   non-productive cough, +coughing up of blood.              change in color of mucus.  wheezing.   Skin:    rash or lesions. GI:  No-   heartburn, indigestion, abdominal pain, nausea, vomiting, diarrhea,                 change in bowel habits, loss of appetite GU: dysuria, change in color of urine, no urgency or frequency.   flank pain. MS:   joint pain, stiffness, decreased range of motion, back pain. Neuro-     nothing unusual Psych:  change in mood or affect.  depression or anxiety.   memory loss.  OBJ- Physical Exam General- Alert, Oriented, Affect-appropriate, Distress- none acute Skin- rash-none, lesions- none, excoriation- none Lymphadenopathy- none Head- atraumatic            Eyes- Gross vision intact, PERRLA, conjunctivae and secretions clear            Ears- Hearing, canals-normal            Nose- Clear, no-Septal dev, mucus, polyps, erosion, perforation             Throat- Mallampati II , mucosa clear , drainage- none, tonsils- atrophic Neck- flexible , trachea midline, no stridor , thyroid  nl, carotid no bruit Chest -  symmetrical excursion , unlabored           Heart/CV- RRR on my exam , no murmur , no gallop  , no rub, nl s1 s2                           - JVD- none , edema- none, stasis changes- none, varices- none           Lung-  wheeze- none, cough+wet/raspy, otherwise quiet chest , dullness-none, rub- none           Chest wall-  Abd-  Br/ Gen/ Rectal- Not done, not indicated Extrem- cyanosis- none, clubbing, none, atrophy- none, strength- nl Neuro- grossly intact to observation

## 2023-08-31 NOTE — Telephone Encounter (Signed)
 Intermittent coughing up bright red blood over one week (x2 today=1 tbsp)  Symptoms: Congestion, "a little SOB" with movement Frequency: Intermittent Pertinent Negatives: Patient denies fever, chest pain  Disposition:  [x] Appointment (In office)  Additional Notes: Patient was in hospital on 5/17 for a fib and was started on Eliquis  5 mg bid. Pt states when she came back from the hospital two days after she was coughing up a little bit of blood. Pt states she called the "a fib nurse" and was told to contact her pulmonologist. O2 saturation has been at 96 %. Patient scheduled for an appointment today. This RN educated pt on new-worsening symptoms and when to call back/seek emergent care. Pt verbalized understanding and agrees to plan.    Copied from CRM [id number]. Topic: Clinical - Red Word Triage >> Aug 31, 2023  9:15 AM Tyronne Galloway wrote: Red Word that prompted transfer to Nurse Triage: Pt started AFIB on 5/17 and was in the hospital for about 2 days dc on 5/19 and put on medication eliquis . Pt stated she coughed up bright red blood this morning and is concerned about it. Pt is looking for suggestions from Dr. Baldwin Levee and/or a nurse. Reason for Disposition  [1] Coughed up blood AND [2] > 1 tablespoon (15 ml)  Answer Assessment - Initial Assessment Questions ONSET: "When did the cough begin?"      Patient has lung cancer and keeps a constant cough SPUTUM: "Describe the color of your sputum" (none, dry cough; clear, white, yellow, green)     Bright red HEMOPTYSIS: "How much blood?" (flecks, streaks, tablespoons, etc.)     Two times together this morning to equal 1 tbsp DIFFICULTY BREATHING: "Are you having difficulty breathing?" If Yes, ask: "How bad is it?" (e.g., mild, moderate, severe)    - MILD: No SOB at rest, mild SOB with walking, speaks normally in sentences, can lie down, no retractions, pulse < 100.    - MODERATE: SOB at rest, SOB with minimal exertion and prefers to sit, cannot lie down  flat, speaks in phrases, mild retractions, audible wheezing, pulse 100-120.    - SEVERE: Very SOB at rest, speaks in single words, struggling to breathe, sitting hunched forward, retractions, pulse > 120      "A little SOB" when moving , mild FEVER: "Do you have a fever?" If Yes, ask: "What is your temperature, how was it measured, and when did it start?"     No  Protocols used: Coughing Up Blood-A-AH

## 2023-08-31 NOTE — Telephone Encounter (Signed)
 FYI pt scheduled acute ov

## 2023-08-31 NOTE — Patient Instructions (Addendum)
 Script sent for Zpak antibiotic- 2 today then one daily  Hold the baby aspirin  for now, but continue Eliquis   You may note a very little on and off blood in your sputum. If bleeding gets worse, stop the Eliquis  and let Pulmonary and Cardiology/ AFib clinic know for further advice.  I do not expect it, but especially this weekend, if bleeding gets a lot worse, go to ER.  Continue your breathing meds  Keep appointment with Dr Byrum

## 2023-09-12 ENCOUNTER — Inpatient Hospital Stay: Payer: Medicare Other | Attending: Internal Medicine

## 2023-09-12 ENCOUNTER — Ambulatory Visit (HOSPITAL_COMMUNITY)
Admission: RE | Admit: 2023-09-12 | Discharge: 2023-09-12 | Disposition: A | Discharge status: Home / Self Care | Payer: Medicare Other | Source: Ambulatory Visit | Attending: Internal Medicine | Admitting: Internal Medicine

## 2023-09-12 ENCOUNTER — Encounter (HOSPITAL_COMMUNITY): Payer: Self-pay

## 2023-09-12 DIAGNOSIS — D649 Anemia, unspecified: Secondary | ICD-10-CM | POA: Diagnosis not present

## 2023-09-12 DIAGNOSIS — Z7901 Long term (current) use of anticoagulants: Secondary | ICD-10-CM | POA: Insufficient documentation

## 2023-09-12 DIAGNOSIS — Z85118 Personal history of other malignant neoplasm of bronchus and lung: Secondary | ICD-10-CM | POA: Insufficient documentation

## 2023-09-12 DIAGNOSIS — I4891 Unspecified atrial fibrillation: Secondary | ICD-10-CM | POA: Diagnosis not present

## 2023-09-12 DIAGNOSIS — C3490 Malignant neoplasm of unspecified part of unspecified bronchus or lung: Secondary | ICD-10-CM | POA: Insufficient documentation

## 2023-09-12 DIAGNOSIS — E039 Hypothyroidism, unspecified: Secondary | ICD-10-CM | POA: Insufficient documentation

## 2023-09-12 DIAGNOSIS — J189 Pneumonia, unspecified organism: Secondary | ICD-10-CM | POA: Insufficient documentation

## 2023-09-12 DIAGNOSIS — D696 Thrombocytopenia, unspecified: Secondary | ICD-10-CM | POA: Diagnosis not present

## 2023-09-12 DIAGNOSIS — M5030 Other cervical disc degeneration, unspecified cervical region: Secondary | ICD-10-CM

## 2023-09-12 DIAGNOSIS — M5412 Radiculopathy, cervical region: Secondary | ICD-10-CM

## 2023-09-12 LAB — CBC WITH DIFFERENTIAL (CANCER CENTER ONLY)
Abs Immature Granulocytes: 0.01 10*3/uL (ref 0.00–0.07)
Basophils Absolute: 0 10*3/uL (ref 0.0–0.1)
Basophils Relative: 0 %
Eosinophils Absolute: 0.1 10*3/uL (ref 0.0–0.5)
Eosinophils Relative: 1 %
HCT: 37.4 % (ref 36.0–46.0)
Hemoglobin: 13 g/dL (ref 12.0–15.0)
Immature Granulocytes: 0 %
Lymphocytes Relative: 25 %
Lymphs Abs: 1.1 10*3/uL (ref 0.7–4.0)
MCH: 29.7 pg (ref 26.0–34.0)
MCHC: 34.8 g/dL (ref 30.0–36.0)
MCV: 85.4 fL (ref 80.0–100.0)
Monocytes Absolute: 0.3 10*3/uL (ref 0.1–1.0)
Monocytes Relative: 6 %
Neutro Abs: 3.1 10*3/uL (ref 1.7–7.7)
Neutrophils Relative %: 68 %
Platelet Count: 111 10*3/uL — ABNORMAL LOW (ref 150–400)
RBC: 4.38 MIL/uL (ref 3.87–5.11)
RDW: 13.2 % (ref 11.5–15.5)
WBC Count: 4.6 10*3/uL (ref 4.0–10.5)
nRBC: 0 % (ref 0.0–0.2)

## 2023-09-12 LAB — CMP (CANCER CENTER ONLY)
ALT: 25 U/L (ref 0–44)
AST: 25 U/L (ref 15–41)
Albumin: 4.1 g/dL (ref 3.5–5.0)
Alkaline Phosphatase: 84 U/L (ref 38–126)
Anion gap: 4 — ABNORMAL LOW (ref 5–15)
BUN: 11 mg/dL (ref 8–23)
CO2: 28 mmol/L (ref 22–32)
Calcium: 8.8 mg/dL — ABNORMAL LOW (ref 8.9–10.3)
Chloride: 107 mmol/L (ref 98–111)
Creatinine: 0.73 mg/dL (ref 0.44–1.00)
GFR, Estimated: >60 mL/min (ref >60)
Glucose, Bld: 76 mg/dL (ref 70–99)
Potassium: 3.8 mmol/L (ref 3.5–5.1)
Sodium: 139 mmol/L (ref 135–145)
Total Bilirubin: 0.4 mg/dL (ref 0.0–1.2)
Total Protein: 6.4 g/dL — ABNORMAL LOW (ref 6.5–8.1)

## 2023-09-12 MED ORDER — IOHEXOL 300 MG/ML  SOLN
75.0000 mL | Freq: Once | INTRAMUSCULAR | Status: AC | PRN
  Administered 2023-09-12: 75 mL via INTRAVENOUS

## 2023-09-12 MED ORDER — SODIUM CHLORIDE (PF) 0.9 % IJ SOLN
INTRAMUSCULAR | Status: AC
  Filled 2023-09-12: qty 50

## 2023-09-15 DIAGNOSIS — I4891 Unspecified atrial fibrillation: Secondary | ICD-10-CM | POA: Diagnosis not present

## 2023-09-18 ENCOUNTER — Ambulatory Visit (HOSPITAL_COMMUNITY): Payer: Self-pay | Admitting: Internal Medicine

## 2023-09-18 ENCOUNTER — Ambulatory Visit: Payer: Self-pay | Admitting: Emergency Medicine

## 2023-09-18 ENCOUNTER — Encounter: Payer: Self-pay | Admitting: Internal Medicine

## 2023-09-18 ENCOUNTER — Other Ambulatory Visit: Payer: Self-pay | Admitting: Internal Medicine

## 2023-09-18 MED ORDER — AZITHROMYCIN 250 MG PO TABS: ORAL_TABLET | ORAL | 0 refills | Status: DC

## 2023-09-18 NOTE — Telephone Encounter (Signed)
 Nfn

## 2023-09-18 NOTE — Telephone Encounter (Signed)
 Copied from CRM [id number]. Topic: Clinical - Red Word Triage >> Sep 18, 2023  4:05 PM Tyronne Galloway wrote: Red Word that prompted transfer to Nurse Triage: Pt would like Dr. Baldwin Levee to view her recent CT scan for mild infection/pneumonia completed on 6/10. Pt mentioned she was placed on  apixaban  (ELIQUIS ) 5 MG TABS tablet, and feels like either she is having side effects from the medication or she's dealing with these symptoms from what was found in her CT. Pt is having SOB, tired with no energy, and has weakness. Appt scheduled for 11/09/2023.   FYI Only or Action Required?: Action required by provider  Patient is followed in Pulmonology for CODP, last seen on 08/31/2023 by Faustina Hood, MD. Called Nurse Triage reporting Shortness of Breath. Symptoms began about a month ago. Symptoms are: gradually worsening.  Triage Disposition: See HCP Within 4 Hours (Or PCP Triage)  Patient/caregiver understands and will follow disposition?: No, wishes to speak with PCP   Reason for Disposition  [1] Longstanding difficulty breathing (e.g., CHF, COPD, emphysema) AND [2] WORSE than normal    Symptoms present for 1 month, patient seen by pulmonology since, symptoms not improving  Answer Assessment - Initial Assessment Questions 1. RESPIRATORY STATUS: Describe your breathing? (e.g., wheezing, shortness of breath, unable to speak, severe coughing)      Shortness of breath  2. ONSET: When did this breathing problem begin?      5/11 3. PATTERN Does the difficult breathing come and go, or has it been constant since it started?      Constant  4. SEVERITY: How bad is your breathing? (e.g., mild, moderate, severe)    - MILD: No SOB at rest, mild SOB with walking, speaks normally in sentences, can lie down, no retractions, pulse < 100.    - MODERATE: SOB at rest, SOB with minimal exertion and prefers to sit, cannot lie down flat, speaks in phrases, mild retractions, audible wheezing, pulse 100-120.    - SEVERE:  Very SOB at rest, speaks in single words, struggling to breathe, sitting hunched forward, retractions, pulse > 120      Moderate  5. RECURRENT SYMPTOM: Have you had difficulty breathing before? If Yes, ask: When was the last time? and What happened that time?      Yes, sees pulmonology  6. CARDIAC HISTORY: Do you have any history of heart disease? (e.g., heart attack, angina, bypass surgery, angioplasty)      Yes 7. LUNG HISTORY: Do you have any history of lung disease?  (e.g., pulmonary embolus, asthma, emphysema)     Yes 8. CAUSE: What do you think is causing the breathing problem?      Believes she may have pneumonia  9. OTHER SYMPTOMS: Do you have any other symptoms? (e.g., dizziness, runny nose, cough, chest pain, fever)     Fatigue 10. O2 SATURATION MONITOR:  Do you use an oxygen  saturation monitor (pulse oximeter) at home? If Yes, ask: What is your reading (oxygen  level) today? What is your usual oxygen  saturation reading? (e.g., 95%)       96%  Patient would like Dr. Baldwin Levee to review her CT scan from 6/10 due to concern for pneumonia. Please advise.  Protocols used: Breathing Difficulty-A-AH

## 2023-09-19 ENCOUNTER — Telehealth: Payer: Self-pay | Admitting: Medical Oncology

## 2023-09-19 ENCOUNTER — Other Ambulatory Visit: Payer: Self-pay | Admitting: Medical Oncology

## 2023-09-19 DIAGNOSIS — C3490 Malignant neoplasm of unspecified part of unspecified bronchus or lung: Secondary | ICD-10-CM

## 2023-09-19 MED ORDER — AZITHROMYCIN 250 MG PO TABS
ORAL_TABLET | ORAL | 0 refills | Status: DC
Start: 2023-09-19 — End: 2023-09-26

## 2023-09-19 NOTE — Telephone Encounter (Signed)
 Pt informed that Dr Marguerita Shih called in a zpak prescription to start.

## 2023-09-23 ENCOUNTER — Encounter: Payer: Self-pay | Admitting: Internal Medicine

## 2023-09-25 ENCOUNTER — Inpatient Hospital Stay (HOSPITAL_BASED_OUTPATIENT_CLINIC_OR_DEPARTMENT_OTHER): Payer: Medicare Other | Admitting: Internal Medicine

## 2023-09-25 ENCOUNTER — Ambulatory Visit (HOSPITAL_COMMUNITY): Admitting: Internal Medicine

## 2023-09-25 VITALS — BP 137/67 | HR 66 | Temp 98.0°F | Resp 18 | Ht 64.0 in | Wt 136.7 lb

## 2023-09-25 DIAGNOSIS — Z85118 Personal history of other malignant neoplasm of bronchus and lung: Secondary | ICD-10-CM | POA: Diagnosis not present

## 2023-09-25 DIAGNOSIS — C3490 Malignant neoplasm of unspecified part of unspecified bronchus or lung: Secondary | ICD-10-CM | POA: Diagnosis not present

## 2023-09-25 NOTE — Progress Notes (Signed)
 [facility] [phone]   [phone]  OFFICE PROGRESS NOTE  [name], FNP [address] [place] [address]  DIAGNOSIS: Stage IIIA (TX, N2, M0) non-small cell lung cancer, squamous cell carcinoma presented with subcarinal mass/lymphadenopathy diagnosed in July 2022.   PRIOR THERAPY:  1) Weekly carboplatin  for AUC of 2 and paclitaxel  45 mg/m2.  First dose expected on 11/02/2020.  Status post 7 cycles.  Last dose was given 12/14/2020. 2) Consolidation treatment with immunotherapy with Imfinzi  1500 Mg IV every 4 weeks.  First dose 01/14/2021.  Status post 13 cycles   CURRENT THERAPY: Observation.  INTERVAL HISTORY: Katrina Campbell 68 y.o. female returns to the clinic today for follow-up visit.Discussed the use of AI scribe software for clinical note transcription with the patient, who gave verbal consent to proceed.  History of Present Illness   Katrina Campbell is a 68 year old female with stage IIIA non-small cell lung cancer who presents for evaluation with repeat CT scan for restaging of her disease.  She was diagnosed with stage IIIA non-small cell lung cancer in July 2022 and underwent concurrent chemoradiation followed by one year of consolidation treatment with immunotherapy using Imfinzi . She has been under observation since September 2023.  Since Aug 10, 2023, she has experienced atrial fibrillation and was prescribed Eliquis . Since starting Eliquis , she has experienced worsening dyspnea, hypoxemia, decreased energy, and thrombocytopenia. She consulted her lung specialist due to significant dyspnea after hospitalization, but was informed that only her cardiologist could adjust her medication.  A CT scan from Aug 12, 2023, showed nodules that require follow-up in six months. She is concerned about these findings.  She describes her recurrent pneumonia as frequent but mild.  Her current medications include Eliquis , though the dosage and  frequency are not specified.  No fever or chills.         MEDICAL HISTORY: Past Medical History:  Diagnosis Date   Anxiety    COPD (chronic obstructive pulmonary disease) (HCC)    Coronary artery disease    Dental staining 07/15/2019   Depression    History of radiation therapy    Right lung- 11/03/20-12/15/20- Dr. 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  MEDICATIONS:  Current Outpatient Medications  Medication Sig Dispense Refill   acetaminophen  (TYLENOL ) 500 MG tablet Take 500 mg by mouth as needed for moderate pain (pain score 4-6) or headache.     albuterol  (PROVENTIL ) (2.5 MG/3ML) 0.083% nebulizer solution INHALE 3 MLS (2.5 MG TOTAL) VIA NEBULIZER EVERY 6 HOURS AS NEEDED FOR WHEEZING OR SHORTNESS OF BREATH 540 mL 7   albuterol  (VENTOLIN  HFA) 108 (90 Base) MCG/ACT inhaler Inhale 2 puffs into the lungs every 6 (six) hours as needed for wheezing or shortness of breath. 18 g 1   apixaban  (ELIQUIS ) 5 MG TABS tablet Take 1 tablet (5 mg total) by mouth 2 (two) times daily. 60 tablet 2   apixaban  (ELIQUIS ) 5 MG TABS tablet Take 1 tablet (5 mg total) by mouth 2 (two) times daily. 180 tablet 1   aspirin  EC 81 MG tablet Take 81 mg by mouth daily with lunch.     azithromycin  (ZITHROMAX  Z-PAK) 250 MG tablet Take 2 tablets today and one daily until completed. 6 each  0   Cholecalciferol (DIALYVITE VITAMIN D  5000) 125 MCG (5000 UT) capsule Take 5,000 Units by mouth daily.     esomeprazole  (NEXIUM ) 40 MG capsule TAKE 1 CAPSULE DAILY 90 capsule 3   ezetimibe  (ZETIA ) 10 MG tablet Take 1 tablet (10 mg total) by mouth daily. 90 tablet 3   ferrous sulfate  325 (65 FE) MG tablet Take 325 mg by mouth every other day.     Guaifenesin  (MUCINEX  MAXIMUM STRENGTH) 1200 MG TB12 Take  1,200 mg by mouth daily.     levothyroxine  (SYNTHROID ) 88 MCG tablet TAKE 1 TABLET(88 MCG) BY MOUTH DAILY 30 tablet 2   Multiple Vitamins-Minerals (IMMUNE SUPPORT PO) Take 1 capsule by mouth daily.     Respiratory Therapy Supplies (FLUTTER) DEVI Use as directed 1 each 0   rosuvastatin  (CRESTOR ) 20 MG tablet Take 1 tablet (20 mg total) by mouth daily. Must keep scheduled appointment for future refills. Thank you. 90 tablet 3   temazepam  (RESTORIL ) 15 MG capsule Take 1 capsule (15 mg total) by mouth at bedtime as needed for sleep. TAKE 1 CAPSULE AT BEDTIME AS NEEDED 90 capsule 0   TRELEGY ELLIPTA  100-62.5-25 MCG/ACT AEPB USE 1 INHALATION DAILY 180 each 3   vitamin B-12 (CYANOCOBALAMIN ) 1000 MCG tablet Take 1,000 mcg by mouth daily.     No current facility-administered medications for this visit.   Facility-Administered Medications Ordered in Other Visits  Medication Dose Route Frequency Provider Last Rate Last Admin   Sonafine emulsion 1 application  1 application  Topical Once Shannon Agent, MD        SURGICAL HISTORY:  Past Surgical History:  Procedure Laterality Date   BALLOON DILATION N/A 04/29/2021   Procedure: BALLOON DILATION;  Surgeon: Teressa Toribio SQUIBB, MD;  Location: [facility];  Service: Endoscopy;  Laterality: N/A;   BIOPSY  01/14/2021   Procedure: BIOPSY;  Surgeon: [name] Adine CROME, DO;  Location: [facility];  Service: Pulmonary;;   BRONCHIAL BIOPSY  10/08/2020   Procedure: BRONCHIAL BIOPSIES;  Surgeon: [name] Adine CROME, DO;  Location: [facility];  Service: Pulmonary;;   BRONCHIAL BRUSHINGS  10/08/2020   Procedure: BRONCHIAL BRUSHINGS;  Surgeon: [name] Adine CROME, DO;  Location: [facility];  Service: Pulmonary;;   BRONCHIAL BRUSHINGS  01/14/2021   Procedure: BRONCHIAL BRUSHINGS;  Surgeon: [name] Adine CROME, DO;  Location: [facility];  Service: Pulmonary;;   BRONCHIAL WASHINGS  10/08/2020   Procedure: BRONCHIAL WASHINGS;  Surgeon: [name] Adine CROME, DO;  Location: [facility];   Service: Pulmonary;;   BRONCHIAL WASHINGS  01/14/2021   Procedure: BRONCHIAL WASHINGS;  Surgeon: [name] Adine CROME, DO;  Location: [facility];  Service: Pulmonary;;   ESOPHAGOGASTRODUODENOSCOPY (EGD) WITH PROPOFOL  N/A 04/29/2021   Procedure: ESOPHAGOGASTRODUODENOSCOPY (EGD) WITH PROPOFOL ;  Surgeon: Teressa Toribio SQUIBB, MD;  Location: [facility];  Service: Endoscopy;  Laterality: N/A;   FINE NEEDLE ASPIRATION  10/08/2020   Procedure: FINE NEEDLE ASPIRATION (FNA) LINEAR;  Surgeon: [name] Adine CROME, DO;  Location: [facility];  Service: Pulmonary;;   LEFT HEART CATH AND CORONARY ANGIOGRAPHY N/A 11/07/2017   Procedure: LEFT HEART CATH AND CORONARY ANGIOGRAPHY;  Surgeon: Mady Bruckner, MD;  Location: [facility];  Service: Cardiovascular;  Laterality: N/A;   NASAL SINUS SURGERY  1986   VIDEO BRONCHOSCOPY Bilateral 01/29/2019   Procedure: VIDEO BRONCHOSCOPY WITH FLUORO;  Surgeon: Mannam, Praveen, MD;  Location: [facility];  Service: Cardiopulmonary;  Laterality: Bilateral;   VIDEO BRONCHOSCOPY Right 01/14/2021   Procedure: VIDEO BRONCHOSCOPY WITHOUT FLUORO;  Surgeon: [name],  Adine CROME, DO;  Location: [facility];  Service: Pulmonary;  Laterality: Right;  possible cryotherapy   VIDEO BRONCHOSCOPY WITH ENDOBRONCHIAL ULTRASOUND N/A 10/08/2020   Procedure: VIDEO BRONCHOSCOPY WITH ENDOBRONCHIAL ULTRASOUND;  Surgeon: [name] Adine CROME, DO;  Location: [facility];  Service: Pulmonary;  Laterality: N/A;    REVIEW OF SYSTEMS:  Constitutional: positive for fatigue Eyes: negative Ears, nose, mouth, throat, and face: negative Respiratory: positive for dyspnea on exertion Cardiovascular: negative Gastrointestinal: negative Genitourinary:negative Integument/breast: negative Hematologic/lymphatic: negative Musculoskeletal:negative Neurological: negative Behavioral/Psych: negative Endocrine: negative Allergic/Immunologic: negative   PHYSICAL EXAMINATION: General appearance: alert, cooperative,  fatigued, and no distress Head: Normocephalic, without obvious abnormality, atraumatic Neck: no adenopathy, no JVD, supple, symmetrical, trachea midline, and thyroid  not enlarged, symmetric, no tenderness/mass/nodules Lymph nodes: Cervical, supraclavicular, and axillary nodes normal. Resp: clear to auscultation bilaterally Back: symmetric, no curvature. ROM normal. No CVA tenderness. Cardio: regular rate and rhythm, S1, S2 normal, no murmur, click, rub or gallop GI: soft, non-tender; bowel sounds normal; no masses,  no organomegaly Extremities: extremities normal, atraumatic, no cyanosis or edema Neurologic: Alert and oriented X 3, normal strength and tone. Normal symmetric reflexes. Normal coordination and gait  ECOG PERFORMANCE STATUS: 1 - Symptomatic but completely ambulatory  Blood pressure 137/67, pulse 66, temperature 98 F (36.7 C), temperature source Temporal, resp. rate 18, height 5' 4 (1.626 m), weight 136 lb 11.2 oz (62 kg), SpO2 98%.  LABORATORY DATA: Lab Results  Component Value Date   WBC 4.6 09/12/2023   HGB 13.0 09/12/2023   HCT 37.4 09/12/2023   MCV 85.4 09/12/2023   PLT 111 (L) 09/12/2023      Chemistry      Component Value Date/Time   NA 139 09/12/2023 1008   NA 144 06/27/2023 0721   K 3.8 09/12/2023 1008   CL 107 09/12/2023 1008   CO2 28 09/12/2023 1008   BUN 11 09/12/2023 1008   BUN 17 06/27/2023 0721   CREATININE 0.73 09/12/2023 1008   CREATININE 0.96 07/22/2021 0936      Component Value Date/Time   CALCIUM  8.8 (L) 09/12/2023 1008   ALKPHOS 84 09/12/2023 1008   AST 25 09/12/2023 1008   ALT 25 09/12/2023 1008   BILITOT 0.4 09/12/2023 1008       RADIOGRAPHIC STUDIES: CT Chest W Contrast Result Date: 09/16/2023 EXAM: CT CHEST WITH CONTRAST 09/12/2023 12:11:36 PM TECHNIQUE: CT of the chest was performed with the administration of intravenous contrast. Multiplanar reformatted images are provided for review. Automated exposure control, iterative  reconstruction, and/or weight based adjustment of the mA/kV was utilized to reduce the radiation dose to as low as reasonably achievable. COMPARISON: None available. CLINICAL HISTORY: Non-small cell lung cancer (NSCLC), staging. Restaging lung cancer. History of former tobacco use, COPD, chronic right upper lobe atelectasis and Mycobacterium avium (treated x 1 yr), Bjerkandera adusta. Diagnosed with stage III A squamous cell lung cancer in 10/2020, treated with chemotherapy, XRT and then consolidation Imfinzi . Currently on observation. FINDINGS: MEDIASTINUM: Heart and pericardium are unremarkable. The central airways are clear. LYMPH NODES: No mediastinal, hilar or axillary lymphadenopathy. LUNGS AND PLEURA: Radiation changes with right upper lobe scarring/collapse. Moderate centrilobular and paraseptal emphysematous changes, upper lung predominant. Mild multifocal subpleural patchy opacities in the bilateral lower lobes (image 122), new, suggesting mild infection/pneumonia given rapid interval change. Trace bilateral pleural effusions. SOFT TISSUES/BONES: No acute abnormality of the bones or soft tissues. UPPER ABDOMEN: Nodular hepatic contour, suggesting cirrhosis. Mild atherosclerotic calcification of the abdominal aorta. IMPRESSION: 1. Radiation changes  with right upper lobe scarring/collapse. 2. No recurrent or metastatic disease. 3. Mild multifocal subpleural patchy opacities in the bilateral lower lobes, new, suggesting mild infection/pneumonia given rapid interval change. 4. Additionally ancillary findings as above. Electronically signed by: Pinkie Pebbles MD 09/16/2023 08:04 PM EDT RP Workstation: [id number]   LONG TERM MONITOR (3-14 DAYS) Result Date: 09/15/2023   Patient had a minimum heart rate of 50 bpm, maximum heart rate of 203 bpm, and average heart rate of 74 bpm. Predominant underlying rhythm was sinus rhythm. Frequent short bursts of SVT, longest 11 seconds. No atrial fibrillation. Isolated  PACs were rare (<1.0%). Isolated PVCs were rare (<1.0%). Triggered and diary events associated with sinus rhythm and sinus tachycardia. No atrial fibrillation.   ASSESSMENT AND PLAN: This is a very pleasant 68 years old white female recently diagnosed with a stage IIIA (TX, N2, M0) non-small cell lung cancer, squamous cell carcinoma presented with subcarinal mass/lymphadenopathy diagnosed in July 2022. The patient underwent a course of concurrent chemoradiation with weekly carboplatin  for AUC of 2 and paclitaxel  45 Mg/M2 status post 7 cycles. The patient tolerated her treatment well except for fatigue and sore throat. The patient completed a course of consolidation treatment with immunotherapy with Imfinzi  1500 Mg IV every 4 weeks status post 13 cycles.  The patient is currently on observation and she is feeling fine. She had repeat CT scan of the chest performed recently.  I personally and independently reviewed the scan and discussed the result with the patient today.  Her scan showed no concerning findings for disease recurrence or metastasis. Assessment and Plan    Stage IIIA non-small cell lung cancer Diagnosed in July 2022. Completed concurrent chemoradiation and one year of consolidation immunotherapy with Imfinzi . Under observation since September 2023. Recent CT scan shows no evidence of recurrent or metastatic disease, only mild lung inflammation, possibly related to previous pneumonia. - Continue observation - Schedule follow-up in six months with lab and scan a week prior  Mild lung inflammation Mild lung inflammation on recent CT scan, possibly related to previous pneumonia or other benign causes. No evidence of recurrent or metastatic disease. Advised to monitor for symptoms such as fever or chills. - Monitor for symptoms such as fever or chills - Contact pulmonologist if symptoms develop  Atrial fibrillation Atrial fibrillation episode on Aug 10, 2023. Managed with Eliquis .  Reports issues with oxygen  levels and energy since starting Eliquis . Cardiologist to evaluate the need for continued anticoagulation or alternative management. - Follow up with cardiologist for evaluation of anticoagulation management  Adverse effects of Eliquis  Reports decreased oxygen  levels, low energy, and decreased platelets since starting Eliquis . These are recognized side effects of the medication. Pulmonologist suggests she may not need Eliquis , but decision deferred to cardiologist. - Discuss potential side effects and management options with cardiologist    For the anemia, she will continue with the oral iron tablets with vitamin C. For the hypothyroidism she is currently on levothyroxine  125 mcg p.o. daily. She was advised to call immediately if she has any concerning symptoms in the interval. The total time spent in the appointment was 30 minutes including review of chart and various tests results, discussions about plan of care and coordination of care plan .  The patient voices understanding of current disease status and treatment options and is in agreement with the current care plan.  All questions were answered. The patient knows to call the clinic with any problems, questions or concerns. We can certainly see the  patient much sooner if necessary.  Disclaimer: This note was dictated with voice recognition software. Similar sounding words can inadvertently be transcribed and may not be corrected upon review.

## 2023-09-26 ENCOUNTER — Telehealth: Payer: Self-pay | Admitting: Internal Medicine

## 2023-09-26 ENCOUNTER — Ambulatory Visit (HOSPITAL_COMMUNITY)
Admission: RE | Admit: 2023-09-26 | Discharge: 2023-09-26 | Disposition: A | Discharge status: Home / Self Care | Source: Ambulatory Visit | Attending: Internal Medicine | Admitting: Internal Medicine

## 2023-09-26 VITALS — BP 150/82 | HR 66 | Ht 64.0 in | Wt 135.2 lb

## 2023-09-26 DIAGNOSIS — I700 Atherosclerosis of aorta: Secondary | ICD-10-CM | POA: Diagnosis not present

## 2023-09-26 DIAGNOSIS — E039 Hypothyroidism, unspecified: Secondary | ICD-10-CM | POA: Diagnosis not present

## 2023-09-26 DIAGNOSIS — I10 Essential (primary) hypertension: Secondary | ICD-10-CM | POA: Diagnosis not present

## 2023-09-26 DIAGNOSIS — I480 Paroxysmal atrial fibrillation: Secondary | ICD-10-CM | POA: Insufficient documentation

## 2023-09-26 DIAGNOSIS — I2510 Atherosclerotic heart disease of native coronary artery without angina pectoris: Secondary | ICD-10-CM | POA: Insufficient documentation

## 2023-09-26 DIAGNOSIS — D6869 Other thrombophilia: Secondary | ICD-10-CM | POA: Diagnosis not present

## 2023-09-26 DIAGNOSIS — I358 Other nonrheumatic aortic valve disorders: Secondary | ICD-10-CM | POA: Diagnosis not present

## 2023-09-26 DIAGNOSIS — E785 Hyperlipidemia, unspecified: Secondary | ICD-10-CM | POA: Insufficient documentation

## 2023-09-26 DIAGNOSIS — I4891 Unspecified atrial fibrillation: Secondary | ICD-10-CM

## 2023-09-26 DIAGNOSIS — J449 Chronic obstructive pulmonary disease, unspecified: Secondary | ICD-10-CM | POA: Diagnosis not present

## 2023-09-26 NOTE — Progress Notes (Addendum)
 Primary Care Physician: [name], FNP Primary Cardiologist: Stanly DELENA Leavens, MD Electrophysiologist: None     Referring Physician: Dr. Leavens Katrina Campbell is a 68 y.o. female with a history of HLD, HTN, hypothyroidism, stage III squamous cell carcinoma of right lung, GERD, COPD, aortic atherosclerosis, nonobstructive CAD, thoracic aortic aneurysm, and atrial fibrillation who presents for consultation in the [facility]. Hospital admission 5/8-10/25 for Afib with RVR; she converted to NSR. Dr. JAYSON noted if further episodes to consider amiodarone as bridge to ablation. Discharged on diltiazem  120 mg daily. Patient is on Eliquis  5 mg BID for a CHADS2VASC score of 4.  On evaluation today, she is currently in NSR. Diltiazem  held 5/19 due to patient calling office noting orthostatic hypotension. She still feels tired since hospital discharge. She has not felt any palpitations since discharge. She is wearing monitor. No missed doses of Eliquis .  On follow up 09/26/23, she is currently in NSR. Patient has noted decreased oxygen  levels, low energy, and decreased platelets since beginning Eliquis . She is interested in stopping medication. Reassuring cardiac monitor 08/2023 showing predominantly sinus rhythm.   Today, she denies symptoms of  chest pain, shortness of breath, orthopnea, PND, lower extremity edema, dizziness, presyncope, syncope, snoring, daytime somnolence, bleeding, or neurologic sequela. The patient is tolerating medications without difficulties and is otherwise without complaint today.    she has a BMI of Body mass index is 23.21 kg/m.[name] Filed Weights   09/26/23 0821  Weight: 61.3 kg    Current Outpatient Medications  Medication Sig Dispense Refill   acetaminophen  (TYLENOL ) 500 MG tablet Take 500 mg by mouth as needed for moderate pain (pain score 4-6) or headache.     albuterol  (PROVENTIL ) (2.5 MG/3ML) 0.083% nebulizer solution  INHALE 3 MLS (2.5 MG TOTAL) VIA NEBULIZER EVERY 6 HOURS AS NEEDED FOR WHEEZING OR SHORTNESS OF BREATH 540 mL 7   albuterol  (VENTOLIN  HFA) 108 (90 Base) MCG/ACT inhaler Inhale 2 puffs into the lungs every 6 (six) hours as needed for wheezing or shortness of breath. 18 g 1   Cholecalciferol (DIALYVITE VITAMIN D  5000) 125 MCG (5000 UT) capsule Take 5,000 Units by mouth daily.     esomeprazole  (NEXIUM ) 40 MG capsule TAKE 1 CAPSULE DAILY 90 capsule 3   ezetimibe  (ZETIA ) 10 MG tablet Take 1 tablet (10 mg total) by mouth daily. 90 tablet 3   ferrous sulfate  325 (65 FE) MG tablet Take 325 mg by mouth every other day.     Guaifenesin  (MUCINEX  MAXIMUM STRENGTH) 1200 MG TB12 Take 1,200 mg by mouth daily.     levothyroxine  (SYNTHROID ) 88 MCG tablet TAKE 1 TABLET(88 MCG) BY MOUTH DAILY 30 tablet 2   Multiple Vitamins-Minerals (IMMUNE SUPPORT PO) Take 1 capsule by mouth daily.     Respiratory Therapy Supplies (FLUTTER) DEVI Use as directed 1 each 0   rosuvastatin  (CRESTOR ) 20 MG tablet Take 1 tablet (20 mg total) by mouth daily. Must keep scheduled appointment for future refills. Thank you. 90 tablet 3   temazepam  (RESTORIL ) 15 MG capsule Take 1 capsule (15 mg total) by mouth at bedtime as needed for sleep. TAKE 1 CAPSULE AT BEDTIME AS NEEDED 90 capsule 0   TRELEGY ELLIPTA  100-62.5-25 MCG/ACT AEPB USE 1 INHALATION DAILY 180 each 3   vitamin B-12 (CYANOCOBALAMIN ) 1000 MCG tablet Take 1,000 mcg by mouth daily.     No current facility-administered medications for this encounter.   Facility-Administered Medications Ordered in  Other Encounters  Medication Dose Route Frequency Provider Last Rate Last Admin   Sonafine emulsion 1 application  1 application  Topical Once Shannon Agent, MD        Atrial Fibrillation Management history:  Previous antiarrhythmic drugs: none Previous cardioversions: none Previous ablations: none Anticoagulation history: Eliquis    ROS- All systems are reviewed and negative except  as per the HPI above.  Physical Exam: BP (!) 150/82   Pulse 66   Ht 5' 4 (1.626 m)   Wt 61.3 kg   BMI 23.21 kg/m   GEN- The patient is well appearing, alert and oriented x 3 today.   Neck - no JVD or carotid bruit noted Lungs- Clear to ausculation bilaterally, normal work of breathing Heart- Regular rate and rhythm, no murmurs, rubs or gallops, PMI not laterally displaced Extremities- no clubbing, cyanosis, or edema Skin - no rash or ecchymosis noted   EKG today demonstrates  Vent. rate 66 BPM PR interval 134 ms QRS duration 72 ms QT/QTcB 410/429 ms P-R-T axes 66 59 65 Normal sinus rhythm Anterior infarct (cited on or before 15-Jun-2023) Abnormal ECG When compared with ECG of 24-Aug-2023 08:37, No significant change was found   Echo 08/11/23 demonstrated   1. Patient appears to be in rapid afib during study.   2. Left ventricular ejection fraction, by estimation, is 55 to 60%. The  left ventricle has normal function. The left ventricle has no regional  wall motion abnormalities. Left ventricular diastolic parameters are  indeterminate.   3. Right ventricular systolic function is normal. The right ventricular  size is normal.   4. A small pericardial effusion is present. The pericardial effusion is  anterior to the right ventricle.   5. The mitral valve is abnormal. Trivial mitral valve regurgitation. No  evidence of mitral stenosis.   6. The aortic valve is tricuspid. There is mild calcification of the  aortic valve. There is mild thickening of the aortic valve. Aortic valve  regurgitation is not visualized. Aortic valve sclerosis is present, with  no evidence of aortic valve stenosis.   7. The inferior vena cava is normal in size with greater than 50%  respiratory variability, suggesting right atrial pressure of 3 mmHg.   ASSESSMENT & PLAN CHA2DS2-VASc Score = 4  The patient's score is based upon: CHF History: 0 HTN History: 1 Diabetes History: 0 Stroke  History: 0 Vascular Disease History: 1 Age Score: 1 Gender Score: 1       ASSESSMENT AND PLAN: Paroxysmal Atrial Fibrillation (ICD10:  I48.0) The patient's CHA2DS2-VASc score is 4, indicating a 4.8% annual risk of stroke.    She is currently in NSR. Continue conservative observation via watch.   Secondary Hypercoagulable State (ICD10:  D68.69) The patient is at significant risk for stroke/thromboembolism based upon her CHA2DS2-VASc Score of 4.  Continue Apixaban  (Eliquis ).  Patient wishes to stop anticoagulation at this time due to adverse effects. She wants to stop Eliquis  and does not wish to try other OAC. We discussed her risk of stroke related to Afib which is significant. I specifically advised patient she can consider other options such as ILR implantation or trial of different OAC for protection against stroke related to Afib. Patient declines this and understands the risk of getting a stroke related to Afib due to not being on anticoagulation.     Follow up Afib clinic prn.   Katrina Pac, PA-C  [facility] [address] [place], [place] [address] [phone]

## 2023-09-26 NOTE — Telephone Encounter (Signed)
 Scheduled appointments per 6/23 los. Talked with the patient and she is aware of the made appointments.

## 2023-09-27 ENCOUNTER — Other Ambulatory Visit

## 2023-09-28 ENCOUNTER — Other Ambulatory Visit

## 2023-09-28 DIAGNOSIS — C3491 Malignant neoplasm of unspecified part of right bronchus or lung: Secondary | ICD-10-CM

## 2023-09-28 DIAGNOSIS — E039 Hypothyroidism, unspecified: Secondary | ICD-10-CM

## 2023-09-28 DIAGNOSIS — R7989 Other specified abnormal findings of blood chemistry: Secondary | ICD-10-CM

## 2023-09-28 DIAGNOSIS — R0989 Other specified symptoms and signs involving the circulatory and respiratory systems: Secondary | ICD-10-CM

## 2023-09-28 DIAGNOSIS — E785 Hyperlipidemia, unspecified: Secondary | ICD-10-CM

## 2023-09-29 LAB — CBC WITH DIFFERENTIAL/PLATELET
Absolute Lymphocytes: 1518 {cells}/uL (ref 850–3900)
Absolute Monocytes: 353 {cells}/uL (ref 200–950)
Basophils Absolute: 9 {cells}/uL (ref 0–200)
Basophils Relative: 0.2 %
Eosinophils Absolute: 89 {cells}/uL (ref 15–500)
Eosinophils Relative: 1.9 %
HCT: 42.7 % (ref 35.0–45.0)
Hemoglobin: 13.9 g/dL (ref 11.7–15.5)
MCH: 29.5 pg (ref 27.0–33.0)
MCHC: 32.6 g/dL (ref 32.0–36.0)
MCV: 90.7 fL (ref 80.0–100.0)
MPV: 11.6 fL (ref 7.5–12.5)
Monocytes Relative: 7.5 %
Neutro Abs: 2731 {cells}/uL (ref 1500–7800)
Neutrophils Relative %: 58.1 %
Platelets: 120 10*3/uL — ABNORMAL LOW (ref 140–400)
RBC: 4.71 10*6/uL (ref 3.80–5.10)
RDW: 12.9 % (ref 11.0–15.0)
Total Lymphocyte: 32.3 %
WBC: 4.7 10*3/uL (ref 3.8–10.8)

## 2023-09-29 LAB — COMPLETE METABOLIC PANEL WITHOUT GFR
AG Ratio: 2.3 (calc) (ref 1.0–2.5)
ALT: 36 U/L — ABNORMAL HIGH (ref 6–29)
AST: 30 U/L (ref 10–35)
Albumin: 4.4 g/dL (ref 3.6–5.1)
Alkaline phosphatase (APISO): 94 U/L (ref 37–153)
BUN: 11 mg/dL (ref 7–25)
CO2: 26 mmol/L (ref 20–32)
Calcium: 9.2 mg/dL (ref 8.6–10.4)
Chloride: 107 mmol/L (ref 98–110)
Creat: 0.63 mg/dL (ref 0.60–1.00)
Globulin: 1.9 g/dL (ref 1.9–3.7)
Glucose, Bld: 88 mg/dL (ref 65–99)
Potassium: 4.5 mmol/L (ref 3.5–5.3)
Sodium: 141 mmol/L (ref 135–146)
Total Bilirubin: 0.6 mg/dL (ref 0.2–1.2)
Total Protein: 6.3 g/dL (ref 6.1–8.1)

## 2023-09-29 LAB — LIPID PANEL
Cholesterol: 127 mg/dL (ref <200)
HDL: 49 mg/dL — ABNORMAL LOW (ref >=50)
LDL Cholesterol (Calc): 61 mg/dL
Non-HDL Cholesterol (Calc): 78 mg/dL (ref <130)
Total CHOL/HDL Ratio: 2.6 (calc) (ref <5.0)
Triglycerides: 85 mg/dL (ref <150)

## 2023-09-29 LAB — TSH: TSH: 2.31 m[IU]/L (ref 0.40–4.50)

## 2023-10-02 ENCOUNTER — Encounter: Payer: Self-pay | Admitting: Family Medicine

## 2023-10-02 ENCOUNTER — Ambulatory Visit: Admitting: Family Medicine

## 2023-10-02 VITALS — BP 127/76 | HR 78 | Temp 97.7°F | Ht 64.0 in | Wt 135.8 lb

## 2023-10-02 DIAGNOSIS — E039 Hypothyroidism, unspecified: Secondary | ICD-10-CM

## 2023-10-02 DIAGNOSIS — F5104 Psychophysiologic insomnia: Secondary | ICD-10-CM

## 2023-10-02 DIAGNOSIS — J449 Chronic obstructive pulmonary disease, unspecified: Secondary | ICD-10-CM

## 2023-10-02 DIAGNOSIS — Z1211 Encounter for screening for malignant neoplasm of colon: Secondary | ICD-10-CM

## 2023-10-02 DIAGNOSIS — I2510 Atherosclerotic heart disease of native coronary artery without angina pectoris: Secondary | ICD-10-CM | POA: Diagnosis not present

## 2023-10-02 DIAGNOSIS — E785 Hyperlipidemia, unspecified: Secondary | ICD-10-CM

## 2023-10-02 DIAGNOSIS — Z0000 Encounter for general adult medical examination without abnormal findings: Secondary | ICD-10-CM

## 2023-10-02 DIAGNOSIS — Z0001 Encounter for general adult medical examination with abnormal findings: Secondary | ICD-10-CM

## 2023-10-02 DIAGNOSIS — Z8679 Personal history of other diseases of the circulatory system: Secondary | ICD-10-CM

## 2023-10-02 MED ORDER — TEMAZEPAM 22.5 MG PO CAPS: 22.5000 mg | ORAL_CAPSULE | Freq: Every evening | ORAL | 1 refills | Status: DC | PRN

## 2023-10-02 NOTE — Assessment & Plan Note (Signed)
 SOB exacerbated during recent hospitalization for atrial fibrillation. Has stopped eliquis  due to SOB. Regular rate and rhythm today and she is close to her baseline respiratory status.

## 2023-10-02 NOTE — Assessment & Plan Note (Signed)
 Restoril  losing effectiveness, has trouble with sleep maintenance. Declines sleep apnea testing at this time due to presumed inability to tolerate CPAP, will consider. Increase Temazepam  to 22.5mg  nightly. Return to office if symptoms persist or worsen.

## 2023-10-02 NOTE — Patient Instructions (Signed)
  Katrina Campbell , Thank you for taking time to come for your Medicare Wellness Visit. I appreciate your ongoing commitment to your health goals. Please review the following plan we discussed and let me know if I can assist you in the future.   These are the goals we discussed:  Goals      DIET - REDUCE SALT INTAKE TO 2 GRAMS PER DAY OR LESS     Would like to work on reducing cholesterol.        This is a list of the screening recommended for you and due dates:  Health Maintenance  Topic Date Due   Colon Cancer Screening  08/29/2023   COVID-19 Vaccine (3 - Pfizer risk series) 10/18/2023*   Zoster (Shingles) Vaccine (1 of 2) 01/02/2024*   DTaP/Tdap/Td vaccine (1 - Tdap) 06/27/2024*   Pneumococcal Vaccine for age over 94 (1 of 2 - PCV) 06/27/2024*   Flu Shot  11/03/2023   Medicare Annual Wellness Visit  10/01/2024   Mammogram  11/07/2024   DEXA scan (bone density measurement)  Completed   Hepatitis C Screening  Completed   Hepatitis B Vaccine  Aged Out   HPV Vaccine  Aged Out   Meningitis B Vaccine  Aged Out  *Topic was postponed. The date shown is not the original due date.

## 2023-10-02 NOTE — Assessment & Plan Note (Signed)
 Not taking Diltiazem  or Eliquis , aware of risks, normal rate and rhythm on exam today.

## 2023-10-02 NOTE — Assessment & Plan Note (Signed)
 Followed by Cardiology. Comorbid conditions well controlled including glucose, cholesterol, and blood pressure. No chest pain. CMP, lipids, TSH normal.   History per Cardiology: Heart catheterization August 2019: Mild CAD, reported thoracic aortic aneurysm. Coronary CTA November 2021: Total CAC 261, 90th percentile, mild nonobstructive CAD. Patient has completely stopped smoking.

## 2023-10-02 NOTE — Assessment & Plan Note (Signed)
 Continue Cresor 20mg  daily.  I recommend consuming a heart healthy diet such as Mediterranean diet or DASH diet with whole grains, fruits, vegetable, fish, lean meats, nuts, and olive oil. Limit sweets and processed foods. I also encourage moderate intensity exercise 150 minutes weekly. This is 3-5 times weekly for 30-50 minutes each session. Goal should be pace of 3 miles/hours, or walking 1.5 miles in 30 minutes. The ASCVD Risk score (Arnett DK, et al., 2019) failed to calculate for the following reasons:   Risk score cannot be calculated because patient has a medical history suggesting prior/existing ASCVD

## 2023-10-02 NOTE — Assessment & Plan Note (Signed)
 Followed by Endo, euthyroid on synthroid  daily. TSH 2.31

## 2023-10-02 NOTE — Progress Notes (Signed)
 Subjective:    Katrina Campbell is a 68 y.o. female who presents for a Welcome to Medicare exam.   Cardiac Risk Factors include: family history of premature cardiovascular disease;hypertension      Objective:    Today's Vitals   10/02/23 1403  BP: 127/76  Pulse: 78  Temp: 97.7 F (36.5 C)  SpO2: 96%  Weight: 135 lb 12.8 oz (61.6 kg)  Height: 5' 4 (1.626 m)  PainSc: 0-No pain  Body mass index is 23.31 kg/m.  Medications Outpatient Encounter Medications as of 10/02/2023  Medication Sig   acetaminophen  (TYLENOL ) 500 MG tablet Take 500 mg by mouth as needed for moderate pain (pain score 4-6) or headache.   albuterol  (PROVENTIL ) (2.5 MG/3ML) 0.083% nebulizer solution INHALE 3 MLS (2.5 MG TOTAL) VIA NEBULIZER EVERY 6 HOURS AS NEEDED FOR WHEEZING OR SHORTNESS OF BREATH   albuterol  (VENTOLIN  HFA) 108 (90 Base) MCG/ACT inhaler Inhale 2 puffs into the lungs every 6 (six) hours as needed for wheezing or shortness of breath.   Cholecalciferol (DIALYVITE VITAMIN D  5000) 125 MCG (5000 UT) capsule Take 5,000 Units by mouth daily.   esomeprazole  (NEXIUM ) 40 MG capsule TAKE 1 CAPSULE DAILY   ezetimibe  (ZETIA ) 10 MG tablet Take 1 tablet (10 mg total) by mouth daily.   ferrous sulfate  325 (65 FE) MG tablet Take 325 mg by mouth every other day.   Guaifenesin  (MUCINEX  MAXIMUM STRENGTH) 1200 MG TB12 Take 1,200 mg by mouth daily.   levothyroxine  (SYNTHROID ) 88 MCG tablet TAKE 1 TABLET(88 MCG) BY MOUTH DAILY   Multiple Vitamins-Minerals (IMMUNE SUPPORT PO) Take 1 capsule by mouth daily.   Respiratory Therapy Supplies (FLUTTER) DEVI Use as directed   rosuvastatin  (CRESTOR ) 20 MG tablet Take 1 tablet (20 mg total) by mouth daily. Must keep scheduled appointment for future refills. Thank you.   TRELEGY ELLIPTA  100-62.5-25 MCG/ACT AEPB USE 1 INHALATION DAILY   vitamin B-12 (CYANOCOBALAMIN ) 1000 MCG tablet Take 1,000 mcg by mouth daily.   [DISCONTINUED] temazepam  (RESTORIL ) 15 MG capsule Take 1 capsule (15  mg total) by mouth at bedtime as needed for sleep. TAKE 1 CAPSULE AT BEDTIME AS NEEDED   temazepam  (RESTORIL ) 22.5 MG capsule Take 1 capsule (22.5 mg total) by mouth at bedtime as needed for sleep. TAKE 1 CAPSULE AT BEDTIME AS NEEDED   Facility-Administered Encounter Medications as of 10/02/2023  Medication   Sonafine emulsion 1 application     History: Past Medical History:  Diagnosis Date   Anxiety    COPD (chronic obstructive pulmonary disease) (HCC)    Coronary artery disease    Dental staining 07/15/2019   Depression    History of radiation therapy    Right lung- 11/03/20-12/15/20- Dr. 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   Past Surgical History:  Procedure Laterality Date   BALLOON DILATION N/A 04/29/2021   Procedure: BALLOON DILATION;  Surgeon: Teressa Toribio SQUIBB, MD;  Location: [facility];  Service: Endoscopy;  Laterality: N/A;   BIOPSY  01/14/2021   Procedure: BIOPSY;  Surgeon: [name] Adine CROME, DO;  [facility];  Service: Pulmonary;;   BRONCHIAL BIOPSY  10/08/2020   Procedure: BRONCHIAL BIOPSIES;  Surgeon: [name] Adine CROME, DO;  [facility];  Service: Pulmonary;;   BRONCHIAL BRUSHINGS  10/08/2020   Procedure: BRONCHIAL BRUSHINGS;  Surgeon: [name] Adine CROME, DO;  [facility];  Service: Pulmonary;;  BRONCHIAL BRUSHINGS  01/14/2021   Procedure: BRONCHIAL BRUSHINGS;  Surgeon: [name] Adine CROME, DO;  [facility];  Service: Pulmonary;;   BRONCHIAL WASHINGS  10/08/2020   Procedure: BRONCHIAL WASHINGS;  Surgeon: [name] Adine CROME, DO;  [facility];  Service: Pulmonary;;   BRONCHIAL WASHINGS  01/14/2021   Procedure: BRONCHIAL WASHINGS;  Surgeon: [name] Adine CROME, DO;  [facility];  Service: Pulmonary;;   ESOPHAGOGASTRODUODENOSCOPY (EGD) WITH PROPOFOL  N/A 04/29/2021   Procedure: ESOPHAGOGASTRODUODENOSCOPY  (EGD) WITH PROPOFOL ;  Surgeon: Teressa Toribio SQUIBB, MD;  [facility]: [facility];  Service: Endoscopy;  Laterality: N/A;   FINE NEEDLE ASPIRATION  10/08/2020   Procedure: FINE NEEDLE ASPIRATION (FNA) LINEAR;  Surgeon: [name] Adine CROME, DO;  [facility];  Service: Pulmonary;;   LEFT HEART CATH AND CORONARY ANGIOGRAPHY N/A 11/07/2017   Procedure: LEFT HEART CATH AND CORONARY ANGIOGRAPHY;  Surgeon: Mady Bruckner, MD;  [facility];  Service: Cardiovascular;  Laterality: N/A;   NASAL SINUS SURGERY  1986   VIDEO BRONCHOSCOPY Bilateral 01/29/2019   Procedure: VIDEO BRONCHOSCOPY WITH FLUORO;  Surgeon: Mannam, Praveen, MD;  [facility];  Service: Cardiopulmonary;  Laterality: Bilateral;   VIDEO BRONCHOSCOPY Right 01/14/2021   Procedure: VIDEO BRONCHOSCOPY WITHOUT FLUORO;  Surgeon: [name] Adine CROME, DO;  [facility];  Service: Pulmonary;  Laterality: Right;  possible cryotherapy   VIDEO BRONCHOSCOPY WITH ENDOBRONCHIAL ULTRASOUND N/A 10/08/2020   Procedure: VIDEO BRONCHOSCOPY WITH ENDOBRONCHIAL ULTRASOUND;  Surgeon: [name] Adine CROME, DO;  [facility];  Service: Pulmonary;  Laterality: N/A;    Family History  Problem Relation Age of Onset   Heart attack Mother    Emphysema Mother    Congestive Heart Failure Mother    Heart attack Father    Congestive Heart Failure Father    Asthma Other    Lung disease Sister    Lung disease Brother    Colon cancer Neg Hx    Social History   Occupational History   Not on file  Tobacco Use   Smoking status: Former    Current packs/day: 0.00    Average packs/day: 1.8 packs/day for 44.0 years (77.0 ttl pk-yrs)    Types: Cigarettes    Start date: 11/09/1968    Quit date: 11/09/2012    Years since quitting: 10.9   Smokeless tobacco: Never   Tobacco comments:    vapor  Vaping Use   Vaping status: Former  Substance and Sexual Activity   Alcohol use: Not Currently    Alcohol/week: 24.0 standard drinks of  alcohol    Types: 24 Cans of beer per week   Drug use: Not Currently   Sexual activity: Not Currently    Tobacco Counseling Counseling given: Not Answered Tobacco comments: vapor   Immunizations and Health Maintenance Immunization History  Administered Date(s) Administered   Fluad Quad(high Dose 65+) 03/15/2019   Influenza Inj Mdck Quad Pf 03/21/2018   Influenza, High Dose Seasonal PF 03/21/2018   Influenza,inj,Quad PF,6+ Mos 04/14/2020   PFIZER(Purple Top)SARS-COV-2 Vaccination 05/29/2019, 06/19/2019   Health Maintenance Due  Topic Date Due   Colonoscopy  08/29/2023    Activities of Daily Living    10/02/2023    2:28 PM 08/11/2023   10:44 AM  In your present state of health, do you have any difficulty performing the following activities:  Hearing? 0 0  Vision? 1 0  Comment [employer]   Difficulty concentrating or making decisions? 0 0  Walking or climbing stairs? 0  Dressing or bathing? 0   Doing errands, shopping? 0 0  Preparing Food and eating ? N   Using the Toilet? N   In the past six months, have you accidently leaked urine? N   Do you have problems with loss of bowel control? N   Managing your Medications? N   Managing your Finances? N   Housekeeping or managing your Housekeeping? N     Physical Exam   Physical Exam (optional), or other factors deemed appropriate based on the beneficiary's medical and social history and current clinical standards.   Advanced Directives: Does Patient Have a Medical Advance Directive?: No Would patient like information on creating a medical advance directive?: Yes (Inpatient - patient requests chaplain consult to create a medical advance directive)  EKG: declined      Assessment:    This is a routine wellness examination for this patient . yes  Vision/Hearing screen No results found.   Goals      DIET - REDUCE SALT INTAKE TO 2 GRAMS PER DAY OR LESS     Would like to work on reducing cholesterol.         Depression Screen    10/02/2023    2:34 PM 06/28/2023    2:58 PM 01/28/2022   10:17 AM 08/27/2021    1:31 PM  PHQ 2/9 Scores  PHQ - 2 Score 0 0 0 0  PHQ- 9 Score 5   3     Fall Risk    10/02/2023    2:30 PM  Fall Risk   Falls in the past year? 0  Number falls in past yr: 0  Injury with Fall? 0  Risk for fall due to : No Fall Risks;Other (Comment)  Risk for fall due to: Comment age  Follow up Falls evaluation completed    Cognitive Function:        10/02/2023    2:32 PM  6CIT Screen  What Year? 0 points  What month? 0 points  What time? 0 points  Count back from 20 0 points  Months in reverse 0 points  Repeat phrase 0 points  Total Score 0 points    Patient Care Team: [name], FNP as PCP - General (Family Medicine) Santo Stanly LABOR, MD as PCP - Cardiology (Cardiology) Ivin Kocher, MD as Consulting Physician (Dermatology) [name] Glendia HERO, MD as Consulting Physician (Pulmonary Disease)     Plan:     I have personally reviewed and noted the following in the patient's chart:   Medical and social history Use of alcohol, tobacco or illicit drugs  Current medications and supplements including opioid prescriptions. Patient is not currently taking opioid prescriptions. Functional ability and status Nutritional status Physical activity Advanced directives List of other physicians Hospitalizations, surgeries, and ER visits in previous 12 months Vitals Screenings to include cognitive, depression, and falls Referrals and appointments  In addition, I have reviewed and discussed with patient certain preventive protocols, quality metrics, and best practice recommendations. A written personalized care plan for preventive services as well as general preventive health recommendations were provided to patient.     Jeoffrey RAMAN Kayla, [place] 10/02/2023

## 2023-10-02 NOTE — Progress Notes (Signed)
 Subjective:  HPI: Katrina Campbell is a 68 y.o. female presenting on 10/02/2023 for Annual Exam (Welcome to [employer] /Would like to discuss cologuard )   HPI Patient is in today for AWV and follow up for chronic conditions. PMH includes HLD, HTN, hypothyroidism, stage III SCC right lung, GERD, COPD, aortic atherosclerosis, CAD, thoracic aortic aneurysm, atrial fibrillation. She is followed by Cardiology, Pulmonology, Oncology.  Atrial fibrillation: followed by Cardiology Dr Terra, Diltiazem  stopped due to orthostatic hypotension and Eliquis  stopped due to adverse effects understanding risks of this, persistent SOB and fatigue  Stage IIIA non-small cell lung cancer: diagnosed July 2022, treated with chemoradiation followed by one year consolidation treatment with immunotherapy using Imfinzi , currently in observation q6mo CT, persistent SOB and fatigue  HTN: BP well controlled  HLD: Improved on Rosuvastatin  20mg  daily and Zetia  10mg  daily  Hypothyroidism: followed by Endocrinology, on 88mcg daily synthroid , TSH 2.31  Insomnia: worsening sleep maintenance with approx 3 hours of sleep per night, on Restoril  15mg  nightly, has tried melatonin and wellbutrin   Review of Systems  Constitutional:  Positive for malaise/fatigue.  Respiratory:  Positive for shortness of breath.   Cardiovascular: Negative.   All other systems reviewed and are negative.   Relevant past medical history reviewed and updated as indicated.   Past Medical History:  Diagnosis Date   Anxiety    COPD (chronic obstructive pulmonary disease) (HCC)    Coronary artery disease    Dental staining 07/15/2019   Depression    History of radiation therapy    Right lung- 11/03/20-12/15/20- Dr. 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     Past Surgical History:  Procedure Laterality Date   BALLOON DILATION N/A  04/29/2021   Procedure: BALLOON DILATION;  Surgeon: Teressa Toribio SQUIBB, MD;  [facility];  Service: Endoscopy;  Laterality: N/A;   BIOPSY  01/14/2021   Procedure: BIOPSY;  Surgeon: [name] Adine CROME, DO;  [facility];  Service: Pulmonary;;   BRONCHIAL BIOPSY  10/08/2020   Procedure: BRONCHIAL BIOPSIES;  Surgeon: [name] Adine CROME, DO;  [facility];  Service: Pulmonary;;   BRONCHIAL BRUSHINGS  10/08/2020   Procedure: BRONCHIAL BRUSHINGS;  Surgeon: [name] Adine CROME, DO;  [facility];  Service: Pulmonary;;   BRONCHIAL BRUSHINGS  01/14/2021   Procedure: BRONCHIAL BRUSHINGS;  Surgeon: [name] Adine CROME, DO;  [facility];  Service: Pulmonary;;   BRONCHIAL WASHINGS  10/08/2020   Procedure: BRONCHIAL WASHINGS;  Surgeon: [name] Adine CROME, DO;  [facility];  Service: Pulmonary;;   BRONCHIAL WASHINGS  01/14/2021   Procedure: BRONCHIAL WASHINGS;  Surgeon: [name] Adine CROME, DO;  [facility];  Service: Pulmonary;;   ESOPHAGOGASTRODUODENOSCOPY (EGD) WITH PROPOFOL  N/A 04/29/2021   Procedure: ESOPHAGOGASTRODUODENOSCOPY (EGD) WITH PROPOFOL ;  Surgeon: Teressa Toribio SQUIBB, MD;  [facility];  Service: Endoscopy;  Laterality: N/A;   FINE NEEDLE ASPIRATION  10/08/2020   Procedure: FINE NEEDLE ASPIRATION (FNA) LINEAR;  Surgeon: [name] Adine CROME, DO;  [facility];  Service: Pulmonary;;   LEFT HEART CATH AND CORONARY ANGIOGRAPHY N/A 11/07/2017   Procedure: LEFT HEART CATH AND CORONARY ANGIOGRAPHY;  Surgeon: Mady Bruckner, MD;  [facility];  Service: Cardiovascular;  Laterality: N/A;   NASAL SINUS SURGERY  1986   VIDEO BRONCHOSCOPY Bilateral 01/29/2019   Procedure: VIDEO BRONCHOSCOPY WITH FLUORO;  Surgeon: Mannam, Praveen, MD;  [facility];  Service: Cardiopulmonary;  Laterality: Bilateral;   VIDEO BRONCHOSCOPY Right 01/14/2021   Procedure: VIDEO BRONCHOSCOPY WITHOUT FLUORO;  Surgeon: [name] Adine CROME, DO;   [facility];  Service: Pulmonary;  Laterality: Right;  possible cryotherapy   VIDEO BRONCHOSCOPY WITH ENDOBRONCHIAL ULTRASOUND N/A 10/08/2020   Procedure: VIDEO BRONCHOSCOPY WITH ENDOBRONCHIAL ULTRASOUND;  Surgeon: [name] Adine CROME, DO;  [facility];  Service: Pulmonary;  Laterality: N/A;    Allergies and medications reviewed and updated.   Current Outpatient Medications:    acetaminophen  (TYLENOL ) 500 MG tablet, Take 500 mg by mouth as needed for moderate pain (pain score 4-6) or headache., Disp: , Rfl:    albuterol  (PROVENTIL ) (2.5 MG/3ML) 0.083% nebulizer solution, INHALE 3 MLS (2.5 MG TOTAL) VIA NEBULIZER EVERY 6 HOURS AS NEEDED FOR WHEEZING OR SHORTNESS OF BREATH, Disp: 540 mL, Rfl: 7   albuterol  (VENTOLIN  HFA) 108 (90 Base) MCG/ACT inhaler, Inhale 2 puffs into the lungs every 6 (six) hours as needed for wheezing or shortness of breath., Disp: 18 g, Rfl: 1   Cholecalciferol (DIALYVITE VITAMIN D  5000) 125 MCG (5000 UT) capsule, Take 5,000 Units by mouth daily., Disp: , Rfl:    esomeprazole  (NEXIUM ) 40 MG capsule, TAKE 1 CAPSULE DAILY, Disp: 90 capsule, Rfl: 3   ezetimibe  (ZETIA ) 10 MG tablet, Take 1 tablet (10 mg total) by mouth daily., Disp: 90 tablet, Rfl: 3   ferrous sulfate  325 (65 FE) MG tablet, Take 325 mg by mouth every other day., Disp: , Rfl:    Guaifenesin  (MUCINEX  MAXIMUM STRENGTH) 1200 MG TB12, Take 1,200 mg by mouth daily., Disp: , Rfl:    levothyroxine  (SYNTHROID ) 88 MCG tablet, TAKE 1 TABLET(88 MCG) BY MOUTH DAILY, Disp: 30 tablet, Rfl: 2   Multiple Vitamins-Minerals (IMMUNE SUPPORT PO), Take 1 capsule by mouth daily., Disp: , Rfl:    Respiratory Therapy Supplies (FLUTTER) DEVI, Use as directed, Disp: 1 each, Rfl: 0   rosuvastatin  (CRESTOR ) 20 MG tablet, Take 1 tablet (20 mg total) by mouth daily. Must keep scheduled appointment for future refills. Thank you., Disp: 90 tablet, Rfl: 3   TRELEGY ELLIPTA  100-62.5-25 MCG/ACT AEPB, USE 1 INHALATION DAILY, Disp:  180 each, Rfl: 3   vitamin B-12 (CYANOCOBALAMIN ) 1000 MCG tablet, Take 1,000 mcg by mouth daily., Disp: , Rfl:    temazepam  (RESTORIL ) 22.5 MG capsule, Take 1 capsule (22.5 mg total) by mouth at bedtime as needed for sleep. TAKE 1 CAPSULE AT BEDTIME AS NEEDED, Disp: 90 capsule, Rfl: 1 No current facility-administered medications for this visit.  Facility-Administered Medications Ordered in Other Visits:    Sonafine emulsion 1 application, 1 application , Topical, Once, Shannon Agent, MD  Allergies  Allergen Reactions   Breztri  Aerosphere [Budeson-Glycopyrrol-Formoterol ] Shortness Of Breath   Codeine Other (See Comments)    Causes pt to blackout   Statins     Myalgias at higher doses    Objective:   BP 127/76   Pulse 78   Temp 97.7 F (36.5 C)   Ht 5' 4 (1.626 m)   Wt 135 lb 12.8 oz (61.6 kg)   SpO2 96%   BMI 23.31 kg/m      10/02/2023    2:03 PM 09/26/2023    8:21 AM 09/25/2023   10:49 AM  Vitals with BMI  Height 5' 4 5' 4 5' 4  Weight 135 lbs 13 oz 135 lbs 3 oz 136 lbs 11 oz  BMI 23.3 23.2 23.45  Systolic 127 150 862  Diastolic 76  82 67  Pulse 78 66 66     Physical Exam Vitals and nursing note reviewed.  Constitutional:      Appearance: Normal appearance. She is normal weight.  HENT:     Head: Normocephalic and atraumatic.   Cardiovascular:     Rate and Rhythm: Normal rate and regular rhythm.     Pulses: Normal pulses.     Heart sounds: Normal heart sounds.  Pulmonary:     Effort: Pulmonary effort is normal.     Breath sounds: Normal breath sounds.   Skin:    General: Skin is warm and dry.   Neurological:     General: No focal deficit present.     Mental Status: She is alert and oriented to person, place, and time. Mental status is at baseline.   Psychiatric:        Mood and Affect: Mood normal.        Behavior: Behavior normal.        Thought Content: Thought content normal.        Judgment: Judgment normal.     Assessment & Plan:   Encounter for Medicare annual wellness exam  Hypothyroidism, unspecified type Assessment & Plan: Followed by Endo, euthyroid on synthroid  daily. TSH 2.31   Hyperlipidemia, unspecified hyperlipidemia type Assessment & Plan: Continue Cresor 20mg  daily.  I recommend consuming a heart healthy diet such as Mediterranean diet or DASH diet with whole grains, fruits, vegetable, fish, lean meats, nuts, and olive oil. Limit sweets and processed foods. I also encourage moderate intensity exercise 150 minutes weekly. This is 3-5 times weekly for 30-50 minutes each session. Goal should be pace of 3 miles/hours, or walking 1.5 miles in 30 minutes. The ASCVD Risk score (Arnett DK, et al., 2019) failed to calculate for the following reasons:   Risk score cannot be calculated because patient has a medical history suggesting prior/existing ASCVD    Coronary artery disease involving native coronary artery of native heart without angina pectoris Assessment & Plan: Followed by Cardiology. Comorbid conditions well controlled including glucose, cholesterol, and blood pressure. No chest pain. CMP, lipids, TSH normal.   History per Cardiology: Heart catheterization August 2019: Mild CAD, reported thoracic aortic aneurysm. Coronary CTA November 2021: Total CAC 261, 90th percentile, mild nonobstructive CAD. Patient has completely stopped smoking.   Chronic obstructive pulmonary disease, unspecified COPD type (HCC) Assessment & Plan: SOB exacerbated during recent hospitalization for atrial fibrillation. Has stopped eliquis  due to SOB. Regular rate and rhythm today and she is close to her baseline respiratory status.    Chronic insomnia Assessment & Plan: Restoril  losing effectiveness, has trouble with sleep maintenance. Declines sleep apnea testing at this time due to presumed inability to tolerate CPAP, will consider. Increase Temazepam  to 22.5mg  nightly. Return to office if symptoms persist or worsen.     History of atrial fibrillation Assessment & Plan: Not taking Diltiazem  or Eliquis , aware of risks, normal rate and rhythm on exam today.    Other orders -     Temazepam ; Take 1 capsule (22.5 mg total) by mouth at bedtime as needed for sleep. TAKE 1 CAPSULE AT BEDTIME AS NEEDED  Dispense: 90 capsule; Refill: 1     Follow up plan: Return in about 6 months (around 04/04/2024) for annual physical with labs 1 week prior.  Jeoffrey GORMAN Barrio, FNP

## 2023-10-05 ENCOUNTER — Other Ambulatory Visit: Payer: Self-pay | Admitting: Family Medicine

## 2023-10-05 ENCOUNTER — Encounter: Payer: Self-pay | Admitting: Family Medicine

## 2023-10-05 MED ORDER — TEMAZEPAM 22.5 MG PO CAPS
22.5000 mg | ORAL_CAPSULE | Freq: Every evening | ORAL | 1 refills | Status: DC | PRN
Start: 2023-10-05 — End: 2023-11-02

## 2023-10-10 ENCOUNTER — Other Ambulatory Visit: Payer: Self-pay | Admitting: Family Medicine

## 2023-10-10 DIAGNOSIS — Z1211 Encounter for screening for malignant neoplasm of colon: Secondary | ICD-10-CM

## 2023-10-18 ENCOUNTER — Encounter: Payer: Self-pay | Admitting: Internal Medicine

## 2023-10-18 ENCOUNTER — Ambulatory Visit: Attending: Internal Medicine | Admitting: Internal Medicine

## 2023-10-18 VITALS — BP 133/76 | HR 80 | Ht 64.0 in | Wt 134.8 lb

## 2023-10-18 DIAGNOSIS — C3491 Malignant neoplasm of unspecified part of right bronchus or lung: Secondary | ICD-10-CM | POA: Insufficient documentation

## 2023-10-18 DIAGNOSIS — D696 Thrombocytopenia, unspecified: Secondary | ICD-10-CM | POA: Diagnosis present

## 2023-10-18 DIAGNOSIS — R0989 Other specified symptoms and signs involving the circulatory and respiratory systems: Secondary | ICD-10-CM | POA: Insufficient documentation

## 2023-10-18 DIAGNOSIS — I2510 Atherosclerotic heart disease of native coronary artery without angina pectoris: Secondary | ICD-10-CM | POA: Insufficient documentation

## 2023-10-18 NOTE — Patient Instructions (Signed)
 Medication Instructions:  The current medical regimen is effective;  continue present plan and medications.  *If you need a refill on your cardiac medications before your next appointment, please call your pharmacy*  Follow-Up: At [facility], you and your health needs are our priority.  As part of our continuing mission to provide you with exceptional heart care, our providers are all part of one team.  This team includes your primary Cardiologist (physician) and Advanced Practice Providers or APPs (Physician Assistants and Nurse Practitioners) who all work together to provide you with the care you need, when you need it.  Your next appointment:   1 year(s)  Provider:   Stanly DELENA Leavens, MD    We recommend signing up for the patient portal called MyChart.  Sign up information is provided on this After Visit Summary.  MyChart is used to connect with patients for Virtual Visits (Telemedicine).  Patients are able to view lab/test results, encounter notes, upcoming appointments, etc.  Non-urgent messages can be sent to your provider as well.   To learn more about what you can do with MyChart, go to [email or web address].

## 2023-10-18 NOTE — Progress Notes (Signed)
 Cardiology Office Note:  .    Date:  10/18/2023  ID:  Katrina Campbell, DOB Oct 29, 1955, MRN [id number] PCP: [name], FNP  [facility] Providers Cardiologist:  Stanly DELENA Leavens, MD     CC: AF follow up  History of Present Illness: .    Katrina Campbell is a 68 y.o. female seen for CAD and AF 2022: Cardio-Onc Eval; no issues; non obstructive with high dose radiation, no LV dysfunction on carboplatin  and paclitaxel  2023: Saw HP 2024: Saw ST 2025: Re-established with me, new AF  Katrina Campbell is a 68 year old female with persistent atrial fibrillation who presents for evaluation of atrial fibrillation and blood pressure management.  She has a history of squamous cell lung carcinoma treated with paclitaxel  and carboplatin , mild aortic dilation, and non-obstructive coronary disease evaluated by heart catheterization. She is on rosuvastatin  and Zetia  for cholesterol management.  She has been experiencing persistent atrial fibrillation and was started on Eliquis , which led to her feeling unwell and experiencing a significant drop in blood pressure. She discontinued Eliquis  and reports feeling better in the past couple of weeks. She is currently taking aspirin .  She experienced symptomatic hypotension while on diltiazem , which was subsequently discontinued. She also reported a nonproductive cough with blood-tinged sputum, although she has not had any more episodes of atrial fibrillation since the initial one.  Her blood pressure has been variable, generally around 120/80 mmHg with a heart rate of 65 bpm. She monitors her blood pressure periodically. She has a history of low platelets and has not experienced congestive heart failure or hypertension. She does not have diabetes or a history of stroke but has mild vascular disease.  She is actively managing her health through diet and exercise, including using a treadmill for 30 minutes daily. She has a history of lung issues, which she  believes may contribute to her coughing spells.  Discussed the use of AI scribe software for clinical note transcription with the patient, who gave verbal consent to proceed.   Relevant histories: .  Social  - Tobacco: Denies smoking - Alcohol: Denies alcohol use - Patient uses a treadmill for exercise, 30 minutes a day even when she feel bad  ROS: As per HPI.   Studies Reviewed: .     Cardiac Studies & Procedures   ______________________________________________________________________________________________ CARDIAC CATHETERIZATION  CARDIAC CATHETERIZATION 11/07/2017  Conclusion Conclusions: 1. Eccentric calcification with mild stenosis of the ostial LAD (~20%).  Otherwise, no angiographically significant coronary artery disease. 2. Normal left ventricular systolic function and filling pressure.  Recommendations: 1. Continue medical therapy and risk factor modification to prevent progression of CAD. 2. Monitor overnight to ensure the patient does not have recurrent chest pain.  Anticipate discharge home tomorrow morning.  Recommend Aspirin  81mg  daily for mild CAD and thoracic aortic aneurysm.  Katrina Hanson, MD [facility] Pager: [phone]  Findings Coronary Findings Diagnostic  Dominance: Left  Left Main Vessel is large. Vessel is angiographically normal. Short LMCA.  Left Anterior Descending Vessel is large. Ost LAD lesion is 20% stenosed. The lesion is eccentric. The lesion is calcified.  First Diagonal Branch Vessel is small in size.  Second Diagonal Branch Vessel is small in size.  Left Circumflex Vessel is large. Vessel is angiographically normal.  First Obtuse Marginal Branch Vessel is small in size.  Second Obtuse Marginal Branch Vessel is small in size.  Third Obtuse Marginal Branch Vessel is moderate in size.  Left Posterior Descending Artery Vessel is small  in size.  First Left Posterolateral Branch Vessel is small in  size.  Second Left Posterolateral Branch Vessel is small in size.  Right Coronary Artery Vessel is small. Vessel is angiographically normal.  Intervention  No interventions have been documented.   STRESS TESTS  MYOCARDIAL PERFUSION IMAGING 06/28/2016  Interpretation Summary  Nuclear stress EF: 58%.  Blood pressure demonstrated a normal response to exercise.  There was no ST segment deviation noted during stress.  Defect 1: There is a small defect of moderate severity present in the apical anterior and apex location.  The study is normal.  This is a low risk study.  The left ventricular ejection fraction is normal (55-65%).  Normal stress nuclear study with breast attenuation but no ischemia; EF 58 with normal wall motion.   ECHOCARDIOGRAM  ECHOCARDIOGRAM COMPLETE 08/11/2023  Narrative ECHOCARDIOGRAM REPORT    Patient Name:   Katrina Campbell Date of Exam: 08/11/2023 Medical Rec #:  [id number]   Height:       64.0 in Accession #:    [id number]  Weight:       137.8 lb Date of Birth:  [birth year]    BSA:          1.670 m Patient Age:    67 years    BP:           102/85 mmHg Patient Gender: F           HR:           107 bpm. Exam Location:  Inpatient  Procedure: 2D Echo, Color Doppler and Cardiac Doppler (Both Spectral and Color Flow Doppler were utilized during procedure).  Indications:    Atrial Fibrillation  History:        Patient has prior history of Echocardiogram examinations, most recent 12/10/2021. CAD; Arrythmias:Atrial Fibrillation.  Sonographer:    Philomena Daring Referring Phys: 74 ARSHAD N KAKRAKANDY  IMPRESSIONS   1. Patient appears to be in rapid afib during study. 2. Left ventricular ejection fraction, by estimation, is 55 to 60%. The left ventricle has normal function. The left ventricle has no regional wall motion abnormalities. Left ventricular diastolic parameters are indeterminate. 3. Right ventricular systolic function is normal. The right  ventricular size is normal. 4. A small pericardial effusion is present. The pericardial effusion is anterior to the right ventricle. 5. The mitral valve is abnormal. Trivial mitral valve regurgitation. No evidence of mitral stenosis. 6. The aortic valve is tricuspid. There is mild calcification of the aortic valve. There is mild thickening of the aortic valve. Aortic valve regurgitation is not visualized. Aortic valve sclerosis is present, with no evidence of aortic valve stenosis. 7. The inferior vena cava is normal in size with greater than 50% respiratory variability, suggesting right atrial pressure of 3 mmHg.  FINDINGS Left Ventricle: Left ventricular ejection fraction, by estimation, is 55 to 60%. The left ventricle has normal function. The left ventricle has no regional wall motion abnormalities. Strain was performed and the global longitudinal strain is indeterminate. The left ventricular internal cavity size was normal in size. There is no left ventricular hypertrophy. Left ventricular diastolic parameters are indeterminate.  Right Ventricle: The right ventricular size is normal. No increase in right ventricular wall thickness. Right ventricular systolic function is normal.  Left Atrium: Left atrial size was normal in size.  Right Atrium: Right atrial size was normal in size.  Pericardium: A small pericardial effusion is present. The pericardial effusion is anterior to the right ventricle.  Mitral  Valve: The mitral valve is abnormal. There is mild thickening of the mitral valve leaflet(s). There is mild calcification of the mitral valve leaflet(s). Mild mitral annular calcification. Trivial mitral valve regurgitation. No evidence of mitral valve stenosis.  Tricuspid Valve: The tricuspid valve is normal in structure. Tricuspid valve regurgitation is mild . No evidence of tricuspid stenosis.  Aortic Valve: The aortic valve is tricuspid. There is mild calcification of the aortic valve.  There is mild thickening of the aortic valve. Aortic valve regurgitation is not visualized. Aortic valve sclerosis is present, with no evidence of aortic valve stenosis.  Pulmonic Valve: The pulmonic valve was normal in structure. Pulmonic valve regurgitation is trivial. No evidence of pulmonic stenosis.  Aorta: The aortic root is normal in size and structure.  Venous: The inferior vena cava is normal in size with greater than 50% respiratory variability, suggesting right atrial pressure of 3 mmHg.  IAS/Shunts: No atrial level shunt detected by color flow Doppler.  Additional Comments: Patient appears to be in rapid afib during study. 3D was performed not requiring image post processing on an independent workstation and was indeterminate.   LEFT VENTRICLE PLAX 2D LVIDd:         4.10 cm LVIDs:         3.00 cm LV PW:         1.00 cm LV IVS:        1.00 cm LVOT diam:     2.10 cm LV SV:         58 LV SV Index:   35 LVOT Area:     3.46 cm   RIGHT VENTRICLE             IVC RV S prime:     16.00 cm/s  IVC diam: 1.90 cm TAPSE (M-mode): 2.0 cm  LEFT ATRIUM             Index        RIGHT ATRIUM           Index LA Vol (A2C):   43.5 ml 26.05 ml/m  RA Area:     16.60 cm LA Vol (A4C):   20.3 ml 12.16 ml/m  RA Volume:   45.00 ml  26.95 ml/m LA Biplane Vol: 32.5 ml 19.46 ml/m AORTIC VALVE LVOT Vmax:   103.00 cm/s LVOT Vmean:  60.300 cm/s LVOT VTI:    0.167 m  AORTA Ao Root diam: 3.10 cm Ao Asc diam:  3.50 cm   SHUNTS Systemic VTI:  0.17 m Systemic Diam: 2.10 cm  Maude Emmer MD Electronically signed by Maude Emmer MD Signature Date/Time: 08/11/2023/1:41:22 PM    Final    MONITORS  LONG TERM MONITOR (3-14 DAYS) 09/12/2023  Narrative   Patient had a minimum heart rate of 50 bpm, maximum heart rate of 203 bpm, and average heart rate of 74 bpm. Predominant underlying rhythm was sinus rhythm. Frequent short bursts of SVT, longest 11 seconds. No atrial  fibrillation. Isolated PACs were rare (<1.0%). Isolated PVCs were rare (<1.0%). Triggered and diary events associated with sinus rhythm and sinus tachycardia.  No atrial fibrillation.   CT SCANS  CT CORONARY MORPH W/CTA COR W/SCORE 03/03/2020  Addendum 03/03/2020  1:37 PM ADDENDUM REPORT: 03/03/2020 13:35  CLINICAL DATA:  68 year old female with h/o hypertension, hyperlipidemia, known non-obstructive CAD on cath in 2019 and dyspnea on exertion.  EXAM: Cardiac/Coronary  CTA  TECHNIQUE: The patient was scanned on a [employer].  FINDINGS: A 100 kV  prospective scan was triggered in the descending thoracic aorta at 111 HU's. Axial non-contrast 3 mm slices were carried out through the heart. The data set was analyzed on a dedicated work station and scored using the Agatson method. Gantry rotation speed was 250 msecs and collimation was .6 mm. Metoprolol  100 mg PO and 0.8 mg of sl NTG was given. The 3D data set was reconstructed in 5% intervals of the 67-82 % of the R-R cycle. Diastolic phases were analyzed on a dedicated work station using MPR, MIP and VRT modes. The patient received 80 cc of contrast.  Aorta: Normal size. Mild diffuse atherosclerotic disease and calcifications. No dissection.  Aortic Valve:  Trileaflet.  No calcifications.  Coronary Arteries:  Normal coronary origin.  Left dominance.  RCA is a small non-dominant artery with luminal irregularities.  Left main is a large artery that gives rise to LAD and LCX arteries. Distal left main has mild to moderate eccentric calcified plaque with stenosis 25-49%.  LAD is a large vessel that gives rise to two diagonal arteries. There is mild calcified plaque in the proximal LAD with stenosis 25-49%. Mid and distal LAD has minimal plaque.  D1,2 have only minimal irregularities.  LCX is a large dominant artery that gives rise to PDA and PLA. There is mild calcified plaque in the proximal and mid LCX  artery with stenosis 25-49%, otherwise only minimal non-calcified plaque.  Other findings:  Normal pulmonary vein drainage into the left atrium.  Normal left atrial appendage without a thrombus.  Normal size of the pulmonary artery.  IMPRESSION: 1. Coronary calcium  score of 261. This was 90 percentile for age and sex matched control.  2. Normal coronary origin with left dominance.  3. CAD-RADS 2. Mild non-obstructive CAD (25-49%) in the distal left main and proximal portions of LAD and LCX arteries. Consider non-atherosclerotic causes of dyspnea. Consider preventive therapy and risk factor modification.   Electronically Signed By: Leim Moose On: 03/03/2020 13:35  Narrative EXAM: OVER-READ INTERPRETATION  CT CHEST  The following report is an over-read performed by radiologist Dr. Toribio Aye of [facility] on 03/03/2020. This over-read does not include interpretation of cardiac or coronary anatomy or pathology. The coronary calcium  score/coronary CTA interpretation by the cardiologist is attached.  COMPARISON:  None.  FINDINGS: Aortic atherosclerosis. Mild emphysema. Within the visualized portions of the thorax there are no suspicious appearing pulmonary nodules or masses, there is no acute consolidative airspace disease, no pleural effusions, no pneumothorax and no lymphadenopathy. Visualized portions of the upper abdomen are unremarkable. There are no aggressive appearing lytic or blastic lesions noted in the visualized portions of the skeleton.  IMPRESSION: 1. Aortic Atherosclerosis (ICD10-I70.0) and Emphysema (ICD10-J43.9).  Electronically Signed: By: Toribio Aye M.D. On: 03/03/2020 10:23     ______________________________________________________________________________________________      Physical Exam:    VS:  BP 133/76 (BP Location: Left Arm, Patient Position: Sitting, Cuff Size: Normal)   Pulse 80   Ht 5' 4 (1.626 m)    Wt 134 lb 12.8 oz (61.1 kg)   SpO2 96%   BMI 23.14 kg/m    Wt Readings from Last 3 Encounters:  10/18/23 134 lb 12.8 oz (61.1 kg)  10/02/23 135 lb 12.8 oz (61.6 kg)  09/26/23 135 lb 3.2 oz (61.3 kg)    Gen: no distress   Neck: No JVD  Cardiac: No Rubs or Gallops, Systolic crescendo murmur, RRR +2 radial pulses Respiratory: Clear to auscultation bilaterally, normal effort, normal  respiratory rate GI: Soft, nontender, non-distended  MS: No  edema;  moves all extremities Integument: Skin feels warm Neuro:  At time of evaluation, alert and oriented to person/place/time/situation  Psych: Normal affect, patient feels ok   ASSESSMENT AND PLAN: .    CAD HLD Aortic atherosclerosis Non-obstructive coronary artery disease with mild vascular disease noted on previous heart catheterization. Current management includes cholesterol control with rosuvastatin  and Zetia . - Continue rosuvastatin  and Zetia  for cholesterol management.  Continue robust exercise program  Mild aortic dilation Mild aortic dilation noted with small pericardial effusion - we may repeat echocardiogram in 2026; has trivial MR as well  Symptomatic hypotension Previous symptomatic hypotension in the setting of diltiazem  use, which has been discontinued. Current blood pressure readings are within acceptable range with some variability.  Persistent atrial fibrillation Squamous cell lung carcinoma and subsequent thrombocytopenia Persistent atrial fibrillation with previous symptomatic episode. Risks of stroke and potential for blood clot formation in the left atrial appendage were discussed. She has experienced adverse effects from Eliquis  and prefers to avoid blood thinners if possible. Risk of stroke is approximately 3.2% per year without anticoagulation HASBLED of 2. Risk of bleeding on anticoagulation is about 1.9 bleeds per 100 patient years.  My preference is for her to resume OAC; she has decline. - Monitor with smart  watch for signs of atrial fibrillation. - Consider implantable loop recorder for long-term monitoring. - If atrial fibrillation is detected, initiate short-term anticoagulation and refer for left atrial appendage closure procedure. - Continue aspirin  therapy. - Educate on recognizing symptoms of atrial fibrillation and when to seek medical attention.  Stanly Leavens, MD FASE [facility] Cardiologist [facility] [address]  [address] [place], [address] [place], [place] [address] [phone]  10:56 AM

## 2023-10-28 LAB — COLOGUARD: COLOGUARD: NEGATIVE

## 2023-10-31 ENCOUNTER — Encounter: Payer: Self-pay | Admitting: Family Medicine

## 2023-11-02 ENCOUNTER — Other Ambulatory Visit: Payer: Self-pay | Admitting: Family Medicine

## 2023-11-02 MED ORDER — TEMAZEPAM 15 MG PO CAPS
15.0000 mg | ORAL_CAPSULE | Freq: Every evening | ORAL | 0 refills | Status: DC | PRN
Start: 2023-11-02 — End: 2023-11-06

## 2023-11-03 ENCOUNTER — Other Ambulatory Visit: Payer: Self-pay | Admitting: Family Medicine

## 2023-11-06 ENCOUNTER — Other Ambulatory Visit: Payer: Self-pay | Admitting: Family Medicine

## 2023-11-06 MED ORDER — TEMAZEPAM 15 MG PO CAPS: 15.0000 mg | ORAL_CAPSULE | Freq: Every evening | ORAL | 0 refills | Status: DC | PRN

## 2023-11-09 ENCOUNTER — Ambulatory Visit (INDEPENDENT_AMBULATORY_CARE_PROVIDER_SITE_OTHER): Admitting: Emergency Medicine

## 2023-11-09 ENCOUNTER — Encounter: Payer: Self-pay | Admitting: Emergency Medicine

## 2023-11-09 VITALS — BP 153/89 | HR 67 | Temp 98.1°F | Ht 64.0 in | Wt 134.6 lb

## 2023-11-09 DIAGNOSIS — J42 Unspecified chronic bronchitis: Secondary | ICD-10-CM

## 2023-11-09 DIAGNOSIS — J449 Chronic obstructive pulmonary disease, unspecified: Secondary | ICD-10-CM

## 2023-11-09 DIAGNOSIS — J479 Bronchiectasis, uncomplicated: Secondary | ICD-10-CM | POA: Diagnosis not present

## 2023-11-09 DIAGNOSIS — C3491 Malignant neoplasm of unspecified part of right bronchus or lung: Secondary | ICD-10-CM | POA: Diagnosis not present

## 2023-11-09 MED ORDER — ALBUTEROL SULFATE HFA 108 (90 BASE) MCG/ACT IN AERS: 2.0000 | INHALATION_SPRAY | Freq: Four times a day (QID) | RESPIRATORY_TRACT | 1 refills | Status: DC | PRN

## 2023-11-09 MED ORDER — ALBUTEROL SULFATE (2.5 MG/3ML) 0.083% IN NEBU: 2.5000 mg | INHALATION_SOLUTION | Freq: Four times a day (QID) | RESPIRATORY_TRACT | 7 refills | Status: DC | PRN

## 2023-11-09 MED ORDER — TRELEGY ELLIPTA 100-62.5-25 MCG/ACT IN AEPB: INHALATION_SPRAY | RESPIRATORY_TRACT | 3 refills | Status: DC

## 2023-11-09 NOTE — Assessment & Plan Note (Signed)
Continue your Trelegy 1 inhalation once daily.  Rinse and gargle after using ?Keep albuterol available to use 2 puffs if needed for shortness of breath, chest tightness, wheezing. ?

## 2023-11-09 NOTE — Patient Instructions (Signed)
 We reviewed your CT scan of the chest from June 2025. Get your repeat CT as planned in December 2025. Continue your Trelegy 1 inhalation once daily.  Rinse and gargle after using. Keep albuterol  available to use 2 puffs if needed for shortness of breath, chest tightness, wheezing. Restart your chest vest treatments twice a day Restart your saline nebulizer treatments once a day We will perform sputum culture. Follow with Dr. Shelah in December so we can review how you are doing and review your CT scan of the chest.  At that time we will decide whether any other investigation of your cough and mucus would be helpful.

## 2023-11-09 NOTE — Assessment & Plan Note (Signed)
 Restart your chest vest treatments twice a day Restart your saline nebulizer treatments once a day We will perform sputum culture. Follow with Dr. Shelah in December so we can review how you are doing and review your CT scan of the chest.  At that time we will decide whether any other investigation of your cough and mucus would be helpful.

## 2023-11-09 NOTE — Assessment & Plan Note (Signed)
 We reviewed your CT scan of the chest from June 2025. Get your repeat CT as planned in December 2025.

## 2023-11-09 NOTE — Progress Notes (Signed)
   Subjective:    Patient ID: Katrina Campbell, female    DOB: [birth year], 68 y.o.   MRN: [id number]  HPI  ROV 11/09/2023 --Ms. Katrina Campbell is a 12 with history of former tobacco use and associated COPD.  Mycobacterium avium (treated), Bjerkandera adusta, with associated right upper lobe atelectasis, stage IIIa squamous cell lung cancer treated with chemotherapy, XRT and then Imfinzi , currently on observation.  She was seen in May after hospitalization for atrial fibrillation for which she was started on Eliquis .  She had hemoptysis. She is now off the Eliquis  since early July, is following with Cardiology. She was treated with antibiotics for possible bronchitis. Then was treated w azithro following her CT chest. She feels that she is still congested, greenish mucous. She last saw blood 3 weeks ago.  Currently managed on Trelegy, has albuterol   CT scan of the chest 09/11/2023 reviewed by me, shows radiation changes in the right upper lobe with some associated scarring volume loss.  No evidence of metastatic disease or local recurrence.  There was some mild multifocal subpleural patchy opacity in the bilateral lower lobes, either inflammatory or infectious.   Review of Systems As per HPI  Past Medical History:  Diagnosis Date   Anxiety    COPD (chronic obstructive pulmonary disease) (HCC)    Coronary artery disease    Dental staining 07/15/2019   Depression    History of radiation therapy    Right lung- 11/03/20-12/15/20- Dr. Lynwood Nasuti   Hyperlipidemia    Labile hypertension    Myocardial infarction [facility]) 2018   Pneumonia    Pulmonary mycobacterial infection (HCC) 10/24/2018        Objective:   Physical Exam Vitals:   11/09/23 0919  BP: (!) 153/89  Pulse: 67  Temp: 98.1 F (36.7 C)  TempSrc: Temporal  SpO2: 97%  Weight: 134 lb 9.6 oz (61.1 kg)  Height: 5' 4 (1.626 m)   Gen: Pleasant, well-nourished, in no distress,  normal affect  ENT: No lesions,  mouth clear,  oropharynx clear, no  postnasal drip  Neck: No JVD, no stridor  Lungs: No use of accessory muscles, coarse bilaterally, few scattered rhonchi, end expiratory wheeze, no crackles  Cardiovascular: RRR, heart sounds normal, no murmur or gallops, no peripheral edema  Musculoskeletal: No deformities, no cyanosis or clubbing  Neuro: alert, awake, non focal  Skin: Warm, no lesions or rash      Assessment & Plan:  Bronchiectasis without complication (HCC) Restart your chest vest treatments twice a day Restart your saline nebulizer treatments once a day We will perform sputum culture. Follow with Dr. Shelah in December so we can review how you are doing and review your CT scan of the chest.  At that time we will decide whether any other investigation of your cough and mucus would be helpful.  COPD (chronic obstructive pulmonary disease) (HCC) Continue your Trelegy 1 inhalation once daily.  Rinse and gargle after using. Keep albuterol  available to use 2 puffs if needed for shortness of breath, chest tightness, wheezing.  Stage III squamous cell carcinoma of right lung [facility]) We reviewed your CT scan of the chest from June 2025. Get your repeat CT as planned in December 2025.    Lamar Shelah, MD, PhD 11/09/2023, 4:50 PM [facility] [phone] or if no answer before 7:00PM call [phone] For any issues after 7:00PM please call eLink [phone]

## 2023-11-10 ENCOUNTER — Other Ambulatory Visit

## 2023-11-13 LAB — RESPIRATORY CULTURE OR RESPIRATORY AND SPUTUM CULTURE
MICRO NUMBER:: 16806467
SPECIMEN QUALITY:: ADEQUATE

## 2023-11-14 LAB — HM DEXA SCAN

## 2023-11-14 LAB — HM MAMMOGRAPHY

## 2023-11-15 ENCOUNTER — Telehealth: Payer: Self-pay

## 2023-11-15 NOTE — Telephone Encounter (Signed)
 Copied from CRM [id number]. Topic: Clinical - Lab/Test Results >> Nov 15, 2023  2:07 PM Joesph PARAS wrote: Reason for CRM: Patient is calling in to request that somebody call her immediately regarding her sputum test results. She wished to schedule an appointment but first available is in October and patient has results  now but needs someone to explain to her what they mean. She interprets that her results are fairly serious and feels she is being left out to dry by her provider. Please call patient with results.    ----------------------------------------------------------------------- From previous Reason for Contact - Scheduling: Patient/patient representative is calling to schedule an appointment. Refer to attachments for appointment information.  I called and spoke to pt. Pt states she got her results back on Monday and she wanted to know why the doctor is not calling her with results. I informed pt that Dr Shelah has not reviewed the Respiratory results and he has not received the AFB yet. I informed pt that these tests were just done and pts receive results in Mychart faster than to doctors do. Pt verbalized understanding. Dr Shelah Please review sputum results when you can.

## 2023-11-17 NOTE — Telephone Encounter (Signed)
 I spoke with the patient to review her sputum results.  The AFB is still pending.  The bacterial culture showed haemophilus species, unclear clinical significance although suspect this is a colonizer given the stability in her cough burden, sputum burden.  We discussed the pros and cons of treating with antibiotics and we agreed to hold off for now.  We also discussed her getting back on her chest vest.  She was concerned that this may negatively impact her atrial fibrillation.  I do not think there should be a connection between the 2 but if she notices tachycardia then we can reconsider.

## 2023-11-18 ENCOUNTER — Encounter: Payer: Self-pay | Admitting: Family Medicine

## 2023-11-20 ENCOUNTER — Other Ambulatory Visit: Payer: Self-pay | Admitting: Family Medicine

## 2023-11-20 MED ORDER — TEMAZEPAM 15 MG PO CAPS: 15.0000 mg | ORAL_CAPSULE | Freq: Every evening | ORAL | 0 refills | Status: DC | PRN

## 2023-11-21 ENCOUNTER — Ambulatory Visit: Payer: Self-pay | Admitting: Family Medicine

## 2023-12-01 LAB — AFB IDENTIFICATION BY PCR
M avium complex: POSITIVE — AB
M tuberculosis complex: NEGATIVE

## 2023-12-01 LAB — AFB CULTURE WITH SMEAR (NOT AT ARMC)
Acid Fast Culture: POSITIVE — AB
Acid Fast Smear: NEGATIVE

## 2023-12-12 ENCOUNTER — Encounter: Payer: Self-pay | Admitting: Emergency Medicine

## 2023-12-13 NOTE — Telephone Encounter (Signed)
 Patient is scheduled.NFN

## 2023-12-16 ENCOUNTER — Ambulatory Visit
Admission: RE | Admit: 2023-12-16 | Discharge: 2023-12-16 | Disposition: A | Discharge status: Home / Self Care | Source: Ambulatory Visit | Attending: Family Medicine | Admitting: Family Medicine

## 2023-12-16 DIAGNOSIS — M5412 Radiculopathy, cervical region: Secondary | ICD-10-CM

## 2023-12-16 DIAGNOSIS — M5030 Other cervical disc degeneration, unspecified cervical region: Secondary | ICD-10-CM

## 2023-12-19 ENCOUNTER — Ambulatory Visit: Payer: Self-pay | Admitting: Family Medicine

## 2023-12-20 ENCOUNTER — Other Ambulatory Visit: Payer: Self-pay

## 2023-12-20 DIAGNOSIS — M5030 Other cervical disc degeneration, unspecified cervical region: Secondary | ICD-10-CM

## 2023-12-28 ENCOUNTER — Encounter: Payer: Self-pay | Admitting: Emergency Medicine

## 2023-12-28 ENCOUNTER — Ambulatory Visit (INDEPENDENT_AMBULATORY_CARE_PROVIDER_SITE_OTHER): Admitting: Emergency Medicine

## 2023-12-28 VITALS — BP 128/70 | HR 70 | Temp 98.0°F | Ht 64.0 in | Wt 135.6 lb

## 2023-12-28 DIAGNOSIS — C3491 Malignant neoplasm of unspecified part of right bronchus or lung: Secondary | ICD-10-CM

## 2023-12-28 DIAGNOSIS — J479 Bronchiectasis, uncomplicated: Secondary | ICD-10-CM

## 2023-12-28 DIAGNOSIS — Z9189 Other specified personal risk factors, not elsewhere classified: Secondary | ICD-10-CM | POA: Diagnosis not present

## 2023-12-28 DIAGNOSIS — J42 Unspecified chronic bronchitis: Secondary | ICD-10-CM

## 2023-12-28 DIAGNOSIS — R29818 Other symptoms and signs involving the nervous system: Secondary | ICD-10-CM

## 2023-12-28 DIAGNOSIS — J449 Chronic obstructive pulmonary disease, unspecified: Secondary | ICD-10-CM | POA: Diagnosis not present

## 2023-12-28 MED ORDER — TRELEGY ELLIPTA 100-62.5-25 MCG/ACT IN AEPB: INHALATION_SPRAY | RESPIRATORY_TRACT | 3 refills | Status: DC

## 2023-12-28 MED ORDER — ALBUTEROL SULFATE (2.5 MG/3ML) 0.083% IN NEBU: 2.5000 mg | INHALATION_SOLUTION | Freq: Four times a day (QID) | RESPIRATORY_TRACT | 7 refills | Status: AC | PRN

## 2023-12-28 MED ORDER — ALBUTEROL SULFATE HFA 108 (90 BASE) MCG/ACT IN AERS: 2.0000 | INHALATION_SPRAY | Freq: Four times a day (QID) | RESPIRATORY_TRACT | 1 refills | Status: AC | PRN

## 2023-12-28 NOTE — Patient Instructions (Addendum)
 We reviewed your sputum culture results today.  The sample from 11/10/2023 shows that you are still colonized with Mycobacterium avium. There is no compelling reason for us  to have to start antibiotic treatment for Mycobacterium at this time.  We will continue to follow your chest imaging and your symptoms. Get your CT scan of the chest in December as planned by Dr. Sherrod. Continue your inhaled medications as you have been taking them. We will arrange for home sleep test and review at next time Follow Dr. Shelah in December after your CT chest so we can review.

## 2023-12-28 NOTE — Assessment & Plan Note (Signed)
 Next scan scheduled for December.  She is currently on observation.  Following with Dr. Sherrod.

## 2023-12-28 NOTE — Assessment & Plan Note (Signed)
 She snores, has seen desaturations.  Moderate suspicion for OSA.  Discussed the options and we have decided to perform a home sleep test.  We can review next time.

## 2023-12-28 NOTE — Assessment & Plan Note (Signed)
 She was having cough, hemoptysis after she started Eliquis  for her A-fib.  These have both resolved off the Eliquis .  Her sputum culture from 11/10/2023 confirmed that she still colonized with Mycobacterium avium.  No evidence of significant sequela of this on her CT chest from June.  Her next scan is planned for December.  We reviewed your sputum culture results today.  The sample from 11/10/2023 shows that you are still colonized with Mycobacterium avium. There is no compelling reason for us  to have to start antibiotic treatment for Mycobacterium at this time.  We will continue to follow your chest imaging and your symptoms. Follow Dr. Shelah in December after your CT chest so we can review.

## 2023-12-28 NOTE — Assessment & Plan Note (Signed)
 Plan to continue her same inhaler regimen.  Refills were sent today.

## 2023-12-28 NOTE — Progress Notes (Signed)
 Subjective:    Patient ID: Katrina Campbell, female    DOB: 1955/08/26, 68 y.o.   MRN: [id number]  HPI  ROV 11/09/2023 --Katrina Campbell is a 68 with history of former tobacco use and associated COPD.  Mycobacterium avium (treated), Bjerkandera adusta, with associated right upper lobe atelectasis, stage IIIa squamous cell lung cancer treated with chemotherapy, XRT and then Imfinzi , currently on observation.  She was seen in May after hospitalization for atrial fibrillation for which she was started on Eliquis .  She had hemoptysis. She is now off the Eliquis  since early July, is following with Cardiology. She was treated with antibiotics for possible bronchitis. Then was treated w azithro following her CT chest. She feels that she is still congested, greenish mucous. She last saw blood 3 weeks ago.  Currently managed on Trelegy, has albuterol   CT scan of the chest 09/11/2023 reviewed by me, shows radiation changes in the right upper lobe with some associated scarring volume loss.  No evidence of metastatic disease or local recurrence.  There was some mild multifocal subpleural patchy opacity in the bilateral lower lobes, either inflammatory or infectious.  ROV 12/28/2023 --Katrina Campbell is 68 with a history of COPD, bronchiectasis (previously treated [facility] 28mo, prior culture with Bjerkandera adusta), stage IIIa squamous cell lung cancer that was treated by chemoradiation, then Imfinzi .  She is currently on observation.  Also with history of atrial fibrillation.  She had hemoptysis with Eliquis  and it was stopped.  We repeated a sputum culture on 11/10/2023 that is positive for Mycobacterium avium and Haemophilus paraphrophilus.  Her next chest imaging has been planned for December with Dr. Sherrod. She is no longer coughing, no hemoptysis. She is off Eliquis  now. She is not having any constitutional sx, no fevers, chills, wt loss, weakness. Some occasional fatigue in the evenings. She mentions that she has some desats noted on her  watch. She wonders about OSA, and she does snore. She feels well rested usually.    Review of Systems As per HPI  Past Medical History:  Diagnosis Date   Anxiety    COPD (chronic obstructive pulmonary disease) (HCC)    Coronary artery disease    Dental staining 07/15/2019   Depression    History of radiation therapy    Right lung- 11/03/20-12/15/20- Dr. Lynwood Nasuti   Hyperlipidemia    Labile hypertension    Myocardial infarction [facility]) 2018   Pneumonia    Pulmonary mycobacterial infection (HCC) 10/24/2018        Objective:   Physical Exam Vitals:   12/28/23 1040  BP: 128/70  Pulse: 70  Temp: 98 F (36.7 C)  TempSrc: Temporal  SpO2: 97%  Weight: 135 lb 9.6 oz (61.5 kg)  Height: 5' 4 (1.626 m)   Gen: Pleasant, well-nourished, in no distress,  normal affect  ENT: No lesions,  mouth clear,  oropharynx clear, no postnasal drip  Neck: No JVD, no stridor  Lungs: No use of accessory muscles, coarse bilaterally, few scattered rhonchi, end expiratory wheeze, no crackles  Cardiovascular: RRR, heart sounds normal, no murmur or gallops, no peripheral edema  Musculoskeletal: No deformities, no cyanosis or clubbing  Neuro: alert, awake, non focal  Skin: Warm, no lesions or rash      Assessment & Plan:  Bronchiectasis without complication (HCC) She was having cough, hemoptysis after she started Eliquis  for her A-fib.  These have both resolved off the Eliquis .  Her sputum culture from 11/10/2023 confirmed that she still colonized with Mycobacterium avium.  No evidence of significant sequela of this on her CT chest from June.  Her next scan is planned for December.  We reviewed your sputum culture results today.  The sample from 11/10/2023 shows that you are still colonized with Mycobacterium avium. There is no compelling reason for us  to have to start antibiotic treatment for Mycobacterium at this time.  We will continue to follow your chest imaging and your symptoms. Follow  Dr. Shelah in December after your CT chest so we can review.  COPD (chronic obstructive pulmonary disease) (HCC) Plan to continue her same inhaler regimen.  Refills were sent today.  Stage III squamous cell carcinoma of right lung [facility]) Next scan scheduled for December.  She is currently on observation.  Following with Dr. Sherrod.  Suspected sleep apnea She snores, has seen desaturations.  Moderate suspicion for OSA.  Discussed the options and we have decided to perform a home sleep test.  We can review next time.     Katrina Shelah, MD, PhD 12/28/2023, 11:07 AM [facility] [phone] or if no answer before 7:00PM call [phone] For any issues after 7:00PM please call eLink [phone]

## 2024-01-04 ENCOUNTER — Encounter

## 2024-01-04 DIAGNOSIS — Z9189 Other specified personal risk factors, not elsewhere classified: Secondary | ICD-10-CM

## 2024-01-17 ENCOUNTER — Telehealth: Payer: Self-pay

## 2024-01-17 DIAGNOSIS — G4733 Obstructive sleep apnea (adult) (pediatric): Secondary | ICD-10-CM | POA: Diagnosis not present

## 2024-01-17 NOTE — Telephone Encounter (Signed)
 Ms Eke called in to discuss her HR, which has been in the 40s and 66s, most recently 52 bpm. This was discovered on her smart watch. Her BP has been normal, most recently 117/72. She is otherwise asymptomatic. She is not on AV nodal blockers based on chart review. I let he know that I'll send a note to her OP cardiologist so she can be evaluated with an EKG and that she should call back or go to the ED should her BP or symptoms change.

## 2024-01-18 ENCOUNTER — Telehealth: Payer: Self-pay | Admitting: Internal Medicine

## 2024-01-18 ENCOUNTER — Ambulatory Visit (HOSPITAL_COMMUNITY)
Admission: RE | Admit: 2024-01-18 | Discharge: 2024-01-18 | Disposition: A | Discharge status: Home / Self Care | Source: Ambulatory Visit | Attending: Physician Assistant | Admitting: Physician Assistant

## 2024-01-18 VITALS — BP 150/80 | HR 58 | Ht 64.0 in | Wt 134.2 lb

## 2024-01-18 DIAGNOSIS — D6869 Other thrombophilia: Secondary | ICD-10-CM | POA: Insufficient documentation

## 2024-01-18 DIAGNOSIS — I4891 Unspecified atrial fibrillation: Secondary | ICD-10-CM | POA: Diagnosis present

## 2024-01-18 DIAGNOSIS — I480 Paroxysmal atrial fibrillation: Secondary | ICD-10-CM | POA: Diagnosis not present

## 2024-01-18 NOTE — Telephone Encounter (Signed)
 Spoke with pt who complains of bradycardia and shortness of breath.  She states she contacted the provider on call last night due to low heart rate.  She feels she may possibly be in Afib and Dr Marty suggested she have an EKG to confirm.  Pt reports she had a cortisone shot on Tuesday for shoulder pain.  Denies current CP or dizziness. Appointment scheduled with [facility] today 01/18/2024 at 130pm.  Pt verbalizes understanding and agrees with current plan.

## 2024-01-18 NOTE — Progress Notes (Signed)
 Primary Care Physician: [name], FNP Primary Cardiologist: Stanly DELENA Leavens, MD Electrophysiologist: None  Referring Physician: Dr. Leavens Katrina Campbell is a 68 y.o. female with a history of HLD, HTN, hypothyroidism, stage III squamous cell carcinoma of right lung, GERD, COPD, aortic atherosclerosis, nonobstructive CAD, thoracic aortic aneurysm, and atrial fibrillation who presents for follow up in the [facility]. Hospital admission 5/8-10/25 for Afib with RVR; she converted to NSR. Dr. JAYSON noted if further episodes to consider amiodarone as bridge to ablation. Discharged on diltiazem  120 mg daily. Patient was started on Eliquis  5 mg BID for stroke prevention.  On evaluation today, she is currently in NSR. Diltiazem  held 5/19 due to patient calling office noting orthostatic hypotension. She still feels tired since hospital discharge. She has not felt any palpitations since discharge. She is wearing monitor. No missed doses of Eliquis .  On follow up 09/26/23, she is currently in NSR. Patient has noted decreased oxygen  levels, low energy, and decreased platelets since beginning Eliquis . She is interested in stopping medication. Reassuring cardiac monitor 08/2023 showing predominantly sinus rhythm.   Follow up 01/18/24. Patient returns for follow up for atrial fibrillation and bradycardia. She has noticed her heart rates have been in the 40s-50s bpm at night. She walks on the treadmill regularly and her heart rate rises to 110's. She is not currently on anticoagulation. Her watch has not alerted for afib.   Today, she  denies symptoms of palpitations, chest pain, shortness of breath, orthopnea, PND, lower extremity edema, dizziness, presyncope, syncope, snoring, daytime somnolence, bleeding, or neurologic sequela. The patient is tolerating medications without difficulties and is otherwise without complaint today.    she has a BMI of Body mass index  is 23.04 kg/m.[name] Filed Weights   01/18/24 1308  Weight: 60.9 kg    Current Outpatient Medications  Medication Sig Dispense Refill   acetaminophen  (TYLENOL ) 500 MG tablet Take 500 mg by mouth as needed for moderate pain (pain score 4-6) or headache.     albuterol  (PROVENTIL ) (2.5 MG/3ML) 0.083% nebulizer solution Take 3 mLs (2.5 mg total) by nebulization every 6 (six) hours as needed for wheezing or shortness of breath. 540 mL 7   albuterol  (VENTOLIN  HFA) 108 (90 Base) MCG/ACT inhaler Inhale 2 puffs into the lungs every 6 (six) hours as needed for wheezing or shortness of breath. 18 g 1   Calcium  Citrate-Vitamin D  315-5 MG-MCG TABS Take 1 tablet by mouth every morning.     esomeprazole  (NEXIUM ) 40 MG capsule TAKE 1 CAPSULE DAILY 90 capsule 3   ezetimibe  (ZETIA ) 10 MG tablet Take 1 tablet (10 mg total) by mouth daily. 90 tablet 3   ferrous sulfate  325 (65 FE) MG tablet Take 325 mg by mouth every other day.     Fluticasone -Umeclidin-Vilant (TRELEGY ELLIPTA ) 100-62.5-25 MCG/ACT AEPB USE 1 INHALATION DAILY 180 each 3   Guaifenesin  (MUCINEX  MAXIMUM STRENGTH) 1200 MG TB12 Take 1,200 mg by mouth daily.     levothyroxine  (SYNTHROID ) 88 MCG tablet TAKE 1 TABLET(88 MCG) BY MOUTH DAILY 30 tablet 2   Multiple Vitamins-Minerals (IMMUNE SUPPORT PO) Take 1 capsule by mouth daily.     predniSONE  (STERAPRED UNI-PAK 21 TAB) 5 MG (21) TBPK tablet Take 5 mg by mouth as directed. On a taper     rosuvastatin  (CRESTOR ) 20 MG tablet Take 1 tablet (20 mg total) by mouth daily. Must keep scheduled appointment for future refills. Thank you. 90 tablet 3  temazepam  (RESTORIL ) 15 MG capsule Take 1 capsule (15 mg total) by mouth at bedtime as needed for sleep. TAKE 1 CAPSULE AT BEDTIME AS NEEDED 90 capsule 0   vitamin B-12 (CYANOCOBALAMIN ) 1000 MCG tablet Take 1,000 mcg by mouth daily.     No current facility-administered medications for this encounter.   Facility-Administered Medications Ordered in Other Encounters   Medication Dose Route Frequency Provider Last Rate Last Admin   Sonafine emulsion 1 application  1 application  Topical Once Shannon Agent, MD        Atrial Fibrillation Management history:  Previous antiarrhythmic drugs: none Previous cardioversions: none Previous ablations: none Anticoagulation history: Eliquis    ROS- All systems are reviewed and negative except as per the HPI above.  Physical Exam: BP (!) 150/80   Pulse (!) 58   Ht 5' 4 (1.626 m)   Wt 60.9 kg   BMI 23.04 kg/m   GEN: Well nourished, well developed in no acute distress CARDIAC: Regular rate and rhythm, no murmurs, rubs, gallops RESPIRATORY:  Clear to auscultation without rales, wheezing or rhonchi  ABDOMEN: Soft, non-tender, non-distended EXTREMITIES:  No edema; No deformity    EKG today demonstrates  SB Vent. rate 58 BPM PR interval 136 ms QRS duration 74 ms QT/QTcB 456/447 ms   Echo 08/11/23 demonstrated   1. Patient appears to be in rapid afib during study.   2. Left ventricular ejection fraction, by estimation, is 55 to 60%. The  left ventricle has normal function. The left ventricle has no regional  wall motion abnormalities. Left ventricular diastolic parameters are  indeterminate.   3. Right ventricular systolic function is normal. The right ventricular  size is normal.   4. A small pericardial effusion is present. The pericardial effusion is  anterior to the right ventricle.   5. The mitral valve is abnormal. Trivial mitral valve regurgitation. No  evidence of mitral stenosis.   6. The aortic valve is tricuspid. There is mild calcification of the  aortic valve. There is mild thickening of the aortic valve. Aortic valve  regurgitation is not visualized. Aortic valve sclerosis is present, with  no evidence of aortic valve stenosis.   7. The inferior vena cava is normal in size with greater than 50%  respiratory variability, suggesting right atrial pressure of 3 mmHg.    CHA2DS2-VASc  Score = 4  The patient's score is based upon: CHF History: 0 HTN History: 1 Diabetes History: 0 Stroke History: 0 Vascular Disease History: 1 Age Score: 1 Gender Score: 1       ASSESSMENT AND PLAN: Paroxysmal Atrial Fibrillation (ICD10:  I48.0) The patient's CHA2DS2-VASc score is 4, indicating a 4.8% annual risk of stroke.   Patient appears to be maintaining SR Smart Watch for home monitoring.   Secondary Hypercoagulable State (ICD10:  D68.69) The patient is at significant risk for stroke/thromboembolism based upon her CHA2DS2-VASc Score of 4.  However, the patient is not on anticoagulation due to her preference. (See visit note 09/26/23)    CAD No anginal symptoms Followed by Dr Santo   Bradycardia Patient notes nocturnal bradycardia on her smart watch. She frequently walks on her treadmill and her heart rate responds appropriately. Monitor 09/2023 showed no blocks or pauses. No indication for pacing.    Follow up with Dr Santo per recall.    Daril Kicks PA-C  Afib Clinic [facility] [address] [place], [place] [address] [phone]

## 2024-01-18 NOTE — Telephone Encounter (Signed)
 Patient called to follow-up on getting EKG test orders as discussed with on-call provider last night.

## 2024-01-19 NOTE — Telephone Encounter (Signed)
 Pt had an OV with [facility] PA on 01/18/24 at 1:30 pm.   EKG completed at this visit.

## 2024-02-01 ENCOUNTER — Encounter: Payer: Self-pay | Admitting: Family Medicine

## 2024-02-01 ENCOUNTER — Ambulatory Visit (INDEPENDENT_AMBULATORY_CARE_PROVIDER_SITE_OTHER): Admitting: Family Medicine

## 2024-02-01 VITALS — BP 120/70 | HR 81 | Temp 97.9°F | Ht 64.0 in | Wt 137.2 lb

## 2024-02-01 DIAGNOSIS — J441 Chronic obstructive pulmonary disease with (acute) exacerbation: Secondary | ICD-10-CM | POA: Diagnosis not present

## 2024-02-01 MED ORDER — DOXYCYCLINE HYCLATE 100 MG PO TABS: 100.0000 mg | ORAL_TABLET | Freq: Two times a day (BID) | ORAL | 0 refills | Status: AC

## 2024-02-01 NOTE — Progress Notes (Signed)
 Acute Office Visit  Patient ID: Katrina Campbell, female    DOB: 20-Jun-1955, 68 y.o.   MRN: [id number]  PCP: [name], FNP  Chief Complaint  Patient presents with   Cough    Pt c/o cough and congestion in chest for last 3 days.     Subjective:     Cough    Discussed the use of AI scribe software for clinical note transcription with the patient, who gave verbal consent to proceed.  History of Present Illness Katrina Campbell is a 68 year old female with COPD who presents with cough and congestion.  She developed congestion and cough approximately five days ago, with the mucus changing color to green. She started taking doxycycline , an antibiotic she had on hand, for three days, but her symptoms have not fully resolved. She experiences increased shortness of breath, particularly with physical activity such as mopping and sweeping, and a decrease in energy levels. No fever, chills, or body aches are present.  She has a history of COPD and reports using her inhalers. She recently completed a course of prednisone  for a shoulder issue, which she stopped about a week ago. She attributes an increased appetite and weight gain of three pounds over the last four weeks to this medication.  She has not been around anyone known to be sick, although she mentions that [facility] stays with a cough'. She has not performed a COVID test. No pain with breathing is reported.   Review of Systems  Respiratory:  Positive for cough.   All other systems reviewed and are negative.      Objective:    BP 120/70   Pulse 81   Temp 97.9 F (36.6 C)   Ht 5' 4 (1.626 m)   Wt 137 lb 3.2 oz (62.2 kg)   SpO2 99%   BMI 23.55 kg/m    Physical Exam Vitals and nursing note reviewed.  Constitutional:      Appearance: Normal appearance. She is normal weight.  HENT:     Head: Normocephalic and atraumatic.  Cardiovascular:     Rate and Rhythm: Normal rate and regular rhythm.     Pulses: Normal pulses.      Heart sounds: Normal heart sounds.  Pulmonary:     Effort: Pulmonary effort is normal.     Breath sounds: Normal breath sounds.  Skin:    General: Skin is warm and dry.  Neurological:     General: No focal deficit present.     Mental Status: She is alert and oriented to person, place, and time. Mental status is at baseline.  Psychiatric:        Mood and Affect: Mood normal.        Behavior: Behavior normal.        Thought Content: Thought content normal.        Judgment: Judgment normal.       No results found for any visits on 02/01/24.     Assessment & Plan:   Problem List Items Addressed This Visit   None   Assessment and Plan Assessment & Plan Acute COPD exacerbation Increased cough, green sputum, mild dyspnea. Partial improvement with doxycycline . - Continue doxycycline  for four more days. - Monitor for worsening symptoms; obtain chest x-ray if needed. - Send doxycycline  prescription to [employer] [facility] and [employer].  Increased appetite likely secondary to recent prednisone  use Significant appetite increase and weight gain post-prednisone . Poor tolerance noted. - Monitor weight and appetite. Evaluate  if symptoms persist or worsen.    Meds ordered this encounter  Medications   doxycycline  (VIBRA -TABS) 100 MG tablet    Sig: Take 1 tablet (100 mg total) by mouth 2 (two) times daily for 4 days.    Dispense:  8 tablet    Refill:  0    Supervising Provider:   DUANNE LOWERS T [[id number]]    Return if symptoms worsen or fail to improve.  Jeoffrey GORMAN Barrio, FNP [facility]

## 2024-02-12 ENCOUNTER — Encounter: Payer: Self-pay | Admitting: Family Medicine

## 2024-02-13 ENCOUNTER — Telehealth: Payer: Self-pay

## 2024-02-13 NOTE — Telephone Encounter (Signed)
 Copied from CRM [id number]. Topic: Clinical - Request for Lab/Test Order >> Feb 13, 2024 10:11 AM Katrina Campbell PARAS wrote: Reason for CRM: Pt called in to schedule lab work. No order in. Please advise [phone]

## 2024-02-14 ENCOUNTER — Other Ambulatory Visit

## 2024-02-14 DIAGNOSIS — R5383 Other fatigue: Secondary | ICD-10-CM

## 2024-02-14 DIAGNOSIS — E876 Hypokalemia: Secondary | ICD-10-CM

## 2024-02-14 DIAGNOSIS — E538 Deficiency of other specified B group vitamins: Secondary | ICD-10-CM

## 2024-02-14 DIAGNOSIS — E039 Hypothyroidism, unspecified: Secondary | ICD-10-CM

## 2024-02-14 DIAGNOSIS — E785 Hyperlipidemia, unspecified: Secondary | ICD-10-CM

## 2024-02-16 ENCOUNTER — Encounter: Payer: Self-pay | Admitting: Nurse Practitioner

## 2024-02-16 ENCOUNTER — Other Ambulatory Visit: Payer: Self-pay | Admitting: Nurse Practitioner

## 2024-02-16 ENCOUNTER — Ambulatory Visit (INDEPENDENT_AMBULATORY_CARE_PROVIDER_SITE_OTHER): Admitting: Nurse Practitioner

## 2024-02-16 ENCOUNTER — Ambulatory Visit (INDEPENDENT_AMBULATORY_CARE_PROVIDER_SITE_OTHER)

## 2024-02-16 VITALS — BP 122/76 | HR 107 | Temp 98.5°F | Ht 64.0 in | Wt 136.4 lb

## 2024-02-16 DIAGNOSIS — J479 Bronchiectasis, uncomplicated: Secondary | ICD-10-CM

## 2024-02-16 DIAGNOSIS — J441 Chronic obstructive pulmonary disease with (acute) exacerbation: Secondary | ICD-10-CM

## 2024-02-16 DIAGNOSIS — G4733 Obstructive sleep apnea (adult) (pediatric): Secondary | ICD-10-CM | POA: Diagnosis not present

## 2024-02-16 MED ORDER — PREDNISONE 10 MG PO TABS: ORAL_TABLET | ORAL | 0 refills | Status: DC

## 2024-02-16 MED ORDER — METHYLPREDNISOLONE ACETATE 80 MG/ML IJ SUSP
80.0000 mg | Freq: Once | INTRAMUSCULAR | Status: AC
  Administered 2024-02-16: 80 mg via INTRAMUSCULAR

## 2024-02-16 NOTE — Patient Instructions (Addendum)
 Continue Albuterol  inhaler 2 puffs or 3 mL neb every 6 hours as needed for shortness of breath or wheezing. Notify if symptoms persist despite rescue inhaler/neb use. Use your nebs 2-3 times a day until symptoms improve then as needed. Follow with flutter valve 10 times  Continue Trelegy 1 puff daily. Brush tongue and rinse mouth afterwards  Continue guaifenesin  [phone] mg Twice daily for cough/congestion   Steroid shot today Prednisone  taper. 4 tabs for 2 days, then 3 tabs for 2 days, 2 tabs for 2 days, then 1 tab for 2 days, then stop. Take in AM with food. Start tomorrow   Chest x ray today did not show any concerning findings. Stable changes of emphysema but no infection  Your oxygen  levels stayed at or above 91% with activity today in the office. Keep a close eye on them at home. If you can't keep them above 88% or if you feel worse, go to the hospital  Start use CPAP auto 5-15 cmH2O, nasal mask of choice, heated humidity every night, minimum of 4-6 hours a night.  Change equipment as directed. Wash your tubing with warm soap and water daily, hang to dry. Wash humidifier portion weekly. Use bottled, distilled water and change daily Be aware of reduced alertness and do not drive or operate heavy machinery if experiencing this or drowsiness.  Exercise encouraged, as tolerated. Healthy weight management discussed.  Avoid or decrease alcohol consumption and medications that make you more sleepy, if possible. Notify if persistent daytime sleepiness occurs even with consistent use of PAP therapy.  Change CPAP supplies... Every month Mask cushions and/or nasal pillows CPAP machine filters Every 3 months Mask frame (not including the headgear) CPAP tubing Every 6 months Mask headgear Chin strap (if applicable) Humidifier water tub  We discussed how untreated sleep apnea puts an individual at risk for cardiac arrhthymias, pulm HTN, DM, stroke and increases their risk for daytime  accidents. Given the severity of your sleep apnea, you will need to start CPAP therapy   Follow up in 2 weeks with Dr. Shelah or Katie Ibraheem Voris,NP. If symptoms do not improve or worsen, please contact office for sooner follow up or seek emergency care.

## 2024-02-16 NOTE — Progress Notes (Signed)
 @Patient  ID: Katrina Campbell, female    DOB: 1955-06-11, 68 y.o.   MRN: [id number]  Chief Complaint  Patient presents with   Acute Visit    Low SaO2 this morning.  SOB and generally does not feel well.    Referring provider: [name], FNP  HPI: 68 year old female, former smoker followed for bronchiectasis, emphysema, stage III NSCLC s/p chemoradiation and Imfinzi , hx of MAI (treated). She is a patient of Dr. Lanny and last seen in office 12/28/2023. Past medical history significant for HTN, CAD, GERD, hypothyroid, depression, anxiety.   TEST/EVENTS:  02/2015 spirometry: FVC 99, FEV1 70, ratio 71 09/12/2023 CT chest: radiation changes in RUL with scarring/collapse. Moderate emphysema. Patchy opacities in b/l lower lobes. Trace b/l pleural effusions 01/04/2024 HST: AHI 19.7/h, SpO2 low 88%  12/28/2023: OV with Dr. Shelah. Hx of a fib. Was on eliquis  but had hemoptysis so was d/c. Repeat sputum culture recently with MAI and haemophilus paraphrophilus. Next CT chest scheduled for December. No longer coughing. No hemoptysis. Not having any constitutional sx. Some occasional fatigue in evenings. Possible snoring. Moderate suspicion for OSA; plan to obtain HST.   02/16/2024: Today - acute Discussed the use of AI scribe software for clinical note transcription with the patient, who gave verbal consent to proceed.  History of Present Illness Katrina Campbell is a 68 year old female with COPD who presents with increased fatigue and difficulty breathing.  She has been experiencing increased fatigue and difficulty breathing, with oxygen  saturation levels not rising above 90% this morning, which is atypical for her. She lacks energy.  At the end of October, she was treated for a COPD flare-up with doxycycline  due to increased cough and green phlegm. The antibiotics resolved the phlegm, but fatigue and breathing difficulties persist.  She uses a nebulizer and albuterol  inhaler, which she finds helpful,  and last used a breathing treatment the previous night. She is also on Trelegy but has not been on oral steroids recently, except for a steroid shot in her shoulder.  No hemoptysis, chest congestion, fevers, night sweats, CP. No calf pain or swelling.   Recent lab work showed high vitamin B12 levels and a slightly low total iron binding capacity, but normal iron levels, hemoglobin, and thyroid  function.  After her last OV, she did have a home sleep study which revealed moderate sleep apnea. She does have some baseline daytime fatigue. She snores at night. She denies any drowsy driving or morning headaches. She has not started on any therapy.     Allergies  Allergen Reactions   Breztri  Aerosphere [Budeson-Glycopyrrol-Formoterol ] Shortness Of Breath   Codeine Other (See Comments)    Causes pt to blackout   Certolizumab Pegol Swelling   Etodolac Other (See Comments)    etodolac   Other Other (See Comments)    Product containing 3-hydroxy-3-methylglutaryl-coenzyme A reductase inhibitor (product)   Statins     Myalgias at higher doses   Triamterene Nausea Only   Tramadol  Anxiety    Immunization History  Administered Date(s) Administered   Fluad Quad(high Dose 65+) 03/15/2019   INFLUENZA, HIGH DOSE SEASONAL PF 03/21/2018   Influenza Inj Mdck Quad Pf 03/21/2018   Influenza,inj,Quad PF,6+ Mos 04/14/2020   PFIZER(Purple Top)SARS-COV-2 Vaccination 05/29/2019, 06/19/2019    Past Medical History:  Diagnosis Date   Anxiety    COPD (chronic obstructive pulmonary disease) (HCC)    Coronary artery disease    Dental staining 07/15/2019   Depression    History of  radiation therapy    Right lung- 11/03/20-12/15/20- Dr. 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     albuterol  (PROVENTIL ) (2.5 MG/3ML) 0.083% nebulizer solution Take 3 mLs (2.5 mg total) by nebulization every 6 (six) hours as needed for wheezing or shortness of breath. 540 mL 7   albuterol  (VENTOLIN  HFA) 108 (90 Base) MCG/ACT inhaler Inhale 2 puffs into the lungs every 6 (six) hours as needed for wheezing or shortness of breath. 18 g 1   Calcium  Citrate-Vitamin D  315-5 MG-MCG TABS Take 1 tablet by mouth every morning.     esomeprazole  (NEXIUM ) 40 MG capsule TAKE 1 CAPSULE DAILY 90 capsule 3   ezetimibe  (ZETIA ) 10 MG tablet Take 1 tablet (10 mg total) by mouth daily. 90 tablet 3   ferrous sulfate  325 (65 FE) MG tablet Take 325 mg by mouth every other day.     Fluticasone -Umeclidin-Vilant (TRELEGY ELLIPTA ) 100-62.5-25 MCG/ACT AEPB USE 1 INHALATION DAILY 180 each 3   Guaifenesin  (MUCINEX  MAXIMUM STRENGTH) 1200 MG TB12 Take 1,200 mg by mouth daily.     levothyroxine  (SYNTHROID ) 88 MCG tablet TAKE 1 TABLET(88 MCG) BY MOUTH DAILY 30 tablet 2   Multiple Vitamins-Minerals (IMMUNE SUPPORT PO) Take 1 capsule by mouth daily.     rosuvastatin  (CRESTOR ) 20 MG tablet Take 1 tablet (20 mg total) by mouth daily. Must keep scheduled appointment for future refills. Thank you. 90 tablet 3   temazepam  (RESTORIL ) 15 MG capsule Take 1 capsule (15 mg total) by mouth at bedtime as needed for sleep. TAKE 1 CAPSULE AT BEDTIME AS NEEDED 90 capsule 0   vitamin B-12 (CYANOCOBALAMIN ) 1000 MCG tablet Take 1,000 mcg by mouth daily.     predniSONE  (STERAPRED UNI-PAK 21  TAB) 5 MG (21) TBPK tablet Take 5 mg by mouth as directed. On a taper (Patient not taking: Reported on 02/01/2024)     Facility-Administered Medications Prior to Visit  Medication Dose Route Frequency Provider Last Rate Last Admin   Sonafine emulsion 1 application  1 application  Topical Once Kinard, James, MD         Review of Systems: as above    Physical Exam:  BP 122/76   Pulse (!) 107   Temp 98.5 F (36.9 C) (Oral)   Ht 5' 4 (1.626 m)   Wt 136 lb 6.4 oz (61.9 kg)   SpO2 96% Comment: room air  BMI 23.41 kg/m   GEN: Pleasant, interactive, well-kempt; in no acute distress HEENT:  Normocephalic and atraumatic. PERRLA. Sclera white. Nasal turbinates pink, moist and patent bilaterally. No rhinorrhea present. Oropharynx pink and moist, without exudate or edema. No lesions, ulcerations, or postnasal drip.  NECK:  Supple w/ fair ROM. No JVD present. No lymphadenopathy.   CV: RRR, no m/r/g,  no peripheral edema. Pulses intact, +2 bilaterally. No cyanosis, pallor or clubbing. PULMONARY:  Unlabored, regular breathing. Diminished bilaterally with end expiratory wheeze A&P. No accessory muscle use.  GI: BS present and normoactive. Soft, non-tender to palpation. MSK: No erythema, warmth or tenderness. Cap refil <2 sec all extrem. No deformities or joint swelling noted.  Neuro: A/Ox3. No focal deficits noted.   Skin: Warm, no lesions or rashe Psych: Normal affect and behavior. Judgement and thought content appropriate.     Lab Results:  CBC    Component Value Date/Time   WBC 5.3 02/14/2024 0929   RBC 4.73 02/14/2024 0929   HGB 13.8 02/14/2024 0929   HGB 13.0 09/12/2023 1008   HGB 13.7 09/22/2020 1325   HCT 42.4 02/14/2024 0929   HCT 41.2 09/22/2020 1325   PLT 158 02/14/2024 0929   PLT 111 (L) 09/12/2023 1008   PLT 143 (L) 09/22/2020 1325   MCV 89.6 02/14/2024 0929   MCV 89 09/22/2020 1325   MCH 29.2 02/14/2024 0929   MCHC 32.5 02/14/2024 0929   RDW 13.0 02/14/2024 0929    RDW 12.9 09/22/2020 1325   LYMPHSABS 1.1 09/12/2023 1008   LYMPHSABS 2.1 09/22/2020 1325   MONOABS 0.3 09/12/2023 1008   EOSABS 48 02/14/2024 0929   EOSABS 0.1 09/22/2020 1325   BASOSABS 21 02/14/2024 0929   BASOSABS 0.0 09/22/2020 1325    BMET    Component Value Date/Time   NA 142 02/14/2024 0929   NA 144 06/27/2023 0721   K 4.4 02/14/2024 0929   CL 109 02/14/2024 0929   CO2 25 02/14/2024 0929   GLUCOSE 87 02/14/2024 0929   BUN 8 02/14/2024 0929   BUN 17 06/27/2023 0721   CREATININE 0.67 02/14/2024 0929   CALCIUM  8.8 02/14/2024 0929   GFRNONAA >60 09/12/2023 1008   GFRNONAA 72 11/11/2019 1411   GFRAA 86 02/10/2020 0813   GFRAA 84 11/11/2019 1411    BNP No results found for: BNP   Imaging:  DG Chest 2 View Result Date: 02/16/2024 EXAM: 2 VIEW(S) XRAY OF THE CHEST 02/16/2024 10:51:55 AM COMPARISON: 08/11/2023 CLINICAL HISTORY: SOB; cough FINDINGS: LUNGS AND PLEURA: Emphysematous change is noted. No acute pulmonary abnormality is noted. No pleural effusion. No pneumothorax. HEART AND MEDIASTINUM: No acute abnormality of the cardiac and mediastinal silhouettes. BONES AND SOFT TISSUES: No acute osseous abnormality. IMPRESSION: 1. Emphysematous change. 2. No acute pulmonary abnormality. Electronically signed by: Lynwood Seip MD 02/16/2024 11:11 AM EST RP Workstation: [id number]    Administration History     None           No data to display          No results found for: NITRICOXIDE      Assessment & Plan:   Assessment & Plan Chronic obstructive pulmonary disease with acute exacerbation COPD exacerbation. Improved infectious symptoms with recent doxycycline  course but dyspnea persists. Wheezing present. Normal lab work, including CBC and thyroid . Wells criteria with low probability for PE. No hypoxemia on exertional walk test; lowest oxygen  level was 91% on room air. Chest x-ray showed stable emphysema findings. Will treat her with prednisone  taper and  steroid shot in office. Encouraged to optimize bronchodilator therapies. Continue mucociliary clearance therapies. Action plan in place. Strict ED precautions.  - Administered steroid shot today. - Prescribed prednisone : 40 mg for 2 days, 30 mg for 2 days, 20 mg for 2 days, 10 mg for 2 days. - Instructed to use breathing treatments at home 2-3 times  a day until symptoms improve. - Advised to monitor oxygen  levels at home; if levels drop below 88-90%, seek hospital care. - Scheduled follow-up appointment in two weeks to reassess condition.  Bronchiectasis Hx of MAI. Recent positive culture 11/2023 despite prior treatment. No infectious symptoms today; productive cough resolved with recent doxycycline  course. See above plan  Moderate OSA Moderate OSA on recent HST. Reviewed results today and risks of untreated moderate OSA. Possible contributor to AM hypoxia. Given severity, will start her on CPAP therapy. Educated on proper use/care of device. Risks/benefits reviewed. Safe driving practices reviewed.  - Start auto CPAP 5-15 cmH2O, nasal mask of choice and heated humidity     Advised if symptoms do not improve or worsen, to please contact office for sooner follow up or seek emergency care.   I spent 45 minutes of dedicated to the care of this patient on the date of this encounter to include pre-visit review of records, face-to-face time with the patient discussing conditions above, post visit ordering of testing, clinical documentation with the electronic health record, making appropriate referrals as documented, and communicating necessary findings to members of the patients care team.  Comer LULLA Rouleau, NP 02/16/2024  Pt aware and understands NP's role.

## 2024-02-19 ENCOUNTER — Ambulatory Visit: Payer: Self-pay | Admitting: Family Medicine

## 2024-02-19 DIAGNOSIS — R7401 Elevation of levels of liver transaminase levels: Secondary | ICD-10-CM

## 2024-02-20 LAB — TEST AUTHORIZATION

## 2024-02-20 LAB — IRON,TIBC AND FERRITIN PANEL
%SAT: 30 % (ref 16–45)
Ferritin: 117 ng/mL (ref 16–288)
Iron: 74 ug/dL (ref 45–160)
TIBC: 248 ug/dL — ABNORMAL LOW (ref 250–450)

## 2024-02-20 LAB — CBC WITH DIFFERENTIAL/PLATELET
Absolute Lymphocytes: 1415 {cells}/uL (ref 850–3900)
Absolute Monocytes: 387 {cells}/uL (ref 200–950)
Basophils Absolute: 21 {cells}/uL (ref 0–200)
Basophils Relative: 0.4 %
Eosinophils Absolute: 48 {cells}/uL (ref 15–500)
Eosinophils Relative: 0.9 %
HCT: 42.4 % (ref 35.0–45.0)
Hemoglobin: 13.8 g/dL (ref 11.7–15.5)
MCH: 29.2 pg (ref 27.0–33.0)
MCHC: 32.5 g/dL (ref 32.0–36.0)
MCV: 89.6 fL (ref 80.0–100.0)
MPV: 11.5 fL (ref 7.5–12.5)
Monocytes Relative: 7.3 %
Neutro Abs: 3429 {cells}/uL (ref 1500–7800)
Neutrophils Relative %: 64.7 %
Platelets: 158 Thousand/uL (ref 140–400)
RBC: 4.73 Million/uL (ref 3.80–5.10)
RDW: 13 % (ref 11.0–15.0)
Total Lymphocyte: 26.7 %
WBC: 5.3 Thousand/uL (ref 3.8–10.8)

## 2024-02-20 LAB — HEPATITIS PANEL, ACUTE
Hep A IgM: NONREACTIVE
Hep B C IgM: NONREACTIVE
Hepatitis B Surface Ag: NONREACTIVE
Hepatitis C Ab: NONREACTIVE

## 2024-02-20 LAB — COMPREHENSIVE METABOLIC PANEL WITH GFR
AG Ratio: 2.3 (calc) (ref 1.0–2.5)
ALT: 44 U/L — ABNORMAL HIGH (ref 6–29)
AST: 35 U/L (ref 10–35)
Albumin: 4.4 g/dL (ref 3.6–5.1)
Alkaline phosphatase (APISO): 87 U/L (ref 37–153)
BUN: 8 mg/dL (ref 7–25)
CO2: 25 mmol/L (ref 20–32)
Calcium: 8.8 mg/dL (ref 8.6–10.4)
Chloride: 109 mmol/L (ref 98–110)
Creat: 0.67 mg/dL (ref 0.60–1.00)
Globulin: 1.9 g/dL (ref 1.9–3.7)
Glucose, Bld: 87 mg/dL (ref 65–99)
Potassium: 4.4 mmol/L (ref 3.5–5.3)
Sodium: 142 mmol/L (ref 135–146)
Total Bilirubin: 0.5 mg/dL (ref 0.2–1.2)
Total Protein: 6.3 g/dL (ref 6.1–8.1)
eGFR: 94 mL/min/1.73m2 (ref >=60)

## 2024-02-20 LAB — TSH: TSH: 3.05 m[IU]/L (ref 0.40–4.50)

## 2024-02-20 LAB — VITAMIN B12: Vitamin B-12: 1328 pg/mL — ABNORMAL HIGH (ref 200–1100)

## 2024-03-04 ENCOUNTER — Other Ambulatory Visit

## 2024-03-06 ENCOUNTER — Ambulatory Visit
Admission: RE | Admit: 2024-03-06 | Discharge: 2024-03-06 | Disposition: A | Source: Ambulatory Visit | Attending: Family Medicine

## 2024-03-06 DIAGNOSIS — R7401 Elevation of levels of liver transaminase levels: Secondary | ICD-10-CM

## 2024-03-08 ENCOUNTER — Ambulatory Visit: Admitting: Nurse Practitioner

## 2024-03-08 ENCOUNTER — Encounter: Payer: Self-pay | Admitting: Nurse Practitioner

## 2024-03-08 VITALS — BP 118/76 | HR 70 | Temp 98.3°F | Ht 64.0 in | Wt 137.2 lb

## 2024-03-08 DIAGNOSIS — J479 Bronchiectasis, uncomplicated: Secondary | ICD-10-CM

## 2024-03-08 DIAGNOSIS — J449 Chronic obstructive pulmonary disease, unspecified: Secondary | ICD-10-CM

## 2024-03-08 DIAGNOSIS — G4733 Obstructive sleep apnea (adult) (pediatric): Secondary | ICD-10-CM | POA: Diagnosis not present

## 2024-03-08 DIAGNOSIS — J42 Unspecified chronic bronchitis: Secondary | ICD-10-CM

## 2024-03-08 NOTE — Progress Notes (Signed)
 @Patient  ID: Katrina Campbell, female    DOB: 11/14/1955, 68 y.o.   MRN: [id number]  Chief Complaint  Patient presents with   Shortness of Breath    SOB with walking on treadmill or exertion.  Will pick up CPAP on Monday 03/11/2024.    Referring provider: [name], FNP  HPI: 68 year old female, former smoker followed for bronchiectasis, emphysema, stage III NSCLC s/p chemoradiation and Imfinzi , hx of MAI (treated). She is a patient of Dr. Lanny and last seen in office 02/16/2024 by [facility] Katrina Campbell. Past medical history significant for HTN, CAD, GERD, hypothyroid, depression, anxiety.   TEST/EVENTS:  02/2015 spirometry: FVC 99, FEV1 70, ratio 71 09/12/2023 CT chest: radiation changes in RUL with scarring/collapse. Moderate emphysema. Patchy opacities in b/l lower lobes. Trace b/l pleural effusions 01/04/2024 HST: AHI 19.7/h, SpO2 low 88% 02/16/2024 CXR: emphysematous change. No acute process   12/28/2023: OV with Dr. Shelah. Hx of a fib. Was on eliquis  but had hemoptysis so was d/c. Repeat sputum culture recently with MAI and haemophilus paraphrophilus. Next CT chest scheduled for December. No longer coughing. No hemoptysis. Not having any constitutional sx. Some occasional fatigue in evenings. Possible snoring. Moderate suspicion for OSA; plan to obtain HST.   02/16/2024: OV with Mikya Don Katrina Campbell Dorinda Stehr is a 68 year old female with COPD who presents with increased fatigue and difficulty breathing. She has been experiencing increased fatigue and difficulty breathing, with oxygen  saturation levels not rising above 90% this morning, which is atypical for her. She lacks energy. At the end of October, she was treated for a COPD flare-up with doxycycline  due to increased cough and green phlegm. The antibiotics resolved the phlegm, but fatigue and breathing difficulties persist. She uses a nebulizer and albuterol  inhaler, which she finds helpful, and last used a breathing treatment the previous night. She  is also on Trelegy but has not been on oral steroids recently, except for a steroid shot in her shoulder. No hemoptysis, chest congestion, fevers, night sweats, CP. No calf pain or swelling.  Recent lab work showed high vitamin B12 levels and a slightly low total iron binding capacity, but normal iron levels, hemoglobin, and thyroid  function. After her last OV, she did have a home sleep study which revealed moderate sleep apnea. She does have some baseline daytime fatigue. She snores at night. She denies any drowsy driving or morning headaches. She has not started on any therapy.   03/08/2024: Today - follow up Discussed the use of AI scribe software for clinical note transcription with the patient, who gave verbal consent to proceed.  History of Present Illness  Katrina Campbell is a 68 year old female who presents for follow-up regarding shortness of breath and CPAP management.  She has been feeling better since the last visit since doing the prednisone . Her breathing feels back to her baseline. She does get winded with exercise, such as walking on her treadmill. No longer having any wheezing.   She has a chronic cough without significant sputum production. No fevers, chills, hemoptysis, weight loss. She uses guaifenesin  daily and her Trelegy. Not having to use her albuterol  much recently.  She is due to get her CPAP Monday.     Allergies  Allergen Reactions   Breztri  Aerosphere [Budeson-Glycopyrrol-Formoterol ] Shortness Of Breath   Codeine Other (See Comments)    Causes pt to blackout   Certolizumab Pegol Swelling   Etodolac Other (See Comments)    etodolac   Other Other (  See Comments)    Product containing 3-hydroxy-3-methylglutaryl-coenzyme A reductase inhibitor (product)   Statins     Myalgias at higher doses   Triamterene Nausea Only   Tramadol  Anxiety    Immunization History  Administered Date(s) Administered   Fluad Quad(high Dose 65+) 03/15/2019   INFLUENZA, HIGH DOSE  SEASONAL PF 03/21/2018   Influenza Inj Mdck Quad Pf 03/21/2018   Influenza,inj,Quad PF,6+ Mos 04/14/2020   PFIZER(Purple Top)SARS-COV-2 Vaccination 05/29/2019, 06/19/2019    Past Medical History:  Diagnosis Date   Anxiety    COPD (chronic obstructive pulmonary disease) (HCC)    Coronary artery disease    Dental staining 07/15/2019   Depression    History of radiation therapy    Right lung- 11/03/20-12/15/20- Dr. 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     albuterol  (PROVENTIL ) (2.5 MG/3ML) 0.083% nebulizer solution Take 3 mLs (2.5 mg total) by nebulization every 6 (six) hours as needed for wheezing or shortness of breath. 540 mL 7   albuterol  (VENTOLIN  HFA) 108 (90 Base) MCG/ACT inhaler Inhale 2 puffs into the lungs every 6 (six) hours as needed for wheezing or shortness of breath. 18 g 1   Calcium  Citrate-Vitamin D  315-5 MG-MCG TABS Take 1 tablet by mouth every morning.     esomeprazole  (NEXIUM ) 40 MG capsule TAKE 1 CAPSULE DAILY 90 capsule 3   ezetimibe  (ZETIA ) 10 MG tablet Take 1 tablet (10 mg total) by mouth daily. 90 tablet 3   ferrous sulfate  325 (65 FE) MG tablet Take 325 mg by mouth every other day.     Fluticasone -Umeclidin-Vilant (TRELEGY ELLIPTA ) 100-62.5-25 MCG/ACT AEPB USE 1 INHALATION DAILY  180 each 3   Guaifenesin  (MUCINEX  MAXIMUM STRENGTH) 1200 MG TB12 Take 1,200 mg by mouth daily.     levothyroxine  (SYNTHROID ) 88 MCG tablet TAKE 1 TABLET(88 MCG) BY MOUTH DAILY 30 tablet 2   Multiple Vitamins-Minerals (IMMUNE SUPPORT PO) Take 1 capsule by mouth daily.     rosuvastatin  (CRESTOR ) 20 MG tablet Take 1 tablet (20 mg total) by mouth daily. Must keep scheduled appointment for future refills. Thank you. 90 tablet 3   temazepam  (RESTORIL ) 15 MG capsule Take 1 capsule (15 mg total) by mouth at bedtime as needed for sleep. TAKE 1 CAPSULE AT BEDTIME AS NEEDED 90 capsule 0   vitamin B-12 (CYANOCOBALAMIN ) 1000 MCG tablet Take 1,000 mcg by mouth daily.     predniSONE  (DELTASONE ) 10 MG tablet 4 tabs for 2 days, then 3 tabs for 2 days, 2 tabs for 2 days, then 1 tab for 2 days, then stop 20 tablet 0   Facility-Administered Medications Prior to Visit  Medication Dose Route Frequency Provider Last Rate Last Admin   Sonafine emulsion 1 application  1 application  Topical Once Kinard, James,  MD         Review of Systems: as above    Physical Exam:  BP 118/76   Pulse 70   Temp 98.3 F (36.8 C) (Oral)   Ht 5' 4 (1.626 m)   Wt 137 lb 3.2 oz (62.2 kg)   SpO2 95% Comment: room air  BMI 23.55 kg/m   GEN: Pleasant, interactive, well-kempt; in no acute distress HEENT:  Normocephalic and atraumatic. PERRLA. Sclera white. Nasal turbinates pink, moist and patent bilaterally. No rhinorrhea present. Oropharynx pink and moist, without exudate or edema. No lesions, ulcerations, or postnasal drip.  NECK:  Supple w/ fair ROM. No JVD present. No lymphadenopathy.   CV: RRR, no m/r/g, no peripheral edema. Pulses intact, +2 bilaterally. No cyanosis, pallor or clubbing. PULMONARY:  Unlabored, regular breathing. Diminished bilaterally A&P w/o wheezes/rales/rhonchi. No accessory muscle use.  GI: BS present and normoactive. Soft, non-tender to palpation. MSK: No erythema, warmth or tenderness.  Neuro: A/Ox3.  No focal deficits noted.   Skin: Warm, no lesions or rashe Psych: Normal affect and behavior. Judgement and thought content appropriate.     Lab Results:  CBC    Component Value Date/Time   WBC 5.3 02/14/2024 0929   RBC 4.73 02/14/2024 0929   HGB 13.8 02/14/2024 0929   HGB 13.0 09/12/2023 1008   HGB 13.7 09/22/2020 1325   HCT 42.4 02/14/2024 0929   HCT 41.2 09/22/2020 1325   PLT 158 02/14/2024 0929   PLT 111 (L) 09/12/2023 1008   PLT 143 (L) 09/22/2020 1325   MCV 89.6 02/14/2024 0929   MCV 89 09/22/2020 1325   MCH 29.2 02/14/2024 0929   MCHC 32.5 02/14/2024 0929   RDW 13.0 02/14/2024 0929   RDW 12.9 09/22/2020 1325   LYMPHSABS 1.1 09/12/2023 1008   LYMPHSABS 2.1 09/22/2020 1325   MONOABS 0.3 09/12/2023 1008   EOSABS 48 02/14/2024 0929   EOSABS 0.1 09/22/2020 1325   BASOSABS 21 02/14/2024 0929   BASOSABS 0.0 09/22/2020 1325    BMET    Component Value Date/Time   NA 142 02/14/2024 0929   NA 144 06/27/2023 0721   K 4.4 02/14/2024 0929   CL 109 02/14/2024 0929   CO2 25 02/14/2024 0929   GLUCOSE 87 02/14/2024 0929   BUN 8 02/14/2024 0929   BUN 17 06/27/2023 0721   CREATININE 0.67 02/14/2024 0929   CALCIUM  8.8 02/14/2024 0929   GFRNONAA >60 09/12/2023 1008   GFRNONAA 72 11/11/2019 1411   GFRAA 86 02/10/2020 0813   GFRAA 84 11/11/2019 1411    BNP No results found for: BNP   Imaging:  DG Chest 2 View Result Date: 02/16/2024 EXAM: 2 VIEW(S) XRAY OF THE CHEST 02/16/2024 10:51:55 AM COMPARISON: 08/11/2023 CLINICAL HISTORY: SOB; cough FINDINGS: LUNGS AND PLEURA: Emphysematous change is noted. No acute pulmonary abnormality is noted. No pleural effusion. No pneumothorax. HEART AND MEDIASTINUM: No acute abnormality of the cardiac and mediastinal silhouettes. BONES AND SOFT TISSUES: No acute osseous abnormality. IMPRESSION: 1. Emphysematous change. 2. No acute pulmonary abnormality. Electronically signed by: Lynwood Seip MD 02/16/2024 11:11 AM EST RP Workstation:  [id number]    methylPREDNISolone  acetate (DEPO-MEDROL ) injection 80 mg     Date Action Dose Route User   02/16/2024 1202 Given 80 mg Intramuscular (Right Ventrogluteal) Issac No M, CMA           No data to display          No results found for: NITRICOXIDE  Assessment & Plan:   Assessment & Plan Chronic obstructive pulmonary disease Resolved AECOPD with recent prednisone  and doxycycline  course. Encouraged to continue maintenance regimen. Action plan in place. Graded exercises.  - Can trial albuterol  inhaler 15 minutes before exercise to alleviate shortness of breath. - Use Mucinex  DM over the counter as needed for cough and congestion. - Continue regular use of Trelegy   Bronchiectasis Hx of MAI. Recent positive culture 11/2023 despite prior treatment. No infectious symptoms today; productive cough resolved with recent doxycycline  course. Continue to monitor   Obstructive sleep apnea Awaiting CPAP machine delivery. - Follow up in 10-12 weeks with nurse practitioner to assess CPAP efficacy. - Contact provider if issues arise with CPAP machine, such as pressure settings.    Advised if symptoms do not improve or worsen, to please contact office for sooner follow up or seek emergency care.   I spent 25 minutes of dedicated to the care of this patient on the date of this encounter to include pre-visit review of records, face-to-face time with the patient discussing conditions above, post visit ordering of testing, clinical documentation with the electronic health record, making appropriate referrals as documented, and communicating necessary findings to members of the patients care team.  Katrina LULLA Rouleau, Katrina Campbell 03/08/2024  Pt aware and understands Katrina Campbell's role.

## 2024-03-08 NOTE — Patient Instructions (Addendum)
 Continue Albuterol  inhaler 2 puffs or 3 mL neb every 6 hours as needed for shortness of breath or wheezing. Notify if symptoms persist despite rescue inhaler/neb use. Follow with flutter valve 10 times for congestion Continue Trelegy 1 puff daily. Brush tongue and rinse mouth afterwards  Continue guaifenesin  [phone] mg Twice daily for cough/congestion   Glad you are feeling better!   Start use CPAP auto 5-15 cmH2O, nasal mask of choice, heated humidity every night, minimum of 4-6 hours a night.  Change equipment as directed. Wash your tubing with warm soap and water daily, hang to dry. Wash humidifier portion weekly. Use bottled, distilled water and change daily Be aware of reduced alertness and do not drive or operate heavy machinery if experiencing this or drowsiness.  Exercise encouraged, as tolerated. Healthy weight management discussed.  Avoid or decrease alcohol consumption and medications that make you more sleepy, if possible. Notify if persistent daytime sleepiness occurs even with consistent use of PAP therapy.   Change CPAP supplies... Every month Mask cushions and/or nasal pillows CPAP machine filters Every 3 months Mask frame (not including the headgear) CPAP tubing Every 6 months Mask headgear Chin strap (if applicable) Humidifier water tub   We discussed how untreated sleep apnea puts an individual at risk for cardiac arrhthymias, pulm HTN, DM, stroke and increases their risk for daytime accidents. Given the severity of your sleep apnea, you will need to start CPAP therapy    Follow up in 10-12 weeks with Tammy Parrett,NP or Landry Hope PIETY for new start CPAP f/u. If symptoms do not improve or worsen, please contact office for sooner follow up or seek emergency care

## 2024-03-13 ENCOUNTER — Ambulatory Visit: Admitting: Emergency Medicine

## 2024-03-18 ENCOUNTER — Inpatient Hospital Stay: Attending: Internal Medicine

## 2024-03-18 ENCOUNTER — Ambulatory Visit (HOSPITAL_COMMUNITY): Admission: RE | Admit: 2024-03-18 | Discharge: 2024-03-18 | Attending: Internal Medicine | Admitting: Internal Medicine

## 2024-03-18 DIAGNOSIS — Z923 Personal history of irradiation: Secondary | ICD-10-CM | POA: Diagnosis not present

## 2024-03-18 DIAGNOSIS — Z9221 Personal history of antineoplastic chemotherapy: Secondary | ICD-10-CM | POA: Insufficient documentation

## 2024-03-18 DIAGNOSIS — Z8701 Personal history of pneumonia (recurrent): Secondary | ICD-10-CM | POA: Diagnosis not present

## 2024-03-18 DIAGNOSIS — Z85118 Personal history of other malignant neoplasm of bronchus and lung: Secondary | ICD-10-CM | POA: Insufficient documentation

## 2024-03-18 DIAGNOSIS — C3490 Malignant neoplasm of unspecified part of unspecified bronchus or lung: Secondary | ICD-10-CM | POA: Insufficient documentation

## 2024-03-18 LAB — CBC WITH DIFFERENTIAL (CANCER CENTER ONLY)
Abs Immature Granulocytes: 0.01 K/uL (ref 0.00–0.07)
Basophils Absolute: 0 K/uL (ref 0.0–0.1)
Basophils Relative: 0 %
Eosinophils Absolute: 0.1 K/uL (ref 0.0–0.5)
Eosinophils Relative: 1 %
HCT: 40.5 % (ref 36.0–46.0)
Hemoglobin: 14 g/dL (ref 12.0–15.0)
Immature Granulocytes: 0 %
Lymphocytes Relative: 24 %
Lymphs Abs: 1.3 K/uL (ref 0.7–4.0)
MCH: 30.2 pg (ref 26.0–34.0)
MCHC: 34.6 g/dL (ref 30.0–36.0)
MCV: 87.3 fL (ref 80.0–100.0)
Monocytes Absolute: 0.3 K/uL (ref 0.1–1.0)
Monocytes Relative: 6 %
Neutro Abs: 3.5 K/uL (ref 1.7–7.7)
Neutrophils Relative %: 69 %
Platelet Count: 148 K/uL — ABNORMAL LOW (ref 150–400)
RBC: 4.64 MIL/uL (ref 3.87–5.11)
RDW: 13.8 % (ref 11.5–15.5)
WBC Count: 5.1 K/uL (ref 4.0–10.5)
nRBC: 0 % (ref 0.0–0.2)

## 2024-03-18 LAB — CMP (CANCER CENTER ONLY)
ALT: 44 U/L (ref 0–44)
AST: 35 U/L (ref 15–41)
Albumin: 4.5 g/dL (ref 3.5–5.0)
Alkaline Phosphatase: 98 U/L (ref 38–126)
Anion gap: 9 (ref 5–15)
BUN: 9 mg/dL (ref 8–23)
CO2: 26 mmol/L (ref 22–32)
Calcium: 9.3 mg/dL (ref 8.9–10.3)
Chloride: 103 mmol/L (ref 98–111)
Creatinine: 0.74 mg/dL (ref 0.44–1.00)
GFR, Estimated: >60 mL/min (ref >60)
Glucose, Bld: 108 mg/dL — ABNORMAL HIGH (ref 70–99)
Potassium: 4.3 mmol/L (ref 3.5–5.1)
Sodium: 138 mmol/L (ref 135–145)
Total Bilirubin: 0.4 mg/dL (ref 0.0–1.2)
Total Protein: 7 g/dL (ref 6.5–8.1)

## 2024-03-18 MED ORDER — SODIUM CHLORIDE (PF) 0.9 % IJ SOLN
INTRAMUSCULAR | Status: AC
  Filled 2024-03-18: qty 50

## 2024-03-18 MED ORDER — IOHEXOL 300 MG/ML  SOLN
75.0000 mL | Freq: Once | INTRAMUSCULAR | Status: AC | PRN
  Administered 2024-03-18: 11:00:00 75 mL via INTRAVENOUS

## 2024-03-21 ENCOUNTER — Encounter: Payer: Self-pay | Admitting: Emergency Medicine

## 2024-03-21 ENCOUNTER — Ambulatory Visit: Admitting: Emergency Medicine

## 2024-03-21 VITALS — BP 132/78 | HR 71 | Temp 98.1°F | Ht 64.0 in | Wt 137.4 lb

## 2024-03-21 DIAGNOSIS — G4733 Obstructive sleep apnea (adult) (pediatric): Secondary | ICD-10-CM | POA: Insufficient documentation

## 2024-03-21 DIAGNOSIS — J42 Unspecified chronic bronchitis: Secondary | ICD-10-CM

## 2024-03-21 DIAGNOSIS — J449 Chronic obstructive pulmonary disease, unspecified: Secondary | ICD-10-CM | POA: Diagnosis not present

## 2024-03-21 DIAGNOSIS — C3491 Malignant neoplasm of unspecified part of right bronchus or lung: Secondary | ICD-10-CM | POA: Diagnosis not present

## 2024-03-21 DIAGNOSIS — J479 Bronchiectasis, uncomplicated: Secondary | ICD-10-CM

## 2024-03-21 MED ORDER — TRELEGY ELLIPTA 100-62.5-25 MCG/ACT IN AEPB: INHALATION_SPRAY | RESPIRATORY_TRACT | 3 refills | Status: DC

## 2024-03-21 NOTE — Progress Notes (Signed)
 Subjective:    Patient ID: Katrina Campbell, female    DOB: June 10, 1955, 68 y.o.   MRN: [id number]  HPI  ROV 03/21/2024 --follow-up visit 68 year old woman with a history of COPD, bronchiectasis (treated [facility] for 12 months, prior Bjerkandera adusta), stage IIIa squamous cell lung cancer treated by chemoradiation and then Imfinzi , currently on observation.  She has obstructive sleep apnea and is starting CPAP therapy.  She has atrial fibrillation and was previously on Eliquis  but it was stopped in the setting of hemoptysis.  She was treated for an acute exacerbation 02/16/2024 - had fatigue, fever. She feels well currently. She likes the CPAP, feels that it is helping her - better sleep quality and breathing during the day. Occasional cough, some sputum. On trelegy, albuterol  rare use. She has an albuterol  nebulizer that she uses rarely.   CT scan of the chest 03/18/2024 reviewed by me, shows stable irregular right hilar opacity with segmental collapse of the right upper lobe consistent with posttreatment changes.  No evidence of local recurrence.  There has been interval resolution of several subpleural opacities at the bases, now with some new similar subpleural opacities in the right upper lobe consistent with an inflammatory process   Review of Systems As per HPI  Past Medical History:  Diagnosis Date   Anxiety    COPD (chronic obstructive pulmonary disease) (HCC)    Coronary artery disease    Dental staining 07/15/2019   Depression    History of radiation therapy    Right lung- 11/03/20-12/15/20- Dr. 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        Objective:   Physical Exam Vitals:   03/21/24 1341  BP: 132/78  Pulse: 71  Temp: 98.1 F (36.7 C)  SpO2: 95%  Weight: 137 lb 6.4 oz (62.3 kg)  Height: 5' 4 (1.626 m)   Gen: Pleasant, well-nourished, in no distress,   normal affect  ENT: No lesions,  mouth clear,  oropharynx clear, no postnasal drip  Neck: No JVD, no stridor  Lungs: No use of accessory muscles, coarse bilaterally, few scattered rhonchi, end expiratory wheeze, no crackles  Cardiovascular: RRR, heart sounds normal, no murmur or gallops, no peripheral edema  Musculoskeletal: No deformities, no cyanosis or clubbing  Neuro: alert, awake, non focal  Skin: Warm, no lesions or rash      Assessment & Plan:  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  No evidence of lung cancer recurrence.  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  We reviewed your CT scan of the chest today. We will plan to repeat your CT chest in June 2026. Follow Dr. Shelah in June so we can review your CT chest.  Please call us  sooner if you have any problems.  Bronchiectasis without complication (HCC) Symptoms well-controlled, cough is not intrusive.  She does not require a pulmonary hygiene regimen at this time.  She does have some waxing and waning nodular infiltrates at the right base, question whether she still colonized with Mycobacterium avium.  She does note that she is not having cough, sputum production like she did when her [facility] was active.  COPD (chronic obstructive pulmonary disease) (HCC) Please continue Trelegy once daily.  Rinse and  gargle after using. Keep your albuterol  available either 2 puffs or 1 nebulizer treatment up to every 4 hours if needed for shortness of breath, chest tightness, wheezing.  OSA (obstructive sleep apnea) Congratulations on wearing your CPAP reliably.  We confirmed today that you are getting benefit. Follow-up in our office in about 2 months to confirm your CPAP usage  I personally spent a total of 30 minutes in the care of the patient today  including preparing to see the patient, getting/reviewing separately obtained history, performing a medically appropriate exam/evaluation, counseling and educating, placing orders, documenting clinical information in the EHR, independently interpreting results, and communicating results.    Lamar Chris, MD, PhD 03/21/2024, 2:07 PM  Pulmonary and Critical Care [phone] or if no answer before 7:00PM call [phone] For any issues after 7:00PM please call eLink [phone]

## 2024-03-21 NOTE — Patient Instructions (Addendum)
 We reviewed your CT scan of the chest today. We will plan to repeat your CT chest in June 2026. Please continue Trelegy once daily.  Rinse and gargle after using. Keep your albuterol  available either 2 puffs or 1 nebulizer treatment up to every 4 hours if needed for shortness of breath, chest tightness, wheezing. Congratulations on wearing your CPAP reliably.  We confirmed today that you are getting benefit. Follow-up in our office in about 2 months to confirm your CPAP usage Follow Dr. Shelah in June so we can review your CT chest.  Please call us  sooner if you have any problems.

## 2024-03-21 NOTE — Assessment & Plan Note (Signed)
 Right hilar opacity is stable.  No evidence of lung cancer recurrence.  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  We reviewed your CT scan of the chest today. We will plan to repeat your CT chest in June 2026. Follow Dr. Shelah in June so we can review your CT chest.  Please call us  sooner if you have any problems.

## 2024-03-21 NOTE — Assessment & Plan Note (Signed)
 Symptoms well-controlled, cough is not intrusive.  She does not require a pulmonary hygiene regimen at this time.  She does have some waxing and waning nodular infiltrates at the right base, question whether she still colonized with Mycobacterium avium.  She does note that she is not having cough, sputum production like she did when her [facility] was active.

## 2024-03-21 NOTE — Assessment & Plan Note (Signed)
 Congratulations on wearing your CPAP reliably.  We confirmed today that you are getting benefit. Follow-up in our office in about 2 months to confirm your CPAP usage

## 2024-03-21 NOTE — Assessment & Plan Note (Signed)
 Please continue Trelegy once daily.  Rinse and gargle after using. Keep your albuterol  available either 2 puffs or 1 nebulizer treatment up to every 4 hours if needed for shortness of breath, chest tightness, wheezing.

## 2024-03-25 ENCOUNTER — Telehealth: Payer: Self-pay | Admitting: Internal Medicine

## 2024-03-25 ENCOUNTER — Other Ambulatory Visit

## 2024-03-25 ENCOUNTER — Inpatient Hospital Stay: Admitting: Internal Medicine

## 2024-03-25 VITALS — BP 145/69 | HR 73 | Resp 17 | Ht 64.0 in | Wt 137.4 lb

## 2024-03-25 DIAGNOSIS — C3490 Malignant neoplasm of unspecified part of unspecified bronchus or lung: Secondary | ICD-10-CM

## 2024-03-25 DIAGNOSIS — Z85118 Personal history of other malignant neoplasm of bronchus and lung: Secondary | ICD-10-CM | POA: Diagnosis not present

## 2024-03-25 NOTE — Progress Notes (Signed)
 "     [facility] [phone]   [phone]) [phone]  OFFICE PROGRESS NOTE  [name], FNP [address] [place] [address]  DIAGNOSIS: Stage IIIA (TX, N2, M0) non-small cell lung cancer, squamous cell carcinoma presented with subcarinal mass/lymphadenopathy diagnosed in July 2022.   PRIOR THERAPY:  1) Weekly carboplatin  for AUC of 2 and paclitaxel  45 mg/m2.  First dose expected on 11/02/2020.  Status post 7 cycles.  Last dose was given 12/14/2020. 2) Consolidation treatment with immunotherapy with Imfinzi  1500 Mg IV every 4 weeks.  First dose 01/14/2021.  Status post 13 cycles   CURRENT THERAPY: Observation.  INTERVAL HISTORY: Katrina Campbell 68 y.o. female returns to the clinic today for follow-up visit. Discussed the use of AI scribe software for clinical note transcription with the patient, who gave verbal consent to proceed.  History of Present Illness Katrina Campbell is a 68 year old female with stage IIIA non-small cell lung cancer, squamous cell carcinoma, status post chemoradiation and durvalumab  consolidation, who presents for routine follow-up and restaging.  She was diagnosed with stage IIIA non-small cell lung cancer, squamous cell carcinoma in July 2022 and completed concurrent chemoradiation with weekly carboplatin  and paclitaxel , followed by one year of durvalumab  consolidation therapy, which concluded in September 2023. She has been under surveillance since completion of immunotherapy.  She reports overall well-being, with no chest pain, dyspnea, or significant respiratory symptoms since her last visit six months ago. She notes an occasional mild cough, which she attributes to post-treatment changes, without new or progressive symptoms. She continues to follow with her pulmonologist for ongoing pulmonary management.     MEDICAL HISTORY: Past Medical History:  Diagnosis Date   Anxiety    COPD (chronic obstructive pulmonary disease) (HCC)     Coronary artery disease    Dental staining 07/15/2019   Depression    History of radiation therapy    Right lung- 11/03/20-12/15/20- Dr. 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  MEDICATIONS:  Current Outpatient Medications  Medication Sig Dispense Refill   acetaminophen  (TYLENOL ) 500 MG tablet Take 500 mg by mouth as needed for moderate pain (pain score 4-6) or headache.     albuterol  (PROVENTIL ) (2.5 MG/3ML) 0.083% nebulizer solution Take 3 mLs (2.5 mg total) by nebulization every 6 (six) hours as needed for wheezing or shortness of breath. 540 mL 7   albuterol  (VENTOLIN  HFA) 108 (90 Base) MCG/ACT inhaler Inhale 2 puffs into the lungs every 6 (six) hours as needed for wheezing or shortness of breath. 18 g 1   Calcium  Citrate-Vitamin D  315-5 MG-MCG TABS Take 1 tablet by mouth every morning.     esomeprazole  (NEXIUM ) 40 MG capsule TAKE 1 CAPSULE DAILY 90 capsule 3   ezetimibe  (ZETIA ) 10 MG tablet Take 1 tablet (10 mg total) by mouth daily. 90 tablet 3   ferrous sulfate  325 (65 FE) MG tablet Take 325 mg by mouth every other day.     Guaifenesin  (MUCINEX  MAXIMUM STRENGTH) 1200 MG TB12 Take 1,200 mg by mouth daily.     levothyroxine  (SYNTHROID ) 88 MCG tablet TAKE 1 TABLET(88 MCG) BY MOUTH DAILY 30 tablet 2   Multiple Vitamins-Minerals (IMMUNE SUPPORT PO) Take 1 capsule by mouth daily.  rosuvastatin  (CRESTOR ) 20 MG tablet Take 1 tablet (20 mg total) by mouth daily. Must keep scheduled appointment for future refills. Thank you. 90 tablet 3   temazepam  (RESTORIL ) 15 MG capsule Take 1 capsule (15 mg total) by mouth at bedtime as needed for sleep. TAKE 1 CAPSULE AT BEDTIME AS NEEDED 90 capsule 0    Fluticasone -Umeclidin-Vilant (TRELEGY ELLIPTA ) 100-62.5-25 MCG/ACT AEPB USE 1 INHALATION DAILY 180 each 3   No current facility-administered medications for this visit.   Facility-Administered Medications Ordered in Other Visits  Medication Dose Route Frequency Provider Last Rate Last Admin   Sonafine emulsion 1 application  1 application  Topical Once Shannon Agent, MD        SURGICAL HISTORY:  Past Surgical History:  Procedure Laterality Date   BALLOON DILATION N/A 04/29/2021   Procedure: BALLOON DILATION;  Surgeon: Teressa Toribio SQUIBB, MD;  Location: [facility];  Service: Endoscopy;  Laterality: N/A;   BIOPSY  01/14/2021   Procedure: BIOPSY;  Surgeon: [name] Adine CROME, DO;  Location: [facility];  Service: Pulmonary;;   BRONCHIAL BIOPSY  10/08/2020   Procedure: BRONCHIAL BIOPSIES;  Surgeon: [name] Adine CROME, DO;  Location: [facility];  Service: Pulmonary;;   BRONCHIAL BRUSHINGS  10/08/2020   Procedure: BRONCHIAL BRUSHINGS;  Surgeon: [name] Adine CROME, DO;  Location: [facility];  Service: Pulmonary;;   BRONCHIAL BRUSHINGS  01/14/2021   Procedure: BRONCHIAL BRUSHINGS;  Surgeon: [name] Adine CROME, DO;  Location: [facility];  Service: Pulmonary;;   BRONCHIAL WASHINGS  10/08/2020   Procedure: BRONCHIAL WASHINGS;  Surgeon: [name] Adine CROME, DO;  Location: [facility];  Service: Pulmonary;;   BRONCHIAL WASHINGS  01/14/2021   Procedure: BRONCHIAL WASHINGS;  Surgeon: [name] Adine CROME, DO;  Location: [facility];  Service: Pulmonary;;   ESOPHAGOGASTRODUODENOSCOPY (EGD) WITH PROPOFOL  N/A 04/29/2021   Procedure: ESOPHAGOGASTRODUODENOSCOPY (EGD) WITH PROPOFOL ;  Surgeon: Teressa Toribio SQUIBB, MD;  Location: [facility];  Service: Endoscopy;  Laterality: N/A;   FINE NEEDLE ASPIRATION  10/08/2020   Procedure: FINE NEEDLE ASPIRATION (FNA) LINEAR;  Surgeon: [name] Adine CROME, DO;  Location: [facility];  Service: Pulmonary;;   LEFT HEART CATH AND CORONARY ANGIOGRAPHY N/A 11/07/2017   Procedure: LEFT HEART  CATH AND CORONARY ANGIOGRAPHY;  Surgeon: Mady Bruckner, MD;  Location: [facility];  Service: Cardiovascular;  Laterality: N/A;   NASAL SINUS SURGERY  1986   VIDEO BRONCHOSCOPY Bilateral 01/29/2019   Procedure: VIDEO BRONCHOSCOPY WITH FLUORO;  Surgeon: Mannam, Praveen, MD;  Location: [facility];  Service: Cardiopulmonary;  Laterality: Bilateral;   VIDEO BRONCHOSCOPY Right 01/14/2021   Procedure: VIDEO BRONCHOSCOPY WITHOUT FLUORO;  Surgeon: [name] Adine CROME, DO;  Location: [facility];  Service: Pulmonary;  Laterality: Right;  possible cryotherapy   VIDEO BRONCHOSCOPY WITH ENDOBRONCHIAL ULTRASOUND N/A 10/08/2020   Procedure: VIDEO BRONCHOSCOPY WITH ENDOBRONCHIAL ULTRASOUND;  Surgeon: [name] Adine CROME, DO;  Location: [facility];  Service: Pulmonary;  Laterality: N/A;    REVIEW OF SYSTEMS:  A comprehensive review of systems was negative except for: Respiratory: positive for cough   PHYSICAL EXAMINATION: General appearance: alert, cooperative, and no distress Head: Normocephalic, without obvious abnormality, atraumatic Neck: no adenopathy, no JVD, supple, symmetrical, trachea midline, and thyroid  not enlarged, symmetric, no tenderness/mass/nodules Lymph nodes: Cervical, supraclavicular, and axillary nodes normal. Resp: clear to auscultation bilaterally Back: symmetric, no curvature. ROM normal. No CVA tenderness. Cardio: regular rate and rhythm, S1, S2 normal, no murmur, click, rub or gallop GI: soft, non-tender; bowel sounds normal; no masses,  no organomegaly Extremities: extremities normal,  atraumatic, no cyanosis or edema  ECOG PERFORMANCE STATUS: 1 - Symptomatic but completely ambulatory  Blood pressure (!) 141/73, pulse 73, resp. rate 17, height 5' 4 (1.626 m), weight 137 lb 6.4 oz (62.3 kg), SpO2 96%.  LABORATORY DATA: Lab Results  Component Value Date   WBC 5.1 03/18/2024   HGB 14.0 03/18/2024   HCT 40.5 03/18/2024   MCV 87.3 03/18/2024   PLT 148 (L) 03/18/2024       Chemistry      Component Value Date/Time   NA 138 03/18/2024 0955   NA 144 06/27/2023 0721   K 4.3 03/18/2024 0955   CL 103 03/18/2024 0955   CO2 26 03/18/2024 0955   BUN 9 03/18/2024 0955   BUN 17 06/27/2023 0721   CREATININE 0.74 03/18/2024 0955   CREATININE 0.67 02/14/2024 0929      Component Value Date/Time   CALCIUM  9.3 03/18/2024 0955   ALKPHOS 98 03/18/2024 0955   AST 35 03/18/2024 0955   ALT 44 03/18/2024 0955   BILITOT 0.4 03/18/2024 0955       RADIOGRAPHIC STUDIES: CT Chest W Contrast Result Date: 03/18/2024 CLINICAL DATA:  Non-small cell lung cancer (NSCLC), staging. * Tracking Code: BO * EXAM: CT CHEST WITH CONTRAST TECHNIQUE: Multidetector CT imaging of the chest was performed during intravenous contrast administration. RADIATION DOSE REDUCTION: This exam was performed according to the departmental dose-optimization program which includes automated exposure control, adjustment of the mA and/or kV according to patient size and/or use of iterative reconstruction technique. CONTRAST:  75mL OMNIPAQUE  IOHEXOL  300 MG/ML  SOLN COMPARISON:  CT scan chest from 09/12/2023. FINDINGS: Cardiovascular: Normal cardiac size. No pericardial effusion. No aortic aneurysm. There are coronary artery calcifications, in keeping with coronary artery disease. There are also mild-to-moderate peripheral atherosclerotic vascular calcifications of thoracic aorta and its major branches. Mediastinum/Nodes: Visualized thyroid  gland appears grossly unremarkable. No solid / cystic mediastinal masses. The esophagus is nondistended precluding optimal assessment. No axillary, mediastinal or hilar lymphadenopathy by size criteria. Lungs/Pleura: The central tracheo-bronchial tree is patent. Redemonstration of diffuse moderate emphysematous changes. Redemonstration of irregular soft tissue in the right hilum with associated chronic segmental collapse of the right upper lobe. There is interval resolution of  previously seen multifocal subpleural solid opacities in the bilateral lung bases; however, there are new, similar characteristic irregular, subpleural opacities in the right lower lobe (series 8, images 65 and 97). No new suspicious mass or lung collapse. No pleural effusion or pneumothorax. No suspicious lung nodule. Upper Abdomen: There is an ill-defined hypoattenuating area in the right hepatic dome (series 2, image 139), which is too small to adequately characterize but appear grossly similar to the prior study. Remaining visualized upper abdominal viscera within normal limits. Musculoskeletal: The visualized soft tissues of the chest wall are grossly unremarkable. No suspicious osseous lesions. There are mild multilevel degenerative changes in the visualized spine. IMPRESSION: 1. Redemonstration of irregular soft tissue in the right hilum with associated chronic segmental collapse of the right upper lobe. Findings are favored to represent postradiation/posttreatment changes. 2. There is interval resolution of previously seen multifocal subpleural solid opacities in the bilateral lung bases; however, there are new, similar characteristic irregular, subpleural opacities in the right lower lobe. These are nonspecific but favored to represent infectious/inflammatory etiology. Short-term follow-up examination is recommended in 3 months. 3. Multiple other nonacute observations, as described above. Aortic Atherosclerosis (ICD10-I70.0) and Emphysema (ICD10-J43.9). Electronically Signed   By: Ree Molt M.D.   On: 03/18/2024 11:45  US  Abdomen Limited RUQ (LIVER/GB) Result Date: 03/09/2024 CLINICAL DATA:  Elevated liver enzymes EXAM: ULTRASOUND ABDOMEN LIMITED RIGHT UPPER QUADRANT COMPARISON:  PET-CT 09/21/2020 FINDINGS: Gallbladder: Gallstones: None Sludge: None Gallbladder Wall: Within normal limits Pericholecystic fluid: None Sonographic Murphy's Sign: Negative per technologist Common bile duct: Diameter: 2  mm Liver: Parenchymal echogenicity: Within normal limits Contours: Normal Lesions: None Portal vein: Patent.  Hepatopetal flow Other: None. IMPRESSION: No significant sonographic abnormality of the liver or gallbladder. Electronically Signed   By: Aliene Lloyd M.D.   On: 03/09/2024 12:16    ASSESSMENT AND PLAN: This is a very pleasant 68 years old white female recently diagnosed with a stage IIIA (TX, N2, M0) non-small cell lung cancer, squamous cell carcinoma presented with subcarinal mass/lymphadenopathy diagnosed in July 2022. The patient underwent a course of concurrent chemoradiation with weekly carboplatin  for AUC of 2 and paclitaxel  45 Mg/M2 status post 7 cycles. The patient tolerated her treatment well except for fatigue and sore throat. The patient completed a course of consolidation treatment with immunotherapy with Imfinzi  1500 Mg IV every 4 weeks status post 13 cycles.  The patient is currently on observation. She had repeat CT scan of the chest performed recently.  I personally independently reviewed the scan and discussed the results with the patient today.  Her scan showed no concerning findings for disease recurrence or metastasis. Assessment and Plan Assessment & Plan Stage IIIA non-small cell lung cancer, squamous cell carcinoma Status post concurrent chemoradiation with weekly carboplatin  and paclitaxel , followed by one year of durvalumab  consolidation therapy, completed September 2023. She is currently under observation with no evidence of disease recurrence on recent chest CT, which demonstrated resolving post-treatment inflammation. She remains asymptomatic except for occasional mild cough attributed to post-treatment changes. No new or worsening respiratory symptoms. - Reviewed recent chest CT demonstrating no evidence of recurrence and resolving inflammation. - Provided scan report to her. - Recommended continued observation with follow-up in six months. - Instructed her to  obtain chest CT one week prior to next visit, unless performed by pulmonology, in which case only one scan is needed. She was advised to call immediately if she has any other concerning symptoms in the interval. The total time spent in the appointment was 30 minutes including review of chart and various tests results, discussions about plan of care and coordination of care plan .  The patient voices understanding of current disease status and treatment options and is in agreement with the current care plan.  All questions were answered. The patient knows to call the clinic with any problems, questions or concerns. We can certainly see the patient much sooner if necessary.  Disclaimer: This note was dictated with voice recognition software. Similar sounding words can inadvertently be transcribed and may not be corrected upon review.        "

## 2024-03-25 NOTE — Telephone Encounter (Signed)
 Scheduled patient for next appointment in 6 months. Called and spoke with the patient, she is aware.

## 2024-04-02 ENCOUNTER — Encounter: Admitting: Family Medicine

## 2024-04-03 ENCOUNTER — Other Ambulatory Visit: Payer: Self-pay | Admitting: Family Medicine

## 2024-04-17 ENCOUNTER — Other Ambulatory Visit: Payer: Self-pay

## 2024-04-17 MED ORDER — TRELEGY ELLIPTA 100-62.5-25 MCG/ACT IN AEPB: INHALATION_SPRAY | RESPIRATORY_TRACT | 3 refills | Status: AC

## 2024-06-07 ENCOUNTER — Ambulatory Visit: Admitting: Emergency Medicine

## 2024-09-16 ENCOUNTER — Inpatient Hospital Stay: Attending: Internal Medicine

## 2024-09-23 ENCOUNTER — Inpatient Hospital Stay: Admitting: Internal Medicine
